# Patient Record
Sex: Female | Born: 1965 | Race: White | Hispanic: No | Marital: Married | Smoking: Never smoker
Health system: Southern US, Community
[De-identification: ages and names within clinical notes are randomized; demographics above are authoritative.]

## PROBLEM LIST (undated history)

## (undated) DIAGNOSIS — K5900 Constipation, unspecified: Secondary | ICD-10-CM

## (undated) DIAGNOSIS — F32A Depression, unspecified: Secondary | ICD-10-CM

## (undated) DIAGNOSIS — K219 Gastro-esophageal reflux disease without esophagitis: Secondary | ICD-10-CM

## (undated) DIAGNOSIS — I10 Essential (primary) hypertension: Secondary | ICD-10-CM

## (undated) DIAGNOSIS — D649 Anemia, unspecified: Secondary | ICD-10-CM

## (undated) DIAGNOSIS — J45909 Unspecified asthma, uncomplicated: Secondary | ICD-10-CM

## (undated) DIAGNOSIS — J986 Disorders of diaphragm: Secondary | ICD-10-CM

## (undated) DIAGNOSIS — T7840XA Allergy, unspecified, initial encounter: Secondary | ICD-10-CM

## (undated) DIAGNOSIS — G43909 Migraine, unspecified, not intractable, without status migrainosus: Secondary | ICD-10-CM

## (undated) DIAGNOSIS — F329 Major depressive disorder, single episode, unspecified: Secondary | ICD-10-CM

## (undated) HISTORY — DX: Depression, unspecified: F32.A

## (undated) HISTORY — DX: Anemia, unspecified: D64.9

## (undated) HISTORY — DX: Unspecified asthma, uncomplicated: J45.909

## (undated) HISTORY — PX: DILATION AND CURETTAGE OF UTERUS: SHX78

## (undated) HISTORY — PX: WISDOM TOOTH EXTRACTION: SHX21

## (undated) HISTORY — DX: Essential (primary) hypertension: I10

## (undated) HISTORY — DX: Major depressive disorder, single episode, unspecified: F32.9

## (undated) HISTORY — PX: APPENDECTOMY: SHX54

## (undated) HISTORY — DX: Migraine, unspecified, not intractable, without status migrainosus: G43.909

## (undated) HISTORY — DX: Constipation, unspecified: K59.00

## (undated) HISTORY — DX: Allergy, unspecified, initial encounter: T78.40XA

---

## 2007-04-25 HISTORY — PX: TENNIS ELBOW RELEASE/NIRSCHEL PROCEDURE: SHX6651

## 2011-04-25 HISTORY — PX: KNEE ARTHROSCOPY: SUR90

## 2013-06-23 DIAGNOSIS — Z8249 Family history of ischemic heart disease and other diseases of the circulatory system: Secondary | ICD-10-CM | POA: Insufficient documentation

## 2013-06-23 DIAGNOSIS — E785 Hyperlipidemia, unspecified: Secondary | ICD-10-CM | POA: Insufficient documentation

## 2013-06-23 DIAGNOSIS — R079 Chest pain, unspecified: Secondary | ICD-10-CM | POA: Insufficient documentation

## 2013-06-23 DIAGNOSIS — G43909 Migraine, unspecified, not intractable, without status migrainosus: Secondary | ICD-10-CM | POA: Insufficient documentation

## 2013-06-23 DIAGNOSIS — F32A Depression, unspecified: Secondary | ICD-10-CM | POA: Insufficient documentation

## 2017-11-27 ENCOUNTER — Encounter: Payer: Self-pay | Admitting: Family Medicine

## 2017-12-19 ENCOUNTER — Encounter: Payer: Self-pay | Admitting: Internal Medicine

## 2017-12-19 ENCOUNTER — Ambulatory Visit: Payer: Self-pay

## 2017-12-26 ENCOUNTER — Encounter: Payer: Self-pay | Admitting: Family Medicine

## 2018-01-07 ENCOUNTER — Encounter: Payer: Self-pay | Admitting: Allergy and Immunology

## 2018-01-07 ENCOUNTER — Ambulatory Visit (INDEPENDENT_AMBULATORY_CARE_PROVIDER_SITE_OTHER): Payer: 59 | Admitting: Allergy and Immunology

## 2018-01-07 VITALS — BP 124/86 | HR 95 | Resp 16 | Ht 65.5 in | Wt 286.4 lb

## 2018-01-07 DIAGNOSIS — J310 Chronic rhinitis: Secondary | ICD-10-CM | POA: Diagnosis not present

## 2018-01-07 DIAGNOSIS — K219 Gastro-esophageal reflux disease without esophagitis: Secondary | ICD-10-CM | POA: Diagnosis not present

## 2018-01-07 DIAGNOSIS — J4540 Moderate persistent asthma, uncomplicated: Secondary | ICD-10-CM | POA: Insufficient documentation

## 2018-01-07 MED ORDER — PANTOPRAZOLE SODIUM 40 MG PO TBEC: 40.0000 mg | DELAYED_RELEASE_TABLET | Freq: Every day | ORAL | 5 refills | Status: DC

## 2018-01-07 MED ORDER — TIOTROPIUM BROMIDE MONOHYDRATE 1.25 MCG/ACT IN AERS: 2.0000 | INHALATION_SPRAY | Freq: Every day | RESPIRATORY_TRACT | 5 refills | Status: DC

## 2018-01-07 MED ORDER — BUDESONIDE-FORMOTEROL FUMARATE 160-4.5 MCG/ACT IN AERO: 2.0000 | INHALATION_SPRAY | Freq: Two times a day (BID) | RESPIRATORY_TRACT | 5 refills | Status: DC

## 2018-01-07 MED ORDER — FLUTICASONE PROPIONATE 93 MCG/ACT NA EXHU: 2.0000 | INHALANT_SUSPENSION | Freq: Two times a day (BID) | NASAL | 3 refills | Status: DC

## 2018-01-07 MED ORDER — MONTELUKAST SODIUM 10 MG PO TABS: 10.0000 mg | ORAL_TABLET | Freq: Every day | ORAL | 4 refills | Status: DC

## 2018-01-07 MED ORDER — ALBUTEROL SULFATE 108 (90 BASE) MCG/ACT IN AEPB: 2.0000 | INHALATION_SPRAY | RESPIRATORY_TRACT | 2 refills | Status: DC | PRN

## 2018-01-07 MED ORDER — CARBINOXAMINE MALEATE 4 MG PO TABS: 1.0000 | ORAL_TABLET | Freq: Three times a day (TID) | ORAL | 4 refills | Status: DC

## 2018-01-07 MED ORDER — RANITIDINE HCL 150 MG PO TABS: 150.0000 mg | ORAL_TABLET | Freq: Two times a day (BID) | ORAL | 5 refills | Status: DC

## 2018-01-07 NOTE — Assessment & Plan Note (Addendum)
Currently with suboptimal control.  For now, continue pantoprazole (Protonix) as prescribed.  A prescription has been provided for ranitidine 150 mg twice daily.  Appropriate reflux lifestyle modifications have been discussed and provided in written form.

## 2018-01-07 NOTE — Progress Notes (Signed)
New Patient Note  RE: Alyssa Clay MRN: [id number] DOB: 26-Feb-1966 Date of Office Visit: 01/07/2018  Referring provider: No ref. provider found Primary care provider: Via, Lennette Bihari, MD  Chief Complaint: Asthma; Sinus Problem; and Allergic Rhinitis    History of present illness: Alyssa Clay is a 52 y.o. female presenting today for evaluation of rhinitis and asthma. She reports that in late December 2018 she was hospitalized for 1 week for an asthma exacerbation.  She did not require intubation.  She was started on Brio Ellipta 200-25 g, 1 inhalation daily, and montelukast 10 mg daily at bedtime.  While on this regimen she has rarely required albuterol rescue and denies nocturnal awakenings due to lower respiratory symptoms.  She has had asthma-like symptoms since childhood and asthma was formally diagnosed approximately 20 years ago.  Her asthma symptoms include cold weather, upper respiratory tract infections, pollen exposure, and exercise.  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  She experiences nasal congestion, sinus pressure between the eyes, thick postnasal drainage, and "scratchy, scratchy throat".  These symptoms occur year-round but seem to be more frequent and severe in the springtime and in the fall.  When she moved to [place] from [place] she got rid of her cat based upon results of previous skin testing.  She has tried Flonase, Astelin, Xyzal, Periactin, and Claritin without adequate symptom relief.  She does not like the taste of Astelin nasal spray.  Assessment and plan: Moderate persistent asthma Todays spirometry results, assessed while asymptomatic, suggest under-perception of bronchoconstriction.  A prescription has been provided for Symbicort (budesonide/formoterol) 160/4.5 g,  2 inhalations twice a day. To maximize pulmonary deposition, a spacer has been provided along with instructions for its proper administration with an HFA inhaler.  A prescription  has been provided for Spiriva Respimat 1.25 g, 2 inhalations daily.  For now, continue montelukast 10 mg daily at bedtime and albuterol HFA, 1 to 2 inhalations every 4-6 hours if needed.  Subjective and objective measures of pulmonary function will be followed and the treatment plan will be adjusted accordingly.  Chronic rhinitis All seasonal and perennial aeroallergen skin tests are negative despite a positive histamine control.  Intranasal steroids, intranasal antihistamines, and first generation antihistamines are effective for symptoms associated with non-allergic rhinitis, whereas second generation antihistamines such as cetirizine (Zyrtec), loratadine (Claritin) and fexofenadine (Allegra) have been found to be ineffective for this condition.  As the patient has had positive skin test in the past to cat, dog, mold, and dust mite, we will confirm today's skin test results with serum specific IgE.  Laboratory order form has been provided for serum specific IgE against zone 2 environmental panel.  A prescription has been provided for [facility], 2 actuations per nostril twice a day. Proper technique has been discussed and demonstrated.  Nasal saline lavage (NeilMed) has been recommended as needed and prior to medicated nasal sprays along with instructions for proper administration.  A prescription has been provided for carbinoxamine maleate 4mg  every 8 hours as needed.  GERD (gastroesophageal reflux disease) Currently with suboptimal control.  For now, continue pantoprazole (Protonix) as prescribed.  A prescription has been provided for ranitidine 150 mg twice daily.  Appropriate reflux lifestyle modifications have been discussed and provided in written form.   Meds ordered this encounter  Medications  . budesonide-formoterol (SYMBICORT) 160-4.5 MCG/ACT inhaler    Sig: Inhale 2 puffs into the lungs 2 (two) times daily.    Dispense:  1 Inhaler  Refill:  5  . Tiotropium Bromide  Monohydrate (SPIRIVA RESPIMAT) 1.25 MCG/ACT AERS    Sig: Inhale 2 puffs into the lungs daily.    Dispense:  4 g    Refill:  5  . DISCONTD: Fluticasone Propionate (XHANCE) 93 MCG/ACT EXHU    Sig: Place 2 sprays into the nose 2 (two) times daily.    Dispense:  16 mL    Refill:  3  . Carbinoxamine Maleate 4 MG TABS    Sig: Take 1 tablet (4 mg total) by mouth every 8 (eight) hours.    Dispense:  28 each    Refill:  4  . ranitidine (ZANTAC) 150 MG tablet    Sig: Take 1 tablet (150 mg total) by mouth 2 (two) times daily.    Dispense:  60 tablet    Refill:  5  . pantoprazole (PROTONIX) 40 MG tablet    Sig: Take 1 tablet (40 mg total) by mouth daily.    Dispense:  30 tablet    Refill:  5  . montelukast (SINGULAIR) 10 MG tablet    Sig: Take 1 tablet (10 mg total) by mouth daily.    Dispense:  30 tablet    Refill:  4  . Albuterol Sulfate 108 (90 Base) MCG/ACT AEPB    Sig: Inhale 2 puffs into the lungs as needed.    Dispense:  1 each    Refill:  2  . Fluticasone Propionate (XHANCE) 93 MCG/ACT EXHU    Sig: Place 2 sprays into the nose 2 (two) times daily.    Dispense:  16 mL    Refill:  3    [phone]    Diagnostics: Spirometry: FVC was 1.84 L and FEV1 was 1.42 L (49% predicted) with significant (790 mL, 56%) postbronchodilator improvement.  This study was performed while the patient was asymptomatic.  Please see scanned spirometry results for details. Epicutaneous testing: Negative despite a positive histamine control. Intradermal testing: Negative despite a positive histamine control.    Physical examination: Blood pressure 124/86, pulse 95, resp. rate 16, height 5' 5.5" (1.664 m), weight 286 lb 6.4 oz (129.9 kg), last menstrual period 12/17/2017, SpO2 100 %.  General: Alert, interactive, in no acute distress. HEENT: TMs pearly gray, turbinates edematous without discharge, post-pharynx moderately erythematous. Neck: Supple without lymphadenopathy. Lungs: Clear to auscultation  without wheezing, rhonchi or rales. CV: Normal S1, S2 without murmurs. Abdomen: Nondistended, nontender. Skin: Warm and dry, without lesions or rashes. Extremities:  No clubbing, cyanosis or edema. Neuro:   Grossly intact.  Review of systems:  Review of systems negative except as noted in HPI / PMHx or noted below: Review of Systems  Constitutional: Negative.   HENT: Negative.   Eyes: Negative.   Respiratory: Negative.   Cardiovascular: Negative.   Gastrointestinal: Negative.   Genitourinary: Negative.   Musculoskeletal: Negative.   Skin: Negative.   Neurological: Negative.   Endo/Heme/Allergies: Negative.   Psychiatric/Behavioral: Negative.     Past medical history:  Past Medical History:  Diagnosis Date  . Asthma   . Depression   . Hypertension   . Migraine     Past surgical history:  Reviewed.  No pertinent surgical history reported.  Family history: No significant or contributory family history has been reported.  Social history: Social History   Socioeconomic History  . Marital status: Married    Spouse name: Not on file  . Number of children: Not on file  . Years of education: Not on file  .  Highest education level: Not on file  Occupational History  . Not on file  Social Needs  . Financial resource strain: Not on file  . Food insecurity:    Worry: Not on file    Inability: Not on file  . Transportation needs:    Medical: Not on file    Non-medical: Not on file  Tobacco Use  . Smoking status: Never Smoker  . Smokeless tobacco: Never Used  Substance and Sexual Activity  . Alcohol use: Yes    Frequency: Never    Comment: occ  . Drug use: Never  . Sexual activity: Not on file  Lifestyle  . Physical activity:    Days per week: Not on file    Minutes per session: Not on file  . Stress: Not on file  Relationships  . Social connections:    Talks on phone: Not on file    Gets together: Not on file    Attends religious service: Not on file     Active member of club or organization: Not on file    Attends meetings of clubs or organizations: Not on file    Relationship status: Not on file  . Intimate partner violence:    Fear of current or ex partner: Not on file    Emotionally abused: Not on file    Physically abused: Not on file    Forced sexual activity: Not on file  Other Topics Concern  . Not on file  Social History Narrative  . Not on file   Environmental History: The patient lives in a house built in [place] with hardwood floors throughout and central air/heat.  She is a non-smoker without pets. She was exposed to second hand cigarette smoke in the home when she was growing up  There is no known mold/water damage in the home.  Allergies as of 01/07/2018      Reactions   Penicillins Other (See Comments)   Yeast infections Yeast infections      Medication List        Accurate as of 01/07/18 12:30 PM. Always use your most recent med list.          AIMOVIG 70 MG/ML Soaj Generic drug:  Erenumab-aooe Inject into the skin.   albuterol (2.5 MG/3ML) 0.083% nebulizer solution Commonly known as:  PROVENTIL Take 2.5 mg by nebulization every 6 (six) hours as needed for wheezing or shortness of breath.   Albuterol Sulfate 108 (90 Base) MCG/ACT Aepb Inhale 2 puffs into the lungs as needed.   BREO ELLIPTA 200-25 MCG/INH Aepb Generic drug:  fluticasone furoate-vilanterol Inhale 1 puff into the lungs daily.   budesonide-formoterol 160-4.5 MCG/ACT inhaler Commonly known as:  SYMBICORT Inhale 2 puffs into the lungs 2 (two) times daily.   calcium carbonate 1500 (600 Ca) MG Tabs tablet Commonly known as:  OSCAL Take 600 mg of elemental calcium by mouth 2 (two) times daily with a meal.   Carbinoxamine Maleate 4 MG Tabs Take 1 tablet (4 mg total) by mouth every 8 (eight) hours.   cyclobenzaprine 10 MG tablet Commonly known as:  FLEXERIL Take 10 mg by mouth 3 (three) times daily as needed for muscle spasms.   DULoxetine  60 MG capsule Commonly known as:  CYMBALTA Take 60 mg by mouth daily.   Fluticasone Propionate 93 MCG/ACT Exhu Place 2 sprays into the nose 2 (two) times daily.   gabapentin 300 MG capsule Commonly known as:  NEURONTIN   lisinopril 20 MG tablet  Commonly known as:  PRINIVIL,ZESTRIL Take 20 mg by mouth daily.   MAXALT 10 MG tablet Generic drug:  rizatriptan Take 10 mg by mouth as needed for migraine. May repeat in 2 hours if needed   MIGRELIEF 200-180-50 MG Tabs Generic drug:  Riboflavin-Magnesium-Feverfew Take by mouth.   montelukast 10 MG tablet Commonly known as:  SINGULAIR Take 1 tablet (10 mg total) by mouth daily.   multivitamin with minerals Tabs tablet Take 1 tablet by mouth daily.   pantoprazole 40 MG tablet Commonly known as:  PROTONIX Take 1 tablet (40 mg total) by mouth daily.   PROBIOTIC PO Take 1 capsule by mouth daily.   promethazine 25 MG tablet Commonly known as:  PHENERGAN Take 25 mg by mouth every 6 (six) hours as needed for nausea or vomiting.   ranitidine 150 MG tablet Commonly known as:  ZANTAC Take 1 tablet (150 mg total) by mouth 2 (two) times daily.   Tiotropium Bromide Monohydrate 1.25 MCG/ACT Aers Inhale 2 puffs into the lungs daily.   traZODone 100 MG tablet Commonly known as:  DESYREL Take 100 mg by mouth at bedtime.   valACYclovir 1000 MG tablet Commonly known as:  VALTREX Take 1,000 mg by mouth 2 (two) times daily.       Known medication allergies: Allergies  Allergen Reactions  . Penicillins Other (See Comments)    Yeast infections Yeast infections     I appreciate the opportunity to take part in [facility] care. Please do not hesitate to contact me with questions.  Sincerely,   R. Edgar Frisk, MD

## 2018-01-07 NOTE — Assessment & Plan Note (Signed)
Todays spirometry results, assessed while asymptomatic, suggest under-perception of bronchoconstriction.  A prescription has been provided for Symbicort (budesonide/formoterol) 160/4.5 g, 2 inhalations twice a day. To maximize pulmonary deposition, a spacer has been provided along with instructions for its proper administration with an HFA inhaler.  A prescription has been provided for Spiriva Respimat 1.25 g, 2 inhalations daily.  For now, continue montelukast 10 mg daily at bedtime and albuterol HFA, 1 to 2 inhalations every 4-6 hours if needed.  Subjective and objective measures of pulmonary function will be followed and the treatment plan will be adjusted accordingly.

## 2018-01-07 NOTE — Patient Instructions (Addendum)
Moderate persistent asthma Todays spirometry results, assessed while asymptomatic, suggest under-perception of bronchoconstriction.  A prescription has been provided for Symbicort (budesonide/formoterol) 160/4.5 g,  2 inhalations twice a day. To maximize pulmonary deposition, a spacer has been provided along with instructions for its proper administration with an HFA inhaler.  A prescription has been provided for Spiriva Respimat 1.25 g, 2 inhalations daily.  For now, continue montelukast 10 mg daily at bedtime and albuterol HFA, 1 to 2 inhalations every 4-6 hours if needed.  Subjective and objective measures of pulmonary function will be followed and the treatment plan will be adjusted accordingly.  Chronic rhinitis All seasonal and perennial aeroallergen skin tests are negative despite a positive histamine control.  Intranasal steroids, intranasal antihistamines, and first generation antihistamines are effective for symptoms associated with non-allergic rhinitis, whereas second generation antihistamines such as cetirizine (Zyrtec), loratadine (Claritin) and fexofenadine (Allegra) have been found to be ineffective for this condition.  As the patient has had positive skin test in the past to cat, dog, mold, and dust mite, we will confirm today's skin test results with serum specific IgE.  Laboratory order form has been provided for serum specific IgE against zone 2 environmental panel.  A prescription has been provided for [facility], 2 actuations per nostril twice a day. Proper technique has been discussed and demonstrated.  Nasal saline lavage (NeilMed) has been recommended as needed and prior to medicated nasal sprays along with instructions for proper administration.  A prescription has been provided for carbinoxamine maleate 4mg  every 8 hours as needed.  GERD (gastroesophageal reflux disease) Currently with suboptimal control.  For now, continue pantoprazole (Protonix) as  prescribed.  A prescription has been provided for ranitidine 150 mg twice daily.  Appropriate reflux lifestyle modifications have been discussed and provided in written form.   Return in about 3 months (around 04/08/2018), or if symptoms worsen or fail to improve.  Lifestyle Changes for Controlling GERD  When you have GERD, stomach acid feels as if it's backing up toward your mouth. Whether or not you take medication to control your GERD, your symptoms can often be improved with lifestyle changes.   Raise Your Head  Reflux is more likely to strike when you're lying down flat, because stomach fluid can  flow backward more easily. Raising the head of your bed 4-6 inches can help. To do this:  Slide blocks or books under the legs at the head of your bed. Or, place a wedge under  the mattress. Many foam stores can make a suitable wedge for you. The wedge  should run from your waist to the top of your head.  Don't just prop your head on several pillows. This increases pressure on your  stomach. It can make GERD worse.  Watch Your Eating Habits Certain foods may increase the acid in your stomach or relax the lower esophageal sphincter, making GERD more likely. It's best to avoid the following:  Coffee, tea, and carbonated drinks (with and without caffeine)  Fatty, fried, or spicy food  Mint, chocolate, onions, and tomatoes  Any other foods that seem to irritate your stomach or cause you pain  Relieve the Pressure  Eat smaller meals, even if you have to eat more often.  Don't lie down right after you eat. Wait a few hours for your stomach to empty.  Avoid tight belts and tight-fitting clothes.  Lose excess weight.  Tobacco and Alcohol  Avoid smoking tobacco and drinking alcohol. They can make GERD symptoms worse.

## 2018-01-07 NOTE — Assessment & Plan Note (Addendum)
All seasonal and perennial aeroallergen skin tests are negative despite a positive histamine control.  Intranasal steroids, intranasal antihistamines, and first generation antihistamines are effective for symptoms associated with non-allergic rhinitis, whereas second generation antihistamines such as cetirizine (Zyrtec), loratadine (Claritin) and fexofenadine (Allegra) have been found to be ineffective for this condition.  As the patient has had positive skin test in the past to cat, dog, mold, and dust mite, we will confirm today's skin test results with serum specific IgE.  Laboratory order form has been provided for serum specific IgE against zone 2 environmental panel.  A prescription has been provided for [facility], 2 actuations per nostril twice a day. Proper technique has been discussed and demonstrated.  Nasal saline lavage (NeilMed) has been recommended as needed and prior to medicated nasal sprays along with instructions for proper administration.  A prescription has been provided for carbinoxamine maleate 4mg  every 8 hours as needed.

## 2018-01-10 ENCOUNTER — Institutional Professional Consult (permissible substitution): Payer: Self-pay | Admitting: Internal Medicine

## 2018-01-10 LAB — ALLERGENS W/TOTAL IGE AREA 2

## 2018-01-23 ENCOUNTER — Encounter: Payer: Self-pay | Admitting: Registered"

## 2018-01-23 ENCOUNTER — Encounter: Payer: 59 | Attending: Family Medicine | Admitting: Registered"

## 2018-01-23 DIAGNOSIS — Z6841 Body Mass Index (BMI) 40.0 and over, adult: Secondary | ICD-10-CM | POA: Insufficient documentation

## 2018-01-23 DIAGNOSIS — E669 Obesity, unspecified: Secondary | ICD-10-CM | POA: Insufficient documentation

## 2018-01-23 DIAGNOSIS — Z713 Dietary counseling and surveillance: Secondary | ICD-10-CM | POA: Insufficient documentation

## 2018-01-23 NOTE — Progress Notes (Signed)
Medical Nutrition Therapy:  Appt start time: 1113 end time:  1220.  Assessment:  Primary concerns today: Pt referred for weight management. Pt reports that she gained 40 lb this past December due to illness. Pt reports she has tried diets in the past such as the [place] but was unable to maintain it. She reports her last lab work came back in the prediabetes range with HgbA1c of 6.4. Pt would like information on how to prevent development of type 2 diabetes. Pt reports that she is currently on an elimination diet to assess if certain foods may be contributing to migraines.  She has been avoiding nuts, chocolate, white bread/refined grains, and processed foods as part of elimination diet. Pt reports she has one more week until she starts adding foods back in to diet. Pt reports that she does not drink soft drinks. Mainly drinks decaffeinated coffee, water, sometimes beer. Usually gets between 3-8 hours of sleep per night. Reports that her breathing difficulties make getting in adequate sleep difficult sometimes.   Preferred Learning Style:   No preference indicated   Learning Readiness:   Ready  MEDICATIONS: See list. Reviewed.    DIETARY INTAKE:  Usual eating pattern includes 3 meals and 2 snacks per day. Fruit, popcorn, wheat crackers and cheese. Meals eaten at home are usually eaten at the table, sometimes breakfast may be eaten in front of TV.    Common foods include decaffeinated coffee, berries, popcorn, scramble eggs, oatmeal.  Avoided foods include avoiding nuts, chocolate, white bread/refined grains, and processed foods to temporarily to assess if they could be causing migraines; yogurt, milk, onions, most vegetables. Likes spinach, peas, green beans, cooked carrots, asparagus, broccoli, sweet potatoes.      24-hr recall:  B ( AM): 2 scrambled eggs, 1 piece of whole wheat toast, 1 cup strawberries/blueberries, 2 cups of decaffeinated coffee and creamer  Snk ( AM): None  reported.  L ( PM): hamburger without bun, lettuce, cherry tomatoes, champagne dressing, water   Snk ( PM): 6 wheat crackers and 3 pieces of cheese, water   D ( PM): tilapia, green beans, water  Snk ( PM): homemade popcorn with olive oil and sea salt  Beverages: typically has 6-8 glasses of water daily; 2 cups decaffeinated coffee with creamer  Usual physical activity: Average of walking 10 minutes about 3 times per week. Plans to start including more physical activity since getting inhaler adjusted.   Progress Towards Goal(s):  In progress.   Nutritional Diagnosis:  NB-1.1 Food and nutrition-related knowledge deficit As related to relationship between blood sugar management/insulin resistance and dietary intake.  As evidenced by pt has questions regarding how she should eat to help prevent development of diabetes.    Intervention:  Nutrition counseling provided. Dietitian provided education regarding the relationship between blood sugar management/insulin resistance and dietary intake. Provided education on balanced nutrition and benefits of physical activity on blood sugar management. Discussed carbohydrate counting as a way to ensure we are including a consistent amount of carbohydrates at meals as pt was unsure about having carbohydrates at meals. Worked with pt to set a short-term physical activity goal. Pt appeared agreeable to information/goals discussed.   Instructions/Goals: Make sure to get in three meals per day. Try to have balanced meals like the My Plate example (see handout). Include lean proteins, vegetables, fruits, and whole grains at meals.   Include a consistent amount of carbohydrates at each meal. Recommend around 2-3 carbohydrate choices per meal (30-45 g  per meal).   Continue staying well hydrated. Recommend at least 64 oz water per day.   Make physical activity a part of your week. Try to include at least 30 minutes of physical activity 5 days each week or at least 150  minutes per week. Regular physical activity promotes overall health-including helping to reduce risk for heart disease and diabetes, promoting mental health, and helping [place] sleep better.    Starting Goal: At least 15 minutes 3-4 days per week.   Teaching Method Utilized:  Visual Auditory  Handouts given during visit include:  Balanced plate and food list.   Carbohydrate Counting Card  Snack Sheet  Barriers to learning/adherence to lifestyle change: None indicated.   Demonstrated degree of understanding via:  Teach Back   Monitoring/Evaluation:  Dietary intake, exercise, and body weight in 1 month(s).

## 2018-01-23 NOTE — Patient Instructions (Signed)
Instructions/Goals: Make sure to get in three meals per day. Try to have balanced meals like the My Plate example (see handout). Include lean proteins, vegetables, fruits, and whole grains at meals.   Include a consistent amount of carbohydrates at each meal. Recommend around 2-3 carbohydrate choices per meal (30-45 g per meal).   Continue staying well hydrated. Recommend at least 64 oz water per day.   Make physical activity a part of your week. Try to include at least 30 minutes of physical activity 5 days each week or at least 150 minutes per week. Regular physical activity promotes overall health-including helping to reduce risk for heart disease and diabetes, promoting mental health, and helping [place] sleep better.    Starting Goal: At least 15 minutes 3-4 days per week.

## 2018-01-24 ENCOUNTER — Ambulatory Visit (INDEPENDENT_AMBULATORY_CARE_PROVIDER_SITE_OTHER): Payer: 59 | Admitting: Family Medicine

## 2018-01-24 ENCOUNTER — Encounter: Payer: Self-pay | Admitting: Family Medicine

## 2018-01-24 VITALS — BP 130/78 | HR 105 | Temp 98.2°F | Ht 66.0 in | Wt 285.8 lb

## 2018-01-24 DIAGNOSIS — J986 Disorders of diaphragm: Secondary | ICD-10-CM

## 2018-01-24 DIAGNOSIS — Z13220 Encounter for screening for lipoid disorders: Secondary | ICD-10-CM

## 2018-01-24 DIAGNOSIS — Z23 Encounter for immunization: Secondary | ICD-10-CM | POA: Diagnosis not present

## 2018-01-24 DIAGNOSIS — R7301 Impaired fasting glucose: Secondary | ICD-10-CM

## 2018-01-24 DIAGNOSIS — D72829 Elevated white blood cell count, unspecified: Secondary | ICD-10-CM | POA: Diagnosis not present

## 2018-01-24 DIAGNOSIS — K219 Gastro-esophageal reflux disease without esophagitis: Secondary | ICD-10-CM | POA: Diagnosis not present

## 2018-01-24 DIAGNOSIS — Z1211 Encounter for screening for malignant neoplasm of colon: Secondary | ICD-10-CM | POA: Diagnosis not present

## 2018-01-24 MED ORDER — ALBUTEROL SULFATE 108 (90 BASE) MCG/ACT IN AEPB: 2.0000 | INHALATION_SPRAY | RESPIRATORY_TRACT | 5 refills | Status: DC | PRN

## 2018-01-24 NOTE — Progress Notes (Signed)
Alyssa Clay DOB: [birth year] Encounter date: 01/24/2018  This isa 52 y.o. female who presents to establish care. Chief Complaint  Patient presents with  . New Patient (Initial Visit)    prediabetic at previous pcp, blood work?    History of present illness: A1C 6.4; TSH 1.25;   Fasting glucose 90; BUN/Crea 8/0.81 electtrolytes normal; protein, liver normal WBC 12.6  RF negative CRP negative/normal ANA negative Sed rate 21  Worried about prediabetes. Has cut out processed sugar. Wanting to lose weight. Doing more whole grains, veggies, fruits. On scale at home has lost 6-7 lbs. Just started exercising last week. Started doing ellyptical, stationary bike, treadmill.   Has asthma: When she gets sick it hits her very hard. Goes right to lungs. Parents smoked in house when she was little. She has never smoked herself.   Migraines: Started aimovig which helps; following migraine diet. Avoiding triggers.   Just saw allergist and got more inhalers/asthma treatment.   Has had high blood pressure since 20's. Does check bp at home. Usually runs around 130/78 (similar to today).   GERD: Still symptomatic even with dietary changes. Takes protonix regularly. Doing both zantac and protonix. Past couple of days has seemed to flare more. Has not seen a GI specialist.   Depression: Not best relationship with mom. Mood worse in last 5 years; worked with therapist which really helped her. Biggest help was setting limits with mom. Also happier to be in [place] area. Takes cymbalta for mood.   Would like to sleep better. Does well on the trazodone. Minimal snoring. No one has ever told her she stops breathing. When not coughing all night sleeps better.     Past Medical History:  Diagnosis Date  . Asthma   . Depression   . Hypertension   . Migraine    Past Surgical History:  Procedure Laterality Date  . CESAREAN SECTION  1994  . DILATION AND CURETTAGE OF UTERUS     multiple  due to miscarriages  . KNEE ARTHROSCOPY Left 2013  . TENNIS ELBOW RELEASE/NIRSCHEL PROCEDURE  2009   No Active Allergies Current Meds  Medication Sig  . albuterol (PROVENTIL) (2.5 MG/3ML) 0.083% nebulizer solution Take 2.5 mg by nebulization every 6 (six) hours as needed for wheezing or shortness of breath.  . budesonide-formoterol (SYMBICORT) 160-4.5 MCG/ACT inhaler Inhale 2 puffs into the lungs 2 (two) times daily.  . calcium carbonate (OSCAL) 1500 (600 Ca) MG TABS tablet Take 600 mg of elemental calcium by mouth 2 (two) times daily with a meal.  . Carbinoxamine Maleate 4 MG TABS Take 1 tablet (4 mg total) by mouth every 8 (eight) hours.  . cyclobenzaprine (FLEXERIL) 10 MG tablet Take 10 mg by mouth 3 (three) times daily as needed for muscle spasms.  . DULoxetine (CYMBALTA) 60 MG capsule Take 60 mg by mouth daily.  Eduard Roux (AIMOVIG) 70 MG/ML SOAJ Inject into the skin.  Marland Kitchen Fluticasone Propionate (XHANCE) 93 MCG/ACT EXHU Place 2 sprays into the nose 2 (two) times daily.  Marland Kitchen gabapentin (NEURONTIN) 300 MG capsule   . lisinopril (PRINIVIL,ZESTRIL) 20 MG tablet Take 20 mg by mouth daily.  . montelukast (SINGULAIR) 10 MG tablet Take 1 tablet (10 mg total) by mouth daily.  . Multiple Vitamin (MULTIVITAMIN WITH MINERALS) TABS tablet Take 1 tablet by mouth daily.  . pantoprazole (PROTONIX) 40 MG tablet Take 1 tablet (40 mg total) by mouth daily.  . Probiotic Product (PROBIOTIC PO) Take 1 capsule by mouth daily.  Marland Kitchen  ranitidine (ZANTAC) 150 MG tablet Take 1 tablet (150 mg total) by mouth 2 (two) times daily.  . Riboflavin-Magnesium-Feverfew (MIGRELIEF) 200-180-50 MG TABS Take by mouth.  . rizatriptan (MAXALT) 10 MG tablet Take 10 mg by mouth as needed for migraine. May repeat in 2 hours if needed  . Tiotropium Bromide Monohydrate (SPIRIVA RESPIMAT) 1.25 MCG/ACT AERS Inhale 2 puffs into the lungs daily.  . traZODone (DESYREL) 100 MG tablet Take 100 mg by mouth at bedtime.  . triamcinolone cream  (KENALOG) 0.1 % Apply 1 application topically daily as needed.  . valACYclovir (VALTREX) 1000 MG tablet Take 1,000 mg by mouth 2 (two) times daily.   Social History   Tobacco Use  . Smoking status: Never Smoker  . Smokeless tobacco: Never Used  Substance Use Topics  . Alcohol use: Yes    Frequency: Never    Comment: occ   Family History  Problem Relation Age of Onset  . Cancer Mother 64       lymphoma  . Depression Mother   . Hypertension Father   . Heart attack Father 4  . Hypertension Brother   . High blood pressure Brother   . High Cholesterol Brother      Review of Systems  Constitutional: Negative for chills, fatigue and fever.  HENT: Positive for congestion (difficult clearing congestion).   Respiratory: Negative for cough, chest tightness, shortness of breath and wheezing (stable right now).   Cardiovascular: Negative for chest pain, palpitations and leg swelling.  Psychiatric/Behavioral: Positive for sleep disturbance.    Objective:  BP 130/78 (BP Location: Left Arm, Patient Position: Sitting, Cuff Size: Large)   Pulse (!) 105   Temp 98.2 F (36.8 C) (Oral)   Ht 5\' 6"  (1.676 m)   Wt 285 lb 12.8 oz (129.6 kg)   SpO2 94%   BMI 46.13 kg/m   Weight: 285 lb 12.8 oz (129.6 kg)   BP Readings from Last 3 Encounters:  01/24/18 130/78  01/07/18 124/86   Wt Readings from Last 3 Encounters:  01/24/18 285 lb 12.8 oz (129.6 kg)  01/23/18 286 lb 11.2 oz (130 kg)  01/07/18 286 lb 6.4 oz (129.9 kg)    Physical Exam  Constitutional: She is oriented to person, place, and time. She appears well-developed and well-nourished. No distress.  Cardiovascular: Normal rate, regular rhythm and normal heart sounds. Exam reveals no friction rub.  No murmur heard. No lower extremity edema  Pulmonary/Chest: Effort normal and breath sounds normal. No respiratory distress. She has no wheezes. She has no rales.  Abdominal: Soft. Bowel sounds are normal. She exhibits no distension.  There is no tenderness.  Neurological: She is alert and oriented to person, place, and time.  Psychiatric: Her behavior is normal. Cognition and memory are normal.    Assessment/Plan: 1. Gastroesophageal reflux disease, esophagitis presence not specified Uncontrolled on duel therapy. Will get GI opinion. - Ambulatory referral to Gastroenterology  2. Screening for colon cancer - Ambulatory referral to Gastroenterology  3. Leukocytosis, unspecified type - CBC with Differential/Platelet; Future  4. Impaired fasting glucose Continue with exercise and healthier eating. Plan to recheck A1C in 2 months time. - Hemoglobin A1c; Future  5. Diaphragm dysfunction Chronic condition.   6. Lipid screening - Lipid panel; Future  7. Need for pneumococcal vaccination Completed today.  Return in about 2 months (around 03/26/2018).  Micheline Rough, MD

## 2018-02-06 NOTE — Progress Notes (Signed)
Alyssa Clay DOB: January 23, 1966 Encounter date: 02/07/2018  This is a 52 y.o. female who presents with Chief Complaint  Patient presents with  . Hand Pain    both hands, x3 weeks, hands and fingers, 9/10 on pain scale at some times, pt states she has had the pain on and off for a few years but it has gotten worse over the past 3 weeks    History of present illness:  In past year fingers will feel achy off and on. In last couple of weeks has had pain over back of hands that is severe 9/10. Not necessarily swelling although she does swell sometimes. Tried aleve which didn't help. Pain not worse with activity.   Pain level right now is 2/10. Coming and going. But generally present to some degree this whole time.   Knees have been achy which is new in last couple of months.   Nothing she did that was different that may have aggravated hands.   No fevers, chills, night sweats.    She does note that in past 2 years she has difficulty getting going in the morning. Overall body is somewhat achy; takes her 1-2 hours to feel like she is "fully functioning". Not limited to specific joint, just more of an overall fatigue. Attributed to weight gain.   No Known Allergies Current Meds  Medication Sig  . albuterol (PROVENTIL) (2.5 MG/3ML) 0.083% nebulizer solution Take 2.5 mg by nebulization every 6 (six) hours as needed for wheezing or shortness of breath.  . Albuterol Sulfate (PROAIR RESPICLICK) 762 (90 Base) MCG/ACT AEPB Inhale 2 puffs into the lungs every 4 (four) hours as needed.  . budesonide-formoterol (SYMBICORT) 160-4.5 MCG/ACT inhaler Inhale 2 puffs into the lungs 2 (two) times daily.  . calcium carbonate (OSCAL) 1500 (600 Ca) MG TABS tablet Take 600 mg of elemental calcium by mouth 2 (two) times daily with a meal.  . cyclobenzaprine (FLEXERIL) 10 MG tablet Take 10 mg by mouth 3 (three) times daily as needed for muscle spasms.  . DULoxetine (CYMBALTA) 60 MG capsule Take 60 mg by  mouth daily.  Eduard Roux (AIMOVIG) 70 MG/ML SOAJ Inject into the skin.  Marland Kitchen Fluticasone Propionate (XHANCE) 93 MCG/ACT EXHU Place 2 sprays into the nose 2 (two) times daily.  Marland Kitchen gabapentin (NEURONTIN) 300 MG capsule   . lisinopril (PRINIVIL,ZESTRIL) 20 MG tablet Take 20 mg by mouth daily.  . montelukast (SINGULAIR) 10 MG tablet Take 1 tablet (10 mg total) by mouth daily.  . Multiple Vitamin (MULTIVITAMIN WITH MINERALS) TABS tablet Take 1 tablet by mouth daily.  . pantoprazole (PROTONIX) 40 MG tablet Take 1 tablet (40 mg total) by mouth daily.  . Probiotic Product (PROBIOTIC PO) Take 1 capsule by mouth daily.  . ranitidine (ZANTAC) 150 MG tablet Take 1 tablet (150 mg total) by mouth 2 (two) times daily.  . Riboflavin-Magnesium-Feverfew (MIGRELIEF) 200-180-50 MG TABS Take by mouth.  . rizatriptan (MAXALT) 10 MG tablet Take 10 mg by mouth as needed for migraine. May repeat in 2 hours if needed  . Tiotropium Bromide Monohydrate (SPIRIVA RESPIMAT) 1.25 MCG/ACT AERS Inhale 2 puffs into the lungs daily.  . traZODone (DESYREL) 100 MG tablet Take 100 mg by mouth at bedtime.  . triamcinolone cream (KENALOG) 0.1 % Apply 1 application topically daily as needed.  . valACYclovir (VALTREX) 1000 MG tablet Take 1,000 mg by mouth 2 (two) times daily.  . [DISCONTINUED] Carbinoxamine Maleate 4 MG TABS Take 1 tablet (4 mg total) by mouth  every 8 (eight) hours.    Review of Systems  Constitutional: Negative for chills, fatigue and fever.  HENT: Negative for congestion.   Respiratory: Negative for cough, chest tightness, shortness of breath and wheezing.   Cardiovascular: Negative for chest pain, palpitations and leg swelling.  Musculoskeletal: Negative for arthralgias, back pain, joint swelling and myalgias.       Pain is really in bones of hands; not symmetric bones: left hand 5th metacarpal and right hand 2nd metacarpal.  Skin:       Chronic skin changes from dermatological issue; hyperpigmented  macules/scarring over forearms, hands  Hematological: Negative for adenopathy.    Objective:  BP 124/66 (BP Location: Left Arm, Patient Position: Sitting, Cuff Size: Large)   Pulse (!) 116   Temp 98.2 F (36.8 C) (Oral)   Wt 282 lb 4.8 oz (128.1 kg)   SpO2 94%   BMI 45.56 kg/m   Weight: 282 lb 4.8 oz (128.1 kg)   BP Readings from Last 3 Encounters:  02/07/18 124/66  01/24/18 130/78  01/07/18 124/86   Wt Readings from Last 3 Encounters:  02/07/18 282 lb 4.8 oz (128.1 kg)  01/24/18 285 lb 12.8 oz (129.6 kg)  01/23/18 286 lb 11.2 oz (130 kg)    Physical Exam  Constitutional: She is oriented to person, place, and time. She appears well-developed and well-nourished. No distress.  Neck: Neck supple. No thyromegaly present.  Cardiovascular: Normal rate, regular rhythm and normal heart sounds. Exam reveals no friction rub.  No murmur heard. No lower extremity edema  Pulmonary/Chest: Effort normal and breath sounds normal. No respiratory distress. She has no wheezes. She has no rales.  Musculoskeletal:  There is bony tenderness over 5th metacarpal left and 2nd right hands. There is not tenderness of joints with palpation or notable joint swelling. There is subtle edema over 5th left metacarpal when compared to right. No bruising or overlying skin change.   Lymphadenopathy:    She has no cervical adenopathy.  Neurological: She is alert and oriented to person, place, and time.  Psychiatric: Her behavior is normal. Cognition and memory are normal.    Assessment/Plan  1. Bilateral hand pain Uncertain etiology and unusual presentation. Pain is not within joints but appeared rather suddenly and with severity. She additionally has some chronic complaints that may indicate more autoimmune presence. I think worthwhile to further evaluate due to severity. She is trying to avoid anti-inflammatories due to concerns for migraine rebound headache trigger and trying to avoid steroids due to blood  sugar elevation which makes treatment more difficult. So, single depo in office today; advised to see how symptoms progress over weekend and further evaluation pending results of labs/imaging.  - CBC with Differential/Platelet; Future - Sedimentation rate; Future - C-reactive protein; Future - ANA; Future - Rheumatoid factor; Future - DG Hand Complete Left; Future - DG Hand Complete Right; Future - methylPREDNISolone acetate (DEPO-MEDROL) injection 80 mg - Rheumatoid factor - ANA - C-reactive protein - Sedimentation rate - CBC with Differential/Platelet  2. Lipid screening - waiting until December to perform due to lifestyle changes/improvement - Lipid panel  3. Impaired fasting glucose- plan to recheck in December - Hemoglobin A1c  4. Leukocytosis, unspecified type - CBC with Differential/Platelet    Return pending xray/bloodwork.     Micheline Rough, MD

## 2018-02-07 ENCOUNTER — Ambulatory Visit (INDEPENDENT_AMBULATORY_CARE_PROVIDER_SITE_OTHER): Payer: 59 | Admitting: Family Medicine

## 2018-02-07 ENCOUNTER — Encounter: Payer: Self-pay | Admitting: Family Medicine

## 2018-02-07 VITALS — BP 124/66 | HR 116 | Temp 98.2°F | Wt 282.3 lb

## 2018-02-07 DIAGNOSIS — R7301 Impaired fasting glucose: Secondary | ICD-10-CM | POA: Diagnosis not present

## 2018-02-07 DIAGNOSIS — Z13220 Encounter for screening for lipoid disorders: Secondary | ICD-10-CM | POA: Diagnosis not present

## 2018-02-07 DIAGNOSIS — M79641 Pain in right hand: Secondary | ICD-10-CM | POA: Diagnosis not present

## 2018-02-07 DIAGNOSIS — D72829 Elevated white blood cell count, unspecified: Secondary | ICD-10-CM | POA: Diagnosis not present

## 2018-02-07 DIAGNOSIS — M79642 Pain in left hand: Secondary | ICD-10-CM | POA: Diagnosis not present

## 2018-02-07 LAB — SEDIMENTATION RATE: Sed Rate: 27 mm/hr (ref 0–30)

## 2018-02-07 LAB — CBC WITH DIFFERENTIAL/PLATELET
BASOS ABS: 0.1 10*3/uL (ref 0.0–0.1)
Basophils Relative: 1.3 % (ref 0.0–3.0)
EOS ABS: 0.2 10*3/uL (ref 0.0–0.7)
Eosinophils Relative: 2.4 % (ref 0.0–5.0)
HEMATOCRIT: 43.7 % (ref 36.0–46.0)
HEMOGLOBIN: 14.3 g/dL (ref 12.0–15.0)
LYMPHS PCT: 20.4 % (ref 12.0–46.0)
Lymphs Abs: 1.7 10*3/uL (ref 0.7–4.0)
MCHC: 32.8 g/dL (ref 30.0–36.0)
MCV: 91.1 fl (ref 78.0–100.0)
Monocytes Absolute: 0.6 10*3/uL (ref 0.1–1.0)
Monocytes Relative: 7.5 % (ref 3.0–12.0)
Neutro Abs: 5.6 10*3/uL (ref 1.4–7.7)
Neutrophils Relative %: 68.4 % (ref 43.0–77.0)
PLATELETS: 354 10*3/uL (ref 150.0–400.0)
RBC: 4.79 Mil/uL (ref 3.87–5.11)
RDW: 13.8 % (ref 11.5–15.5)
WBC: 8.2 10*3/uL (ref 4.0–10.5)

## 2018-02-07 LAB — LIPID PANEL
CHOL/HDL RATIO: 5
Cholesterol: 246 mg/dL — ABNORMAL HIGH (ref 0–200)
HDL: 50.4 mg/dL (ref >39.00)
LDL CALC: 170 mg/dL — AB (ref 0–99)
NonHDL: 195.29
TRIGLYCERIDES: 126 mg/dL (ref 0.0–149.0)
VLDL: 25.2 mg/dL (ref 0.0–40.0)

## 2018-02-07 LAB — C-REACTIVE PROTEIN: CRP: 1.1 mg/dL (ref 0.5–20.0)

## 2018-02-07 LAB — HEMOGLOBIN A1C: Hgb A1c MFr Bld: 6.3 % (ref 4.6–6.5)

## 2018-02-07 MED ORDER — METHYLPREDNISOLONE ACETATE 80 MG/ML IJ SUSP
80.0000 mg | Freq: Once | INTRAMUSCULAR | Status: AC
  Administered 2018-02-07: 80 mg via INTRAMUSCULAR

## 2018-02-08 LAB — ANA: ANA: NEGATIVE

## 2018-02-08 LAB — RHEUMATOID FACTOR: Rhuematoid fact SerPl-aCnc: <14 IU/mL (ref <14)

## 2018-02-11 ENCOUNTER — Ambulatory Visit (INDEPENDENT_AMBULATORY_CARE_PROVIDER_SITE_OTHER): Payer: 59 | Admitting: Allergy

## 2018-02-11 ENCOUNTER — Encounter: Payer: Self-pay | Admitting: Family Medicine

## 2018-02-11 ENCOUNTER — Encounter: Payer: Self-pay | Admitting: Allergy

## 2018-02-11 VITALS — BP 96/62 | HR 100 | Resp 16

## 2018-02-11 DIAGNOSIS — J4540 Moderate persistent asthma, uncomplicated: Secondary | ICD-10-CM

## 2018-02-11 DIAGNOSIS — K219 Gastro-esophageal reflux disease without esophagitis: Secondary | ICD-10-CM | POA: Diagnosis not present

## 2018-02-11 DIAGNOSIS — J310 Chronic rhinitis: Secondary | ICD-10-CM | POA: Diagnosis not present

## 2018-02-11 MED ORDER — AZELASTINE HCL 0.15 % NA SOLN: 1.0000 | Freq: Two times a day (BID) | NASAL | 5 refills | Status: DC

## 2018-02-11 MED ORDER — FLUTICASONE PROPIONATE 50 MCG/ACT NA SUSP: 1.0000 | Freq: Two times a day (BID) | NASAL | 5 refills | Status: DC

## 2018-02-11 NOTE — Assessment & Plan Note (Addendum)
Interim history - coughing for the past 2-3 weeks. Albuterol sometimes helps. Concerned as last year patient was hospitalized for asthma exacerbation. No recent prednisone use for asthma.   Continue Symbicort (budesonide/formoterol) 160/4.5 g, 2 inhalations twice a day with spacer and rinse mouth afterwards.  Continue Spiriva Respimat 1.25 g, 2 inhalations daily.  For now, continue montelukast 10 mg daily at bedtime and albuterol HFA, 1 to 2 inhalations every 4-6 hours if needed.  Start prednisone taper. Let me know how you feel by the end of the week.   Discussed with patient that her coughing may be multifactorial in nature.   If coughing persistent, then consider switching lisinopril to a different medication.

## 2018-02-11 NOTE — Assessment & Plan Note (Addendum)
Currently with suboptimal control.   For now, continue pantoprazole (Protonix) as prescribed.  Continue ranitidine 150 mg twice daily.  Follow up with GI regarding possible EGD.  Continue reflux lifestyle modifications and diet.

## 2018-02-11 NOTE — Assessment & Plan Note (Addendum)
Past history - All seasonal and perennial aeroallergen skin tests are negative despite a positive histamine control.  Tried Flonase, Astelin, Xyzal, Periactin, and Claritin without adequate symptom relief Interm history - PND and coughing symptoms.  Stop Xhance as ineffective.   Start dymista 1 spray twice a day and demonstrated proper use. Insurance won't cover so sent in [place] 1 spray twice a day to use to help with PND.   Nasal saline lavage (NeilMed) has been recommended as needed and prior to medicated nasal sprays along with instructions for proper administration.  May use over the counter antihistamines such as Zyrtec (cetirizine), Claritin (loratadine), Allegra (fexofenadine), or Xyzal (levocetirizine) daily as needed.

## 2018-02-11 NOTE — Patient Instructions (Addendum)
Moderate persistent asthma Interim history - coughing for the past 2-3 weeks. Albuterol sometimes helps. Concerned as last year patient was hospitalized for asthma exacerbation. No recent prednisone use for asthma.   Continue Symbicort (budesonide/formoterol) 160/4.5 g, 2 inhalations twice a day with spacer and rinse mouth afterwards.  Continue Spiriva Respimat 1.25 g, 2 inhalations daily.  For now, continue montelukast 10 mg daily at bedtime and albuterol HFA, 1 to 2 inhalations every 4-6 hours if needed.  Start prednisone taper. Let me know how you feel by the end of the week.   Discussed with patient that her coughing may be multifactorial in nature.   If coughing persistent, then consider switching lisinopril to a different medication.  Chronic rhinitis Past history - All seasonal and perennial aeroallergen skin tests are negative despite a positive histamine control.  Tried Flonase, Astelin, Xyzal, Periactin, and Claritin without adequate symptom relief Interm history - PND and coughing symptoms.  Stop Xhance as ineffective.   Start dymista 1 spray twice a day and demonstrated proper use. Insurance won't cover so sent in [place] 1 spray twice a day to use to help with PND.   Nasal saline lavage (NeilMed) has been recommended as needed and prior to medicated nasal sprays along with instructions for proper administration.  May use over the counter antihistamines such as Zyrtec (cetirizine), Claritin (loratadine), Allegra (fexofenadine), or Xyzal (levocetirizine) daily as needed.  Gastroesophageal reflux disease Currently with suboptimal control.   For now, continue pantoprazole (Protonix) as prescribed.  Continue ranitidine 150 mg twice daily.  Follow up with GI regarding possible EGD.  Continue reflux lifestyle modifications and diet.   Return in about 2 months (around 04/13/2018).

## 2018-02-11 NOTE — Progress Notes (Signed)
Follow Up Note  RE: Alyssa Clay MRN: [id number] DOB: [birth year] Date of Office Visit: 02/11/2018  Referring provider: Caren Macadam, MD Primary care provider: Caren Macadam, MD  Chief Complaint: Cough and Nasal Congestion  History of Present Illness: I had the pleasure of seeing Alyssa Clay for a follow up visit at the [facility] on 02/11/2018. She is a 52 y.o. female, who is being followed for chronic rhinitis, asthma and GERD. Today she is here for new complaint of worsening cough. Her previous allergy office visit was on 01/07/2018 with Dr. Verlin Fester.   Moderate persistent asthma Patient has been having worsening PND and coughing. She can't sleep at night and lay down for the past 2-3 weeks. This happened last year in the fall and was hospitalized last Christmas for a week for asthma exacerbation so patient is very concerned. Sometimes cough is productive and sometimes it's not. Patient has lost 13 lbs the last month due to intentional dieting but would like to add exercise to her regimen which she can't now because the coughing issues.  Currently on Symbicort 160 2 puffs BID, Spiriva 2 puffs daily, Singulair daily with unsure if it's helping or not.  Patient has been using albuterol 4-5 times a week with some benefit at times.  No recent CXR. Last prednisone was about 1 month ago for migraines which helped the migraines and maybe even helped the coughing.  She is on lisinopril and has been on it for man years. She did not have issues with coughing during the summer.   Chronic rhinitis Currently on Xhance 2 sprays twice a day with unsure benefit. Not doing saline rinses. Patient believes she has some PND as well but not sure.   Not using carbinoxamine as it was ineffective.  GERD (gastroesophageal reflux disease) Currently on Protonix and zantac 150mg  twice a day. Patient is scheduled to see GI doctor and possible EGD.  Assessment  and Plan: Alyssa Clay is a 52 y.o. female with: Moderate persistent asthma Interim history - coughing for the past 2-3 weeks. Albuterol sometimes helps. Concerned as last year patient was hospitalized for asthma exacerbation. No recent prednisone use for asthma.   Continue Symbicort (budesonide/formoterol) 160/4.5 g, 2 inhalations twice a day with spacer and rinse mouth afterwards.  Continue Spiriva Respimat 1.25 g, 2 inhalations daily.  For now, continue montelukast 10 mg daily at bedtime and albuterol HFA, 1 to 2 inhalations every 4-6 hours if needed.  Start prednisone taper. Let me know how you feel by the end of the week.   Discussed with patient that her coughing may be multifactorial in nature.   If coughing persistent, then consider switching lisinopril to a different medication.  Chronic rhinitis Past history - All seasonal and perennial aeroallergen skin tests are negative despite a positive histamine control.  Tried Flonase, Astelin, Xyzal, Periactin, and Claritin without adequate symptom relief Interm history - PND and coughing symptoms.  Stop Xhance as ineffective.   Start dymista 1 spray twice a day and demonstrated proper use. Insurance won't cover so sent in [place] 1 spray twice a day to use to help with PND.   Nasal saline lavage (NeilMed) has been recommended as needed and prior to medicated nasal sprays along with instructions for proper administration.  May use over the counter antihistamines such as Zyrtec (cetirizine), Claritin (loratadine), Allegra (fexofenadine), or Xyzal (levocetirizine) daily as needed.  Gastroesophageal reflux disease Currently with suboptimal control.  For now, continue pantoprazole (Protonix) as prescribed.  Continue ranitidine 150 mg twice daily.  Follow up with GI regarding possible EGD.  Continue reflux lifestyle modifications and diet.   Return in about 2 months (around 04/13/2018).  Meds ordered this encounter    Medications  . Azelastine HCl 0.15 % SOLN    Sig: Place 1 spray into both nostrils 2 (two) times daily.    Dispense:  30 mL    Refill:  5  . fluticasone (FLONASE) 50 MCG/ACT nasal spray    Sig: Place 1 spray into both nostrils 2 (two) times daily.    Dispense:  16 g    Refill:  5   Diagnostics: Spirometry:  Tracings reviewed. Her effort: patient was coughing during spirometry. Interpretation: Not interpretable given effort today..  Please see scanned spirometry results for details.  Medication List:  Current Outpatient Medications  Medication Sig Dispense Refill  . albuterol (PROVENTIL) (2.5 MG/3ML) 0.083% nebulizer solution Take 2.5 mg by nebulization every 6 (six) hours as needed for wheezing or shortness of breath.    . Albuterol Sulfate (PROAIR RESPICLICK) 295 (90 Base) MCG/ACT AEPB Inhale 2 puffs into the lungs every 4 (four) hours as needed. 1 each 5  . budesonide-formoterol (SYMBICORT) 160-4.5 MCG/ACT inhaler Inhale 2 puffs into the lungs 2 (two) times daily. 1 Inhaler 5  . calcium carbonate (OSCAL) 1500 (600 Ca) MG TABS tablet Take 600 mg of elemental calcium by mouth 2 (two) times daily with a meal.    . DULoxetine (CYMBALTA) 60 MG capsule Take 60 mg by mouth daily.    Eduard Roux (AIMOVIG) 70 MG/ML SOAJ Inject into the skin.    Marland Kitchen gabapentin (NEURONTIN) 300 MG capsule     . lisinopril (PRINIVIL,ZESTRIL) 20 MG tablet Take 20 mg by mouth daily.  2  . montelukast (SINGULAIR) 10 MG tablet Take 1 tablet (10 mg total) by mouth daily. 30 tablet 4  . Multiple Vitamin (MULTIVITAMIN WITH MINERALS) TABS tablet Take 1 tablet by mouth daily.    . pantoprazole (PROTONIX) 40 MG tablet Take 1 tablet (40 mg total) by mouth daily. 30 tablet 5  . Probiotic Product (PROBIOTIC PO) Take 1 capsule by mouth daily.    . ranitidine (ZANTAC) 150 MG tablet Take 1 tablet (150 mg total) by mouth 2 (two) times daily. 60 tablet 5  . Riboflavin-Magnesium-Feverfew (MIGRELIEF) 200-180-50 MG TABS Take by  mouth.    . rizatriptan (MAXALT) 10 MG tablet Take 10 mg by mouth as needed for migraine. May repeat in 2 hours if needed    . Tiotropium Bromide Monohydrate (SPIRIVA RESPIMAT) 1.25 MCG/ACT AERS Inhale 2 puffs into the lungs daily. 4 g 5  . traZODone (DESYREL) 100 MG tablet Take 100 mg by mouth at bedtime.    . triamcinolone cream (KENALOG) 0.1 % Apply 1 application topically daily as needed.    . valACYclovir (VALTREX) 1000 MG tablet Take 1,000 mg by mouth 2 (two) times daily.    . Azelastine HCl 0.15 % SOLN Place 1 spray into both nostrils 2 (two) times daily. 30 mL 5  . fluticasone (FLONASE) 50 MCG/ACT nasal spray Place 1 spray into both nostrils 2 (two) times daily. 16 g 5  . ketoconazole (NIZORAL) 2 % cream APPLY TO AFFECTED AREA TWICE A DAY UNTIL 1 WK POST CLEAR  2  . tiZANidine (ZANAFLEX) 2 MG tablet TAKE 1-2 TABS (2-4MG ) BY MOUTH EVERY 6-8 HOURS AS NEEDED (USE UP TO 3 TIMES DAILY)  3  No current facility-administered medications for this visit.    Allergies: No Known Allergies I reviewed her past medical history, social history, family history, and environmental history and no significant changes have been reported from previous visit on 01/07/2018.  Review of Systems  Constitutional: Negative for appetite change, chills, fever and unexpected weight change.  HENT: Positive for postnasal drip. Negative for congestion and rhinorrhea.   Eyes: Negative for itching.  Respiratory: Positive for cough. Negative for chest tightness, shortness of breath and wheezing.   Gastrointestinal: Negative for abdominal pain.  Skin: Negative for rash.  Neurological: Positive for headaches.   Objective: BP 96/62 (BP Location: Left Arm, Patient Position: Sitting, Cuff Size: Normal)   Pulse 100   Resp 16   SpO2 93%  There is no height or weight on file to calculate BMI. Physical Exam  Constitutional: She is oriented to person, place, and time. She appears well-developed and well-nourished.  HENT:    Head: Normocephalic and atraumatic.  Right Ear: External ear normal.  Left Ear: External ear normal.  Nose: Nose normal.  Mouth/Throat: Oropharynx is clear and moist.  Eyes: Conjunctivae and EOM are normal.  Neck: Neck supple.  Cardiovascular: Normal rate, regular rhythm and normal heart sounds. Exam reveals no gallop and no friction rub.  No murmur heard. Pulmonary/Chest: Effort normal and breath sounds normal. She has no wheezes. She has no rales.  Lymphadenopathy:    She has no cervical adenopathy.  Neurological: She is alert and oriented to person, place, and time.  Skin: Skin is warm. No rash noted.  Psychiatric: She has a normal mood and affect. Her behavior is normal.  Nursing note and vitals reviewed.  Previous notes and tests were reviewed. The plan was reviewed with the patient/family, and all questions/concerned were addressed.  It was my pleasure to see Alyssa Clay today and participate in her care. Please feel free to contact me with any questions or concerns.  Sincerely,  Rexene Alberts, DO Allergy & Immunology  [facility] office: [phone] [place]

## 2018-02-12 ENCOUNTER — Encounter: Payer: Self-pay | Admitting: Gastroenterology

## 2018-02-13 ENCOUNTER — Ambulatory Visit: Payer: Self-pay | Admitting: *Deleted

## 2018-02-13 ENCOUNTER — Ambulatory Visit (INDEPENDENT_AMBULATORY_CARE_PROVIDER_SITE_OTHER): Payer: 59 | Admitting: Family Medicine

## 2018-02-13 ENCOUNTER — Encounter: Payer: Self-pay | Admitting: Family Medicine

## 2018-02-13 VITALS — BP 122/78 | HR 102 | Temp 98.8°F | Wt 281.0 lb

## 2018-02-13 DIAGNOSIS — J020 Streptococcal pharyngitis: Secondary | ICD-10-CM

## 2018-02-13 MED ORDER — AMOXICILLIN 500 MG PO CAPS: 500.0000 mg | ORAL_CAPSULE | Freq: Two times a day (BID) | ORAL | 0 refills | Status: AC

## 2018-02-13 MED ORDER — FLUCONAZOLE 150 MG PO TABS: ORAL_TABLET | ORAL | 0 refills | Status: DC

## 2018-02-13 NOTE — Progress Notes (Signed)
Subjective:    Patient ID: Alyssa Clay, female    DOB: [birth year], 52 y.o.   MRN: [id number]  No chief complaint on file. Patient is accompanied by her husband.  HPI Patient was seen today for acute concern.  Pt endorses extremely sore throat which started this morning.  Pt has not tried anything for her symptoms.  Pt denies headache, ear pain/pressure, facial pain, rhinorrhea, fever.  Pt endorses a chronic cough for which she sees an allergy and asthma specialist who started her on prednisone Monday.  Possible sick contacts include people at the doctor's office.  Past Medical History:  Diagnosis Date  . Asthma   . Depression   . Hypertension   . Migraine     No Known Allergies  ROS General: Denies fever, chills, night sweats, changes in weight, changes in appetite HEENT: Denies headaches, ear pain, changes in vision, rhinorrhea  +sore throat CV: Denies CP, palpitations, SOB, orthopnea Pulm: Denies SOB, wheezing  + chronic cough GI: Denies abdominal pain, nausea, vomiting, diarrhea, constipation GU: Denies dysuria, hematuria, frequency, vaginal discharge Msk: Denies muscle cramps, joint pains Neuro: Denies weakness, numbness, tingling Skin: Denies rashes, bruising Psych: Denies depression, anxiety, hallucinations     Objective:    Blood pressure 122/78, pulse (!) 102, temperature 98.8 F (37.1 C), temperature source Oral, weight 281 lb (127.5 kg), SpO2 95 %.   Gen. Pleasant, well-nourished, in no distress, normal affect   HEENT: [place]/AT, face symmetric, no scleral icterus, PERRLA, EOMI, nares patent without drainage, Mallampati 3, tonsils 2+ pharynx with erythema and mild exudate, cervical lymphadenopathy/tenderness. Lungs: no accessory muscle use, CTAB, no wheezes or rales Cardiovascular: Tachycardic, no m/r/g, no peripheral edema Neuro:  A&Ox3, CN II-XII intact, normal gait  Wt Readings from Last 3 Encounters:  02/13/18 281 lb (127.5 kg)  02/07/18 282 lb 4.8  oz (128.1 kg)  01/24/18 285 lb 12.8 oz (129.6 kg)    Lab Results  Component Value Date   WBC 8.2 02/07/2018   HGB 14.3 02/07/2018   HCT 43.7 02/07/2018   PLT 354.0 02/07/2018   CHOL 246 (H) 02/07/2018   TRIG 126.0 02/07/2018   HDL 50.40 02/07/2018   LDLCALC 170 (H) 02/07/2018   HGBA1C 6.3 02/07/2018    Assessment/Plan:  Strep pharyngitis  -Rapid strep test negative, however based on exam will preemptively treat for suspected strep pharyngitis -Pt encouraged to gargle with warm salt water or Chloraseptic spray -Pt given Diflucan for likely antibiotic induced yeast infection. -Given handout - Plan: fluconazole (DIFLUCAN) 150 MG tablet, amoxicillin (AMOXIL) 500 MG capsule  Follow-up PRN  Grier Mitts, MD

## 2018-02-13 NOTE — Telephone Encounter (Signed)
Called in c/o a very sore throat that is making it hard to swallow due to the pain.   Denies difficulty with breathing or handling secretions.   She has an 8:00 appt with Dr. Volanda Napoleon for this problem so triage cut short so she could get to appt on time once determined she was not in distress.  See triage notes.  Reason for Disposition . [1] Swallowing difficulty AND [2] cause unknown (Exception: difficulty swallowing is a chronic symptom)    C/o throat very sore and hard to swallow.   Started this morning.  Denies difficulty breathing or difficulty with secretions.  Answer Assessment - Initial Assessment Questions 1. SYMPTOM: "Are you having difficulty swallowing liquids, solids, or both?"     It hurts a lot to swallow.  2. ONSET: "When did the swallowing problems begin?"      Started this morning when woke.   Have white spots on back of throat. 3. CAUSE: "What do you think is causing the problem?"      I don't know 4. CHRONIC/RECURRENT: "Is this a new problem for you?"  If no, ask: "How long have you had this problem?" (e.g., days, weeks, months)      No 5. OTHER SYMPTOMS: "Do you have any other symptoms?" (e.g., difficulty breathing, sore throat, swollen tongue, chest pain)     Not asked because she has an 8:00 appt and she is getting ready to walk out the door to see Dr. Volanda Napoleon. 6. PREGNANCY: "Is there any chance you are pregnant?" "When was your last menstrual period?"     Not asked  Protocols used: SWALLOWING DIFFICULTY-A-AH

## 2018-02-13 NOTE — Patient Instructions (Signed)
Strep Throat Strep throat is a bacterial infection of the throat. Your health care provider may call the infection tonsillitis or pharyngitis, depending on whether there is swelling in the tonsils or at the back of the throat. Strep throat is most common during the cold months of the year in children who are 5-50 years of age, but it can happen during any season in people of any age. This infection is spread from person to person (contagious) through coughing, sneezing, or close contact. What are the causes? Strep throat is caused by the bacteria called Streptococcus pyogenes. What increases the risk? This condition is more likely to develop in:  People who spend time in crowded places where the infection can spread easily.  People who have close contact with someone who has strep throat.  What are the signs or symptoms? Symptoms of this condition include:  Fever or chills.  Redness, swelling, or pain in the tonsils or throat.  Pain or difficulty when swallowing.  White or yellow spots on the tonsils or throat.  Swollen, tender glands in the neck or under the jaw.  Red rash all over the body (rare).  How is this diagnosed? This condition is diagnosed by performing a rapid strep test or by taking a swab of your throat (throat culture test). Results from a rapid strep test are usually ready in a few minutes, but throat culture test results are available after one or two days. How is this treated? This condition is treated with antibiotic medicine. Follow these instructions at home: Medicines  Take over-the-counter and prescription medicines only as told by your health care provider.  Take your antibiotic as told by your health care provider. Do not stop taking the antibiotic even if you start to feel better.  Have family members who also have a sore throat or fever tested for strep throat. They may need antibiotics if they have the strep infection. Eating and drinking  Do not  share food, drinking cups, or personal items that could cause the infection to spread to other people.  If swallowing is difficult, try eating soft foods until your sore throat feels better.  Drink enough fluid to keep your urine clear or pale yellow. General instructions  Gargle with a salt-water mixture 3-4 times per day or as needed. To make a salt-water mixture, completely dissolve -1 tsp of salt in 1 cup of warm water.  Make sure that all household members wash their hands well.  Get plenty of rest.  Stay home from school or work until you have been taking antibiotics for 24 hours.  Keep all follow-up visits as told by your health care provider. This is important. Contact a health care provider if:  The glands in your neck continue to get bigger.  You develop a rash, cough, or earache.  You cough up a thick liquid that is green, yellow-brown, or bloody.  You have pain or discomfort that does not get better with medicine.  Your problems seem to be getting worse rather than better.  You have a fever. Get help right away if:  You have new symptoms, such as vomiting, severe headache, stiff or painful neck, chest pain, or shortness of breath.  You have severe throat pain, drooling, or changes in your voice.  You have swelling of the neck, or the skin on the neck becomes red and tender.  You have signs of dehydration, such as fatigue, dry mouth, and decreased urination.  You become increasingly sleepy, or   you cannot wake up completely.  Your joints become red or painful. This information is not intended to replace advice given to you by your health care provider. Make sure you discuss any questions you have with your health care provider. Document Released: 04/07/2000 Document Revised: 12/08/2015 Document Reviewed: 08/03/2014 Elsevier Interactive Patient Education  2018 [employer]  

## 2018-03-01 ENCOUNTER — Other Ambulatory Visit: Payer: Self-pay | Admitting: Family Medicine

## 2018-03-01 ENCOUNTER — Ambulatory Visit: Payer: Self-pay

## 2018-03-01 DIAGNOSIS — J020 Streptococcal pharyngitis: Secondary | ICD-10-CM

## 2018-03-01 MED ORDER — FLUCONAZOLE 150 MG PO TABS: ORAL_TABLET | ORAL | 0 refills | Status: DC

## 2018-03-01 NOTE — Telephone Encounter (Signed)
Please advise 

## 2018-03-01 NOTE — Telephone Encounter (Signed)
Pt. c/o 3 days of vaginal burning, itching, and discharge.  Reported she had been on Amoxicillin 10 days for strep infection, and then 2 additional days for pre-treatment for dental appt.  Took Diflucan that she was prescribed by Dr. Volanda Napoleon, with the Amoxicillin.  Stated she feels that the additional 2 days of Amoxicillin caused the yeast to flare up again. Last dose of Amoxicillin was on 02/26/18.  Reported she has tried Monistat, and it is not effective.  Is requesting another Rx for Diflucan be called in.  C/o feeling achy and warm. Reported due to being in bed all day for cold symptoms, she did not call the office earlier re: the yeast.          Reason for Disposition . [1] Symptoms of a yeast infection (i.e., itchy, white discharge, not bad smelling) AND    [2] not improved > 3 days following CARE ADVICE    Pt. Decline an appt.  Recent Amoxicillin regime x 12 days with flare up of burning, itching and vaginal discharge.  Requesting another Rx for Diflucan.  Reported Monistat is not effected.  Answer Assessment - Initial Assessment Questions 1. SYMPTOM: "What's the main symptom you're concerned about?" (e.g., pain, itching, dryness)     Burning and itching vagina 2. LOCATION: "Where is the  symptom located?" (e.g., inside/outside, left/right)     vagina 3. ONSET: "When did the  sx's start?"     3 days ago  4. PAIN: "Is there any pain?" If so, ask: "How bad is it?" (Scale: 1-10; mild, moderate, severe)    Denied pain, just uncomfortable  5. ITCHING: "Is there any itching?" If so, ask: "How bad is it?" (Scale: 1-10; mild, moderate, severe)    Severe 6. CAUSE: "What do you think is causing the discharge?" "Have you had the same problem before? What happened then?"    Recent abx.  7. OTHER SYMPTOMS: "Do you have any other symptoms?" (e.g., fever, itching, vaginal bleeding, pain with urination, injury to genital area, vaginal foreign body)      Burning, itching, discharge; has an URI; has had  some fever sx's feeling warm, achy,  with the URI  8. PREGNANCY: "Is there any chance you are pregnant?" "When was your last menstrual period?"     IUD; very irreg. Menstrual cycle  Protocols used: VAGINAL [facility]  Message from Sheran Luz sent at 03/01/2018 2:32 PM EST   Summary: yeast infection-requesting medication    Patient states that she is sure she has a yeast infection as she has had them multiple times before. Patient is experiencing itching and burning in pubic region. Patient is requesting diflucan sent to pharmacy. Advised patient that she would need appointment but patient refused. Please advise.

## 2018-03-01 NOTE — Telephone Encounter (Signed)
[place] but if sx not resolved after weekend or worsening needs to be seen.

## 2018-03-04 NOTE — Telephone Encounter (Signed)
Patient stated she picked up the Rx on Friday and is already feeling better.

## 2018-03-12 ENCOUNTER — Ambulatory Visit: Payer: 59 | Admitting: Gastroenterology

## 2018-03-18 ENCOUNTER — Encounter: Payer: Self-pay | Admitting: Family Medicine

## 2018-03-18 DIAGNOSIS — L281 Prurigo nodularis: Secondary | ICD-10-CM | POA: Insufficient documentation

## 2018-03-27 ENCOUNTER — Encounter: Payer: Self-pay | Admitting: Family Medicine

## 2018-03-27 ENCOUNTER — Ambulatory Visit (INDEPENDENT_AMBULATORY_CARE_PROVIDER_SITE_OTHER): Payer: 59 | Admitting: Family Medicine

## 2018-03-27 VITALS — BP 122/62 | HR 88 | Temp 97.9°F | Wt 278.2 lb

## 2018-03-27 DIAGNOSIS — K219 Gastro-esophageal reflux disease without esophagitis: Secondary | ICD-10-CM

## 2018-03-27 DIAGNOSIS — M25561 Pain in right knee: Secondary | ICD-10-CM

## 2018-03-27 DIAGNOSIS — R7301 Impaired fasting glucose: Secondary | ICD-10-CM

## 2018-03-27 DIAGNOSIS — Z6841 Body Mass Index (BMI) 40.0 and over, adult: Secondary | ICD-10-CM

## 2018-03-27 DIAGNOSIS — E785 Hyperlipidemia, unspecified: Secondary | ICD-10-CM | POA: Diagnosis not present

## 2018-03-27 DIAGNOSIS — J4540 Moderate persistent asthma, uncomplicated: Secondary | ICD-10-CM

## 2018-03-27 DIAGNOSIS — I10 Essential (primary) hypertension: Secondary | ICD-10-CM

## 2018-03-27 MED ORDER — LOSARTAN POTASSIUM 50 MG PO TABS: 50.0000 mg | ORAL_TABLET | Freq: Every day | ORAL | 2 refills | Status: DC

## 2018-03-27 MED ORDER — PANTOPRAZOLE SODIUM 40 MG PO TBEC: 40.0000 mg | DELAYED_RELEASE_TABLET | Freq: Two times a day (BID) | ORAL | 5 refills | Status: DC

## 2018-03-27 NOTE — Patient Instructions (Addendum)
Shoot for 1200-1500 calories daily  Can add pepcid to your heartburn control regimen. Start with dose before bedtime. Then increase to twice daily if still noting symptoms.

## 2018-03-27 NOTE — Progress Notes (Signed)
Alyssa Clay DOB: November 09, 1965 Encounter date: 03/27/2018  This is a 52 y.o. female who presents with No chief complaint on file.   History of present illness:  Broke tooth - made eating healthier harder. Has been working on trying to eat healthy.   Just got new puppy so that has been a lot of activity. Walking malls, walking around block.     Has switched grains to whole wheat. Having self eat more fruit - trying to do more variety. Chicken on salad. Walnuts on salad. Eating more eggs. Increased protein at dinner/lunch.   Did meet with nutritionist in September. Didn't think it was very helpful. She is a picky eater so just has a harder time getting variety. Uses spinach for salad; green beens, peas. Drinking mostly water during day. Coffee with minimal creamer in morning. Uses light balsamic vinegarette for salads. Portion control is hit or miss.   Joint pain in hands has completely resolved.   Has noted pain in right knee over last couple of months. Not sure if related to stepping over puppy gates and changing ways of walking in house. Has had surgery left knee; wondering if arthritis is kicking in for right leg. Also more steps up and down deck taking dog out. Does bother her sometimes with walking. Not swelling. No obvious time of injury. Not hurting right now.    No Known Allergies No outpatient medications have been marked as taking for the 03/27/18 encounter (Office Visit) with Caren Macadam, MD.    Review of Systems  Constitutional: Negative for chills, fatigue and fever.  Respiratory: Negative for cough, chest tightness, shortness of breath and wheezing.   Cardiovascular: Negative for chest pain, palpitations and leg swelling.    Objective:  BP 122/62   Pulse 88   Temp 97.9 F (36.6 C)   Wt 278 lb 3.2 oz (126.2 kg)   SpO2 95%   BMI 44.90 kg/m   Weight: 278 lb 3.2 oz (126.2 kg)   BP Readings from Last 3 Encounters:  03/27/18 122/62  02/13/18  122/78  02/11/18 96/62   Wt Readings from Last 3 Encounters:  03/27/18 278 lb 3.2 oz (126.2 kg)  02/13/18 281 lb (127.5 kg)  02/07/18 282 lb 4.8 oz (128.1 kg)    Physical Exam  Constitutional: She is oriented to person, place, and time. She appears well-developed and well-nourished. No distress.  Cardiovascular: Normal rate, regular rhythm and normal heart sounds. Exam reveals no friction rub.  No murmur heard. No lower extremity edema  Pulmonary/Chest: Effort normal and breath sounds normal. No respiratory distress. She has no wheezes. She has no rales.  Neurological: She is alert and oriented to person, place, and time.  Psychiatric: Her behavior is normal. Cognition and memory are normal.    Assessment/Plan  1. Acute pain of right knee We discussed working on strengthening/support for knee. Let me know if worsening or interested in seeking specialty eval.   2. Hyperlipidemia, unspecified hyperlipidemia type  - Lipid panel; Future  3. Class 3 severe obesity due to excess calories without serious comorbidity with body mass index (BMI) of 40.0 to 44.9 in adult [facility]) Working hard to change diet and improve regular exercise. Keep up good work. Follow up as needed for continued weight loss.  4. Elevated fasting glucose  - Hemoglobin A1c; Future  5. Gastroesophageal reflux disease, esophagitis presence not specified Discussed triggers; also discussed that GERD will improve with weight loss. Ok to add pepcid to current medication.  6. Moderate persistent asthma, unspecified whether complicated Stable. Continue with symbicort. Monitor for any worsening of symptoms.   7. Hypertension, unspecified type Stable. We are switching to losartan in case lisinopril is contributing to cough. She will monitor.      Return Labwork in April; follow up appointment pending results.     Micheline Rough, MD

## 2018-04-08 ENCOUNTER — Ambulatory Visit: Payer: 59 | Admitting: Allergy and Immunology

## 2018-04-15 ENCOUNTER — Ambulatory Visit: Payer: Self-pay

## 2018-04-15 NOTE — Telephone Encounter (Signed)
See if she is able to get in with any other provider in practice today?   If symptoms not worsening I would also be ok with her having appointment tomorrow (if sx worsen then ED). She has had some unusual neurological sx in past so I trust her to monitor and report/act on any worsening sx.

## 2018-04-15 NOTE — Telephone Encounter (Signed)
Appointment made with Dr Martinique 10:15 am 04/16/18.

## 2018-04-15 NOTE — Telephone Encounter (Signed)
Patient is calling to reports that she has had some improvement in the numbness in her hand over the last couple hours- she was just calling to let PCP know-she is still waiting to hear back.

## 2018-04-15 NOTE — Telephone Encounter (Signed)
Pt reports sudden onset numbness and tingling of left hand, "Mild" numbness around mouth. Onset 2 hours ago. Denies facial droop, states  "Smile normal." Reports numbness is "Half way up to elbow, but not as bad as my hand." Denies any dysphagia, speech clear and non-haltering during call. Pt also reports pain left pinky finger x 1-2 weeks, "80% of the time pain is there". Denies any neck pain.  States has been congested past few days. BP checked during call 123/66. Denies any visual changes, dizziness. Pt has H/O migraines; reports mild headache "From being sick."  Pt refuses disposition of UC/ED. [place] called practice and spoke to Rachael who will alert Dr. Ethlyn Gallery, advise on disposition.  Please advise: [phone]  Reason for Disposition . [1] Tingling (e.g., pins and needles) of the face, arm / hand, or leg / foot on one side of the body AND [2] present now    "With numbness"  Answer Assessment - Initial Assessment Questions 1. SYMPTOM: "What is the main symptom you are concerned about?" (e.g., weakness, numbness)    Numbness left hand and mouth 2. ONSET: "When did this start?" (minutes, hours, days; while sleeping)     2 hours ago 3. LAST NORMAL: "When was the last time you were normal (no symptoms)?"     2 hours ago 4. PATTERN "Does this come and go, or has it been constant since it started?"  "Is it present now?"     constant 5. CARDIAC SYMPTOMS: "Have you had any of the following symptoms: chest pain, difficulty breathing, palpitations?"     LGT, congestion 6. NEUROLOGIC SYMPTOMS: "Have you had any of the following symptoms: headache, dizziness, vision loss, double vision, changes in speech, unsteady on your feet?"    Headache "From being sick" 7. OTHER SYMPTOMS: "Do you have any other symptoms?"     Congestion 8. PREGNANCY: "Is there any chance you are pregnant?" "When was your last menstrual period?"     no  Protocols used: NEUROLOGIC DEFICIT-A-AH

## 2018-04-16 ENCOUNTER — Encounter: Payer: Self-pay | Admitting: Family Medicine

## 2018-04-16 ENCOUNTER — Ambulatory Visit (INDEPENDENT_AMBULATORY_CARE_PROVIDER_SITE_OTHER): Payer: 59 | Admitting: Family Medicine

## 2018-04-16 VITALS — BP 126/84 | HR 86 | Temp 98.1°F | Resp 12 | Ht 66.0 in | Wt 292.2 lb

## 2018-04-16 DIAGNOSIS — J4540 Moderate persistent asthma, uncomplicated: Secondary | ICD-10-CM

## 2018-04-16 DIAGNOSIS — J069 Acute upper respiratory infection, unspecified: Secondary | ICD-10-CM

## 2018-04-16 DIAGNOSIS — R200 Anesthesia of skin: Secondary | ICD-10-CM | POA: Diagnosis not present

## 2018-04-16 DIAGNOSIS — M79645 Pain in left finger(s): Secondary | ICD-10-CM

## 2018-04-16 LAB — POCT INFLUENZA A/B
INFLUENZA B, POC: NEGATIVE
Influenza A, POC: NEGATIVE

## 2018-04-16 MED ORDER — WRIST SPLINT/COCK-UP/LEFT L MISC: 1.0000 | Freq: Every day | 0 refills | Status: DC

## 2018-04-16 MED ORDER — BENZONATATE 100 MG PO CAPS: 200.0000 mg | ORAL_CAPSULE | Freq: Two times a day (BID) | ORAL | 0 refills | Status: AC | PRN

## 2018-04-16 MED ORDER — PREDNISONE 20 MG PO TABS: 40.0000 mg | ORAL_TABLET | Freq: Every day | ORAL | 0 refills | Status: AC

## 2018-04-16 NOTE — Progress Notes (Signed)
ACUTE VISIT   HPI:  Chief Complaint  Patient presents with  . Tingling    tingling in left hand and face on yesterday, severe pain in left pinky finger for 2 weeks  . Cough    with phlem at night  . Fever    temp 99.6 to 100.2 at night only    Ms.Ariely Ileah Falkenstein is a 52 y.o. female, who is here today with female friend with several complaints.  Finger pain: 2 weeks of severe pain, 10/10, in fifth left finger.  Pain is constant, she is not sure about alleviating or exacerbating factors. Pain has been stable since onset. Pain is not exacerbated by movement. She has not noted skin color changes, limitation of range of motion, or joint edema/erythema.  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 She has not tried OTC medications.  Cold symptoms. 3 days of fever, 99.5-100.2 F, body aches, and non-productive cough. Stated that sometimes she feels like she has some mucus in her throat but she is not able to bring it up. Fatigue. Shortness of breath and wheezing at night when she is in bed. She denies dyspnea with exertion during the day. She has history of asthma, last time she used albuterol inhaler was yesterday morning.  Negative for travel or insect bite. Her son was sick with a cold recently. She took Motrin last night.  Hand numbness: New onset of numbness of left hand yesterday between 6 and 10 AM. She has some improvement upon checking hand. She is not sure about exacerbating factors. She denies neck pain or focal weakness. Problem has improved, "90% gone."  Denies history of carpal tunnel syndrome.   Review of Systems  Constitutional: Positive for activity change, appetite change, chills, fatigue and fever.  HENT: Positive for congestion, postnasal drip and rhinorrhea. Negative for ear pain, mouth sores, sinus pressure, sore throat, trouble swallowing and voice change.   Eyes: Negative for discharge and redness.    Respiratory: Positive for cough, shortness of breath and wheezing.   Cardiovascular: Negative for chest pain and leg swelling.  Gastrointestinal: Negative for abdominal pain, diarrhea, nausea and vomiting.  Musculoskeletal: Positive for arthralgias and myalgias. Negative for gait problem and neck pain.  Skin: Negative for color change and rash.  Allergic/Immunologic: Positive for environmental allergies.  Neurological: Positive for numbness. Negative for syncope, weakness and headaches.  Hematological: Negative for adenopathy. Does not bruise/bleed easily.  Psychiatric/Behavioral: Positive for sleep disturbance. Negative for confusion. The patient is nervous/anxious.       Current Outpatient Medications on File Prior to Visit  Medication Sig Dispense Refill  . albuterol (PROVENTIL) (2.5 MG/3ML) 0.083% nebulizer solution Take 2.5 mg by nebulization every 6 (six) hours as needed for wheezing or shortness of breath.    . Albuterol Sulfate (PROAIR RESPICLICK) 258 (90 Base) MCG/ACT AEPB Inhale 2 puffs into the lungs every 4 (four) hours as needed. 1 each 5  . Azelastine HCl 0.15 % SOLN Place 1 spray into both nostrils 2 (two) times daily. 30 mL 5  . budesonide-formoterol (SYMBICORT) 160-4.5 MCG/ACT inhaler Inhale 2 puffs into the lungs 2 (two) times daily. 1 Inhaler 5  . calcium carbonate (OSCAL) 1500 (600 Ca) MG TABS tablet Take 600 mg of elemental calcium by mouth 2 (two) times daily with a meal.    . DULoxetine (CYMBALTA) 60 MG capsule Take 60 mg by mouth daily.    Eduard Roux (  AIMOVIG) 70 MG/ML SOAJ Inject into the skin.    . fluconazole (DIFLUCAN) 150 MG tablet Take 1 pill now.  Take second pill in 3 days if symptoms continue. 2 tablet 0  . fluticasone (FLONASE) 50 MCG/ACT nasal spray Place 1 spray into both nostrils 2 (two) times daily. 16 g 5  . gabapentin (NEURONTIN) 300 MG capsule     . ketoconazole (NIZORAL) 2 % cream APPLY TO AFFECTED AREA TWICE A DAY UNTIL 1 WK POST CLEAR  2  .  losartan (COZAAR) 50 MG tablet Take 1 tablet (50 mg total) by mouth daily. 30 tablet 2  . montelukast (SINGULAIR) 10 MG tablet Take 1 tablet (10 mg total) by mouth daily. 30 tablet 4  . Multiple Vitamin (MULTIVITAMIN WITH MINERALS) TABS tablet Take 1 tablet by mouth daily.    . pantoprazole (PROTONIX) 40 MG tablet Take 1 tablet (40 mg total) by mouth 2 (two) times daily before a meal. 30 tablet 5  . Probiotic Product (PROBIOTIC PO) Take 1 capsule by mouth daily.    . Riboflavin-Magnesium-Feverfew (MIGRELIEF) 200-180-50 MG TABS Take by mouth.    . rizatriptan (MAXALT) 10 MG tablet Take 10 mg by mouth as needed for migraine. May repeat in 2 hours if needed    . Radiation protection practitioner (GUARDIAN SHARPS COLLECTOR) MISC Use with injection.    . Tiotropium Bromide Monohydrate (SPIRIVA RESPIMAT) 1.25 MCG/ACT AERS Inhale 2 puffs into the lungs daily. 4 g 5  . tiZANidine (ZANAFLEX) 2 MG tablet TAKE 1-2 TABS (2-4MG ) BY MOUTH EVERY 6-8 HOURS AS NEEDED (USE UP TO 3 TIMES DAILY)  3  . traZODone (DESYREL) 100 MG tablet Take 100 mg by mouth at bedtime.    . triamcinolone cream (KENALOG) 0.1 % Apply 1 application topically daily as needed.    . valACYclovir (VALTREX) 1000 MG tablet Take 1,000 mg by mouth 2 (two) times daily.     No current facility-administered medications on file prior to visit.      Past Medical History:  Diagnosis Date  . Asthma   . Depression   . Hypertension   . Migraine    No Known Allergies  Social History   Socioeconomic History  . Marital status: Married    Spouse name: Not on file  . Number of children: Not on file  . Years of education: Not on file  . Highest education level: Not on file  Occupational History  . Not on file  Social Needs  . Financial resource strain: Not on file  . Food insecurity:    Worry: Not on file    Inability: Not on file  . Transportation needs:    Medical: Not on file    Non-medical: Not on file  Tobacco Use  . Smoking status: Never Smoker    . Smokeless tobacco: Never Used  Substance and Sexual Activity  . Alcohol use: Yes    Frequency: Never    Comment: occ  . Drug use: Never  . Sexual activity: Not on file  Lifestyle  . Physical activity:    Days per week: Not on file    Minutes per session: Not on file  . Stress: Not on file  Relationships  . Social connections:    Talks on phone: Not on file    Gets together: Not on file    Attends religious service: Not on file    Active member of club or organization: Not on file    Attends meetings of clubs or  organizations: Not on file    Relationship status: Not on file  Other Topics Concern  . Not on file  Social History Narrative  . Not on file    Vitals:   04/16/18 1010  BP: 126/84  Pulse: 86  Resp: 12  Temp: 98.1 F (36.7 C)  SpO2: 97%   Body mass index is 47.17 kg/m.   Physical Exam  Nursing note and vitals reviewed. Constitutional: She is oriented to person, place, and time. She appears well-developed. She does not appear ill. No distress.  HENT:  Head: Normocephalic and atraumatic.  Right Ear: Tympanic membrane, external ear and ear canal normal.  Left Ear: Tympanic membrane, external ear and ear canal normal.  Nose: Rhinorrhea present. Right sinus exhibits no maxillary sinus tenderness and no frontal sinus tenderness. Left sinus exhibits no maxillary sinus tenderness and no frontal sinus tenderness.  Mouth/Throat: Oropharynx is clear and moist and mucous membranes are normal.  Nasal voice. Postnasal drainage.  Eyes: Conjunctivae are normal.  Cardiovascular: Normal rate and regular rhythm.  No murmur heard. Respiratory: Effort normal and breath sounds normal. No stridor. No respiratory distress.  Prolonged expiration.  Musculoskeletal:     Left hand: She exhibits normal range of motion, no tenderness, no bony tenderness, normal capillary refill and no deformity.     Comments: Left Phalen positive and tinel negative. No significant deformities or  signs of synovitis. 5th left finger no tenderness upon palpation, IP joints normal ROM, pain is not elicited. Wrist with normal ROM.  Lymphadenopathy:       Head (right side): No submandibular adenopathy present.       Head (left side): No submandibular adenopathy present.    She has no cervical adenopathy.  Neurological: She is alert and oriented to person, place, and time. She has normal strength. No cranial nerve deficit. Gait normal.  Skin: Skin is warm. No rash noted. No cyanosis or erythema.  Psychiatric: Her mood appears anxious.  Well groomed, good eye contact.    ASSESSMENT AND PLAN:  Ms. Lamerle was seen today for tingling, cough and fever.  Diagnoses and all orders for this visit:  Moderate persistent asthma, unspecified whether complicated Auscultation today negative for rales, wheezing, or rhonchi. In the past she has been hospitalized for asthma exacerbation, so she would like to take prednisone. We discussed some side effects, recommend prednisone 40 mg daily for 3 to 5 days. Albuterol inh 2 puff every 6 hours for a week then as needed for wheezing or shortness of breath.  No changes in Singulair. Instructed about warning signs. -     predniSONE (DELTASONE) 20 MG tablet; Take 2 tablets (40 mg total) by mouth daily with breakfast for 5 days.  Pain in finger of left hand ?  Arthritis, radicular. Examination of fifth left finger negative. Recommend continue monitoring and to follow-up with PCP if persistent.  Numbness of left hand We discussed possible etiologies. Neurologic evaluation today was negative. Phalen test was positive, so I could be related with chronic carpal syndrome. Recommend wrist splint at night for a few weeks. Follow-up with PCP if problem is persistent in 4 to 6 weeks, before if problem gets worse.  -     Elastic Bandages & Supports (WRIST SPLINT/COCK-UP/LEFT L) MISC; 1 Device by Does not apply route daily.  URI, acute Most likely viral  etiology. Symptomatic treatment recommended for now. Rapid flu testing here in the office was negative. Explained that cough and congestion can last a few  days and even weeks.  -     benzonatate (TESSALON) 100 MG capsule; Take 2 capsules (200 mg total) by mouth 2 (two) times daily as needed for up to 10 days. -     POC Influenza A/B    Return in about 2 weeks (around 04/30/2018) for PCP.     Marjarie Irion G. Martinique, MD  [facility]. [place] office.

## 2018-04-16 NOTE — Patient Instructions (Addendum)
  [name] I have seen you today for an acute visit.  A few things to remember from today's visit:   Pain in finger of left hand  Numbness of left hand - Plan: Elastic Bandages & Supports (WRIST SPLINT/COCK-UP/LEFT L) MISC  URI, acute - Plan: benzonatate (TESSALON) 100 MG capsule, POC Influenza A/B  Moderate persistent asthma, unspecified whether complicated - Plan: predniSONE (DELTASONE) 20 MG tablet   If medications prescribed today, they will not be refill upon request, a follow up appointment with PCP will be necessary to discuss continuation of of treatment if appropriate.  viral infections are self-limited and we treat each symptom depending of severity.  Over the counter medications as decongestants and cold medications usually help, they need to be taken with caution if there is a history of high blood pressure or palpitations. Tylenol and/or Ibuprofen also helps with most symptoms (headache, muscle aching, fever,etc) Plenty of fluids. Honey helps with cough. Steam inhalations helps with runny nose, nasal congestion, and may prevent sinus infections. Cough and nasal congestion could last a few days and sometimes weeks. Please follow in not any better in 1-2 weeks or if symptoms get worse.  Albuterol inh 2 puff every 6 hours for a week then as needed for wheezing or shortness of breath.  Take prednisone in the morning with breakfast.  Left hand numbness could be related to carpal tunnel syndrome.  Wear the splint at night for a few weeks.  Finger pain?  Arthritis.  In general please monitor for signs of worsening symptoms and seek immediate medical attention if any concerning.    I hope you get better soon!

## 2018-04-22 ENCOUNTER — Encounter: Payer: Self-pay | Admitting: Allergy

## 2018-04-22 ENCOUNTER — Ambulatory Visit (INDEPENDENT_AMBULATORY_CARE_PROVIDER_SITE_OTHER): Payer: 59 | Admitting: Allergy

## 2018-04-22 VITALS — BP 120/78 | HR 90 | Temp 98.8°F | Resp 18

## 2018-04-22 DIAGNOSIS — K219 Gastro-esophageal reflux disease without esophagitis: Secondary | ICD-10-CM

## 2018-04-22 DIAGNOSIS — J4550 Severe persistent asthma, uncomplicated: Secondary | ICD-10-CM | POA: Diagnosis not present

## 2018-04-22 DIAGNOSIS — J310 Chronic rhinitis: Secondary | ICD-10-CM | POA: Diagnosis not present

## 2018-04-22 NOTE — Patient Instructions (Addendum)
Moderate persistent asthma  Get bloodwork in 3-4 weeks and depending on results we may start you on asthma biologic injections. . Daily controller medication(s): symbicort 160 2 puffs twice a day with spacer and rinse mouth afterwards + spiriva 2 puffs daily + singulair 10mg  daily. . Prior to physical activity: May use albuterol rescue inhaler 2 puffs 5 to 15 minutes prior to strenuous physical activities. Marland Kitchen Rescue medications: May use albuterol rescue inhaler 2 puffs or nebulizer every 4 to 6 hours as needed for shortness of breath, chest tightness, coughing, and wheezing. Monitor frequency of use.  . During upper respiratory infections: Start asmanex 200 1 puff twice a day with spacer and rinse mouth afterwards for 1 week.  . Asthma control goals:  o Full participation in all desired activities (may need albuterol before activity) o Albuterol use two times or less a week on average (not counting use with activity) o Cough interfering with sleep two times or less a month o Oral steroids no more than once a year o No hospitalizations  Chronic rhinitis  May use Flonase + Astelin 1 spray twice a day as needed.  Nasal saline spray (i.e., Simply Saline) or nasal saline lavage (i.e., NeilMed) is recommended as needed and prior to medicated nasal sprays.  Gastroesophageal reflux disease  For now, continue pantoprazole (Protonix) as prescribed.  Follow up with GI regarding possible EGD.  Continue reflux lifestyle modifications and diet.   Follow up in 3 months

## 2018-04-22 NOTE — Assessment & Plan Note (Signed)
Interim history - Had another course of prednisone during recent URI. Thinks the Spiriva is helping.  Get bloodwork (CBC diff and IgE level) in 3-4 weeks and depending on results we may start on biologics to get better control of the asthma.  Today's spirometry showed: Patient's exhalation loop was not ideal, data not interpretable . Daily controller medication(s): Symbicort 160 2 puffs twice a day with spacer and rinse mouth afterwards + Spiriva 2 puffs daily + singulair 10mg  daily. . Prior to physical activity: May use albuterol rescue inhaler 2 puffs 5 to 15 minutes prior to strenuous physical activities. Marland Kitchen Rescue medications: May use albuterol rescue inhaler 2 puffs or nebulizer every 4 to 6 hours as needed for shortness of breath, chest tightness, coughing, and wheezing. Monitor frequency of use.  . During upper respiratory infections: Start asmanex 200 1 puff twice a day with spacer and rinse mouth afterwards for 1 week. Sample given.

## 2018-04-22 NOTE — Assessment & Plan Note (Signed)
Past history - All seasonal and perennial aeroallergen skin tests are negative despite a positive histamine control.  Tried Flonase, Xhance, Astelin, Xyzal, Periactin, and Claritin without adequate symptom relief.  Interm history - symptoms controlled with Flonase.   May use Flonase + Astelin 1 spray twice a day as needed.  Nasal saline spray (i.e., Simply Saline) or nasal saline lavage (i.e., NeilMed) is recommended as needed and prior to medicated nasal sprays.

## 2018-04-22 NOTE — Progress Notes (Signed)
Follow Up Note  RE: Alyssa Clay MRN: [id number] DOB: 18-Jun-1965 Date of Office Visit: 04/22/2018  Referring provider: Caren Macadam, MD Primary care provider: Caren Macadam, MD  Chief Complaint: Cough (Worse at night); Allergic Rhinitis ; and Asthma  History of Present Illness: I had the pleasure of seeing Alyssa Clay for a follow up visit at the [facility] on 04/22/2018. She is a 52 y.o. female, who is being followed for asthma, chronic rhinitis, GERD. Today she is here for regular follow up visit. Her previous allergy office visit was on 02/11/2018 with Dr. Maudie Mercury.   Moderate persistent asthma  Patient was sick about 7-10 days ago with fevers at night and went to see her PCP. She was started on oral prednisone (last dose is today) and tessalon Perles. Currently on Symbicort 160 2 puffs BID and Spiriva 2 puffs daily, singulair daily with some benefit but noticed some flares with the weather changes. Using rescue inhaler about 3-4 times per week still.   Chronic rhinitis  Only using Flonase and Astelin as needed with some benefit. Flonase seems to work better than Astelin.   Gastroesophageal reflux disease  Currently on pantoprazole (Protonix) with good benefit.  Stopped zantac and did not notice any worsening symptoms. No GI follow up yet.  Assessment and Plan: Alyssa Clay is a 52 y.o. female with: Not well controlled severe persistent asthma Interim history - Had another course of prednisone during recent URI. Thinks the Spiriva is helping.  Get bloodwork (CBC diff and IgE level) in 3-4 weeks and depending on results we may start on biologics to get better control of the asthma.  Today's spirometry showed: Patient's exhalation loop was not ideal, data not interpretable . Daily controller medication(s): Symbicort 160 2 puffs twice a day with spacer and rinse mouth afterwards + Spiriva 2 puffs daily + singulair 10mg  daily. . Prior to  physical activity: May use albuterol rescue inhaler 2 puffs 5 to 15 minutes prior to strenuous physical activities. Marland Kitchen Rescue medications: May use albuterol rescue inhaler 2 puffs or nebulizer every 4 to 6 hours as needed for shortness of breath, chest tightness, coughing, and wheezing. Monitor frequency of use.  . During upper respiratory infections: Start asmanex 200 1 puff twice a day with spacer and rinse mouth afterwards for 1 week. Sample given.   Chronic rhinitis Past history - All seasonal and perennial aeroallergen skin tests are negative despite a positive histamine control.  Tried Flonase, Xhance, Astelin, Xyzal, Periactin, and Claritin without adequate symptom relief.  Interm history - symptoms controlled with Flonase.   May use Flonase + Astelin 1 spray twice a day as needed.  Nasal saline spray (i.e., Simply Saline) or nasal saline lavage (i.e., NeilMed) is recommended as needed and prior to medicated nasal sprays.  Gastroesophageal reflux disease Controlled with Protonix. Stopped zantac and no worsening symptoms.   Continue pantoprazole (Protonix) as prescribed.  Follow up with GI regarding possible EGD.  Continue reflux lifestyle modifications and diet.   Return in about 3 months (around 07/22/2018).  Lab Orders     IgE     CBC With Differential  Diagnostics: Spirometry:  Tracings reviewed. Her effort: It was hard to get consistent efforts and there is a question as to whether this reflects a maximal maneuver. Interpretation: Patient's exhalation loop was not ideal, data not interpretable.  Please see scanned spirometry results for details.  Medication List:  Current Outpatient Medications  Medication Sig Dispense  Refill  . albuterol (PROVENTIL) (2.5 MG/3ML) 0.083% nebulizer solution Take 2.5 mg by nebulization every 6 (six) hours as needed for wheezing or shortness of breath.    . Albuterol Sulfate (PROAIR RESPICLICK) 536 (90 Base) MCG/ACT AEPB Inhale 2 puffs into  the lungs every 4 (four) hours as needed. 1 each 5  . budesonide-formoterol (SYMBICORT) 160-4.5 MCG/ACT inhaler Inhale 2 puffs into the lungs 2 (two) times daily. 1 Inhaler 5  . calcium carbonate (OSCAL) 1500 (600 Ca) MG TABS tablet Take 600 mg of elemental calcium by mouth 2 (two) times daily with a meal.    . DULoxetine (CYMBALTA) 60 MG capsule Take 60 mg by mouth daily.    Eduard Roux (AIMOVIG) 70 MG/ML SOAJ Inject into the skin.    Marland Kitchen gabapentin (NEURONTIN) 300 MG capsule     . losartan (COZAAR) 50 MG tablet Take 1 tablet (50 mg total) by mouth daily. 30 tablet 2  . montelukast (SINGULAIR) 10 MG tablet Take 1 tablet (10 mg total) by mouth daily. 30 tablet 4  . Multiple Vitamin (MULTIVITAMIN WITH MINERALS) TABS tablet Take 1 tablet by mouth daily.    . pantoprazole (PROTONIX) 40 MG tablet Take 1 tablet (40 mg total) by mouth 2 (two) times daily before a meal. 30 tablet 5  . Probiotic Product (PROBIOTIC PO) Take 1 capsule by mouth daily.    . Riboflavin-Magnesium-Feverfew (MIGRELIEF) 200-180-50 MG TABS Take by mouth.    . Tiotropium Bromide Monohydrate (SPIRIVA RESPIMAT) 1.25 MCG/ACT AERS Inhale 2 puffs into the lungs daily. 4 g 5  . tiZANidine (ZANAFLEX) 2 MG tablet TAKE 1-2 TABS (2-4MG ) BY MOUTH EVERY 6-8 HOURS AS NEEDED (USE UP TO 3 TIMES DAILY)  3  . traZODone (DESYREL) 100 MG tablet Take 100 mg by mouth at bedtime.    . triamcinolone cream (KENALOG) 0.1 % Apply 1 application topically daily as needed.    . valACYclovir (VALTREX) 1000 MG tablet Take 1,000 mg by mouth 2 (two) times daily.    . Azelastine HCl 0.15 % SOLN Place 1 spray into both nostrils 2 (two) times daily. (Patient not taking: Reported on 04/22/2018) 30 mL 5  . benzonatate (TESSALON) 100 MG capsule Take 2 capsules (200 mg total) by mouth 2 (two) times daily as needed for up to 10 days. (Patient not taking: Reported on 04/22/2018) 40 capsule 0  . Elastic Bandages & Supports (WRIST SPLINT/COCK-UP/LEFT L) MISC 1 Device by  Does not apply route daily. 1 each 0  . fluconazole (DIFLUCAN) 150 MG tablet Take 1 pill now.  Take second pill in 3 days if symptoms continue. (Patient not taking: Reported on 04/22/2018) 2 tablet 0  . fluticasone (FLONASE) 50 MCG/ACT nasal spray Place 1 spray into both nostrils 2 (two) times daily. (Patient not taking: Reported on 04/22/2018) 16 g 5  . ketoconazole (NIZORAL) 2 % cream APPLY TO AFFECTED AREA TWICE A DAY UNTIL 1 WK POST CLEAR  2  . rizatriptan (MAXALT) 10 MG tablet Take 10 mg by mouth as needed for migraine. May repeat in 2 hours if needed    . Radiation protection practitioner (GUARDIAN SHARPS COLLECTOR) MISC Use with injection.     No current facility-administered medications for this visit.    Allergies: No Known Allergies I reviewed her past medical history, social history, family history, and environmental history and no significant changes have been reported from previous visit on 02/11/2018.  Review of Systems  Constitutional: Negative for appetite change, chills, fever and unexpected weight change.  HENT: Positive for postnasal drip. Negative for congestion and rhinorrhea.   Eyes: Negative for itching.  Respiratory: Positive for cough. Negative for chest tightness, shortness of breath and wheezing.   Gastrointestinal: Negative for abdominal pain.  Skin: Negative for rash.  Allergic/Immunologic: Negative for environmental allergies.  Neurological: Positive for headaches.   Objective: BP 120/78 (BP Location: Left Arm, Patient Position: Sitting, Cuff Size: Normal)   Pulse 90   Temp 98.8 F (37.1 C) (Oral)   Resp 18   SpO2 95%  There is no height or weight on file to calculate BMI. Physical Exam  Constitutional: She is oriented to person, place, and time. She appears well-developed and well-nourished.  HENT:  Head: Normocephalic and atraumatic.  Right Ear: External ear normal.  Left Ear: External ear normal.  Nose: Nose normal.  Mouth/Throat: Oropharynx is clear and moist.    Eyes: Conjunctivae and EOM are normal.  Neck: Neck supple.  Cardiovascular: Normal rate, regular rhythm and normal heart sounds. Exam reveals no gallop and no friction rub.  No murmur heard. Pulmonary/Chest: Effort normal and breath sounds normal. She has no wheezes. She has no rales.  Neurological: She is alert and oriented to person, place, and time.  Skin: Skin is warm. No rash noted.  Psychiatric: She has a normal mood and affect. Her behavior is normal.  Nursing note and vitals reviewed.  Previous notes and tests were reviewed. The plan was reviewed with the patient/family, and all questions/concerned were addressed.  It was my pleasure to see Murphy today and participate in her care. Please feel free to contact me with any questions or concerns.  Sincerely,  Rexene Alberts, DO Allergy & Immunology  [facility] office: [phone] [facility] office: [phone]

## 2018-04-22 NOTE — Assessment & Plan Note (Signed)
Controlled with Protonix. Stopped zantac and no worsening symptoms.   Continue pantoprazole (Protonix) as prescribed.  Follow up with GI regarding possible EGD.  Continue reflux lifestyle modifications and diet.

## 2018-04-30 ENCOUNTER — Telehealth: Payer: Self-pay | Admitting: Family Medicine

## 2018-04-30 MED ORDER — LOSARTAN POTASSIUM 50 MG PO TABS: 50.0000 mg | ORAL_TABLET | Freq: Every day | ORAL | 2 refills | Status: DC

## 2018-04-30 NOTE — Telephone Encounter (Signed)
Rx sent in. Patient is aware.  

## 2018-04-30 NOTE — Addendum Note (Signed)
Addended by: Rebecca Eaton on: 04/30/2018 10:57 AM   Modules accepted: Orders

## 2018-04-30 NOTE — Telephone Encounter (Signed)
Copied from [place] [id number] [id number]. Topic: Quick Communication - Rx Refill/Question >> Apr 30, 2018  9:20 AM Virl Axe D wrote: Medication: losartan (COZAAR) 50 MG tablet / Pt stated that she let the pharmacy know she was no longer taking lisinopril and they instead cancelled her rx for Losartan. She needs a brand new rx sent to the pharmacy in order to get it refilled. Please advise.   Has the patient contacted their pharmacy? Yes (Agent: If no, request that the patient contact the pharmacy for the refill.) (Agent: If yes, when and what did the pharmacy advise?)  Preferred Pharmacy (with phone number or street name): [facility], [place] [phone] (Phone) [phone] (Fax)   Agent: Please be advised that RX refills may take up to 3 business days. We ask that you follow-up with your pharmacy.

## 2018-05-01 ENCOUNTER — Ambulatory Visit: Payer: 59 | Admitting: Family Medicine

## 2018-05-17 ENCOUNTER — Encounter: Payer: Self-pay | Admitting: Family Medicine

## 2018-05-17 ENCOUNTER — Ambulatory Visit (INDEPENDENT_AMBULATORY_CARE_PROVIDER_SITE_OTHER): Payer: 59 | Admitting: Family Medicine

## 2018-05-17 ENCOUNTER — Ambulatory Visit (INDEPENDENT_AMBULATORY_CARE_PROVIDER_SITE_OTHER): Payer: 59

## 2018-05-17 VITALS — BP 120/62 | HR 97 | Temp 98.2°F | Wt 286.0 lb

## 2018-05-17 DIAGNOSIS — R509 Fever, unspecified: Secondary | ICD-10-CM | POA: Diagnosis not present

## 2018-05-17 DIAGNOSIS — M79645 Pain in left finger(s): Secondary | ICD-10-CM

## 2018-05-17 DIAGNOSIS — M2550 Pain in unspecified joint: Secondary | ICD-10-CM | POA: Diagnosis not present

## 2018-05-17 IMAGING — DX DG HAND COMPLETE 3+V*L*
3 series · 3 of 3 positions shown · non-contrast
Comparison: None.

CLINICAL DATA: Left fifth digit pain

EXAM:
LEFT HAND - COMPLETE 3+ VIEW

[hand pa]
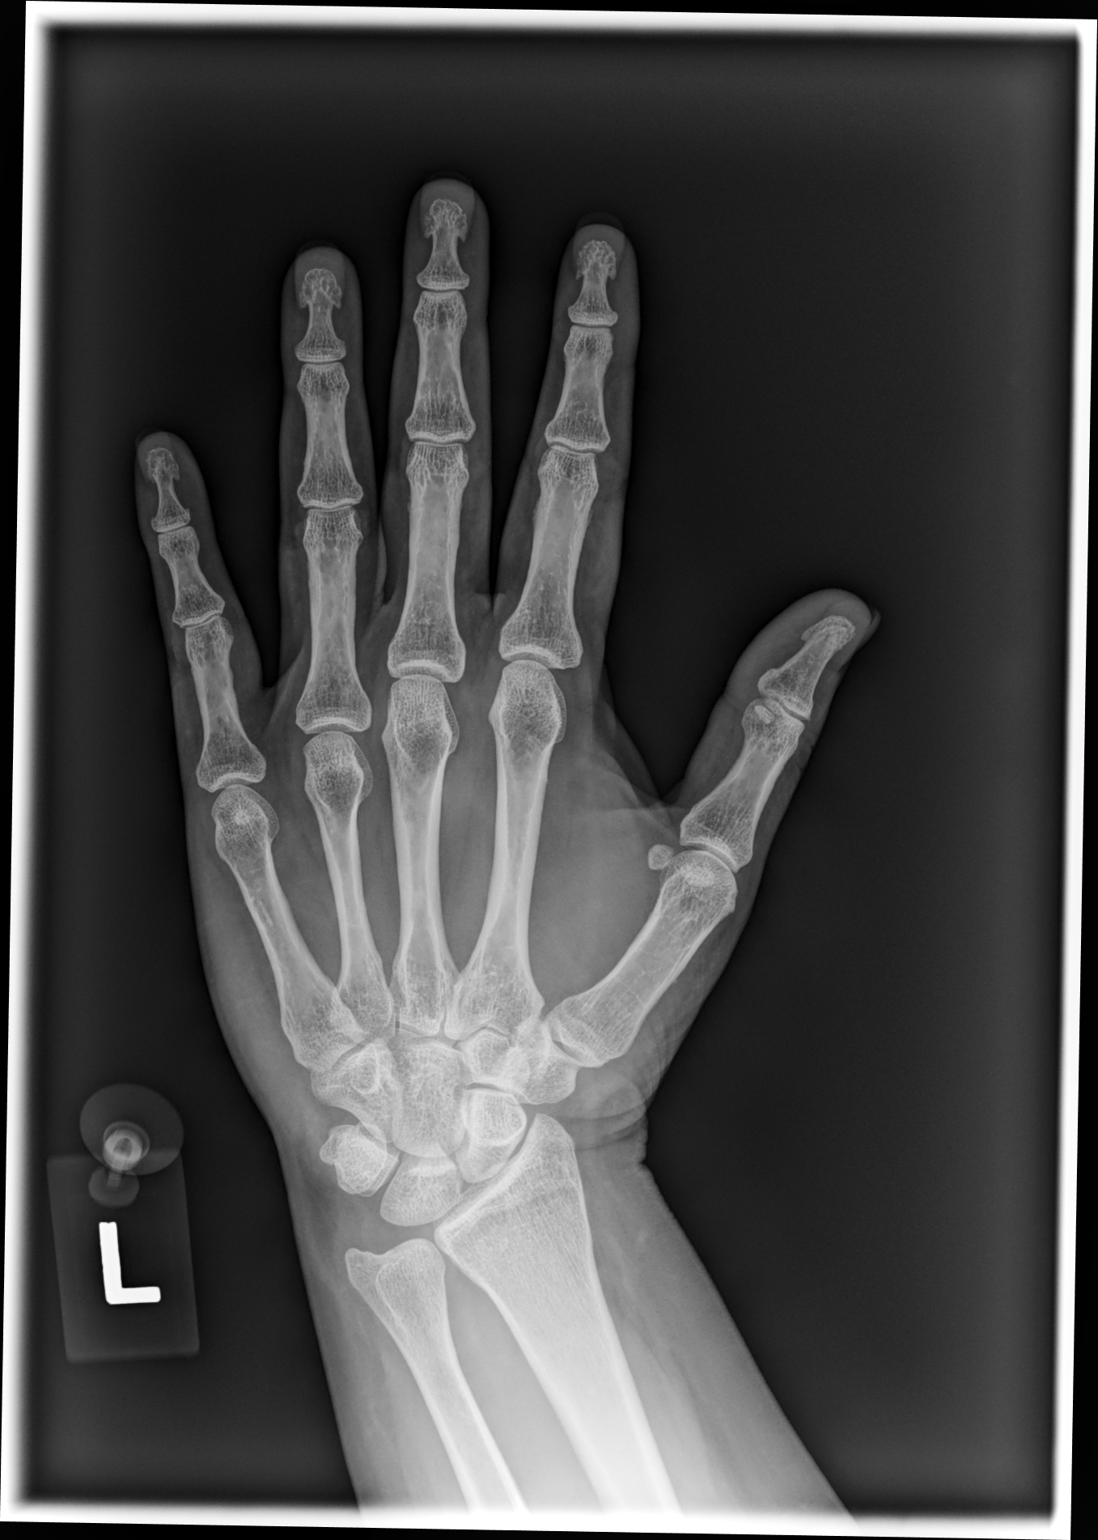

[hand mlo]
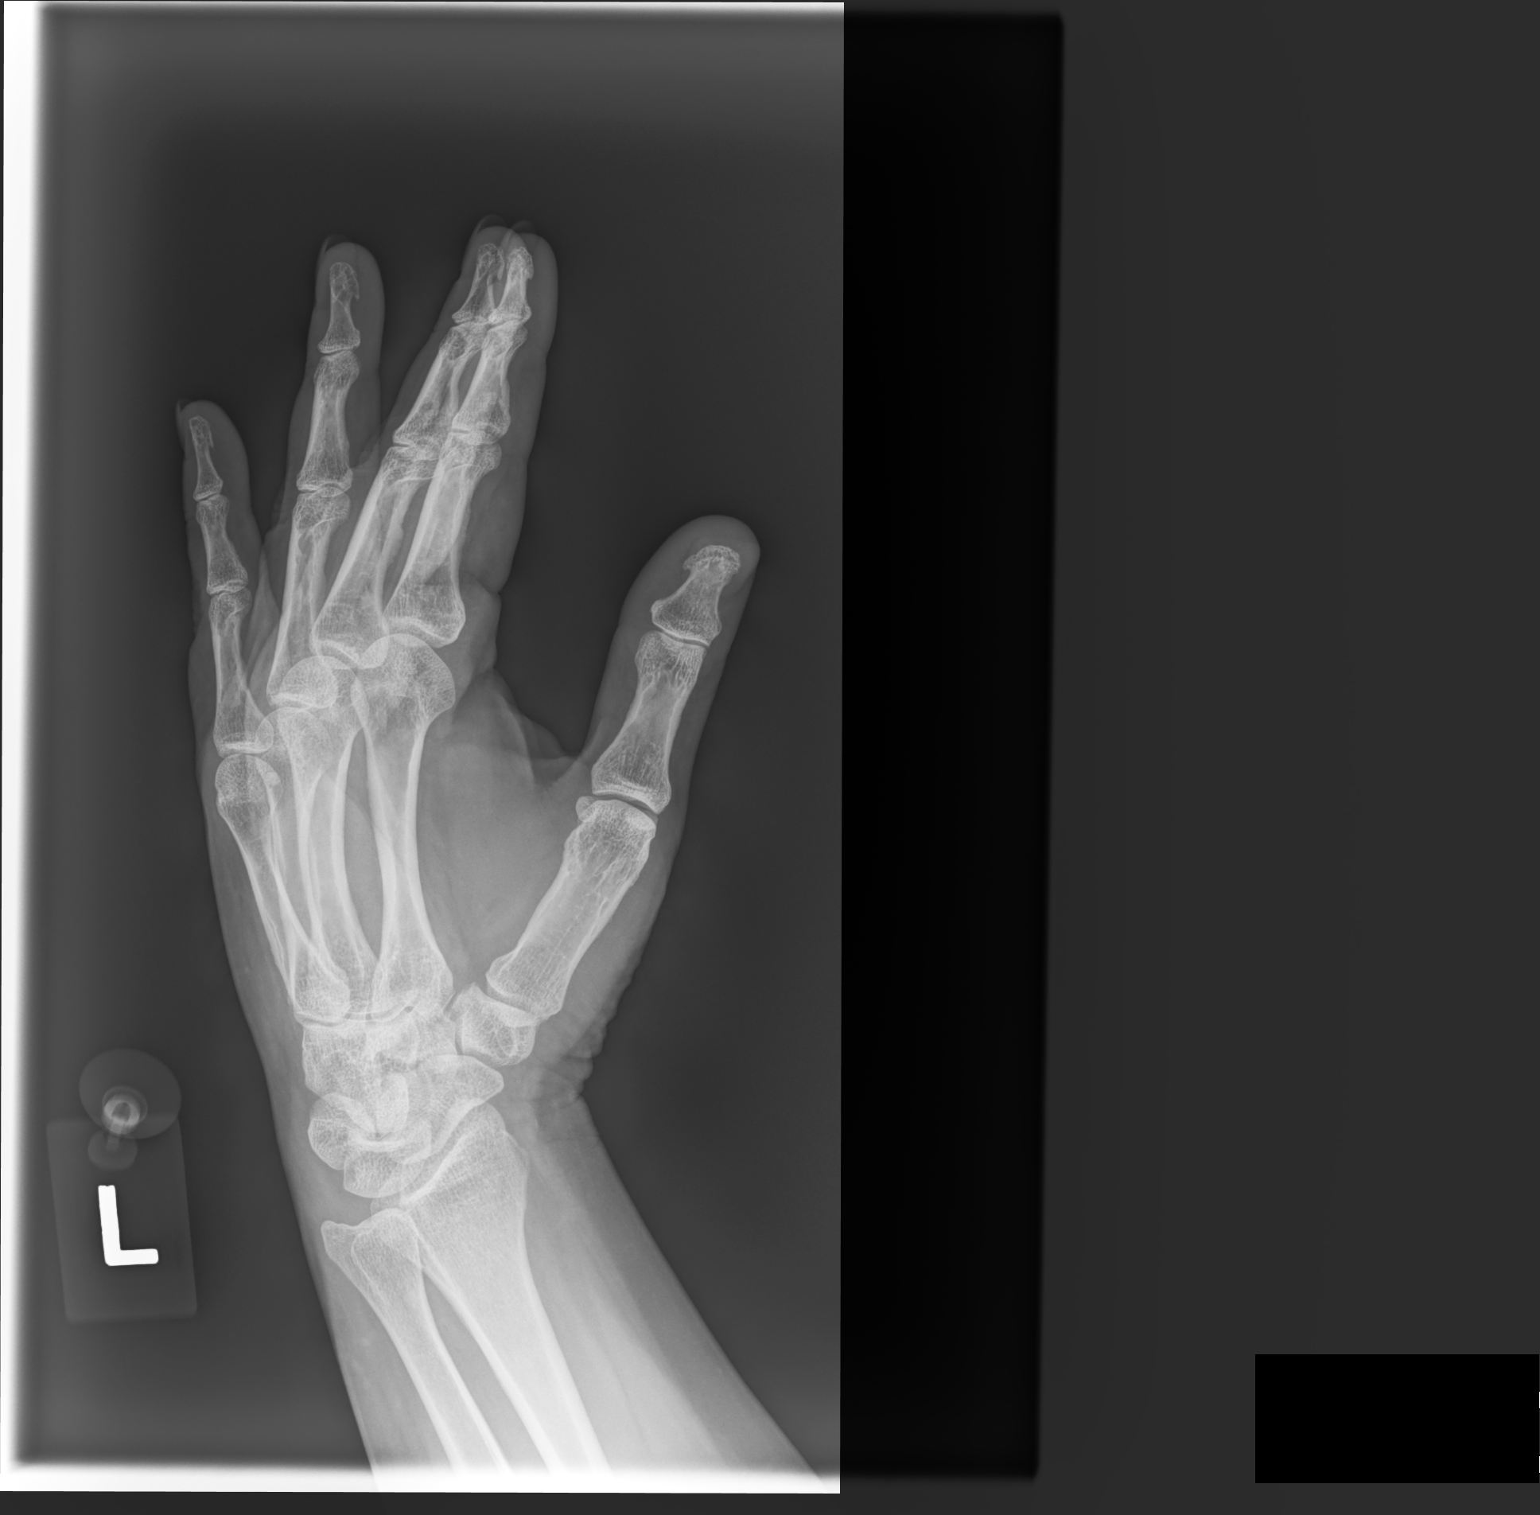

[hand lat]
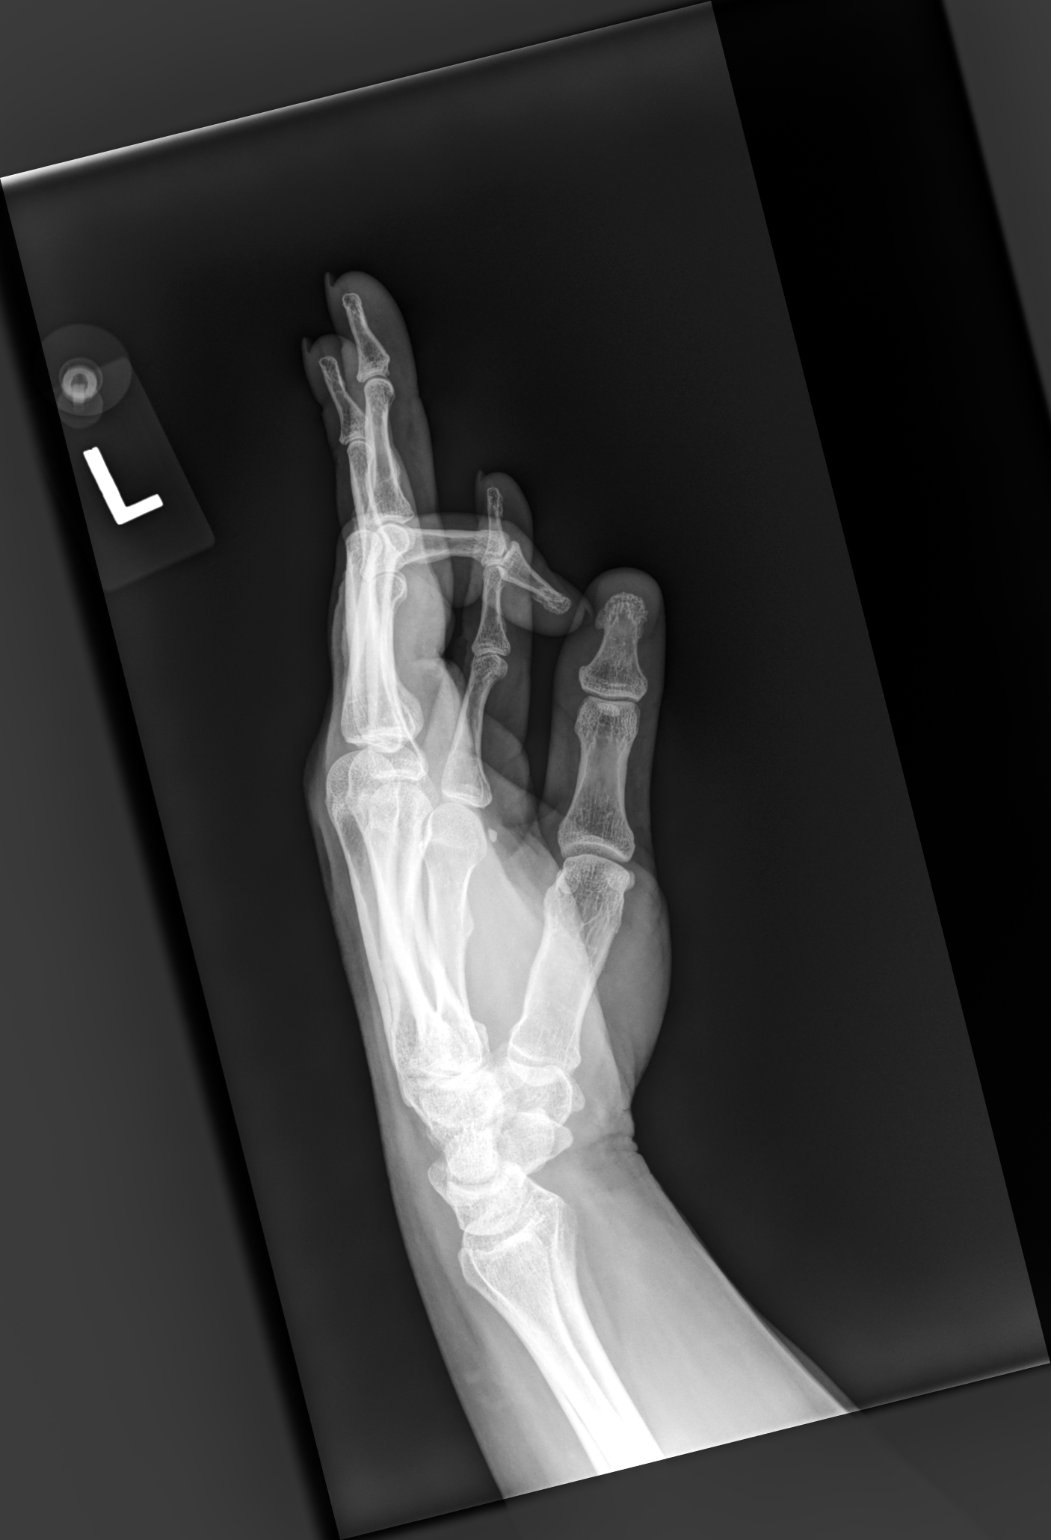

[3 of 3 positions shown; findings below may reference images not displayed]

FINDINGS: There is no evidence of fracture or dislocation. There is no
evidence of arthropathy or other focal bone abnormality. Soft
tissues are unremarkable.
IMPRESSION: No acute or significant osseous finding

## 2018-05-17 IMAGING — DX DG CHEST 2V
2 series · 2 of 2 positions shown · non-contrast
Comparison: None.

CLINICAL DATA: Fever of unknown origin.

EXAM:
CHEST - 2 VIEW

[chest pa]
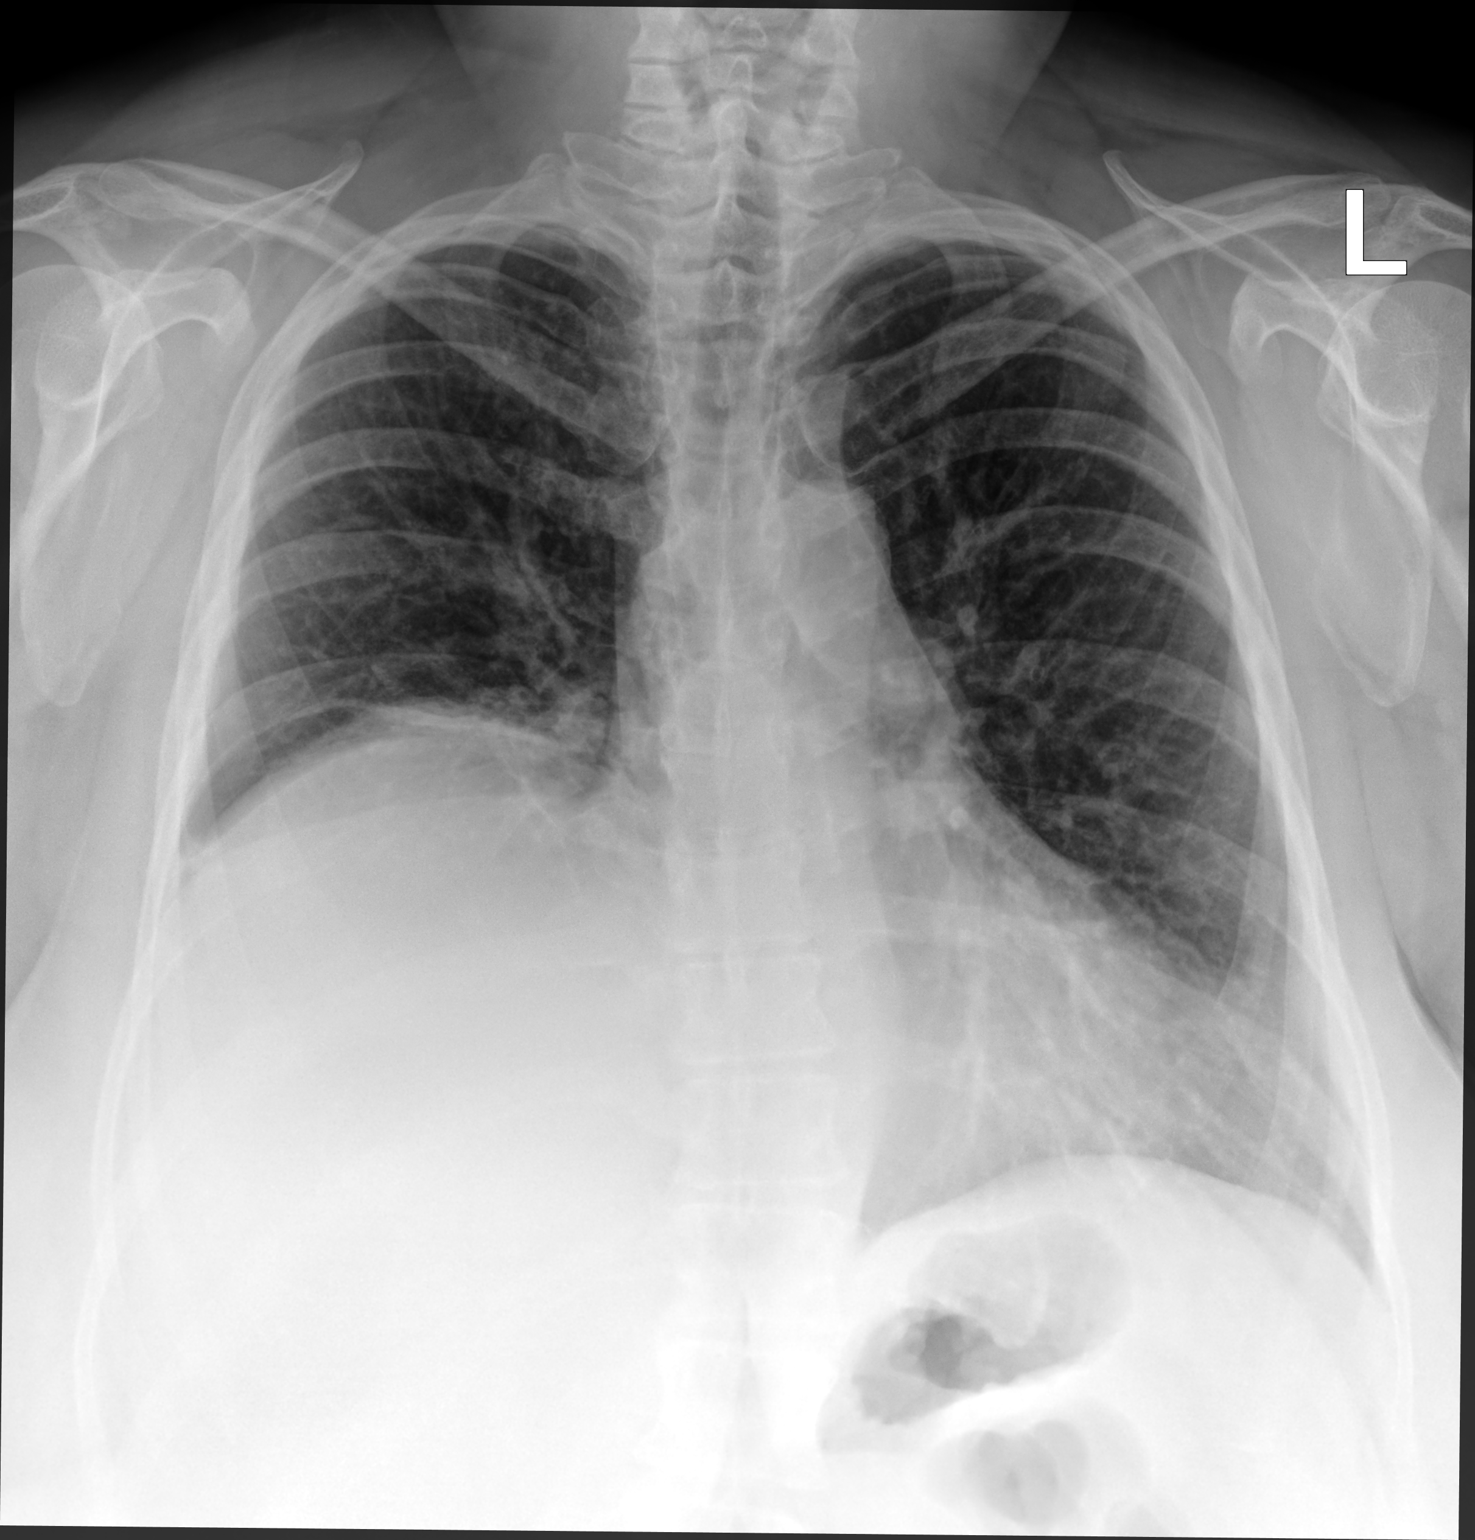

[chest lat]
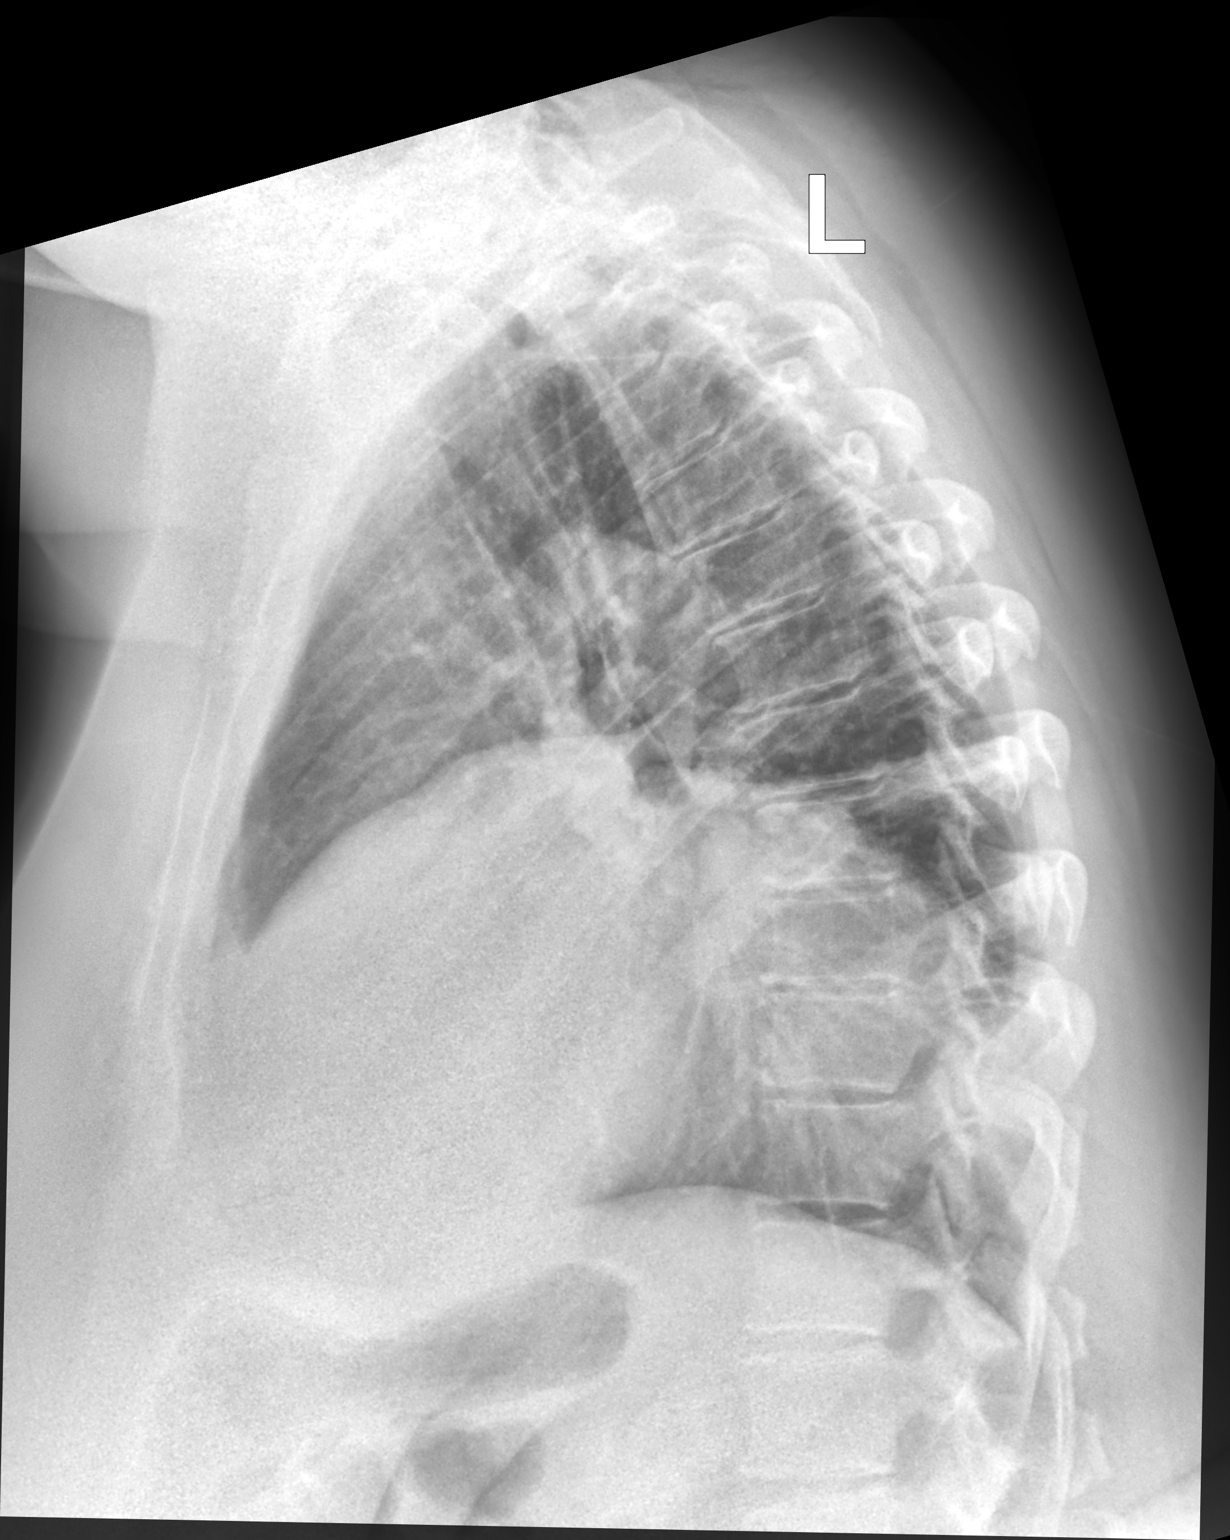

[2 of 2 positions shown; findings below may reference images not displayed]

FINDINGS: Normal sized heart. Significantly elevated right hemidiaphragm with
linear density at the right lung base. Clear left lung. Mild
thoracic spine degenerative changes.
IMPRESSION: Significantly elevated right hemidiaphragm with right basilar
atelectasis/scarring.

## 2018-05-17 MED ORDER — DULOXETINE HCL 60 MG PO CPEP: 60.0000 mg | ORAL_CAPSULE | Freq: Every day | ORAL | 1 refills | Status: DC

## 2018-05-17 MED ORDER — VALACYCLOVIR HCL 1 G PO TABS: 1000.0000 mg | ORAL_TABLET | Freq: Two times a day (BID) | ORAL | 5 refills | Status: DC

## 2018-05-17 MED ORDER — TRAZODONE HCL 100 MG PO TABS: 100.0000 mg | ORAL_TABLET | Freq: Every evening | ORAL | 1 refills | Status: DC | PRN

## 2018-05-17 NOTE — Progress Notes (Signed)
[name] DOB: [birth year] Encounter date: 05/17/2018  This is a 53 y.o. female who presents with Chief Complaint  Patient presents with  . Follow-up      History of present illness: Here for follow up for left pinky tingling, achiness. Still getting fevers in the evening, which has been going on for a couple of months. Usually between 99 and 100.9 and gets really achy. No sweats, just feels cold. Happens about 50% of the time. Using oral thermometer.   Has had some pain in hands over last year, but had been more constant in both hands back in October when she was seen here. Has had some of the fevers on and off as well. Tylenol not helping. Motrin seems to help more.   When she gets fever, aching follows. Knees achy, hands achy. Achy feeling is bad enough that she cannot do her regular activities and needs to stay in recliner. Joints not notably flaring with these spells. Thinks there is some element of muscular pain in addition to joint pain. Pinky left hand is not tingling, it is aching. She feels there was some misunderstanding of this previously (did have episode once of left arm tingling in past but not since).   No known family history of arthritis, autoimmune disease that she is aware of, but family is quiet about health issues.    Does have morning stiffness. Lasts for about 2 hours. Is significant. Has been going on for a year.   No Known Allergies Current Meds  Medication Sig  . albuterol (PROVENTIL) (2.5 MG/3ML) 0.083% nebulizer solution Take 2.5 mg by nebulization every 6 (six) hours as needed for wheezing or shortness of breath.  . Albuterol Sulfate (PROAIR RESPICLICK) 170 (90 Base) MCG/ACT AEPB Inhale 2 puffs into the lungs every 4 (four) hours as needed.  . Azelastine HCl 0.15 % SOLN Place 1 spray into both nostrils 2 (two) times daily.  . budesonide-formoterol (SYMBICORT) 160-4.5 MCG/ACT inhaler Inhale 2 puffs into the lungs 2 (two) times daily.  . calcium  carbonate (OSCAL) 1500 (600 Ca) MG TABS tablet Take 600 mg of elemental calcium by mouth 2 (two) times daily with a meal.  . DULoxetine (CYMBALTA) 60 MG capsule Take 1 capsule (60 mg total) by mouth daily.  . Elastic Bandages & Supports (WRIST SPLINT/COCK-UP/LEFT L) MISC 1 Device by Does not apply route daily.  Eduard Roux (AIMOVIG) 70 MG/ML SOAJ Inject into the skin.  . fluconazole (DIFLUCAN) 150 MG tablet Take 1 pill now.  Take second pill in 3 days if symptoms continue.  . fluticasone (FLONASE) 50 MCG/ACT nasal spray Place 1 spray into both nostrils 2 (two) times daily.  Marland Kitchen gabapentin (NEURONTIN) 300 MG capsule   . ketoconazole (NIZORAL) 2 % cream APPLY TO AFFECTED AREA TWICE A DAY UNTIL 1 WK POST CLEAR  . losartan (COZAAR) 50 MG tablet Take 1 tablet (50 mg total) by mouth daily.  . montelukast (SINGULAIR) 10 MG tablet Take 1 tablet (10 mg total) by mouth daily.  . Multiple Vitamin (MULTIVITAMIN WITH MINERALS) TABS tablet Take 1 tablet by mouth daily.  . pantoprazole (PROTONIX) 40 MG tablet Take 1 tablet (40 mg total) by mouth 2 (two) times daily before a meal.  . Probiotic Product (PROBIOTIC PO) Take 1 capsule by mouth daily.  . Riboflavin-Magnesium-Feverfew (MIGRELIEF) 200-180-50 MG TABS Take by mouth.  . rizatriptan (MAXALT) 10 MG tablet Take 10 mg by mouth as needed for migraine. May repeat in 2 hours if needed  .  Radiation protection practitioner (GUARDIAN SHARPS COLLECTOR) MISC Use with injection.  . Tiotropium Bromide Monohydrate (SPIRIVA RESPIMAT) 1.25 MCG/ACT AERS Inhale 2 puffs into the lungs daily.  Marland Kitchen tiZANidine (ZANAFLEX) 2 MG tablet TAKE 1-2 TABS (2-4MG ) BY MOUTH EVERY 6-8 HOURS AS NEEDED (USE UP TO 3 TIMES DAILY)  . traZODone (DESYREL) 100 MG tablet Take 1 tablet (100 mg total) by mouth at bedtime as needed for sleep.  Marland Kitchen triamcinolone cream (KENALOG) 0.1 % Apply 1 application topically daily as needed.  . valACYclovir (VALTREX) 1000 MG tablet Take 1 tablet (1,000 mg total) by mouth 2 (two)  times daily.  . [DISCONTINUED] DULoxetine (CYMBALTA) 60 MG capsule Take 60 mg by mouth daily.  . [DISCONTINUED] traZODone (DESYREL) 100 MG tablet Take 100 mg by mouth at bedtime.  . [DISCONTINUED] valACYclovir (VALTREX) 1000 MG tablet Take 1,000 mg by mouth 2 (two) times daily.    Review of Systems  Constitutional: Positive for chills, fatigue and fever.  Respiratory: Positive for cough (dry intermittent). Negative for chest tightness, shortness of breath and wheezing.   Cardiovascular: Negative for chest pain, palpitations and leg swelling.  Psychiatric/Behavioral: Negative for sleep disturbance.       Mood is doing great. Loves being here. Has been on the cymblata 60mg  for a couple of years.     Objective:  BP 120/62 (BP Location: Left Arm, Patient Position: Sitting, Cuff Size: Large)   Pulse 97   Temp 98.2 F (36.8 C) (Oral)   Wt 286 lb (129.7 kg)   SpO2 93%   BMI 46.16 kg/m   Weight: 286 lb (129.7 kg)   BP Readings from Last 3 Encounters:  05/17/18 120/62  04/22/18 120/78  04/16/18 126/84   Wt Readings from Last 3 Encounters:  05/17/18 286 lb (129.7 kg)  04/16/18 292 lb 4 oz (132.6 kg)  03/27/18 278 lb 3.2 oz (126.2 kg)    Physical Exam Constitutional:      General: She is not in acute distress.    Appearance: She is well-developed.  Cardiovascular:     Rate and Rhythm: Normal rate and regular rhythm.     Heart sounds: Normal heart sounds. No murmur. No friction rub.  Pulmonary:     Effort: Pulmonary effort is normal. No respiratory distress.     Breath sounds: Normal breath sounds. No wheezing or rales.  Musculoskeletal:     Right lower leg: No edema.     Left lower leg: No edema.     Comments: Subtle tenderness entire left 5th digit. No noted joint effusion/edema of hands, fingers. ROM is within normal.   Neurological:     Mental Status: She is alert and oriented to person, place, and time.  Psychiatric:        Behavior: Behavior normal.      Assessment/Plan  1. Finger pain, left Evaluate with xray; further eval pending this result. - DG Hand Complete Left; Future  2. Fever, unspecified fever cause Unexplained. Has had this for some time and had full bloodwork eval that was normal back in October including autoimmune.  - DG Chest 2 View; Future  3. Arthralgia, unspecified joint Referring to rheumatology for further evaluation/work up. Uncertain if joint pain is purely related to fever, but due to severity of achiness and morning stiffness I feel worthwhile for further eval. - Ambulatory referral to Rheumatology     Return pending xray.     Micheline Rough, MD

## 2018-05-23 ENCOUNTER — Encounter: Payer: Self-pay | Admitting: Family Medicine

## 2018-05-24 NOTE — Telephone Encounter (Signed)
Patient sent 2 sperate MyChart messages. Please see other messages for further documentation.

## 2018-06-03 ENCOUNTER — Other Ambulatory Visit: Payer: Self-pay | Admitting: *Deleted

## 2018-06-03 MED ORDER — ALBUTEROL SULFATE HFA 108 (90 BASE) MCG/ACT IN AERS: 2.0000 | INHALATION_SPRAY | Freq: Four times a day (QID) | RESPIRATORY_TRACT | 1 refills | Status: DC | PRN

## 2018-06-18 ENCOUNTER — Encounter: Payer: Self-pay | Admitting: Family Medicine

## 2018-06-21 ENCOUNTER — Encounter: Payer: Self-pay | Admitting: Family Medicine

## 2018-06-21 NOTE — Progress Notes (Signed)
Office Visit Note  Patient: Alyssa Clay             Date of Birth: 22-Jun-1965           MRN: [id number]             PCP: Caren Macadam, MD Referring: Caren Macadam, MD Visit Date: 07/04/2018 Occupation: @GUAROCC @  Subjective:  Pain in multiple joints.   History of Present Illness: Alyssa Clay is a 53 y.o. female seen in consultation per request of her PCP for evaluation of her symptoms.  According to patient her symptoms started about 2 months ago with left fifth finger pain.  She states that the time she was advised to use a carpal tunnel brace which was not very effective.  She has intermittent pain in her left finger now.  She states last week she started having pain in her right hand and swelling which she describes over the dorsum of her hand.  She states she has occasional pain in her forearms.  Has had chronic pain in her left knee joint from meniscal tear injury 10 years ago.  For which she had surgery.  None of the other joints are painful.  Activities of Daily Living:  Patient reports morning stiffness for 1-2 hours.   Patient Reports nocturnal pain.  Difficulty dressing/grooming: Denies Difficulty climbing stairs: Reports Difficulty getting out of chair: Denies Difficulty using hands for taps, buttons, cutlery, and/or writing: Denies  Review of Systems  Constitutional: Positive for fatigue. Negative for night sweats, weight gain and weight loss.  HENT: Negative for mouth sores, trouble swallowing, trouble swallowing, mouth dryness and nose dryness.   Eyes: Positive for dryness. Negative for pain, redness and visual disturbance.  Respiratory: Positive for shortness of breath. Negative for cough and difficulty breathing.        H/o asthma  Cardiovascular: Negative for chest pain, palpitations, hypertension, irregular heartbeat and swelling in legs/feet.  Gastrointestinal: Negative for blood in stool, constipation and diarrhea.  Endocrine:  Negative for increased urination.  Genitourinary: Negative for vaginal dryness.  Musculoskeletal: Positive for arthralgias, joint pain and morning stiffness. Negative for joint swelling, myalgias, muscle weakness, muscle tenderness and myalgias.  Skin: Negative for color change, rash, hair loss, skin tightness, ulcers and sensitivity to sunlight.  Allergic/Immunologic: Negative for susceptible to infections.  Neurological: Negative for dizziness, memory loss, night sweats and weakness.  Hematological: Negative for swollen glands.  Psychiatric/Behavioral: Positive for depressed mood. Negative for sleep disturbance. The patient is not nervous/anxious.     PMFS History:  Patient Active Problem List   Diagnosis Date Noted  . Not well controlled severe persistent asthma 04/22/2018  . Hypertension 03/27/2018  . 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 Moderate persistent asthma 01/07/2018  . Chronic rhinitis 01/07/2018  . Gastroesophageal reflux disease 01/07/2018    Past Medical History:  Diagnosis Date  . Asthma   . Depression   . Hypertension   . Migraine     Family History  Problem Relation Age of Onset  . Cancer Mother 44       lymphoma  . Depression Mother   . Hypertension Father   . Heart attack Father 44  . Hypertension Brother   . High blood pressure Brother   . High Cholesterol Brother   . Healthy Daughter    Past Surgical History:  Procedure Laterality Date  . CESAREAN SECTION  1994  . DILATION AND CURETTAGE OF UTERUS  multiple due to miscarriages  . KNEE ARTHROSCOPY Left 2013  . TENNIS ELBOW RELEASE/NIRSCHEL PROCEDURE  2009  . WISDOM TOOTH EXTRACTION     Social History   Social History Narrative  . Not on file   Immunization History  Administered Date(s) Administered  . Pneumococcal Conjugate-13 01/24/2018     Objective: Vital Signs: BP 127/84 (BP Location: Right Arm, Patient Position: Sitting, Cuff Size: Large)   Pulse  87   Resp 15   Ht 5' 5.5" (1.664 m)   Wt 285 lb 6.4 oz (129.5 kg)   BMI 46.77 kg/m    Physical Exam Vitals signs and nursing note reviewed.  Constitutional:      Appearance: She is well-developed.  HENT:     Head: Normocephalic and atraumatic.  Eyes:     Conjunctiva/sclera: Conjunctivae normal.  Neck:     Musculoskeletal: Normal range of motion.  Cardiovascular:     Rate and Rhythm: Normal rate and regular rhythm.     Heart sounds: Normal heart sounds.  Pulmonary:     Effort: Pulmonary effort is normal.     Breath sounds: Normal breath sounds.  Abdominal:     General: Bowel sounds are normal.     Palpations: Abdomen is soft.  Lymphadenopathy:     Cervical: No cervical adenopathy.  Skin:    General: Skin is warm and dry.     Capillary Refill: Capillary refill takes less than 2 seconds.  Neurological:     Mental Status: She is alert and oriented to person, place, and time.  Psychiatric:        Behavior: Behavior normal.      Musculoskeletal Exam: C-spine thoracic and lumbar spine good range of motion.  She has no SI joint tenderness.  Shoulder joints elbow joints with good range of motion.  She has tenderness and swelling over her right right wrist extensors.  There was no tenderness over her MCPs PIPs or DIPs.  Hip joints with good range of motion.  She has warmth on palpation of bilateral knee joints with discomfort on range of motion.  Ankle joints MTPs PIPs DIPs been good range of motion  CDAI Exam: CDAI Score: Not documented Patient Global Assessment: Not documented; Provider Global Assessment: Not documented Swollen: Not documented; Tender: Not documented Joint Exam   Not documented   There is currently no information documented on the homunculus. Go to the Rheumatology activity and complete the homunculus joint exam.  Investigation: Findings:  02/07/18: RF<14, ANA-, CRP 1.1, Sed rate 27  Component     Latest Ref Rng & Units 02/07/2018  RA Latex Turbid.      <14 IU/mL <14  Anti Nuclear Antibody(ANA)     NEGATIVE NEGATIVE  CRP     0.5 - 20.0 mg/dL 1.1  Sed Rate     0 - 30 mm/hr 27   Imaging: Xr Hand 2 View Left  Result Date: 07/04/2018 Minimal PIP and DIP narrowing was noted.  Cystic versus erosive changes were noted in the carpal bones. Impression: These findings are consistent with mild osteoarthritis and possible inflammatory arthritis.  Xr Hand 2 View Right  Result Date: 07/04/2018 Minimal PIP and DIP narrowing was noted.  Cystic versus erosive changes were noted in the carpal bones. Impression: These findings are consistent with mild osteoarthritis and possible inflammatory arthritis.  Xr Knee 3 View Left  Result Date: 07/04/2018 Moderate medial compartment narrowing and moderate patellofemoral narrowing was noted.  No chondrocalcinosis was noted. Impression: These findings  are consistent with moderate osteoarthritis and moderate chondromalacia patella.  Xr Knee 3 View Right  Result Date: 07/04/2018 Moderate medial compartment narrowing and moderate patellofemoral narrowing was noted.  No chondrocalcinosis was noted. Impression: These findings are consistent with moderate osteoarthritis and moderate chondromalacia patella.   Recent Labs: Lab Results  Component Value Date   WBC 8.7 06/21/2018   HGB 12.9 06/21/2018   PLT 354.0 02/07/2018    Speciality Comments: No specialty comments available.  Procedures:  No procedures performed Allergies: Patient has no known allergies.   Assessment / Plan:     Visit Diagnoses: Polyarthralgia - 02/07/18: RF<14, ANA-, CRP 1.1, Sed rate 27 -patient complains of pain in multiple joints.  She had right hand extensor tenosynovitis and also tenderness over right wrist joint.  She has some tenderness in her left fifth PIP joint.  I will obtain following labs to look for any underlying inflammatory autoimmune process.  Plan: COMPLETE METABOLIC PANEL WITH GFR, CK, Cyclic citrul peptide antibody,  IgG, TSH, 14-3-3 eta Protein, Angiotensin converting enzyme, Uric acid  Bilateral hand pain -x-ray of bilateral hands showed mild osteoarthritis and cystic versus erosive changes in the carpal bones.  I will schedule ultrasound of her blood.  Plan: XR Hand 2 View Left, XR Hand 2 View Right  Chronic pain of both knees -she has been experiencing pain in her bilateral knee joints.  She has warmth on palpation of bilateral knees today.  Plan: XR KNEE 3 VIEW LEFT, XR KNEE 3 VIEW RIGHT.  The x-ray shows bilateral moderate osteoarthritis and moderate chondromalacia patella.  Other fatigue - Plan: TSH  Essential hypertension-blood pressure is well controlled.  History of asthma  Diaphragm dysfunction  Chronic rhinitis  Prurigo nodularis  History of gastroesophageal reflux (GERD)  History of depression     Orders: Orders Placed This Encounter  Procedures  . XR Hand 2 View Left  . XR Hand 2 View Right  . XR KNEE 3 VIEW LEFT  . XR KNEE 3 VIEW RIGHT  . COMPLETE METABOLIC PANEL WITH GFR  . CK  . Cyclic citrul peptide antibody, IgG  . TSH  . 14-3-3 eta Protein  . Angiotensin converting enzyme  . Uric acid   No orders of the defined types were placed in this encounter.   Face-to-face time spent with patient was 45 minutes. Greater than 50% of time was spent in counseling and coordination of care.  Follow-Up Instructions: Return for Polyarthralgia.   Bo Merino, MD  Note - This record has been created using Editor, commissioning.  Chart creation errors have been sought, but may not always  have been located. Such creation errors do not reflect on  the standard of medical care.

## 2018-06-25 LAB — CBC WITH DIFFERENTIAL
Basophils Absolute: 0.1 10*3/uL (ref 0.0–0.2)
Basos: 1 %
EOS (ABSOLUTE): 0.2 10*3/uL (ref 0.0–0.4)
Eos: 2 %
HEMOGLOBIN: 12.9 g/dL (ref 11.1–15.9)
Hematocrit: 38.8 % (ref 34.0–46.6)
Immature Grans (Abs): 0 10*3/uL (ref 0.0–0.1)
Immature Granulocytes: 1 %
LYMPHS ABS: 1.3 10*3/uL (ref 0.7–3.1)
Lymphs: 15 %
MCH: 29.4 pg (ref 26.6–33.0)
MCHC: 33.2 g/dL (ref 31.5–35.7)
MCV: 88 fL (ref 79–97)
MONOCYTES: 5 %
Monocytes Absolute: 0.4 10*3/uL (ref 0.1–0.9)
Neutrophils Absolute: 6.7 10*3/uL (ref 1.4–7.0)
Neutrophils: 76 %
RBC: 4.39 x10E6/uL (ref 3.77–5.28)
RDW: 12.8 % (ref 11.7–15.4)
WBC: 8.7 10*3/uL (ref 3.4–10.8)

## 2018-06-25 LAB — IGE: IgE (Immunoglobulin E), Serum: 16 IU/mL (ref 6–495)

## 2018-06-26 ENCOUNTER — Encounter: Payer: Self-pay | Admitting: Allergy

## 2018-06-28 ENCOUNTER — Other Ambulatory Visit: Payer: Self-pay | Admitting: Allergy and Immunology

## 2018-07-04 ENCOUNTER — Ambulatory Visit (INDEPENDENT_AMBULATORY_CARE_PROVIDER_SITE_OTHER): Payer: Self-pay

## 2018-07-04 ENCOUNTER — Ambulatory Visit (INDEPENDENT_AMBULATORY_CARE_PROVIDER_SITE_OTHER): Payer: 59 | Admitting: Rheumatology

## 2018-07-04 ENCOUNTER — Encounter: Payer: Self-pay | Admitting: Rheumatology

## 2018-07-04 ENCOUNTER — Other Ambulatory Visit: Payer: Self-pay

## 2018-07-04 VITALS — BP 127/84 | HR 87 | Resp 15 | Ht 65.5 in | Wt 285.4 lb

## 2018-07-04 DIAGNOSIS — M25562 Pain in left knee: Secondary | ICD-10-CM

## 2018-07-04 DIAGNOSIS — M25561 Pain in right knee: Secondary | ICD-10-CM

## 2018-07-04 DIAGNOSIS — M2550 Pain in unspecified joint: Secondary | ICD-10-CM

## 2018-07-04 DIAGNOSIS — G8929 Other chronic pain: Secondary | ICD-10-CM

## 2018-07-04 DIAGNOSIS — R5383 Other fatigue: Secondary | ICD-10-CM | POA: Diagnosis not present

## 2018-07-04 DIAGNOSIS — I10 Essential (primary) hypertension: Secondary | ICD-10-CM

## 2018-07-04 DIAGNOSIS — M79641 Pain in right hand: Secondary | ICD-10-CM

## 2018-07-04 DIAGNOSIS — Z8709 Personal history of other diseases of the respiratory system: Secondary | ICD-10-CM

## 2018-07-04 DIAGNOSIS — J310 Chronic rhinitis: Secondary | ICD-10-CM

## 2018-07-04 DIAGNOSIS — M79642 Pain in left hand: Secondary | ICD-10-CM | POA: Diagnosis not present

## 2018-07-04 DIAGNOSIS — L281 Prurigo nodularis: Secondary | ICD-10-CM

## 2018-07-04 DIAGNOSIS — J986 Disorders of diaphragm: Secondary | ICD-10-CM

## 2018-07-04 DIAGNOSIS — Z8659 Personal history of other mental and behavioral disorders: Secondary | ICD-10-CM

## 2018-07-04 DIAGNOSIS — Z8719 Personal history of other diseases of the digestive system: Secondary | ICD-10-CM

## 2018-07-09 LAB — COMPLETE METABOLIC PANEL WITH GFR
AG Ratio: 1.6 (calc) (ref 1.0–2.5)
ALT: 14 U/L (ref 6–29)
AST: 13 U/L (ref 10–35)
Albumin: 4.1 g/dL (ref 3.6–5.1)
Alkaline phosphatase (APISO): 74 U/L (ref 37–153)
BUN: 8 mg/dL (ref 7–25)
CO2: 28 mmol/L (ref 20–32)
Calcium: 9.1 mg/dL (ref 8.6–10.4)
Chloride: 103 mmol/L (ref 98–110)
Creat: 0.69 mg/dL (ref 0.50–1.05)
GFR, Est African American: 118 mL/min/{1.73_m2} (ref >=60)
GFR, Est Non African American: 102 mL/min/{1.73_m2} (ref >=60)
GLUCOSE: 87 mg/dL (ref 65–99)
Globulin: 2.6 g/dL (calc) (ref 1.9–3.7)
Potassium: 4.3 mmol/L (ref 3.5–5.3)
Sodium: 138 mmol/L (ref 135–146)
Total Bilirubin: 0.3 mg/dL (ref 0.2–1.2)
Total Protein: 6.7 g/dL (ref 6.1–8.1)

## 2018-07-09 LAB — CYCLIC CITRUL PEPTIDE ANTIBODY, IGG

## 2018-07-09 LAB — 14-3-3 ETA PROTEIN: 14-3-3 eta Protein: <0.2 ng/mL (ref <0.2)

## 2018-07-09 LAB — ANGIOTENSIN CONVERTING ENZYME: Angiotensin-Converting Enzyme: 36 U/L (ref 9–67)

## 2018-07-09 LAB — URIC ACID: Uric Acid, Serum: 3.4 mg/dL (ref 2.5–7.0)

## 2018-07-09 LAB — CK: Total CK: 30 U/L (ref 29–143)

## 2018-07-09 LAB — TSH: TSH: 0.9 mIU/L

## 2018-07-10 ENCOUNTER — Ambulatory Visit (INDEPENDENT_AMBULATORY_CARE_PROVIDER_SITE_OTHER): Payer: 59

## 2018-07-10 ENCOUNTER — Encounter: Payer: Self-pay | Admitting: Rheumatology

## 2018-07-10 ENCOUNTER — Ambulatory Visit (INDEPENDENT_AMBULATORY_CARE_PROVIDER_SITE_OTHER): Payer: 59 | Admitting: Rheumatology

## 2018-07-10 ENCOUNTER — Other Ambulatory Visit: Payer: Self-pay

## 2018-07-10 VITALS — BP 127/77 | HR 91 | Resp 14 | Ht 66.0 in | Wt 285.0 lb

## 2018-07-10 DIAGNOSIS — M79642 Pain in left hand: Secondary | ICD-10-CM

## 2018-07-10 DIAGNOSIS — M19041 Primary osteoarthritis, right hand: Secondary | ICD-10-CM

## 2018-07-10 DIAGNOSIS — Z8719 Personal history of other diseases of the digestive system: Secondary | ICD-10-CM

## 2018-07-10 DIAGNOSIS — M65831 Other synovitis and tenosynovitis, right forearm: Secondary | ICD-10-CM

## 2018-07-10 DIAGNOSIS — I10 Essential (primary) hypertension: Secondary | ICD-10-CM

## 2018-07-10 DIAGNOSIS — M19042 Primary osteoarthritis, left hand: Secondary | ICD-10-CM

## 2018-07-10 DIAGNOSIS — M170 Bilateral primary osteoarthritis of knee: Secondary | ICD-10-CM | POA: Diagnosis not present

## 2018-07-10 DIAGNOSIS — M79641 Pain in right hand: Secondary | ICD-10-CM

## 2018-07-10 DIAGNOSIS — L281 Prurigo nodularis: Secondary | ICD-10-CM

## 2018-07-10 DIAGNOSIS — Z8709 Personal history of other diseases of the respiratory system: Secondary | ICD-10-CM

## 2018-07-10 NOTE — Progress Notes (Signed)
Office Visit Note  Patient: Alyssa Clay             Date of Birth: [birth year]           MRN: [id number]             PCP: Caren Macadam, MD Referring: Caren Macadam, MD Visit Date: 07/10/2018 Occupation: @GUAROCC @  Subjective:  Pain in both hands    History of Present Illness: Alyssa Clay is a 53 y.o. female with history of osteoarthritis.  She states the discomfort in her hands is improved.  She continues to have some discomfort in her knee joints.  She also mentioned prior to the last visit she had low-grade fevers which resolved eventually.  She denies any joint swelling.  Activities of Daily Living:  Patient reports morning stiffness for 1 hour.   Patient Denies nocturnal pain.  Difficulty dressing/grooming: Denies Difficulty climbing stairs: Denies Difficulty getting out of chair: Reports Difficulty using hands for taps, buttons, cutlery, and/or writing: Denies  Review of Systems  Constitutional: Negative for fatigue, night sweats, weight gain and weight loss.  HENT: Negative for mouth sores, trouble swallowing, trouble swallowing, mouth dryness and nose dryness.   Eyes: Negative for pain, redness, visual disturbance and dryness.  Respiratory: Negative for cough, shortness of breath and difficulty breathing.   Cardiovascular: Positive for hypertension. Negative for chest pain, palpitations, irregular heartbeat and swelling in legs/feet.  Gastrointestinal: Negative for blood in stool, constipation and diarrhea.  Endocrine: Negative for increased urination.  Genitourinary: Negative for vaginal dryness.  Musculoskeletal: Positive for morning stiffness. Negative for arthralgias, joint pain, joint swelling, myalgias, muscle weakness, muscle tenderness and myalgias.  Skin: Negative for color change, rash, hair loss, skin tightness, ulcers and sensitivity to sunlight.  Allergic/Immunologic: Negative for susceptible to infections.  Neurological:  Negative for dizziness, memory loss, night sweats and weakness.  Hematological: Negative for swollen glands.  Psychiatric/Behavioral: Negative for depressed mood and sleep disturbance. The patient is not nervous/anxious.     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/2018   Moderate persistent asthma 01/07/2018   Chronic rhinitis 01/07/2018   Gastroesophageal reflux disease 01/07/2018    Past Medical History:  Diagnosis Date   Asthma    Depression    Hypertension    Migraine     Family History  Problem Relation Age of Onset   Cancer Mother 25       lymphoma   Depression Mother    Hypertension Father    Heart attack Father 39   Hypertension Brother    High blood pressure Brother    High Cholesterol Brother    Healthy Daughter    Past Surgical History:  Procedure Laterality Date   CESAREAN SECTION  1994   DILATION AND CURETTAGE OF UTERUS     multiple due to miscarriages   KNEE ARTHROSCOPY Left 2013   TENNIS ELBOW RELEASE/NIRSCHEL PROCEDURE  2009   WISDOM TOOTH EXTRACTION     Social History   Social History Narrative   Not on file   Immunization History  Administered Date(s) Administered   Pneumococcal Conjugate-13 01/24/2018     Objective: Vital Signs: BP 127/77 (BP Location: Left Arm, Patient Position: Sitting, Cuff Size: Large)  Pulse 91    Resp 14    Ht 5\' 6"  (1.676 m)    Wt 285 lb (129.3 kg)    BMI 46.00 kg/m    Physical Exam Vitals signs and nursing note reviewed.  Constitutional:      Appearance: She is well-developed.  HENT:     Head: Normocephalic and atraumatic.  Eyes:     Conjunctiva/sclera: Conjunctivae normal.  Neck:     Musculoskeletal: Normal  range of motion.  Cardiovascular:     Rate and Rhythm: Normal rate and regular rhythm.     Heart sounds: Normal heart sounds.  Pulmonary:     Effort: Pulmonary effort is normal.     Breath sounds: Normal breath sounds.  Abdominal:     General: Bowel sounds are normal.     Palpations: Abdomen is soft.  Lymphadenopathy:     Cervical: No cervical adenopathy.  Skin:    General: Skin is warm and dry.     Capillary Refill: Capillary refill takes less than 2 seconds.  Neurological:     Mental Status: She is alert and oriented to person, place, and time.  Psychiatric:        Behavior: Behavior normal.      Musculoskeletal Exam: C-spine thoracic and lumbar spine good range of motion.  Shoulder joints elbow joints wrist joint MCPs PIPs DIPs been good range of motion with no synovitis.  The tenosynovitis she had last visit has resolved.  Hip joints knee joints ankles MTPs PIPs with good range of motion with no synovitis.  CDAI Exam: CDAI Score: Not documented Patient Global Assessment: Not documented; Provider Global Assessment: Not documented Swollen: Not documented; Tender: Not documented Joint Exam   Not documented   There is currently no information documented on the homunculus. Go to the Rheumatology activity and complete the homunculus joint exam.  Investigation: No additional findings.  Imaging: [place] Extrem Up Bilat Comp  Result Date: 07/10/2018 Ultrasound examination of bilateral hands was performed per EULAR recommendations. Using 12 MHz transducer, grayscale and power Doppler bilateral second, third, and fifth MCP joints and bilateral wrist joints both dorsal and volar aspects were evaluated to look for synovitis or tenosynovitis. The findings were there was no synovitis or tenosynovitis on ultrasound examination. Right median nerve was 0.13 cm squares which was more than upper limits of normal and left median nerve was 0.10 cm squares which was within normal limits. Impression:  Ultrasound examination did not show any synovitis or tenosynovitis.  Right median nerve was mildly enlarged.  Xr Hand 2 View Left  Result Date: 07/04/2018 Minimal PIP and DIP narrowing was noted.  Cystic versus erosive changes were noted in the carpal bones. Impression: These findings are consistent with mild osteoarthritis and possible inflammatory arthritis.  Xr Hand 2 View Right  Result Date: 07/04/2018 Minimal PIP and DIP narrowing was noted.  Cystic versus erosive changes were noted in the carpal bones. Impression: These findings are consistent with mild osteoarthritis and possible inflammatory arthritis.  Xr Knee 3 View Left  Result Date: 07/04/2018 Moderate medial compartment narrowing and moderate patellofemoral narrowing was noted.  No chondrocalcinosis was noted. Impression: These findings are consistent with moderate osteoarthritis and moderate chondromalacia patella.  Xr Knee 3 View Right  Result Date: 07/04/2018 Moderate medial compartment narrowing and moderate patellofemoral narrowing was noted.  No chondrocalcinosis was noted. Impression: These findings are consistent with moderate osteoarthritis and moderate chondromalacia patella.   Recent Labs: Lab Results  Component Value Date  WBC 8.7 06/21/2018   HGB 12.9 06/21/2018   PLT 354.0 02/07/2018   NA 138 07/04/2018   K 4.3 07/04/2018   CL 103 07/04/2018   CO2 28 07/04/2018   GLUCOSE 87 07/04/2018   BUN 8 07/04/2018   CREATININE 0.69 07/04/2018   BILITOT 0.3 07/04/2018   AST 13 07/04/2018   ALT 14 07/04/2018   PROT 6.7 07/04/2018   CALCIUM 9.1 07/04/2018   GFRAA 118 07/04/2018  July 04, 2018 CK 30, TSH normal, uric acid 3.4, ACE 36, anti-CCP negative, 14 3 3  eta negative  Speciality Comments: No specialty comments available.  Procedures:  No procedures performed Allergies: Patient has no known allergies.   Assessment / Plan:     Visit Diagnoses: Pain in both hands -patient has some discomfort in her  hands.  The tenosynovitis is resolved.  The clinical findings are consistent with osteoarthritis.  The ultrasound examination today did not show any synovitis.  She has mild right median nerve enlargement but no symptoms of carpal tunnel syndrome.  Plan: [place] Extrem Up Bilat Comp  Extensor tenosynovitis of right wrist-resolved  Primary osteoarthritis of both hands - Mild.  Joint protection muscle strengthening was discussed.  Handout on hand exercises was given.  Primary osteoarthritis of both knees - Bilateral moderate osteoarthritis and moderate chondromalacia patella.  Joint protection muscle strengthening was discussed.  A handout on knee exercises was given.  Weight loss diet and exercise was discussed.  A list of natural anti-inflammatories was given.  Essential hypertension-her blood pressure is controlled.  Prurigo nodularis  History of asthma  History of gastroesophageal reflux (GERD)   Orders: Orders Placed This Encounter  Procedures   [place] Extrem Up Bilat Comp   No orders of the defined types were placed in this encounter.   Face-to-face time spent with patient was 30 minutes. Greater than 50% of time was spent in counseling and coordination of care.  Follow-Up Instructions: Return in about 1 year (around 07/10/2019) for Osteoarthritis.   Bo Merino, MD  Note - This record has been created using Editor, commissioning.  Chart creation errors have been sought, but may not always  have been located. Such creation errors do not reflect on  the standard of medical care.

## 2018-07-10 NOTE — Progress Notes (Deleted)
Office Visit Note  Patient: Alyssa Clay             Date of Birth: August 13, 1965           MRN: [id number]             PCP: Caren Macadam, MD Referring: Caren Macadam, MD Visit Date: 07/10/2018 Occupation: @GUAROCC @  Subjective:  No chief complaint on file.   History of Present Illness: Alyssa Clay is a 53 y.o. female ***   Activities of Daily Living:  Patient reports morning stiffness for *** {minute/hour:19697}.   Patient {ACTIONS;DENIES/REPORTS:21021675::"Denies"} nocturnal pain.  Difficulty dressing/grooming: {ACTIONS;DENIES/REPORTS:21021675::"Denies"} Difficulty climbing stairs: {ACTIONS;DENIES/REPORTS:21021675::"Denies"} Difficulty getting out of chair: {ACTIONS;DENIES/REPORTS:21021675::"Denies"} Difficulty using hands for taps, buttons, cutlery, and/or writing: {ACTIONS;DENIES/REPORTS:21021675::"Denies"}  No Rheumatology ROS completed.   PMFS History:  Patient Active Problem List   Diagnosis Date Noted  . Not well controlled severe persistent asthma 04/22/2018  . Hypertension 03/27/2018  . 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 Moderate persistent asthma 01/07/2018  . Chronic rhinitis 01/07/2018  . Gastroesophageal reflux disease 01/07/2018    Past Medical History:  Diagnosis Date  . Asthma   . Depression   . Hypertension   . Migraine     Family History  Problem Relation Age of Onset  . Cancer Mother 11       lymphoma  . Depression Mother   . Hypertension Father   . Heart attack Father 37  . Hypertension Brother   . High blood pressure Brother   . High Cholesterol Brother   . Healthy Daughter    Past Surgical History:  Procedure Laterality Date  . CESAREAN SECTION  1994  . DILATION AND CURETTAGE OF UTERUS     multiple due to miscarriages  . KNEE ARTHROSCOPY Left 2013  . TENNIS ELBOW RELEASE/NIRSCHEL PROCEDURE  2009  . WISDOM TOOTH EXTRACTION     Social History   Social History  Narrative  . Not on file   Immunization History  Administered Date(s) Administered  . Pneumococcal Conjugate-13 01/24/2018     Objective: Vital Signs: There were no vitals taken for this visit.   Physical Exam   Musculoskeletal Exam: ***  CDAI Exam: CDAI Score: Not documented Patient Global Assessment: Not documented; Provider Global Assessment: Not documented Swollen: Not documented; Tender: Not documented Joint Exam   Not documented   There is currently no information documented on the homunculus. Go to the Rheumatology activity and complete the homunculus joint exam.  Investigation: No additional findings.  Imaging: Xr Hand 2 View Left  Result Date: 07/04/2018 Minimal PIP and DIP narrowing was noted.  Cystic versus erosive changes were noted in the carpal bones. Impression: These findings are consistent with mild osteoarthritis and possible inflammatory arthritis.  Xr Hand 2 View Right  Result Date: 07/04/2018 Minimal PIP and DIP narrowing was noted.  Cystic versus erosive changes were noted in the carpal bones. Impression: These findings are consistent with mild osteoarthritis and possible inflammatory arthritis.  Xr Knee 3 View Left  Result Date: 07/04/2018 Moderate medial compartment narrowing and moderate patellofemoral narrowing was noted.  No chondrocalcinosis was noted. Impression: These findings are consistent with moderate osteoarthritis and moderate chondromalacia patella.  Xr Knee 3 View Right  Result Date: 07/04/2018 Moderate medial compartment narrowing and moderate patellofemoral narrowing was noted.  No chondrocalcinosis was noted. Impression: These findings are consistent with moderate osteoarthritis and moderate chondromalacia patella.   Recent Labs: Lab Results  Component Value  Date   WBC 8.7 06/21/2018   HGB 12.9 06/21/2018   PLT 354.0 02/07/2018   NA 138 07/04/2018   K 4.3 07/04/2018   CL 103 07/04/2018   CO2 28 07/04/2018   GLUCOSE 87  07/04/2018   BUN 8 07/04/2018   CREATININE 0.69 07/04/2018   BILITOT 0.3 07/04/2018   AST 13 07/04/2018   ALT 14 07/04/2018   PROT 6.7 07/04/2018   CALCIUM 9.1 07/04/2018   GFRAA 118 07/04/2018    Speciality Comments: No specialty comments available.  Procedures:  No procedures performed Allergies: Patient has no known allergies.   Assessment / Plan:     Visit Diagnoses: Pain in both hands - Plan: [place] Extrem Up Bilat Comp   Orders: Orders Placed This Encounter  Procedures  . [place] Extrem Up Bilat Comp   No orders of the defined types were placed in this encounter.   Face-to-face time spent with patient was *** minutes. Greater than 50% of time was spent in counseling and coordination of care.  Follow-Up Instructions: No follow-ups on file.   Earnestine Mealing, CMA  Note - This record has been created using Editor, commissioning.  Chart creation errors have been sought, but may not always  have been located. Such creation errors do not reflect on  the standard of medical care.

## 2018-07-10 NOTE — Patient Instructions (Signed)
Knee Exercises Ask your health care provider which exercises are safe for you. Do exercises exactly as told by your health care provider and adjust them as directed. It is normal to feel mild stretching, pulling, tightness, or discomfort as you do these exercises, but you should stop right away if you feel sudden pain or your pain gets worse.Do not begin these exercises until told by your health care provider. STRETCHING AND RANGE OF MOTION EXERCISES These exercises warm up your muscles and joints and improve the movement and flexibility of your knee. These exercises also help to relieve pain, numbness, and tingling. Exercise A: Knee Extension, Prone 1. Lie on your abdomen on a bed. 2. Place your left / right knee just beyond the edge of the surface so your knee is not on the bed. You can put a towel under your left / right thigh just above your knee for comfort. 3. Relax your leg muscles and allow gravity to straighten your knee. You should feel a stretch behind your left / right knee. 4. Hold this position for __________ seconds. 5. Scoot up so your knee is supported between repetitions. Repeat __________ times. Complete this stretch __________ times a day. Exercise B: Knee Flexion, Active  1. Lie on your back with both knees straight. If this causes back discomfort, bend your left / right knee so your foot is flat on the floor. 2. Slowly slide your left / right heel back toward your buttocks until you feel a gentle stretch in the front of your knee or thigh. 3. Hold this position for __________ seconds. 4. Slowly slide your left / right heel back to the starting position. Repeat __________ times. Complete this exercise __________ times a day. Exercise C: Quadriceps, Prone  1. Lie on your abdomen on a firm surface, such as a bed or padded floor. 2. Bend your left / right knee and hold your ankle. If you cannot reach your ankle or pant leg, loop a belt around your foot and grab the belt  instead. 3. Gently pull your heel toward your buttocks. Your knee should not slide out to the side. You should feel a stretch in the front of your thigh and knee. 4. Hold this position for __________ seconds. Repeat __________ times. Complete this stretch __________ times a day. Exercise D: Hamstring, Supine 1. Lie on your back. 2. Loop a belt or towel over the ball of your left / right foot. The ball of your foot is on the walking surface, right under your toes. 3. Straighten your left / right knee and slowly pull on the belt to raise your leg until you feel a gentle stretch behind your knee. ? Do not let your left / right knee bend while you do this. ? Keep your other leg flat on the floor. 4. Hold this position for __________ seconds. Repeat __________ times. Complete this stretch __________ times a day. STRENGTHENING EXERCISES These exercises build strength and endurance in your knee. Endurance is the ability to use your muscles for a long time, even after they get tired. Exercise E: Quadriceps, Isometric  1. Lie on your back with your left / right leg extended and your other knee bent. Put a rolled towel or small pillow under your knee if told by your health care provider. 2. Slowly tense the muscles in the front of your left / right thigh. You should see your kneecap slide up toward your hip or see increased dimpling just above the knee. This   motion will push the back of the knee toward the floor. 3. For __________ seconds, keep the muscle as tight as you can without increasing your pain. 4. Relax the muscles slowly and completely. Repeat __________ times. Complete this exercise __________ times a day. Exercise F: Straight Leg Raises - Quadriceps 1. Lie on your back with your left / right leg extended and your other knee bent. 2. Tense the muscles in the front of your left / right thigh. You should see your kneecap slide up or see increased dimpling just above the knee. Your thigh may  even shake a bit. 3. Keep these muscles tight as you raise your leg 4-6 inches (10-15 cm) off the floor. Do not let your knee bend. 4. Hold this position for __________ seconds. 5. Keep these muscles tense as you lower your leg. 6. Relax your muscles slowly and completely after each repetition. Repeat __________ times. Complete this exercise __________ times a day. Exercise G: Hamstring, Isometric 1. Lie on your back on a firm surface. 2. Bend your left / right knee approximately __________ degrees. 3. Dig your left / right heel into the surface as if you are trying to pull it toward your buttocks. Tighten the muscles in the back of your thighs to dig as hard as you can without increasing any pain. 4. Hold this position for __________ seconds. 5. Release the tension gradually and allow your muscles to relax completely for __________ seconds after each repetition. Repeat __________ times. Complete this exercise __________ times a day. Exercise H: Hamstring Curls  If told by your health care provider, do this exercise while wearing ankle weights. Begin with __________ weights. Then increase the weight by 1 lb (0.5 kg) increments. Do not wear ankle weights that are more than __________. 1. Lie on your abdomen with your legs straight. 2. Bend your left / right knee as far as you can without feeling pain. Keep your hips flat against the floor. 3. Hold this position for __________ seconds. 4. Slowly lower your leg to the starting position.  Repeat __________ times. Complete this exercise __________ times a day. Exercise I: Squats (Quadriceps) 1. Stand in front of a table, with your feet and knees pointing straight ahead. You may rest your hands on the table for balance but not for support. 2. Slowly bend your knees and lower your hips like you are going to sit in a chair. ? Keep your weight over your heels, not over your toes. ? Keep your lower legs upright so they are parallel with the table  legs. ? Do not let your hips go lower than your knees. ? Do not bend lower than told by your health care provider. ? If your knee pain increases, do not bend as low. 3. Hold the squat position for __________ seconds. 4. Slowly push with your legs to return to standing. Do not use your hands to pull yourself to standing. Repeat __________ times. Complete this exercise __________ times a day. Exercise J: Wall Slides (Quadriceps)  1. Lean your back against a smooth wall or door while you walk your feet out 18-24 inches (46-61 cm) from it. 2. Place your feet hip-width apart. 3. Slowly slide down the wall or door until your knees bend __________ degrees. Keep your knees over your heels, not over your toes. Keep your knees in line with your hips. 4. Hold for __________ seconds. Repeat __________ times. Complete this exercise __________ times a day. Exercise K: Straight Leg Raises -   Hip Abductors 1. Lie on your side with your left / right leg in the top position. Lie so your head, shoulder, knee, and hip line up. You may bend your bottom knee to help you keep your balance. 2. Roll your hips slightly forward so your hips are stacked directly over each other and your left / right knee is facing forward. 3. Leading with your heel, lift your top leg 4-6 inches (10-15 cm). You should feel the muscles in your outer hip lifting. ? Do not let your foot drift forward. ? Do not let your knee roll toward the ceiling. 4. Hold this position for __________ seconds. 5. Slowly return your leg to the starting position. 6. Let your muscles relax completely after each repetition. Repeat __________ times. Complete this exercise __________ times a day. Exercise L: Straight Leg Raises - Hip Extensors 1. Lie on your abdomen on a firm surface. You can put a pillow under your hips if that is more comfortable. 2. Tense the muscles in your buttocks and lift your left / right leg about 4-6 inches (10-15 cm). Keep your knee  straight as you lift your leg. 3. Hold this position for __________ seconds. 4. Slowly lower your leg to the starting position. 5. Let your leg relax completely after each repetition. Repeat __________ times. Complete this exercise __________ times a day. This information is not intended to replace advice given to you by your health care provider. Make sure you discuss any questions you have with your health care provider. Document Released: 02/22/2005 Document Revised: 01/03/2016 Document Reviewed: 02/14/2015 Else[employer] Interactive Patient Education  2018 [employer] Hand Exercises Hand exercises can be helpful to almost anyone. These exercises can strengthen the hands, improve flexibility and movement, and increase blood flow to the hands. These results can make work and daily tasks easier. Hand exercises can be especially helpful for people who have joint pain from arthritis or have nerve damage from overuse (carpal tunnel syndrome). These exercises can also help people who have injured a hand. Most of these hand exercises are fairly gentle stretching routines. You can do them often throughout the day. Still, it is a good idea to ask your health care provider which exercises would be best for you. Warming your hands before exercise may help to reduce stiffness. You can do this with gentle massage or by placing your hands in warm water for 15 minutes. Also, make sure you pay attention to your level of hand pain as you begin an exercise routine. Exercises Knuckle bend Repeat this exercise 5-10 times with each hand. 1. Stand or sit with your arm, hand, and all five fingers pointed straight up. Make sure your wrist is straight. 2. Gently and slowly bend your fingers down and inward until the tips of your fingers are touching the tops of your palm. 3. Hold this position for a few seconds. 4. Extend your fingers out to their original position, all pointing straight up again. Finger fan Repeat this  exercise 5-10 times with each hand. 1. Hold your arm and hand out in front of you. Keep your wrist straight. 2. Squeeze your hand into a fist. 3. Hold this position for a few seconds. 4. Fan out, or spread apart, your hand and fingers as much as possible, stretching every joint fully. Tabletop Repeat this exercise 5-10 times with each hand. 1. Stand or sit with your arm, hand, and all five fingers pointed straight up. Make sure your wrist is straight. 2. Gently   and slowly bend your fingers at the knuckles where they meet the hand until your hand is making an upside-down L shape. Your fingers should form a tabletop. 3. Hold this position for a few seconds. 4. Extend your fingers out to their original position, all pointing straight up again. Making Os Repeat this exercise 5-10 times with each hand. 1. Stand or sit with your arm, hand, and all five fingers pointed straight up. Make sure your wrist is straight. 2. Make an O shape by touching your pointer finger to your thumb. Hold for a few seconds. Then open your hand wide. 3. Repeat this motion with each finger on your hand. Table spread Repeat this exercise 5-10 times with each hand. 1. Place your hand on a table with your palm facing down. Make sure your wrist is straight. 2. Spread your fingers out as much as possible. Hold this position for a few seconds. 3. Slide your fingers back together again. Hold for a few seconds. Ball grip Repeat this exercise 10-15 times with each hand. 1. Hold a tennis ball or another soft ball in your hand. 2. While slowly increasing pressure, squeeze the ball as hard as possible. 3. Squeeze as hard as you can for 3-5 seconds. 4. Relax and repeat.  Wrist curls Repeat this exercise 10-15 times with each hand. 1. Sit in a chair that has armrests. 2. Hold a light weight in your hand, such as a dumbbell that weighs 1-3 pounds (0.5-1.4 kg). Ask your health care provider what weight would be best for you. 3.  Rest your hand just over the end of the chair arm with your palm facing up. 4. Gently pivot your wrist up and down while holding the weight. Do not twist your wrist from side to side. Contact a health care provider if:  Your hand pain or discomfort gets much worse when you do an exercise.  Your hand pain or discomfort does not improve within 2 hours after you exercise. If you have any of these problems, stop doing these exercises right away. Do not do them again unless your health care provider says that you can. Get help right away if:  You develop sudden, severe hand pain. If this happens, stop doing these exercises right away. Do not do them again unless your health care provider says that you can. This information is not intended to replace advice given to you by your health care provider. Make sure you discuss any questions you have with your health care provider. Document Released: 03/22/2015 Document Revised: 08/14/2017 Document Reviewed: 10/19/2014 Elsevier Interactive Patient Education  2019 [employer]  

## 2018-07-11 ENCOUNTER — Other Ambulatory Visit: Payer: Self-pay | Admitting: Family Medicine

## 2018-07-11 ENCOUNTER — Other Ambulatory Visit: Payer: Self-pay | Admitting: Allergy and Immunology

## 2018-07-16 ENCOUNTER — Encounter: Payer: Self-pay | Admitting: Family Medicine

## 2018-07-16 NOTE — Telephone Encounter (Signed)
Not on current med list. Ok to refill?

## 2018-07-17 ENCOUNTER — Other Ambulatory Visit: Payer: Self-pay | Admitting: Family Medicine

## 2018-07-17 MED ORDER — CLOTRIMAZOLE-BETAMETHASONE 1-0.05 % EX CREA: 1.0000 "application " | TOPICAL_CREAM | Freq: Two times a day (BID) | CUTANEOUS | 2 refills | Status: DC

## 2018-07-24 MED ORDER — CLOTRIMAZOLE-BETAMETHASONE 1-0.05 % EX CREA: 1.0000 "application " | TOPICAL_CREAM | Freq: Two times a day (BID) | CUTANEOUS | 2 refills | Status: DC

## 2018-07-24 NOTE — Addendum Note (Signed)
Addended by: Rebecca Eaton on: 07/24/2018 09:46 AM   Modules accepted: Orders

## 2018-07-27 ENCOUNTER — Encounter: Payer: Self-pay | Admitting: Family Medicine

## 2018-07-29 ENCOUNTER — Other Ambulatory Visit: Payer: Self-pay

## 2018-07-29 ENCOUNTER — Encounter: Payer: Self-pay | Admitting: Family Medicine

## 2018-07-29 ENCOUNTER — Ambulatory Visit (INDEPENDENT_AMBULATORY_CARE_PROVIDER_SITE_OTHER): Payer: 59 | Admitting: Family Medicine

## 2018-07-29 ENCOUNTER — Other Ambulatory Visit: Payer: 59

## 2018-07-29 DIAGNOSIS — M25551 Pain in right hip: Secondary | ICD-10-CM | POA: Diagnosis not present

## 2018-07-29 MED ORDER — MELOXICAM 7.5 MG PO TABS: 7.5000 mg | ORAL_TABLET | Freq: Every day | ORAL | 0 refills | Status: DC

## 2018-07-29 MED ORDER — METHOCARBAMOL 500 MG PO TABS: 500.0000 mg | ORAL_TABLET | Freq: Two times a day (BID) | ORAL | 0 refills | Status: DC | PRN

## 2018-07-29 NOTE — Telephone Encounter (Signed)
Please advise. Dr. Koberlein is out of the office. 

## 2018-07-29 NOTE — Progress Notes (Signed)
Virtual Visit via Video Note  I connected with Alyssa Clay on 07/29/18 at 10:30 AM EDT by a video enabled telemedicine application and verified that I am speaking with the correct person using two identifiers.  Location patient: home Location provider:work or home office Persons participating in the virtual visit: patient, provider  I discussed the limitations of evaluation and management by telemedicine and the availability of in person appointments. The patient expressed understanding and agreed to proceed.   HPI: Alyssa Clay for a walk last Thursday, felt a slight twinge in her R hip.  Has become worse. Now with a severe pain, not shooting, feels like "I pulled something".  Feels "worse than when I had a c-section".  Motrin didn't help.  Flexeril and zanaflex has not helped (takes for migraines).  Pain stays in hip.  Never had any hip problems in the past.  Unsure if has any swelling.  Once standing can walk, but if sitting unable to get comfortable.  Notes difficulty wiping after using the restroom 2/2 discomfort.  Denies fever, dysuria, back pain, popping, clicking, tearing sensation.   ROS: See pertinent positives and negatives per HPI.  Past Medical History:  Diagnosis Date  . Asthma   . Depression   . Hypertension   . Migraine     Past Surgical History:  Procedure Laterality Date  . CESAREAN SECTION  1994  . DILATION AND CURETTAGE OF UTERUS     multiple due to miscarriages  . KNEE ARTHROSCOPY Left 2013  . TENNIS ELBOW RELEASE/NIRSCHEL PROCEDURE  2009  . WISDOM TOOTH EXTRACTION      Family History  Problem Relation Age of Onset  . Cancer Mother 81       lymphoma  . Depression Mother   . Hypertension Father   . Heart attack Father 73  . Hypertension Brother   . High blood pressure Brother   . High Cholesterol Brother   . Healthy Daughter       Current Outpatient Medications:  .  albuterol (PROVENTIL HFA;VENTOLIN HFA) 108 (90 Base) MCG/ACT inhaler, Inhale 2 puffs  into the lungs every 6 (six) hours as needed for wheezing or shortness of breath., Disp: 1 Inhaler, Rfl: 1 .  albuterol (PROVENTIL) (2.5 MG/3ML) 0.083% nebulizer solution, Take 2.5 mg by nebulization every 6 (six) hours as needed for wheezing or shortness of breath., Disp: , Rfl:  .  Albuterol Sulfate (PROAIR RESPICLICK) 409 (90 Base) MCG/ACT AEPB, Inhale 2 puffs into the lungs every 4 (four) hours as needed., Disp: 1 each, Rfl: 5 .  Azelastine HCl 0.15 % SOLN, Place 1 spray into both nostrils 2 (two) times daily., Disp: 30 mL, Rfl: 5 .  budesonide-formoterol (SYMBICORT) 160-4.5 MCG/ACT inhaler, Inhale 2 puffs into the lungs 2 (two) times daily., Disp: 1 Inhaler, Rfl: 5 .  calcium carbonate (OSCAL) 1500 (600 Ca) MG TABS tablet, Take 600 mg of elemental calcium by mouth 2 (two) times daily with a meal., Disp: , Rfl:  .  clotrimazole-betamethasone (LOTRISONE) cream, Apply 1 application topically 2 (two) times daily., Disp: 45 g, Rfl: 2 .  DULoxetine (CYMBALTA) 60 MG capsule, Take 1 capsule (60 mg total) by mouth daily., Disp: 90 capsule, Rfl: 1 .  Elastic Bandages & Supports (WRIST SPLINT/COCK-UP/LEFT L) MISC, 1 Device by Does not apply route daily., Disp: 1 each, Rfl: 0 .  Erenumab-aooe (AIMOVIG) 70 MG/ML SOAJ, Inject into the skin., Disp: , Rfl:  .  fluconazole (DIFLUCAN) 150 MG tablet, Take 1 pill now.  Take  second pill in 3 days if symptoms continue. (Patient not taking: Reported on 07/04/2018), Disp: 2 tablet, Rfl: 0 .  fluticasone (FLONASE) 50 MCG/ACT nasal spray, Place 1 spray into both nostrils 2 (two) times daily., Disp: 16 g, Rfl: 5 .  gabapentin (NEURONTIN) 300 MG capsule, , Disp: , Rfl:  .  ketoconazole (NIZORAL) 2 % cream, APPLY TO AFFECTED AREA TWICE A DAY UNTIL 1 WK POST CLEAR, Disp: , Rfl: 2 .  losartan (COZAAR) 50 MG tablet, TAKE 1 TABLET BY MOUTH EVERY DAY, Disp: 90 tablet, Rfl: 0 .  montelukast (SINGULAIR) 10 MG tablet, TAKE 1 TABLET BY MOUTH EVERY DAY, Disp: 30 tablet, Rfl: 0 .   Multiple Vitamin (MULTIVITAMIN WITH MINERALS) TABS tablet, Take 1 tablet by mouth daily., Disp: , Rfl:  .  pantoprazole (PROTONIX) 40 MG tablet, Take 1 tablet (40 mg total) by mouth 2 (two) times daily before a meal., Disp: 30 tablet, Rfl: 5 .  Probiotic Product (PROBIOTIC PO), Take 1 capsule by mouth daily., Disp: , Rfl:  .  Riboflavin-Magnesium-Feverfew (MIGRELIEF) 200-180-50 MG TABS, Take by mouth., Disp: , Rfl:  .  rizatriptan (MAXALT) 10 MG tablet, Take 10 mg by mouth as needed for migraine. May repeat in 2 hours if needed, Disp: , Rfl:  .  Radiation protection practitioner (GUARDIAN SHARPS COLLECTOR) MISC, Use with injection., Disp: , Rfl:  .  Tiotropium Bromide Monohydrate (SPIRIVA RESPIMAT) 1.25 MCG/ACT AERS, Inhale 2 puffs into the lungs daily., Disp: 4 g, Rfl: 5 .  tiZANidine (ZANAFLEX) 2 MG tablet, TAKE 1-2 TABS (2-4MG ) BY MOUTH EVERY 6-8 HOURS AS NEEDED (USE UP TO 3 TIMES DAILY), Disp: , Rfl: 3 .  traZODone (DESYREL) 100 MG tablet, Take 1 tablet (100 mg total) by mouth at bedtime as needed for sleep., Disp: 90 tablet, Rfl: 1 .  triamcinolone cream (KENALOG) 0.1 %, Apply 1 application topically daily as needed., Disp: , Rfl:  .  valACYclovir (VALTREX) 1000 MG tablet, Take 1 tablet (1,000 mg total) by mouth 2 (two) times daily., Disp: 18 tablet, Rfl: 5  EXAM:  VITALS per patient if applicable:  RR between 12-20 bpm  GENERAL: alert, oriented, appears well and in no acute distress  HEENT: atraumatic, conjunctiva clear, no obvious abnormalities on inspection of external nose and ears  NECK: normal movements of the head and neck  LUNGS: on inspection no signs of respiratory distress, breathing rate appears normal, no obvious gross SOB, gasping or wheezing  CV: no obvious cyanosis  MS: moves all visible extremities without noticeable abnormality.  PSYCH/NEURO: pleasant and cooperative, no obvious depression or anxiety, speech and thought processing grossly intact  ASSESSMENT AND PLAN:  Discussed  the following assessment and plan:  Acute right hip pain  -possibly 2/2 msk strain, arthritis (has h/o OA in other joints) -discussed heat/ice, stretching, ways to avoid getting stiff. - Plan: meloxicam (MOBIC) 7.5 MG tablet, methocarbamol (ROBAXIN) 500 MG tablet -f/u prn for continued symptoms    I discussed the assessment and treatment plan with the patient. The patient was provided an opportunity to ask questions and all were answered. The patient agreed with the plan and demonstrated an understanding of the instructions.   The patient was advised to call back or seek an in-person evaluation if the symptoms worsen or if the condition fails to improve as anticipated.    Billie Ruddy, MD

## 2018-07-30 ENCOUNTER — Ambulatory Visit: Payer: 59 | Admitting: Rheumatology

## 2018-07-30 NOTE — Telephone Encounter (Signed)
Left message for patient to call back. CRM created 

## 2018-07-30 NOTE — Telephone Encounter (Signed)
Patient has already been seen. 

## 2018-08-19 ENCOUNTER — Other Ambulatory Visit: Payer: Self-pay | Admitting: Allergy and Immunology

## 2018-08-20 ENCOUNTER — Other Ambulatory Visit: Payer: Self-pay | Admitting: Family Medicine

## 2018-08-20 DIAGNOSIS — M25551 Pain in right hip: Secondary | ICD-10-CM

## 2018-09-06 ENCOUNTER — Other Ambulatory Visit: Payer: Self-pay

## 2018-09-06 ENCOUNTER — Ambulatory Visit (INDEPENDENT_AMBULATORY_CARE_PROVIDER_SITE_OTHER): Payer: 59 | Admitting: Family Medicine

## 2018-09-06 ENCOUNTER — Encounter: Payer: Self-pay | Admitting: Family Medicine

## 2018-09-06 ENCOUNTER — Ambulatory Visit: Payer: Self-pay | Admitting: *Deleted

## 2018-09-06 DIAGNOSIS — H00012 Hordeolum externum right lower eyelid: Secondary | ICD-10-CM

## 2018-09-06 MED ORDER — GENTAMICIN SULFATE 0.3 % OP OINT: TOPICAL_OINTMENT | Freq: Three times a day (TID) | OPHTHALMIC | 0 refills | Status: DC

## 2018-09-06 NOTE — Telephone Encounter (Signed)
Summary: stye on right eye   Patient is calling in stating she has a swollen stye on her right eye. States it appeared 2 days ago and today it is the worst day. Patient expressed that the hot wash rag has not helped and she is seeking advice.      Attempted to call office- call dropped- call to office- information given to schedule patient- patient notified she will be contacted by office.  Reason for Disposition . [1] SEVERE eyelid swelling on one side AND [2] red and painful (or tender to touch)  Answer Assessment - Initial Assessment Questions 1. ONSET: "When did the swelling start?" (e.g., minutes, hours, days)     Appeared 2 days ago 2. LOCATION: "What part of the eyelids is swollen?"     Lower lid R eye 3. SEVERITY: "How swollen is it?"     Eye lid is swollen- not effecting sight at this time 4. ITCHING: "Is there any itching?" If so, ask: "How much?"   (Scale 1-10; mild, moderate or severe)     Yes- mild 5. PAIN: "Is the swelling painful to touch?" If so, ask: "How painful is it?"   (Scale 1-10; mild, moderate or severe)     Yes- 7-8 6. FEVER: "Do you have a fever?" If so, ask: "What is it, how was it measured, and when did it start?"      no 7. CAUSE: "What do you think is causing the swelling?"     Stye- patient has history- never this bad before 8. RECURRENT SYMPTOM: "Have you had eyelid swelling before?" If so, ask: "When was the last time?" "What happened that time?"     Yes- couple years ago- warm moist heat made it go away 9. OTHER SYMPTOMS: "Do you have any other symptoms?" (e.g., blurred vision, eye discharge, rash, runny nose)     no 10. PREGNANCY: "Is there any chance you are pregnant?" "When was your last menstrual period?"       n/a  Protocols used: EYE - [facility]

## 2018-09-06 NOTE — Telephone Encounter (Signed)
Virtual appointment has been made

## 2018-09-06 NOTE — Progress Notes (Signed)
Virtual Visit via Video Note  I connected with Alyssa Clay  on 09/06/18 at  3:30 PM EDT by a video enabled telemedicine application and verified that I am speaking with the correct person using two identifiers.  Location patient: home Location provider:work or home office Persons participating in the virtual visit: patient, provider  I discussed the limitations of evaluation and management by telemedicine and the availability of in person appointments. The patient expressed understanding and agreed to proceed.   Alyssa Clay DOB: Sep 15, 1965 Encounter date: 09/06/2018  This is a 53 y.o. female who presents with Chief Complaint  Patient presents with  . Eye Problem    Patient complains of a large stye on the right eye x3days, used warm compresses with no relief    History of present illness:  HPI  Two days ago started with itching below right eye. Then more painful yesterday. Burning, itching, painful. Watering of eye. Eye itself not red. No relief with warm compresses. Not sure why this occurred. Was afraid to put anything in eye.   Warm to touch; swollen just localized.   No pain with movement of eye itself, but feels sensitive under lid.   Has had bad migraine for 2 days. Hasn't checked temp, but not thinking fever. Has had bad month with migraines.   No Known Allergies Current Meds  Medication Sig  . albuterol (PROVENTIL HFA;VENTOLIN HFA) 108 (90 Base) MCG/ACT inhaler Inhale 2 puffs into the lungs every 6 (six) hours as needed for wheezing or shortness of breath.  Marland Kitchen albuterol (PROVENTIL) (2.5 MG/3ML) 0.083% nebulizer solution Take 2.5 mg by nebulization every 6 (six) hours as needed for wheezing or shortness of breath.  . budesonide-formoterol (SYMBICORT) 160-4.5 MCG/ACT inhaler Inhale 2 puffs into the lungs 2 (two) times daily.  . calcium carbonate (OSCAL) 1500 (600 Ca) MG TABS tablet Take 600 mg of elemental calcium by mouth 2 (two) times daily with a  meal.  . clotrimazole-betamethasone (LOTRISONE) cream Apply 1 application topically 2 (two) times daily.  . DULoxetine (CYMBALTA) 60 MG capsule Take 1 capsule (60 mg total) by mouth daily.  Eduard Roux (AIMOVIG) 70 MG/ML SOAJ Inject into the skin.  . fluconazole (DIFLUCAN) 150 MG tablet Take 1 pill now.  Take second pill in 3 days if symptoms continue.  . fluticasone (FLONASE) 50 MCG/ACT nasal spray Place 1 spray into both nostrils 2 (two) times daily.  Marland Kitchen gabapentin (NEURONTIN) 300 MG capsule   . ketoconazole (NIZORAL) 2 % cream APPLY TO AFFECTED AREA TWICE A DAY UNTIL 1 WK POST CLEAR  . losartan (COZAAR) 50 MG tablet TAKE 1 TABLET BY MOUTH EVERY DAY  . meloxicam (MOBIC) 7.5 MG tablet TAKE 1 TABLET BY MOUTH EVERY DAY (Patient taking differently: as needed. )  . methocarbamol (ROBAXIN) 500 MG tablet Take 1 tablet (500 mg total) by mouth 2 (two) times daily as needed for muscle spasms.  . montelukast (SINGULAIR) 10 MG tablet TAKE 1 TABLET BY MOUTH EVERY DAY  . Multiple Vitamin (MULTIVITAMIN WITH MINERALS) TABS tablet Take 1 tablet by mouth daily.  . pantoprazole (PROTONIX) 40 MG tablet Take 1 tablet (40 mg total) by mouth 2 (two) times daily before a meal. (Patient taking differently: Take 40 mg by mouth daily. )  . Probiotic Product (PROBIOTIC PO) Take 1 capsule by mouth daily.  . Riboflavin-Magnesium-Feverfew (MIGRELIEF) 200-180-50 MG TABS Take by mouth.  . rizatriptan (MAXALT) 10 MG tablet Take 10 mg by mouth as needed for migraine. May repeat  in 2 hours if needed  . Radiation protection practitioner (GUARDIAN SHARPS COLLECTOR) MISC Use with injection.  . Tiotropium Bromide Monohydrate (SPIRIVA RESPIMAT) 1.25 MCG/ACT AERS Inhale 2 puffs into the lungs daily.  Marland Kitchen tiZANidine (ZANAFLEX) 2 MG tablet TAKE 1-2 TABS (2-4MG ) BY MOUTH EVERY 6-8 HOURS AS NEEDED (USE UP TO 3 TIMES DAILY)  . traZODone (DESYREL) 100 MG tablet Take 1 tablet (100 mg total) by mouth at bedtime as needed for sleep.  Marland Kitchen triamcinolone cream  (KENALOG) 0.1 % Apply 1 application topically daily as needed.  . valACYclovir (VALTREX) 1000 MG tablet Take 1 tablet (1,000 mg total) by mouth 2 (two) times daily.    Review of Systems  Constitutional: Negative for chills, fatigue and fever.  Respiratory: Negative for cough, chest tightness, shortness of breath and wheezing.   Cardiovascular: Negative for chest pain, palpitations and leg swelling.    Objective:  There were no vitals taken for this visit.      BP Readings from Last 3 Encounters:  07/10/18 127/77  07/04/18 127/84  05/17/18 120/62   Wt Readings from Last 3 Encounters:  07/10/18 285 lb (129.3 kg)  07/04/18 285 lb 6.4 oz (129.5 kg)  05/17/18 286 lb (129.7 kg)    EXAM:  GENERAL: alert, oriented, appears well and in no acute distress  HEENT: atraumatic, no obvious abnormalities on inspection of external nose and ears.  EYES: there is localized erythema and slight edema mid right lower lid. Normal eye movement. Conjunctiva clear. No obvious upper lid abnormality.  NECK: normal movements of the head and neck  LUNGS: on inspection no signs of respiratory distress, breathing rate appears normal, no obvious gross SOB, gasping or wheezing  CV: no obvious cyanosis  MS: moves all visible extremities without noticeable abnormality  PSYCH/NEURO: pleasant and cooperative, no obvious depression or anxiety, speech and thought processing grossly intact   Assessment/Plan 1. Hordeolum externum of right lower eyelid Continue with warm compresses. Topical abx ointment. Expected 2 weeks to recover, but let me know if worsening in meanwhile.      Return if symptoms worsen or fail to improve.   I discussed the assessment and treatment plan with the patient. The patient was provided an opportunity to ask questions and all were answered. The patient agreed with the plan and demonstrated an understanding of the instructions.   The patient was advised to call back or seek an  in-person evaluation if the symptoms worsen or if the condition fails to improve as anticipated.  I provided 15 minutes of non-face-to-face time during this encounter.   Micheline Rough, MD

## 2018-09-06 NOTE — Telephone Encounter (Signed)
noted 

## 2018-09-09 ENCOUNTER — Encounter: Payer: Self-pay | Admitting: Family Medicine

## 2018-09-09 ENCOUNTER — Other Ambulatory Visit: Payer: Self-pay | Admitting: Family Medicine

## 2018-09-09 MED ORDER — ERYTHROMYCIN 5 MG/GM OP OINT: 1.0000 "application " | TOPICAL_OINTMENT | Freq: Three times a day (TID) | OPHTHALMIC | 0 refills | Status: AC

## 2018-09-11 ENCOUNTER — Other Ambulatory Visit: Payer: Self-pay | Admitting: Family Medicine

## 2018-09-11 MED ORDER — AMOXICILLIN-POT CLAVULANATE 875-125 MG PO TABS: 1.0000 | ORAL_TABLET | Freq: Two times a day (BID) | ORAL | 0 refills | Status: DC

## 2018-09-13 MED ORDER — DOXYCYCLINE HYCLATE 100 MG PO TABS: 100.0000 mg | ORAL_TABLET | Freq: Two times a day (BID) | ORAL | 0 refills | Status: AC

## 2018-09-19 ENCOUNTER — Other Ambulatory Visit: Payer: Self-pay | Admitting: Family Medicine

## 2018-09-19 DIAGNOSIS — M25551 Pain in right hip: Secondary | ICD-10-CM

## 2018-09-20 ENCOUNTER — Ambulatory Visit (INDEPENDENT_AMBULATORY_CARE_PROVIDER_SITE_OTHER): Payer: 59 | Admitting: *Deleted

## 2018-09-20 ENCOUNTER — Other Ambulatory Visit: Payer: Self-pay

## 2018-09-20 DIAGNOSIS — J4550 Severe persistent asthma, uncomplicated: Secondary | ICD-10-CM | POA: Diagnosis not present

## 2018-09-20 MED ORDER — EPINEPHRINE 0.3 MG/0.3ML IJ SOAJ: 0.3000 mg | Freq: Once | INTRAMUSCULAR | 2 refills | Status: AC

## 2018-09-20 MED ORDER — BENRALIZUMAB 30 MG/ML ~~LOC~~ SOSY
30.0000 mg | PREFILLED_SYRINGE | Freq: Once | SUBCUTANEOUS | Status: AC
  Administered 2018-09-20: 14:00:00 30 mg via SUBCUTANEOUS

## 2018-09-20 NOTE — Progress Notes (Signed)
Immunotherapy   Patient Details  Name: Alyssa Clay MRN: [id number] Date of Birth: 03-01-1966  09/20/2018  [name] started injections for  [facility] Frequency: Every 4 Weeks X3 doses, then Every 8 Weeks  Epi-Pen: Yes Consent signed and patient instructions given. [name] received Fasenra injection in [facility]. Patient waited 30 minutes and did not experience any issues.    [name] 09/20/2018, 2:15 PM

## 2018-09-23 ENCOUNTER — Other Ambulatory Visit: Payer: Self-pay | Admitting: Allergy

## 2018-09-23 ENCOUNTER — Ambulatory Visit: Payer: Self-pay

## 2018-10-01 ENCOUNTER — Encounter: Payer: Self-pay | Admitting: Family Medicine

## 2018-10-01 ENCOUNTER — Telehealth: Payer: Self-pay

## 2018-10-01 NOTE — Telephone Encounter (Signed)
Per Patients mychart note to PCP, she told me to copy and paste it over to Dr Maudie Mercury.   "I am wondering what your thoughts are about Covid? Am I allowed to go to restaurants if the tables are either outside or spaced apart inside? What about shopping with using precautions? 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 I am also on HBP meds. I am just wondering if I need to stay home or if I can break out [place] here anytime soon? Lol.  Thank you. Hope you doing well!"  Alyssa Clay  [phone]

## 2018-10-02 NOTE — Telephone Encounter (Signed)
Called patient reviewed as written per Dr Maudie Mercury. Patient verbalized understanding.

## 2018-10-02 NOTE — Telephone Encounter (Signed)
Please call back patient.  Here's a good website from the [facility] that she can look at: [email or web address].html  Just because [place] is starting to open up does not mean that coronavirus is not around [place]. We are actually having more cases now than before around our area. There is still no vaccine or cure for this. Patients with underlying medical conditions are at increased risk for complications.   I would recommend to stay away from eating at restaurants. Take out is okay. Regarding shopping - as long as she follows CDC recommendations the risks should be lower than eating at restaurants.

## 2018-10-18 ENCOUNTER — Ambulatory Visit: Payer: Self-pay

## 2018-10-22 ENCOUNTER — Other Ambulatory Visit: Payer: Self-pay | Admitting: Family Medicine

## 2018-10-24 ENCOUNTER — Telehealth: Payer: Self-pay | Admitting: Family Medicine

## 2018-10-24 ENCOUNTER — Ambulatory Visit (INDEPENDENT_AMBULATORY_CARE_PROVIDER_SITE_OTHER): Payer: 59 | Admitting: *Deleted

## 2018-10-24 DIAGNOSIS — J4550 Severe persistent asthma, uncomplicated: Secondary | ICD-10-CM | POA: Diagnosis not present

## 2018-10-24 MED ORDER — LOSARTAN POTASSIUM 50 MG PO TABS: 50.0000 mg | ORAL_TABLET | Freq: Every day | ORAL | 0 refills | Status: DC

## 2018-10-24 MED ORDER — BENRALIZUMAB 30 MG/ML ~~LOC~~ SOSY
30.0000 mg | PREFILLED_SYRINGE | SUBCUTANEOUS | Status: AC
  Administered 2018-10-24 – 2018-11-21 (×2): 30 mg via SUBCUTANEOUS

## 2018-10-24 NOTE — Telephone Encounter (Signed)
REFILL LOSARTAN POTASSIUM 25 MG TAB] PHARMACY [id number] - [place], [place] - [address] AT [place]

## 2018-10-24 NOTE — Telephone Encounter (Signed)
Rx refill sent for Losartan 50mg  as this was previously ordered by PCP-no record of 25mg  on the pts medication list.

## 2018-10-28 ENCOUNTER — Encounter: Payer: Self-pay | Admitting: Family Medicine

## 2018-11-11 ENCOUNTER — Other Ambulatory Visit: Payer: Self-pay | Admitting: Family Medicine

## 2018-11-14 ENCOUNTER — Encounter: Payer: Self-pay | Admitting: Family Medicine

## 2018-11-14 MED ORDER — MELOXICAM 7.5 MG PO TABS: 7.5000 mg | ORAL_TABLET | Freq: Every day | ORAL | 0 refills | Status: DC

## 2018-11-14 NOTE — Addendum Note (Signed)
Addended by: Agnes Lawrence on: 11/14/2018 09:46 AM   Modules accepted: Orders

## 2018-11-18 ENCOUNTER — Other Ambulatory Visit: Payer: Self-pay | Admitting: Allergy

## 2018-11-18 ENCOUNTER — Telehealth (INDEPENDENT_AMBULATORY_CARE_PROVIDER_SITE_OTHER): Payer: 59 | Admitting: Family Medicine

## 2018-11-18 DIAGNOSIS — F321 Major depressive disorder, single episode, moderate: Secondary | ICD-10-CM | POA: Diagnosis not present

## 2018-11-18 DIAGNOSIS — G4700 Insomnia, unspecified: Secondary | ICD-10-CM | POA: Diagnosis not present

## 2018-11-18 DIAGNOSIS — E785 Hyperlipidemia, unspecified: Secondary | ICD-10-CM | POA: Diagnosis not present

## 2018-11-18 DIAGNOSIS — R7301 Impaired fasting glucose: Secondary | ICD-10-CM

## 2018-11-18 MED ORDER — BUPROPION HCL ER (XL) 150 MG PO TB24: 150.0000 mg | ORAL_TABLET | Freq: Every day | ORAL | 1 refills | Status: DC

## 2018-11-18 NOTE — Telephone Encounter (Signed)
Virtual Visit via Telephone Note  I connected with@ on @TODAY @ at  by telephone and verified that I am speaking with the correct person using two identifiers.   I discussed the limitations, risks, security and privacy concerns of performing an evaluation and management service by telephone and the availability of in person appointments. I also discussed with the patient that there may be a patient responsible charge related to this service. The patient expressed understanding and agreed to proceed.  Location patient: home Location provider: work office Participants present for the call: patient, provider Patient did not have a visit in the prior 7 days to address this/these issue(s).   History of Present Illness: Just feeling stress a lot more; more irritable. Tearful. Crying daily through weekend.   With school coming up, just feels stress, overwhelmed. Trying to figure out school decisions. Hard to fall asleep even with trazodone. Overeating.   Wondering about weight loss surgery. Has eliptycal but it broke - this is getting fixed right now. 10 years ago was going to have lap band and ended up going on diet for it and lost 55lbs. So then decided to stick with that diet, but then they were looking at moving, daughter getting married, and then gained this all back.   Thinks she might have done wellbutrin in past; doesn't remember how she did with this. Thinks she might have done well and felt like things were really good on it. Might have been when she backed off that she started to regain weight, etc.   Knees do bother her, limit exercise.    Observations/Objective: Patient sounds cheerful and well on the phone. I do not appreciate any SOB. Speech and thought processing are grossly intact.   Assessment and Plan: 1. Hyperlipidemia, unspecified hyperlipidemia type Will plan to recheck labwork in 2-4 weeks.  2. Morbid obesity ([place]) She has struggled with weight loss. We spent most of  visit discussing different weight loss plans and pros and cons of these.  We discussed Adkins versus keto versus Mediterranean versus whole 30.  She is somewhat limited because she is a picky eater.  She previously did Atkins type diet and did very well with this.  I encouraged her to think through what foods work for her, look through these diet types, and then make a decision.  We did discuss that a diet that is Mediterranean the whole 30 might be helped her from an inflammatory standpoint and possible reduction in joint pain.  We also discussed elevated cholesterol, and how different diets will affect this.  We discussed importance of regular exercise.  She will be doing virtual schooling with her son starting the fall, so we discussed adding in exercise plan that she can participate in with her son.  She will be getting her elliptical back soon, and she looks forward to starting exercising with this since it does not hurt her knees.  3. Depression, major, single episode, moderate (HCC) Mood is not as well-controlled as she would like.  She is on the Cymbalta.  We discussed different options including increasing Cymbalta therapy, or adding medication.  She has previously been on Wellbutrin, we discussed that this is a nice adjunct to help with depression.  Additionally it was weight neutral and may help with some of impulse control with eating.  I have asked her to let me know if she has any worsening of mood or any difficulties is tolerating Wellbutrin.Discussed new medication(s) today with patient. Discussed potential side effects  and patient verbalized understanding.   4. Insomnia, unspecified type Okay to increase trazodone to 1.5 tablets nightly if needed for sleep.   Follow Up Instructions: We will recheck blood work in 2 to 4 weeks.  Follow-up visit pending these results.   I did not refer this patient for an OV in the next 24 hours for this/these issue(s).  I discussed the assessment and  treatment plan with the patient. The patient was provided an opportunity to ask questions and all were answered. The patient agreed with the plan and demonstrated an understanding of the instructions.   The patient was advised to call back or seek an in-person evaluation if the symptoms worsen or if the condition fails to improve as anticipated.  I provided 30 minutes of non-face-to-face time during this encounter.   Micheline Rough, MD

## 2018-11-19 NOTE — Telephone Encounter (Signed)
I left a detailed message at the pts cell number to schedule a lab appt as below.

## 2018-11-21 ENCOUNTER — Other Ambulatory Visit: Payer: Self-pay

## 2018-11-21 ENCOUNTER — Ambulatory Visit (INDEPENDENT_AMBULATORY_CARE_PROVIDER_SITE_OTHER): Payer: 59

## 2018-11-21 DIAGNOSIS — J4550 Severe persistent asthma, uncomplicated: Secondary | ICD-10-CM | POA: Diagnosis not present

## 2018-11-25 ENCOUNTER — Other Ambulatory Visit: Payer: Self-pay

## 2018-11-25 MED ORDER — MONTELUKAST SODIUM 10 MG PO TABS: 10.0000 mg | ORAL_TABLET | Freq: Every day | ORAL | 0 refills | Status: DC

## 2018-11-27 ENCOUNTER — Encounter: Payer: Self-pay | Admitting: Family Medicine

## 2018-11-28 ENCOUNTER — Other Ambulatory Visit: Payer: Self-pay | Admitting: Family Medicine

## 2018-11-28 MED ORDER — TRAZODONE HCL 100 MG PO TABS: 100.0000 mg | ORAL_TABLET | Freq: Every evening | ORAL | 1 refills | Status: DC | PRN

## 2018-12-02 ENCOUNTER — Encounter: Payer: Self-pay | Admitting: Family Medicine

## 2018-12-07 ENCOUNTER — Emergency Department (HOSPITAL_COMMUNITY): Payer: 59

## 2018-12-07 ENCOUNTER — Other Ambulatory Visit: Payer: Self-pay

## 2018-12-07 ENCOUNTER — Encounter (HOSPITAL_COMMUNITY): Payer: Self-pay | Admitting: Emergency Medicine

## 2018-12-07 ENCOUNTER — Inpatient Hospital Stay (HOSPITAL_COMMUNITY)
Admission: EM | Admit: 2018-12-07 | Discharge: 2018-12-14 | DRG: 342 | Disposition: A | Discharge status: Home / Self Care | Payer: 59 | Attending: Surgery | Admitting: Surgery

## 2018-12-07 DIAGNOSIS — F329 Major depressive disorder, single episode, unspecified: Secondary | ICD-10-CM | POA: Diagnosis present

## 2018-12-07 DIAGNOSIS — N179 Acute kidney failure, unspecified: Secondary | ICD-10-CM | POA: Diagnosis present

## 2018-12-07 DIAGNOSIS — T501X5A Adverse effect of loop [high-ceiling] diuretics, initial encounter: Secondary | ICD-10-CM | POA: Diagnosis present

## 2018-12-07 DIAGNOSIS — K352 Acute appendicitis with generalized peritonitis, without abscess: Principal | ICD-10-CM | POA: Diagnosis present

## 2018-12-07 DIAGNOSIS — D72829 Elevated white blood cell count, unspecified: Secondary | ICD-10-CM | POA: Diagnosis not present

## 2018-12-07 DIAGNOSIS — I10 Essential (primary) hypertension: Secondary | ICD-10-CM | POA: Diagnosis present

## 2018-12-07 DIAGNOSIS — R0902 Hypoxemia: Secondary | ICD-10-CM | POA: Diagnosis present

## 2018-12-07 DIAGNOSIS — D649 Anemia, unspecified: Secondary | ICD-10-CM | POA: Diagnosis present

## 2018-12-07 DIAGNOSIS — Z20828 Contact with and (suspected) exposure to other viral communicable diseases: Secondary | ICD-10-CM | POA: Diagnosis present

## 2018-12-07 DIAGNOSIS — K3580 Unspecified acute appendicitis: Secondary | ICD-10-CM | POA: Diagnosis not present

## 2018-12-07 DIAGNOSIS — Z6841 Body Mass Index (BMI) 40.0 and over, adult: Secondary | ICD-10-CM

## 2018-12-07 DIAGNOSIS — R140 Abdominal distension (gaseous): Secondary | ICD-10-CM | POA: Diagnosis not present

## 2018-12-07 DIAGNOSIS — E876 Hypokalemia: Secondary | ICD-10-CM | POA: Diagnosis present

## 2018-12-07 DIAGNOSIS — E662 Morbid (severe) obesity with alveolar hypoventilation: Secondary | ICD-10-CM | POA: Diagnosis present

## 2018-12-07 DIAGNOSIS — R7981 Abnormal blood-gas level: Secondary | ICD-10-CM

## 2018-12-07 DIAGNOSIS — I959 Hypotension, unspecified: Secondary | ICD-10-CM | POA: Diagnosis present

## 2018-12-07 DIAGNOSIS — J45909 Unspecified asthma, uncomplicated: Secondary | ICD-10-CM | POA: Diagnosis present

## 2018-12-07 LAB — COMPREHENSIVE METABOLIC PANEL
ALT: 17 U/L (ref 0–44)
AST: 16 U/L (ref 15–41)
Albumin: 3.8 g/dL (ref 3.5–5.0)
Alkaline Phosphatase: 61 U/L (ref 38–126)
Anion gap: 11 (ref 5–15)
BUN: 11 mg/dL (ref 6–20)
CO2: 24 mmol/L (ref 22–32)
Calcium: 9.1 mg/dL (ref 8.9–10.3)
Chloride: 103 mmol/L (ref 98–111)
Creatinine, Ser: 0.85 mg/dL (ref 0.44–1.00)
GFR calc Af Amer: >60 mL/min (ref >60)
GFR calc non Af Amer: >60 mL/min (ref >60)
Glucose, Bld: 124 mg/dL — ABNORMAL HIGH (ref 70–99)
Potassium: 3.9 mmol/L (ref 3.5–5.1)
Sodium: 138 mmol/L (ref 135–145)
Total Bilirubin: 0.3 mg/dL (ref 0.3–1.2)
Total Protein: 7 g/dL (ref 6.5–8.1)

## 2018-12-07 LAB — PREGNANCY, URINE: Preg Test, Ur: NEGATIVE

## 2018-12-07 LAB — URINALYSIS, ROUTINE W REFLEX MICROSCOPIC
Bilirubin Urine: NEGATIVE
Glucose, UA: NEGATIVE mg/dL
Hgb urine dipstick: NEGATIVE
Ketones, ur: NEGATIVE mg/dL
Leukocytes,Ua: NEGATIVE
Nitrite: NEGATIVE
Protein, ur: 30 mg/dL — AB
Specific Gravity, Urine: 1.026 (ref 1.005–1.030)
pH: 6 (ref 5.0–8.0)

## 2018-12-07 LAB — CBC
HCT: 37.7 % (ref 36.0–46.0)
Hemoglobin: 12 g/dL (ref 12.0–15.0)
MCH: 29.3 pg (ref 26.0–34.0)
MCHC: 31.8 g/dL (ref 30.0–36.0)
MCV: 92.2 fL (ref 80.0–100.0)
Platelets: 351 10*3/uL (ref 150–400)
RBC: 4.09 MIL/uL (ref 3.87–5.11)
RDW: 13.5 % (ref 11.5–15.5)
WBC: 18.1 10*3/uL — ABNORMAL HIGH (ref 4.0–10.5)
nRBC: 0 % (ref 0.0–0.2)

## 2018-12-07 LAB — I-STAT BETA HCG BLOOD, ED (MC, WL, AP ONLY): I-stat hCG, quantitative: 5.2 m[IU]/mL — ABNORMAL HIGH (ref <5)

## 2018-12-07 LAB — SARS CORONAVIRUS 2 BY RT PCR (HOSPITAL ORDER, PERFORMED IN ~~LOC~~ HOSPITAL LAB): SARS Coronavirus 2: NEGATIVE

## 2018-12-07 LAB — LIPASE, BLOOD: Lipase: 22 U/L (ref 11–51)

## 2018-12-07 IMAGING — CT CT ABDOMEN AND PELVIS WITH CONTRAST
2 of 5 series · 16 of 46 positions shown, 18 images · IV contrast (Omni 300)
Comparison: None.

CLINICAL DATA: Abdominal distension and RIGHT lower quadrant pain.

EXAM:
CT ABDOMEN AND PELVIS WITH CONTRAST
TECHNIQUE: Multidetector CT imaging of the abdomen and pelvis was performed
using the standard protocol following bolus administration of
intravenous contrast.
CONTRAST:  100mL OMNIPAQUE IOHEXOL 300 MG/ML  SOLN

[Series 3: a/p w/ 5mm · axial · 0.98mm/px · z∈[-20,+510]mm · 13 of 118 slices shown, 15 images]
[im 6/118  soft-tissue]
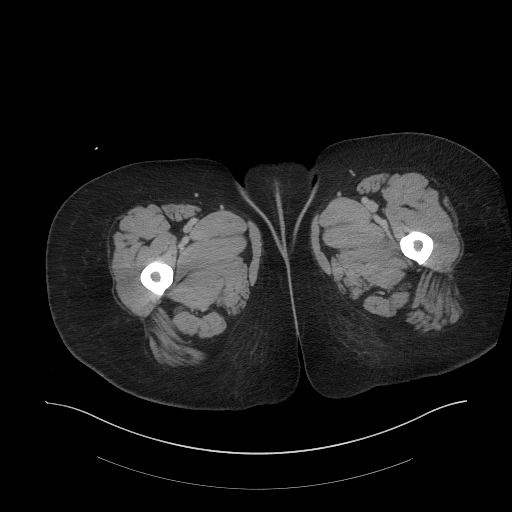
[im 6/118  bone]
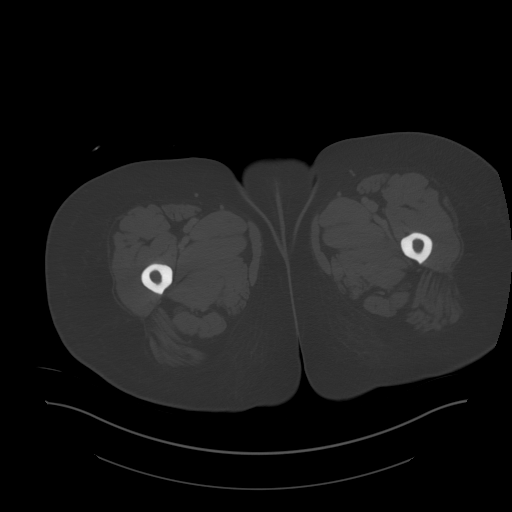
[im 18/118  soft-tissue]
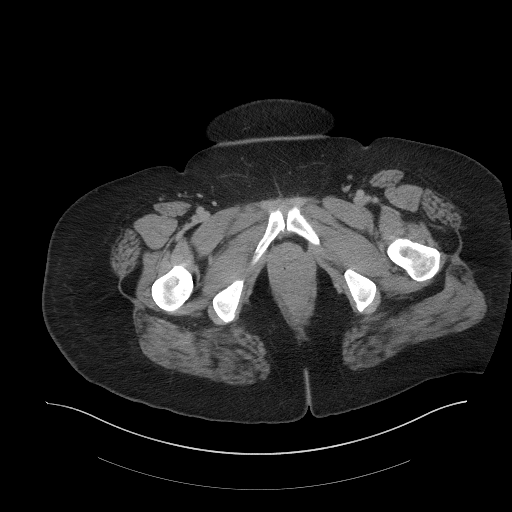
[im 24/118  soft-tissue]
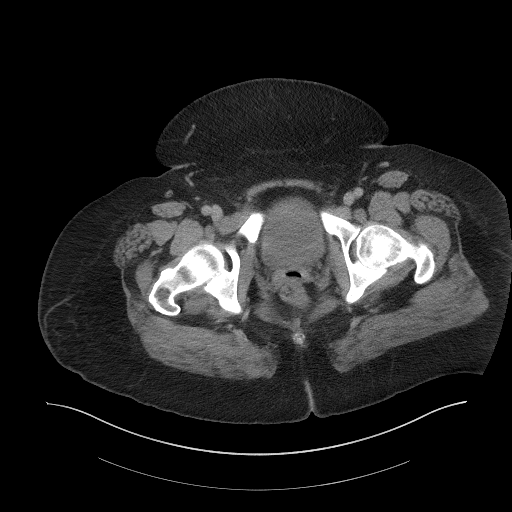
[im 36/118  soft-tissue]
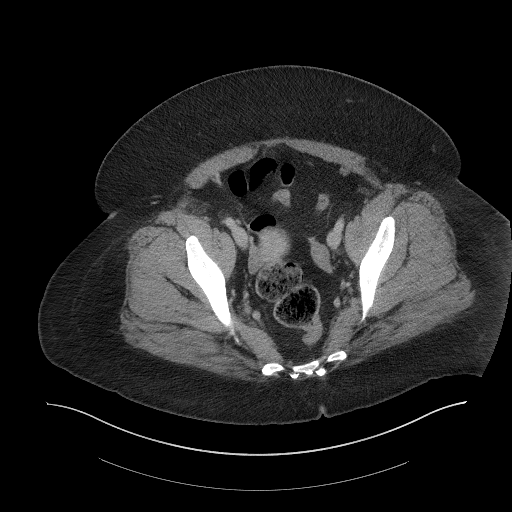
[im 41/118  soft-tissue]
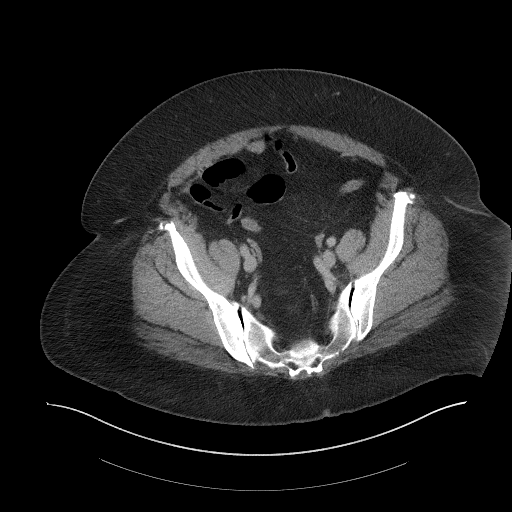
[im 53/118  soft-tissue]
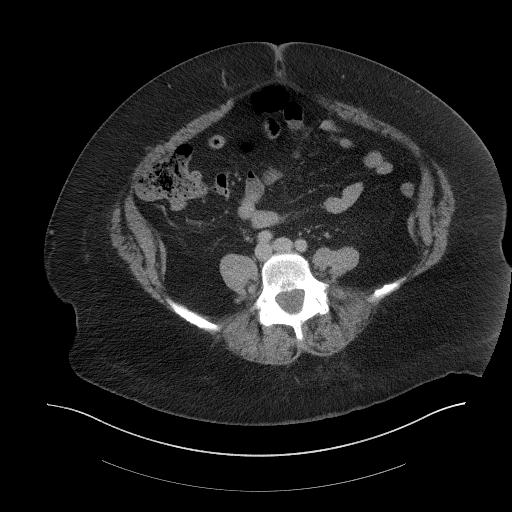
[im 59/118  soft-tissue]
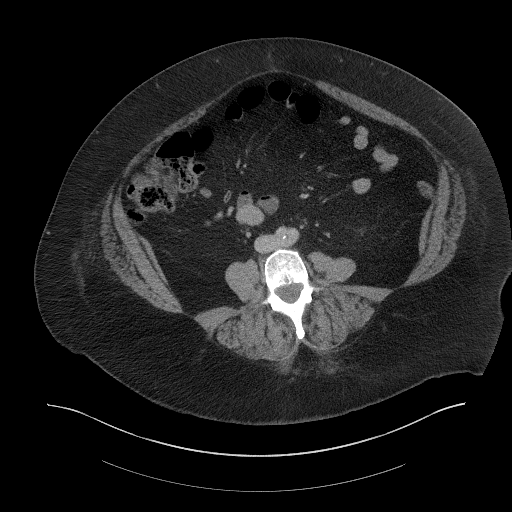
[im 65/118  soft-tissue]
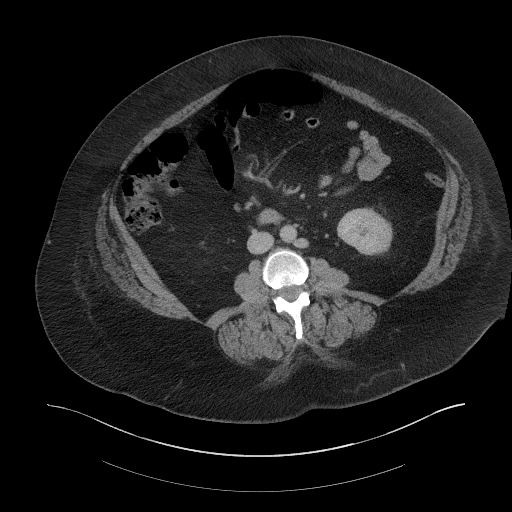
[im 77/118  soft-tissue]
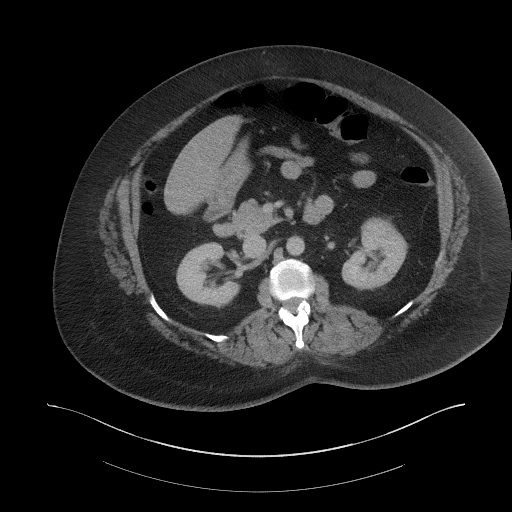
[im 77/118  bone]
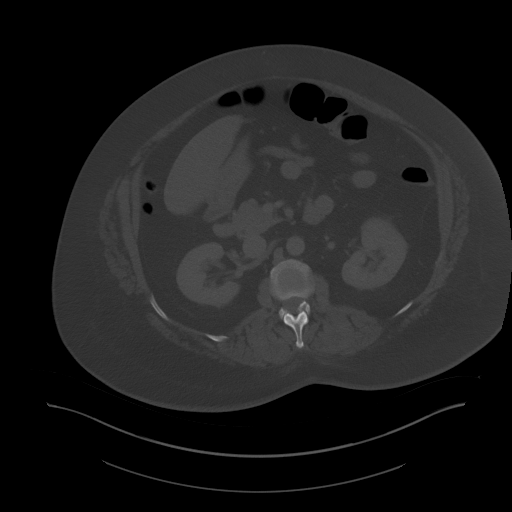
[im 82/118  soft-tissue]
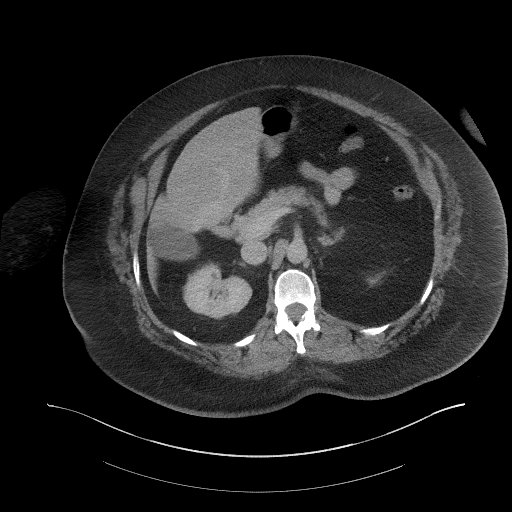
[im 94/118  soft-tissue]
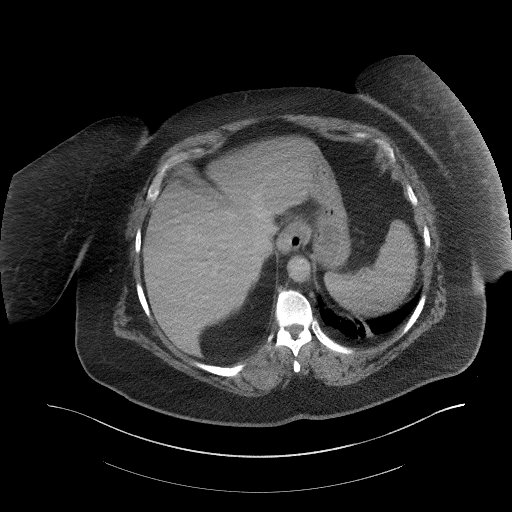
[im 100/118  soft-tissue]
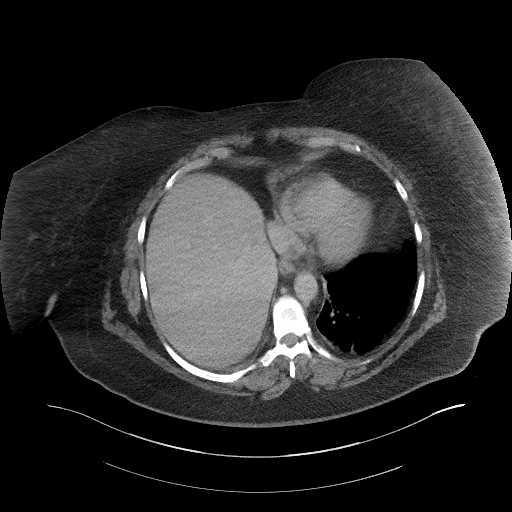
[im 112/118  soft-tissue]
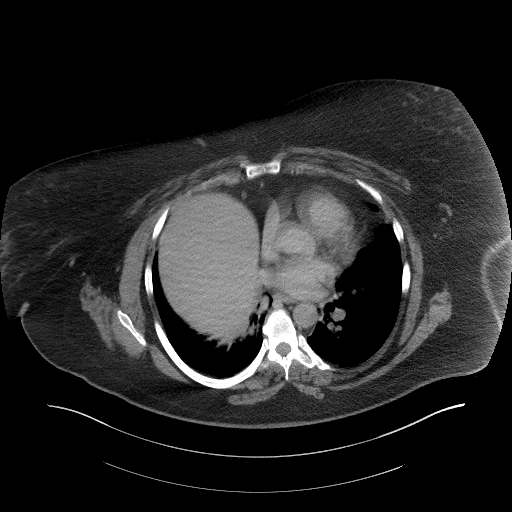

[Series 6: a/p w/ cor · coronal · 1.15mm/px · 3 of 227 slices shown]
[im 76/227  soft-tissue]
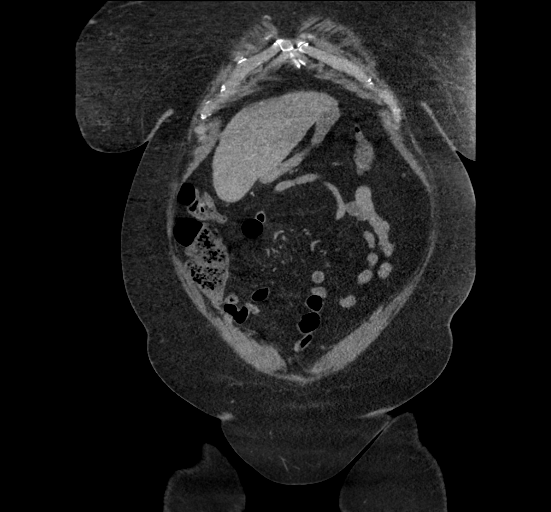
[im 101/227  soft-tissue]
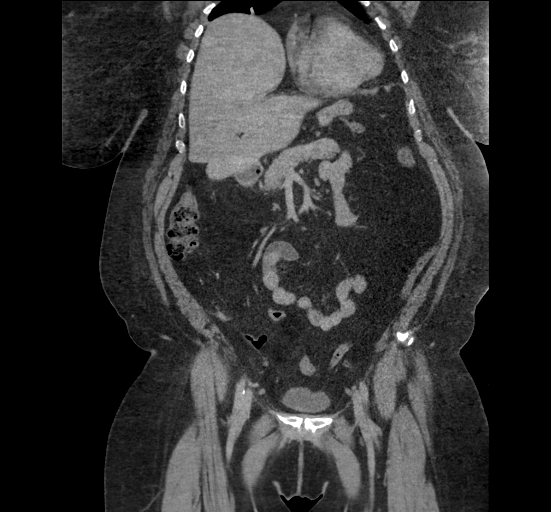
[im 126/227  soft-tissue]
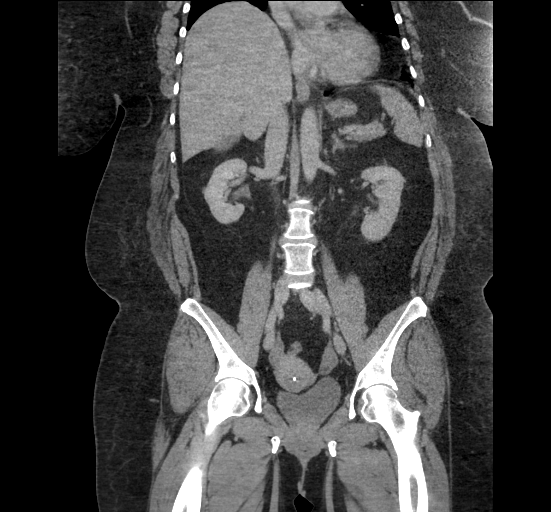

[16 of 46 positions shown; findings below may reference images not displayed]

FINDINGS: Lower chest: Mild bibasilar atelectasis. Elevated RIGHT
hemidiaphragm, of uncertain chronicity.

Hepatobiliary: No focal liver abnormality is seen. No gallstones,
gallbladder wall thickening, or biliary dilatation.

Pancreas: Unremarkable. No pancreatic ductal dilatation or
surrounding inflammatory changes.

Spleen: Normal in size without focal abnormality.

Adrenals/Urinary Tract: Adrenal glands appear normal. Kidneys are
unremarkable without suspicious mass, stone or hydronephrosis. No
ureteral or bladder calculi identified. Bladder is unremarkable,
partially decompressed.

Stomach/Bowel: No dilated large or small bowel loops. No evidence of
bowel wall inflammation.

Appendix: Location: Below the cecum in the RIGHT lower quadrant

Diameter: 16 mm

Appendicolith: 7 mm at the base of the appendix.

Mucosal hyper-enhancement: Not convincingly

Extraluminal gas: Not Seen

Periappendiceal collection: Not Seen

Vascular/Lymphatic: Aortic atherosclerosis. No enlarged lymph nodes
seen.

Reproductive: Intrauterine device appears appropriately positioned.
No adnexal mass or free fluid.

Other: No abscess collection. No free intraperitoneal air.

Musculoskeletal: No acute or suspicious osseous finding. Mild
degenerative spondylosis of the lumbar spine.
IMPRESSION: Acute appendicitis, as detailed above. Appendix is distended to 16
mm diameter and the walls of the appendix appear thickened. There is
associated periappendiceal inflammation. No associated abscess
collection or free intraperitoneal air.

Aortic Atherosclerosis ([24]-[24]).

## 2018-12-07 MED ORDER — HYDRALAZINE HCL 20 MG/ML IJ SOLN: 10.0000 mg | INTRAMUSCULAR | Status: DC | PRN

## 2018-12-07 MED ORDER — FENTANYL CITRATE (PF) 100 MCG/2ML IJ SOLN
50.0000 ug | Freq: Once | INTRAMUSCULAR | Status: AC
  Administered 2018-12-07: 50 ug via INTRAVENOUS
  Filled 2018-12-07: qty 2

## 2018-12-07 MED ORDER — ONDANSETRON 4 MG PO TBDP: 4.0000 mg | ORAL_TABLET | Freq: Four times a day (QID) | ORAL | Status: DC | PRN

## 2018-12-07 MED ORDER — DULOXETINE HCL 60 MG PO CPEP
60.0000 mg | ORAL_CAPSULE | Freq: Every day | ORAL | Status: DC
  Administered 2018-12-08 – 2018-12-14 (×7): 60 mg via ORAL
  Filled 2018-12-07 (×7): qty 1

## 2018-12-07 MED ORDER — PANTOPRAZOLE SODIUM 40 MG PO TBEC
40.0000 mg | DELAYED_RELEASE_TABLET | Freq: Every day | ORAL | Status: DC
  Administered 2018-12-08 – 2018-12-14 (×7): 40 mg via ORAL
  Filled 2018-12-07 (×7): qty 1

## 2018-12-07 MED ORDER — BUPROPION HCL ER (XL) 150 MG PO TB24: 150.0000 mg | ORAL_TABLET | Freq: Every day | ORAL | Status: DC

## 2018-12-07 MED ORDER — LOSARTAN POTASSIUM 50 MG PO TABS
50.0000 mg | ORAL_TABLET | Freq: Every day | ORAL | Status: DC
  Administered 2018-12-08 – 2018-12-09 (×2): 50 mg via ORAL
  Filled 2018-12-07 (×2): qty 1

## 2018-12-07 MED ORDER — SODIUM CHLORIDE 0.9% FLUSH
3.0000 mL | Freq: Once | INTRAVENOUS | Status: AC
  Administered 2018-12-07: 3 mL via INTRAVENOUS

## 2018-12-07 MED ORDER — SUMATRIPTAN SUCCINATE 50 MG PO TABS: 50.0000 mg | ORAL_TABLET | Freq: Once | ORAL | Status: DC

## 2018-12-07 MED ORDER — SODIUM CHLORIDE 0.9 % IV BOLUS
1000.0000 mL | Freq: Once | INTRAVENOUS | Status: AC
  Administered 2018-12-07: 1000 mL via INTRAVENOUS

## 2018-12-07 MED ORDER — IOHEXOL 300 MG/ML  SOLN
100.0000 mL | Freq: Once | INTRAMUSCULAR | Status: AC | PRN
  Administered 2018-12-07: 100 mL via INTRAVENOUS

## 2018-12-07 MED ORDER — TRAZODONE HCL 100 MG PO TABS
100.0000 mg | ORAL_TABLET | Freq: Every evening | ORAL | Status: DC | PRN
  Administered 2018-12-08 – 2018-12-11 (×4): 200 mg via ORAL
  Administered 2018-12-12: 100 mg via ORAL
  Administered 2018-12-13: 200 mg via ORAL
  Filled 2018-12-07 (×2): qty 2
  Filled 2018-12-07: qty 1
  Filled 2018-12-07: qty 2
  Filled 2018-12-07: qty 1
  Filled 2018-12-07 (×2): qty 2

## 2018-12-07 MED ORDER — HYDROMORPHONE HCL 1 MG/ML IJ SOLN
1.0000 mg | INTRAMUSCULAR | Status: DC | PRN
  Administered 2018-12-07 – 2018-12-08 (×6): 1 mg via INTRAVENOUS
  Filled 2018-12-07 (×5): qty 1

## 2018-12-07 MED ORDER — ENOXAPARIN SODIUM 40 MG/0.4ML ~~LOC~~ SOLN
40.0000 mg | SUBCUTANEOUS | Status: DC
  Administered 2018-12-09: 40 mg via SUBCUTANEOUS
  Filled 2018-12-07: qty 0.4

## 2018-12-07 MED ORDER — SUMATRIPTAN SUCCINATE 50 MG PO TABS
50.0000 mg | ORAL_TABLET | ORAL | Status: DC | PRN
  Filled 2018-12-07: qty 1

## 2018-12-07 MED ORDER — PIPERACILLIN-TAZOBACTAM 3.375 G IVPB
3.3750 g | Freq: Three times a day (TID) | INTRAVENOUS | Status: DC
  Administered 2018-12-08: 3.375 g via INTRAVENOUS
  Filled 2018-12-07: qty 50

## 2018-12-07 MED ORDER — HYDROMORPHONE HCL 1 MG/ML IJ SOLN: 1.0000 mg | Freq: Once | INTRAMUSCULAR | Status: DC

## 2018-12-07 MED ORDER — PIPERACILLIN-TAZOBACTAM 3.375 G IVPB 30 MIN
3.3750 g | Freq: Once | INTRAVENOUS | Status: AC
  Administered 2018-12-07: 3.375 g via INTRAVENOUS
  Filled 2018-12-07: qty 50

## 2018-12-07 MED ORDER — HYDROMORPHONE HCL 1 MG/ML IJ SOLN
1.0000 mg | INTRAMUSCULAR | Status: DC | PRN
  Administered 2018-12-07: 1 mg via INTRAVENOUS
  Filled 2018-12-07: qty 1

## 2018-12-07 MED ORDER — MORPHINE SULFATE (PF) 4 MG/ML IV SOLN
4.0000 mg | Freq: Once | INTRAVENOUS | Status: AC
  Administered 2018-12-07: 4 mg via INTRAVENOUS
  Filled 2018-12-07: qty 1

## 2018-12-07 MED ORDER — MOMETASONE FURO-FORMOTEROL FUM 200-5 MCG/ACT IN AERO
2.0000 | INHALATION_SPRAY | Freq: Two times a day (BID) | RESPIRATORY_TRACT | Status: DC
  Filled 2018-12-07: qty 8.8

## 2018-12-07 MED ORDER — HYDROMORPHONE HCL 1 MG/ML IJ SOLN
1.0000 mg | Freq: Once | INTRAMUSCULAR | Status: AC
  Administered 2018-12-07: 1 mg via INTRAVENOUS
  Filled 2018-12-07: qty 1

## 2018-12-07 MED ORDER — ONDANSETRON HCL 4 MG/2ML IJ SOLN
4.0000 mg | Freq: Once | INTRAMUSCULAR | Status: AC
  Administered 2018-12-07: 4 mg via INTRAVENOUS
  Filled 2018-12-07: qty 2

## 2018-12-07 MED ORDER — ONDANSETRON HCL 4 MG/2ML IJ SOLN: 4.0000 mg | Freq: Four times a day (QID) | INTRAMUSCULAR | Status: DC | PRN

## 2018-12-07 MED ORDER — LORAZEPAM 2 MG/ML IJ SOLN
1.0000 mg | INTRAMUSCULAR | Status: DC | PRN
  Administered 2018-12-08: 1 mg via INTRAVENOUS
  Filled 2018-12-07: qty 1

## 2018-12-07 NOTE — ED Triage Notes (Signed)
Pt. Stated, I started nausea/vomiting last night then the pain started around 300am

## 2018-12-07 NOTE — ED Provider Notes (Signed)
[facility] EMERGENCY DEPARTMENT Provider Note   CSN: [id number] Arrival date & time: 12/07/18  1300     History   Chief Complaint Chief Complaint  Patient presents with  . Abdominal Pain  . Emesis  . Nausea    HPI Alyssa Clay is a 53 y.o. female history of hypertension, depression, migraines here presenting with right lower quadrant and right flank pain.  She states that the pain acutely happened around 3 AM.  Associated with several episodes of vomiting.  States that the pain is sharp radiates to the right flank area.  Denies any blood in his urine.  Denies any previous abdominal surgeries.  States that this is worse than her delivery for her child.      The history is provided by the patient.    Past Medical History:  Diagnosis Date  . Asthma   . Depression   . Hypertension   . Migraine     Patient Active Problem List   Diagnosis Date Noted  . Primary osteoarthritis of both hands 07/10/2018  . Primary osteoarthritis of both knees 07/10/2018  . Not well controlled severe persistent asthma 04/22/2018  . Hypertension 03/27/2018  . 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 Moderate persistent asthma 01/07/2018  . Chronic rhinitis 01/07/2018  . Gastroesophageal reflux disease 01/07/2018    Past Surgical History:  Procedure Laterality Date  . CESAREAN SECTION  1994  . DILATION AND CURETTAGE OF UTERUS     multiple due to miscarriages  . KNEE ARTHROSCOPY Left 2013  . TENNIS ELBOW RELEASE/NIRSCHEL PROCEDURE  2009  . WISDOM TOOTH EXTRACTION       OB History   No obstetric history on file.      Home Medications    Prior to Admission medications   Medication Sig Start Date End Date Taking? Authorizing Provider  albuterol (PROVENTIL HFA;VENTOLIN HFA) 108 (90 Base) MCG/ACT inhaler Inhale 2 puffs into the lungs every 6 (six) hours as needed for wheezing or shortness of breath. 06/03/18   Garnet Sierras, DO   albuterol (PROVENTIL) (2.5 MG/3ML) 0.083% nebulizer solution Take 2.5 mg by nebulization every 6 (six) hours as needed for wheezing or shortness of breath.    [provider]  budesonide-formoterol (SYMBICORT) 160-4.5 MCG/ACT inhaler Inhale 2 puffs into the lungs 2 (two) times daily. 01/07/18   Bobbitt, Sedalia Muta, MD  buPROPion (WELLBUTRIN XL) 150 MG 24 hr tablet Take 1 tablet (150 mg total) by mouth daily. 11/18/18   Caren Macadam, MD  calcium carbonate (OSCAL) 1500 (600 Ca) MG TABS tablet Take 600 mg of elemental calcium by mouth 2 (two) times daily with a meal.    [provider]  clotrimazole-betamethasone (LOTRISONE) cream Apply 1 application topically 2 (two) times daily. 07/24/18   Koberlein, Steele Berg, MD  DULoxetine (CYMBALTA) 60 MG capsule TAKE 1 CAPSULE BY MOUTH EVERY DAY 11/15/18   Koberlein, Steele Berg, MD  Erenumab-aooe (AIMOVIG) 70 MG/ML SOAJ Inject into the skin.    [provider]  FASENRA 30 MG/ML SOSY SECOND SHIP: INJECT ONE SYRINGE UNDER THE SKIN AT WEEK 4 AND 8, THEN EVERY 8 WEEKS THEREAFTER. 09/24/18   Garnet Sierras, DO  FASENRA 30 MG/ML SOSY FIRST SHIP: INJECT ONE SYRINGE UNDER THE SKIN AT WEEK 0, 4, AND 8, THEN EVERY 8 WEEKS THEREAFTER. 09/24/18   Garnet Sierras, DO  fluconazole (DIFLUCAN) 150 MG tablet Take 1 pill now.  Take second pill in 3  days if symptoms continue. 03/01/18   Koberlein, Steele Berg, MD  fluticasone (FLONASE) 50 MCG/ACT nasal spray Place 1 spray into both nostrils 2 (two) times daily. 02/11/18   Garnet Sierras, DO  gabapentin (NEURONTIN) 300 MG capsule  01/03/18   [provider]  ketoconazole (NIZORAL) 2 % cream APPLY TO AFFECTED AREA TWICE A DAY UNTIL 1 WK POST CLEAR 01/08/18   [provider]  losartan (COZAAR) 50 MG tablet Take 1 tablet (50 mg total) by mouth daily. 10/24/18   Caren Macadam, MD  meloxicam (MOBIC) 7.5 MG tablet Take 1 tablet (7.5 mg total) by mouth daily. 11/14/18   Caren Macadam, MD  methocarbamol  (ROBAXIN) 500 MG tablet Take 1 tablet (500 mg total) by mouth 2 (two) times daily as needed for muscle spasms. 07/29/18   Billie Ruddy, MD  montelukast (SINGULAIR) 10 MG tablet Take 1 tablet (10 mg total) by mouth daily. 11/25/18   Garnet Sierras, DO  Multiple Vitamin (MULTIVITAMIN WITH MINERALS) TABS tablet Take 1 tablet by mouth daily.    [provider]  pantoprazole (PROTONIX) 40 MG tablet Take 1 tablet (40 mg total) by mouth 2 (two) times daily before a meal. Patient taking differently: Take 40 mg by mouth daily.  03/27/18   Caren Macadam, MD  Probiotic Product (PROBIOTIC PO) Take 1 capsule by mouth daily.    [provider]  Riboflavin-Magnesium-Feverfew (MIGRELIEF) 200-180-50 MG TABS Take by mouth.    [provider]  rizatriptan (MAXALT) 10 MG tablet Take 10 mg by mouth as needed for migraine. May repeat in 2 hours if needed    [provider]  Sharps Container (GUARDIAN SHARPS COLLECTOR) MISC Use with injection. 03/15/18   [provider]  Tiotropium Bromide Monohydrate (SPIRIVA RESPIMAT) 1.25 MCG/ACT AERS Inhale 2 puffs into the lungs daily. 01/07/18   Bobbitt, Sedalia Muta, MD  tiZANidine (ZANAFLEX) 2 MG tablet TAKE 1-2 TABS (2-4MG ) BY MOUTH EVERY 6-8 HOURS AS NEEDED (USE UP TO 3 TIMES DAILY) 02/06/18   [provider]  traZODone (DESYREL) 100 MG tablet Take 1-2 tablets (100-200 mg total) by mouth at bedtime as needed for sleep. 11/28/18   Caren Macadam, MD  triamcinolone cream (KENALOG) 0.1 % Apply 1 application topically daily as needed.    [provider]  valACYclovir (VALTREX) 1000 MG tablet Take 1 tablet (1,000 mg total) by mouth 2 (two) times daily. 05/17/18   Caren Macadam, MD    Family History Family History  Problem Relation Age of Onset  . Cancer Mother 47       lymphoma  . Depression Mother   . Hypertension Father   . Heart attack Father 68  . Hypertension Brother   . High blood pressure Brother    . High Cholesterol Brother   . Healthy Daughter     Social History Social History   Tobacco Use  . Smoking status: Never Smoker  . Smokeless tobacco: Never Used  Substance Use Topics  . Alcohol use: Yes    Frequency: Never    Comment: occ  . Drug use: Never     Allergies   Patient has no known allergies.   Review of Systems Review of Systems  Gastrointestinal: Positive for abdominal pain and vomiting.  All other systems reviewed and are negative.    Physical Exam Updated Vital Signs BP (!) 151/67 (BP Location: Left Wrist)   Pulse 81   Temp 98.7 F (37.1 C) (  Oral)   Resp (!) 22   SpO2 96%   Physical Exam Vitals signs and nursing note reviewed.  Constitutional:      Comments: Uncomfortable   HENT:     Head: Normocephalic.  Eyes:     Extraocular Movements: Extraocular movements intact.  Cardiovascular:     Rate and Rhythm: Normal rate and regular rhythm.     Heart sounds: Normal heart sounds.  Pulmonary:     Breath sounds: Normal breath sounds.  Abdominal:     General: Abdomen is flat. Bowel sounds are normal.     Comments: + RLQ tenderness. Mild R mid tenderness but no RUQ tenderness, mild rebound. Mild R CVAT   Skin:    General: Skin is warm.     Capillary Refill: Capillary refill takes less than 2 seconds.  Neurological:     General: No focal deficit present.     Mental Status: She is alert and oriented to person, place, and time.  Psychiatric:        Mood and Affect: Mood normal.        Behavior: Behavior normal.      ED Treatments / Results  Labs (all labs ordered are listed, but only abnormal results are displayed) Labs Reviewed  CBC - Abnormal; Notable for the following components:      Result Value   WBC 18.1 (*)    All other components within normal limits  I-STAT BETA HCG BLOOD, ED ([facility], [facility], [facility] ONLY) - Abnormal; Notable for the following components:   I-stat hCG, quantitative 5.2 (*)    All other components within normal limits   LIPASE, BLOOD  COMPREHENSIVE METABOLIC PANEL  URINALYSIS, ROUTINE W REFLEX MICROSCOPIC    EKG None  Radiology No results found.  Procedures Procedures (including critical care time)  Angiocath insertion Performed by: Wandra Arthurs  Consent: Verbal consent obtained. Risks and benefits: risks, benefits and alternatives were discussed Time out: Immediately prior to procedure a "time out" was called to verify the correct patient, procedure, equipment, support staff and site/side marked as required.  Preparation: Patient was prepped and draped in the usual sterile fashion.  Vein Location: R antecube  Ultrasound Guided  Gauge: 20 long   Normal blood return and flush without difficulty Patient tolerance: Patient tolerated the procedure well with no immediate complications.     Medications Ordered in ED Medications  sodium chloride flush (NS) 0.9 % injection 3 mL (3 mLs Intravenous Given 12/07/18 1711)  sodium chloride 0.9 % bolus 1,000 mL (1,000 mLs Intravenous New Bag/Given 12/07/18 1714)  ondansetron (ZOFRAN) injection 4 mg (4 mg Intravenous Given 12/07/18 1707)  morphine 4 MG/ML injection 4 mg (4 mg Intravenous Given 12/07/18 1707)     Initial Impression / Assessment and Plan / ED Course  I have reviewed the triage vital signs and the nursing notes.  Pertinent labs & imaging results that were available during my care of the patient were reviewed by me and considered in my medical decision making (see chart for details).       Madisen Shabnam Ladd is a 53 y.o. female here with RLQ pain. Likely renal colic vs appendicitis vs pyelo. Will get labs, UA, CT ab/pel.  8 pm WBC 18. CT showed acute appendicitis. Dr. Brantley Stage to admit. COVID pending.   Final Clinical Impressions(s) / ED Diagnoses   Final diagnoses:  None    ED Discharge Orders    None       Drenda Freeze,  MD 12/07/18 2034

## 2018-12-07 NOTE — H&P (Signed)
Alyssa Clay is an 53 y.o. female.   Chief Complaint: Abdominal pain HPI: Patient presents emergency room with a history of right lower quadrant abdominal pain.  She said pain on and off she says of the last 2 weeks in her lower abdomen.  Over the last 24 hours she has had more severe right lower quadrant abdominal pain and had nausea and vomiting last night.  CT scan shows acute appendicitis without perforation.  She still complains of abdominal pain.  Location right lower quadrant without radiation.  Made worse with movement.  Past Medical History:  Diagnosis Date  . Asthma   . Depression   . Hypertension   . Migraine     Past Surgical History:  Procedure Laterality Date  . CESAREAN SECTION  1994  . DILATION AND CURETTAGE OF UTERUS     multiple due to miscarriages  . KNEE ARTHROSCOPY Left 2013  . TENNIS ELBOW RELEASE/NIRSCHEL PROCEDURE  2009  . WISDOM TOOTH EXTRACTION      Family History  Problem Relation Age of Onset  . Cancer Mother 36       lymphoma  . Depression Mother   . Hypertension Father   . Heart attack Father 65  . Hypertension Brother   . High blood pressure Brother   . High Cholesterol Brother   . Healthy Daughter    Social History:  reports that she has never smoked. She has never used smokeless tobacco. She reports current alcohol use. She reports that she does not use drugs.  Allergies: No Known Allergies  (Not in a hospital admission)   Results for orders placed or performed during the hospital encounter of 12/07/18 (from the past 48 hour(s))  Lipase, blood     Status: None   Collection Time: 12/07/18  5:01 PM  Result Value Ref Range   Lipase 22 11 - 51 U/L    Comment: Performed at [facility], [place] [address], [place], [place] [address]  Comprehensive metabolic panel     Status: Abnormal   Collection Time: 12/07/18  5:01 PM  Result Value Ref Range   Sodium 138 135 - 145 mmol/L   Potassium 3.9 3.5 - 5.1 mmol/L   Chloride 103 98 -  111 mmol/L   CO2 24 22 - 32 mmol/L   Glucose, Bld 124 (H) 70 - 99 mg/dL   BUN 11 6 - 20 mg/dL   Creatinine, Ser 0.85 0.44 - 1.00 mg/dL   Calcium 9.1 8.9 - 10.3 mg/dL   Total Protein 7.0 6.5 - 8.1 g/dL   Albumin 3.8 3.5 - 5.0 g/dL   AST 16 15 - 41 U/L   ALT 17 0 - 44 U/L   Alkaline Phosphatase 61 38 - 126 U/L   Total Bilirubin 0.3 0.3 - 1.2 mg/dL   GFR calc non Af Amer >60 >60 mL/min   GFR calc Af Amer >60 >60 mL/min   Anion gap 11 5 - 15    Comment: Performed at [facility], [place] [address] [place], [place] [address]  CBC     Status: Abnormal   Collection Time: 12/07/18  5:01 PM  Result Value Ref Range   WBC 18.1 (H) 4.0 - 10.5 K/uL   RBC 4.09 3.87 - 5.11 MIL/uL   Hemoglobin 12.0 12.0 - 15.0 g/dL   HCT 37.7 36.0 - 46.0 %   MCV 92.2 80.0 - 100.0 fL   MCH 29.3 26.0 - 34.0 pg   MCHC 31.8  30.0 - 36.0 g/dL   RDW 13.5 11.5 - 15.5 %   Platelets 351 150 - 400 K/uL   nRBC 0.0 0.0 - 0.2 %    Comment: Performed at [facility], [place] [address], [place], [place] [address]  I-Stat beta hCG blood, ED     Status: Abnormal   Collection Time: 12/07/18  5:05 PM  Result Value Ref Range   I-stat hCG, quantitative 5.2 (H) <5 mIU/mL   Comment 3            Comment:   GEST. AGE      CONC.  (mIU/mL)   <=1 WEEK        5 - 50     2 WEEKS       50 - 500     3 WEEKS       100 - 10,000     4 WEEKS     1,000 - 30,000        FEMALE AND NON-PREGNANT FEMALE:     LESS THAN 5 mIU/mL   Pregnancy, urine     Status: None   Collection Time: 12/07/18  5:37 PM  Result Value Ref Range   Preg Test, Ur NEGATIVE NEGATIVE    Comment:        THE SENSITIVITY OF THIS METHODOLOGY IS >20 mIU/mL. Performed at [facility], [place] [address], [place], [place] [address]   Urinalysis, Routine w reflex microscopic     Status: Abnormal   Collection Time: 12/07/18  6:27 PM  Result Value Ref Range   Color, Urine YELLOW YELLOW   APPearance HAZY (A) CLEAR   Specific Gravity, Urine 1.026 1.005 - 1.030   pH  6.0 5.0 - 8.0   Glucose, UA NEGATIVE NEGATIVE mg/dL   Hgb urine dipstick NEGATIVE NEGATIVE   Bilirubin Urine NEGATIVE NEGATIVE   Ketones, ur NEGATIVE NEGATIVE mg/dL   Protein, ur 30 (A) NEGATIVE mg/dL   Nitrite NEGATIVE NEGATIVE   Leukocytes,Ua NEGATIVE NEGATIVE   RBC / HPF 0-5 0 - 5 RBC/hpf   WBC, UA 0-5 0 - 5 WBC/hpf   Bacteria, UA RARE (A) NONE SEEN   Squamous Epithelial / LPF 0-5 0 - 5   Mucus PRESENT     Comment: Performed at [facility], [place] [address] [place], [place] [address]  SARS Coronavirus 2 [facility] order, Performed in [facility] hospital lab) Nasopharyngeal Nasopharyngeal Swab     Status: None   Collection Time: 12/07/18  8:56 PM   Specimen: Nasopharyngeal Swab  Result Value Ref Range   SARS Coronavirus 2 NEGATIVE NEGATIVE    Comment: (NOTE) If result is NEGATIVE SARS-CoV-2 target nucleic acids are NOT DETECTED. The SARS-CoV-2 RNA is generally detectable in upper and lower  respiratory specimens during the acute phase of infection. The lowest  concentration of SARS-CoV-2 viral copies this assay can detect is 250  copies / mL. A negative result does not preclude SARS-CoV-2 infection  and should not be used as the sole basis for treatment or other  patient management decisions.  A negative result may occur with  improper specimen collection / handling, submission of specimen other  than nasopharyngeal swab, presence of viral mutation(s) within the  areas targeted by this assay, and inadequate number of viral copies  (<250 copies / mL). A negative result must be combined with clinical  observations, patient history, and epidemiological information. If result is POSITIVE SARS-CoV-2 target nucleic acids are DETECTED. The SARS-CoV-2  RNA is generally detectable in upper and lower  respiratory specimens dur ing the acute phase of infection.  Positive  results are indicative of active infection with SARS-CoV-2.  Clinical  correlation with patient history and  other diagnostic information is  necessary to determine patient infection status.  Positive results do  not rule out bacterial infection or co-infection with other viruses. If result is PRESUMPTIVE POSTIVE SARS-CoV-2 nucleic acids MAY BE PRESENT.   A presumptive positive result was obtained on the submitted specimen  and confirmed on repeat testing.  While 2019 novel coronavirus  (SARS-CoV-2) nucleic acids may be present in the submitted sample  additional confirmatory testing may be necessary for epidemiological  and / or clinical management purposes  to differentiate between  SARS-CoV-2 and other Sarbecovirus currently known to infect humans.  If clinically indicated additional testing with an alternate test  methodology [phone]) is advised. The SARS-CoV-2 RNA is generally  detectable in upper and lower respiratory sp ecimens during the acute  phase of infection. The expected result is Negative. Fact Sheet for Patients:  StrictlyIdeas.no Fact Sheet for Healthcare Providers: BankingDealers.co.za This test is not yet approved or cleared by the [place] FDA and has been authorized for detection and/or diagnosis of SARS-CoV-2 by FDA under an Emergency Use Authorization (EUA).  This EUA will remain in effect (meaning this test can be used) for the duration of the COVID-19 declaration under Section 564(b)(1) of the Act, 21 U.S.C. section 360bbb-3(b)(1), unless the authorization is terminated or revoked sooner. Performed at [facility], [place] [address] [place], [place] [address]    Ct Abdomen Pelvis W Contrast  Result Date: 12/07/2018 CLINICAL DATA:  Abdominal distension and RIGHT lower quadrant pain. EXAM: CT ABDOMEN AND PELVIS WITH CONTRAST TECHNIQUE: Multidetector CT imaging of the abdomen and pelvis was performed using the standard protocol following bolus administration of intravenous contrast. CONTRAST:  112mL OMNIPAQUE  IOHEXOL 300 MG/ML  SOLN COMPARISON:  None. FINDINGS: Lower chest: Mild bibasilar atelectasis. Elevated RIGHT hemidiaphragm, of uncertain chronicity. Hepatobiliary: No focal liver abnormality is seen. No gallstones, gallbladder wall thickening, or biliary dilatation. Pancreas: Unremarkable. No pancreatic ductal dilatation or surrounding inflammatory changes. Spleen: Normal in size without focal abnormality. Adrenals/Urinary Tract: Adrenal glands appear normal. Kidneys are unremarkable without suspicious mass, stone or hydronephrosis. No ureteral or bladder calculi identified. Bladder is unremarkable, partially decompressed. Stomach/Bowel: No dilated large or small bowel loops. No evidence of bowel wall inflammation. Appendix: Location: Below the cecum in the RIGHT lower quadrant Diameter: 16 mm Appendicolith: 7 mm at the base of the appendix. Mucosal hyper-enhancement: Not convincingly Extraluminal gas: Not Seen Periappendiceal collection: Not Seen Vascular/Lymphatic: Aortic atherosclerosis. No enlarged lymph nodes seen. Reproductive: Intrauterine device appears appropriately positioned. No adnexal mass or free fluid. Other: No abscess collection. No free intraperitoneal air. Musculoskeletal: No acute or suspicious osseous finding. Mild degenerative spondylosis of the lumbar spine. IMPRESSION: Acute appendicitis, as detailed above. Appendix is distended to 16 mm diameter and the walls of the appendix appear thickened. There is associated periappendiceal inflammation. No associated abscess collection or free intraperitoneal air. Aortic Atherosclerosis (ICD10-I70.0). Electronically Signed   By: Franki Cabot M.D.   On: 12/07/2018 19:36    Review of Systems  Gastrointestinal: Positive for abdominal pain, nausea and vomiting.  All other systems reviewed and are negative.   Blood pressure 138/71, pulse 87, temperature 98.7 F (37.1 C), temperature source Oral, resp. rate (!) 22, SpO2 92 %. Physical Exam   Constitutional: She is  oriented to person, place, and time. She appears well-developed and well-nourished.  HENT:  Head: Normocephalic and atraumatic.  Eyes: Pupils are equal, round, and reactive to light. EOM are normal.  Neck: Normal range of motion. Neck supple.  Cardiovascular: Normal rate and regular rhythm.  Respiratory: Effort normal and breath sounds normal.  GI: There is abdominal tenderness in the right lower quadrant. There is guarding and tenderness at McBurney's point. There is no rigidity and no rebound.  Musculoskeletal: Normal range of motion.  Neurological: She is alert and oriented to person, place, and time.  Skin: Skin is warm and dry.  Psychiatric: She has a normal mood and affect. Her behavior is normal. Judgment and thought content normal.     Assessment/Plan Acute appendicitis/uncomplicated-admit for IV antibiotics and laparoscopic appendectomy in the a.m.  There are multiple emergencies at this point time and we will go ahead and schedule her for a.m.  Start Zosyn 3.375 g IV q. 6 and allow clear liquids until midnight.  Discussed laparoscopic appendectomy with her as well as need for open surgery depending on intraoperative findings.  Dr. Georgette Dover to see in a.m.  Turner Daniels, MD 12/07/2018, 10:46 PM

## 2018-12-08 ENCOUNTER — Inpatient Hospital Stay (HOSPITAL_COMMUNITY): Payer: 59 | Admitting: Anesthesiology

## 2018-12-08 ENCOUNTER — Encounter (HOSPITAL_COMMUNITY): Admission: EM | Disposition: A | Discharge status: Home / Self Care | Payer: Self-pay | Source: Home / Self Care

## 2018-12-08 ENCOUNTER — Encounter (HOSPITAL_COMMUNITY): Payer: Self-pay | Admitting: *Deleted

## 2018-12-08 HISTORY — PX: LAPAROSCOPIC APPENDECTOMY: SHX408

## 2018-12-08 LAB — SURGICAL PCR SCREEN
MRSA, PCR: NEGATIVE
Staphylococcus aureus: NEGATIVE

## 2018-12-08 LAB — HIV ANTIBODY (ROUTINE TESTING W REFLEX): HIV Screen 4th Generation wRfx: NONREACTIVE

## 2018-12-08 SURGERY — APPENDECTOMY, LAPAROSCOPIC
Anesthesia: General

## 2018-12-08 MED ORDER — 0.9 % SODIUM CHLORIDE (POUR BTL) OPTIME
TOPICAL | Status: DC | PRN
  Administered 2018-12-08: 1000 mL

## 2018-12-08 MED ORDER — OXYCODONE HCL 5 MG PO TABS
5.0000 mg | ORAL_TABLET | ORAL | Status: DC | PRN
  Administered 2018-12-08 – 2018-12-09 (×4): 10 mg via ORAL
  Administered 2018-12-09 – 2018-12-10 (×2): 5 mg via ORAL
  Administered 2018-12-10 – 2018-12-13 (×9): 10 mg via ORAL
  Administered 2018-12-13 (×2): 5 mg via ORAL
  Administered 2018-12-14 (×3): 10 mg via ORAL
  Filled 2018-12-08 (×3): qty 2
  Filled 2018-12-08: qty 1
  Filled 2018-12-08 (×12): qty 2
  Filled 2018-12-08: qty 1
  Filled 2018-12-08 (×3): qty 2
  Filled 2018-12-08: qty 1

## 2018-12-08 MED ORDER — PHENYLEPHRINE 40 MCG/ML (10ML) SYRINGE FOR IV PUSH (FOR BLOOD PRESSURE SUPPORT)
PREFILLED_SYRINGE | INTRAVENOUS | Status: AC
  Filled 2018-12-08: qty 10

## 2018-12-08 MED ORDER — PROPOFOL 10 MG/ML IV BOLUS
INTRAVENOUS | Status: AC
  Filled 2018-12-08: qty 20

## 2018-12-08 MED ORDER — FENTANYL CITRATE (PF) 250 MCG/5ML IJ SOLN
INTRAMUSCULAR | Status: AC
  Filled 2018-12-08: qty 5

## 2018-12-08 MED ORDER — DIPHENHYDRAMINE HCL 50 MG/ML IJ SOLN: 25.0000 mg | Freq: Four times a day (QID) | INTRAMUSCULAR | Status: DC | PRN

## 2018-12-08 MED ORDER — ACETAMINOPHEN 325 MG PO TABS
650.0000 mg | ORAL_TABLET | Freq: Four times a day (QID) | ORAL | Status: DC | PRN
  Administered 2018-12-09 – 2018-12-10 (×3): 650 mg via ORAL
  Filled 2018-12-08 (×3): qty 2

## 2018-12-08 MED ORDER — MEPERIDINE HCL 25 MG/ML IJ SOLN: 6.2500 mg | INTRAMUSCULAR | Status: DC | PRN

## 2018-12-08 MED ORDER — MORPHINE SULFATE (PF) 2 MG/ML IV SOLN
2.0000 mg | INTRAVENOUS | Status: DC | PRN
  Administered 2018-12-08: 4 mg via INTRAVENOUS
  Administered 2018-12-10 (×2): 2 mg via INTRAVENOUS
  Filled 2018-12-08 (×3): qty 1
  Filled 2018-12-08: qty 2

## 2018-12-08 MED ORDER — LACTATED RINGERS IV SOLN
INTRAVENOUS | Status: DC | PRN
  Administered 2018-12-08 (×2): via INTRAVENOUS

## 2018-12-08 MED ORDER — LOSARTAN POTASSIUM 50 MG PO TABS: 50.0000 mg | ORAL_TABLET | Freq: Every day | ORAL | Status: DC

## 2018-12-08 MED ORDER — DOCUSATE SODIUM 100 MG PO CAPS
100.0000 mg | ORAL_CAPSULE | Freq: Two times a day (BID) | ORAL | Status: DC
  Administered 2018-12-08 – 2018-12-14 (×13): 100 mg via ORAL
  Filled 2018-12-08 (×13): qty 1

## 2018-12-08 MED ORDER — DEXAMETHASONE SODIUM PHOSPHATE 4 MG/ML IJ SOLN
INTRAMUSCULAR | Status: DC | PRN
  Administered 2018-12-08: 6 mg via INTRAVENOUS

## 2018-12-08 MED ORDER — HYDROMORPHONE HCL 1 MG/ML IJ SOLN: 0.2500 mg | INTRAMUSCULAR | Status: DC | PRN

## 2018-12-08 MED ORDER — LIDOCAINE HCL (CARDIAC) PF 100 MG/5ML IV SOSY
PREFILLED_SYRINGE | INTRAVENOUS | Status: DC | PRN
  Administered 2018-12-08: 100 mg via INTRAVENOUS

## 2018-12-08 MED ORDER — DIPHENHYDRAMINE HCL 25 MG PO CAPS: 25.0000 mg | ORAL_CAPSULE | Freq: Four times a day (QID) | ORAL | Status: DC | PRN

## 2018-12-08 MED ORDER — MIDAZOLAM HCL 2 MG/2ML IJ SOLN
INTRAMUSCULAR | Status: AC
  Filled 2018-12-08: qty 2

## 2018-12-08 MED ORDER — POTASSIUM CHLORIDE IN NACL 20-0.9 MEQ/L-% IV SOLN
INTRAVENOUS | Status: DC
  Administered 2018-12-08 – 2018-12-09 (×2): via INTRAVENOUS
  Filled 2018-12-08 (×2): qty 1000

## 2018-12-08 MED ORDER — MONTELUKAST SODIUM 10 MG PO TABS
10.0000 mg | ORAL_TABLET | Freq: Every day | ORAL | Status: DC
  Administered 2018-12-08 – 2018-12-13 (×6): 10 mg via ORAL
  Filled 2018-12-08 (×7): qty 1

## 2018-12-08 MED ORDER — BUPIVACAINE-EPINEPHRINE (PF) 0.25% -1:200000 IJ SOLN
INTRAMUSCULAR | Status: AC
  Filled 2018-12-08: qty 30

## 2018-12-08 MED ORDER — GABAPENTIN 300 MG PO CAPS
300.0000 mg | ORAL_CAPSULE | Freq: Every morning | ORAL | Status: DC
  Administered 2018-12-09 – 2018-12-14 (×6): 300 mg via ORAL
  Filled 2018-12-08 (×6): qty 1

## 2018-12-08 MED ORDER — SODIUM CHLORIDE 0.9 % IR SOLN
Status: DC | PRN
  Administered 2018-12-08: 1000 mL

## 2018-12-08 MED ORDER — PHENYLEPHRINE 40 MCG/ML (10ML) SYRINGE FOR IV PUSH (FOR BLOOD PRESSURE SUPPORT)
PREFILLED_SYRINGE | INTRAVENOUS | Status: DC | PRN
  Administered 2018-12-08 (×6): 80 ug via INTRAVENOUS
  Administered 2018-12-08: 40 ug via INTRAVENOUS

## 2018-12-08 MED ORDER — ROCURONIUM BROMIDE 100 MG/10ML IV SOLN
INTRAVENOUS | Status: DC | PRN
  Administered 2018-12-08: 10 mg via INTRAVENOUS
  Administered 2018-12-08: 50 mg via INTRAVENOUS

## 2018-12-08 MED ORDER — PROPOFOL 10 MG/ML IV BOLUS
INTRAVENOUS | Status: DC | PRN
  Administered 2018-12-08: 150 mg via INTRAVENOUS

## 2018-12-08 MED ORDER — ALBUTEROL SULFATE HFA 108 (90 BASE) MCG/ACT IN AERS
INHALATION_SPRAY | RESPIRATORY_TRACT | Status: AC
  Filled 2018-12-08: qty 6.7

## 2018-12-08 MED ORDER — DULOXETINE HCL 60 MG PO CPEP: 60.0000 mg | ORAL_CAPSULE | Freq: Every day | ORAL | Status: DC

## 2018-12-08 MED ORDER — BUPIVACAINE-EPINEPHRINE 0.25% -1:200000 IJ SOLN
INTRAMUSCULAR | Status: DC | PRN
  Administered 2018-12-08: 12 mL

## 2018-12-08 MED ORDER — HYDROMORPHONE HCL 1 MG/ML IJ SOLN
INTRAMUSCULAR | Status: AC
  Filled 2018-12-08: qty 1

## 2018-12-08 MED ORDER — FENTANYL CITRATE (PF) 100 MCG/2ML IJ SOLN
INTRAMUSCULAR | Status: DC | PRN
  Administered 2018-12-08 (×3): 50 ug via INTRAVENOUS

## 2018-12-08 MED ORDER — ONDANSETRON HCL 4 MG/2ML IJ SOLN: 4.0000 mg | Freq: Once | INTRAMUSCULAR | Status: DC | PRN

## 2018-12-08 MED ORDER — PIPERACILLIN-TAZOBACTAM 3.375 G IVPB
3.3750 g | Freq: Three times a day (TID) | INTRAVENOUS | Status: DC
  Administered 2018-12-08 – 2018-12-13 (×15): 3.375 g via INTRAVENOUS
  Filled 2018-12-08 (×15): qty 50

## 2018-12-08 MED ORDER — SUGAMMADEX SODIUM 200 MG/2ML IV SOLN
INTRAVENOUS | Status: DC | PRN
  Administered 2018-12-08: 100 mg via INTRAVENOUS
  Administered 2018-12-08: 60 mg via INTRAVENOUS
  Administered 2018-12-08: 200 mg via INTRAVENOUS
  Administered 2018-12-08: 140 mg via INTRAVENOUS

## 2018-12-08 MED ORDER — GABAPENTIN 300 MG PO CAPS
900.0000 mg | ORAL_CAPSULE | Freq: Every day | ORAL | Status: DC
  Administered 2018-12-08 – 2018-12-13 (×6): 900 mg via ORAL
  Filled 2018-12-08 (×6): qty 3

## 2018-12-08 MED ORDER — ACETAMINOPHEN 650 MG RE SUPP: 650.0000 mg | Freq: Four times a day (QID) | RECTAL | Status: DC | PRN

## 2018-12-08 MED ORDER — SUCCINYLCHOLINE CHLORIDE 20 MG/ML IJ SOLN
INTRAMUSCULAR | Status: DC | PRN
  Administered 2018-12-08: 200 mg via INTRAVENOUS

## 2018-12-08 MED ORDER — ONDANSETRON HCL 4 MG/2ML IJ SOLN
INTRAMUSCULAR | Status: DC | PRN
  Administered 2018-12-08: 4 mg via INTRAVENOUS

## 2018-12-08 MED ORDER — MIDAZOLAM HCL 5 MG/5ML IJ SOLN
INTRAMUSCULAR | Status: DC | PRN
  Administered 2018-12-08: 2 mg via INTRAVENOUS

## 2018-12-08 SURGICAL SUPPLY — 35 items
BENZOIN TINCTURE PRP APPL 2/3 (GAUZE/BANDAGES/DRESSINGS) ×2 IMPLANT
CANISTER SUCT 3000ML PPV (MISCELLANEOUS) ×2 IMPLANT
CHLORAPREP W/TINT 26 (MISCELLANEOUS) ×2 IMPLANT
COVER SURGICAL LIGHT HANDLE (MISCELLANEOUS) ×2 IMPLANT
CUTTER FLEX LINEAR 45M (STAPLE) ×2 IMPLANT
DRSG TEGADERM 2-3/8X2-3/4 SM (GAUZE/BANDAGES/DRESSINGS) ×6 IMPLANT
DRSG TEGADERM 4X4.75 (GAUZE/BANDAGES/DRESSINGS) ×2 IMPLANT
ELECT REM PT RETURN 9FT ADLT (ELECTROSURGICAL) ×2
ELECTRODE REM PT RTRN 9FT ADLT (ELECTROSURGICAL) ×1 IMPLANT
GAUZE SPONGE 2X2 8PLY STRL LF (GAUZE/BANDAGES/DRESSINGS) ×1 IMPLANT
GLOVE BIO SURGEON STRL SZ7 (GLOVE) ×2 IMPLANT
GLOVE BIOGEL PI IND STRL 7.5 (GLOVE) ×1 IMPLANT
GLOVE BIOGEL PI INDICATOR 7.5 (GLOVE) ×1
GOWN STRL REUS W/ TWL LRG LVL3 (GOWN DISPOSABLE) ×3 IMPLANT
GOWN STRL REUS W/TWL LRG LVL3 (GOWN DISPOSABLE) ×3
KIT BASIN OR (CUSTOM PROCEDURE TRAY) ×2 IMPLANT
KIT TURNOVER KIT B (KITS) ×2 IMPLANT
NS IRRIG 1000ML POUR BTL (IV SOLUTION) ×2 IMPLANT
PAD ARMBOARD 7.5X6 YLW CONV (MISCELLANEOUS) ×4 IMPLANT
POUCH SPECIMEN RETRIEVAL 10MM (ENDOMECHANICALS) ×2 IMPLANT
RELOAD STAPLE TA45 3.5 REG BLU (ENDOMECHANICALS) ×4 IMPLANT
SET IRRIG TUBING LAPAROSCOPIC (IRRIGATION / IRRIGATOR) ×2 IMPLANT
SET TUBE SMOKE EVAC HIGH FLOW (TUBING) ×2 IMPLANT
SHEARS HARMONIC ACE PLUS 36CM (ENDOMECHANICALS) ×2 IMPLANT
SLEEVE ENDOPATH XCEL 5M (ENDOMECHANICALS) ×2 IMPLANT
SPECIMEN JAR SMALL (MISCELLANEOUS) ×2 IMPLANT
SPONGE GAUZE 2X2 STER 10/PKG (GAUZE/BANDAGES/DRESSINGS) ×1
STRIP CLOSURE SKIN 1/2X4 (GAUZE/BANDAGES/DRESSINGS) ×2 IMPLANT
SUT MNCRL AB 4-0 PS2 18 (SUTURE) ×2 IMPLANT
TOWEL GREEN STERILE (TOWEL DISPOSABLE) ×2 IMPLANT
TOWEL GREEN STERILE FF (TOWEL DISPOSABLE) ×2 IMPLANT
TRAY LAPAROSCOPIC MC (CUSTOM PROCEDURE TRAY) ×2 IMPLANT
TROCAR XCEL BLUNT TIP 100MML (ENDOMECHANICALS) ×2 IMPLANT
TROCAR XCEL NON-BLD 5MMX100MML (ENDOMECHANICALS) ×2 IMPLANT
WATER STERILE IRR 1000ML POUR (IV SOLUTION) ×2 IMPLANT

## 2018-12-08 NOTE — Anesthesia Postprocedure Evaluation (Signed)
Anesthesia Post Note  Patient: Alyssa Clay  Procedure(s) Performed: APPENDECTOMY LAPAROSCOPIC (N/A )     Patient location during evaluation: PACU Anesthesia Type: General Level of consciousness: awake and alert Pain management: pain level controlled Vital Signs Assessment: post-procedure vital signs reviewed and stable Respiratory status: spontaneous breathing, nonlabored ventilation, respiratory function stable and patient connected to nasal cannula oxygen Cardiovascular status: blood pressure returned to baseline and stable Postop Assessment: no apparent nausea or vomiting Anesthetic complications: no    Last Vitals:  Vitals:   12/08/18 1053 12/08/18 1100  BP: 121/64 112/64  Pulse: (!) 116 (!) 110  Resp: 20   Temp: 36.9 C   SpO2: 93% 95%    Last Pain:  Vitals:   12/08/18 1216  TempSrc:   PainSc: 6                  Cyruss Arata DAVID

## 2018-12-08 NOTE — Plan of Care (Signed)
  Problem: Education: Goal: Knowledge of General Education information will improve Description Including pain rating scale, medication(s)/side effects and non-pharmacologic comfort measures Outcome: Progressing   

## 2018-12-08 NOTE — Progress Notes (Signed)
Tsuei MD aware of umbilicus dressing bleeding, MD verbal to reinforce dressing with guaze and tape. Floor RN is aware.

## 2018-12-08 NOTE — Anesthesia Procedure Notes (Signed)
Procedure Name: Intubation Date/Time: 12/08/2018 8:01 AM Performed by: Sammie Bench, CRNA Pre-anesthesia Checklist: Patient identified, Emergency Drugs available, Patient being monitored and Suction available Patient Re-evaluated:Patient Re-evaluated prior to induction Oxygen Delivery Method: Circle system utilized Preoxygenation: Pre-oxygenation with 100% oxygen Induction Type: Rapid sequence, IV induction and Cricoid Pressure applied Ventilation: Mask ventilation without difficulty Laryngoscope Size: Glidescope and 3 Grade View: Grade II Tube size: 7.0 mm Number of attempts: 1 Placement Confirmation: ETT inserted through vocal cords under direct vision and positive ETCO2 Secured at: 22 cm Tube secured with: Tape Dental Injury: Teeth and Oropharynx as per pre-operative assessment

## 2018-12-08 NOTE — Progress Notes (Signed)
Pt back from PACU to 6N27 s/p Lap Appe.  Lower abd dressing saturated with blood but has not come through the clear dressing over it, PACU nurse Mickel Baas will let Dt. Tsuei know about it.  Pt on O2/4L N/C.

## 2018-12-08 NOTE — Transfer of Care (Signed)
Immediate Anesthesia Transfer of Care Note  Patient: Alyssa Clay  Procedure(s) Performed: APPENDECTOMY LAPAROSCOPIC (N/A )  Patient Location: PACU  Anesthesia Type:General  Level of Consciousness: awake, alert , oriented and patient cooperative  Airway & Oxygen Therapy: Patient Spontanous Breathing and Patient connected to face mask oxygen  Post-op Assessment: Report given to RN and Post -op Vital signs reviewed and stable  Post vital signs: Reviewed and stable  Last Vitals:  Vitals Value Taken Time  BP 177/90 12/08/18 0953  Temp    Pulse 125 12/08/18 0954  Resp 23 12/08/18 0954  SpO2 96 % 12/08/18 0954  Vitals shown include unvalidated device data.  Last Pain:  Vitals:   12/08/18 0626  TempSrc:   PainSc: 4          Complications: No apparent anesthesia complications

## 2018-12-08 NOTE — Interval H&P Note (Signed)
History and Physical Interval Note:  12/08/2018 7:33 AM  Alyssa Clay  has presented today for surgery, with the diagnosis of APPENDICITIS.  The various methods of treatment have been discussed with the patient and family. After consideration of risks, benefits and other options for treatment, the patient has consented to  Procedure(s): APPENDECTOMY LAPAROSCOPIC (N/A) as a surgical intervention.  The patient's history has been reviewed, patient examined, no change in status, stable for surgery.  I have reviewed the patient's chart and labs.  Questions were answered to the patient's satisfaction.     Maia Petties

## 2018-12-08 NOTE — Progress Notes (Signed)
Patient transported to OR via bed. Report given to anesthesiologist. VS stable.

## 2018-12-08 NOTE — Progress Notes (Signed)
Patient admitted to 6N27 from  ED. Alert and oriented x4.VS stable,skin WNL. Pain 10/10, IV Dilaudid 1 mg given with relief. Oriented to room and call bell.Informed her of her of her scheduled surgery in am.

## 2018-12-08 NOTE — Anesthesia Preprocedure Evaluation (Signed)
Anesthesia Evaluation  Patient identified by MRN, date of birth, ID band Patient awake    Reviewed: Allergy & Precautions, NPO status , Patient's Chart, lab work & pertinent test results  Airway Mallampati: I  TM Distance: >3 FB Neck ROM: Full    Dental   Pulmonary asthma ,    Pulmonary exam normal        Cardiovascular hypertension, Pt. on medications Normal cardiovascular exam     Neuro/Psych Depression    GI/Hepatic GERD  Medicated and Controlled,  Endo/Other    Renal/GU      Musculoskeletal   Abdominal   Peds  Hematology   Anesthesia Other Findings   Reproductive/Obstetrics                             Anesthesia Physical Anesthesia Plan  ASA: III  Anesthesia Plan: General   Post-op Pain Management:    Induction: Rapid sequence, Cricoid pressure planned and Intravenous  PONV Risk Score and Plan: 3 and Ondansetron, Dexamethasone and Midazolam  Airway Management Planned: Oral ETT  Additional Equipment:   Intra-op Plan:   Post-operative Plan: Extubation in OR  Informed Consent: I have reviewed the patients History and Physical, chart, labs and discussed the procedure including the risks, benefits and alternatives for the proposed anesthesia with the patient or authorized representative who has indicated his/her understanding and acceptance.       Plan Discussed with: CRNA and Surgeon  Anesthesia Plan Comments:         Anesthesia Quick Evaluation

## 2018-12-08 NOTE — Op Note (Signed)
Appendectomy, Lap, Procedure Note  Indications: The patient presented with a history of right-sided abdominal pain. A CT scan revealed findings consistent with acute appendicitis without perforation.  Pre-operative Diagnosis: Acute appendicitis with generalized peritonitis  Post-operative Diagnosis: Perforated appendicitis with generalized peritonitis  Surgeon: Maia Petties   Assistants: Pryor Curia , RNFA  Anesthesia: General endotracheal anesthesia  ASA Class: 2E  Procedure Details  The patient was seen again in the Holding Room. The risks, benefits, complications, treatment options, and expected outcomes were discussed with the patient and/or family. The possibilities of reaction to medication, perforation of viscus, bleeding, recurrent infection, finding a normal appendix, the need for additional procedures, failure to diagnose a condition, and creating a complication requiring transfusion or operation were discussed. There was concurrence with the proposed plan and informed consent was obtained. The site of surgery was properly noted. The patient was taken to Operating Room, identified as Alyssa Clay and the procedure verified as Appendectomy. A Time Out was held and the above information confirmed.  The patient was placed in the supine position and general anesthesia was induced.  The abdomen was prepped and draped in a sterile fashion. A two-centimeter infraumbilical incision was made.  Dissection was carried down to the fascia bluntly.  The fascia was incised vertically.  We entered the peritoneal cavity bluntly.  A pursestring suture was passed around the incision with a 0 Vicryl.  The Hasson cannula was introduced into the abdomen and the tails of the suture were used to hold the Hasson in place.   The pneumoperitoneum was then established maintaining a maximum pressure of 15 mmHg.  Additional 5 mm cannulas then placed in the left lower quadrant of the abdomen and  the right upper quadrant under direct visualization. A careful evaluation of the entire abdomen was carried out. The patient was placed in Trendelenburg and left lateral decubitus position.  The scope was moved to the right upper quadrant port site. There was some visible fibrinous exudate in the right lower quadrant with the cecum adherent to the abdominal wall.  We bluntly dissected the cecum away.  The cecum was mobilized medially.  The appendix was identified and grasped with a clamp.  There is a small pinhole in the mid-portion of the appendix that is leaking some feculent material.   The appendix was carefully dissected. The appendix was skeletonized with the harmonic scalpel.   The appendix was divided at its base using an endo-GIA stapler. No apparent appendiceal stump was left in place. There was no evidence of bleeding, leakage, or complication after division of the appendix. Irrigation was also performed and irrigate suctioned from the abdomen as well.  We evacuated any visible spilled feculent material with the irrigation.  The appendix was removed with an Endocatch sac through the umbilical port site.  The umbilical port site was closed with the purse string suture. There was no residual palpable fascial defect.  The trocar site skin wounds were closed with 4-0 Monocryl.  Instrument, sponge, and needle counts were correct at the conclusion of the case.   Findings: The appendix was found to be inflamed. There were signs of necrosis.  There was perforation. There was not abscess formation.  Estimated Blood Loss:  Minimal         Drains: none         Specimens: appendix         Complications:  None; patient tolerated the procedure well.  Disposition: PACU - hemodynamically stable.         Condition: stable  Imogene Burn. Georgette Dover, MD, [facility] Beeper [phone]  12/08/2018 9:04 AM

## 2018-12-09 ENCOUNTER — Encounter: Payer: Self-pay | Admitting: Family Medicine

## 2018-12-09 ENCOUNTER — Encounter (HOSPITAL_COMMUNITY): Payer: Self-pay | Admitting: Surgery

## 2018-12-09 LAB — CBC
HCT: 31.6 % — ABNORMAL LOW (ref 36.0–46.0)
HCT: 32.6 % — ABNORMAL LOW (ref 36.0–46.0)
Hemoglobin: 9.8 g/dL — ABNORMAL LOW (ref 12.0–15.0)
Hemoglobin: 10.1 g/dL — ABNORMAL LOW (ref 12.0–15.0)
MCH: 29.2 pg (ref 26.0–34.0)
MCH: 28.9 pg (ref 26.0–34.0)
MCHC: 31 g/dL (ref 30.0–36.0)
MCHC: 31 g/dL (ref 30.0–36.0)
MCV: 94 fL (ref 80.0–100.0)
MCV: 93.4 fL (ref 80.0–100.0)
Platelets: 273 10*3/uL (ref 150–400)
Platelets: 295 10*3/uL (ref 150–400)
RBC: 3.36 MIL/uL — ABNORMAL LOW (ref 3.87–5.11)
RBC: 3.49 MIL/uL — ABNORMAL LOW (ref 3.87–5.11)
RDW: 13.7 % (ref 11.5–15.5)
RDW: 13.7 % (ref 11.5–15.5)
WBC: 16.6 10*3/uL — ABNORMAL HIGH (ref 4.0–10.5)
WBC: 15.7 10*3/uL — ABNORMAL HIGH (ref 4.0–10.5)
nRBC: 0 % (ref 0.0–0.2)
nRBC: 0 % (ref 0.0–0.2)

## 2018-12-09 LAB — BASIC METABOLIC PANEL
Anion gap: 8 (ref 5–15)
BUN: 18 mg/dL (ref 6–20)
CO2: 27 mmol/L (ref 22–32)
Calcium: 8.3 mg/dL — ABNORMAL LOW (ref 8.9–10.3)
Chloride: 102 mmol/L (ref 98–111)
Creatinine, Ser: 1.68 mg/dL — ABNORMAL HIGH (ref 0.44–1.00)
GFR calc Af Amer: 41 mL/min — ABNORMAL LOW (ref >60)
GFR calc non Af Amer: 35 mL/min — ABNORMAL LOW (ref >60)
Glucose, Bld: 121 mg/dL — ABNORMAL HIGH (ref 70–99)
Potassium: 4.5 mmol/L (ref 3.5–5.1)
Sodium: 137 mmol/L (ref 135–145)

## 2018-12-09 MED ORDER — POLYETHYLENE GLYCOL 3350 17 G PO PACK
17.0000 g | PACK | Freq: Every day | ORAL | Status: DC
  Administered 2018-12-09 – 2018-12-14 (×6): 17 g via ORAL
  Filled 2018-12-09 (×6): qty 1

## 2018-12-09 MED ORDER — ENOXAPARIN SODIUM 80 MG/0.8ML ~~LOC~~ SOLN
65.0000 mg | SUBCUTANEOUS | Status: DC
  Administered 2018-12-10 – 2018-12-14 (×5): 65 mg via SUBCUTANEOUS
  Filled 2018-12-09 (×5): qty 0.8

## 2018-12-09 MED ORDER — FLUCONAZOLE 50 MG PO TABS
50.0000 mg | ORAL_TABLET | Freq: Every day | ORAL | Status: DC
  Administered 2018-12-09 – 2018-12-11 (×3): 50 mg via ORAL
  Filled 2018-12-09 (×3): qty 1

## 2018-12-09 MED ORDER — SODIUM CHLORIDE 0.9 % IV BOLUS
500.0000 mL | Freq: Once | INTRAVENOUS | Status: AC
  Administered 2018-12-09: 500 mL via INTRAVENOUS

## 2018-12-09 MED ORDER — SODIUM CHLORIDE 0.9 % IV BOLUS
1000.0000 mL | Freq: Once | INTRAVENOUS | Status: AC
  Administered 2018-12-09: 1000 mL via INTRAVENOUS

## 2018-12-09 MED ORDER — SODIUM CHLORIDE 0.9 % IV SOLN
INTRAVENOUS | Status: DC
  Administered 2018-12-09 – 2018-12-11 (×6): via INTRAVENOUS

## 2018-12-09 NOTE — Plan of Care (Signed)
  Problem: Clinical Measurements: Goal: Postoperative complications will be avoided or minimized Outcome: Progressing   Problem: Skin Integrity: Goal: Demonstration of wound healing without infection will improve Outcome: Progressing   

## 2018-12-09 NOTE — Plan of Care (Signed)
  Problem: Clinical Measurements: Goal: Postoperative complications will be avoided or minimized Outcome: Progressing   

## 2018-12-09 NOTE — Progress Notes (Signed)
Pharmacy - Lovenox  For DVT prophylaxis Weight = 130 kg BMI= 47  Plan: Increase Lovenox to 0.5 mg / kg sq Q 24 starting 8/18  Thank you Anette Guarneri, PharmD

## 2018-12-09 NOTE — Progress Notes (Signed)
[facility] Progress Note  1 Day Post-Op  Subjective: CC-  Sore but overall doing ok. She complains of RLQ pain and bloating. Denies n/v. She is burping. No flatus or BM. Tolerating diet. Ambulated to restroom without issues.  Objective: Vital signs in last 24 hours: Temp:  [98.4 F (36.9 C)-98.6 F (37 C)] 98.5 F (36.9 C) (08/17 0422) Pulse Rate:  [104-126] 104 (08/17 0422) Resp:  [15-20] 19 (08/17 0422) BP: (112-177)/(63-90) 124/76 (08/17 0422) SpO2:  [93 %-99 %] 99 % (08/17 0422) Last BM Date: 12/07/18  Intake/Output from previous day: 08/16 0701 - 08/17 0700 In: 2412.3 [P.O.:400; I.V.:1891.6; IV Piggyback:120.7] Out: 560 [Urine:550; Blood:10] Intake/Output this shift: No intake/output data recorded.  PE: Gen:  Alert, NAD, pleasant HEENT: EOM's intact, pupils equal and round Pulm:  Rate and effort normal Abd: Soft, mild distension, +BS, lap incisions with cdi/ blood noted on periumbilical incision dressing, RLQ TTP, no peritonitis Skin: no rashes noted, warm and dry  Lab Results:  Recent Labs    12/07/18 1701 12/09/18 0723  WBC 18.1* 16.6*  HGB 12.0 9.8*  HCT 37.7 31.6*  PLT 351 273   BMET Recent Labs    12/07/18 1701 12/09/18 0723  NA 138 137  K 3.9 4.5  CL 103 102  CO2 24 27  GLUCOSE 124* 121*  BUN 11 18  CREATININE 0.85 1.68*  CALCIUM 9.1 8.3*   PT/INR No results for input(s): LABPROT, INR in the last 72 hours. CMP     Component Value Date/Time   NA 137 12/09/2018 0723   K 4.5 12/09/2018 0723   CL 102 12/09/2018 0723   CO2 27 12/09/2018 0723   GLUCOSE 121 (H) 12/09/2018 0723   BUN 18 12/09/2018 0723   CREATININE 1.68 (H) 12/09/2018 0723   CREATININE 0.69 07/04/2018 1009   CALCIUM 8.3 (L) 12/09/2018 0723   PROT 7.0 12/07/2018 1701   ALBUMIN 3.8 12/07/2018 1701   AST 16 12/07/2018 1701   ALT 17 12/07/2018 1701   ALKPHOS 61 12/07/2018 1701   BILITOT 0.3 12/07/2018 1701   GFRNONAA 35 (L) 12/09/2018 0723   GFRNONAA 102  07/04/2018 1009   GFRAA 41 (L) 12/09/2018 0723   GFRAA 118 07/04/2018 1009   Lipase     Component Value Date/Time   LIPASE 22 12/07/2018 1701       Studies/Results: Ct Abdomen Pelvis W Contrast  Result Date: 12/07/2018 CLINICAL DATA:  Abdominal distension and RIGHT lower quadrant pain. EXAM: CT ABDOMEN AND PELVIS WITH CONTRAST TECHNIQUE: Multidetector CT imaging of the abdomen and pelvis was performed using the standard protocol following bolus administration of intravenous contrast. CONTRAST:  162mL OMNIPAQUE IOHEXOL 300 MG/ML  SOLN COMPARISON:  None. FINDINGS: Lower chest: Mild bibasilar atelectasis. Elevated RIGHT hemidiaphragm, of uncertain chronicity. Hepatobiliary: No focal liver abnormality is seen. No gallstones, gallbladder wall thickening, or biliary dilatation. Pancreas: Unremarkable. No pancreatic ductal dilatation or surrounding inflammatory changes. Spleen: Normal in size without focal abnormality. Adrenals/Urinary Tract: Adrenal glands appear normal. Kidneys are unremarkable without suspicious mass, stone or hydronephrosis. No ureteral or bladder calculi identified. Bladder is unremarkable, partially decompressed. Stomach/Bowel: No dilated large or small bowel loops. No evidence of bowel wall inflammation. Appendix: Location: Below the cecum in the RIGHT lower quadrant Diameter: 16 mm Appendicolith: 7 mm at the base of the appendix. Mucosal hyper-enhancement: Not convincingly Extraluminal gas: Not Seen Periappendiceal collection: Not Seen Vascular/Lymphatic: Aortic atherosclerosis. No enlarged lymph nodes seen. Reproductive: Intrauterine device appears appropriately positioned. No adnexal mass  or free fluid. Other: No abscess collection. No free intraperitoneal air. Musculoskeletal: No acute or suspicious osseous finding. Mild degenerative spondylosis of the lumbar spine. IMPRESSION: Acute appendicitis, as detailed above. Appendix is distended to 16 mm diameter and the walls of the  appendix appear thickened. There is associated periappendiceal inflammation. No associated abscess collection or free intraperitoneal air. Aortic Atherosclerosis (ICD10-I70.0). Electronically Signed   By: Franki Cabot M.D.   On: 12/07/2018 19:36    Anti-infectives: Anti-infectives (From admission, onward)   Start     Dose/Rate Route Frequency Ordered Stop   12/08/18 1330  piperacillin-tazobactam (ZOSYN) IVPB 3.375 g     3.375 g 12.5 mL/hr over 240 Minutes Intravenous Every 8 hours 12/08/18 1116     12/08/18 0400  piperacillin-tazobactam (ZOSYN) IVPB 3.375 g  Status:  Discontinued     3.375 g 12.5 mL/hr over 240 Minutes Intravenous Every 8 hours 12/07/18 2345 12/08/18 1116   12/07/18 2215  piperacillin-tazobactam (ZOSYN) IVPB 3.375 g     3.375 g 100 mL/hr over 30 Minutes Intravenous  Once 12/07/18 2211 12/07/18 2253       Assessment/Plan HTN - home meds Depression - home meds Migraine  Perforated appendicitis with generalized peritonitis S/p laparoscopic appendectomy 8/16 Dr. Georgette Dover - POD#1 - mobilize - Continue diet as tolerated and await return in bowel function. Add colace/miralax. Repeat labs in AM.  AKI - Creatinine up to 1.68. monitor UOP. Change IVF to 0.9% NaCl and increase to 100cc/hr. Repeat BMP in AM  ID - zosyn 8/15>>. will need 10 days of antibiotics  FEN - IVF @100cc /hr, HH diet VTE - SCDs, lovenox Foley - none Follow up - [facility]   LOS: 2 days    Wellington Hampshire , [facility] 12/09/2018, 8:53 AM Pager: [phone] Mon-Thurs 7:00 am-4:30 pm Fri 7:00 am -11:30 AM Sat-Sun 7:00 am-11:30 am

## 2018-12-10 ENCOUNTER — Inpatient Hospital Stay (HOSPITAL_COMMUNITY): Payer: 59

## 2018-12-10 ENCOUNTER — Telehealth: Payer: Self-pay

## 2018-12-10 DIAGNOSIS — D72829 Elevated white blood cell count, unspecified: Secondary | ICD-10-CM

## 2018-12-10 DIAGNOSIS — I959 Hypotension, unspecified: Secondary | ICD-10-CM

## 2018-12-10 DIAGNOSIS — N179 Acute kidney failure, unspecified: Secondary | ICD-10-CM

## 2018-12-10 LAB — BASIC METABOLIC PANEL
Anion gap: 10 (ref 5–15)
BUN: 27 mg/dL — ABNORMAL HIGH (ref 6–20)
CO2: 22 mmol/L (ref 22–32)
Calcium: 8 mg/dL — ABNORMAL LOW (ref 8.9–10.3)
Chloride: 101 mmol/L (ref 98–111)
Creatinine, Ser: 2.2 mg/dL — ABNORMAL HIGH (ref 0.44–1.00)
GFR calc Af Amer: 29 mL/min — ABNORMAL LOW (ref >60)
GFR calc non Af Amer: 25 mL/min — ABNORMAL LOW (ref >60)
Glucose, Bld: 122 mg/dL — ABNORMAL HIGH (ref 70–99)
Potassium: 3.7 mmol/L (ref 3.5–5.1)
Sodium: 133 mmol/L — ABNORMAL LOW (ref 135–145)

## 2018-12-10 LAB — CBC
HCT: 32 % — ABNORMAL LOW (ref 36.0–46.0)
Hemoglobin: 9.8 g/dL — ABNORMAL LOW (ref 12.0–15.0)
MCH: 29 pg (ref 26.0–34.0)
MCHC: 30.6 g/dL (ref 30.0–36.0)
MCV: 94.7 fL (ref 80.0–100.0)
Platelets: 306 10*3/uL (ref 150–400)
RBC: 3.38 MIL/uL — ABNORMAL LOW (ref 3.87–5.11)
RDW: 13.9 % (ref 11.5–15.5)
WBC: 15 10*3/uL — ABNORMAL HIGH (ref 4.0–10.5)
nRBC: 0 % (ref 0.0–0.2)

## 2018-12-10 IMAGING — US US RENAL
1 series · 14 of 25 positions shown · non-contrast
Comparison: None.

CLINICAL DATA: Acute kidney injury

EXAM:
RENAL / URINARY TRACT ULTRASOUND COMPLETE

[Series 1: us renal · 14 of 32 slices shown]
[im 1/32]
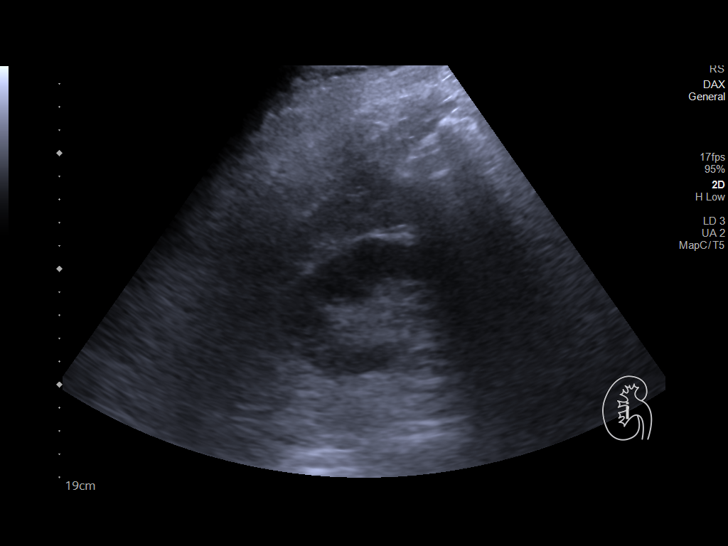
[im 3/32]
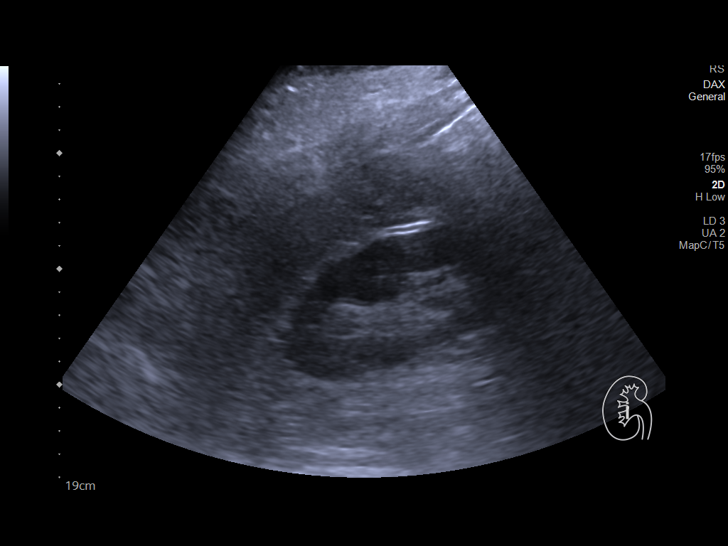
[im 6/32]
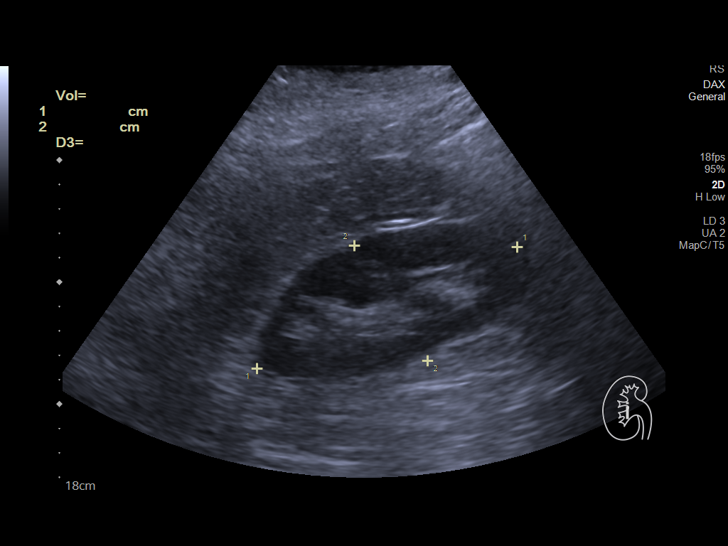
[im 8/32]
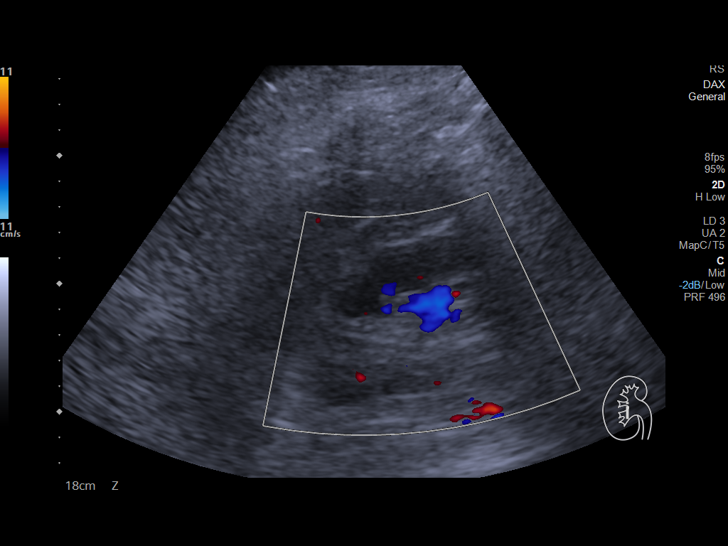
[im 11/32]
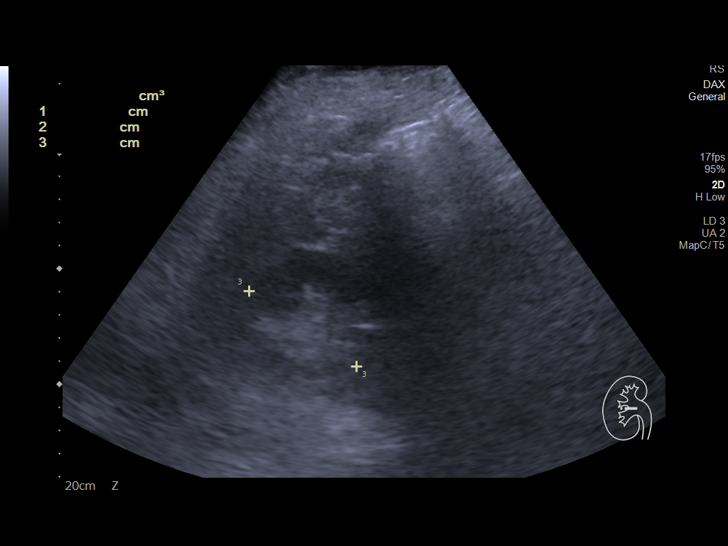
[im 12/32]
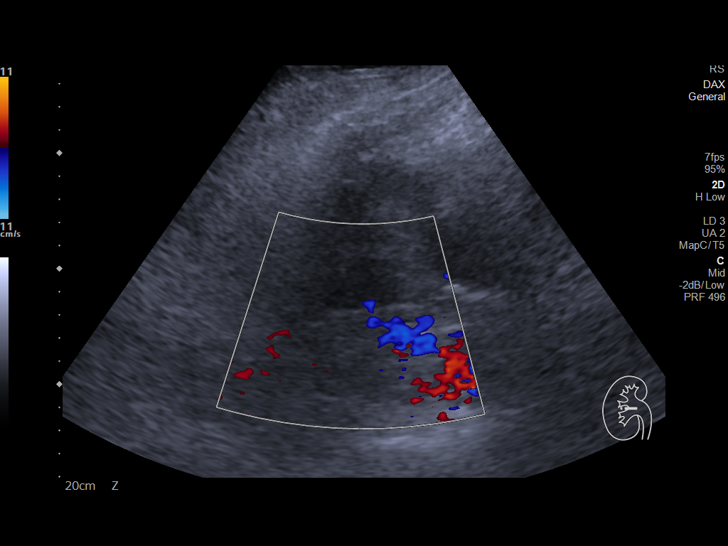
[im 15/32]
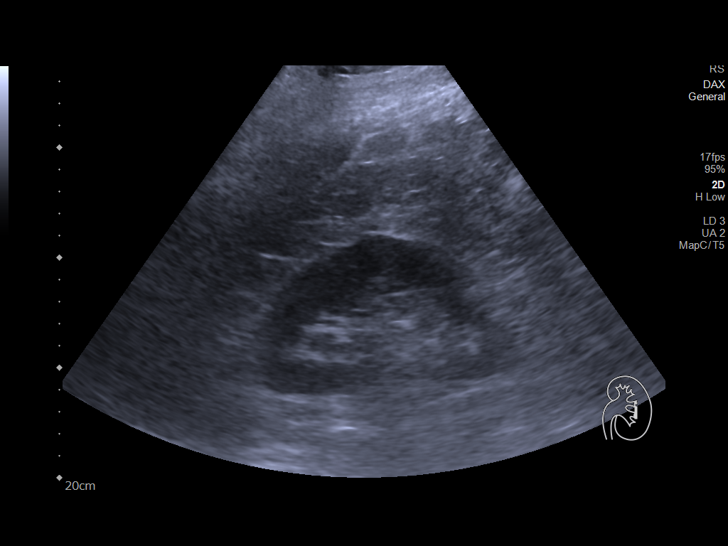
[im 17/32]
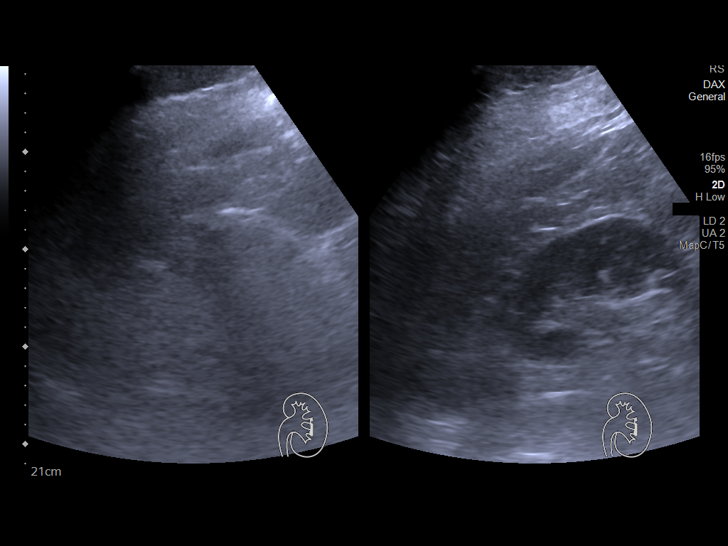
[im 20/32]
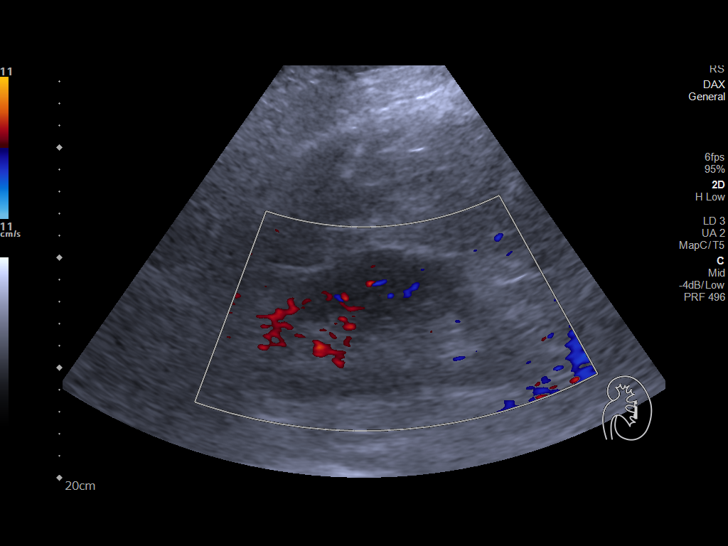
[im 21/32]
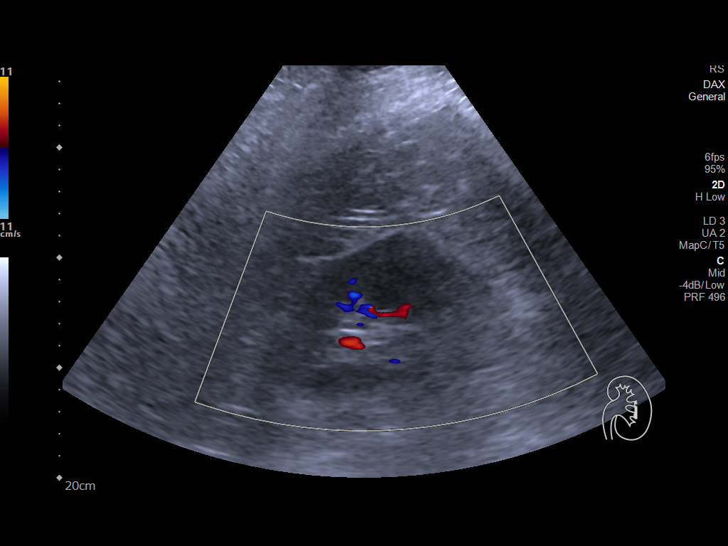
[im 24/32]
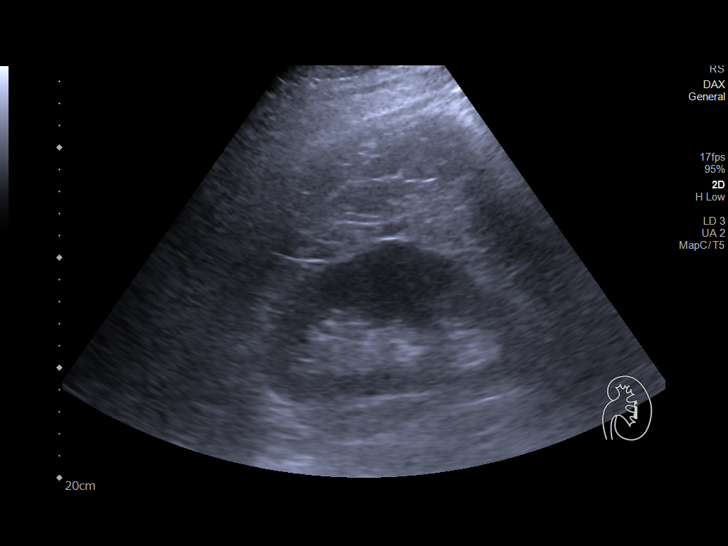
[im 26/32]
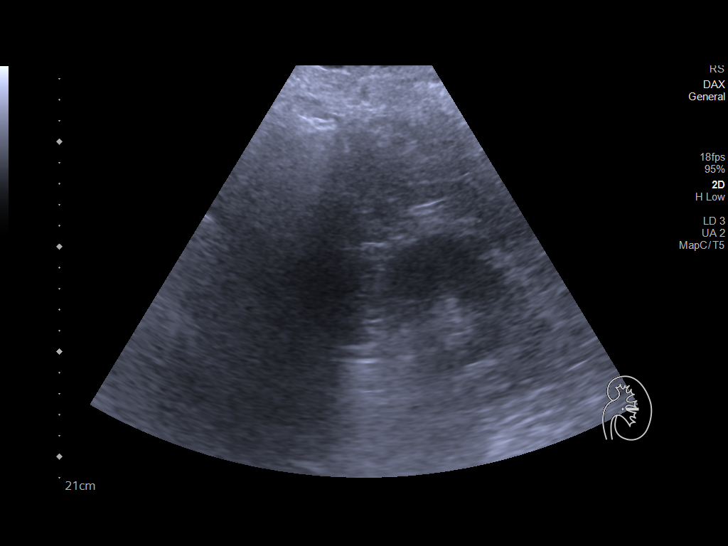
[im 29/32]
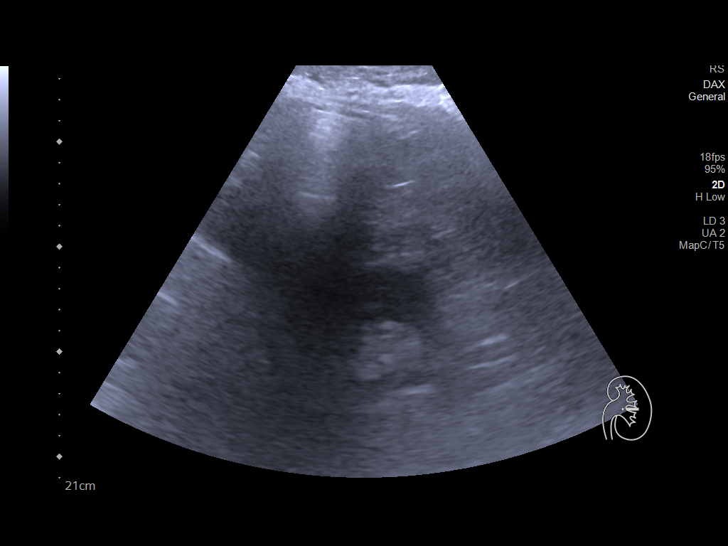
[im 32/32]
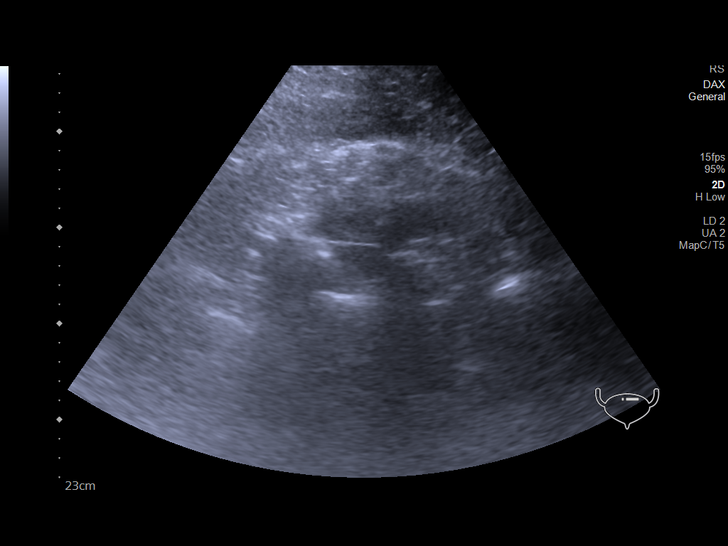

[14 of 25 positions shown; findings below may reference images not displayed]

FINDINGS: Right Kidney:

Renal measurements: 11.8 x 5.6 x 5.7 cm = volume: 195 mL .
Echogenicity within normal limits. No mass or hydronephrosis
visualized.

Left Kidney:

Renal measurements: 12.3 x 6.8 x 6.5 cm = volume: 284 mL.
Echogenicity within normal limits. No mass or hydronephrosis
visualized.

Bladder:

Appears normal for degree of bladder distention.
IMPRESSION: Normal study.

## 2018-12-10 MED ORDER — SODIUM CHLORIDE 0.9 % IV BOLUS
1000.0000 mL | Freq: Once | INTRAVENOUS | Status: AC
  Administered 2018-12-10: 1000 mL via INTRAVENOUS

## 2018-12-10 MED ORDER — ALUM & MAG HYDROXIDE-SIMETH 200-200-20 MG/5ML PO SUSP
30.0000 mL | ORAL | Status: DC | PRN
  Administered 2018-12-10 – 2018-12-12 (×2): 30 mL via ORAL
  Filled 2018-12-10 (×2): qty 30

## 2018-12-10 NOTE — Consult Note (Addendum)
Medical Consultation   Alyssa Clay  DTO:[id number]  DOB: [birth year]  DOA: 12/07/2018  PCP: Caren Macadam, MD  Requesting physician: Modena Jansky, PA-C for surgery   Reason for consultation: Acute kidney injury  History of Present Illness: Alyssa Clay is an 53 y.o. female with past medical history significant for retention, asthma, and depression; who presented with complaints of right lower quadrant abdominal pain over the last 2 weeks.  She had initially attributed pain symptoms to new medication of Wellbutrin which she self stopped.  However, pain became more severe and sharp.  Prior to coming into the hospital she had also developed persistent nausea and vomiting.  CT scan showed acute appendicitis without perforation.  Patient underwent laparoscopic appendectomy on 8/16 with Dr. Georgette Dover.  Following the procedure she reports still having some mild 2/10 right-sided back pain.  Denies any difficulty urinating, but has not had a bowel movement yet.  She is passing flatus.  Labs revealed rising creatinine from 0.85-> 1.68-> 2.2.  Patient has been tolerating her diet and states that she is urinating without any problem.  Overnight patient noted to have low blood pressures in document as low as 78/42 despite being on fluids.  She denies having any nausea or vomiting since the operation    Review of Systems:  Review of Systems  Gastrointestinal: Negative for abdominal pain, diarrhea, nausea and vomiting.  Genitourinary: Negative for dysuria and frequency.  Musculoskeletal: Positive for back pain.   As per HPI otherwise 10 point review of systems negative.     Past Medical History: Past Medical History:  Diagnosis Date  . Asthma   . Depression   . Hypertension   . Migraine     Past Surgical History: Past Surgical History:  Procedure Laterality Date  . CESAREAN SECTION  1994  . DILATION AND CURETTAGE OF UTERUS     multiple due to  miscarriages  . KNEE ARTHROSCOPY Left 2013  . LAPAROSCOPIC APPENDECTOMY N/A 12/08/2018   Procedure: APPENDECTOMY LAPAROSCOPIC;  Surgeon: Donnie Mesa, MD;  Location: [facility];  Service: General;  Laterality: N/A;  . TENNIS ELBOW RELEASE/NIRSCHEL PROCEDURE  2009  . WISDOM TOOTH EXTRACTION       Allergies:  No Known Allergies   Social History:  reports that she has never smoked. She has never used smokeless tobacco. She reports current alcohol use. She reports that she does not use drugs.   Family History: Family History  Problem Relation Age of Onset  . Cancer Mother 34       lymphoma  . Depression Mother   . Hypertension Father   . Heart attack Father 66  . Hypertension Brother   . High blood pressure Brother   . High Cholesterol Brother   . Healthy Daughter     Physical Exam: Vitals:   12/09/18 2043 12/09/18 2329 12/09/18 2332 12/10/18 0437  BP: (!) 88/49 (!) 90/54 (!) 94/49 (!) 98/58  Pulse: 95 98 98 98  Resp: 16   16  Temp: 98 F (36.7 C)   98.5 F (36.9 C)  TempSrc: Oral   Oral  SpO2: 94%   93%  Weight:      Height:        Constitutional:  Alert and awake, oriented x3, not in any acute distress. Eyes: PERLA, EOMI, irises appear normal, anicteric sclera,  ENMT: external ears and nose appear normal,  Lips appears normal, oropharynx mucosa, tongue, posterior pharynx appear normal  Neck: neck appears normal, no masses, normal ROM, no thyromegaly, no JVD  CVS: S1-S2 clear, no murmur rubs or gallops, no LE edema, normal pedal pulses  Respiratory:  clear to auscultation bilaterally, no wheezing, rales or rhonchi. Respiratory effort normal. No accessory muscle use.  Abdomen: Protuberant, nondistended, bowel sounds slightly decreased, no hepatosplenomegaly, no hernias.  Laparoscopic wounds healing without signs of erythema. Musculoskeletal:  no cyanosis, clubbing or edema noted bilaterally Neuro: Cranial nerves II-XII intact, strength, sensation, reflexes  Psych: judgement and insight appear normal, stable mood and affect, mental status Skin: no rashes or nodules appreciated   Data reviewed:  I have personally reviewed following labs and imaging studies Labs:  CBC: Recent Labs  Lab 12/07/18 1701 12/09/18 0723 12/09/18 1652 12/10/18 0108  WBC 18.1* 16.6* 15.7* 15.0*  HGB 12.0 9.8* 10.1* 9.8*  HCT 37.7 31.6* 32.6* 32.0*  MCV 92.2 94.0 93.4 94.7  PLT 351 273 295 993    Basic Metabolic Panel: Recent Labs  Lab 12/07/18 1701 12/09/18 0723 12/10/18 0108  NA 138 137 133*  K 3.9 4.5 3.7  CL 103 102 101  CO2 24 27 22   GLUCOSE 124* 121* 122*  BUN 11 18 27*  CREATININE 0.85 1.68* 2.20*  CALCIUM 9.1 8.3* 8.0*   GFR Estimated Creatinine Clearance: 41.6 mL/min (A) (by C-G formula based on SCr of 2.2 mg/dL (H)). Liver Function Tests: Recent Labs  Lab 12/07/18 1701  AST 16  ALT 17  ALKPHOS 61  BILITOT 0.3  PROT 7.0  ALBUMIN 3.8   Recent Labs  Lab 12/07/18 1701  LIPASE 22   No results for input(s): AMMONIA in the last 168 hours. Coagulation profile No results for input(s): INR, PROTIME in the last 168 hours.  Cardiac Enzymes: No results for input(s): CKTOTAL, CKMB, CKMBINDEX, TROPONINI in the last 168 hours. BNP: Invalid input(s): POCBNP CBG: No results for input(s): GLUCAP in the last 168 hours. D-Dimer No results for input(s): DDIMER in the last 72 hours. Hgb A1c No results for input(s): HGBA1C in the last 72 hours. Lipid Profile No results for input(s): CHOL, HDL, LDLCALC, TRIG, CHOLHDL, LDLDIRECT in the last 72 hours. Thyroid function studies No results for input(s): TSH, T4TOTAL, T3FREE, THYROIDAB in the last 72 hours.  Invalid input(s): FREET3 Anemia work up No results for input(s): VITAMINB12, FOLATE, FERRITIN, TIBC, IRON, RETICCTPCT in the last 72 hours. Urinalysis    Component Value Date/Time   COLORURINE YELLOW 12/07/2018 1827   APPEARANCEUR HAZY (A) 12/07/2018 1827   LABSPEC 1.026 12/07/2018  1827   PHURINE 6.0 12/07/2018 1827   GLUCOSEU NEGATIVE 12/07/2018 1827   HGBUR NEGATIVE 12/07/2018 1827   BILIRUBINUR NEGATIVE 12/07/2018 1827   KETONESUR NEGATIVE 12/07/2018 1827   PROTEINUR 30 (A) 12/07/2018 1827   NITRITE NEGATIVE 12/07/2018 1827   LEUKOCYTESUR NEGATIVE 12/07/2018 1827     Microbiology Recent Results (from the past 240 hour(s))  SARS Coronavirus 2 [facility] order, Performed in [facility] hospital lab) Nasopharyngeal Nasopharyngeal Swab     Status: None   Collection Time: 12/07/18  8:56 PM   Specimen: Nasopharyngeal Swab  Result Value Ref Range Status   SARS Coronavirus 2 NEGATIVE NEGATIVE Final    Comment: (NOTE) If result is NEGATIVE SARS-CoV-2 target nucleic acids are NOT DETECTED. The SARS-CoV-2 RNA is generally detectable in upper and lower  respiratory specimens during the acute phase of infection. The lowest  concentration of SARS-CoV-2 viral copies this assay  can detect is 250  copies / mL. A negative result does not preclude SARS-CoV-2 infection  and should not be used as the sole basis for treatment or other  patient management decisions.  A negative result may occur with  improper specimen collection / handling, submission of specimen other  than nasopharyngeal swab, presence of viral mutation(s) within the  areas targeted by this assay, and inadequate number of viral copies  (<250 copies / mL). A negative result must be combined with clinical  observations, patient history, and epidemiological information. If result is POSITIVE SARS-CoV-2 target nucleic acids are DETECTED. The SARS-CoV-2 RNA is generally detectable in upper and lower  respiratory specimens dur ing the acute phase of infection.  Positive  results are indicative of active infection with SARS-CoV-2.  Clinical  correlation with patient history and other diagnostic information is  necessary to determine patient infection status.  Positive results do  not rule out bacterial infection  or co-infection with other viruses. If result is PRESUMPTIVE POSTIVE SARS-CoV-2 nucleic acids MAY BE PRESENT.   A presumptive positive result was obtained on the submitted specimen  and confirmed on repeat testing.  While 2019 novel coronavirus  (SARS-CoV-2) nucleic acids may be present in the submitted sample  additional confirmatory testing may be necessary for epidemiological  and / or clinical management purposes  to differentiate between  SARS-CoV-2 and other Sarbecovirus currently known to infect humans.  If clinically indicated additional testing with an alternate test  methodology [phone]) is advised. The SARS-CoV-2 RNA is generally  detectable in upper and lower respiratory sp ecimens during the acute  phase of infection. The expected result is Negative. Fact Sheet for Patients:  StrictlyIdeas.no Fact Sheet for Healthcare Providers: BankingDealers.co.za This test is not yet approved or cleared by the [place] FDA and has been authorized for detection and/or diagnosis of SARS-CoV-2 by FDA under an Emergency Use Authorization (EUA).  This EUA will remain in effect (meaning this test can be used) for the duration of the COVID-19 declaration under Section 564(b)(1) of the Act, 21 U.S.C. section 360bbb-3(b)(1), unless the authorization is terminated or revoked sooner. Performed at [facility], [place] [address] [place], [place] [address]   Surgical pcr screen     Status: None   Collection Time: 12/08/18 12:25 AM   Specimen: Nasal Mucosa; Nasal Swab  Result Value Ref Range Status   MRSA, PCR NEGATIVE NEGATIVE Final   Staphylococcus aureus NEGATIVE NEGATIVE Final    Comment: (NOTE) The Xpert SA Assay (FDA approved for NASAL specimens in patients 43 years of age and older), is one component of a comprehensive surveillance program. It is not intended to diagnose infection nor to guide or monitor treatment. Performed at  [facility], [place] [address] [place], [place] [address]        Inpatient Medications:   Scheduled Meds: . docusate sodium  100 mg Oral BID  . DULoxetine  60 mg Oral Daily  . enoxaparin (LOVENOX) injection  65 mg Subcutaneous Q24H  . fluconazole  50 mg Oral Daily  . gabapentin  300 mg Oral q morning - 10a  . gabapentin  900 mg Oral QHS  . montelukast  10 mg Oral QHS  . pantoprazole  40 mg Oral Daily  . polyethylene glycol  17 g Oral Daily   Continuous Infusions: . sodium chloride 150 mL/hr at 12/10/18 0834  . piperacillin-tazobactam (ZOSYN)  IV 3.375 g (12/10/18 0436)     Radiological Exams  on Admission: No results found.  Impression/Recommendations Perforated appendicitis: Postop day 2 from a laparoscopic appendectomy with Dr. Gershon Crane on 8/16 for perforated appendicitis with generalized peritonitis.  Antibiotics of Zosyn are continued. -Per surgery  Leukocytosis: WBC slowly trending down from 18.1 down to 15 today. -Per primary  Acute kidney injury: Patient baseline creatinine previously noted to be around 0.85 on admission, but trended up from 1.68->2.2 with BUN 27.  Urinalysis on admission was negative for any signs of infection, but did show a specific gravity at the upper limit of normal.  Suspect prerenal in nature given low blood pressures, but question possibility obstruction.  -Strict intake and output -Check renal ultrasound  -Check urine sodium and urine creatinine to calculate FeNa -Bolus 1 L normal saline IV fluids, then continue at 150 mL/h -Daily monitoring of creatinine  Hypotension: Blood pressures noted to be as low as 78/42. -Agree with continue holding of losartan  Normocytic anemia: Acute.  Hemoglobin postop appears relatively stable at 9.8. -Continue to monitor  Depression  -Continue Cymbalta   Thank you for this consultation.  Our [facility] hospitalist team will follow the patient with you.   Time Spent: 20 minutes  Norval Morton M.D.  [facility] 12/10/2018, 8:48 AM

## 2018-12-10 NOTE — Telephone Encounter (Signed)
Patient called to inform Dr. Maudie Mercury that she is currently in the hospital because her Appendix bursted and she has developed an infection.   She states she did have surgery a couple of days ago to have her appendix removed but was having a hard time breathing and keeping her BP up. She was also calling to find out the names her medication so she could inform the hospital staff. Informed patient of the names of her medications.

## 2018-12-10 NOTE — Progress Notes (Signed)
2 Days Post-Op    CC: Abdominal pain  Subjective: Patient seen in bed.  Her blood pressures been down throughout the evening.  Her creatinine function has also declined.  She is tolerating a soft diet.  Her port sites look fine.  Dressing was removed on 2 of them sites were cleaned and redressed.  Objective: Vital signs in last 24 hours: Temp:  [98 F (36.7 C)-98.5 F (36.9 C)] 98.5 F (36.9 C) (08/18 0437) Pulse Rate:  [95-105] 98 (08/18 0437) Resp:  [16-18] 16 (08/18 0437) BP: (78-98)/(42-66) 98/58 (08/18 0437) SpO2:  [93 %-96 %] 93 % (08/18 0437) Last BM Date: 12/07/18 960 p.o. 2300 IV Afebrile blood pressures down some last p.m. O2 sats are 93% on 1 L nasal cannula NA is 133 creatinine is up to 2.20. WBC remains elevated at 15.0  Intake/Output from previous day: 08/17 0701 - 08/18 0700 In: 3192.8 [P.O.:960; I.V.:1582.8; IV Piggyback:650] Out: -  Intake/Output this shift: No intake/output data recorded.  General appearance: alert, cooperative and no distress Resp: clear to auscultation bilaterally and She is only moving about 700 on the incentive spirometry. GI: Soft, sore, port sites are okay.  Dressings were removed onto them, they were cleaned and replaced.  Third site is dry and original dressing intact.  Lab Results:  Recent Labs    12/09/18 1652 12/10/18 0108  WBC 15.7* 15.0*  HGB 10.1* 9.8*  HCT 32.6* 32.0*  PLT 295 306    BMET Recent Labs    12/09/18 0723 12/10/18 0108  NA 137 133*  K 4.5 3.7  CL 102 101  CO2 27 22  GLUCOSE 121* 122*  BUN 18 27*  CREATININE 1.68* 2.20*  CALCIUM 8.3* 8.0*   PT/INR No results for input(s): LABPROT, INR in the last 72 hours.  Recent Labs  Lab 12/07/18 1701  AST 16  ALT 17  ALKPHOS 61  BILITOT 0.3  PROT 7.0  ALBUMIN 3.8     Lipase     Component Value Date/Time   LIPASE 22 12/07/2018 1701     Medications: . docusate sodium  100 mg Oral BID  . DULoxetine  60 mg Oral Daily  . enoxaparin  (LOVENOX) injection  65 mg Subcutaneous Q24H  . fluconazole  50 mg Oral Daily  . gabapentin  300 mg Oral q morning - 10a  . gabapentin  900 mg Oral QHS  . losartan  50 mg Oral Daily  . montelukast  10 mg Oral QHS  . pantoprazole  40 mg Oral Daily  . polyethylene glycol  17 g Oral Daily   . sodium chloride 100 mL/hr at 12/10/18 0427  . piperacillin-tazobactam (ZOSYN)  IV 3.375 g (12/10/18 0436)    Assessment/Plan HTN - home meds - stopped Losartan this AM Depression - home meds Migraine Acute kidney injury - increasing fluid rate and stopping Losartan; Medicine consult  - creatinine 0.85(8/15) >> 1.68(8/17) >> 2.20(8/18) BMI 41.6  Perforated appendicitis with generalized peritonitis S/p laparoscopic appendectomy 8/16 Dr. Georgette Dover - POD#2 - mobilize - Continue diet as tolerated and await return in bowel function. Add colace/miralax. Repeat labs in AM.  AKI - Creatinine up to 1.68. monitor UOP. Change IVF to 0.9% NaCl and increase to 100cc/hr. Repeat BMP in AM  ID - Diflucan 8/17 >> day 2; Zosyn 8/15>> day 4, will need 10 days of antibiotics  FEN - IVF @100cc /hr, HH diet VTE - SCDs, lovenox Foley - none Follow up - [facility]  Plan: Increase IV  fluids, hold blood pressure medicines, Medicine consult for acute kidney injury.  Continue antibiotics.  Mobilize, work on Science writer.  Recheck labs in a.m. we will mobilize later when her blood pressure is better.     LOS: 3 days    Skyler Dusing 12/10/2018 [phone]

## 2018-12-11 DIAGNOSIS — K3580 Unspecified acute appendicitis: Secondary | ICD-10-CM

## 2018-12-11 DIAGNOSIS — I10 Essential (primary) hypertension: Secondary | ICD-10-CM

## 2018-12-11 LAB — COMPREHENSIVE METABOLIC PANEL
ALT: 39 U/L (ref 0–44)
AST: 34 U/L (ref 15–41)
Albumin: 2.8 g/dL — ABNORMAL LOW (ref 3.5–5.0)
Alkaline Phosphatase: 68 U/L (ref 38–126)
Anion gap: 7 (ref 5–15)
BUN: 17 mg/dL (ref 6–20)
CO2: 24 mmol/L (ref 22–32)
Calcium: 8.1 mg/dL — ABNORMAL LOW (ref 8.9–10.3)
Chloride: 105 mmol/L (ref 98–111)
Creatinine, Ser: 1.21 mg/dL — ABNORMAL HIGH (ref 0.44–1.00)
GFR calc Af Amer: >60 mL/min (ref >60)
GFR calc non Af Amer: 52 mL/min — ABNORMAL LOW (ref >60)
Glucose, Bld: 118 mg/dL — ABNORMAL HIGH (ref 70–99)
Potassium: 3.9 mmol/L (ref 3.5–5.1)
Sodium: 136 mmol/L (ref 135–145)
Total Bilirubin: 0.5 mg/dL (ref 0.3–1.2)
Total Protein: 6.2 g/dL — ABNORMAL LOW (ref 6.5–8.1)

## 2018-12-11 LAB — CBC
HCT: 32.6 % — ABNORMAL LOW (ref 36.0–46.0)
Hemoglobin: 9.9 g/dL — ABNORMAL LOW (ref 12.0–15.0)
MCH: 29 pg (ref 26.0–34.0)
MCHC: 30.4 g/dL (ref 30.0–36.0)
MCV: 95.6 fL (ref 80.0–100.0)
Platelets: 299 10*3/uL (ref 150–400)
RBC: 3.41 MIL/uL — ABNORMAL LOW (ref 3.87–5.11)
RDW: 13.8 % (ref 11.5–15.5)
WBC: 11.3 10*3/uL — ABNORMAL HIGH (ref 4.0–10.5)
nRBC: 0 % (ref 0.0–0.2)

## 2018-12-11 MED ORDER — HYDRALAZINE HCL 25 MG PO TABS: 25.0000 mg | ORAL_TABLET | Freq: Four times a day (QID) | ORAL | Status: DC | PRN

## 2018-12-11 MED ORDER — ALBUTEROL SULFATE HFA 108 (90 BASE) MCG/ACT IN AERS: 2.0000 | INHALATION_SPRAY | Freq: Four times a day (QID) | RESPIRATORY_TRACT | Status: DC | PRN

## 2018-12-11 MED ORDER — TIOTROPIUM BROMIDE MONOHYDRATE 1.25 MCG/ACT IN AERS: 2.0000 | INHALATION_SPRAY | Freq: Every day | RESPIRATORY_TRACT | Status: DC

## 2018-12-11 MED ORDER — FLUCONAZOLE 100 MG PO TABS
200.0000 mg | ORAL_TABLET | Freq: Every day | ORAL | Status: AC
  Administered 2018-12-12 – 2018-12-14 (×3): 200 mg via ORAL
  Filled 2018-12-11 (×3): qty 2

## 2018-12-11 MED ORDER — MORPHINE SULFATE (PF) 2 MG/ML IV SOLN
2.0000 mg | INTRAVENOUS | Status: DC | PRN
  Administered 2018-12-11: 2 mg via INTRAVENOUS
  Filled 2018-12-11: qty 1

## 2018-12-11 MED ORDER — ALBUTEROL SULFATE (2.5 MG/3ML) 0.083% IN NEBU: 2.5000 mg | INHALATION_SOLUTION | Freq: Four times a day (QID) | RESPIRATORY_TRACT | Status: DC | PRN

## 2018-12-11 MED ORDER — UMECLIDINIUM BROMIDE 62.5 MCG/INH IN AEPB
1.0000 | INHALATION_SPRAY | Freq: Every day | RESPIRATORY_TRACT | Status: DC
  Administered 2018-12-11 – 2018-12-14 (×4): 1 via RESPIRATORY_TRACT
  Filled 2018-12-11: qty 7

## 2018-12-11 MED ORDER — FLUTICASONE PROPIONATE 50 MCG/ACT NA SUSP
1.0000 | Freq: Two times a day (BID) | NASAL | Status: DC
  Administered 2018-12-11 – 2018-12-13 (×4): 1 via NASAL
  Filled 2018-12-11: qty 16

## 2018-12-11 MED ORDER — LOSARTAN POTASSIUM 25 MG PO TABS
25.0000 mg | ORAL_TABLET | Freq: Every day | ORAL | Status: DC
  Administered 2018-12-11 – 2018-12-14 (×4): 25 mg via ORAL
  Filled 2018-12-11 (×4): qty 1

## 2018-12-11 NOTE — Plan of Care (Signed)
  Problem: Clinical Measurements: Goal: Respiratory complications will improve Outcome: Progressing   Problem: Clinical Measurements: Goal: Cardiovascular complication will be avoided Outcome: Progressing   Problem: Activity: Goal: Risk for activity intolerance will decrease Outcome: Progressing   Problem: Pain Managment: Goal: General experience of comfort will improve Outcome: Progressing   Problem: Safety: Goal: Ability to remain free from injury will improve Outcome: Progressing   Problem: Skin Integrity: Goal: Risk for impaired skin integrity will decrease Outcome: Progressing   Problem: Skin Integrity: Goal: Demonstration of wound healing without infection will improve Outcome: Progressing

## 2018-12-11 NOTE — Plan of Care (Signed)
  Problem: Pain Managment: Goal: General experience of comfort will improve Outcome: Progressing   Problem: Safety: Goal: Ability to remain free from injury will improve Outcome: Progressing   Problem: Skin Integrity: Goal: Demonstration of wound healing without infection will improve Outcome: Progressing   

## 2018-12-11 NOTE — Progress Notes (Signed)
Pt noticeably unsteady while ambulating in the hallway with spouse. Seen drifting backwards and side-stepping but denied dizziness or lightheadedness. Did escort pt back to room while shadowing from behind. Noticed pt lips blue-tinged and face pale. Pt still denies any symptoms. Assisted to sit in recliner and pulse ox noted as 73% on room air. Pt lungs remain clear and generally diminished at bases. Pt once again denies respiratory symptoms. Placed on oxygen at 2L and coached pt through deep breathing. Sats increased to 93%. Respirations were labored at 22-25 per min. HR ranged from 108-121 during this time and pt quickly recovered and states she felt much better while resting on oxygen. Pt reports having previous episodes of SOB and requiring oxygen therapy during hospital admissions. Pt denies wearing oxygen at home. Discouraged independent ambulation without nursing staff. Encouraged to call if any changes in condition.  MD made aware of low pulse ox during ambulation. Will continue oxygen therapy at this time.

## 2018-12-11 NOTE — Progress Notes (Signed)
PROGRESS NOTE    Alyssa Clay  QMV:[id number] DOB: 1966/02/18 DOA: 12/07/2018 PCP: Caren Macadam, MD   Brief Narrative: 53 year old female with history of asthma, depression having 2 weeks of right lower quadrant abdominal pain, found to have acute appendicitis, status post surgery.  Noted to have AKI and medical team was consulted.  Subjective: Seen this morning resting well at the bedside chair, did walk little bit.  No bowel movement.  Voiding well.  Assessment & Plan:  Perforated appendicitis with generalized peritonitis: Status post laparoscopic appendicectomy on 8/16.  Currently on Zosyn, continue plan of care as per surgery.  She seems to be clinically improving.  Remains afebrile.  WBC downtrending. Recent Labs  Lab 12/07/18 1701 12/09/18 0723 12/09/18 1652 12/10/18 0108 12/11/18 0145  WBC 18.1* 16.6* 15.7* 15.0* 11.3*   Acute kidney injury Baseline creatinine 0.8, suspect in the setting of #1/volume depletion/hypotension with blood pressure as low as 78/42/? losartan use.  Creatinine improving nicely with IV fluid hydration, keep IV fluids  going for now.  Renal ultrasound unremarkable.  Follow-up BMP in the morning.  Hypotension has resolved.  Blood pressure is now running on higher side continue PRN hydralazine.  If persistently high could consider resuming losartan at lower dose.  Depression on Cymbalta.  Normocytic anemia: hemoglobin somewhat down trended monitor closely. Recent Labs  Lab 12/07/18 1701 12/09/18 0723 12/09/18 1652 12/10/18 0108 12/11/18 0145  HGB 12.0 9.8* 10.1* 9.8* 9.9*  HCT 37.7 31.6* 32.6* 32.0* 32.6*    Body mass index is 47.69 kg/m.    DVT prophylaxis: lovenox Code Status: FULL Family Communication: plan of care discussed with patient in detail Disposition Plan: Remains inpatient pending clinical improvement.  Disposition as per surgery medical team will continue to follow.  Consultants:  TRH  Procedures:  Appendicectomy Microbiology: NONE Antimicrobials: Anti-infectives (From admission, onward)   Start     Dose/Rate Route Frequency Ordered Stop   12/12/18 1000  fluconazole (DIFLUCAN) tablet 200 mg     200 mg Oral Daily 12/11/18 1431     12/09/18 1330  fluconazole (DIFLUCAN) tablet 50 mg  Status:  Discontinued     50 mg Oral Daily 12/09/18 1328 12/11/18 1431   12/08/18 1330  piperacillin-tazobactam (ZOSYN) IVPB 3.375 g     3.375 g 12.5 mL/hr over 240 Minutes Intravenous Every 8 hours 12/08/18 1116     12/08/18 0400  piperacillin-tazobactam (ZOSYN) IVPB 3.375 g  Status:  Discontinued     3.375 g 12.5 mL/hr over 240 Minutes Intravenous Every 8 hours 12/07/18 2345 12/08/18 1116   12/07/18 2215  piperacillin-tazobactam (ZOSYN) IVPB 3.375 g     3.375 g 100 mL/hr over 30 Minutes Intravenous  Once 12/07/18 2211 12/07/18 2253       Objective: Vitals:   12/11/18 0850 12/11/18 1000 12/11/18 1045 12/11/18 1421  BP: (!) 167/93 (!) 169/89  (!) 167/97  Pulse: (!) 111 (!) 115  (!) 113  Resp: 18 20  16   Temp: 98.7 F (37.1 C) 98.9 F (37.2 C)  98.8 F (37.1 C)  TempSrc: Oral Oral  Oral  SpO2:  (!) 82% 93% 94%  Weight:      Height:        Intake/Output Summary (Last 24 hours) at 12/11/2018 1443 Last data filed at 12/10/2018 1527 Gross per 24 hour  Intake 1496.51 ml  Output -  Net 1496.51 ml   Filed Weights   12/07/18 2352  Weight: 130 kg   Weight change:  Body mass index is 47.69 kg/m.  Intake/Output from previous day: 08/18 0701 - 08/19 0700 In: 1496.5 [I.V.:1428.2; IV Piggyback:68.4] Out: -  Intake/Output this shift: No intake/output data recorded.  Examination:  General exam: Appears calm and comfortable,Not in distress, older fore the age HEENT:PERRL,Oral mucosa moist, Ear/Nose normal on gross exam Respiratory system: Bilateral equal air entry, normal vesicular breath sounds, no wheezes or crackles  Cardiovascular system: S1 & S2 heard,No JVD, murmurs.  Gastrointestinal system: Abdomen is  soft, non tender, non distended, BS +  Nervous System:Alert and oriented. No focal neurological deficits/moving extremities, sensation intact. Extremities: No edema, no clubbing, distal peripheral pulses palpable. Skin: No rashes, lesions, no icterus MSK: Normal muscle bulk,tone ,power  Medications:  Scheduled Meds: . docusate sodium  100 mg Oral BID  . DULoxetine  60 mg Oral Daily  . enoxaparin (LOVENOX) injection  65 mg Subcutaneous Q24H  . [START ON 12/12/2018] fluconazole  200 mg Oral Daily  . fluticasone  1 spray Each Nare BID  . gabapentin  300 mg Oral q morning - 10a  . gabapentin  900 mg Oral QHS  . losartan  25 mg Oral Daily  . montelukast  10 mg Oral QHS  . pantoprazole  40 mg Oral Daily  . polyethylene glycol  17 g Oral Daily  . umeclidinium bromide  1 puff Inhalation Daily   Continuous Infusions: . sodium chloride 50 mL/hr at 12/11/18 0851  . piperacillin-tazobactam (ZOSYN)  IV 3.375 g (12/11/18 1417)    Data Reviewed: I have personally reviewed following labs and imaging studies  CBC: Recent Labs  Lab 12/07/18 1701 12/09/18 0723 12/09/18 1652 12/10/18 0108 12/11/18 0145  WBC 18.1* 16.6* 15.7* 15.0* 11.3*  HGB 12.0 9.8* 10.1* 9.8* 9.9*  HCT 37.7 31.6* 32.6* 32.0* 32.6*  MCV 92.2 94.0 93.4 94.7 95.6  PLT 351 273 295 306 902   Basic Metabolic Panel: Recent Labs  Lab 12/07/18 1701 12/09/18 0723 12/10/18 0108 12/11/18 0145  NA 138 137 133* 136  K 3.9 4.5 3.7 3.9  CL 103 102 101 105  CO2 24 27 22 24   GLUCOSE 124* 121* 122* 118*  BUN 11 18 27* 17  CREATININE 0.85 1.68* 2.20* 1.21*  CALCIUM 9.1 8.3* 8.0* 8.1*   GFR: Estimated Creatinine Clearance: 75.7 mL/min (A) (by C-G formula based on SCr of 1.21 mg/dL (H)). Liver Function Tests: Recent Labs  Lab 12/07/18 1701 12/11/18 0145  AST 16 34  ALT 17 39  ALKPHOS 61 68  BILITOT 0.3 0.5  PROT 7.0 6.2*  ALBUMIN 3.8 2.8*   Recent Labs  Lab 12/07/18 1701   LIPASE 22   No results for input(s): AMMONIA in the last 168 hours. Coagulation Profile: No results for input(s): INR, PROTIME in the last 168 hours. Cardiac Enzymes: No results for input(s): CKTOTAL, CKMB, CKMBINDEX, TROPONINI in the last 168 hours. BNP (last 3 results) No results for input(s): PROBNP in the last 8760 hours. HbA1C: No results for input(s): HGBA1C in the last 72 hours. CBG: No results for input(s): GLUCAP in the last 168 hours. Lipid Profile: No results for input(s): CHOL, HDL, LDLCALC, TRIG, CHOLHDL, LDLDIRECT in the last 72 hours. Thyroid Function Tests: No results for input(s): TSH, T4TOTAL, FREET4, T3FREE, THYROIDAB in the last 72 hours. Anemia Panel: No results for input(s): VITAMINB12, FOLATE, FERRITIN, TIBC, IRON, RETICCTPCT in the last 72 hours. Sepsis Labs: No results for input(s): PROCALCITON, LATICACIDVEN in the last 168 hours.  Recent Results (from the past 240 hour(s))  SARS Coronavirus 2 [facility] order, Performed in [facility] hospital lab) Nasopharyngeal Nasopharyngeal Swab     Status: None   Collection Time: 12/07/18  8:56 PM   Specimen: Nasopharyngeal Swab  Result Value Ref Range Status   SARS Coronavirus 2 NEGATIVE NEGATIVE Final    Comment: (NOTE) If result is NEGATIVE SARS-CoV-2 target nucleic acids are NOT DETECTED. The SARS-CoV-2 RNA is generally detectable in upper and lower  respiratory specimens during the acute phase of infection. The lowest  concentration of SARS-CoV-2 viral copies this assay can detect is 250  copies / mL. A negative result does not preclude SARS-CoV-2 infection  and should not be used as the sole basis for treatment or other  patient management decisions.  A negative result may occur with  improper specimen collection / handling, submission of specimen other  than nasopharyngeal swab, presence of viral mutation(s) within the  areas targeted by this assay, and inadequate number of viral copies  (<250 copies /  mL). A negative result must be combined with clinical  observations, patient history, and epidemiological information. If result is POSITIVE SARS-CoV-2 target nucleic acids are DETECTED. The SARS-CoV-2 RNA is generally detectable in upper and lower  respiratory specimens dur ing the acute phase of infection.  Positive  results are indicative of active infection with SARS-CoV-2.  Clinical  correlation with patient history and other diagnostic information is  necessary to determine patient infection status.  Positive results do  not rule out bacterial infection or co-infection with other viruses. If result is PRESUMPTIVE POSTIVE SARS-CoV-2 nucleic acids MAY BE PRESENT.   A presumptive positive result was obtained on the submitted specimen  and confirmed on repeat testing.  While 2019 novel coronavirus  (SARS-CoV-2) nucleic acids may be present in the submitted sample  additional confirmatory testing may be necessary for epidemiological  and / or clinical management purposes  to differentiate between  SARS-CoV-2 and other Sarbecovirus currently known to infect humans.  If clinically indicated additional testing with an alternate test  methodology [phone]) is advised. The SARS-CoV-2 RNA is generally  detectable in upper and lower respiratory sp ecimens during the acute  phase of infection. The expected result is Negative. Fact Sheet for Patients:  StrictlyIdeas.no Fact Sheet for Healthcare Providers: BankingDealers.co.za This test is not yet approved or cleared by the [place] FDA and has been authorized for detection and/or diagnosis of SARS-CoV-2 by FDA under an Emergency Use Authorization (EUA).  This EUA will remain in effect (meaning this test can be used) for the duration of the COVID-19 declaration under Section 564(b)(1) of the Act, 21 U.S.C. section 360bbb-3(b)(1), unless the authorization is terminated or revoked sooner.  Performed at [facility], [place] [address], [place], [place] [address]   Surgical pcr screen     Status: None   Collection Time: 12/08/18 12:25 AM   Specimen: Nasal Mucosa; Nasal Swab  Result Value Ref Range Status   MRSA, PCR NEGATIVE NEGATIVE Final   Staphylococcus aureus NEGATIVE NEGATIVE Final    Comment: (NOTE) The Xpert SA Assay (FDA approved for NASAL specimens in patients 62 years of age and older), is one component of a comprehensive surveillance program. It is not intended to diagnose infection nor to guide or monitor treatment. Performed at [facility], [place] [address], [place], [place] [address]       Radiology Studies: US Renal  Result Date: 12/10/2018 CLINICAL DATA:  Acute kidney injury EXAM: RENAL / URINARY TRACT ULTRASOUND COMPLETE  COMPARISON:  None. FINDINGS: Right Kidney: Renal measurements: 11.8 x 5.6 x 5.7 cm = volume: 195 mL . Echogenicity within normal limits. No mass or hydronephrosis visualized. Left Kidney: Renal measurements: 12.3 x 6.8 x 6.5 cm = volume: 284 mL. Echogenicity within normal limits. No mass or hydronephrosis visualized. Bladder: Appears normal for degree of bladder distention. IMPRESSION: Normal study. Electronically Signed   By: Rolm Baptise M.D.   On: 12/10/2018 10:42      LOS: 4 days   Time spent: More than 50% of that time was spent in counseling and/or coordination of care.  Antonieta Pert, MD [facility]  12/11/2018, 2:43 PM

## 2018-12-11 NOTE — Progress Notes (Signed)
Pt BP elevated but remains asymptomatic. P.A notified during rounds and given new orders to reduce IVF. Did so per orders and will f/u BP.

## 2018-12-11 NOTE — Plan of Care (Signed)
  Problem: Education: Goal: Knowledge of General Education information will improve Description: Including pain rating scale, medication(s)/side effects and non-pharmacologic comfort measures Outcome: Progressing   Problem: Clinical Measurements: Goal: Ability to maintain clinical measurements within normal limits will improve Outcome: Progressing   

## 2018-12-11 NOTE — Progress Notes (Signed)
Pt maintaing SBP in the 160s. Denies any symptoms. Reported to covering provider for CCS. New orders given. Pt and spouse aware.

## 2018-12-11 NOTE — Progress Notes (Addendum)
3 Days Post-Op    CC: Abdominal pain  Subjective: She looks fine this morning.  Still on oxygen, I restarted her inhalers, she also has a nebulizer which she says she uses in the winter as needed.  Her port sites all look fine.  She has not had a bowel movement yet.  She has not been up and walking  Objective: Vital signs in last 24 hours: Temp:  [98.3 F (36.8 C)-98.8 F (37.1 C)] 98.4 F (36.9 C) (08/19 0552) Pulse Rate:  [106-111] 109 (08/19 0552) Resp:  [16-20] 18 (08/19 0552) BP: (127-155)/(64-83) 143/80 (08/19 0552) SpO2:  [93 %-95 %] 93 % (08/19 0552) Last BM Date: 12/07/18 1496 IV recorded Nothing PO recorded NO urine or BM recorded Afebrile, VSS BP up today and still on 1-2 liter/[place] with saturations of 93-95%    Intake/Output from previous day: 08/18 0701 - 08/19 0700 In: 1496.5 [I.V.:1428.2; IV Piggyback:68.4] Out: -  Intake/Output this shift: No intake/output data recorded.  General appearance: alert, cooperative and no distress Resp: clear to auscultation bilaterally GI: Soft, normal postop soreness.  Port sites all look fine.  Lab Results:  Recent Labs    12/10/18 0108 12/11/18 0145  WBC 15.0* 11.3*  HGB 9.8* 9.9*  HCT 32.0* 32.6*  PLT 306 299    BMET Recent Labs    12/10/18 0108 12/11/18 0145  NA 133* 136  K 3.7 3.9  CL 101 105  CO2 22 24  GLUCOSE 122* 118*  BUN 27* 17  CREATININE 2.20* 1.21*  CALCIUM 8.0* 8.1*   PT/INR No results for input(s): LABPROT, INR in the last 72 hours.  Recent Labs  Lab 12/07/18 1701 12/11/18 0145  AST 16 34  ALT 17 39  ALKPHOS 61 68  BILITOT 0.3 0.5  PROT 7.0 6.2*  ALBUMIN 3.8 2.8*     Lipase     Component Value Date/Time   LIPASE 22 12/07/2018 1701     Medications: . docusate sodium  100 mg Oral BID  . DULoxetine  60 mg Oral Daily  . enoxaparin (LOVENOX) injection  65 mg Subcutaneous Q24H  . fluconazole  50 mg Oral Daily  . gabapentin  300 mg Oral q morning - 10a  . gabapentin  900 mg  Oral QHS  . montelukast  10 mg Oral QHS  . pantoprazole  40 mg Oral Daily  . polyethylene glycol  17 g Oral Daily    Assessment/Plan  HTN - home meds - stopped Losartan this AM (BP is up again) Depression - home meds Migraine Acute kidney injury - increasing fluid rate and stopping Losartan; Medicine consult  - creatinine 0.85(8/15) >> 1.68(8/17) >> 2.20(8/18) >>1.21(8/19) BMI 41.6 Hx of asthma - very concerned about restarting these medicines  Perforated appendicitis with generalized peritonitis S/p laparoscopic appendectomy 8/16 Dr. Georgette Dover - POD#3 - mobilize - Continue diet as tolerated and await return in bowel function. Add colace/miralax. Repeat labs in AM.    ID -Diflucan 8/17 >> day 3; Zosyn 8/15>> day 5, will need 10 days of antibiotics FEN -IVF @125cc /hr, HH diet VTE -SCDs, lovenox Foley -none Follow up -[facility]  Plan: We will mobilize her, continue antibiotics/Diflucan.  I have asked the nurses to wean her off her oxygen after her nebulizers have restarted.  She is on MiraLAX and has had 2 doses. I have decreased the IV fluids to 50 ml/hr. Watch BP and defer to Medicine for restarting BP medicines from home.  Will also work to get  her off the IV morphine. Hopefully home next 24 to 48 hours.  Medicine has told nursing staff we can decide on BP meds so I will restart Cozaar and 1/2 dose.(25 mg QD)   LOS: 4 days    Andrea Colglazier 12/11/2018 [phone]

## 2018-12-11 NOTE — TOC Initial Note (Signed)
Transition of Care [facility]) - Initial/Assessment Note    Patient Details  Name: Alyssa Clay MRN: [id number] Date of Birth: 17-Apr-1966  [facility]) CM/SW Contact:    Marilu Favre, RN Phone Number: 12/11/2018, 2:00 PM  Clinical Narrative:                 From home with husband , will continue to follow.  Expected Discharge Plan: Home/Self Care     Patient Goals and CMS Choice Patient states their goals for this hospitalization and ongoing recovery are:: to go home CMS Medicare.gov Compare Post Acute Care list provided to:: Patient Choice offered to / list presented to : NA  Expected Discharge Plan and Services Expected Discharge Plan: Home/Self Care       Living arrangements for the past 2 months: Single Family Home                 DME Arranged: N/A         HH Arranged: NA          Prior Living Arrangements/Services Living arrangements for the past 2 months: Single Family Home Lives with:: Spouse Patient language and need for interpreter reviewed:: Yes        Need for Family Participation in Patient Care: Yes (Comment) Care giver support system in place?: Yes (comment)   Criminal Activity/Legal Involvement Pertinent to Current Situation/Hospitalization: No - Comment as needed  Activities of Daily Living Home Assistive Devices/Equipment: None ADL Screening (condition at time of admission) Patient's cognitive ability adequate to safely complete daily activities?: Yes Is the patient deaf or have difficulty hearing?: No Does the patient have difficulty seeing, even when wearing glasses/contacts?: No Does the patient have difficulty concentrating, remembering, or making decisions?: No Patient able to express need for assistance with ADLs?: Yes Does the patient have difficulty dressing or bathing?: No Independently performs ADLs?: Yes (appropriate for developmental age) Does the patient have difficulty walking or climbing stairs?:  No Weakness of Legs: None Weakness of Arms/Hands: None  Permission Sought/Granted   Permission granted to share information with : No              Emotional Assessment              Admission diagnosis:  abd pain Patient Active Problem List   Diagnosis Date Noted  . Acute appendicitis 12/07/2018  . Primary osteoarthritis of both hands 07/10/2018  . Primary osteoarthritis of both knees 07/10/2018  . Not well controlled severe persistent asthma 04/22/2018  . Hypertension 03/27/2018  . 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 Moderate persistent asthma 01/07/2018  . Chronic rhinitis 01/07/2018  . Gastroesophageal reflux disease 01/07/2018   PCP:  Caren Macadam, MD Pharmacy:   [id number] - [place], [place] [address] [place] [address] Phone: [phone] Fax: [phone]  [place], [place] [address] [place] [address] Phone: [phone] Fax: [phone]     Social Determinants of Health (SDOH) Interventions    Readmission Risk Interventions No flowsheet data found.

## 2018-12-12 ENCOUNTER — Inpatient Hospital Stay (HOSPITAL_COMMUNITY): Payer: 59

## 2018-12-12 DIAGNOSIS — R0902 Hypoxemia: Secondary | ICD-10-CM

## 2018-12-12 LAB — CBC
HCT: 32 % — ABNORMAL LOW (ref 36.0–46.0)
Hemoglobin: 9.7 g/dL — ABNORMAL LOW (ref 12.0–15.0)
MCH: 28.6 pg (ref 26.0–34.0)
MCHC: 30.3 g/dL (ref 30.0–36.0)
MCV: 94.4 fL (ref 80.0–100.0)
Platelets: 330 10*3/uL (ref 150–400)
RBC: 3.39 MIL/uL — ABNORMAL LOW (ref 3.87–5.11)
RDW: 13.5 % (ref 11.5–15.5)
WBC: 11.5 10*3/uL — ABNORMAL HIGH (ref 4.0–10.5)
nRBC: 0 % (ref 0.0–0.2)

## 2018-12-12 LAB — BASIC METABOLIC PANEL
Anion gap: 9 (ref 5–15)
BUN: 7 mg/dL (ref 6–20)
CO2: 23 mmol/L (ref 22–32)
Calcium: 8.3 mg/dL — ABNORMAL LOW (ref 8.9–10.3)
Chloride: 104 mmol/L (ref 98–111)
Creatinine, Ser: 0.88 mg/dL (ref 0.44–1.00)
GFR calc Af Amer: >60 mL/min (ref >60)
GFR calc non Af Amer: >60 mL/min (ref >60)
Glucose, Bld: 109 mg/dL — ABNORMAL HIGH (ref 70–99)
Potassium: 3.7 mmol/L (ref 3.5–5.1)
Sodium: 136 mmol/L (ref 135–145)

## 2018-12-12 IMAGING — DX CHEST - 2 VIEW
3 series · 3 of 3 positions shown · non-contrast
Comparison: [DATE]

CLINICAL DATA: Shortness of breath

EXAM:
CHEST - 2 VIEW

[x chest ap]
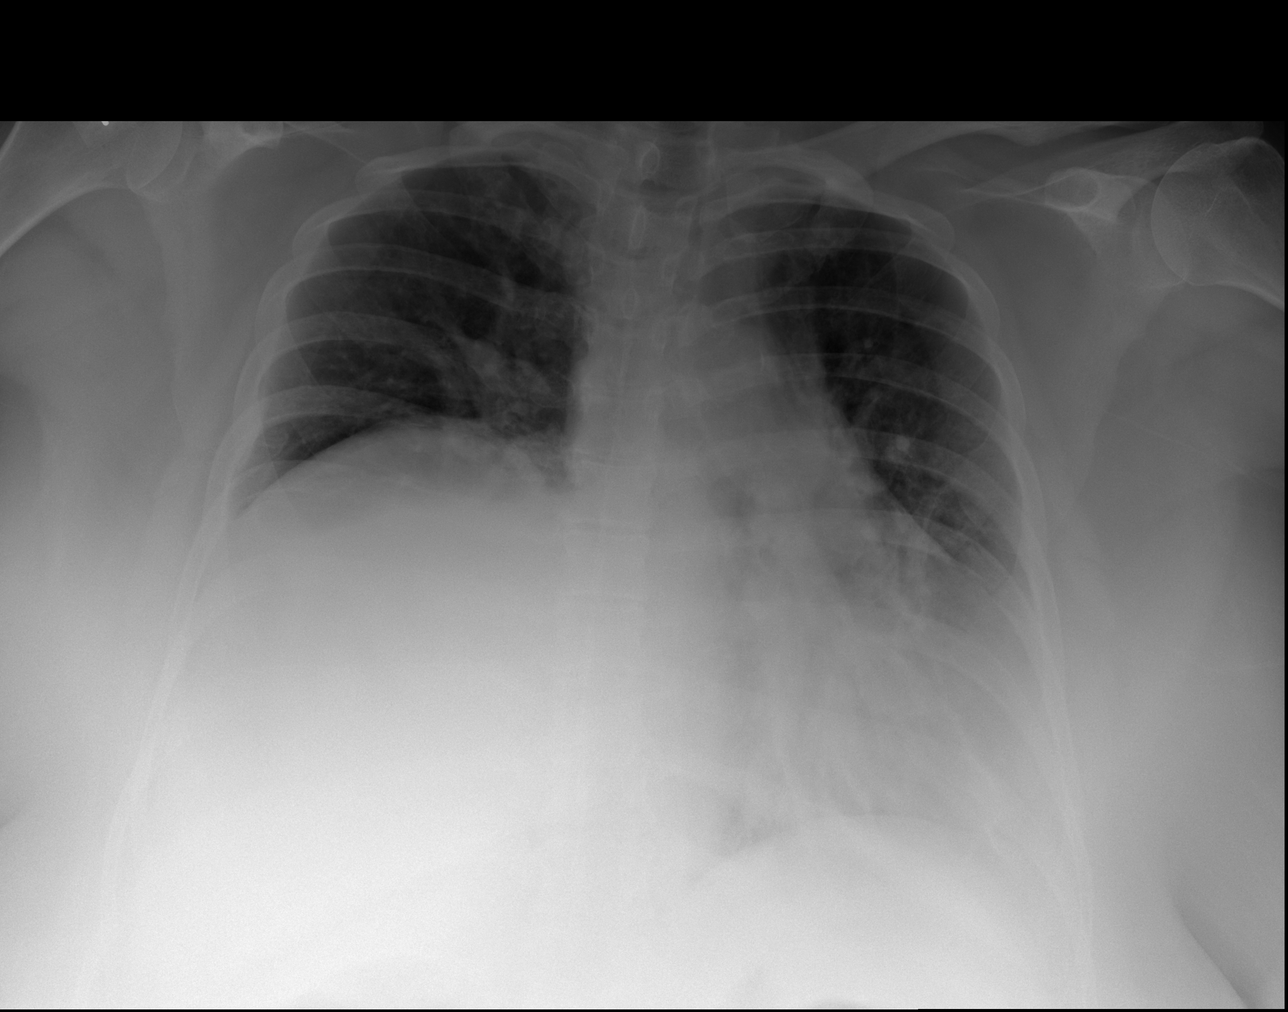

[w chest lat (1 of 2)]
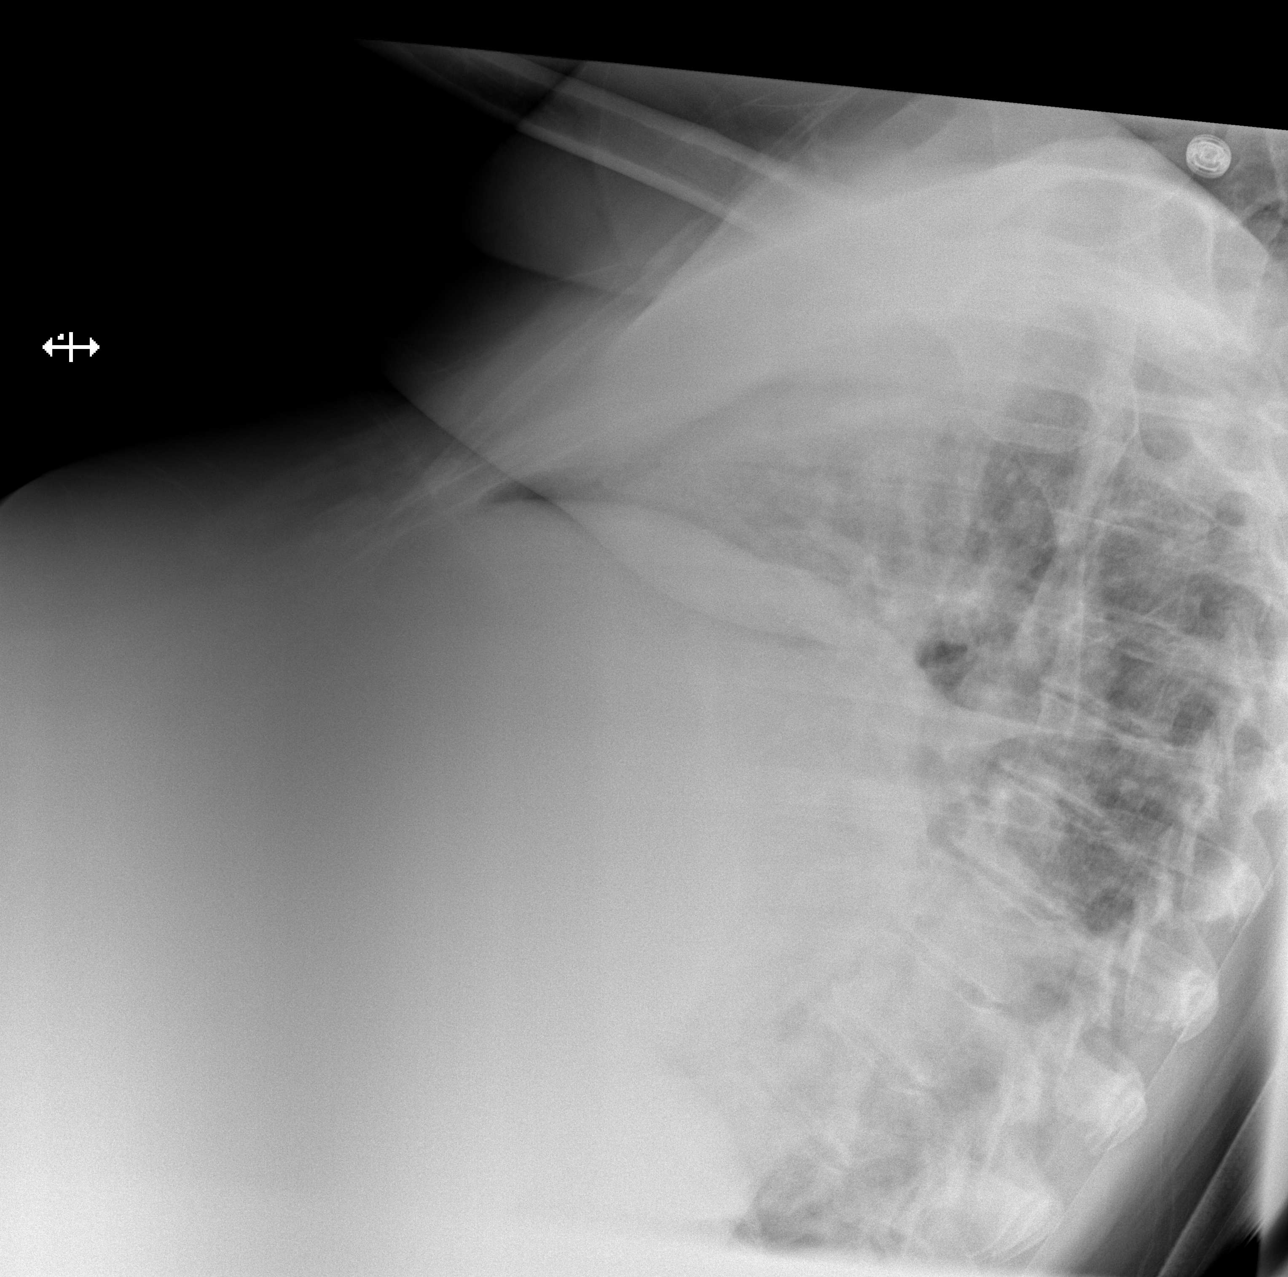

[w chest lat (2 of 2)]
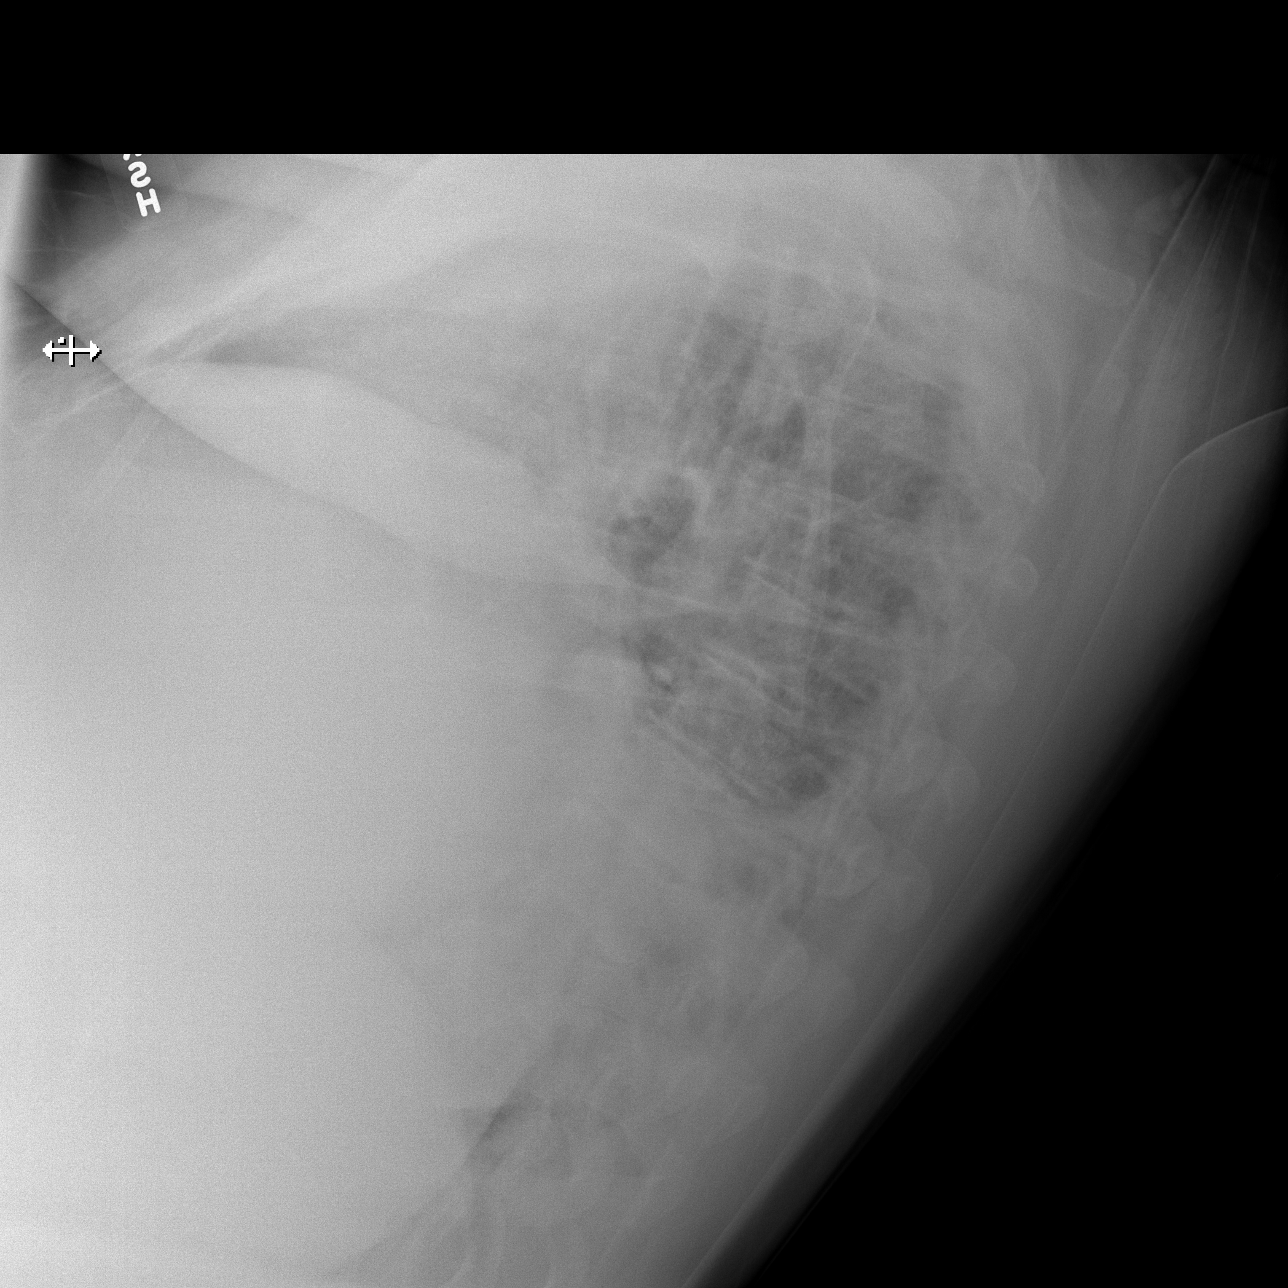

[3 of 3 positions shown; findings below may reference images not displayed]

FINDINGS: There is stable elevation of the right hemidiaphragm with right base
atelectasis. There is no consolidation or appreciable edema. Heart
is mildly enlarged with pulmonary venous hypertension. No
adenopathy. No bone lesions.
IMPRESSION: There is a degree of pulmonary vascular congestion without edema or
consolidation. Stable elevation of the right hemidiaphragm with
right base atelectasis.

## 2018-12-12 MED ORDER — FLEET ENEMA 7-19 GM/118ML RE ENEM: 1.0000 | ENEMA | Freq: Every day | RECTAL | Status: DC | PRN

## 2018-12-12 MED ORDER — FUROSEMIDE 10 MG/ML IJ SOLN
40.0000 mg | Freq: Once | INTRAMUSCULAR | Status: AC
  Administered 2018-12-12: 40 mg via INTRAVENOUS
  Filled 2018-12-12: qty 4

## 2018-12-12 MED ORDER — BISACODYL 10 MG RE SUPP
10.0000 mg | Freq: Once | RECTAL | Status: AC
  Administered 2018-12-12: 10 mg via RECTAL
  Filled 2018-12-12: qty 1

## 2018-12-12 NOTE — Progress Notes (Addendum)
PROGRESS NOTE    Alyssa Clay  JOA:[id number] DOB: 05-28-1965 DOA: 12/07/2018 PCP: Caren Macadam, MD   Brief Narrative: 53 year old female with history of asthma, depression having 2 weeks of right lower quadrant abdominal pain, found to have acute appendicitis, status post surgery.  Noted to have AKI and medical team was consulted.  Subjective: Patient states that she had a large bowel movement this morning and feels more comfortable.  Denies any shortness of breath or chest pain per se.  But her oxygen levels have been running low.  She has been requiring 2 L of oxygen by nasal cannula.  Does not use oxygen at home.    Assessment & Plan:  Hypoxia Patient does not have increased respiratory effort.  She has chronic right hemidiaphragm paralysis.  She is morbidly obese.  She has also received a lot of fluids during this hospitalization.  Reason for her hypoxia is most likely poor effort from abdominal surgery.  X-ray done this morning does not show any consolidation or pulmonary edema.  We will check her weight and if she has indeed gained a lot of weight we will give her a dose of furosemide.  Incentive spirometry was emphasized.  Since she otherwise is asymptomatic there is very low suspicion for venous thromboembolism.  She is on DVT prophylaxis.  Acute kidney injury  Baseline creatinine 0.8.  Patient is creatinine peaked at 2.2 on 8/18.  Patient was hypotensive on 8/17.  She received contrast for CT scan on 8/15. ATN is a distinct possibility. Acute renal failure likely hemodynamically mediated.  She was also on losartan.    Patient was given IV fluids.  Renal function is now back to normal.  She was started back on her losartan at a lower dose yesterday.  Monitor urine output.  Renal ultrasound was unremarkable.  Perforated appendicitis with generalized peritonitis:  Status post laparoscopic appendicectomy on 8/16.  Management per general surgery.    Hypotension Blood  pressures now in the hypertensive range.  Started back on her losartan.  Hydralazine as needed.    Depression Cymbalta.  Normocytic anemia:  Hemoglobin is low but stable.  No evidence of overt bleeding.  Continue to monitor.  Morbid obesity Body mass index is 47.69 kg/m.    DVT prophylaxis: lovenox   Procedures: Appendicectomy  Microbiology: NONE  Antimicrobials: Anti-infectives (From admission, onward)   Start     Dose/Rate Route Frequency Ordered Stop   12/12/18 1000  fluconazole (DIFLUCAN) tablet 200 mg     200 mg Oral Daily 12/11/18 1431     12/09/18 1330  fluconazole (DIFLUCAN) tablet 50 mg  Status:  Discontinued     50 mg Oral Daily 12/09/18 1328 12/11/18 1431   12/08/18 1330  piperacillin-tazobactam (ZOSYN) IVPB 3.375 g     3.375 g 12.5 mL/hr over 240 Minutes Intravenous Every 8 hours 12/08/18 1116     12/08/18 0400  piperacillin-tazobactam (ZOSYN) IVPB 3.375 g  Status:  Discontinued     3.375 g 12.5 mL/hr over 240 Minutes Intravenous Every 8 hours 12/07/18 2345 12/08/18 1116   12/07/18 2215  piperacillin-tazobactam (ZOSYN) IVPB 3.375 g     3.375 g 100 mL/hr over 30 Minutes Intravenous  Once 12/07/18 2211 12/07/18 2253       Objective: Vitals:   12/12/18 0114 12/12/18 0524 12/12/18 0901 12/12/18 1107  BP: 122/61 136/69  137/69  Pulse: (!) 103 (!) 101  90  Resp: 18 18  18   Temp: 99.2 F (37.3 C)  98.8 F (37.1 C)  97.8 F (36.6 C)  TempSrc: Oral Oral  Oral  SpO2: 95% 94% 93% 98%  Weight:      Height:        Intake/Output Summary (Last 24 hours) at 12/12/2018 1130 Last data filed at 12/12/2018 0900 Gross per 24 hour  Intake 220 ml  Output -  Net 220 ml   Filed Weights   12/07/18 2352  Weight: 130 kg   Weight change:   Body mass index is 47.69 kg/m.  Intake/Output from previous day: 08/19 0701 - 08/20 0700 In: 100 [IV Piggyback:100] Out: -  Intake/Output this shift: Total I/O In: 120 [P.O.:120] Out: -   Examination:  General  appearance: Awake alert.  In no distress.  Morbidly obese Resp: Normal effort.  Diminished air entry at the bases but no crackles or wheezing heard.  No rhonchi. Cardio: S1-S2 is normal regular.  No S3-S4.  No rubs murmurs or bruit GI: Abdomen is soft.  Nontender nondistended.  Bowel sounds are present normal.  No masses organomegaly Extremities: No edema.  Full range of motion of lower extremities. Neurologic: Alert and oriented x3.  No focal neurological deficits.    Medications:  Scheduled Meds: . docusate sodium  100 mg Oral BID  . DULoxetine  60 mg Oral Daily  . enoxaparin (LOVENOX) injection  65 mg Subcutaneous Q24H  . fluconazole  200 mg Oral Daily  . fluticasone  1 spray Each Nare BID  . gabapentin  300 mg Oral q morning - 10a  . gabapentin  900 mg Oral QHS  . losartan  25 mg Oral Daily  . montelukast  10 mg Oral QHS  . pantoprazole  40 mg Oral Daily  . polyethylene glycol  17 g Oral Daily  . umeclidinium bromide  1 puff Inhalation Daily   Continuous Infusions: . piperacillin-tazobactam (ZOSYN)  IV 3.375 g (12/12/18 0520)    Data Reviewed: I have personally reviewed following labs and imaging studies  CBC: Recent Labs  Lab 12/09/18 0723 12/09/18 1652 12/10/18 0108 12/11/18 0145 12/12/18 0312  WBC 16.6* 15.7* 15.0* 11.3* 11.5*  HGB 9.8* 10.1* 9.8* 9.9* 9.7*  HCT 31.6* 32.6* 32.0* 32.6* 32.0*  MCV 94.0 93.4 94.7 95.6 94.4  PLT 273 295 306 299 659   Basic Metabolic Panel: Recent Labs  Lab 12/07/18 1701 12/09/18 0723 12/10/18 0108 12/11/18 0145 12/12/18 0312  NA 138 137 133* 136 136  K 3.9 4.5 3.7 3.9 3.7  CL 103 102 101 105 104  CO2 24 27 22 24 23   GLUCOSE 124* 121* 122* 118* 109*  BUN 11 18 27* 17 7  CREATININE 0.85 1.68* 2.20* 1.21* 0.88  CALCIUM 9.1 8.3* 8.0* 8.1* 8.3*   GFR: Estimated Creatinine Clearance: 104.1 mL/min (by C-G formula based on SCr of 0.88 mg/dL). Liver Function Tests: Recent Labs  Lab 12/07/18 1701 12/11/18 0145  AST 16 34   ALT 17 39  ALKPHOS 61 68  BILITOT 0.3 0.5  PROT 7.0 6.2*  ALBUMIN 3.8 2.8*   Recent Labs  Lab 12/07/18 1701  LIPASE 22     Recent Results (from the past 240 hour(s))  SARS Coronavirus 2 [facility] order, Performed in [facility] hospital lab) Nasopharyngeal Nasopharyngeal Swab     Status: None   Collection Time: 12/07/18  8:56 PM   Specimen: Nasopharyngeal Swab  Result Value Ref Range Status   SARS Coronavirus 2 NEGATIVE NEGATIVE Final    Comment: (NOTE) If result is NEGATIVE SARS-CoV-2  target nucleic acids are NOT DETECTED. The SARS-CoV-2 RNA is generally detectable in upper and lower  respiratory specimens during the acute phase of infection. The lowest  concentration of SARS-CoV-2 viral copies this assay can detect is 250  copies / mL. A negative result does not preclude SARS-CoV-2 infection  and should not be used as the sole basis for treatment or other  patient management decisions.  A negative result may occur with  improper specimen collection / handling, submission of specimen other  than nasopharyngeal swab, presence of viral mutation(s) within the  areas targeted by this assay, and inadequate number of viral copies  (<250 copies / mL). A negative result must be combined with clinical  observations, patient history, and epidemiological information. If result is POSITIVE SARS-CoV-2 target nucleic acids are DETECTED. The SARS-CoV-2 RNA is generally detectable in upper and lower  respiratory specimens dur ing the acute phase of infection.  Positive  results are indicative of active infection with SARS-CoV-2.  Clinical  correlation with patient history and other diagnostic information is  necessary to determine patient infection status.  Positive results do  not rule out bacterial infection or co-infection with other viruses. If result is PRESUMPTIVE POSTIVE SARS-CoV-2 nucleic acids MAY BE PRESENT.   A presumptive positive result was obtained on the submitted  specimen  and confirmed on repeat testing.  While 2019 novel coronavirus  (SARS-CoV-2) nucleic acids may be present in the submitted sample  additional confirmatory testing may be necessary for epidemiological  and / or clinical management purposes  to differentiate between  SARS-CoV-2 and other Sarbecovirus currently known to infect humans.  If clinically indicated additional testing with an alternate test  methodology [phone]) is advised. The SARS-CoV-2 RNA is generally  detectable in upper and lower respiratory sp ecimens during the acute  phase of infection. The expected result is Negative. Fact Sheet for Patients:  StrictlyIdeas.no Fact Sheet for Healthcare Providers: BankingDealers.co.za This test is not yet approved or cleared by the [place] FDA and has been authorized for detection and/or diagnosis of SARS-CoV-2 by FDA under an Emergency Use Authorization (EUA).  This EUA will remain in effect (meaning this test can be used) for the duration of the COVID-19 declaration under Section 564(b)(1) of the Act, 21 U.S.C. section 360bbb-3(b)(1), unless the authorization is terminated or revoked sooner. Performed at [facility], [place] [address], [place], [place] [address]   Surgical pcr screen     Status: None   Collection Time: 12/08/18 12:25 AM   Specimen: Nasal Mucosa; Nasal Swab  Result Value Ref Range Status   MRSA, PCR NEGATIVE NEGATIVE Final   Staphylococcus aureus NEGATIVE NEGATIVE Final    Comment: (NOTE) The Xpert SA Assay (FDA approved for NASAL specimens in patients 105 years of age and older), is one component of a comprehensive surveillance program. It is not intended to diagnose infection nor to guide or monitor treatment. Performed at [facility], [place] [address] [place], [place] [address]       Radiology Studies: Dg Chest 2 View  Result Date: 12/12/2018 CLINICAL DATA:  Shortness of breath  EXAM: CHEST - 2 VIEW COMPARISON:  May 17, 2018 FINDINGS: There is stable elevation of the right hemidiaphragm with right base atelectasis. There is no consolidation or appreciable edema. Heart is mildly enlarged with pulmonary venous hypertension. No adenopathy. No bone lesions. IMPRESSION: There is a degree of pulmonary vascular congestion without edema or consolidation. Stable elevation of the right hemidiaphragm  with right base atelectasis. Electronically Signed   By: Lowella Grip III M.D.   On: 12/12/2018 09:31      LOS: 5 days    Bonnielee Haff, MD [facility]  12/12/2018, 11:30 AM

## 2018-12-12 NOTE — Plan of Care (Signed)
  Problem: Clinical Measurements: Goal: Postoperative complications will be avoided or minimized Outcome: Progressing   Problem: Skin Integrity: Goal: Demonstration of wound healing without infection will improve Outcome: Progressing   

## 2018-12-12 NOTE — Progress Notes (Signed)
4 Days Post-Op    CC: Abdominal pain  Subjective: Patient complains of abdominal pain which is primarily on the right side today.  She has not had a bowel movement despite being on MiraLAX.  She remains on oxygen.  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  She was tested for sleep apnea about 10 years ago, this was negative but she is put on at least 25 pounds since that time.  Objective: Vital signs in last 24 hours: Temp:  [98.1 F (36.7 C)-99.2 F (37.3 C)] 98.8 F (37.1 C) (08/20 0524) Pulse Rate:  [101-115] 101 (08/20 0524) Resp:  [15-20] 18 (08/20 0524) BP: (122-169)/(61-97) 136/69 (08/20 0524) SpO2:  [82 %-97 %] 94 % (08/20 0524) Last BM Date: 12/07/18 PO not recorded Urine x 2 recorded Afebrile, ongoing mild tachycardia, BP better Still requiring O2, sats dropped to 79% walking without O2 yesterday Creatinine stable and down to 0.88.   Intake/Output from previous day: 08/19 0701 - 08/20 0700 In: 100 [IV Piggyback:100] Out: -  Intake/Output this shift: No intake/output data recorded.  General appearance: alert, cooperative and no distress Resp: clear to auscultation bilaterally and No wheezing; she is only moving about 700, no more than 750 on her incentive spirometer. GI: Soft, sore, port sites all look fine.  No BM and some abdominal pain primarily on the right this a.m.  Lab Results:  Recent Labs    12/11/18 0145 12/12/18 0312  WBC 11.3* 11.5*  HGB 9.9* 9.7*  HCT 32.6* 32.0*  PLT 299 330    BMET Recent Labs    12/11/18 0145 12/12/18 0312  NA 136 136  K 3.9 3.7  CL 105 104  CO2 24 23  GLUCOSE 118* 109*  BUN 17 7  CREATININE 1.21* 0.88  CALCIUM 8.1* 8.3*   PT/INR No results for input(s): LABPROT, INR in the last 72 hours.  Recent Labs  Lab 12/07/18 1701 12/11/18 0145  AST 16 34  ALT 17 39  ALKPHOS 61 68  BILITOT 0.3 0.5  PROT 7.0 6.2*  ALBUMIN 3.8 2.8*      Lipase     Component Value Date/Time   LIPASE 22 12/07/2018 1701     Medications: . docusate sodium  100 mg Oral BID  . DULoxetine  60 mg Oral Daily  . enoxaparin (LOVENOX) injection  65 mg Subcutaneous Q24H  . fluconazole  200 mg Oral Daily  . fluticasone  1 spray Each Nare BID  . gabapentin  300 mg Oral q morning - 10a  . gabapentin  900 mg Oral QHS  . losartan  25 mg Oral Daily  . montelukast  10 mg Oral QHS  . pantoprazole  40 mg Oral Daily  . polyethylene glycol  17 g Oral Daily  . umeclidinium bromide  1 puff Inhalation Daily    Assessment/Plan HTN - home meds- stopped Losartan this AM (BP is up again) Depression - home meds Migraine Acute kidney injury - increasing fluid rate and stopping Losartan; Medicine consult - creatinine 0.85(8/15) >>1.68(8/17) >> 2.20(8/18) >>1.21(8/19)>>0.88(8/20) BMI 41.6 Hx of asthma/?  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  Continue to wean O2 based on                 saturation levels.  Perforated appendicitis with generalized peritonitis S/p laparoscopic appendectomy 8/16 Dr. Georgette Dover - POD#4 -  mobilize - Continue diet as tolerated and await return in bowel function. Add colace/miralax. Repeat labs in AM.    ID -Diflucan 8/17 >> day 4; Zosyn 8/15>>day 6,will need 10 days of antibiotics FEN -IVF @125cc /hr, HH diet VTE -SCDs, lovenox Foley -none Follow up -[facility]  Plan: We will give her Dulcolax suppository this morning and an enema as needed if she has no results with a suppository.  We will continue to mobilize her, attempt to wean off her O2.  I will get a chest x-ray and going to ask medicine to help [place] with her respiratory issues also.       LOS: 5 days    Alyssa Clay 12/12/2018 [phone]

## 2018-12-12 NOTE — Progress Notes (Signed)
Pt ambulated on the hallway for approximately 250 ft. Twice this afternoon.  Pt O2 saturation resting 90% on room air.  O2 saturation while ambulating was 93 on 2L.

## 2018-12-13 DIAGNOSIS — E876 Hypokalemia: Secondary | ICD-10-CM

## 2018-12-13 LAB — BASIC METABOLIC PANEL
Anion gap: 9 (ref 5–15)
BUN: <5 mg/dL — ABNORMAL LOW (ref 6–20)
CO2: 32 mmol/L (ref 22–32)
Calcium: 8.3 mg/dL — ABNORMAL LOW (ref 8.9–10.3)
Chloride: 98 mmol/L (ref 98–111)
Creatinine, Ser: 0.82 mg/dL (ref 0.44–1.00)
GFR calc Af Amer: >60 mL/min (ref >60)
GFR calc non Af Amer: >60 mL/min (ref >60)
Glucose, Bld: 105 mg/dL — ABNORMAL HIGH (ref 70–99)
Potassium: 2.9 mmol/L — ABNORMAL LOW (ref 3.5–5.1)
Sodium: 139 mmol/L (ref 135–145)

## 2018-12-13 LAB — CBC
HCT: 30.1 % — ABNORMAL LOW (ref 36.0–46.0)
Hemoglobin: 9.5 g/dL — ABNORMAL LOW (ref 12.0–15.0)
MCH: 28.6 pg (ref 26.0–34.0)
MCHC: 31.6 g/dL (ref 30.0–36.0)
MCV: 90.7 fL (ref 80.0–100.0)
Platelets: 307 10*3/uL (ref 150–400)
RBC: 3.32 MIL/uL — ABNORMAL LOW (ref 3.87–5.11)
RDW: 13.2 % (ref 11.5–15.5)
WBC: 10.4 10*3/uL (ref 4.0–10.5)
nRBC: 0 % (ref 0.0–0.2)

## 2018-12-13 LAB — MAGNESIUM: Magnesium: 2 mg/dL (ref 1.7–2.4)

## 2018-12-13 MED ORDER — FUROSEMIDE 10 MG/ML IJ SOLN
40.0000 mg | Freq: Once | INTRAMUSCULAR | Status: AC
  Administered 2018-12-13: 40 mg via INTRAVENOUS
  Filled 2018-12-13: qty 4

## 2018-12-13 MED ORDER — POTASSIUM CHLORIDE CRYS ER 20 MEQ PO TBCR
40.0000 meq | EXTENDED_RELEASE_TABLET | ORAL | Status: AC
  Administered 2018-12-13 (×2): 40 meq via ORAL
  Filled 2018-12-13 (×2): qty 2

## 2018-12-13 MED ORDER — SACCHAROMYCES BOULARDII 250 MG PO CAPS
250.0000 mg | ORAL_CAPSULE | Freq: Two times a day (BID) | ORAL | Status: DC
  Administered 2018-12-13 – 2018-12-14 (×3): 250 mg via ORAL
  Filled 2018-12-13 (×3): qty 1

## 2018-12-13 MED ORDER — POTASSIUM CHLORIDE CRYS ER 20 MEQ PO TBCR
40.0000 meq | EXTENDED_RELEASE_TABLET | Freq: Once | ORAL | Status: AC
  Administered 2018-12-13: 20:00:00 40 meq via ORAL
  Filled 2018-12-13: qty 2

## 2018-12-13 MED ORDER — AMOXICILLIN-POT CLAVULANATE 875-125 MG PO TABS
1.0000 | ORAL_TABLET | Freq: Two times a day (BID) | ORAL | Status: DC
  Administered 2018-12-13 – 2018-12-14 (×3): 1 via ORAL
  Filled 2018-12-13 (×3): qty 1

## 2018-12-13 NOTE — Progress Notes (Signed)
PROGRESS NOTE    Alyssa Clay  [id number] DOB: 08-Dec-1965 DOA: 12/07/2018 PCP: Caren Macadam, MD   Brief Narrative: 53 year old female with history of asthma, depression having 2 weeks of right lower quadrant abdominal pain, found to have acute appendicitis, status post surgery.  Noted to have AKI and medical team was consulted.  Subjective: Patient states that she is feeling better this morning compared to yesterday.  Able to breathe better.  Denies any cough.  No chest pain.    Assessment & Plan:  Hypoxia Patient was given Lasix yesterday due to significant weight gain in the hospital.  She is states that she passed a lot of urine yesterday.  Not charted.  Weight has decreased this morning.  Oxygenation appears to have improved.  Wean down oxygen as tolerated.  We will cut it down to 1 L this morning.  Continue incentive spirometry.  Replace potassium.  Give additional dose of Lasix today.  Etiology of hypoxia is multifactorial including fluid overload, chronic right hemidiaphragm paralysis, morbid obesity with likely obesity hypoventilation.  Since she otherwise is asymptomatic there is very low suspicion for venous thromboembolism.  She has been on DVT prophylaxis.  Hopefully she will not require home oxygen.  Acute kidney injury  Baseline creatinine 0.8.  Patient is creatinine peaked at 2.2 on 8/18.  Patient was hypotensive on 8/17.  She received contrast for CT scan on 8/15. ATN is a distinct possibility. She was also on losartan.    Patient was given IV fluids.  Renal function back to baseline.  Continue to monitor urine output. Renal ultrasound was unremarkable.  Perforated appendicitis with generalized peritonitis:  Status post laparoscopic appendicectomy on 8/16.  Management per general surgery.    Essential hypertension She was hypotensive on 8/17.  Blood pressures now in the hypertensive range.  Started back on her losartan.  Hydralazine as needed.     Depression Cymbalta.  Normocytic anemia:  Hemoglobin is low but stable.  No evidence of overt bleeding.  Continue to monitor.  Morbid obesity Body mass index is 51.37 kg/m.    DVT prophylaxis: lovenox   Procedures: Appendicectomy  Microbiology: NONE  Antimicrobials: Anti-infectives (From admission, onward)   Start     Dose/Rate Route Frequency Ordered Stop   12/13/18 0845  amoxicillin-clavulanate (AUGMENTIN) 875-125 MG per tablet 1 tablet     1 tablet Oral Every 12 hours 12/13/18 0835 12/19/18 0959   12/12/18 1000  fluconazole (DIFLUCAN) tablet 200 mg     200 mg Oral Daily 12/11/18 1431 12/14/18 2359   12/09/18 1330  fluconazole (DIFLUCAN) tablet 50 mg  Status:  Discontinued     50 mg Oral Daily 12/09/18 1328 12/11/18 1431   12/08/18 1330  piperacillin-tazobactam (ZOSYN) IVPB 3.375 g  Status:  Discontinued     3.375 g 12.5 mL/hr over 240 Minutes Intravenous Every 8 hours 12/08/18 1116 12/13/18 0836   12/08/18 0400  piperacillin-tazobactam (ZOSYN) IVPB 3.375 g  Status:  Discontinued     3.375 g 12.5 mL/hr over 240 Minutes Intravenous Every 8 hours 12/07/18 2345 12/08/18 1116   12/07/18 2215  piperacillin-tazobactam (ZOSYN) IVPB 3.375 g     3.375 g 100 mL/hr over 30 Minutes Intravenous  Once 12/07/18 2211 12/07/18 2253       Objective: Vitals:   12/13/18 0452 12/13/18 0500 12/13/18 0904 12/13/18 1012  BP: (!) 118/57   (!) 130/52  Pulse: 93   90  Resp: 16   16  Temp: 98.4 F (36.9  C)   98.7 F (37.1 C)  TempSrc: Oral   Oral  SpO2: 95%  96% 94%  Weight:  (!) 142.3 kg  (!) 142.2 kg  Height:        Intake/Output Summary (Last 24 hours) at 12/13/2018 1144 Last data filed at 12/13/2018 1117 Gross per 24 hour  Intake 965.83 ml  Output 651 ml  Net 314.83 ml   Filed Weights   12/12/18 1148 12/13/18 0500 12/13/18 1012  Weight: (!) 144.2 kg (!) 142.3 kg (!) 142.2 kg   Weight change:   Body mass index is 51.37 kg/m.  Intake/Output from previous day: 08/20  0701 - 08/21 0700 In: 785.8 [P.O.:540; IV Piggyback:245.8] Out: -  Intake/Output this shift: Total I/O In: 300 [P.O.:300] Out: 651 [Urine:651]  Examination:  General appearance: Awake alert.  In no distress.  Morbidly obese Resp: Normal effort at rest.  Clear to auscultation bilaterally.  No wheezing rales or rhonchi.   Cardio: S1-S2 is normal regular.  No S3-S4.  No rubs murmurs or bruit GI: Abdomen is soft.  Nontender nondistended.  Bowel sounds are present normal.  No masses organomegaly Extremities: No edema.   Neurologic: Alert and oriented x3.  No focal neurological deficits.    Medications:  Scheduled Meds: . amoxicillin-clavulanate  1 tablet Oral Q12H  . docusate sodium  100 mg Oral BID  . DULoxetine  60 mg Oral Daily  . enoxaparin (LOVENOX) injection  65 mg Subcutaneous Q24H  . fluconazole  200 mg Oral Daily  . fluticasone  1 spray Each Nare BID  . gabapentin  300 mg Oral q morning - 10a  . gabapentin  900 mg Oral QHS  . losartan  25 mg Oral Daily  . montelukast  10 mg Oral QHS  . pantoprazole  40 mg Oral Daily  . polyethylene glycol  17 g Oral Daily  . saccharomyces boulardii  250 mg Oral BID  . umeclidinium bromide  1 puff Inhalation Daily   Continuous Infusions:   Data Reviewed: I have personally reviewed following labs and imaging studies  CBC: Recent Labs  Lab 12/09/18 1652 12/10/18 0108 12/11/18 0145 12/12/18 0312 12/13/18 0121  WBC 15.7* 15.0* 11.3* 11.5* 10.4  HGB 10.1* 9.8* 9.9* 9.7* 9.5*  HCT 32.6* 32.0* 32.6* 32.0* 30.1*  MCV 93.4 94.7 95.6 94.4 90.7  PLT 295 306 299 330 [id number]   Basic Metabolic Panel: Recent Labs  Lab 12/09/18 0723 12/10/18 0108 12/11/18 0145 12/12/18 0312 12/13/18 0121  NA 137 133* 136 136 139  K 4.5 3.7 3.9 3.7 2.9*  CL 102 101 105 104 98  CO2 27 22 24 23  32  GLUCOSE 121* 122* 118* 109* 105*  BUN 18 27* 17 7 <5*  CREATININE 1.68* 2.20* 1.21* 0.88 0.82  CALCIUM 8.3* 8.0* 8.1* 8.3* 8.3*  MG  --   --   --   --   2.0   GFR: Estimated Creatinine Clearance: 118.9 mL/min (by C-G formula based on SCr of 0.82 mg/dL). Liver Function Tests: Recent Labs  Lab 12/07/18 1701 12/11/18 0145  AST 16 34  ALT 17 39  ALKPHOS 61 68  BILITOT 0.3 0.5  PROT 7.0 6.2*  ALBUMIN 3.8 2.8*   Recent Labs  Lab 12/07/18 1701  LIPASE 22     Recent Results (from the past 240 hour(s))  SARS Coronavirus 2 [facility] order, Performed in [facility] hospital lab) Nasopharyngeal Nasopharyngeal Swab     Status: None   Collection Time: 12/07/18  8:56  PM   Specimen: Nasopharyngeal Swab  Result Value Ref Range Status   SARS Coronavirus 2 NEGATIVE NEGATIVE Final    Comment: (NOTE) If result is NEGATIVE SARS-CoV-2 target nucleic acids are NOT DETECTED. The SARS-CoV-2 RNA is generally detectable in upper and lower  respiratory specimens during the acute phase of infection. The lowest  concentration of SARS-CoV-2 viral copies this assay can detect is 250  copies / mL. A negative result does not preclude SARS-CoV-2 infection  and should not be used as the sole basis for treatment or other  patient management decisions.  A negative result may occur with  improper specimen collection / handling, submission of specimen other  than nasopharyngeal swab, presence of viral mutation(s) within the  areas targeted by this assay, and inadequate number of viral copies  (<250 copies / mL). A negative result must be combined with clinical  observations, patient history, and epidemiological information. If result is POSITIVE SARS-CoV-2 target nucleic acids are DETECTED. The SARS-CoV-2 RNA is generally detectable in upper and lower  respiratory specimens dur ing the acute phase of infection.  Positive  results are indicative of active infection with SARS-CoV-2.  Clinical  correlation with patient history and other diagnostic information is  necessary to determine patient infection status.  Positive results do  not rule out bacterial  infection or co-infection with other viruses. If result is PRESUMPTIVE POSTIVE SARS-CoV-2 nucleic acids MAY BE PRESENT.   A presumptive positive result was obtained on the submitted specimen  and confirmed on repeat testing.  While 2019 novel coronavirus  (SARS-CoV-2) nucleic acids may be present in the submitted sample  additional confirmatory testing may be necessary for epidemiological  and / or clinical management purposes  to differentiate between  SARS-CoV-2 and other Sarbecovirus currently known to infect humans.  If clinically indicated additional testing with an alternate test  methodology [phone]) is advised. The SARS-CoV-2 RNA is generally  detectable in upper and lower respiratory sp ecimens during the acute  phase of infection. The expected result is Negative. Fact Sheet for Patients:  StrictlyIdeas.no Fact Sheet for Healthcare Providers: BankingDealers.co.za This test is not yet approved or cleared by the [place] FDA and has been authorized for detection and/or diagnosis of SARS-CoV-2 by FDA under an Emergency Use Authorization (EUA).  This EUA will remain in effect (meaning this test can be used) for the duration of the COVID-19 declaration under Section 564(b)(1) of the Act, 21 U.S.C. section 360bbb-3(b)(1), unless the authorization is terminated or revoked sooner. Performed at [facility], [place] [address], [place], [place] [address]   Surgical pcr screen     Status: None   Collection Time: 12/08/18 12:25 AM   Specimen: Nasal Mucosa; Nasal Swab  Result Value Ref Range Status   MRSA, PCR NEGATIVE NEGATIVE Final   Staphylococcus aureus NEGATIVE NEGATIVE Final    Comment: (NOTE) The Xpert SA Assay (FDA approved for NASAL specimens in patients 85 years of age and older), is one component of a comprehensive surveillance program. It is not intended to diagnose infection nor to guide or monitor treatment.  Performed at [facility], [place] [address], [place], [place] [address]       Radiology Studies: Dg Chest 2 View  Result Date: 12/12/2018 CLINICAL DATA:  Shortness of breath EXAM: CHEST - 2 VIEW COMPARISON:  May 17, 2018 FINDINGS: There is stable elevation of the right hemidiaphragm with right base atelectasis. There is no consolidation or appreciable edema. Heart is  mildly enlarged with pulmonary venous hypertension. No adenopathy. No bone lesions. IMPRESSION: There is a degree of pulmonary vascular congestion without edema or consolidation. Stable elevation of the right hemidiaphragm with right base atelectasis. Electronically Signed   By: Lowella Grip III M.D.   On: 12/12/2018 09:31      LOS: 6 days    Bonnielee Haff, MD [facility]  12/13/2018, 11:44 AM

## 2018-12-13 NOTE — Discharge Instructions (Signed)
LAPAROSCOPIC SURGERY: POST OP INSTRUCTIONS  ######################################################################  EAT Gradually transition to a high fiber diet with a fiber supplement over the next few weeks after discharge.  Start with a pureed / full liquid diet (see below)  WALK Walk an hour a day.  Control your pain to do that.    CONTROL PAIN Control pain so that you can walk, sleep, tolerate sneezing/coughing, go up/down stairs.  HAVE A BOWEL MOVEMENT DAILY Keep your bowels regular to avoid problems.  OK to try a laxative to override constipation.  OK to use an antidairrheal to slow down diarrhea.  Call if not better after 2 tries  CALL IF YOU HAVE PROBLEMS/CONCERNS Call if you are still struggling despite following these instructions. Call if you have concerns not answered by these instructions  ######################################################################    1. DIET: Follow a light bland diet & liquids the first 24 hours after arrival home, such as soup, liquids, starches, etc.  Be sure to drink plenty of fluids.  Quickly advance to a usual solid diet within a few days.  Avoid fast food or heavy meals as your are more likely to get nauseated or have irregular bowels.  A low-fat, high-fiber diet for the rest of your life is ideal.  2. Take your usually prescribed home medications unless otherwise directed.  3. PAIN CONTROL: a. Pain is best controlled by a usual combination of three different methods TOGETHER: i. Ice/Heat ii. Over the counter pain medication iii. Prescription pain medication b. Most patients will experience some swelling and bruising around the incisions.  Ice packs or heating pads (30-60 minutes up to 6 times a day) will help. Use ice for the first few days to help decrease swelling and bruising, then switch to heat to help relax tight/sore spots and speed recovery.  Some people prefer to use ice alone, heat alone, alternating between ice & heat.   Experiment to what works for you.  Swelling and bruising can take several weeks to resolve.   c. It is helpful to take an over-the-counter pain medication regularly for the first few weeks.  Choose one of the following that works best for you: i. Naproxen (Aleve, etc)  Two 237m tabs twice a day ii. Ibuprofen (Advil, etc) Three 2013mtabs four times a day (every meal & bedtime) iii. Acetaminophen (Tylenol, etc) 500-6508mour times a day (every meal & bedtime) d. A  prescription for pain medication (such as oxycodone, hydrocodone, tramadol, gabapentin, methocarbamol, etc) should be given to you upon discharge.  Take your pain medication as prescribed.  i. If you are having problems/concerns with the prescription medicine (does not control pain, nausea, vomiting, rash, itching, etc), please call us [phone] see if we need to switch you to a different pain medicine that will work better for you and/or control your side effect better. ii. If you need a refill on your pain medication, please give us [place] hour notice.  contact your pharmacy.  They will contact our office to request authorization. Prescriptions will not be filled after 5 pm or on week-ends  4. Avoid getting constipated.   a. Between the surgery and the pain medications, it is common to experience some constipation.   b. Increasing fluid intake and taking a fiber supplement (such as Metamucil, Citrucel, FiberCon, MiraLax, etc) 1-2 times a day regularly will usually help prevent this problem from occurring.   c. A mild laxative (prune juice, Milk of Magnesia, MiraLax, etc) should be taken according to  package directions if there are no bowel movements after 48 hours.   5. Watch out for diarrhea.   a. If you have many loose bowel movements, simplify your diet to bland foods & liquids for a few days.   b. Stop any stool softeners and decrease your fiber supplement.   c. Switching to mild anti-diarrheal medications (Kayopectate, Pepto  Bismol) can help.   d. If this worsens or does not improve, please call us.  6. Wash / shower every day.  You may shower over the dressings as they are waterproof.  Continue to shower over incision(s) after the dressing is off.  7. Remove your waterproof bandages 5 days after surgery.  You may leave the incision open to air.  You may replace a dressing/Band-Aid to cover the incision for comfort if you wish.   8. ACTIVITIES as tolerated:   a. You may resume regular (light) daily activities beginning the next day--such as daily self-care, walking, climbing stairs--gradually increasing activities as tolerated.  If you can walk 30 minutes without difficulty, it is safe to try more intense activity such as jogging, treadmill, bicycling, low-impact aerobics, swimming, etc. b. Save the most intensive and strenuous activity for last such as sit-ups, heavy lifting, contact sports, etc  Refrain from any heavy lifting or straining until you are off narcotics for pain control.   c. DO NOT PUSH THROUGH PAIN.  Let pain be your guide: If it hurts to do something, don't do it.  Pain is your body warning you to avoid that activity for another week until the pain goes down. d. You may drive when you are no longer taking prescription pain medication, you can comfortably wear a seatbelt, and you can safely maneuver your car and apply brakes. e. Dennis Bast may have sexual intercourse when it is comfortable.  9. FOLLOW UP in our office a. Please call [facility] at [phone] to set up an appointment to see your surgeon in the office for a follow-up appointment approximately 2-3 weeks after your surgery. b. Make sure that you call for this appointment the day you arrive home to insure a convenient appointment time.  10. IF YOU HAVE DISABILITY OR FAMILY LEAVE FORMS, BRING THEM TO THE OFFICE FOR PROCESSING.  DO NOT GIVE THEM TO YOUR DOCTOR.   WHEN TO CALL us [phone]: 1. Poor pain control 2. Reactions / problems with new  medications (rash/itching, nausea, etc)  3. Fever over 101.5 F (38.5 C) 4. Inability to urinate 5. Nausea and/or vomiting 6. Worsening swelling or bruising 7. Continued bleeding from incision. 8. Increased pain, redness, or drainage from the incision   The clinic staff is available to answer your questions during regular business hours (8:30am-5pm).  Please dont hesitate to call and ask to speak to one of our nurses for clinical concerns.   If you have a medical emergency, go to the nearest emergency room or call 911.  A surgeon from [facility] is always on call at the [facility], [place], [place], [place], [place]  [address] ? MAIN: [phone] ? TOLL FREE: [phone] ?  FAX [phone] [email or web address]  GETTING TO GOOD BOWEL HEALTH. Irregular bowel habits such as constipation and diarrhea can lead to many problems over time.  Having one soft bowel movement a day is the most important way to prevent further problems.  The anorectal canal is designed to handle stretching and feces to safely manage our  ability to get rid of solid waste (feces, poop, stool) out of our body.  BUT, hard constipated stools can act like ripping concrete bricks and diarrhea can be a burning fire to this very sensitive area of our body, causing inflamed hemorrhoids, anal fissures, increasing risk is perirectal abscesses, abdominal pain/bloating, an making irritable bowel worse.     The goal: ONE SOFT BOWEL MOVEMENT A DAY!  To have soft, regular bowel movements:   Drink at least 8 tall glasses of water a day.    Take plenty of fiber.  Fiber is the undigested part of plant food that passes into the colon, acting s natures broom to encourage bowel motility and movement.  Fiber can absorb and hold large amounts of water. This results in a larger, bulkier stool, which is soft and easier to pass. Work gradually over several weeks up to 6  servings a day of fiber (25g a day even more if needed) in the form of: o Vegetables -- Root (potatoes, carrots, turnips), leafy green (lettuce, salad greens, celery, spinach), or cooked high residue (cabbage, broccoli, etc) o Fruit -- Fresh (unpeeled skin & pulp), Dried (prunes, apricots, cherries, etc ),  or stewed ( applesauce)  o Whole grain breads, pasta, etc (whole wheat)  o Bran cereals   Bulking Agents -- This type of water-retaining fiber generally is easily obtained each day by one of the following:  o Psyllium bran -- The psyllium plant is remarkable because its ground seeds can retain so much water. This product is available as Metamucil, Konsyl, Effersyllium, Per Diem Fiber, or the less expensive generic preparation in drug and health food stores. Although labeled a laxative, it really is not a laxative.  o Methylcellulose -- This is another fiber derived from wood which also retains water. It is available as Citrucel. o Polyethylene Glycol - and artificial fiber commonly called Miralax or Glycolax.  It is helpful for people with gassy or bloated feelings with regular fiber o Flax Seed - a less gassy fiber than psyllium  No reading or other relaxing activity while on the toilet. If bowel movements take longer than 5 minutes, you are too constipated  AVOID CONSTIPATION.  High fiber and water intake usually takes care of this.  Sometimes a laxative is needed to stimulate more frequent bowel movements, but   Laxatives are not a good long-term solution as it can wear the colon out. o Osmotics (Milk of Magnesia, Fleets phosphosoda, Magnesium citrate, MiraLax, GoLytely) are safer than  o Stimulants (Senokot, Castor Oil, Dulcolax, Ex Lax)    o Do not take laxatives for more than 7days in a row.   IF SEVERELY CONSTIPATED, try a Bowel Retraining Program: o Do not use laxatives.  o Eat a diet high in roughage, such as bran cereals and leafy vegetables.  o Drink six (6) ounces of prune or  apricot juice each morning.  o Eat two (2) large servings of stewed fruit each day.  o Take one (1) heaping tablespoon of a psyllium-based bulking agent twice a day. Use sugar-free sweetener when possible to avoid excessive calories.  o Eat a normal breakfast.  o Set aside 15 minutes after breakfast to sit on the toilet, but do not strain to have a bowel movement.  o If you do not have a bowel movement by the third day, use an enema and repeat the above steps.   Controlling diarrhea o Switch to liquids and simpler foods for a few days to  avoid stressing your intestines further. o Avoid dairy products (especially milk & ice cream) for a short time.  The intestines often can lose the ability to digest lactose when stressed. o Avoid foods that cause gassiness or bloating.  Typical foods include beans and other legumes, cabbage, broccoli, and dairy foods.  Every person has some sensitivity to other foods, so listen to our body and avoid those foods that trigger problems for you. o Adding fiber (Citrucel, Metamucil, psyllium, Miralax) gradually can help thicken stools by absorbing excess fluid and retrain the intestines to act more normally.  Slowly increase the dose over a few weeks.  Too much fiber too soon can backfire and cause cramping & bloating. o Probiotics (such as active yogurt, Align, etc) may help repopulate the intestines and colon with normal bacteria and calm down a sensitive digestive tract.  Most studies show it to be of mild help, though, and such products can be costly. o Medicines: - Bismuth subsalicylate (ex. Kayopectate, Pepto Bismol) every 30 minutes for up to 6 doses can help control diarrhea.  Avoid if pregnant. - Loperamide (Immodium) can slow down diarrhea.  Start with two tablets (4mg  total) first and then try one tablet every 6 hours.  Avoid if you are having fevers or severe pain.  If you are not better or start feeling worse, stop all medicines and call your doctor for  advice o Call your doctor if you are getting worse or not better.  Sometimes further testing (cultures, endoscopy, X-ray studies, bloodwork, etc) may be needed to help diagnose and treat the cause of the diarrhea.

## 2018-12-13 NOTE — Progress Notes (Signed)
5 Days Post-Op    CC: Abdominal pain  Subjective: She is fine from a surgical standpoint.  She is still on oxygen 2 L.  She can get her incentive spirometry up to almost 1000 now.  Her port sites look fine.  Objective: Vital signs in last 24 hours: Temp:  [97.6 F (36.4 C)-98.4 F (36.9 C)] 98.4 F (36.9 C) (08/21 0452) Pulse Rate:  [86-97] 93 (08/21 0452) Resp:  [16-18] 16 (08/21 0452) BP: (118-140)/(57-72) 118/57 (08/21 0452) SpO2:  [93 %-98 %] 95 % (08/21 0452) Weight:  [142.3 kg-144.2 kg] 142.3 kg (08/21 0500) Last BM Date: 12/12/18 540 PO 245 IV Urine x 3 Stool x 1 Afebrile, O2 95% on  2 liters  BP well controlled with Losartan 25 mg QD K+ 2.9 - checking mag WBC 10.4 Creatinine is 0.82       Intake/Output from previous day: 08/20 0701 - 08/21 0700 In: 785.8 [P.O.:540; IV Piggyback:245.8] Out: -  Intake/Output this shift: No intake/output data recorded.  General appearance: alert, cooperative and no distress Resp: clear to auscultation bilaterally and No wheezing, getting up to almost 1000 on her incentive spirometry GI: Normal postop soreness.  Port sites look fine.  I told her to keep something dry over the lower left port site which is really under her pannus.  Tolerating soft diet, positive BM yesterday.  Lab Results:  Recent Labs    12/12/18 0312 12/13/18 0121  WBC 11.5* 10.4  HGB 9.7* 9.5*  HCT 32.0* 30.1*  PLT 330 307    BMET Recent Labs    12/12/18 0312 12/13/18 0121  NA 136 139  K 3.7 2.9*  CL 104 98  CO2 23 32  GLUCOSE 109* 105*  BUN 7 <5*  CREATININE 0.88 0.82  CALCIUM 8.3* 8.3*   PT/INR No results for input(s): LABPROT, INR in the last 72 hours.  Recent Labs  Lab 12/07/18 1701 12/11/18 0145  AST 16 34  ALT 17 39  ALKPHOS 61 68  BILITOT 0.3 0.5  PROT 7.0 6.2*  ALBUMIN 3.8 2.8*     Lipase     Component Value Date/Time   LIPASE 22 12/07/2018 1701     Medications: . docusate sodium  100 mg Oral BID  .  DULoxetine  60 mg Oral Daily  . enoxaparin (LOVENOX) injection  65 mg Subcutaneous Q24H  . fluconazole  200 mg Oral Daily  . fluticasone  1 spray Each Nare BID  . gabapentin  300 mg Oral q morning - 10a  . gabapentin  900 mg Oral QHS  . losartan  25 mg Oral Daily  . montelukast  10 mg Oral QHS  . pantoprazole  40 mg Oral Daily  . polyethylene glycol  17 g Oral Daily  . potassium chloride  40 mEq Oral Q4H  . umeclidinium bromide  1 puff Inhalation Daily    Assessment/Plan HTN - home meds- stopped Losartan this AM(BP is up again) Depression - home meds Migraine Acute kidney injury - increasing fluid rate and stopping Losartan; Medicine consult - creatinine 0.85(8/15) >>1.68(8/17) >> 2.20(8/18)>>1.21(8/19)>>0.88(8/20) BMI 51.6 Hypoxia - Hx of asthma, Paralyzed diaphragm -no history of OSA  -Asked medicine to assist/CXR this AM.  Continue to wean O2 based on                 saturation levels. Hypokakemia - being replaced, Mg pending  Perforated appendicitis with generalized peritonitis S/p laparoscopic appendectomy 8/16 Dr. Georgette Dover  POD#5 - mobilize - Continue diet  as tolerated and await return in bowel function. Add colace/miralax. Repeat labs in AM.    ID -Diflucan 8/17 >> day4; Zosyn 8/15-8/21>> Convert to Augmentin,will need 10 days of antibiotics FEN -IVF, HH diet VTE -SCDs, lovenox Foley -none Follow up -[facility] clinic and PCP  Plan: I give her 1 more day of Diflucan that her give her total of 5 days of Diflucan.  Convert her over to Augmentin through 12/18/2018, that we will give her 10 days of postop antibiotics.  From our standpoint she is ready to go.  Will plan discharge when she is off oxygen.  Potassium is already being replaced.  Appreciate Dr. Lyman Speller assistance.       LOS: 6 days    Alyssa Clay 12/13/2018 [phone]

## 2018-12-13 NOTE — Progress Notes (Signed)
Pt ambulated on the hallway for about 250 ft , O2 sat on room air is 88%-89% , pt verbalized that she feels fine, no SOB, pt is calm, Was put on 2lpm and O2 sat went up to 92%.

## 2018-12-13 NOTE — Progress Notes (Signed)
SATURATION QUALIFICATIONS: (This note is used to comply with regulatory documentation for home oxygen)  Patient Saturations on Room Air at Rest =90%  Patient Saturations on Room Air while Ambulating =88-89%  Patient Saturations on 2 Liters of oxygen while Ambulating = 92%  Please briefly explain why patient needs home oxygen: pt oxygen saturation goes down on activity

## 2018-12-14 ENCOUNTER — Other Ambulatory Visit: Payer: Self-pay | Admitting: Allergy and Immunology

## 2018-12-14 LAB — BASIC METABOLIC PANEL
Anion gap: 9 (ref 5–15)
BUN: <5 mg/dL — ABNORMAL LOW (ref 6–20)
CO2: 35 mmol/L — ABNORMAL HIGH (ref 22–32)
Calcium: 8.4 mg/dL — ABNORMAL LOW (ref 8.9–10.3)
Chloride: 95 mmol/L — ABNORMAL LOW (ref 98–111)
Creatinine, Ser: 0.86 mg/dL (ref 0.44–1.00)
GFR calc Af Amer: >60 mL/min (ref >60)
GFR calc non Af Amer: >60 mL/min (ref >60)
Glucose, Bld: 104 mg/dL — ABNORMAL HIGH (ref 70–99)
Potassium: 3.2 mmol/L — ABNORMAL LOW (ref 3.5–5.1)
Sodium: 139 mmol/L (ref 135–145)

## 2018-12-14 MED ORDER — POLYETHYLENE GLYCOL 3350 17 G PO PACK: 17.0000 g | PACK | Freq: Every day | ORAL | 0 refills | Status: DC

## 2018-12-14 MED ORDER — POTASSIUM CHLORIDE CRYS ER 20 MEQ PO TBCR
40.0000 meq | EXTENDED_RELEASE_TABLET | ORAL | Status: AC
  Administered 2018-12-14 (×2): 40 meq via ORAL
  Filled 2018-12-14 (×2): qty 2

## 2018-12-14 MED ORDER — OXYCODONE HCL 5 MG PO TABS: 5.0000 mg | ORAL_TABLET | ORAL | 0 refills | Status: DC | PRN

## 2018-12-14 MED ORDER — POLYETHYLENE GLYCOL 3350 17 G PO PACK: 17.0000 g | PACK | Freq: Every day | ORAL | Status: DC

## 2018-12-14 MED ORDER — AMOXICILLIN-POT CLAVULANATE 875-125 MG PO TABS: 1.0000 | ORAL_TABLET | Freq: Two times a day (BID) | ORAL | 0 refills | Status: DC

## 2018-12-14 NOTE — Progress Notes (Signed)
6 Days Post-Op   Subjective/Chief Complaint: Ambulating, on oxygen sitting down right now, tol diet, has had bm, is unaware when she passes flatus   Objective: Vital signs in last 24 hours: Temp:  [98.2 F (36.8 C)-99.3 F (37.4 C)] 99.2 F (37.3 C) (08/22 0441) Pulse Rate:  [88-97] 88 (08/22 0441) Resp:  [16-18] 18 (08/22 0441) BP: (124-140)/(52-69) 140/64 (08/22 0441) SpO2:  [94 %-98 %] 97 % (08/22 0821) Weight:  [142.2 kg] 142.2 kg (08/21 1012) Last BM Date: 12/12/18  Intake/Output from previous day: 08/21 0701 - 08/22 0700 In: 850 [P.O.:850] Out: 1951 [Urine:1951] Intake/Output this shift: No intake/output data recorded.  General appearance nad sitting in chair Resp: clear to auscultation bilaterally cv rrr GI: approp tender soft nondistended   Lab Results:  Recent Labs    12/12/18 0312 12/13/18 0121  WBC 11.5* 10.4  HGB 9.7* 9.5*  HCT 32.0* 30.1*  PLT 330 307   BMET Recent Labs    12/13/18 0121 12/14/18 0410  NA 139 139  K 2.9* 3.2*  CL 98 95*  CO2 32 35*  GLUCOSE 105* 104*  BUN <5* <5*  CREATININE 0.82 0.86  CALCIUM 8.3* 8.4*   PT/INR No results for input(s): LABPROT, INR in the last 72 hours. ABG No results for input(s): PHART, HCO3 in the last 72 hours.  Invalid input(s): PCO2, PO2  Studies/Results: No results found.  Anti-infectives: Anti-infectives (From admission, onward)   Start     Dose/Rate Route Frequency Ordered Stop   12/13/18 0845  amoxicillin-clavulanate (AUGMENTIN) 875-125 MG per tablet 1 tablet     1 tablet Oral Every 12 hours 12/13/18 0835 12/19/18 0959   12/12/18 1000  fluconazole (DIFLUCAN) tablet 200 mg     200 mg Oral Daily 12/11/18 1431 12/14/18 0842   12/09/18 1330  fluconazole (DIFLUCAN) tablet 50 mg  Status:  Discontinued     50 mg Oral Daily 12/09/18 1328 12/11/18 1431   12/08/18 1330  piperacillin-tazobactam (ZOSYN) IVPB 3.375 g  Status:  Discontinued     3.375 g 12.5 mL/hr over 240 Minutes Intravenous Every  8 hours 12/08/18 1116 12/13/18 0836   12/08/18 0400  piperacillin-tazobactam (ZOSYN) IVPB 3.375 g  Status:  Discontinued     3.375 g 12.5 mL/hr over 240 Minutes Intravenous Every 8 hours 12/07/18 2345 12/08/18 1116   12/07/18 2215  piperacillin-tazobactam (ZOSYN) IVPB 3.375 g     3.375 g 100 mL/hr over 30 Minutes Intravenous  Once 12/07/18 2211 12/07/18 2253      Assessment/Plan: POD 6 lap appy for perforated appy-Tsuei - mobilize - Continue diet as tolerated, continue miralax ID -Diflucan 8/17 >> day4 stop today; Zosyn 8/15-8/21>> Convert to Augmentin,will need 10 days of antibiotics FEN -IVF, HH diet VTE -SCDs, lovenox Foley -none Follow up -[facility] clinic and PCP dispo- discussed with Dr [name] Pink, appreciate help. If ambulatory sat >89 today can go home without o2  Rolm Bookbinder 12/14/2018

## 2018-12-14 NOTE — Progress Notes (Addendum)
PROGRESS NOTE    Alyssa Clay  [id number] DOB: Jul 21, 1965 DOA: 12/07/2018 PCP: Caren Macadam, MD   Brief Narrative: 53 year old female with history of asthma, depression having 2 weeks of right lower quadrant abdominal pain, found to have acute appendicitis, status post surgery.  Noted to have AKI and medical team was consulted.  Subjective: Patient states that she is feeling better this morning compared to yesterday.  Able to breathe better.  Denies any cough.  No chest pain.    Assessment & Plan:  Hypoxia Reason for hypoxia thought to be multifactorial including fluid overload, chronic right hemidiaphragm paralysis, morbid obesity with likely obesity hypoventilation, recent abdominal surgery, narcotics.  Patient was noted to have gained weight during this hospitalization.  Patient was given Lasix on 8/20 and 8/21.  She appears to have diuresed well.  Her oxygenation has improved.  She was ambulated yesterday with saturations dropping at the lowest to 88-89.  As long as she is saturating 89% and above she will not need home oxygen.  Will not give any further doses of Lasix as her bicarbonate seems to be trending upwards.  Hopefully she will continue to auto diurese.  She otherwise is asymptomatic without any chest pain or shortness of breath.  Very low suspicion for other etiologies such as venous thromboembolism.  She has been on DVT prophylaxis during this hospitalization.  Check ambulatory sats again today.  Acute kidney injury/Hypokalemia Baseline creatinine 0.8.  Patient is creatinine peaked at 2.2 on 8/18.  Patient was hypotensive on 8/17.  She received contrast for CT scan on 8/15. ATN is a distinct possibility. She was also on losartan.    Patient was given IV fluids.  Renal function back to baseline.  Continue to monitor urine output. Renal ultrasound was unremarkable.  Potassium level dropped due to furosemide.  Improved this morning.  Additional doses to be given  today.  Perforated appendicitis with generalized peritonitis:  Status post laparoscopic appendicectomy on 8/16.  Management per general surgery.    Essential hypertension She was hypotensive on 8/17.  Blood pressures now in the hypertensive range.  Started back on her losartan.  Hydralazine as needed.    Depression Cymbalta.  Normocytic anemia:  Hemoglobin is low but stable.  No evidence of overt bleeding.  Continue to monitor.  Morbid obesity Body mass index is 51.37 kg/m.    No further work-up from a medical standpoint.  Okay for discharge today after her saturations are checked this morning.  Hopefully she will not need home oxygen.  Discussed with Dr. Donne Hazel with general surgery.  DVT prophylaxis: lovenox   Procedures: Appendicectomy  Microbiology: NONE  Antimicrobials: Anti-infectives (From admission, onward)   Start     Dose/Rate Route Frequency Ordered Stop   12/14/18 0000  amoxicillin-clavulanate (AUGMENTIN) 875-125 MG tablet     1 tablet Oral 2 times daily 12/14/18 1017 12/18/18 2359   12/13/18 0845  amoxicillin-clavulanate (AUGMENTIN) 875-125 MG per tablet 1 tablet     1 tablet Oral Every 12 hours 12/13/18 0835 12/19/18 0959   12/12/18 1000  fluconazole (DIFLUCAN) tablet 200 mg     200 mg Oral Daily 12/11/18 1431 12/14/18 0842   12/09/18 1330  fluconazole (DIFLUCAN) tablet 50 mg  Status:  Discontinued     50 mg Oral Daily 12/09/18 1328 12/11/18 1431   12/08/18 1330  piperacillin-tazobactam (ZOSYN) IVPB 3.375 g  Status:  Discontinued     3.375 g 12.5 mL/hr over 240 Minutes Intravenous Every 8 hours  12/08/18 1116 12/13/18 0836   12/08/18 0400  piperacillin-tazobactam (ZOSYN) IVPB 3.375 g  Status:  Discontinued     3.375 g 12.5 mL/hr over 240 Minutes Intravenous Every 8 hours 12/07/18 2345 12/08/18 1116   12/07/18 2215  piperacillin-tazobactam (ZOSYN) IVPB 3.375 g     3.375 g 100 mL/hr over 30 Minutes Intravenous  Once 12/07/18 2211 12/07/18 2253        Objective: Vitals:   12/13/18 2018 12/14/18 0112 12/14/18 0441 12/14/18 0821  BP: (!) 129/57 135/69 140/64   Pulse: 95 92 88   Resp: 18 18 18    Temp: 99.3 F (37.4 C) 98.9 F (37.2 C) 99.2 F (37.3 C)   TempSrc: Oral Oral Oral   SpO2: 98% 95% 96% 97%  Weight:      Height:        Intake/Output Summary (Last 24 hours) at 12/14/2018 1059 Last data filed at 12/13/2018 2016 Gross per 24 hour  Intake 550 ml  Output 1951 ml  Net -1401 ml   Filed Weights   12/12/18 1148 12/13/18 0500 12/13/18 1012  Weight: (!) 144.2 kg (!) 142.3 kg (!) 142.2 kg   Weight change: -2 kg  Body mass index is 51.37 kg/m.  Intake/Output from previous day: 08/21 0701 - 08/22 0700 In: 850 [P.O.:850] Out: 1951 [id number] Intake/Output this shift: No intake/output data recorded.  Examination:  General appearance: Awake alert.  In no distress.  Morbidly obese Resp: Clear to auscultation bilaterally.  Normal effort Cardio: S1-S2 is normal regular.  No S3-S4.  No rubs murmurs or bruit GI: Abdomen is soft.  Nontender nondistended.  Bowel sounds are present normal.  No masses organomegaly Extremities: Improved edema lower extremities Neurologic: Alert and oriented x3.  No focal neurological deficits.     Medications:  Scheduled Meds: . amoxicillin-clavulanate  1 tablet Oral Q12H  . docusate sodium  100 mg Oral BID  . DULoxetine  60 mg Oral Daily  . enoxaparin (LOVENOX) injection  65 mg Subcutaneous Q24H  . fluticasone  1 spray Each Nare BID  . gabapentin  300 mg Oral q morning - 10a  . gabapentin  900 mg Oral QHS  . losartan  25 mg Oral Daily  . montelukast  10 mg Oral QHS  . pantoprazole  40 mg Oral Daily  . polyethylene glycol  17 g Oral Daily  . potassium chloride  40 mEq Oral Q4H  . saccharomyces boulardii  250 mg Oral BID  . umeclidinium bromide  1 puff Inhalation Daily   Continuous Infusions:   Data Reviewed: I have personally reviewed following labs and imaging studies  CBC:  Recent Labs  Lab 12/09/18 1652 12/10/18 0108 12/11/18 0145 12/12/18 0312 12/13/18 0121  WBC 15.7* 15.0* 11.3* 11.5* 10.4  HGB 10.1* 9.8* 9.9* 9.7* 9.5*  HCT 32.6* 32.0* 32.6* 32.0* 30.1*  MCV 93.4 94.7 95.6 94.4 90.7  PLT 295 306 299 330 [id number]   Basic Metabolic Panel: Recent Labs  Lab 12/10/18 0108 12/11/18 0145 12/12/18 0312 12/13/18 0121 12/14/18 0410  NA 133* 136 136 139 139  K 3.7 3.9 3.7 2.9* 3.2*  CL 101 105 104 98 95*  CO2 22 24 23  32 35*  GLUCOSE 122* 118* 109* 105* 104*  BUN 27* 17 7 <5* <5*  CREATININE 2.20* 1.21* 0.88 0.82 0.86  CALCIUM 8.0* 8.1* 8.3* 8.3* 8.4*  MG  --   --   --  2.0  --    GFR: Estimated Creatinine Clearance: 113.4 mL/min (by  C-G formula based on SCr of 0.86 mg/dL). Liver Function Tests: Recent Labs  Lab 12/07/18 1701 12/11/18 0145  AST 16 34  ALT 17 39  ALKPHOS 61 68  BILITOT 0.3 0.5  PROT 7.0 6.2*  ALBUMIN 3.8 2.8*   Recent Labs  Lab 12/07/18 1701  LIPASE 22     Recent Results (from the past 240 hour(s))  SARS Coronavirus 2 [facility] order, Performed in [facility] hospital lab) Nasopharyngeal Nasopharyngeal Swab     Status: None   Collection Time: 12/07/18  8:56 PM   Specimen: Nasopharyngeal Swab  Result Value Ref Range Status   SARS Coronavirus 2 NEGATIVE NEGATIVE Final    Comment: (NOTE) If result is NEGATIVE SARS-CoV-2 target nucleic acids are NOT DETECTED. The SARS-CoV-2 RNA is generally detectable in upper and lower  respiratory specimens during the acute phase of infection. The lowest  concentration of SARS-CoV-2 viral copies this assay can detect is 250  copies / mL. A negative result does not preclude SARS-CoV-2 infection  and should not be used as the sole basis for treatment or other  patient management decisions.  A negative result may occur with  improper specimen collection / handling, submission of specimen other  than nasopharyngeal swab, presence of viral mutation(s) within the  areas targeted by this  assay, and inadequate number of viral copies  (<250 copies / mL). A negative result must be combined with clinical  observations, patient history, and epidemiological information. If result is POSITIVE SARS-CoV-2 target nucleic acids are DETECTED. The SARS-CoV-2 RNA is generally detectable in upper and lower  respiratory specimens dur ing the acute phase of infection.  Positive  results are indicative of active infection with SARS-CoV-2.  Clinical  correlation with patient history and other diagnostic information is  necessary to determine patient infection status.  Positive results do  not rule out bacterial infection or co-infection with other viruses. If result is PRESUMPTIVE POSTIVE SARS-CoV-2 nucleic acids MAY BE PRESENT.   A presumptive positive result was obtained on the submitted specimen  and confirmed on repeat testing.  While 2019 novel coronavirus  (SARS-CoV-2) nucleic acids may be present in the submitted sample  additional confirmatory testing may be necessary for epidemiological  and / or clinical management purposes  to differentiate between  SARS-CoV-2 and other Sarbecovirus currently known to infect humans.  If clinically indicated additional testing with an alternate test  methodology [phone]) is advised. The SARS-CoV-2 RNA is generally  detectable in upper and lower respiratory sp ecimens during the acute  phase of infection. The expected result is Negative. Fact Sheet for Patients:  StrictlyIdeas.no Fact Sheet for Healthcare Providers: BankingDealers.co.za This test is not yet approved or cleared by the [place] FDA and has been authorized for detection and/or diagnosis of SARS-CoV-2 by FDA under an Emergency Use Authorization (EUA).  This EUA will remain in effect (meaning this test can be used) for the duration of the COVID-19 declaration under Section 564(b)(1) of the Act, 21 U.S.C. section 360bbb-3(b)(1),  unless the authorization is terminated or revoked sooner. Performed at [facility], [place] [address], [place], [place] [address]   Surgical pcr screen     Status: None   Collection Time: 12/08/18 12:25 AM   Specimen: Nasal Mucosa; Nasal Swab  Result Value Ref Range Status   MRSA, PCR NEGATIVE NEGATIVE Final   Staphylococcus aureus NEGATIVE NEGATIVE Final    Comment: (NOTE) The Xpert SA Assay (FDA approved for NASAL specimens in patients 22  years of age and older), is one component of a comprehensive surveillance program. It is not intended to diagnose infection nor to guide or monitor treatment. Performed at [facility], [place] [address] [place], [place] [address]       Radiology Studies: No results found.    LOS: 7 days    Bonnielee Haff, MD [facility]  12/14/2018, 10:59 AM

## 2018-12-14 NOTE — TOC Transition Note (Signed)
Transition of Care [facility]) - CM/SW Discharge Note   Patient Details  Name: Alyssa Clay MRN: [id number] Date of Birth: [birth year]  [facility]) CM/SW Contact:  Claudie Leach, RN Phone Number: 12/14/2018, 2:45 PM   Clinical Narrative:    Patient to go home with home O2.  O2 ordered from [place].  Portable concentrator will be delivered to patient's room prior to d/c.    Final next level of care: Home/Self Care Barriers to Discharge: No Barriers Identified   Patient Goals and CMS Choice Patient states their goals for this hospitalization and ongoing recovery are:: to go home CMS Medicare.gov Compare Post Acute Care list provided to:: Patient Choice offered to / list presented to : NA   Discharge Plan and Services                DME Arranged: Oxygen DME Agency: [employer] Date DME Agency Contacted: 12/14/18 Time DME Agency Contacted: [id number] Representative spoke with at DME Agency: Gerald: NA

## 2018-12-14 NOTE — Progress Notes (Signed)
SATURATION QUALIFICATIONS: (This note is used to comply with regulatory documentation for home oxygen)  Patient Saturations on Room Air at Rest = 90%  Patient Saturations on Room Air while Ambulating = 84%  Patient Saturations on 2 Liters of oxygen while Ambulating = 94%  Please briefly explain why patient needs home oxygen:  O2 sats drop while ambulating

## 2018-12-16 NOTE — Discharge Summary (Signed)
Physician Discharge Summary  Patient ID: Alyssa Clay MRN: [id number] DOB/AGE: August 08, 1965 53 y.o.  Admit date: 12/07/2018 Discharge date: 12/14/2018  Admission Diagnoses:  Acute appendicitis Hx of hypertension Depression  Migraine BMI 51.6   Discharge Diagnoses:  Acute appendicitis with generalized peritonitis Hx of hypertension Depression  Migraine Acute kidney injury BMI 51.6 Hypoxia - Hx of asthma,Paralyzed diaphragm -no history of OSA Hypokalemia  Active Problems:   Acute appendicitis   PROCEDURES: laparoscopic appendectomy 8/16 Dr. Alonna Buckler Course:  Patient presents emergency room with a history of right lower quadrant abdominal pain.  She said pain on and off she says of the last 2 weeks in her lower abdomen.  Over the last 24 hours she has had more severe right lower quadrant abdominal pain and had nausea and vomiting last night.  CT scan shows acute appendicitis without perforation.  She still complains of abdominal pain.  Location right lower quadrant without radiation.  Made worse with movement    Pt was seen and admitted by Dr. Brantley Stage.  Pt was placed on abx, IV fluids and taken to the OR the following AM by Dr. Georgette Dover.  Pt did well from the surgery.  Pt was found to have a perforation with feculent material leaking from the appendix.  Her diet was advanced, and she was very difficult to mobilize.  GHre creatinine went from 0.85 on admit to,  1.68 the 2.20 over the first 48 hours post op.  Her fluids were increased and Medicine was consulted.  She had been restarted on her PO antihypertensive medicine, her BP was down and the Losartan was held. Her renal function returned to normal over the next couple days, but she was using O2 more consistently.    We restarted her asthma medicines, even though she was not wheezing.  We also ask Medicine to help with her hypoxia and renal failure.  She was most likely developed fluid overload from her renal injury  treatment.  She was treated with fursomide, K+ for her hypokalemia and slowly improved.  She was maintaining saturation rates of 90% on room air, but dropped to 84% on room air while walking. She was seen on 12/14/18 by Dr. Donne Hazel and was ready for discharge.  She did not meet levels for discharge without oxygen.    She is to follow up with her PCP.  The last time I saw her incision on 12/13/18 they were all healing well Follow up is listed below.   We sent her home on the medicines listed below.  CBC Latest Ref Rng & Units 12/13/2018 12/12/2018 12/11/2018  WBC 4.0 - 10.5 K/uL 10.4 11.5(H) 11.3(H)  Hemoglobin 12.0 - 15.0 g/dL 9.5(L) 9.7(L) 9.9(L)  Hematocrit 36.0 - 46.0 % 30.1(L) 32.0(L) 32.6(L)  Platelets 150 - 400 K/uL 307 330 299   CMP Latest Ref Rng & Units 12/14/2018 12/13/2018 12/12/2018  Glucose 70 - 99 mg/dL 104(H) 105(H) 109(H)  BUN 6 - 20 mg/dL <5(L) <5(L) 7  Creatinine 0.44 - 1.00 mg/dL 0.86 0.82 0.88  Sodium 135 - 145 mmol/L 139 139 136  Potassium 3.5 - 5.1 mmol/L 3.2(L) 2.9(L) 3.7  Chloride 98 - 111 mmol/L 95(L) 98 104  CO2 22 - 32 mmol/L 35(H) 32 23  Calcium 8.9 - 10.3 mg/dL 8.4(L) 8.3(L) 8.3(L)  Total Protein 6.5 - 8.1 g/dL - - -  Total Bilirubin 0.3 - 1.2 mg/dL - - -  Alkaline Phos 38 - 126 U/L - - -  AST 15 -  41 U/L - - -  ALT 0 - 44 U/L - - -        Disposition:    Allergies as of 12/14/2018   No Known Allergies     Medication List    TAKE these medications   Aimovig 140 MG/ML Soaj Generic drug: Erenumab-aooe Inject 140 mg into the skin every 30 (thirty) days. Notes to patient: Per home use   albuterol (2.5 MG/3ML) 0.083% nebulizer solution Commonly known as: PROVENTIL Take 2.5 mg by nebulization every 6 (six) hours as needed for wheezing or shortness of breath. Notes to patient: Per home use   albuterol 108 (90 Base) MCG/ACT inhaler Commonly known as: VENTOLIN HFA Inhale 2 puffs into the lungs every 6 (six) hours as needed for wheezing or shortness of  breath. Notes to patient: Per home use   amoxicillin-clavulanate 875-125 MG tablet Commonly known as: Augmentin Take 1 tablet by mouth 2 (two) times daily for 4 days. Notes to patient: 12/14/2018-evening dose   budesonide-formoterol 160-4.5 MCG/ACT inhaler Commonly known as: Symbicort Inhale 2 puffs into the lungs 2 (two) times daily. Notes to patient: Per home use   buPROPion 150 MG 24 hr tablet Commonly known as: Wellbutrin XL Take 1 tablet (150 mg total) by mouth daily. Notes to patient: Per home use   calcium carbonate 1500 (600 Ca) MG Tabs tablet Commonly known as: OSCAL Take 600 mg of elemental calcium by mouth 2 (two) times daily with a meal. Notes to patient: Per home use   cyclobenzaprine 10 MG tablet Commonly known as: FLEXERIL Take 10 mg by mouth 3 (three) times daily as needed (migraines). Notes to patient: Per home use   DULoxetine 60 MG capsule Commonly known as: CYMBALTA TAKE 1 CAPSULE BY MOUTH EVERY DAY What changed: how much to take Notes to patient: 12/15/2018   Fasenra 30 MG/ML Sosy Generic drug: Benralizumab SECOND SHIP: INJECT ONE SYRINGE UNDER THE SKIN AT WEEK 4 AND 8, THEN EVERY 8 WEEKS THEREAFTER. What changed: See the new instructions. Notes to patient: Per home use   fluticasone 50 MCG/ACT nasal spray Commonly known as: FLONASE Place 1 spray into both nostrils 2 (two) times daily. Notes to patient: 12/14/2018-evening dose   gabapentin 300 MG capsule Commonly known as: NEURONTIN Take 300-900 mg by mouth See admin instructions. 300mg  in am and 900mg  in pm Notes to patient: 12/14/2018-evening dose   losartan 50 MG tablet Commonly known as: COZAAR Take 1 tablet (50 mg total) by mouth daily. Notes to patient: 12/15/2018   Maxalt 10 MG tablet Generic drug: rizatriptan Take 10 mg by mouth as needed for migraine. May repeat in 2 hours if needed Notes to patient: Per home use   meloxicam 7.5 MG tablet Commonly known as: MOBIC Take 1 tablet  (7.5 mg total) by mouth daily. What changed:   when to take this  reasons to take this Notes to patient: Per home use   methocarbamol 500 MG tablet Commonly known as: ROBAXIN Take 1 tablet (500 mg total) by mouth 2 (two) times daily as needed for muscle spasms. Notes to patient: Per home use   montelukast 10 MG tablet Commonly known as: SINGULAIR Take 1 tablet (10 mg total) by mouth daily. Notes to patient: 12/14/2018   multivitamin with minerals Tabs tablet Take 1 tablet by mouth daily. Notes to patient: Per home use   oxyCODONE 5 MG immediate release tablet Commonly known as: Oxy IR/ROXICODONE Take 1 tablet (5 mg total) by mouth every  4 (four) hours as needed for moderate pain. Notes to patient: 12/14/2018-after 5 pm   pantoprazole 40 MG tablet Commonly known as: PROTONIX Take 1 tablet (40 mg total) by mouth 2 (two) times daily before a meal. What changed: when to take this Notes to patient: 12/14/2018-evening dose   polyethylene glycol 17 g packet Commonly known as: MIRALAX / GLYCOLAX Take 17 g by mouth daily. Notes to patient: 12/15/2018   Tiotropium Bromide Monohydrate 1.25 MCG/ACT Aers Commonly known as: Spiriva Respimat Inhale 2 puffs into the lungs daily. Notes to patient: Per home use   traZODone 100 MG tablet Commonly known as: DESYREL Take 1-2 tablets (100-200 mg total) by mouth at bedtime as needed for sleep. Notes to patient: 12/14/2018   valACYclovir 1000 MG tablet Commonly known as: VALTREX Take 1 tablet (1,000 mg total) by mouth 2 (two) times daily. What changed:   when to take this  reasons to take this Notes to patient: Per home use      Follow-up Information    [facility] [place] Follow up on 12/26/2018.   Specialty: General Surgery Why: your appointment is at 2:45 PM.  Be at the office 30 minutes early for check in.  Bring photo ID and insurance information.   Contact information: [address] [place]  [id number] [id number]        Caren Macadam, MD Follow up.   Specialty: Family Medicine Why: call and make an appointment to review medicines and medical issues in next 1-2 weeks Contact information: [place] [address] [phone]           Signed: Earnstine Regal 12/16/2018, 10:23 AM

## 2018-12-18 ENCOUNTER — Encounter: Payer: Self-pay | Admitting: Family Medicine

## 2018-12-18 ENCOUNTER — Other Ambulatory Visit: Payer: Self-pay

## 2018-12-18 ENCOUNTER — Telehealth: Payer: Self-pay | Admitting: *Deleted

## 2018-12-18 ENCOUNTER — Ambulatory Visit (INDEPENDENT_AMBULATORY_CARE_PROVIDER_SITE_OTHER): Payer: 59 | Admitting: Family Medicine

## 2018-12-18 VITALS — BP 151/78 | Temp 97.8°F | Wt 294.0 lb

## 2018-12-18 DIAGNOSIS — D649 Anemia, unspecified: Secondary | ICD-10-CM | POA: Diagnosis not present

## 2018-12-18 DIAGNOSIS — R109 Unspecified abdominal pain: Secondary | ICD-10-CM

## 2018-12-18 DIAGNOSIS — R7989 Other specified abnormal findings of blood chemistry: Secondary | ICD-10-CM | POA: Diagnosis not present

## 2018-12-18 DIAGNOSIS — E876 Hypokalemia: Secondary | ICD-10-CM

## 2018-12-18 DIAGNOSIS — R197 Diarrhea, unspecified: Secondary | ICD-10-CM

## 2018-12-18 DIAGNOSIS — R0902 Hypoxemia: Secondary | ICD-10-CM | POA: Diagnosis not present

## 2018-12-18 NOTE — Telephone Encounter (Signed)
Lab appt scheduled for 8/27 at 7:20am.  No openings were available for next week.

## 2018-12-18 NOTE — Progress Notes (Signed)
Virtual Visit via Video Note  I connected with Alyssa Clay  on 12/18/18 at  2:00 PM EDT by a video enabled telemedicine application and verified that I am speaking with the correct person using two identifiers.  Location patient: home Location provider:work or home office Persons participating in the virtual visit: patient, provider  I discussed the limitations of evaluation and management by telemedicine and the availability of in person appointments. The patient expressed understanding and agreed to proceed.   Alyssa Clay DOB: June 13, 1965 Encounter date: 12/18/2018  This is a 53 y.o. female who presents with Chief Complaint  Patient presents with  . Hospitalization Follow-up    History of present illness: Admitted 8/15 and discharged 8/22 with dx of acute appendicitis. Had laproscopic appendectomy 12/08/18. Prolonged course complicated with difficulty with hypotension, pain control. Sent home on O2 due to hypoxia with activity (84 % with walking). Did not have good experience in hospital. Felt that wait in ER was excessive, difficult to get help on the floor, difficulty with getting pain managed.  Electrolytes still abnormal with bloodwork on 8/22. Anemia (hb 9.5) on cbc with normal wbc on 8/21.   When she came back from hospital was in so much pain, stomach was very bloated. Took miralax, colace and tried to follow suggested diet. Today and yesterday having diarrhea now. Stopped miralax and colace to try and recover. Still with bloating.   Had issues with hypotension and then had to pump IVF so she gained a lot of weight. Just feels bloated, swollen. Feet, legs, swollen but slightly better than in hospital. Drinking a lot of fluids, and was urinating a lot but slightly less today.   Has had a couple of 99.5 temps, but nothing higher.   Blood pressure has been running higher.   Thinks she pulled muscle right up under right breast/abd so that is also tender. This  is getting better.   They have some questions:  *what to do with oxygen - she is on 2L continuous right now.   *when to see asthma/allergy again: discussed calling for follow up.   Breathing overall feels just ok. Not as good as baseline. Using spirometer and using her regular inhalers.   No Known Allergies Current Meds  Medication Sig  . AIMOVIG 140 MG/ML SOAJ Inject 140 mg into the skin every 30 (thirty) days.  Marland Kitchen albuterol (PROVENTIL HFA;VENTOLIN HFA) 108 (90 Base) MCG/ACT inhaler Inhale 2 puffs into the lungs every 6 (six) hours as needed for wheezing or shortness of breath.  Marland Kitchen albuterol (PROVENTIL) (2.5 MG/3ML) 0.083% nebulizer solution Take 2.5 mg by nebulization every 6 (six) hours as needed for wheezing or shortness of breath.  . budesonide-formoterol (SYMBICORT) 160-4.5 MCG/ACT inhaler Inhale 2 puffs into the lungs 2 (two) times daily.  . calcium carbonate (OSCAL) 1500 (600 Ca) MG TABS tablet Take 600 mg of elemental calcium by mouth 2 (two) times daily with a meal.  . cyclobenzaprine (FLEXERIL) 10 MG tablet Take 10 mg by mouth 3 (three) times daily as needed (migraines).  . DULoxetine (CYMBALTA) 60 MG capsule TAKE 1 CAPSULE BY MOUTH EVERY DAY (Patient taking differently: Take 60 mg by mouth daily. )  . FASENRA 30 MG/ML SOSY SECOND SHIP: INJECT ONE SYRINGE UNDER THE SKIN AT WEEK 4 AND 8, THEN EVERY 8 WEEKS THEREAFTER. (Patient taking differently: Inject 30 mg into the skin See admin instructions. Every 8 weeks)  . fluticasone (FLONASE) 50 MCG/ACT nasal spray Place 1 spray into both nostrils  2 (two) times daily.  Marland Kitchen gabapentin (NEURONTIN) 300 MG capsule Take 300-900 mg by mouth See admin instructions. 300mg  in am and 900mg  in pm  . losartan (COZAAR) 50 MG tablet Take 1 tablet (50 mg total) by mouth daily.  . meloxicam (MOBIC) 7.5 MG tablet Take 1 tablet (7.5 mg total) by mouth daily. (Patient taking differently: Take 7.5 mg by mouth daily as needed for pain. )  . methocarbamol (ROBAXIN)  500 MG tablet Take 1 tablet (500 mg total) by mouth 2 (two) times daily as needed for muscle spasms.  . montelukast (SINGULAIR) 10 MG tablet Take 1 tablet (10 mg total) by mouth daily.  . Multiple Vitamin (MULTIVITAMIN WITH MINERALS) TABS tablet Take 1 tablet by mouth daily.  Marland Kitchen oxyCODONE (OXY IR/ROXICODONE) 5 MG immediate release tablet Take 1 tablet (5 mg total) by mouth every 4 (four) hours as needed for moderate pain.  . pantoprazole (PROTONIX) 40 MG tablet Take 1 tablet (40 mg total) by mouth 2 (two) times daily before a meal. (Patient taking differently: Take 40 mg by mouth daily. )  . polyethylene glycol (MIRALAX / GLYCOLAX) 17 g packet Take 17 g by mouth daily.  . rizatriptan (MAXALT) 10 MG tablet Take 10 mg by mouth as needed for migraine. May repeat in 2 hours if needed  . Tiotropium Bromide Monohydrate (SPIRIVA RESPIMAT) 1.25 MCG/ACT AERS Inhale 2 puffs into the lungs daily.  . traZODone (DESYREL) 100 MG tablet Take 1-2 tablets (100-200 mg total) by mouth at bedtime as needed for sleep.  . valACYclovir (VALTREX) 1000 MG tablet Take 1 tablet (1,000 mg total) by mouth 2 (two) times daily. (Patient taking differently: Take 1,000 mg by mouth 2 (two) times daily as needed (outbreak). )  . [DISCONTINUED] amoxicillin-clavulanate (AUGMENTIN) 875-125 MG tablet Take 1 tablet by mouth 2 (two) times daily for 4 days.  . [DISCONTINUED] buPROPion (WELLBUTRIN XL) 150 MG 24 hr tablet Take 1 tablet (150 mg total) by mouth daily.    Review of Systems  Constitutional: Positive for activity change (not moving around as much), appetite change and fatigue. Negative for chills and fever.  HENT: Negative for congestion and trouble swallowing.   Respiratory: Positive for shortness of breath (working on breathing; slightly worse than baseline). Negative for cough, chest tightness and wheezing.   Cardiovascular: Negative for chest pain, palpitations and leg swelling.  Gastrointestinal: Positive for abdominal  distention (just feels bloated, but it is improving), abdominal pain (rates as 4/10) and diarrhea. Negative for blood in stool, constipation, nausea and vomiting.  Genitourinary: Positive for frequency (some frequency of urination but feels body is trying to get rid of IVF from hospital stay; no discomfort). Negative for difficulty urinating.    Objective:  There were no vitals taken for this visit.      BP Readings from Last 3 Encounters:  12/14/18 132/79  07/10/18 127/77  07/04/18 127/84   Wt Readings from Last 3 Encounters:  12/13/18 (!) 313 lb 7.9 oz (142.2 kg)  07/10/18 285 lb (129.3 kg)  07/04/18 285 lb 6.4 oz (129.5 kg)    EXAM:  GENERAL: alert, oriented, appears well and in no acute distress  HEENT: atraumatic, conjunctiva clear, no obvious abnormalities on inspection of external nose and ears  NECK: normal movements of the head and neck  LUNGS: on inspection no signs of respiratory distress, breathing rate appears normal, no obvious gross SOB, gasping or wheezing. She is on nasal canula oxygen.  CV: no obvious cyanosis  MS:  moves all visible extremities without noticeable abnormality  PSYCH/NEURO: pleasant and cooperative, no obvious depression or anxiety, speech and thought processing grossly intact   Assessment/Plan  1. Hypokalemia Recheck bloodwork. Suspect this may be worse due to diarrhea. Discussed working on higher potassium foods (potato, banana)  2. Elevated serum creatinine Will recheck. Discussed importance of adequate water intake and staying well-hydrated. - Comprehensive metabolic panel; Future  3. Anemia, unspecified type We will recheck for stability. - CBC with Differential/Platelet; Future  4. Hypoxia Currently on 2 L nasal cannula.  Oxygen saturations dropped in the hospital with walking to 84%.  We discussed trying to wean off oxygen at home and doing testing.  As tolerated at rest.  Discussed that we would like to see at least 88%  saturation with walking in order to do without oxygen.  I encouraged her to schedule follow-up with asthma/allergy specialist in case needed or having difficulty with weaning.  We discussed importance of incentive spirometry.  We discussed that if she becomes more active and pain is better controlled we should see a decrease in need for oxygen.  5. Diarrhea, unspecified type Improving.  We discussed avoiding dairy.  We discussed staying well-hydrated.  Blander and easier foods to digest.  6. Abdominal pain, unspecified abdominal location Abdominal pain is improving status post appendectomy.  We discussed that pain should continue to improve greatly through the end of this week.  Certainly be in touch with any concerns or worsening of any of symptoms.  We spent most of visit discussing expectations for recovering from surgery and gaining strength back from hospital stay.  Return in about 1 week (around 12/25/2018).    I discussed the assessment and treatment plan with the patient. The patient was provided an opportunity to ask questions and all were answered. The patient agreed with the plan and demonstrated an understanding of the instructions.   The patient was advised to call back or seek an in-person evaluation if the symptoms worsen or if the condition fails to improve as anticipated.  I provided 36 minutes of non-face-to-face time during this encounter.   Micheline Rough, MD

## 2018-12-18 NOTE — Telephone Encounter (Signed)
-----   Message from Caren Macadam, MD sent at 12/18/2018  2:41 PM EDT ----- Set up bloodwork for tomorrow please! Follow up pending these results. If there is opening next week put her in there.

## 2018-12-19 ENCOUNTER — Encounter: Payer: Self-pay | Admitting: Family Medicine

## 2018-12-19 ENCOUNTER — Other Ambulatory Visit (INDEPENDENT_AMBULATORY_CARE_PROVIDER_SITE_OTHER): Payer: 59

## 2018-12-19 DIAGNOSIS — R7989 Other specified abnormal findings of blood chemistry: Secondary | ICD-10-CM | POA: Diagnosis not present

## 2018-12-19 DIAGNOSIS — D649 Anemia, unspecified: Secondary | ICD-10-CM

## 2018-12-19 LAB — COMPREHENSIVE METABOLIC PANEL
ALT: 11 U/L (ref 0–35)
AST: 11 U/L (ref 0–37)
Albumin: 3.4 g/dL — ABNORMAL LOW (ref 3.5–5.2)
Alkaline Phosphatase: 58 U/L (ref 39–117)
BUN: 6 mg/dL (ref 6–23)
CO2: 32 mEq/L (ref 19–32)
Calcium: 8.7 mg/dL (ref 8.4–10.5)
Chloride: 104 mEq/L (ref 96–112)
Creatinine, Ser: 0.87 mg/dL (ref 0.40–1.20)
GFR: 68.67 mL/min (ref >60.00)
Glucose, Bld: 94 mg/dL (ref 70–99)
Potassium: 4.5 mEq/L (ref 3.5–5.1)
Sodium: 145 mEq/L (ref 135–145)
Total Bilirubin: 0.3 mg/dL (ref 0.2–1.2)
Total Protein: 6.3 g/dL (ref 6.0–8.3)

## 2018-12-19 LAB — CBC WITH DIFFERENTIAL/PLATELET
Basophils Absolute: 0 10*3/uL (ref 0.0–0.1)
Basophils Relative: 0.2 % (ref 0.0–3.0)
Eosinophils Absolute: 0 10*3/uL (ref 0.0–0.7)
Eosinophils Relative: 0 % (ref 0.0–5.0)
HCT: 31.1 % — ABNORMAL LOW (ref 36.0–46.0)
Hemoglobin: 10.2 g/dL — ABNORMAL LOW (ref 12.0–15.0)
Lymphocytes Relative: 13.8 % (ref 12.0–46.0)
Lymphs Abs: 1.4 10*3/uL (ref 0.7–4.0)
MCHC: 32.8 g/dL (ref 30.0–36.0)
MCV: 89.2 fl (ref 78.0–100.0)
Monocytes Absolute: 0.7 10*3/uL (ref 0.1–1.0)
Monocytes Relative: 7.1 % (ref 3.0–12.0)
Neutro Abs: 8.3 10*3/uL — ABNORMAL HIGH (ref 1.4–7.7)
Neutrophils Relative %: 78.9 % — ABNORMAL HIGH (ref 43.0–77.0)
Platelets: 416 10*3/uL — ABNORMAL HIGH (ref 150.0–400.0)
RBC: 3.49 Mil/uL — ABNORMAL LOW (ref 3.87–5.11)
RDW: 15 % (ref 11.5–15.5)
WBC: 10.5 10*3/uL (ref 4.0–10.5)

## 2018-12-21 ENCOUNTER — Encounter: Payer: Self-pay | Admitting: Family Medicine

## 2018-12-23 ENCOUNTER — Encounter: Payer: Self-pay | Admitting: Family Medicine

## 2018-12-24 ENCOUNTER — Encounter: Payer: Self-pay | Admitting: Family Medicine

## 2018-12-24 ENCOUNTER — Telehealth: Payer: Self-pay | Admitting: *Deleted

## 2018-12-24 ENCOUNTER — Other Ambulatory Visit: Payer: Self-pay | Admitting: Family Medicine

## 2018-12-24 DIAGNOSIS — R739 Hyperglycemia, unspecified: Secondary | ICD-10-CM

## 2018-12-24 DIAGNOSIS — R5383 Other fatigue: Secondary | ICD-10-CM

## 2018-12-24 DIAGNOSIS — E785 Hyperlipidemia, unspecified: Secondary | ICD-10-CM

## 2018-12-24 DIAGNOSIS — D649 Anemia, unspecified: Secondary | ICD-10-CM

## 2018-12-24 NOTE — Telephone Encounter (Signed)
Happy to work with her to make things convenient. I would actually prefer labs prior to appointment so we can review numbers together.   She doesn't need to fast for long period, but if she can go like 3 hours without eating (drinking water is fine) that will help with looking at sugar.   I am ok with 9/28 appt if she is feeling ok.

## 2018-12-24 NOTE — Telephone Encounter (Signed)
Copied from [place] [id number]. Topic: General - Inquiry >> Dec 24, 2018  2:18 PM Mathis Bud wrote: Reason for CRM: Patient states she needs to make a 3 week FU appt. PCP does not have anything till 9/28. Patient also has future lab orders, we made appt for 9/14 and patient would like to make her 3 week FU on same day if possible. Patient also wants to verify if she needs to fast for her labs  Patient call back [phone]

## 2018-12-25 ENCOUNTER — Other Ambulatory Visit: Payer: Self-pay | Admitting: Allergy

## 2018-12-25 ENCOUNTER — Encounter: Payer: Self-pay | Admitting: Family Medicine

## 2018-12-25 NOTE — Telephone Encounter (Signed)
Yes; ok to resume vitamins

## 2018-12-25 NOTE — Telephone Encounter (Signed)
Patient informed of the message below and appt was scheduled.  Patient wanted to know if she can begin taking her vitamins again?  Message sent to Dr Ethlyn Gallery.

## 2018-12-25 NOTE — Telephone Encounter (Signed)
Patient informed of the message below.

## 2018-12-26 ENCOUNTER — Other Ambulatory Visit: Payer: Self-pay | Admitting: Family Medicine

## 2018-12-31 ENCOUNTER — Other Ambulatory Visit: Payer: 59

## 2019-01-01 ENCOUNTER — Other Ambulatory Visit: Payer: Self-pay | Admitting: Student

## 2019-01-01 ENCOUNTER — Other Ambulatory Visit: Payer: Self-pay

## 2019-01-01 ENCOUNTER — Other Ambulatory Visit: Payer: Self-pay | Admitting: Allergy

## 2019-01-01 ENCOUNTER — Ambulatory Visit (INDEPENDENT_AMBULATORY_CARE_PROVIDER_SITE_OTHER): Payer: 59 | Admitting: Allergy

## 2019-01-01 ENCOUNTER — Encounter: Payer: Self-pay | Admitting: Allergy

## 2019-01-01 ENCOUNTER — Telehealth: Payer: Self-pay

## 2019-01-01 ENCOUNTER — Ambulatory Visit (HOSPITAL_COMMUNITY)
Admission: RE | Admit: 2019-01-01 | Discharge: 2019-01-01 | Disposition: A | Discharge status: Home / Self Care | Payer: 59 | Source: Ambulatory Visit | Attending: Allergy | Admitting: Allergy

## 2019-01-01 ENCOUNTER — Other Ambulatory Visit (HOSPITAL_COMMUNITY): Payer: Self-pay | Admitting: Allergy

## 2019-01-01 ENCOUNTER — Other Ambulatory Visit (HOSPITAL_COMMUNITY): Payer: Self-pay | Admitting: Student

## 2019-01-01 VITALS — BP 130/84 | HR 100 | Temp 98.6°F | Resp 18

## 2019-01-01 DIAGNOSIS — R0602 Shortness of breath: Secondary | ICD-10-CM | POA: Insufficient documentation

## 2019-01-01 DIAGNOSIS — Z9889 Other specified postprocedural states: Secondary | ICD-10-CM | POA: Insufficient documentation

## 2019-01-01 DIAGNOSIS — J310 Chronic rhinitis: Secondary | ICD-10-CM | POA: Diagnosis not present

## 2019-01-01 DIAGNOSIS — J4550 Severe persistent asthma, uncomplicated: Secondary | ICD-10-CM

## 2019-01-01 DIAGNOSIS — K219 Gastro-esophageal reflux disease without esophagitis: Secondary | ICD-10-CM

## 2019-01-01 DIAGNOSIS — Z9049 Acquired absence of other specified parts of digestive tract: Secondary | ICD-10-CM

## 2019-01-01 IMAGING — CT CT CHEST HIGH RESOLUTION W/O CM
2 of 5 series · 15 of 36 positions shown, 18 images · non-contrast
Comparison: [DATE] chest radiograph.

CLINICAL DATA: Shortness of breath.  Status post surgery.

EXAM:
CT CHEST WITHOUT CONTRAST
TECHNIQUE: Multidetector CT imaging of the chest was performed following the
standard protocol without intravenous contrast. High resolution
imaging of the lungs, as well as inspiratory and expiratory imaging,
was performed.

[Series 5: high resolution 2.0 b30f · axial · 0.88mm/px · z∈[+917,+1241]mm · 12 of 180 slices shown, 15 images]
[im 9/180  mediastinal]
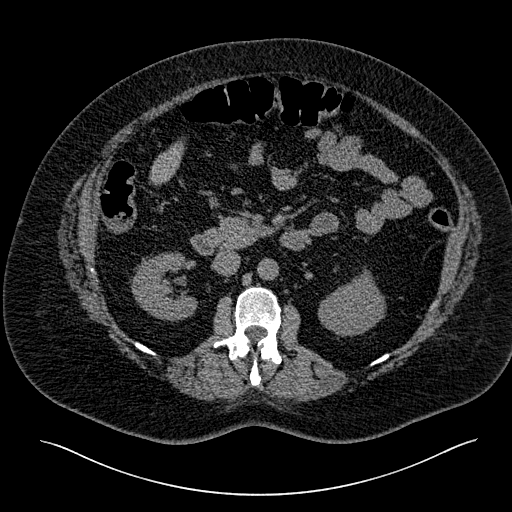
[im 9/180  lung]
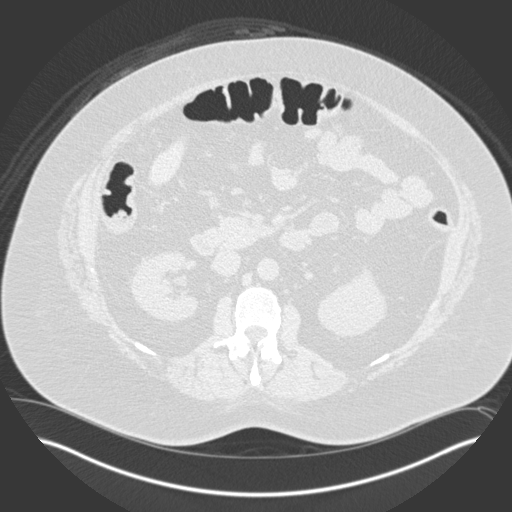
[im 25/180  lung]
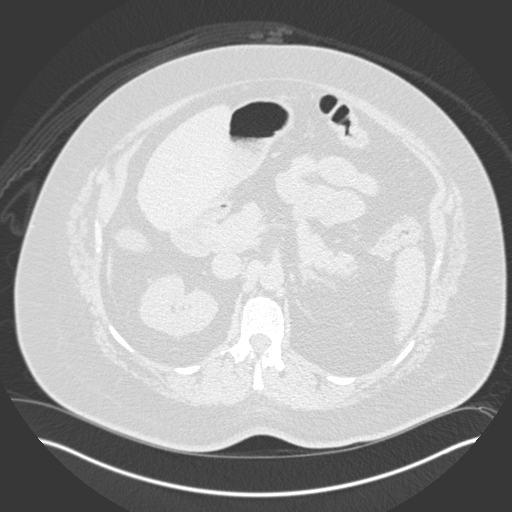
[im 41/180  lung]
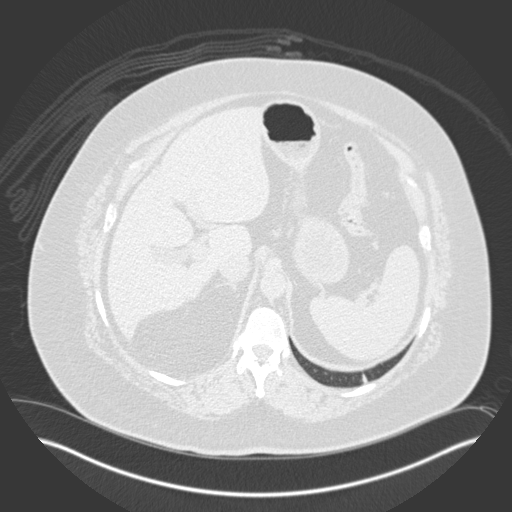
[im 57/180  lung]
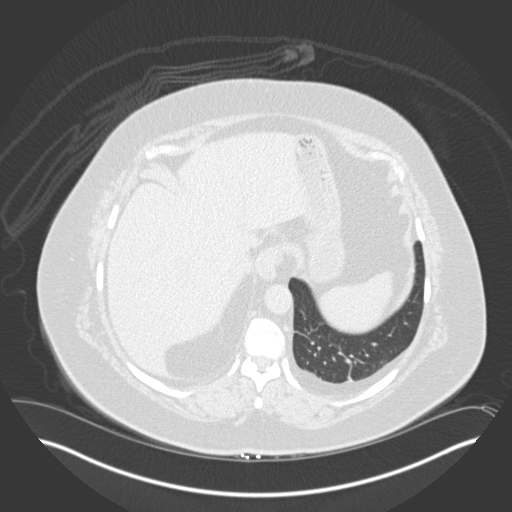
[im 66/180  mediastinal]
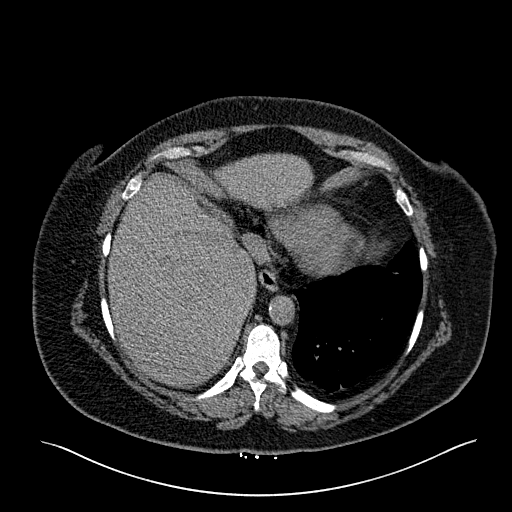
[im 66/180  lung]
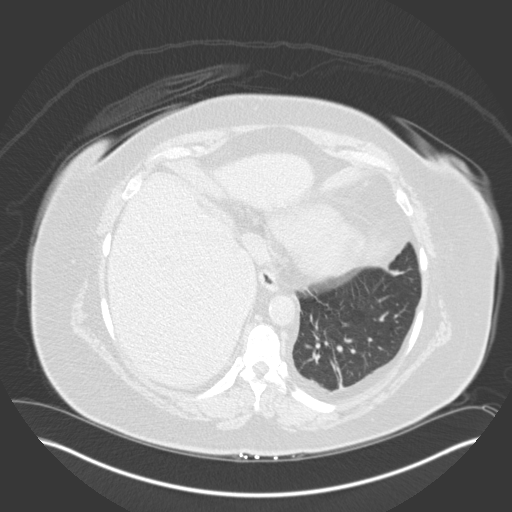
[im 82/180  lung]
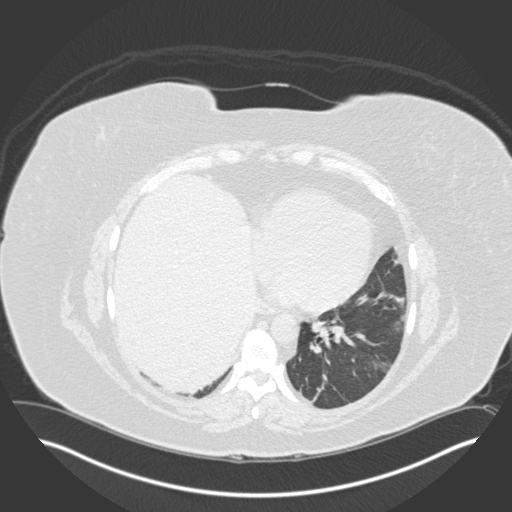
[im 98/180  lung]
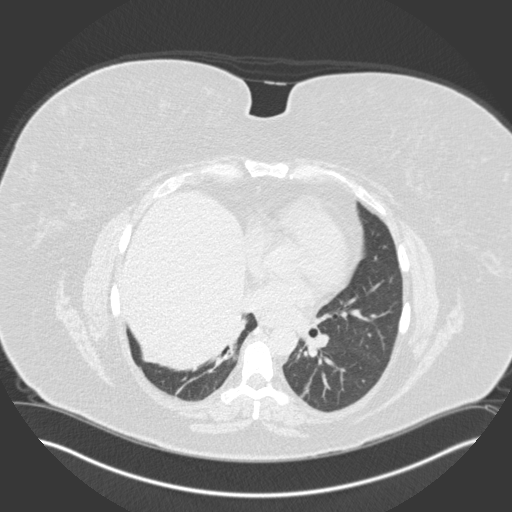
[im 114/180  lung]
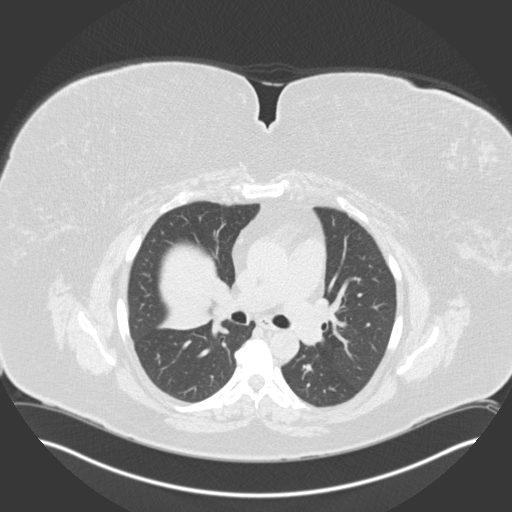
[im 123/180  mediastinal]
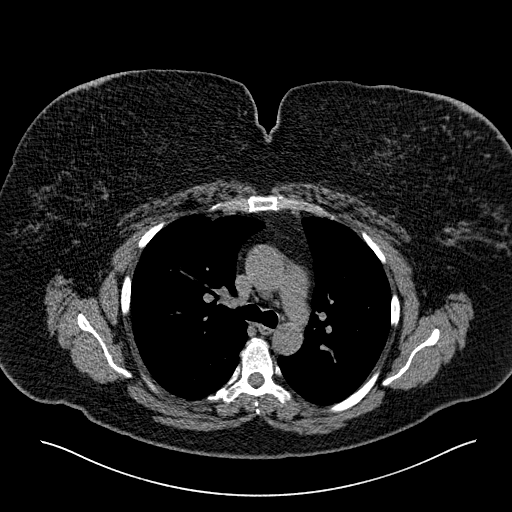
[im 123/180  lung]
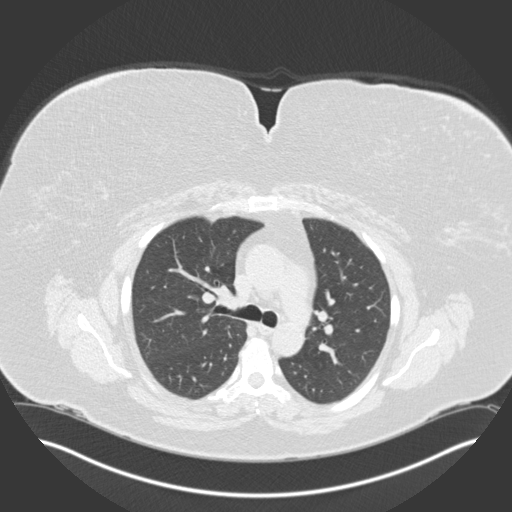
[im 139/180  lung]
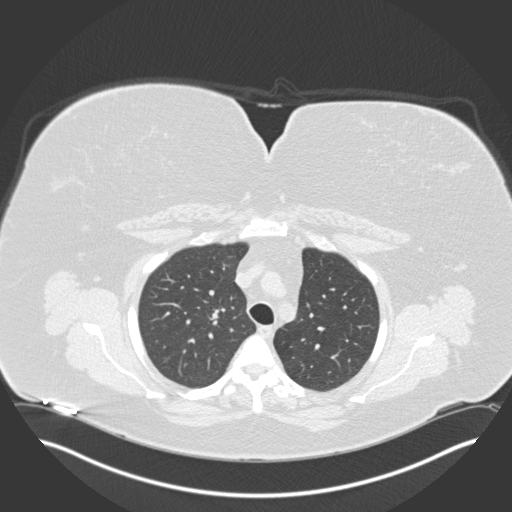
[im 155/180  lung]
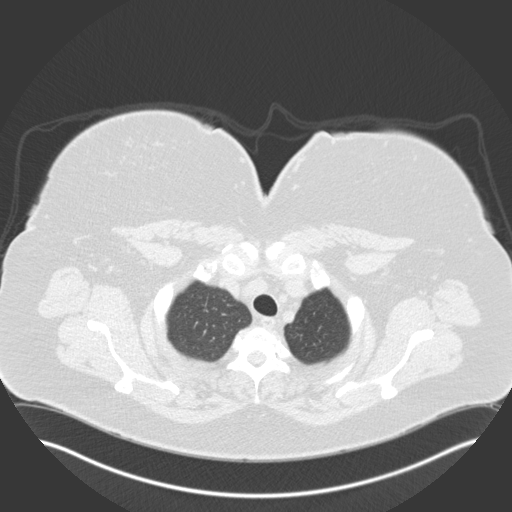
[im 171/180  lung]
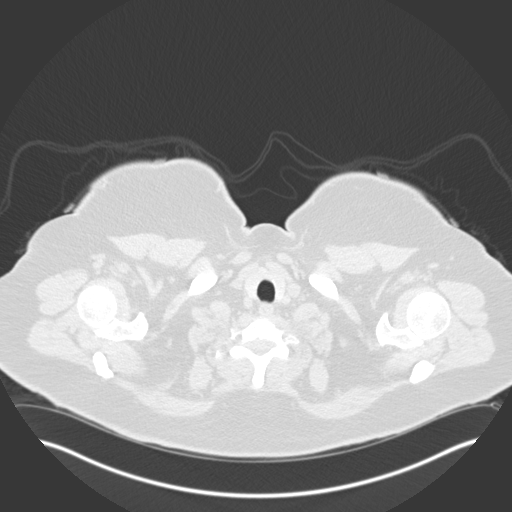

[Series 8: coronal · coronal · 0.70mm/px · 3 of 174 slices shown]
[im 35/174  lung]
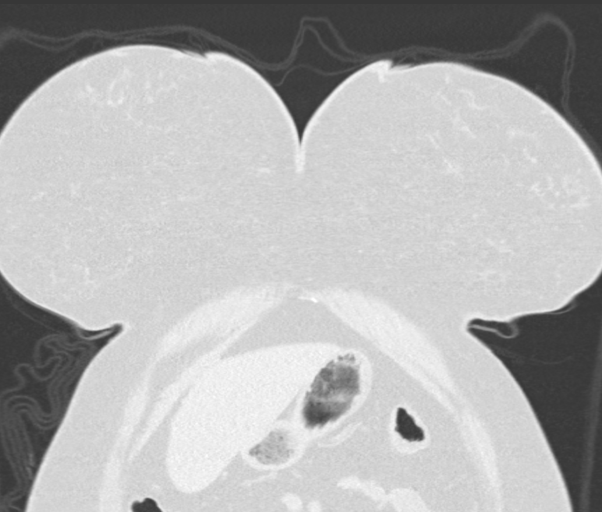
[im 70/174  lung]
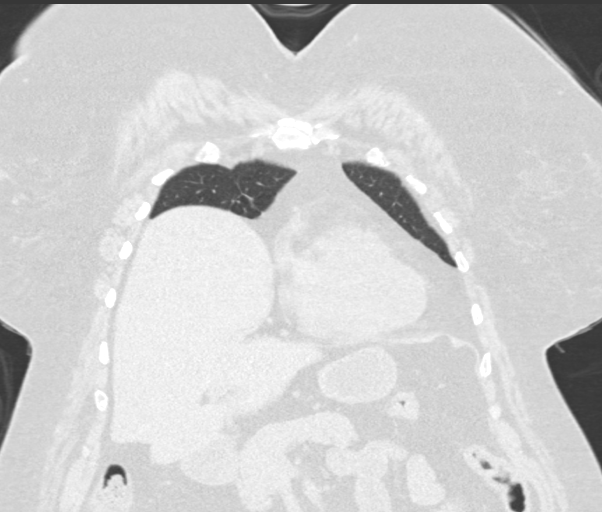
[im 104/174  lung]
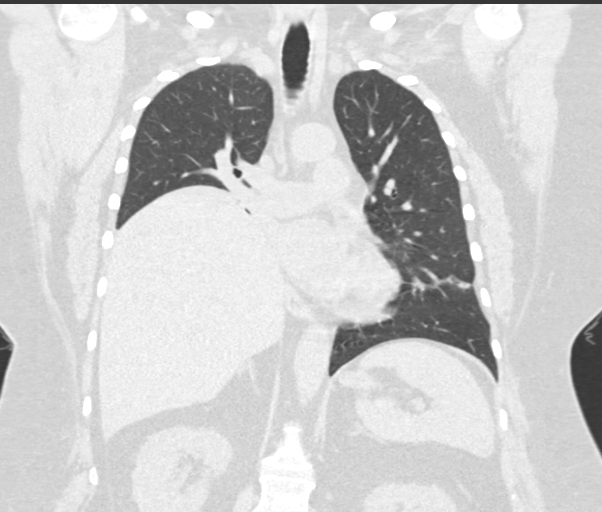

[15 of 36 positions shown; findings below may reference images not displayed]

FINDINGS: Cardiovascular: Normal heart size. No significant pericardial
effusion/thickening. Mildly atherosclerotic nonaneurysmal thoracic
aorta. Top-normal caliber main pulmonary artery (3.1 cm diameter).

Mediastinum/Nodes: No discrete thyroid nodules. Unremarkable
esophagus. No pathologically enlarged axillary, mediastinal or hilar
lymph nodes, noting limited sensitivity for the detection of hilar
adenopathy on this noncontrast study.

Lungs/Pleura: No pneumothorax. No pleural effusion. No acute
consolidative airspace disease, lung masses or significant pulmonary
nodules. There is prominent elevation of the right hemidiaphragm
with associated passive atelectasis at the right lung base including
complete right middle lobe atelectasis. A few thin scattered
parenchymal bands are present at both lung bases. No significant
regions of subpleural reticulation, ground-glass attenuation,
traction bronchiectasis, architectural distortion or frank
honeycombing. No signal air trapping on the expiration sequence.
There is evidence of tracheomalacia on the expiration sequence.

Upper abdomen: Small hiatal hernia.

Musculoskeletal: No aggressive appearing focal osseous lesions.
Moderate thoracic spondylosis.
IMPRESSION: 1. Prominent elevation of the right hemidiaphragm with associated
right lung base atelectasis including complete right middle lobe
atelectasis. Right hemidiaphragm paralysis not excluded.
2. No evidence of interstitial lung disease. Few scattered thin
parenchymal bands at the lung bases compatible with nonspecific mild
postinfectious/postinflammatory scarring.
3. Small hiatal hernia.

Aortic Atherosclerosis ([CW]-[CW]).

## 2019-01-01 NOTE — Assessment & Plan Note (Signed)
Past history - All seasonal and perennial aeroallergen skin tests are negative despite a positive histamine control.  Tried Flonase, Xhance, Astelin, Xyzal, Periactin, and Claritin without adequate symptom relief.  Interm history - asymptomatic without medications. Usually flares in the fall.   May use Flonase + Astelin 1 spray twice a day as needed.  Nasal saline spray (i.e., Simply Saline) or nasal saline lavage (i.e., NeilMed) is recommended as needed and prior to medicated nasal sprays.

## 2019-01-01 NOTE — Assessment & Plan Note (Signed)
Controlled with Protonix.  °· Continue pantoprazole (Protonix) as prescribed. °· Continue reflux lifestyle modifications and diet.  °

## 2019-01-01 NOTE — Assessment & Plan Note (Addendum)
Patient has been having worsening shortness of breath with drop in oxygenation with ambulation since appendectomy last month. Currently on oxygen. She also had some fevers with tmax of 100.5 at home. COVID testing was negative while in the hospital. No CT chest to rule out PE done yet. She has h/o diaphragm issues in the past.   Given clinical history, concerned for PE s/p surgery.   Ordered CT chest and surgeon apparently ordered CT abdomen for her right sided abdominal pain.   Patient was sent directly to the radiology department from our office.   Follow up with surgeon and PCP regarding the pain and oxygenation.  They do have pulse ox at home.

## 2019-01-01 NOTE — Telephone Encounter (Signed)
Insurance certification not required for CT of chest. Patient is okay to continue will CT of chest that was ordered by Dr. Maudie Mercury.

## 2019-01-01 NOTE — Assessment & Plan Note (Signed)
Interim history - start on [place] in m=May 2020 with no issues. She stopped BID dose of Symbicort and stopped Spiriva completely in the summer. Tried to restart Spiriva but having coughing episodes afterwards.   I did not order spirometry today due to acute pain and recent surgery.  . Daily controller medication(s): increase Symbicort 160 2 puffs twice a day with spacer and rinse mouth afterwards. o Continue fasenra injections. o Stop Spiriva for now.  . Prior to physical activity: May use albuterol rescue inhaler 2 puffs 5 to 15 minutes prior to strenuous physical activities. Marland Kitchen Rescue medications: May use albuterol rescue inhaler 2 puffs or nebulizer every 4 to 6 hours as needed for shortness of breath, chest tightness, coughing, and wheezing. Monitor frequency of use.  . During upper respiratory infections/asthma flares: Start asmanex 200 1 puff twice a day with spacer and rinse mouth afterwards for 1 week.

## 2019-01-01 NOTE — Progress Notes (Signed)
Follow Up Note  RE: Alyssa Clay MRN: [id number] DOB: 18-Apr-1966 Date of Office Visit: 01/01/2019  Referring provider: Caren Macadam, MD Primary care provider: Caren Macadam, MD  Chief Complaint: Asthma  History of Present Illness: I had the pleasure of seeing Alyssa Clay for a follow up visit at the [facility] on 01/01/2019. She is a 53 y.o. female, who is being followed for asthma, chronic rhinitis and GERD. Today she is here for new complaint of trouble breathing. Her previous allergy office visit was on 04/22/2018 with Dr. Maudie Mercury. Patient is here today with her husband.  Patient had appendectomy 3 weeks ago but she is still having pain on the right upper quadrant now.  She is having some low grade fevers and surgeon ordered a CT abdomen.   While hospitalized, patient had issues with pain and the pain medications caused low oxygenation and was discharged with oxygen. Her oxygen level at rest is at 94-95% but with ambulation it dips down to 88%.   She has history of partially paralyzed diaphragm. This is the first time she ever needed oxygen supplementation as outpatient.    Not well controlled severe persistent asthma Some wheezing when in the shower and with ambulation.   Currently on Symbicort 160 2 puffs QD, Singulair daily.  Stopped Spiriva in the summer but now trying to restart but always ends up coughing after each use.   Breathing was well controlled before the hospitalization and she decreased her Symbicort to once a day in the summer.   Started on [place] and received 2 injections to date. She thinks it's helping her.   Chronic rhinitis Doing well with no nasal sprays.   Gastroesophageal reflux disease Controlled with Protonix   Assessment and Plan: [name] is a 53 y.o. female with: Shortness of breath Patient has been having worsening shortness of breath with drop in oxygenation with ambulation since appendectomy last  month. Currently on oxygen. She also had some fevers with tmax of 100.5 at home. COVID testing was negative while in the hospital. No CT chest to rule out PE done yet. She has h/o diaphragm issues in the past.   Given clinical history, concerned for PE s/p surgery.   Ordered CT chest and surgeon apparently ordered CT abdomen for her right sided abdominal pain.   Patient was sent directly to the radiology department from our office.   Follow up with surgeon and PCP regarding the pain and oxygenation.  They do have pulse ox at home.  Not well controlled severe persistent asthma Interim history - start on Fasenra in m=May 2020 with no issues. She stopped BID dose of Symbicort and stopped Spiriva completely in the summer. Tried to restart Spiriva but having coughing episodes afterwards.   I did not order spirometry today due to acute pain and recent surgery.   Daily controller medication(s): increase Symbicort 160 2 puffs twice a day with spacer and rinse mouth afterwards. o Continue fasenra injections. o Stop Spiriva for now.   Prior to physical activity: May use albuterol rescue inhaler 2 puffs 5 to 15 minutes prior to strenuous physical activities.  Rescue medications: May use albuterol rescue inhaler 2 puffs or nebulizer every 4 to 6 hours as needed for shortness of breath, chest tightness, coughing, and wheezing. Monitor frequency of use.   During upper respiratory infections/asthma flares: Start asmanex 200 1 puff twice a day with spacer and rinse mouth afterwards for 1 week.  Chronic  rhinitis Past history - All seasonal and perennial aeroallergen skin tests are negative despite a positive histamine control.  Tried Flonase, Xhance, Astelin, Xyzal, Periactin, and Claritin without adequate symptom relief.  Interm history - asymptomatic without medications. Usually flares in the fall.   May use Flonase + Astelin 1 spray twice a day as needed.  Nasal saline spray (i.e., Simply Saline)  or nasal saline lavage (i.e., NeilMed) is recommended as needed and prior to medicated nasal sprays.  Gastroesophageal reflux disease Controlled with Protonix.   Continue pantoprazole (Protonix) as prescribed.  Continue reflux lifestyle modifications and diet.   Return in about 4 weeks (around 01/29/2019).  Diagnostics: None.  Medication List:  Current Outpatient Medications  Medication Sig Dispense Refill   AIMOVIG 140 MG/ML SOAJ Inject 140 mg into the skin every 30 (thirty) days.     albuterol (PROVENTIL HFA;VENTOLIN HFA) 108 (90 Base) MCG/ACT inhaler Inhale 2 puffs into the lungs every 6 (six) hours as needed for wheezing or shortness of breath. 1 Inhaler 1   budesonide-formoterol (SYMBICORT) 160-4.5 MCG/ACT inhaler Inhale 2 puffs into the lungs 2 (two) times daily. 1 Inhaler 5   calcium carbonate (OSCAL) 1500 (600 Ca) MG TABS tablet Take 600 mg of elemental calcium by mouth 2 (two) times daily with a meal.     cyclobenzaprine (FLEXERIL) 10 MG tablet Take 10 mg by mouth 3 (three) times daily as needed (migraines).     DULoxetine (CYMBALTA) 60 MG capsule TAKE 1 CAPSULE BY MOUTH EVERY DAY (Patient taking differently: Take 60 mg by mouth daily. ) 30 capsule 5   Erenumab-aooe (AIMOVIG) 140 MG/ML SOAJ Inject into the skin.     fluticasone (FLONASE) 50 MCG/ACT nasal spray Place 1 spray into both nostrils 2 (two) times daily. 16 g 5   gabapentin (NEURONTIN) 300 MG capsule Take 300-900 mg by mouth See admin instructions. Take 1 tablet every AM, three tablets at bedtime     losartan (COZAAR) 50 MG tablet Take 1 tablet (50 mg total) by mouth daily. 90 tablet 0   montelukast (SINGULAIR) 10 MG tablet TAKE 1 TABLET BY MOUTH EVERY DAY 30 tablet 0   Multiple Vitamin (MULTIVITAMIN WITH MINERALS) TABS tablet Take 1 tablet by mouth daily.     pantoprazole (PROTONIX) 40 MG tablet Take 1 tablet (40 mg total) by mouth 2 (two) times daily before a meal. (Patient taking differently: Take 40 mg  by mouth daily. ) 30 tablet 5   polyethylene glycol (MIRALAX / GLYCOLAX) 17 g packet Take 17 g by mouth daily. 14 each 0   rizatriptan (MAXALT) 10 MG tablet Take 10 mg by mouth as needed for migraine. May repeat in 2 hours if needed     traMADol (ULTRAM) 50 MG tablet Take 50 mg by mouth every 6 (six) hours as needed.     traZODone (DESYREL) 100 MG tablet Take 1-2 tablets (100-200 mg total) by mouth at bedtime as needed for sleep. 180 tablet 1   albuterol (PROVENTIL) (2.5 MG/3ML) 0.083% nebulizer solution Take 2.5 mg by nebulization every 6 (six) hours as needed for wheezing or shortness of breath.     FASENRA 30 MG/ML SOSY SECOND SHIP: INJECT ONE SYRINGE UNDER THE SKIN AT WEEK 4 AND 8, THEN EVERY 8 WEEKS THEREAFTER. (Patient taking differently: Inject 30 mg into the skin See admin instructions. Every 8 weeks) 1 Syringe 8   meloxicam (MOBIC) 7.5 MG tablet Take 1 tablet (7.5 mg total) by mouth daily. (Patient not taking: Reported on  01/01/2019) 30 tablet 0   methocarbamol (ROBAXIN) 500 MG tablet Take 1 tablet (500 mg total) by mouth 2 (two) times daily as needed for muscle spasms. (Patient not taking: Reported on 01/01/2019) 30 tablet 0   valACYclovir (VALTREX) 1000 MG tablet Take 1 tablet (1,000 mg total) by mouth 2 (two) times daily. (Patient taking differently: Take 1,000 mg by mouth 2 (two) times daily as needed (outbreak). ) 18 tablet 5   No current facility-administered medications for this visit.    Allergies: No Known Allergies I reviewed her past medical history, social history, family history, and environmental history and no significant changes have been reported from previous visit on 04/22/2018.  Review of Systems  Constitutional: Positive for fever. Negative for appetite change, chills and unexpected weight change.  HENT: Negative for congestion, postnasal drip and rhinorrhea.   Eyes: Negative for itching.  Respiratory: Positive for cough and shortness of breath. Negative for  chest tightness and wheezing.   Gastrointestinal: Positive for abdominal pain.  Skin: Negative for rash.  Allergic/Immunologic: Negative for environmental allergies.  Neurological: Positive for headaches.   Objective: BP 130/84 (BP Location: Left Arm, Patient Position: Sitting, Cuff Size: Normal)    Pulse 100    Temp 98.6 F (37 C) (Temporal)    Resp 18    SpO2 97% Comment: On 1 liter of Oxygen There is no height or weight on file to calculate BMI. Physical Exam  Constitutional: She is oriented to person, place, and time. She appears well-developed and well-nourished.  HENT:  Head: Normocephalic and atraumatic.  Right Ear: External ear normal.  Left Ear: External ear normal.  Nose: Nose normal.  Mouth/Throat: Oropharynx is clear and moist.  Eyes: Conjunctivae and EOM are normal.  Neck: Neck supple.  Cardiovascular: Normal rate, regular rhythm and normal heart sounds. Exam reveals no gallop and no friction rub.  No murmur heard. Pulmonary/Chest: Effort normal and breath sounds normal. She has no wheezes. She has no rales.  Neurological: She is alert and oriented to person, place, and time.  Skin: Skin is warm. No rash noted.  Psychiatric: She has a normal mood and affect. Her behavior is normal.  Nursing note and vitals reviewed.  Previous notes and tests were reviewed. The plan was reviewed with the patient/family, and all questions/concerned were addressed.  It was my pleasure to see Chloris today and participate in her care. Please feel free to contact me with any questions or concerns.  Sincerely,  Rexene Alberts, DO Allergy & Immunology  [facility] office: [phone] [facility] office: [place] office: [phone]

## 2019-01-01 NOTE — Patient Instructions (Addendum)
Follow up with your surgeon regarding the pain and oxygen.  Check your oxygen levels at home.   . Daily controller medication(s): increase Symbicort 160 2 puffs twice a day with spacer and rinse mouth afterwards. . Continue fasenra injections. . Prior to physical activity: May use albuterol rescue inhaler 2 puffs 5 to 15 minutes prior to strenuous physical activities. Marland Kitchen Rescue medications: May use albuterol rescue inhaler 2 puffs or nebulizer every 4 to 6 hours as needed for shortness of breath, chest tightness, coughing, and wheezing. Monitor frequency of use.  . During upper respiratory infections/asthma flares: Start asmanex 200 1 puff twice a day with spacer and rinse mouth afterwards for 1 week. . Asthma control goals:  o Full participation in all desired activities (may need albuterol before activity) o Albuterol use two times or less a week on average (not counting use with activity) o Cough interfering with sleep two times or less a month o Oral steroids no more than once a year o No hospitalizations  Follow up in 1 month

## 2019-01-02 ENCOUNTER — Ambulatory Visit (HOSPITAL_COMMUNITY): Payer: 59

## 2019-01-02 ENCOUNTER — Encounter (HOSPITAL_COMMUNITY): Payer: Self-pay

## 2019-01-02 ENCOUNTER — Telehealth: Payer: Self-pay

## 2019-01-02 ENCOUNTER — Ambulatory Visit (HOSPITAL_COMMUNITY)
Admission: RE | Admit: 2019-01-02 | Discharge: 2019-01-02 | Disposition: A | Discharge status: Home / Self Care | Payer: 59 | Source: Ambulatory Visit | Attending: Allergy | Admitting: Allergy

## 2019-01-02 DIAGNOSIS — Z9889 Other specified postprocedural states: Secondary | ICD-10-CM | POA: Insufficient documentation

## 2019-01-02 DIAGNOSIS — Z9049 Acquired absence of other specified parts of digestive tract: Secondary | ICD-10-CM | POA: Diagnosis present

## 2019-01-02 DIAGNOSIS — R0602 Shortness of breath: Secondary | ICD-10-CM | POA: Insufficient documentation

## 2019-01-02 IMAGING — CT CT ABD-PELV W/ CM
2 of 5 series · 17 of 46 positions shown, 19 images · IV contrast (APPLIED)
Comparison: CT abdomen pelvis, [DATE]

CLINICAL DATA: Status post appendectomy, pain in the right upper
quadrant

EXAM:
CT ABDOMEN AND PELVIS WITH CONTRAST
TECHNIQUE: Multidetector CT imaging of the abdomen and pelvis was performed
using the standard protocol following bolus administration of
intravenous contrast.
CONTRAST:  100mL OMNIPAQUE IOHEXOL 350 MG/ML SOLN, additional oral
enteric contrast

[Series 3: abd/ pelvis 5.0 i30f 2 · axial · 0.98mm/px · z∈[+667,+1192]mm · 14 of 120 slices shown, 16 images]
[im 8/120  soft-tissue]
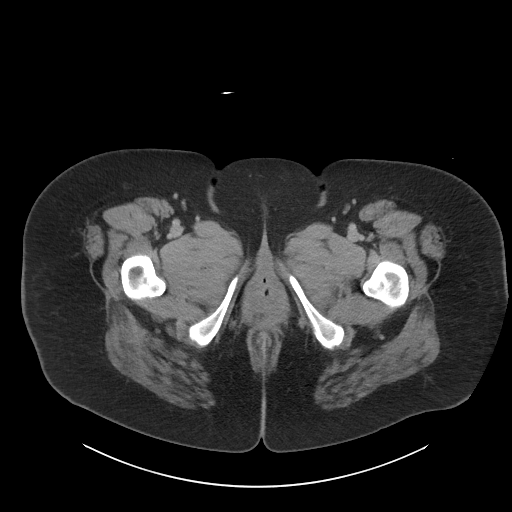
[im 8/120  bone]
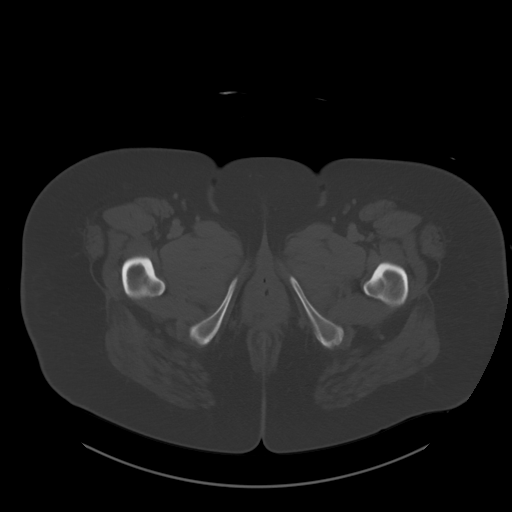
[im 15/120  soft-tissue]
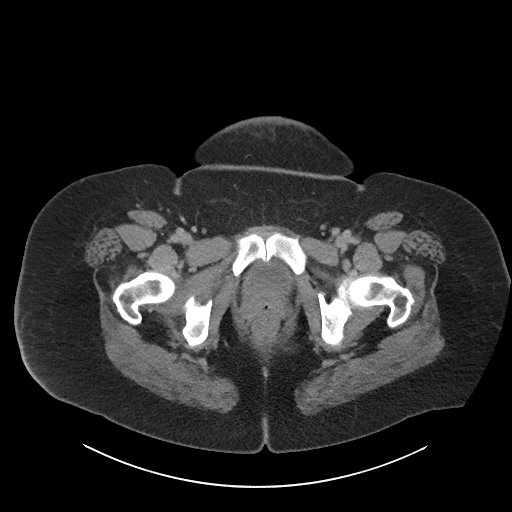
[im 22/120  soft-tissue]
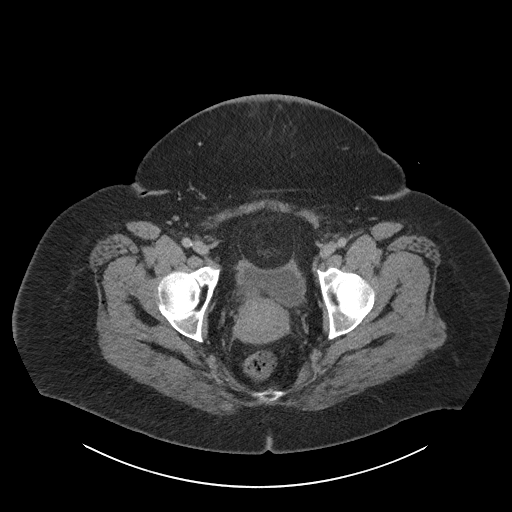
[im 36/120  soft-tissue]
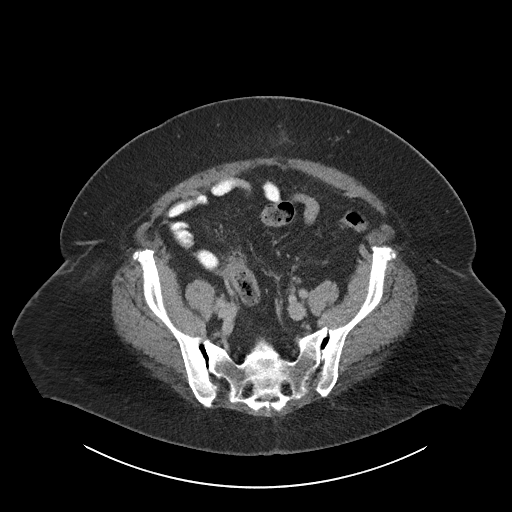
[im 43/120  soft-tissue]
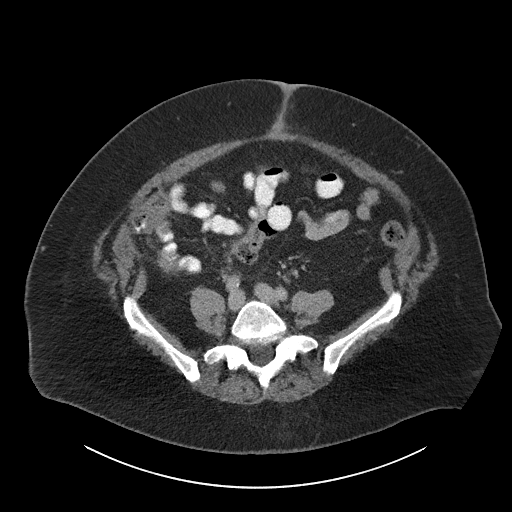
[im 50/120  soft-tissue]
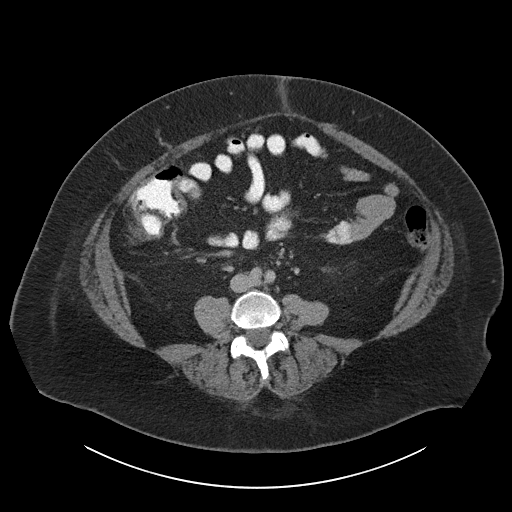
[im 57/120  soft-tissue]
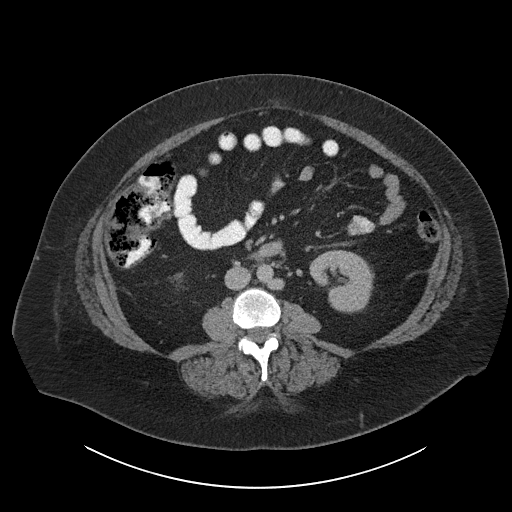
[im 64/120  soft-tissue]
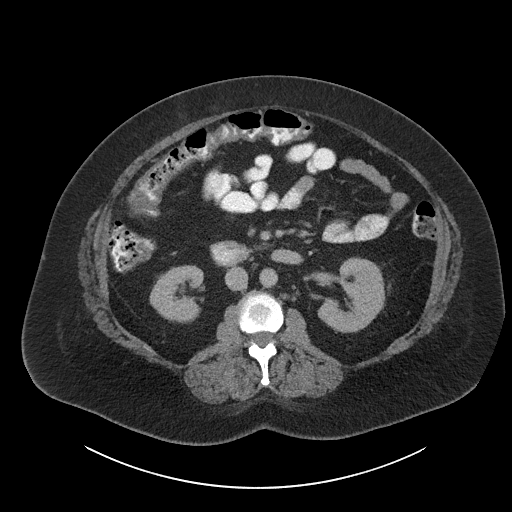
[im 71/120  soft-tissue]
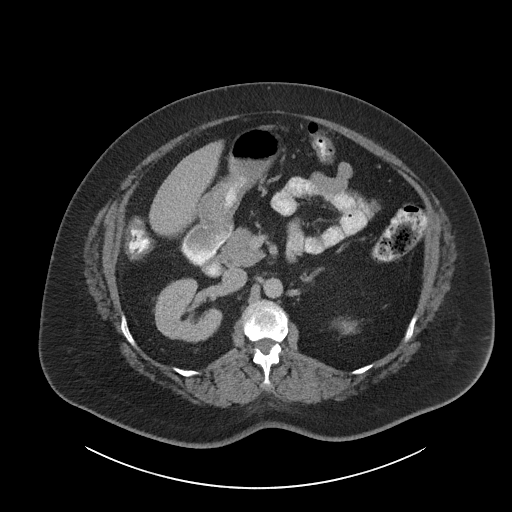
[im 71/120  bone]
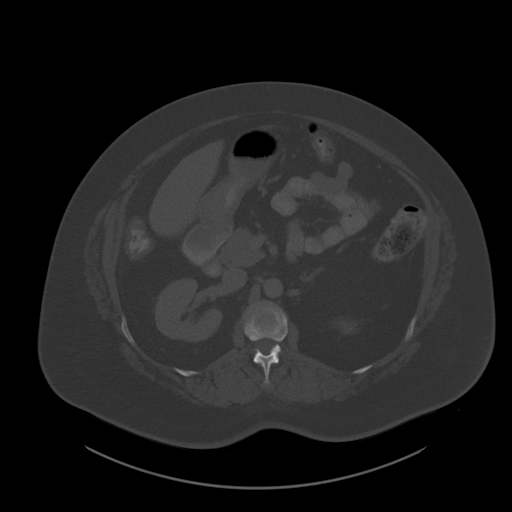
[im 78/120  soft-tissue]
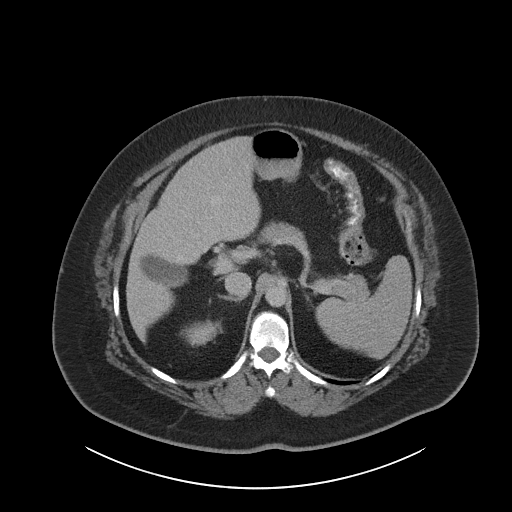
[im 92/120  soft-tissue]
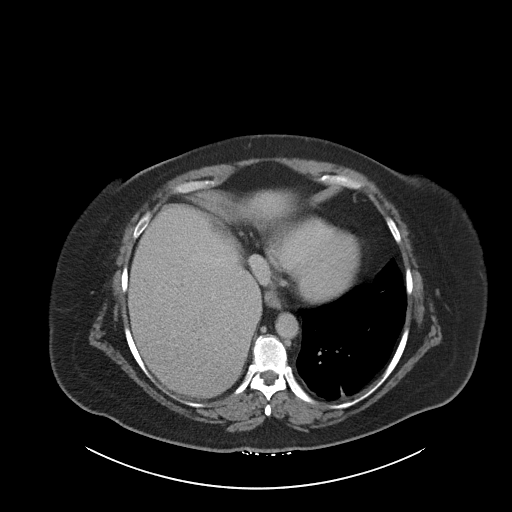
[im 99/120  soft-tissue]
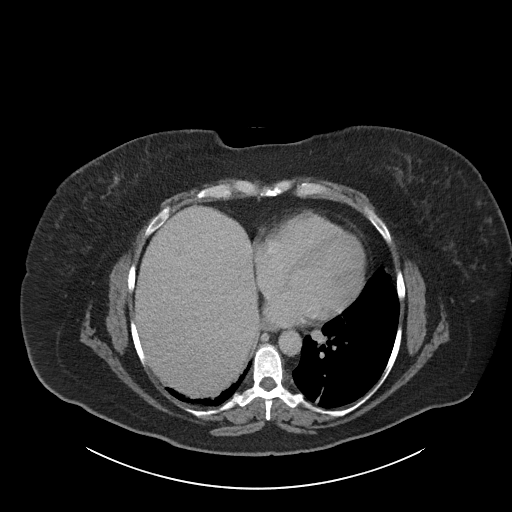
[im 106/120  soft-tissue]
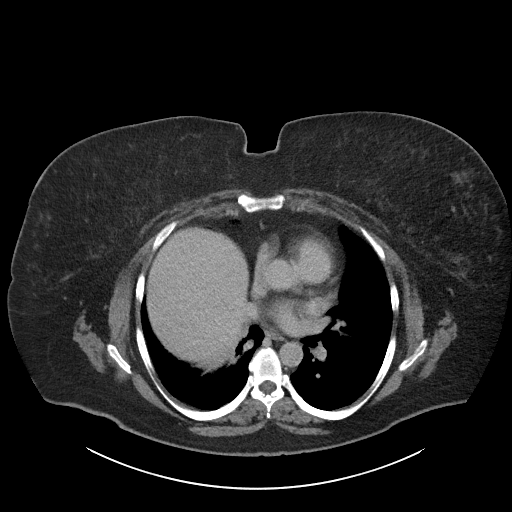
[im 113/120  soft-tissue]
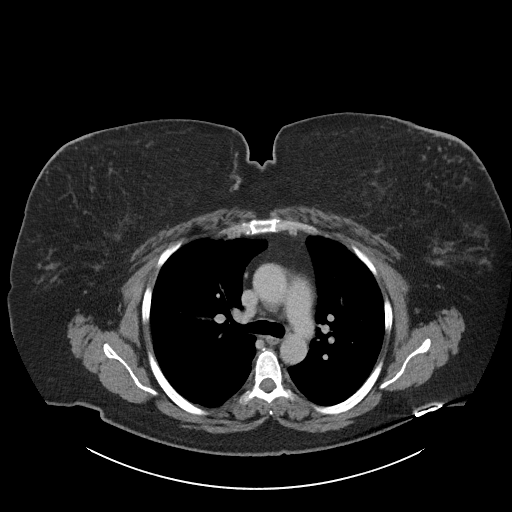

[Series 6: coronal soft tissue · coronal · 1.12mm/px · 3 of 135 slices shown]
[im 45/135  soft-tissue]
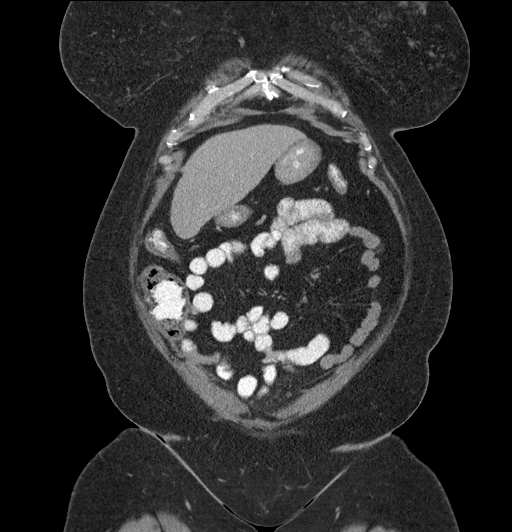
[im 60/135  soft-tissue]
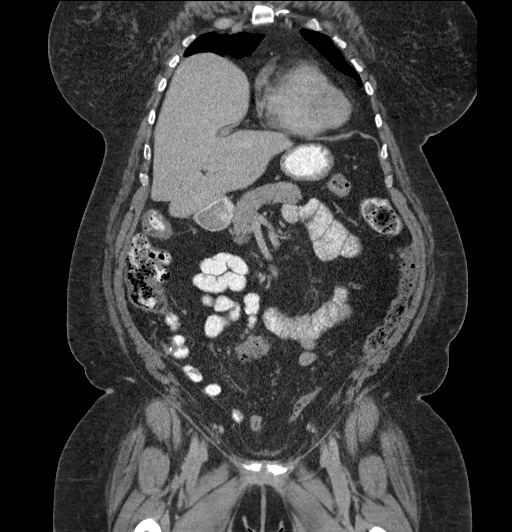
[im 75/135  soft-tissue]
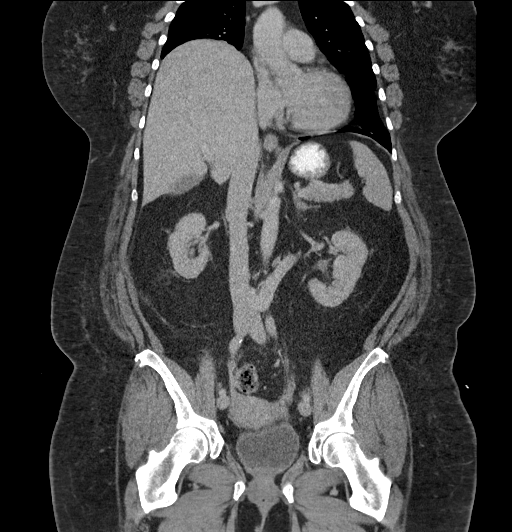

[17 of 46 positions shown; findings below may reference images not displayed]

FINDINGS: Lower chest: Please see separately dictated examination of the
chest.

Hepatobiliary: No solid liver abnormality is seen. No gallstones,
gallbladder wall thickening, or biliary dilatation.

Pancreas: Unremarkable. No pancreatic ductal dilatation or
surrounding inflammatory changes.

Spleen: Normal in size without significant abnormality.

Adrenals/Urinary Tract: Adrenal glands are unremarkable. Kidneys are
normal, without renal calculi, solid lesion, or hydronephrosis.
Bladder is unremarkable.

Stomach/Bowel: Stomach is within normal limits. Status post
appendectomy. No evidence of bowel wall thickening, distention, or
inflammatory changes.

Vascular/Lymphatic: Aortic atherosclerosis. No enlarged abdominal or
pelvic lymph nodes.

Reproductive: No mass or other significant abnormality. IUD is
present in the endometrial cavity.

Other: No abdominal wall hernia or abnormality. No abdominopelvic
ascites.

Musculoskeletal: No acute or significant osseous findings.
IMPRESSION: Status post appendectomy. No evidence of postoperative complication
or other findings of the abdomen or pelvis to explain right-sided
abdominal pain.

## 2019-01-02 IMAGING — CT CT ANGIO CHEST
2 of 6 series · 18 of 46 positions shown · IV contrast (omnipaque)
Comparison: CT chest, [DATE]

CLINICAL DATA: PE suspected, recent appendectomy

EXAM:
CT ANGIOGRAPHY CHEST WITH CONTRAST
TECHNIQUE: Multidetector CT imaging of the chest was performed using the
standard protocol during bolus administration of intravenous
contrast. Multiplanar CT image reconstructions and MIPs were
obtained to evaluate the vascular anatomy.
CONTRAST:  100mL OMNIPAQUE IOHEXOL 350 MG/ML SOLN

[Series 6: thins · axial · 0.88mm/px · z∈[+1050,+1293]mm · 15 of 267 slices shown]
[im 12/267  lung]
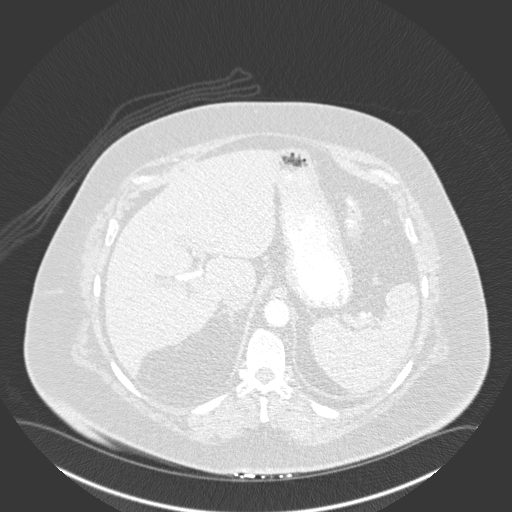
[im 35/267  soft-tissue]
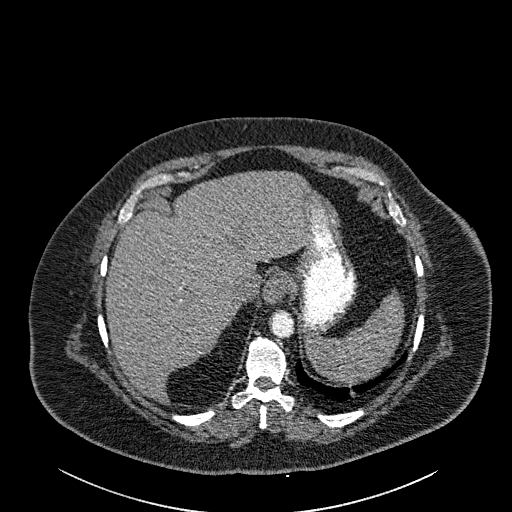
[im 47/267  lung]
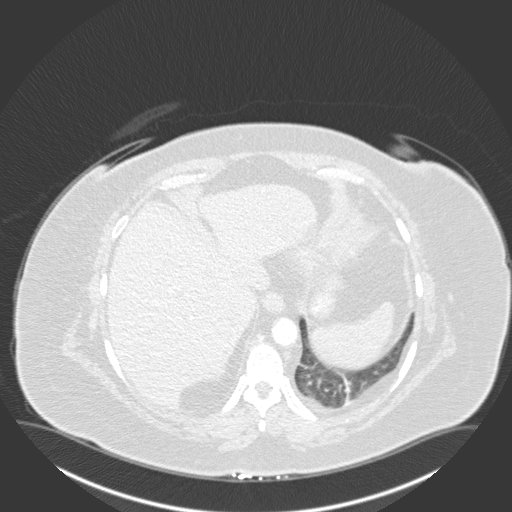
[im 70/267  soft-tissue]
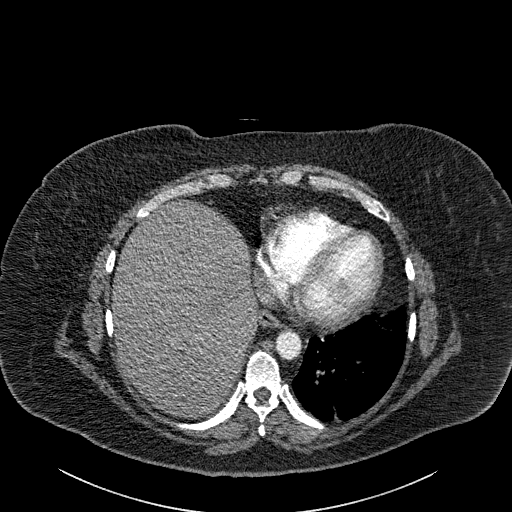
[im 81/267  lung]
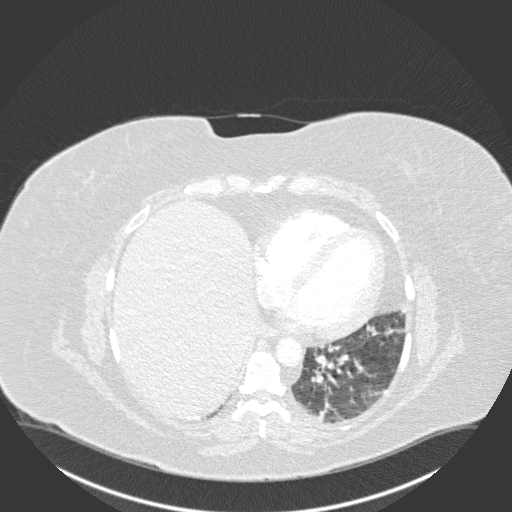
[im 105/267  soft-tissue]
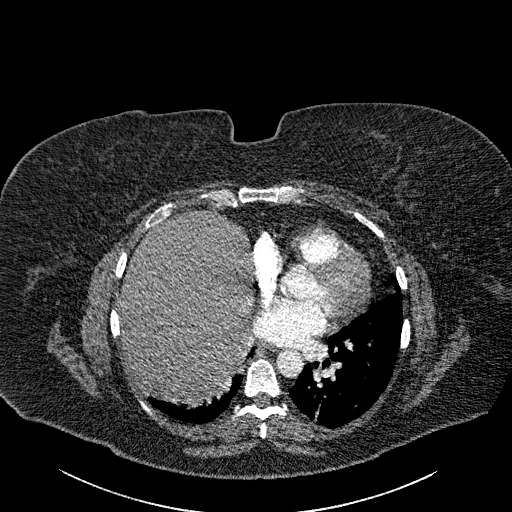
[im 116/267  lung]
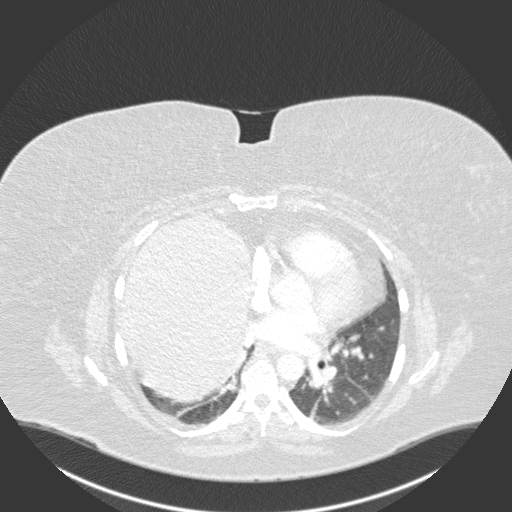
[im 139/267  soft-tissue]
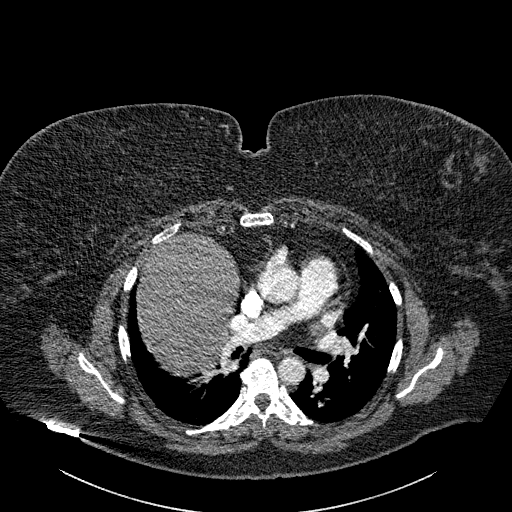
[im 151/267  lung]
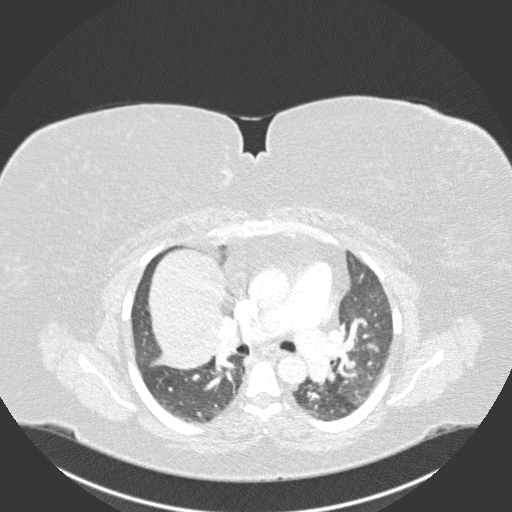
[im 162/267  soft-tissue]
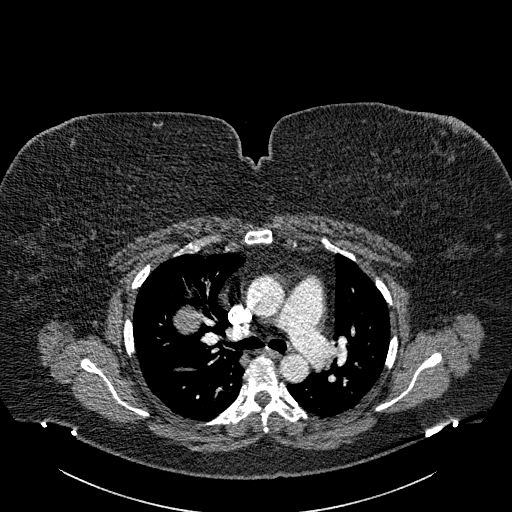
[im 186/267  lung]
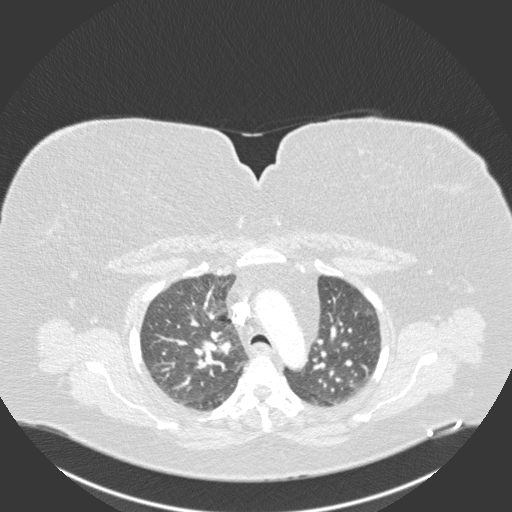
[im 197/267  soft-tissue]
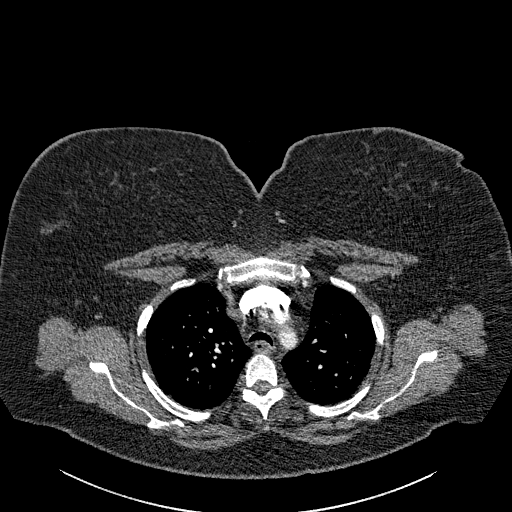
[im 220/267  lung]
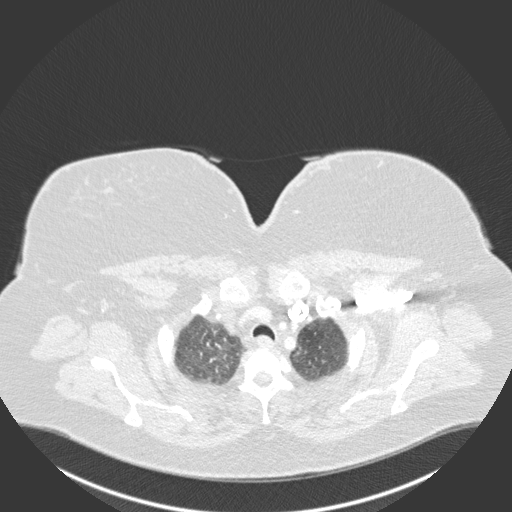
[im 232/267  soft-tissue]
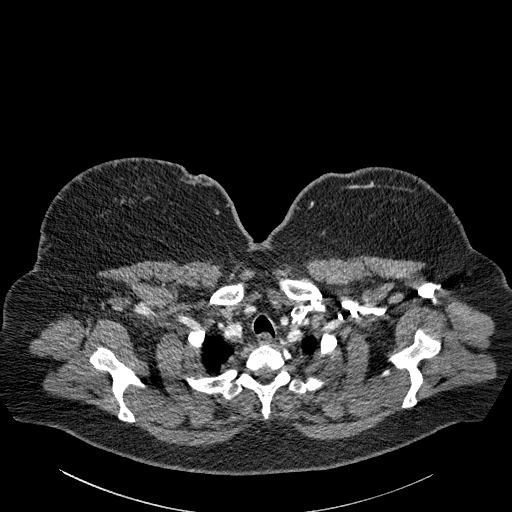
[im 255/267  lung]
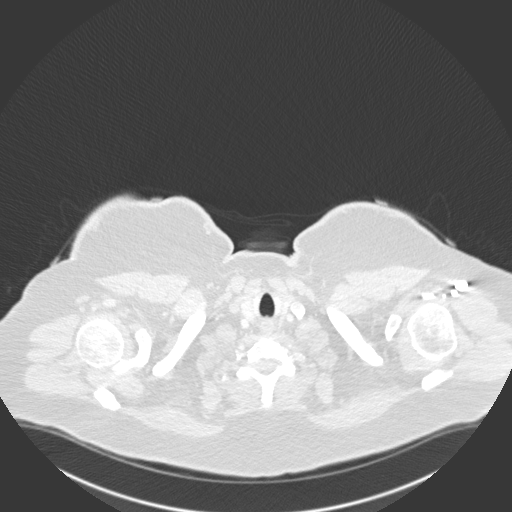

[Series 8: coronal mpr · coronal · 0.55mm/px · 3 of 163 slices shown]
[im 41/163  soft-tissue]
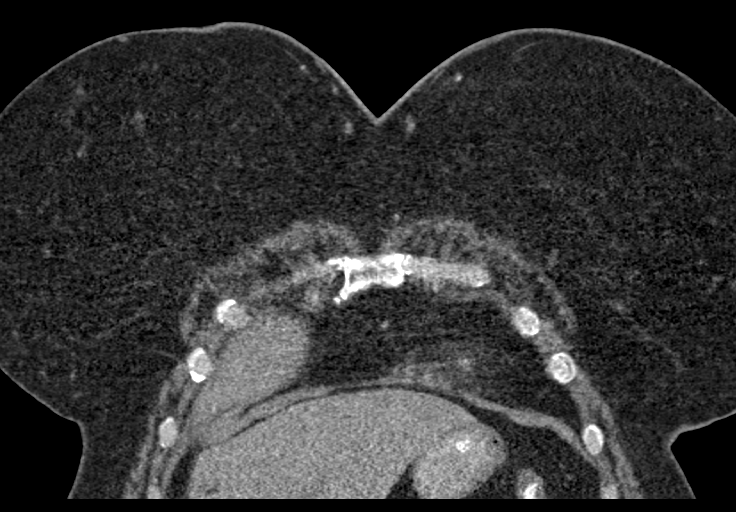
[im 82/163  soft-tissue]
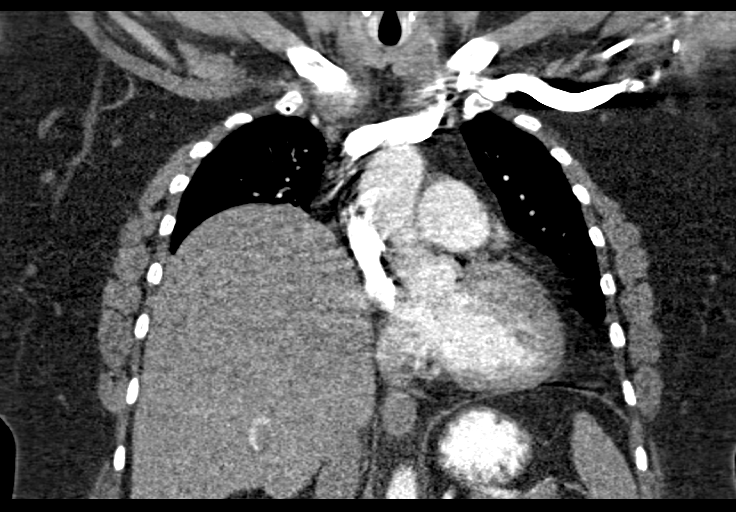
[im 122/163  soft-tissue]
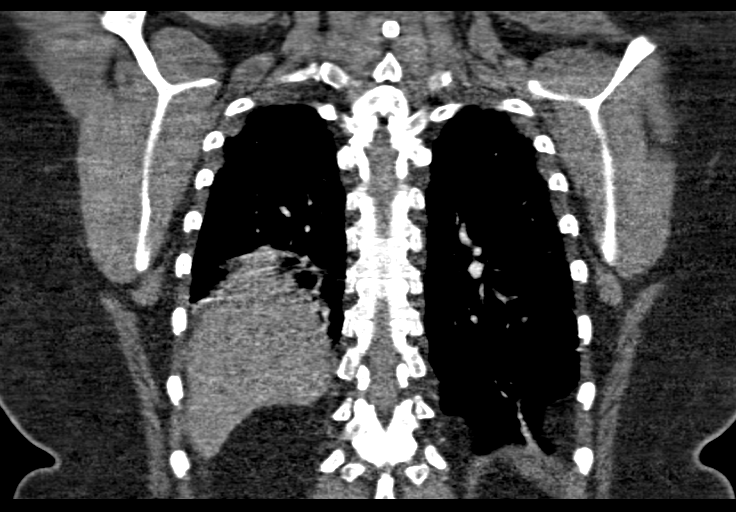

[18 of 46 positions shown; findings below may reference images not displayed]

FINDINGS: Cardiovascular: Evaluation for pulmonary embolism is limited by body
habitus and substantial atelectasis of the inferior right lung.
Within this limitation, no evidence of pulmonary embolism. Normal
heart size. No pericardial effusion.

Mediastinum/Nodes: No enlarged mediastinal, hilar, or axillary lymph
nodes. Thyroid gland, trachea, and esophagus demonstrate no
significant findings.

Lungs/Pleura: Marked elevation of the right hemidiaphragm with
atelectasis of the right lower and right middle lobes. Bandlike
scarring or atelectasis of the dependent left lower lobe. No pleural
effusion or pneumothorax.

Upper Abdomen: Please see separately dictated CT examination of the
abdomen and pelvis.

Musculoskeletal: No chest wall abnormality. No acute or significant
osseous findings.

Review of the MIP images confirms the above findings.
IMPRESSION: 1. Evaluation for pulmonary embolism is limited by body habitus and
substantial atelectasis of the inferior right lung. Within this
limitation, no evidence of pulmonary embolism.

2. Marked elevation of the right hemidiaphragm with atelectasis of
the right lower and right middle lobes. Bandlike scarring or
atelectasis of the dependent left lower lobe.

## 2019-01-02 MED ORDER — IOHEXOL 350 MG/ML SOLN
100.0000 mL | Freq: Once | INTRAVENOUS | Status: AC | PRN
  Administered 2019-01-02: 100 mL via INTRAVENOUS

## 2019-01-02 NOTE — Telephone Encounter (Signed)
Radiology department called to request change of CT chest w contrast to rule out a PE to a straight PE study. I spoke with Dr. Maudie Mercury and she gave verbal authorization to do this. I let radiology know to go ahead and change per Dr. Julianne Rice instructions. Dr. Maudie Mercury also told me that the CT Chest High Resolution w/o contrast that was performed was incorrect. I made sure that the radiology staff member knew this. I also let her know that none of our clinical staff members placed the order. She did let me know that she would contact the finance dept in radiology so that the patient would not receive any charges. I stressed the importance of the need for the study to rule out the PE as well as the the ct scan that was ordered by the patient's surgeon.

## 2019-01-03 ENCOUNTER — Encounter: Payer: Self-pay | Admitting: Family Medicine

## 2019-01-03 ENCOUNTER — Other Ambulatory Visit: Payer: Self-pay

## 2019-01-03 ENCOUNTER — Encounter: Payer: Self-pay | Admitting: Allergy

## 2019-01-03 ENCOUNTER — Other Ambulatory Visit: Payer: Self-pay | Admitting: Family Medicine

## 2019-01-03 DIAGNOSIS — R0902 Hypoxemia: Secondary | ICD-10-CM

## 2019-01-03 DIAGNOSIS — R0602 Shortness of breath: Secondary | ICD-10-CM

## 2019-01-03 DIAGNOSIS — J9811 Atelectasis: Secondary | ICD-10-CM

## 2019-01-03 NOTE — Telephone Encounter (Signed)
Please place referral for pulmonology for atelectasis.

## 2019-01-04 ENCOUNTER — Encounter: Payer: Self-pay | Admitting: Family Medicine

## 2019-01-04 NOTE — Telephone Encounter (Signed)
Please call and see how she is feeling. If same amount of abdominal pain, I would like to have her seen in the office.

## 2019-01-06 ENCOUNTER — Other Ambulatory Visit (INDEPENDENT_AMBULATORY_CARE_PROVIDER_SITE_OTHER): Payer: 59

## 2019-01-06 ENCOUNTER — Other Ambulatory Visit: Payer: Self-pay

## 2019-01-06 DIAGNOSIS — E785 Hyperlipidemia, unspecified: Secondary | ICD-10-CM | POA: Diagnosis not present

## 2019-01-06 DIAGNOSIS — R7301 Impaired fasting glucose: Secondary | ICD-10-CM | POA: Diagnosis not present

## 2019-01-06 DIAGNOSIS — R739 Hyperglycemia, unspecified: Secondary | ICD-10-CM

## 2019-01-06 DIAGNOSIS — D649 Anemia, unspecified: Secondary | ICD-10-CM

## 2019-01-06 DIAGNOSIS — R5383 Other fatigue: Secondary | ICD-10-CM | POA: Diagnosis not present

## 2019-01-06 LAB — CBC WITH DIFFERENTIAL/PLATELET
Basophils Absolute: 0 K/uL (ref 0.0–0.1)
Basophils Relative: 0.2 % (ref 0.0–3.0)
Eosinophils Absolute: 0 K/uL (ref 0.0–0.7)
Eosinophils Relative: 0 % (ref 0.0–5.0)
HCT: 36.3 % (ref 36.0–46.0)
Hemoglobin: 11.9 g/dL — ABNORMAL LOW (ref 12.0–15.0)
Lymphocytes Relative: 21.6 % (ref 12.0–46.0)
Lymphs Abs: 1.1 K/uL (ref 0.7–4.0)
MCHC: 32.8 g/dL (ref 30.0–36.0)
MCV: 87.6 fl (ref 78.0–100.0)
Monocytes Absolute: 0.4 K/uL (ref 0.1–1.0)
Monocytes Relative: 8.1 % (ref 3.0–12.0)
Neutro Abs: 3.6 K/uL (ref 1.4–7.7)
Neutrophils Relative %: 70.1 % (ref 43.0–77.0)
Platelets: 305 K/uL (ref 150.0–400.0)
RBC: 4.15 Mil/uL (ref 3.87–5.11)
RDW: 15.1 % (ref 11.5–15.5)
WBC: 5.1 K/uL (ref 4.0–10.5)

## 2019-01-06 LAB — COMPREHENSIVE METABOLIC PANEL
ALT: 16 U/L (ref 0–35)
AST: 14 U/L (ref 0–37)
Albumin: 4.1 g/dL (ref 3.5–5.2)
Alkaline Phosphatase: 60 U/L (ref 39–117)
BUN: 11 mg/dL (ref 6–23)
CO2: 26 mEq/L (ref 19–32)
Calcium: 9.4 mg/dL (ref 8.4–10.5)
Chloride: 103 mEq/L (ref 96–112)
Creatinine, Ser: 0.87 mg/dL (ref 0.40–1.20)
GFR: 68.66 mL/min (ref >60.00)
Glucose, Bld: 113 mg/dL — ABNORMAL HIGH (ref 70–99)
Potassium: 4 mEq/L (ref 3.5–5.1)
Sodium: 139 mEq/L (ref 135–145)
Total Bilirubin: 0.5 mg/dL (ref 0.2–1.2)
Total Protein: 6.9 g/dL (ref 6.0–8.3)

## 2019-01-06 LAB — LIPID PANEL
Cholesterol: 241 mg/dL — ABNORMAL HIGH (ref 0–200)
HDL: 45.6 mg/dL (ref >39.00)
LDL Cholesterol: 166 mg/dL — ABNORMAL HIGH (ref 0–99)
NonHDL: 195.32
Total CHOL/HDL Ratio: 5
Triglycerides: 147 mg/dL (ref 0.0–149.0)
VLDL: 29.4 mg/dL (ref 0.0–40.0)

## 2019-01-06 LAB — IBC + FERRITIN
Ferritin: 28.7 ng/mL (ref 10.0–291.0)
Iron: 55 ug/dL (ref 42–145)
Saturation Ratios: 13.4 % — ABNORMAL LOW (ref 20.0–50.0)
Transferrin: 294 mg/dL (ref 212.0–360.0)

## 2019-01-06 LAB — VITAMIN D 25 HYDROXY (VIT D DEFICIENCY, FRACTURES): VITD: 23.36 ng/mL — ABNORMAL LOW (ref 30.00–100.00)

## 2019-01-06 LAB — HEMOGLOBIN A1C: Hgb A1c MFr Bld: 6.1 % (ref 4.6–6.5)

## 2019-01-06 LAB — VITAMIN B12: Vitamin B-12: 227 pg/mL (ref 211–911)

## 2019-01-07 ENCOUNTER — Encounter: Payer: Self-pay | Admitting: Family Medicine

## 2019-01-07 MED ORDER — PANTOPRAZOLE SODIUM 40 MG PO TBEC: 40.0000 mg | DELAYED_RELEASE_TABLET | Freq: Two times a day (BID) | ORAL | 5 refills | Status: DC

## 2019-01-08 ENCOUNTER — Encounter: Payer: Self-pay | Admitting: Family Medicine

## 2019-01-08 ENCOUNTER — Telehealth: Payer: Self-pay | Admitting: Allergy

## 2019-01-08 ENCOUNTER — Other Ambulatory Visit: Payer: Self-pay | Admitting: Family Medicine

## 2019-01-08 DIAGNOSIS — R1011 Right upper quadrant pain: Secondary | ICD-10-CM

## 2019-01-08 NOTE — Telephone Encounter (Signed)
Xray results ordered from Dr. Maudie Mercury last week have been faxed to the requested physician's office.

## 2019-01-08 NOTE — Telephone Encounter (Signed)
I called [name] and then had doxy encounter with her.   Alyssa Clay states that her appendix area is okay, but that the right upper quadrant pain is really getting to her.  Initially she rates it as a constant 3, but it is stopping her activity and so she upscale still 5 out of 10.  This pain is consistent in the right upper quadrant just below her rib cage.  It is about fist size.  It is worse with standing, but does not go away with sitting.  Most comfortable position for her is sitting in recliner.  She feels that there is may be some puffiness in this area, but no skin changes.  I cannot appreciate any swelling on visual exam.  She has some days where her pain is better but other days where she cannot stand any light touch over this area and pain really gets to her.  She has no nausea, no vomiting, no diarrhea.  Stools are daily and easy to pass.  Pain does not change with eating.  She is eating healthy overall with typically roasted chicken green salads and fruits.  Current weight is 276.  She still running a low-grade fever 99.5-100.4.  She sometimes takes Motrin for this which helps with the fever, but helps only minimally with the abdominal pain.  She feels sweaty and chilled with these temperatures.  Her breathing has been doing okay.  She is off the oxygen at home now.  With walking yesterday her O2 sat was 89%, but this was the lowest she got.  She did not tolerate walking very long due to the right upper quadrant abdominal pain that seemed worse with activity.  She has not yet been called by pulmonology, but I see that referral is in process still.  I reviewed again her lab work with her and recent CT abdomen pelvis results.  Nothing was commented on that I feel because her current discomfort on this scan.  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  I feel at this point the best next test  is to have her see GI specialist.  Although gallbladder looks normal on CT abdomen pelvis, I wonder if does have pain from this.  She does have a visit with me next week so we can do an in person exam at that time.  I do feel it is reassuring that she has not had significant "true fevers over 100.4" I also feel that lab work is reassuring.  I have instructed her to let us know if any worsening of discomfort.  Appreciate GI and pulmonology input.

## 2019-01-08 NOTE — Telephone Encounter (Signed)
Pt called and needs to have her labs and xray sent to [place] fax number is [phone]. She said that she is in a lot of pain and was crying. She thought they had them but they dont. [phone].

## 2019-01-09 ENCOUNTER — Encounter: Payer: Self-pay | Admitting: Gastroenterology

## 2019-01-09 ENCOUNTER — Encounter: Payer: Self-pay | Admitting: Family Medicine

## 2019-01-15 ENCOUNTER — Other Ambulatory Visit: Payer: Self-pay

## 2019-01-15 ENCOUNTER — Encounter: Payer: Self-pay | Admitting: Allergy

## 2019-01-15 ENCOUNTER — Encounter: Payer: Self-pay | Admitting: Family Medicine

## 2019-01-15 ENCOUNTER — Ambulatory Visit (INDEPENDENT_AMBULATORY_CARE_PROVIDER_SITE_OTHER): Payer: 59 | Admitting: *Deleted

## 2019-01-15 ENCOUNTER — Ambulatory Visit (INDEPENDENT_AMBULATORY_CARE_PROVIDER_SITE_OTHER): Payer: 59 | Admitting: Allergy

## 2019-01-15 VITALS — BP 144/80 | HR 94 | Temp 97.6°F | Resp 20 | Ht 65.35 in | Wt 273.8 lb

## 2019-01-15 DIAGNOSIS — J4550 Severe persistent asthma, uncomplicated: Secondary | ICD-10-CM

## 2019-01-15 DIAGNOSIS — Z8709 Personal history of other diseases of the respiratory system: Secondary | ICD-10-CM | POA: Diagnosis not present

## 2019-01-15 DIAGNOSIS — Z1159 Encounter for screening for other viral diseases: Secondary | ICD-10-CM

## 2019-01-15 DIAGNOSIS — R0602 Shortness of breath: Secondary | ICD-10-CM

## 2019-01-15 DIAGNOSIS — J9811 Atelectasis: Secondary | ICD-10-CM

## 2019-01-15 MED ORDER — BENRALIZUMAB 30 MG/ML ~~LOC~~ SOSY
30.0000 mg | PREFILLED_SYRINGE | Freq: Once | SUBCUTANEOUS | Status: AC
  Administered 2019-01-15: 30 mg via SUBCUTANEOUS

## 2019-01-15 NOTE — Progress Notes (Signed)
Follow Up Note  RE: Alyssa Clay MRN: [id number] DOB: Jul 07, 1965 Date of Office Visit: 01/15/2019  Referring provider: Caren Macadam, MD Primary care provider: Caren Macadam, MD  Chief Complaint: Shortness of Breath and Asthma  History of Present Illness: I had the pleasure of seeing Alyssa Clay for a follow up visit at the [facility] on 01/16/2019. She is a 53 y.o. female, who is being followed for SOB, asthma, rhinitis, GERD. Today she is here for regular follow up visit. Her previous allergy office visit was on 01/01/2019 with Dr. Maudie Mercury.   Shortness of breath Has issues with DOE and oxygenation goes down to 80% after ambulation sometimes when she is not wearing her oxygen.  Has oxygen at home and uses 2L overnight. Tried to wean down to 1L but didn't work out.   No pulmonology appointment was scheduled yet. Referral has been placed last month.  Asthma: Currently on Fasenra injections every 8 weeks which she thinks is helping her.  Currently using Symbicort 160 2 puffs twice a day.  Only had to use albuterol twice.  Patient had frequent bronchitis since a child every year.  No prior immunodeficiency work up.   Chronic rhinitis Only using nasal sprays as needed.   Gastroesophageal reflux disease Controlled with Protonix.   Assessment and Plan: Alyssa Clay is a 53 y.o. female with: Severe persistent asthma without complication Past history - Spiriva caused coughing.  Interim history - Asthma is doing better.   Today's spirometry showed severe restriction.   Daily controller medication(s):continue Symbicort 160 2 puffs twice a day with spacer and rinse mouth afterwards. ? Continue fasenra injections. ? Start asmanex 200 1 puff twice a day with spacer and rinse mouth afterwards for 1 month and see if it helps with your breathing.   Prior to physical activity:May use albuterol rescue inhaler 2 puffs 5 to 15 minutes prior to strenuous  physical activities.  Rescue medications:May use albuterol rescue inhaler 2 puffs or nebulizer every 4 to 6 hours as needed for shortness of breath, chest tightness, coughing, and wheezing. Monitor frequency of use.   During upper respiratory infections/asthma flares: Start asmanex 200 1 puff twice a day with spacer and rinse mouth afterwards for 1 week.  Follow up with pulmonology when you get appointment.  Check for alpha-1 antitrypsin deficiency.  Atelectasis 9/9/202 CT chest showed: 1. Prominent elevation of the right hemidiaphragm with associated right lung base atelectasis including complete right middle lobe atelectasis. Right hemidiaphragm paralysis not excluded.  The atelectasis is most likely contributing to her hypoxemia and shortness of breath.  Monitor symptoms and pulse ox.  Take oxygen with you especially when going out of the home.   Start doing flutter valve 4 times a day. Instructions given on how to perform.  Referral to pulmonology was placed at last OV and by PCP.   History of frequent upper respiratory infection History of frequent bronchitis in her lifetime.  Will obtain basic immune evaluation bloodwork.  Keep track of infections.   Shortness of breath CT chest did not show PE.   See plan as above for asthma and atelectasis.   Return in about 4 weeks (around 02/12/2019).   Lab Orders     IgG, IgA, IgM     IgG 1, 2, 3, and 4     Strep pneumoniae 23 Serotypes IgG     Diphtheria / Tetanus Antibody Panel     VITAMIN D 25 Hydroxy (Vit-D  Deficiency, Fractures)     Alpha-1-antitrypsin     SARS-CoV-2 Antibodies  Diagnostics: Spirometry:  Tracings reviewed. Her effort: Good reproducible efforts. FVC: 1.83L FEV1: 1.42L, 43% predicted FEV1/FVC ratio: 78% Interpretation: severe restrictions.  Please see scanned spirometry results for details.  Medication List:  Current Outpatient Medications  Medication Sig Dispense Refill  . albuterol  (PROVENTIL HFA;VENTOLIN HFA) 108 (90 Base) MCG/ACT inhaler Inhale 2 puffs into the lungs every 6 (six) hours as needed for wheezing or shortness of breath. 1 Inhaler 1  . albuterol (PROVENTIL) (2.5 MG/3ML) 0.083% nebulizer solution Take 2.5 mg by nebulization every 6 (six) hours as needed for wheezing or shortness of breath.    . budesonide-formoterol (SYMBICORT) 160-4.5 MCG/ACT inhaler Inhale 2 puffs into the lungs 2 (two) times daily. 1 Inhaler 5  . calcium carbonate (OSCAL) 1500 (600 Ca) MG TABS tablet Take 600 mg of elemental calcium by mouth 2 (two) times daily with a meal.    . cyclobenzaprine (FLEXERIL) 10 MG tablet Take 10 mg by mouth 3 (three) times daily as needed (migraines).    . DULoxetine (CYMBALTA) 60 MG capsule TAKE 1 CAPSULE BY MOUTH EVERY DAY (Patient taking differently: Take 60 mg by mouth daily. ) 30 capsule 5  . Erenumab-aooe (AIMOVIG) 140 MG/ML SOAJ Inject into the skin.    Marland Kitchen FASENRA 30 MG/ML SOSY SECOND SHIP: INJECT ONE SYRINGE UNDER THE SKIN AT WEEK 4 AND 8, THEN EVERY 8 WEEKS THEREAFTER. (Patient taking differently: Inject 30 mg into the skin See admin instructions. Every 8 weeks) 1 Syringe 8  . fluticasone (FLONASE) 50 MCG/ACT nasal spray Place 1 spray into both nostrils 2 (two) times daily. 16 g 5  . gabapentin (NEURONTIN) 300 MG capsule Take 300-900 mg by mouth See admin instructions. Take 1 tablet every AM, three tablets at bedtime    . meloxicam (MOBIC) 7.5 MG tablet Take 1 tablet (7.5 mg total) by mouth daily. 30 tablet 0  . methocarbamol (ROBAXIN) 500 MG tablet Take 1 tablet (500 mg total) by mouth 2 (two) times daily as needed for muscle spasms. 30 tablet 0  . Multiple Vitamin (MULTIVITAMIN WITH MINERALS) TABS tablet Take 1 tablet by mouth daily.    . pantoprazole (PROTONIX) 40 MG tablet Take 1 tablet (40 mg total) by mouth 2 (two) times daily before a meal. 60 tablet 5  . rizatriptan (MAXALT) 10 MG tablet Take 10 mg by mouth as needed for migraine. May repeat in 2  hours if needed    . traMADol (ULTRAM) 50 MG tablet Take 50 mg by mouth every 6 (six) hours as needed.    . traZODone (DESYREL) 100 MG tablet Take 1-2 tablets (100-200 mg total) by mouth at bedtime as needed for sleep. 180 tablet 1  . valACYclovir (VALTREX) 1000 MG tablet Take 1 tablet (1,000 mg total) by mouth 2 (two) times daily. (Patient taking differently: Take 1,000 mg by mouth 2 (two) times daily as needed (outbreak). ) 18 tablet 5  . losartan (COZAAR) 50 MG tablet TAKE 1 TABLET BY MOUTH EVERY DAY 30 tablet 2  . Mometasone Furoate (ASMANEX HFA) 200 MCG/ACT AERO Inhale 1 puff into the lungs 2 (two) times daily. Use with a spacer and rinse mouth out afterwards, use for 1 mouth to see how your breathing is. 13 g 1  . montelukast (SINGULAIR) 10 MG tablet Take 1 tablet (10 mg total) by mouth daily. 30 tablet 5  . polyethylene glycol (MIRALAX / GLYCOLAX) 17 g packet Take 17  g by mouth daily. (Patient not taking: Reported on 01/15/2019) 14 each 0   No current facility-administered medications for this visit.    Allergies: No Known Allergies I reviewed her past medical history, social history, family history, and environmental history and no significant changes have been reported from previous visit on 01/01/2019.  Review of Systems  Constitutional: Positive for fever. Negative for appetite change, chills and unexpected weight change.  HENT: Negative for congestion, postnasal drip and rhinorrhea.   Eyes: Negative for itching.  Respiratory: Positive for cough and shortness of breath. Negative for chest tightness and wheezing.   Gastrointestinal: Positive for abdominal pain.  Skin: Negative for rash.  Allergic/Immunologic: Negative for environmental allergies.  Neurological: Positive for headaches.   Objective: BP (!) 144/80 (BP Location: Left Arm, Patient Position: Sitting, Cuff Size: Large)   Pulse 94   Temp 97.6 F (36.4 C) (Temporal)   Resp 20   Ht 5' 5.35" (1.66 m)   Wt 273 lb 12.8 oz  (124.2 kg)   SpO2 94%   BMI 45.07 kg/m  Body mass index is 45.07 kg/m. Physical Exam  Constitutional: She is oriented to person, place, and time. She appears well-developed and well-nourished.  HENT:  Head: Normocephalic and atraumatic.  Right Ear: External ear normal.  Left Ear: External ear normal.  Nose: Nose normal.  Mouth/Throat: Oropharynx is clear and moist.  Eyes: Conjunctivae and EOM are normal.  Neck: Neck supple.  Cardiovascular: Normal rate, regular rhythm and normal heart sounds. Exam reveals no gallop and no friction rub.  No murmur heard. Pulmonary/Chest: Effort normal and breath sounds normal. She has no wheezes. She has no rales.  Neurological: She is alert and oriented to person, place, and time.  Skin: Skin is warm. No rash noted.  Psychiatric: She has a normal mood and affect. Her behavior is normal.  Nursing note and vitals reviewed.  Previous notes and tests were reviewed. The plan was reviewed with the patient/family, and all questions/concerned were addressed.  It was my pleasure to see Alyssa Clay today and participate in her care. Please feel free to contact me with any questions or concerns.  Sincerely,  Rexene Alberts, DO Allergy & Immunology  [facility] office: [phone] [facility] office: [place] office: [phone]

## 2019-01-15 NOTE — Patient Instructions (Addendum)
Get bloodwork and will let you know of the results. Usually takes 2-3 weeks.   Shortness of breath Monitor symptoms and pulse ox. Take oxygen with you especially when going out of the home.  Start doing flutter valve 4 times a day.  https://[email or web address]/acapella-flutter-valve/ How to Use the Flutter Valve? For someone using a flutter valve for the first time, make sure the frequency adjustment dial is set counterclockwise to the lowest frequency-resistance setting. Now, sit in a chair by keeping your back straight. Keep your elbows resting comfortably on the table. Now, tilt your head slightly upward.  This will help to keep the upper airway open wide. As a result, the air you exhale will be able to flow out smoothly.   Take a breath that is deeper than a normal inhalation. Make sure that your lips make a tight seal around the mouthpiece, then blow into the device with a forceful exhalation.  Typically, you should blow out about twice as fast as normal, but not as hard as you can.  Repeat this process for approximately 10 breaths.  After the final attempt, be sure to cough, in order to remove the secretions from the airways. You can even perform 2 to 3 "huff" coughs in order to increase secretion removal as needed.   Asthma  Daily controller medication(s):continue Symbicort 160 2 puffs twice a day with spacer and rinse mouth afterwards. ? Continue fasenra injections. ? Start asmanex 200 1 puff twice a day with spacer and rinse mouth afterwards for 1 month and see if it helps with your breathing.   Prior to physical activity:May use albuterol rescue inhaler 2 puffs 5 to 15 minutes prior to strenuous physical activities.  Rescue medications:May use albuterol rescue inhaler 2 puffs or nebulizer every 4 to 6 hours as needed for shortness of breath, chest tightness, coughing, and wheezing. Monitor frequency of use.   During upper respiratory infections/asthma flares:  Start asmanex 200 1 puff twice a day with spacer and rinse mouth afterwards for 1 week. Asthma control goals:  Full participation in all desired activities (may need albuterol before activity) Albuterol use two times or less a week on average (not counting use with activity) Cough interfering with sleep two times or less a month Oral steroids no more than once a year No hospitalizations  Keep track of infections/bronchitis.  Follow up with pulmonology when you get appointment.  Chronic rhinitis  May use Flonase + Astelin 1 spray twice a day as needed.  Nasal saline spray (i.e., Simply Saline) or nasal saline lavage (i.e., NeilMed) is recommended as needed and prior to medicated nasal sprays.  Gastroesophageal reflux disease Controlled with Protonix.   Continue pantoprazole (Protonix) as prescribed.  Continue reflux lifestyle modifications and diet.   Follow up in 4 weeks

## 2019-01-16 ENCOUNTER — Other Ambulatory Visit: Payer: Self-pay | Admitting: Allergy

## 2019-01-16 ENCOUNTER — Encounter: Payer: Self-pay | Admitting: Allergy

## 2019-01-16 ENCOUNTER — Other Ambulatory Visit: Payer: Self-pay

## 2019-01-16 ENCOUNTER — Other Ambulatory Visit: Payer: Self-pay | Admitting: Family Medicine

## 2019-01-16 DIAGNOSIS — Z1159 Encounter for screening for other viral diseases: Secondary | ICD-10-CM | POA: Insufficient documentation

## 2019-01-16 DIAGNOSIS — Z8709 Personal history of other diseases of the respiratory system: Secondary | ICD-10-CM | POA: Insufficient documentation

## 2019-01-16 DIAGNOSIS — J9811 Atelectasis: Secondary | ICD-10-CM | POA: Insufficient documentation

## 2019-01-16 DIAGNOSIS — J4550 Severe persistent asthma, uncomplicated: Secondary | ICD-10-CM | POA: Insufficient documentation

## 2019-01-16 LAB — SARS-COV-2 ANTIBODIES: SARS-CoV-2 Antibodies: NEGATIVE

## 2019-01-16 MED ORDER — MONTELUKAST SODIUM 10 MG PO TABS: 10.0000 mg | ORAL_TABLET | Freq: Every day | ORAL | 5 refills | Status: DC

## 2019-01-16 MED ORDER — ASMANEX HFA 200 MCG/ACT IN AERO: 1.0000 | INHALATION_SPRAY | Freq: Two times a day (BID) | RESPIRATORY_TRACT | 1 refills | Status: DC

## 2019-01-16 NOTE — Assessment & Plan Note (Addendum)
Past history - Spiriva caused coughing.  Interim history - Asthma is doing better.   Today's spirometry showed severe restriction.   Daily controller medication(s):continue Symbicort 160 2 puffs twice a day with spacer and rinse mouth afterwards. ? Continue fasenra injections. ? Start asmanex 200 1 puff twice a day with spacer and rinse mouth afterwards for 1 month and see if it helps with your breathing.   Prior to physical activity:May use albuterol rescue inhaler 2 puffs 5 to 15 minutes prior to strenuous physical activities.  Rescue medications:May use albuterol rescue inhaler 2 puffs or nebulizer every 4 to 6 hours as needed for shortness of breath, chest tightness, coughing, and wheezing. Monitor frequency of use.   During upper respiratory infections/asthma flares: Start asmanex 200 1 puff twice a day with spacer and rinse mouth afterwards for 1 week.  Follow up with pulmonology when you get appointment.  Check for alpha-1 antitrypsin deficiency.

## 2019-01-16 NOTE — Assessment & Plan Note (Signed)
9/9/202 CT chest showed: 1. Prominent elevation of the right hemidiaphragm with associated right lung base atelectasis including complete right middle lobe atelectasis. Right hemidiaphragm paralysis not excluded.  The atelectasis is most likely contributing to her hypoxemia and shortness of breath.  Monitor symptoms and pulse ox.  Take oxygen with you especially when going out of the home.   Start doing flutter valve 4 times a day. Instructions given on how to perform.  Referral to pulmonology was placed at last OV and by PCP.

## 2019-01-16 NOTE — Telephone Encounter (Signed)
Patient called to get a sample or a prescription sent in for asmanex.  [facility]

## 2019-01-16 NOTE — Telephone Encounter (Signed)
Sent in script for Asmanex

## 2019-01-16 NOTE — Assessment & Plan Note (Signed)
History of frequent bronchitis in her lifetime.  Will obtain basic immune evaluation bloodwork.  Keep track of infections.

## 2019-01-16 NOTE — Assessment & Plan Note (Addendum)
CT chest did not show PE.   See plan as above for asthma and atelectasis.

## 2019-01-17 ENCOUNTER — Ambulatory Visit: Payer: Self-pay

## 2019-01-17 ENCOUNTER — Telehealth: Payer: Self-pay | Admitting: *Deleted

## 2019-01-17 NOTE — Telephone Encounter (Signed)
Called and left a message for patient to call office in regards to this matter. 

## 2019-01-17 NOTE — Telephone Encounter (Signed)
Patient called and was given a recommended lung device, Vibratory PEP device with mouth piece DH Green, by Dr. Maudie Mercury and cannot find it on [place]. Patient would like to know if there is another choice. Has seen different ones like a breather, and wants to know If there is another one she can use   Patient would like a breast reduction and is asking for a doctor to call [facility] [phone]), to explain why this would benefit the patient and help fill out the paperwork. Patient states this would help her breathe better and live longer. Patient would like a return call from a nurse.

## 2019-01-17 NOTE — Telephone Encounter (Signed)
Please advise 

## 2019-01-17 NOTE — Telephone Encounter (Signed)
Please call patient.  [email or web address]  That's a website. She can google acapella flutter valve and there are couple of online companies that sell them.  Regarding the breast reduction. I do not feel comfortable recommending this surgery given the fact that her respiratory status is not back to baseline. It will not help with her hypoxemic episodes. She should wait to get any type of elective procedures done until her lungs are back to her baseline. I would also feel more comfortable if she has an evaluation by pulmonology as well before undergoing any elective surgeries such as this.

## 2019-01-20 ENCOUNTER — Ambulatory Visit (INDEPENDENT_AMBULATORY_CARE_PROVIDER_SITE_OTHER): Payer: 59 | Admitting: Family Medicine

## 2019-01-20 ENCOUNTER — Other Ambulatory Visit: Payer: Self-pay

## 2019-01-20 ENCOUNTER — Encounter: Payer: Self-pay | Admitting: Family Medicine

## 2019-01-20 VITALS — BP 102/72 | HR 100 | Temp 98.0°F | Ht 65.5 in | Wt 272.5 lb

## 2019-01-20 DIAGNOSIS — Z23 Encounter for immunization: Secondary | ICD-10-CM

## 2019-01-20 DIAGNOSIS — Z9981 Dependence on supplemental oxygen: Secondary | ICD-10-CM

## 2019-01-20 DIAGNOSIS — R1011 Right upper quadrant pain: Secondary | ICD-10-CM | POA: Diagnosis not present

## 2019-01-20 DIAGNOSIS — J986 Disorders of diaphragm: Secondary | ICD-10-CM

## 2019-01-20 DIAGNOSIS — J4540 Moderate persistent asthma, uncomplicated: Secondary | ICD-10-CM

## 2019-01-20 NOTE — Progress Notes (Signed)
Alyssa Clay DOB: 03-02-1966 Encounter date: 01/20/2019  This is a 53 y.o. female who presents with Chief Complaint  Patient presents with  . Follow-up    History of present illness: Hasn't heard from pulmonology yet. Number was given to them today.   Back on oxygen because asthma doc wasn't happy with her O2 sats off oxygen when she was at their office. Sitting without O2 was 94-95. With O2 she is 95-96 at 1 Liter. When she was on elyptical yesterday and used 4L and stayed at 98% with exercise. She went for 4.5 minutes on this. Felt more in knees than anywhere else. Pulse was 133-141 while doing this.   Walking without oxygen (did this 2 weeks ago); was very humid day; levels dropped to 80. Seems to do worse with humid, muggy weather. Has also felt light headed with walking inside with windows open/also muggier day; felt bad when coming back in house.   Walking with oxygen 92-94. Just walking in house and not outside now.   Has been dieting and doing very well. Was 314lb when discharged from hospital. Today was 273.   Would like to have breast reduction. [facility] will cover this; told her there was number to call for medical criteria. Has always had larger breast size. They are biggest now. She is cup size 44H currently. Hasn't lost significant breast size with weight loss in past. Neck strain, shoulder strain, migraines, breathing more difficult. Gets sores from bra straps digging in. Gets sores under breasts, yeast dermatitis; treats frequently with medication.   Pain up under right breast still has numb, tender feeling but is doing better.   No Known Allergies Current Meds  Medication Sig  . albuterol (PROVENTIL HFA;VENTOLIN HFA) 108 (90 Base) MCG/ACT inhaler Inhale 2 puffs into the lungs every 6 (six) hours as needed for wheezing or shortness of breath.  Marland Kitchen albuterol (PROVENTIL) (2.5 MG/3ML) 0.083% nebulizer solution Take 2.5 mg by nebulization every 6 (six) hours as needed  for wheezing or shortness of breath.  . budesonide-formoterol (SYMBICORT) 160-4.5 MCG/ACT inhaler Inhale 2 puffs into the lungs 2 (two) times daily.  . calcium carbonate (OSCAL) 1500 (600 Ca) MG TABS tablet Take 600 mg of elemental calcium by mouth 2 (two) times daily with a meal.  . cyclobenzaprine (FLEXERIL) 10 MG tablet Take 10 mg by mouth 3 (three) times daily as needed (migraines).  . DULoxetine (CYMBALTA) 60 MG capsule TAKE 1 CAPSULE BY MOUTH EVERY DAY (Patient taking differently: Take 60 mg by mouth daily. )  . Erenumab-aooe (AIMOVIG) 140 MG/ML SOAJ Inject into the skin.  Marland Kitchen FASENRA 30 MG/ML SOSY SECOND SHIP: INJECT ONE SYRINGE UNDER THE SKIN AT WEEK 4 AND 8, THEN EVERY 8 WEEKS THEREAFTER. (Patient taking differently: Inject 30 mg into the skin See admin instructions. Every 8 weeks)  . fluticasone (FLONASE) 50 MCG/ACT nasal spray Place 1 spray into both nostrils 2 (two) times daily.  Marland Kitchen gabapentin (NEURONTIN) 300 MG capsule Take 300-900 mg by mouth See admin instructions. Take 1 tablet every AM, three tablets at bedtime  . levonorgestrel (MIRENA) 20 MCG/24HR IUD 1 each by Intrauterine route once.  Marland Kitchen losartan (COZAAR) 50 MG tablet TAKE 1 TABLET BY MOUTH EVERY DAY  . meloxicam (MOBIC) 7.5 MG tablet Take 1 tablet (7.5 mg total) by mouth daily. (Patient taking differently: Take 7.5 mg by mouth as needed. )  . methocarbamol (ROBAXIN) 500 MG tablet Take 1 tablet (500 mg total) by mouth 2 (two) times daily as  needed for muscle spasms.  . Mometasone Furoate (ASMANEX HFA) 200 MCG/ACT AERO Inhale 1 puff into the lungs 2 (two) times daily. Use with a spacer and rinse mouth out afterwards, use for 1 mouth to see how your breathing is.  . montelukast (SINGULAIR) 10 MG tablet Take 1 tablet (10 mg total) by mouth daily.  . Multiple Vitamin (MULTIVITAMIN WITH MINERALS) TABS tablet Take 1 tablet by mouth daily.  . pantoprazole (PROTONIX) 40 MG tablet Take 1 tablet (40 mg total) by mouth 2 (two) times daily  before a meal.  . rizatriptan (MAXALT) 10 MG tablet Take 10 mg by mouth as needed for migraine. May repeat in 2 hours if needed  . traMADol (ULTRAM) 50 MG tablet Take 50 mg by mouth every 6 (six) hours as needed.  . traZODone (DESYREL) 100 MG tablet Take 1-2 tablets (100-200 mg total) by mouth at bedtime as needed for sleep.  . valACYclovir (VALTREX) 1000 MG tablet Take 1 tablet (1,000 mg total) by mouth 2 (two) times daily. (Patient taking differently: Take 1,000 mg by mouth 2 (two) times daily as needed (outbreak). )  . [DISCONTINUED] polyethylene glycol (MIRALAX / GLYCOLAX) 17 g packet Take 17 g by mouth daily.    Review of Systems  Constitutional: Negative for chills, fatigue and fever (night time fevers have stopped).  Respiratory: Negative for cough, chest tightness, shortness of breath (not really having issues with SOB, but see hpi) and wheezing.   Cardiovascular: Negative for chest pain, palpitations and leg swelling.  Gastrointestinal: Positive for abdominal pain (improvement in RUQ abd pain). Negative for blood in stool, constipation, diarrhea, nausea and vomiting.  Psychiatric/Behavioral: Negative for sleep disturbance. The patient is not nervous/anxious.     Objective:  BP 102/72 (BP Location: Right Arm, Patient Position: Sitting, Cuff Size: Large)   Pulse 100   Temp 98 F (36.7 C) (Temporal)   Ht 5' 5.5" (1.664 m)   Wt 272 lb 8 oz (123.6 kg)   SpO2 98%   BMI 44.66 kg/m   Weight: 272 lb 8 oz (123.6 kg)   BP Readings from Last 3 Encounters:  01/20/19 102/72  01/15/19 (!) 144/80  01/01/19 130/84   Wt Readings from Last 3 Encounters:  01/20/19 272 lb 8 oz (123.6 kg)  01/15/19 273 lb 12.8 oz (124.2 kg)  12/18/18 294 lb (133.4 kg)    Physical Exam Constitutional:      General: She is not in acute distress.    Appearance: She is well-developed.  Cardiovascular:     Rate and Rhythm: Normal rate and regular rhythm.     Heart sounds: Normal heart sounds. No murmur. No  friction rub.  Pulmonary:     Effort: Pulmonary effort is normal. No respiratory distress.     Breath sounds: Normal breath sounds. No wheezing or rales.  Abdominal:     General: Abdomen is protuberant. Bowel sounds are normal.     Palpations: Abdomen is soft.     Tenderness: There is abdominal tenderness in the right upper quadrant and epigastric area.     Comments: There is some right lower rib cage tenderness, epigastric tenderness, and right upper quadrant tenderness.  There is no guarding or rebound tenderness.  She states pain has improved in this area.  Musculoskeletal:     Right lower leg: No edema.     Left lower leg: No edema.  Neurological:     Mental Status: She is alert and oriented to person, place, and time.  Psychiatric:        Behavior: Behavior normal.     Assessment/Plan 1. Need for immunization against influenza - Flu Vaccine QUAD 6+ mos PF IM (Fluarix Quad PF)  2. RUQ abdominal pain This is improving, but she does still have some tenderness in this area.  Of note, gallbladder did appear normal on recent CT.  Pain is not worse surrounding eating, so I am not certain that there is any issue with the gallbladder.  There is some slight rib tenderness in this area, wonder if she had inflammation related to slight twisting of the rib.  As long as she continues to have improvement with discomfort, I do not feel that further evaluation is needed.  If any worsening, would consider further evaluation.  She does have an appointment with GI coming up.  3. O2 dependent Breathing feels comfortable, but she is still getting hypoxic with activity.  Referral has been placed for pulmonary, but she has not heard back yet.  I gave her their number today to touch base with them to see about scheduling follow-up appointment.  4. Moderate persistent asthma, unspecified whether complicated Asthma has been well controlled in general.  Postsurgical atelectasis, diaphragm dysfunction on the  right have contributed to current hypoxia with activity.  Pulmonology has been consulted for further input.  Breathing has improved since she has left the hospital.  No cough.  She feels comfortable with breathing.  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  See above.    Return in about 3 months (around 04/21/2019) for Chronic condition visit. I have asked her to send me message to order bloodwork prior to that visit.     Micheline Rough, MD

## 2019-01-20 NOTE — Patient Instructions (Signed)
[facility]: [phone]. 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

## 2019-01-21 LAB — STREP PNEUMONIAE 23 SEROTYPES IGG
Pneumo Ab Type 1*: 1.5 ug/mL (ref >1.3)
Pneumo Ab Type 12 (12F)*: 0.5 ug/mL — ABNORMAL LOW (ref >1.3)
Pneumo Ab Type 14*: >29.1 ug/mL (ref >1.3)
Pneumo Ab Type 17 (17F)*: 0.9 ug/mL — ABNORMAL LOW (ref >1.3)
Pneumo Ab Type 19 (19F)*: 6.3 ug/mL (ref >1.3)
Pneumo Ab Type 2*: 2.8 ug/mL (ref >1.3)
Pneumo Ab Type 20*: 8.3 ug/mL (ref >1.3)
Pneumo Ab Type 22 (22F)*: 0.7 ug/mL — ABNORMAL LOW (ref >1.3)
Pneumo Ab Type 23 (23F)*: 2.2 ug/mL (ref >1.3)
Pneumo Ab Type 26 (6B)*: 16.8 ug/mL (ref >1.3)
Pneumo Ab Type 3*: 5.8 ug/mL (ref >1.3)
Pneumo Ab Type 34 (10A)*: 27.8 ug/mL (ref >1.3)
Pneumo Ab Type 4*: 1.7 ug/mL (ref >1.3)
Pneumo Ab Type 43 (11A)*: 0.8 ug/mL — ABNORMAL LOW (ref >1.3)
Pneumo Ab Type 5*: 16.5 ug/mL (ref >1.3)
Pneumo Ab Type 51 (7F)*: 6.2 ug/mL (ref >1.3)
Pneumo Ab Type 54 (15B)*: 1.1 ug/mL — ABNORMAL LOW (ref >1.3)
Pneumo Ab Type 56 (18C)*: 2.9 ug/mL (ref >1.3)
Pneumo Ab Type 57 (19A)*: 11.8 ug/mL (ref >1.3)
Pneumo Ab Type 68 (9V)*: 1.3 ug/mL — ABNORMAL LOW (ref >1.3)
Pneumo Ab Type 70 (33F)*: 1 ug/mL — ABNORMAL LOW (ref >1.3)
Pneumo Ab Type 8*: 2.2 ug/mL (ref >1.3)
Pneumo Ab Type 9 (9N)*: 2.6 ug/mL (ref >1.3)

## 2019-01-21 LAB — IGG 1, 2, 3, AND 4
IgG (Immunoglobin G), Serum: 1170 mg/dL (ref 586–1602)
IgG, Subclass 1: 607 mg/dL (ref 248–810)
IgG, Subclass 2: 374 mg/dL (ref 130–555)
IgG, Subclass 3: 44 mg/dL (ref 15–102)
IgG, Subclass 4: 48 mg/dL (ref 2–96)

## 2019-01-21 LAB — IGG, IGA, IGM
IgA/Immunoglobulin A, Serum: 183 mg/dL (ref 87–352)
IgM (Immunoglobulin M), Srm: 153 mg/dL (ref 26–217)

## 2019-01-21 LAB — DIPHTHERIA / TETANUS ANTIBODY PANEL
Diphtheria Ab: 1.82 IU/mL (ref <0.10)
Tetanus Ab, IgG: 3.84 IU/mL (ref <0.10)

## 2019-01-21 LAB — ALPHA-1-ANTITRYPSIN: A-1 Antitrypsin: 158 mg/dL (ref 101–187)

## 2019-01-21 LAB — VITAMIN D 25 HYDROXY (VIT D DEFICIENCY, FRACTURES): Vit D, 25-Hydroxy: 23.6 ng/mL — ABNORMAL LOW (ref 30.0–100.0)

## 2019-01-21 NOTE — Telephone Encounter (Signed)
Called patient and informed her of Dr. Julianne Rice recommendation. She is wondering if she needed to get the low flow or high flow valve. She was also wondering about her lab results. It looks like they have come back. Please advise.

## 2019-01-22 ENCOUNTER — Ambulatory Visit (INDEPENDENT_AMBULATORY_CARE_PROVIDER_SITE_OTHER): Payer: 59 | Admitting: Pulmonary Disease

## 2019-01-22 ENCOUNTER — Other Ambulatory Visit: Payer: Self-pay

## 2019-01-22 ENCOUNTER — Encounter: Payer: Self-pay | Admitting: Pulmonary Disease

## 2019-01-22 ENCOUNTER — Other Ambulatory Visit: Payer: Self-pay | Admitting: Family Medicine

## 2019-01-22 VITALS — BP 120/76 | HR 90 | Temp 97.4°F | Ht 65.5 in | Wt 273.8 lb

## 2019-01-22 DIAGNOSIS — J4540 Moderate persistent asthma, uncomplicated: Secondary | ICD-10-CM

## 2019-01-22 DIAGNOSIS — R0902 Hypoxemia: Secondary | ICD-10-CM

## 2019-01-22 DIAGNOSIS — N62 Hypertrophy of breast: Secondary | ICD-10-CM

## 2019-01-22 MED ORDER — FLUTTER DEVI: 0 refills | Status: AC

## 2019-01-22 NOTE — Progress Notes (Signed)
Alyssa Clay    IN:[id number]    1965/08/11  Primary Care Physician:Koberlein, Steele Berg, MD  Referring Physician: Caren Macadam, MD [place],  [place] [address]  Chief complaint: Consult for hypoxia  HPI: 53 year old with severe persistent asthma, right hemidiaphragm paralysis, depression, hypertension, migraine Hospitalized in August 2020 due to appendicitis with perforation status post exploratory laparotomy.  Postop course complicated by AKI, fluid overload, hypoxia, respiratory failure.  She was diuresed and discharged on supplemental oxygen.  She continues on 1 L oxygen which increases to 4 L while on the elliptical.  Also uses 1 L oxygen at night.  She had been evaluated for sleep apnea in 2005 and was told she had a negative study  She has history of severe persistent asthma and follows with allergy, immunology.  Started on [place] in June 2020. History notable for right hemidiaphragm paralysis.  This was diagnosed in 2005 but probably had been present for many years before.  Denies any surgeries or trauma to the chest or neck.  Also has recurrent bronchitis.  No history of premature birth or low birthweight.  Pets: Has a dog.  No cats, birds, farm animals Occupation: Currently disabled.  Used to work as a Airline pilot Exposures: No known exposures.  No mold, hot tub, Jacuzzi Smoking history: Never smoker Travel history: Originally from [name].  Previously lived in [place].  Moved to [place] in 2019 Relevant family history: Has family history of asthma.  No other significant family history of lung disease.  Outpatient Encounter Medications as of 01/22/2019  Medication Sig  . albuterol (PROVENTIL HFA;VENTOLIN HFA) 108 (90 Base) MCG/ACT inhaler Inhale 2 puffs into the lungs every 6 (six) hours as needed for wheezing or shortness of breath.  Marland Kitchen albuterol (PROVENTIL) (2.5 MG/3ML) 0.083% nebulizer solution Take 2.5  mg by nebulization every 6 (six) hours as needed for wheezing or shortness of breath.  . budesonide-formoterol (SYMBICORT) 160-4.5 MCG/ACT inhaler Inhale 2 puffs into the lungs 2 (two) times daily.  . calcium carbonate (OSCAL) 1500 (600 Ca) MG TABS tablet Take 600 mg of elemental calcium by mouth 2 (two) times daily with a meal.  . cyclobenzaprine (FLEXERIL) 10 MG tablet Take 10 mg by mouth 3 (three) times daily as needed (migraines).  . DULoxetine (CYMBALTA) 60 MG capsule TAKE 1 CAPSULE BY MOUTH EVERY DAY (Patient taking differently: Take 60 mg by mouth daily. )  . Erenumab-aooe (AIMOVIG) 140 MG/ML SOAJ Inject into the skin.  Marland Kitchen FASENRA 30 MG/ML SOSY SECOND SHIP: INJECT ONE SYRINGE UNDER THE SKIN AT WEEK 4 AND 8, THEN EVERY 8 WEEKS THEREAFTER. (Patient taking differently: Inject 30 mg into the skin See admin instructions. Every 8 weeks)  . fluticasone (FLONASE) 50 MCG/ACT nasal spray Place 1 spray into both nostrils 2 (two) times daily.  Marland Kitchen gabapentin (NEURONTIN) 300 MG capsule Take 300-900 mg by mouth See admin instructions. Take 1 tablet every AM, three tablets at bedtime  . levonorgestrel (MIRENA) 20 MCG/24HR IUD 1 each by Intrauterine route once.  Marland Kitchen losartan (COZAAR) 50 MG tablet TAKE 1 TABLET BY MOUTH EVERY DAY  . meloxicam (MOBIC) 7.5 MG tablet Take 1 tablet (7.5 mg total) by mouth daily. (Patient taking differently: Take 7.5 mg by mouth as needed. )  . methocarbamol (ROBAXIN) 500 MG tablet Take 1 tablet (500 mg total) by mouth 2 (two) times daily as needed for muscle spasms.  . montelukast (SINGULAIR) 10  MG tablet Take 1 tablet (10 mg total) by mouth daily.  . Multiple Vitamin (MULTIVITAMIN WITH MINERALS) TABS tablet Take 1 tablet by mouth daily.  . pantoprazole (PROTONIX) 40 MG tablet Take 1 tablet (40 mg total) by mouth 2 (two) times daily before a meal.  . rizatriptan (MAXALT) 10 MG tablet Take 10 mg by mouth as needed for migraine. May repeat in 2 hours if needed  . traMADol (ULTRAM) 50 MG  tablet Take 50 mg by mouth every 6 (six) hours as needed.  . traZODone (DESYREL) 100 MG tablet Take 1-2 tablets (100-200 mg total) by mouth at bedtime as needed for sleep.  . valACYclovir (VALTREX) 1000 MG tablet Take 1 tablet (1,000 mg total) by mouth 2 (two) times daily. (Patient taking differently: Take 1,000 mg by mouth 2 (two) times daily as needed (outbreak). )  . [DISCONTINUED] Mometasone Furoate (ASMANEX HFA) 200 MCG/ACT AERO Inhale 1 puff into the lungs 2 (two) times daily. Use with a spacer and rinse mouth out afterwards, use for 1 mouth to see how your breathing is.   No facility-administered encounter medications on file as of 01/22/2019.     Allergies as of 01/22/2019  . (No Known Allergies)    Past Medical History:  Diagnosis Date  . Asthma   . Depression   . Hypertension   . Migraine     Past Surgical History:  Procedure Laterality Date  . CESAREAN SECTION  1994  . DILATION AND CURETTAGE OF UTERUS     multiple due to miscarriages  . KNEE ARTHROSCOPY Left 2013  . LAPAROSCOPIC APPENDECTOMY N/A 12/08/2018   Procedure: APPENDECTOMY LAPAROSCOPIC;  Surgeon: Donnie Mesa, MD;  Location: [facility];  Service: General;  Laterality: N/A;  . TENNIS ELBOW RELEASE/NIRSCHEL PROCEDURE  2009  . WISDOM TOOTH EXTRACTION      Family History  Problem Relation Age of Onset  . Cancer Mother 3       lymphoma  . Depression Mother   . Hypertension Father   . Heart attack Father 22  . Hypertension Brother   . High blood pressure Brother   . High Cholesterol Brother   . Healthy Daughter     Social History   Socioeconomic History  . Marital status: Married    Spouse name: Not on file  . Number of children: Not on file  . Years of education: Not on file  . Highest education level: Not on file  Occupational History  . Not on file  Social Needs  . Financial resource strain: Not on file  . Food insecurity    Worry: Not on file    Inability: Not on file  . Transportation needs     Medical: Not on file    Non-medical: Not on file  Tobacco Use  . Smoking status: Never Smoker  . Smokeless tobacco: Never Used  Substance and Sexual Activity  . Alcohol use: Yes    Frequency: Never    Comment: occ  . Drug use: Never  . Sexual activity: Not on file  Lifestyle  . Physical activity    Days per week: Not on file    Minutes per session: Not on file  . Stress: Not on file  Relationships  . Social Herbalist on phone: Not on file    Gets together: Not on file    Attends religious service: Not on file    Active member of club or organization: Not on file  Attends meetings of clubs or organizations: Not on file    Relationship status: Not on file  . Intimate partner violence    Fear of current or ex partner: Not on file    Emotionally abused: Not on file    Physically abused: Not on file    Forced sexual activity: Not on file  Other Topics Concern  . Not on file  Social History Narrative  . Not on file    Review of systems: Review of Systems  Constitutional: Negative for fever and chills.  HENT: Negative.   Eyes: Negative for blurred vision.  Respiratory: as per HPI  Cardiovascular: Negative for chest pain and palpitations.  Gastrointestinal: Negative for vomiting, diarrhea, blood per rectum. Genitourinary: Negative for dysuria, urgency, frequency and hematuria.  Musculoskeletal: Negative for myalgias, back pain and joint pain.  Skin: Negative for itching and rash.  Neurological: Negative for dizziness, tremors, focal weakness, seizures and loss of consciousness.  Endo/Heme/Allergies: Negative for environmental allergies.  Psychiatric/Behavioral: Negative for depression, suicidal ideas and hallucinations.  All other systems reviewed and are negative.  Physical Exam: Blood pressure 120/76, pulse 90, temperature (!) 97.4 F (36.3 C), temperature source Temporal, height 5' 5.5" (1.664 m), weight 273 lb 12.8 oz (124.2 kg), SpO2 95 %. Gen:       No acute distress HEENT:  EOMI, sclera anicteric Neck:     No masses; no thyromegaly Lungs:    Clear to auscultation bilaterally; normal respiratory effort CV:         Regular rate and rhythm; no murmurs Abd:      + bowel sounds; soft, non-tender; no palpable masses, no distension Ext:    No edema; adequate peripheral perfusion Skin:      Warm and dry; no rash Neuro: alert and oriented x 3 Psych: normal mood and affect  Data Reviewed: Imaging: CT high-resolution 01/01/2019- prominent elevation of right hemidiaphragm with right base atelectasis.  No interstitial lung disease.  Nonspecific scarring at the lung base.  Small hiatal hernia CTA 02/01/2019 no pulmonary embolism.  Subsegmental atelectasis.  Right hemidiaphragm elevation. I have reviewed the images personally.  PFTs: 01/15/2019 FVC 1.83 [43%], FEV1 1.42 [43%], F/F 78 Likely severe restriction  Labs: Alpha-1 antitrypsin 01/15/2019-158  CBC 06/13/2018-WBC 8.7, eos 2%, absolute eosinophil count 174 CBC 01/06/2019-WBC 5.1, eos 0% [on Fasenra]  IgE 06/21/2018-16  Asthma score ACT score 01/22/2019-20  Assessment:  Hypoxia Likely has restrictive lung disease from right hemidiaphragm elevation.  This is chronic and of unclear etiology.  Hypoxia exacerbated by recent surgery and postop atelectasis There is no evidence of interstitial lung disease or pulmonary embolism.  She is recovering appropriately from her surgery and is just on 1 L oxygen Did not desat exertion today but could not complete more than 1 lap. Told her it is okay to stop oxygen at rest.  Continue oxygen with exertion and at night Encouraged exercise regimen and weight loss.  She would like to get a breast reduction surgery in the future. Continue incentive spirometer.  Start flutter valve Schedule full pulmonary function test and home sleep study to evaluate nocturnal hypoxia.  Asthma On Symbicort and Fasenra per allergy  Plan/Recommendations: - Home sleep  study - Incentive spirometer, flutter valve - PFTs, 6-minute walk test  Marshell Garfinkel MD [facility] 01/22/2019, 8:43 AM  CC: Caren Macadam, MD

## 2019-01-22 NOTE — Patient Instructions (Signed)
We will schedule you for a home sleep study on room air Check your oxygen levels on exertion Continue the incentive spirometer We will start a flutter valve.  Use this 3 times a day We will schedule you for pulmonary function test and a 6-minute walk test  Follow-up in 1 to 2 months after pulmonary function test.

## 2019-01-22 NOTE — Telephone Encounter (Signed)
Patient seen her pulmonologist they gave her flutter valve (green one), and patient is currently taking Calcitrate with vitamin D in them most recently because she had stop taking them during her recent surgery. Patient was pleased and expressed understanding of lab results.

## 2019-01-22 NOTE — Telephone Encounter (Signed)
Please call back patient. She can get the high flow.  Immunoglobulin levels, vaccine titer to pnemococcal and tetanus/diptheria was normal which is great. Alpha-1 antitrypsin level was normal.  With these labs, I don't think she has any type of immunodeficiency issues.   Her vitamin D level was low. Is she on any type of supplement for this? If not, please let me know.  Her covid antibody test was negative as well.

## 2019-01-29 ENCOUNTER — Other Ambulatory Visit: Payer: Self-pay

## 2019-01-29 MED ORDER — BUDESONIDE-FORMOTEROL FUMARATE 160-4.5 MCG/ACT IN AERO: 2.0000 | INHALATION_SPRAY | Freq: Two times a day (BID) | RESPIRATORY_TRACT | 5 refills | Status: DC

## 2019-01-31 ENCOUNTER — Ambulatory Visit: Payer: 59

## 2019-01-31 ENCOUNTER — Other Ambulatory Visit: Payer: Self-pay

## 2019-01-31 DIAGNOSIS — R0902 Hypoxemia: Secondary | ICD-10-CM

## 2019-02-01 DIAGNOSIS — G4733 Obstructive sleep apnea (adult) (pediatric): Secondary | ICD-10-CM

## 2019-02-06 ENCOUNTER — Ambulatory Visit: Payer: 59 | Admitting: Gastroenterology

## 2019-02-10 ENCOUNTER — Encounter: Payer: Self-pay | Admitting: Family Medicine

## 2019-02-10 ENCOUNTER — Telehealth: Payer: Self-pay | Admitting: *Deleted

## 2019-02-10 ENCOUNTER — Other Ambulatory Visit: Payer: Self-pay

## 2019-02-10 ENCOUNTER — Telehealth (INDEPENDENT_AMBULATORY_CARE_PROVIDER_SITE_OTHER): Payer: 59 | Admitting: Family Medicine

## 2019-02-10 VITALS — Ht 65.5 in

## 2019-02-10 DIAGNOSIS — H00012 Hordeolum externum right lower eyelid: Secondary | ICD-10-CM

## 2019-02-10 DIAGNOSIS — G4733 Obstructive sleep apnea (adult) (pediatric): Secondary | ICD-10-CM

## 2019-02-10 NOTE — Progress Notes (Signed)
Virtual Visit via Video Note   I connected with Alyssa Clay on 02/10/19 by a video enabled telemedicine application and verified that I am speaking with the correct person using two identifiers.  Location patient: home Location provider:work office Persons participating in the virtual visit: patient, provider  I discussed the limitations of evaluation and management by telemedicine and the availability of in person appointments. The patient expressed understanding and agreed to proceed.   HPI: Alyssa Clay is a 53 yo female c/o 5-6 days of tenderness and localized erythema of right lower eye lid. No hx of trauma. Negative for sick contact or hx of trauma.  She has had same problem in the past. She has applied topical erythromycin she had left of old Rx but it does not seem to be helping. Problem is stable.  She denies fever, chills,fatigue,body aches,headache, vision changes,conjunctival erythema, eye pain or drainage, or sore throat.  She has applied warm compressed.  ROS: See pertinent positives and negatives per HPI.  Past Medical History:  Diagnosis Date  . Asthma   . Depression   . Hypertension   . Migraine     Past Surgical History:  Procedure Laterality Date  . CESAREAN SECTION  1994  . DILATION AND CURETTAGE OF UTERUS     multiple due to miscarriages  . KNEE ARTHROSCOPY Left 2013  . LAPAROSCOPIC APPENDECTOMY N/A 12/08/2018   Procedure: APPENDECTOMY LAPAROSCOPIC;  Surgeon: Donnie Mesa, MD;  Location: [facility];  Service: General;  Laterality: N/A;  . TENNIS ELBOW RELEASE/NIRSCHEL PROCEDURE  2009  . WISDOM TOOTH EXTRACTION      Family History  Problem Relation Age of Onset  . Cancer Mother 62       lymphoma  . Depression Mother   . Hypertension Father   . Heart attack Father 28  . Hypertension Brother   . High blood pressure Brother   . High Cholesterol Brother   . Healthy Daughter     Social History   Socioeconomic History  . Marital status: Married     Spouse name: Not on file  . Number of children: Not on file  . Years of education: Not on file  . Highest education level: Not on file  Occupational History  . Not on file  Social Needs  . Financial resource strain: Not on file  . Food insecurity    Worry: Not on file    Inability: Not on file  . Transportation needs    Medical: Not on file    Non-medical: Not on file  Tobacco Use  . Smoking status: Never Smoker  . Smokeless tobacco: Never Used  Substance and Sexual Activity  . Alcohol use: Yes    Frequency: Never    Comment: occ  . Drug use: Never  . Sexual activity: Not on file  Lifestyle  . Physical activity    Days per week: Not on file    Minutes per session: Not on file  . Stress: Not on file  Relationships  . Social Herbalist on phone: Not on file    Gets together: Not on file    Attends religious service: Not on file    Active member of club or organization: Not on file    Attends meetings of clubs or organizations: Not on file    Relationship status: Not on file  . Intimate partner violence    Fear of current or ex partner: Not on file    Emotionally abused: Not  on file    Physically abused: Not on file    Forced sexual activity: Not on file  Other Topics Concern  . Not on file  Social History Narrative  . Not on file      Current Outpatient Medications:  .  albuterol (PROVENTIL HFA;VENTOLIN HFA) 108 (90 Base) MCG/ACT inhaler, Inhale 2 puffs into the lungs every 6 (six) hours as needed for wheezing or shortness of breath., Disp: 1 Inhaler, Rfl: 1 .  albuterol (PROVENTIL) (2.5 MG/3ML) 0.083% nebulizer solution, Take 2.5 mg by nebulization every 6 (six) hours as needed for wheezing or shortness of breath., Disp: , Rfl:  .  budesonide-formoterol (SYMBICORT) 160-4.5 MCG/ACT inhaler, Inhale 2 puffs into the lungs 2 (two) times daily., Disp: 1 Inhaler, Rfl: 5 .  calcium carbonate (OSCAL) 1500 (600 Ca) MG TABS tablet, Take 600 mg of elemental  calcium by mouth 2 (two) times daily with a meal., Disp: , Rfl:  .  cyclobenzaprine (FLEXERIL) 10 MG tablet, Take 10 mg by mouth 3 (three) times daily as needed (migraines)., Disp: , Rfl:  .  DULoxetine (CYMBALTA) 60 MG capsule, TAKE 1 CAPSULE BY MOUTH EVERY DAY (Patient taking differently: Take 60 mg by mouth daily. ), Disp: 30 capsule, Rfl: 5 .  Erenumab-aooe (AIMOVIG) 140 MG/ML SOAJ, Inject into the skin., Disp: , Rfl:  .  FASENRA 30 MG/ML SOSY, SECOND SHIP: INJECT ONE SYRINGE UNDER THE SKIN AT WEEK 4 AND 8, THEN EVERY 8 WEEKS THEREAFTER. (Patient taking differently: Inject 30 mg into the skin See admin instructions. Every 8 weeks), Disp: 1 Syringe, Rfl: 8 .  fluticasone (FLONASE) 50 MCG/ACT nasal spray, Place 1 spray into both nostrils 2 (two) times daily., Disp: 16 g, Rfl: 5 .  gabapentin (NEURONTIN) 300 MG capsule, Take 300-900 mg by mouth See admin instructions. Take 1 tablet every AM, three tablets at bedtime, Disp: , Rfl:  .  levonorgestrel (MIRENA) 20 MCG/24HR IUD, 1 each by Intrauterine route once., Disp: , Rfl:  .  losartan (COZAAR) 50 MG tablet, TAKE 1 TABLET BY MOUTH EVERY DAY, Disp: 30 tablet, Rfl: 2 .  meloxicam (MOBIC) 7.5 MG tablet, Take 1 tablet (7.5 mg total) by mouth daily. (Patient taking differently: Take 7.5 mg by mouth as needed. ), Disp: 30 tablet, Rfl: 0 .  methocarbamol (ROBAXIN) 500 MG tablet, Take 1 tablet (500 mg total) by mouth 2 (two) times daily as needed for muscle spasms., Disp: 30 tablet, Rfl: 0 .  montelukast (SINGULAIR) 10 MG tablet, Take 1 tablet (10 mg total) by mouth daily., Disp: 30 tablet, Rfl: 5 .  Multiple Vitamin (MULTIVITAMIN WITH MINERALS) TABS tablet, Take 1 tablet by mouth daily., Disp: , Rfl:  .  pantoprazole (PROTONIX) 40 MG tablet, Take 1 tablet (40 mg total) by mouth 2 (two) times daily before a meal., Disp: 60 tablet, Rfl: 5 .  Respiratory Therapy Supplies (FLUTTER) DEVI, Use 3 times daily as directed, Disp: 1 each, Rfl: 0 .  rizatriptan (MAXALT)  10 MG tablet, Take 10 mg by mouth as needed for migraine. May repeat in 2 hours if needed, Disp: , Rfl:  .  traMADol (ULTRAM) 50 MG tablet, Take 50 mg by mouth every 6 (six) hours as needed., Disp: , Rfl:  .  traZODone (DESYREL) 100 MG tablet, Take 1-2 tablets (100-200 mg total) by mouth at bedtime as needed for sleep., Disp: 180 tablet, Rfl: 1 .  valACYclovir (VALTREX) 1000 MG tablet, Take 1 tablet (1,000 mg total) by mouth 2 (two)  times daily. (Patient taking differently: Take 1,000 mg by mouth 2 (two) times daily as needed (outbreak). ), Disp: 18 tablet, Rfl: 5  EXAM:  VITALS per patient if applicable:N/A  GENERAL: alert, oriented, appears well and in no acute distress  HEENT: atraumatic, normocephalic, conjunctiva clear, no obvious abnormalities on inspection of external nose and ears Right lower eye lid with erythema and mild edema. EOM intact bilateral.  LUNGS: on inspection no signs of respiratory distress, breathing rate appears normal, no obvious gross SOB, gasping or wheezing  CV: no obvious cyanosis  PSYCH/NEURO: pleasant and cooperative, no obvious depression or anxiety, speech and thought processing grossly intact  ASSESSMENT AND PLAN:  Discussed the following assessment and plan:  Hordeolum externum of right lower eyelid  Educated about Dx, explained that it is usually sterile lesion. Continue local heat with rice in cloth bag or wrapped in towel, warm in the microwave.  Explained it may take a couple week for lesion to resolve. Instructed about warning signs.  I discussed the assessment and treatment plan with the patient. She was provided an opportunity to ask questions and all were answered. She agreed with the plan and demonstrated an understanding of the instructions.   The patient was advised to call back or seek an in-person evaluation if the symptoms worsen or if the condition fails to improve as anticipated.  Return if symptoms worsen or fail to  improve.     [place], MD

## 2019-02-10 NOTE — Telephone Encounter (Signed)
Appt scheduled for a virtual visit today at 3:30pm with Dr Martinique.

## 2019-02-10 NOTE — Telephone Encounter (Signed)
Copied from [place] [id number]. Topic: General - Other >> Feb 10, 2019  8:51 AM Leward Quan A wrote: Reason for CRM: Patient called to inform Dr Ethlyn Gallery that she have a huge sty on her eye and thet she have been treating it for the past 5 days with Azithromycin ointment but it is getting worst and she need some antibiotics sent to the pharmacy she say so she can treat this. Please call patient at Ph#  [phone]

## 2019-02-10 NOTE — Telephone Encounter (Signed)
This really needs a visit to make sure it is nothing more urgent/deeper.

## 2019-02-11 ENCOUNTER — Ambulatory Visit: Payer: 59 | Admitting: Registered"

## 2019-02-12 ENCOUNTER — Ambulatory Visit: Payer: 59 | Admitting: Allergy

## 2019-02-12 ENCOUNTER — Telehealth: Payer: Self-pay | Admitting: Pulmonary Disease

## 2019-02-12 DIAGNOSIS — G4733 Obstructive sleep apnea (adult) (pediatric): Secondary | ICD-10-CM

## 2019-02-12 NOTE — Telephone Encounter (Signed)
Called and spoke with Patient.  Results and recommendations given. Understanding stated. Patient scheduled with AO for follow up 04/21/19 at 1130.  DME order placed.  Nothing further at this time.  Dr. Ander Slade has reviewed the home sleep test this showed mild obstructive sleep apnea.   Recommendations   Treatment options are CPAP with the settings auto 5 to 15.    Weight loss measures .   Advise against driving while sleepy & against medication with sedative side effects.    Make appointment for 3 months for compliance with download with Dr. Ander Slade.

## 2019-02-12 NOTE — Telephone Encounter (Signed)
Pt would like to speak with nurse. Has more questions about cpap machine.

## 2019-02-13 ENCOUNTER — Telehealth: Payer: Self-pay | Admitting: Pulmonary Disease

## 2019-02-13 NOTE — Telephone Encounter (Signed)
Called and spoke w/ pt. Pt expressed confusion and concern for her 02/01/2019 home sleep test results. Pt states she was given these results yesterday morning when she just woke up and she didn't have a chance to fully process what was being resulted to her. The telephone encounter from 02/13/2019 did not have pt's detailed results and it is against my scope of practice to interpret the results from pt's chart. I let her know I would get in touch with Lattie Haw, LPN, to find out more, and get back with her tomorrow 02/14/2019, as it is after 5:00 PM EDT. Pt expressed understanding.   Will route this message back to myself for f/u tomorrow.

## 2019-02-14 NOTE — Telephone Encounter (Signed)
I call recommended results from Dr. Judson Roch read sleep studies.  I document in a telephone encounter recommendations. Actual sleep study in pink folder is given to [facility] to scan into media tab.

## 2019-02-14 NOTE — Telephone Encounter (Signed)
Called and spoke w/ pt. I was able to print and view AO's results from pt's HST 02/13/2019. I reinforced her results and recommendations per AO:  Study showed mild obstructive sleep apnea with an AHI of 11.0/hour and severe oxygen desaturations.   Recommendations: >>Treatment is suggested for obstructive sleep apnea if there is presence of symptoms attributable to sleep disordered breathing.  >>If there are no symptoms attributable to sleep disordered breathing, oxygen supplementation is suggested with a follow up on oximetry to ascertain adequate supplementation.  >>If CPAP is indicated, Auto titrating CPAP with pressure settings of 5-15 will be appropriate.  >>Weight loss measures encouraged. >>Caution against driving when sleepy and against medications with sedative side effects.   Pt verbalized understanding; however expressed uncertainty about starting on CPAP. I suggested we create a f/u appt with Dr. Vaughan Browner to discuss these concerns in detail. Pt agreed to this. Appt has been scheduled for 03/04/2019 at 2:00 PM with Dr. Vaughan Browner, where pt can discuss her PFT results and these concerns. Pt expressed understanding with no additional questions. Nothing further needed at this time.

## 2019-02-17 ENCOUNTER — Ambulatory Visit: Payer: 59 | Admitting: Allergy

## 2019-02-17 NOTE — Progress Notes (Deleted)
Follow Up Note  RE: Alyssa Clay MRN: [id number] DOB: 25-Feb-1966 Date of Office Visit: 02/17/2019  Referring provider: Caren Macadam, MD Primary care provider: Caren Macadam, MD  Chief Complaint: No chief complaint on file.  History of Present Illness: I had the pleasure of seeing Alyssa Clay for a follow up visit at the [facility] on 02/17/2019. She is a 53 y.o. female, who is being followed for asthma, atelectasis, shortness of breath and history of frequent upper respiratory infection. Today she is here for regular follow up visit.  She is accompanied today by her *** who provided/contributed to the history. Her previous allergy office visit was on 01/15/2019 with Dr. Maudie Mercury.   Severe persistent asthma without complication Past history - Spiriva caused coughing.  Interim history - Asthma is doing better.   Today's spirometry showed severe restriction.   Daily controller medication(s):continue Symbicort 160 2 puffs twice a day with spacer and rinse mouth afterwards. ? Continue fasenra injections. ? Start asmanex 200 1 puff twice a day with spacer and rinse mouth afterwards for 1 month and see if it helps with your breathing.   Prior to physical activity:May use albuterol rescue inhaler 2 puffs 5 to 15 minutes prior to strenuous physical activities.  Rescue medications:May use albuterol rescue inhaler 2 puffs or nebulizer every 4 to 6 hours as needed for shortness of breath, chest tightness, coughing, and wheezing. Monitor frequency of use.   During upper respiratory infections/asthma flares: Start asmanex 200 1 puff twice a day with spacer and rinse mouth afterwards for 1 week.  Follow up with pulmonology when you get appointment.  Check for alpha-1 antitrypsin deficiency.  Atelectasis 9/9/202 CT chest showed: 1. Prominent elevation of the right hemidiaphragm with associated right lung base atelectasis including complete right  middle lobe atelectasis. Right hemidiaphragm paralysis not excluded.  The atelectasis is most likely contributing to her hypoxemia and shortness of breath.  Monitor symptoms and pulse ox.  Take oxygen with you especially when going out of the home.   Start doing flutter valve 4 times a day. Instructions given on how to perform.  Referral to pulmonology was placed at last OV and by PCP.   History of frequent upper respiratory infection History of frequent bronchitis in her lifetime.  Will obtain basic immune evaluation bloodwork.  Keep track of infections.   Shortness of breath CT chest did not show PE.   See plan as above for asthma and atelectasis.   Return in about 4 weeks (around 02/12/2019).  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  This is chronic and of unclear etiology.  Hypoxia exacerbated by recent surgery and postop atelectasis There is no evidence of interstitial lung disease or pulmonary embolism.  She is recovering appropriately from her surgery and is just on 1 L oxygen Did not desat exertion today but could not complete more than 1 lap. Told her it is okay to stop oxygen at rest.  Continue oxygen with exertion and at night Encouraged exercise regimen and weight loss.  She would like to get a breast reduction surgery in the future. Continue incentive spirometer.  Start flutter valve Schedule full pulmonary function test and home sleep study to evaluate nocturnal hypoxia.  Asthma On Symbicort and Fasenra per allergy  Assessment and Plan: [name] is a 53 y.o. female with: No problem-specific Assessment & Plan notes found for this encounter.  No follow-ups on file.  No orders  of the defined types were placed in this encounter.  Lab Orders  No laboratory test(s) ordered today    Diagnostics: Spirometry:  Tracings reviewed. Her effort: {Blank single:19197::"Good reproducible efforts.","It was hard to get  consistent efforts and there is a question as to whether this reflects a maximal maneuver.","Poor effort, data can not be interpreted."} FVC: ***L FEV1: ***L, ***% predicted FEV1/FVC ratio: ***% Interpretation: {Blank single:19197::"Spirometry consistent with mild obstructive disease","Spirometry consistent with moderate obstructive disease","Spirometry consistent with severe obstructive disease","Spirometry consistent with possible restrictive disease","Spirometry consistent with mixed obstructive and restrictive disease","Spirometry uninterpretable due to technique","Spirometry consistent with normal pattern","No overt abnormalities noted given today's efforts"}.  Please see scanned spirometry results for details.  Skin Testing: {Blank single:19197::"Select foods","Environmental allergy panel","Environmental allergy panel and select foods","Food allergy panel","None","Deferred due to recent antihistamines use"}. Positive test to: ***. Negative test to: ***.  Results discussed with patient/family.   Medication List:  Current Outpatient Medications  Medication Sig Dispense Refill  . albuterol (PROVENTIL HFA;VENTOLIN HFA) 108 (90 Base) MCG/ACT inhaler Inhale 2 puffs into the lungs every 6 (six) hours as needed for wheezing or shortness of breath. 1 Inhaler 1  . albuterol (PROVENTIL) (2.5 MG/3ML) 0.083% nebulizer solution Take 2.5 mg by nebulization every 6 (six) hours as needed for wheezing or shortness of breath.    . budesonide-formoterol (SYMBICORT) 160-4.5 MCG/ACT inhaler Inhale 2 puffs into the lungs 2 (two) times daily. 1 Inhaler 5  . calcium carbonate (OSCAL) 1500 (600 Ca) MG TABS tablet Take 600 mg of elemental calcium by mouth 2 (two) times daily with a meal.    . cyclobenzaprine (FLEXERIL) 10 MG tablet Take 10 mg by mouth 3 (three) times daily as needed (migraines).    . DULoxetine (CYMBALTA) 60 MG capsule TAKE 1 CAPSULE BY MOUTH EVERY DAY (Patient taking differently: Take 60 mg by mouth  daily. ) 30 capsule 5  . Erenumab-aooe (AIMOVIG) 140 MG/ML SOAJ Inject into the skin.    Marland Kitchen FASENRA 30 MG/ML SOSY SECOND SHIP: INJECT ONE SYRINGE UNDER THE SKIN AT WEEK 4 AND 8, THEN EVERY 8 WEEKS THEREAFTER. (Patient taking differently: Inject 30 mg into the skin See admin instructions. Every 8 weeks) 1 Syringe 8  . fluticasone (FLONASE) 50 MCG/ACT nasal spray Place 1 spray into both nostrils 2 (two) times daily. 16 g 5  . gabapentin (NEURONTIN) 300 MG capsule Take 300-900 mg by mouth See admin instructions. Take 1 tablet every AM, three tablets at bedtime    . levonorgestrel (MIRENA) 20 MCG/24HR IUD 1 each by Intrauterine route once.    Marland Kitchen losartan (COZAAR) 50 MG tablet TAKE 1 TABLET BY MOUTH EVERY DAY 30 tablet 2  . meloxicam (MOBIC) 7.5 MG tablet Take 1 tablet (7.5 mg total) by mouth daily. (Patient taking differently: Take 7.5 mg by mouth as needed. ) 30 tablet 0  . methocarbamol (ROBAXIN) 500 MG tablet Take 1 tablet (500 mg total) by mouth 2 (two) times daily as needed for muscle spasms. 30 tablet 0  . montelukast (SINGULAIR) 10 MG tablet Take 1 tablet (10 mg total) by mouth daily. 30 tablet 5  . Multiple Vitamin (MULTIVITAMIN WITH MINERALS) TABS tablet Take 1 tablet by mouth daily.    . pantoprazole (PROTONIX) 40 MG tablet Take 1 tablet (40 mg total) by mouth 2 (two) times daily before a meal. 60 tablet 5  . Respiratory Therapy Supplies (FLUTTER) DEVI Use 3 times daily as directed 1 each 0  . rizatriptan (MAXALT) 10 MG tablet Take 10 mg  by mouth as needed for migraine. May repeat in 2 hours if needed    . traMADol (ULTRAM) 50 MG tablet Take 50 mg by mouth every 6 (six) hours as needed.    . traZODone (DESYREL) 100 MG tablet Take 1-2 tablets (100-200 mg total) by mouth at bedtime as needed for sleep. 180 tablet 1  . valACYclovir (VALTREX) 1000 MG tablet Take 1 tablet (1,000 mg total) by mouth 2 (two) times daily. (Patient taking differently: Take 1,000 mg by mouth 2 (two) times daily as needed  (outbreak). ) 18 tablet 5   No current facility-administered medications for this visit.    Allergies: No Known Allergies I reviewed her past medical history, social history, family history, and environmental history and no significant changes have been reported from her previous visit.  Review of Systems  Constitutional: Positive for fever. Negative for appetite change, chills and unexpected weight change.  HENT: Negative for congestion, postnasal drip and rhinorrhea.   Eyes: Negative for itching.  Respiratory: Positive for cough and shortness of breath. Negative for chest tightness and wheezing.   Gastrointestinal: Positive for abdominal pain.  Skin: Negative for rash.  Allergic/Immunologic: Negative for environmental allergies.  Neurological: Positive for headaches.   Objective: There were no vitals taken for this visit. There is no height or weight on file to calculate BMI. Physical Exam  Constitutional: She is oriented to person, place, and time. She appears well-developed and well-nourished.  HENT:  Head: Normocephalic and atraumatic.  Right Ear: External ear normal.  Left Ear: External ear normal.  Nose: Nose normal.  Mouth/Throat: Oropharynx is clear and moist.  Eyes: Conjunctivae and EOM are normal.  Neck: Neck supple.  Cardiovascular: Normal rate, regular rhythm and normal heart sounds. Exam reveals no gallop and no friction rub.  No murmur heard. Pulmonary/Chest: Effort normal and breath sounds normal. She has no wheezes. She has no rales.  Neurological: She is alert and oriented to person, place, and time.  Skin: Skin is warm. No rash noted.  Psychiatric: She has a normal mood and affect. Her behavior is normal.  Nursing note and vitals reviewed.  Previous notes and tests were reviewed. The plan was reviewed with the patient/family, and all questions/concerned were addressed.  It was my pleasure to see Tyrihanna today and participate in her care. Please feel free to  contact me with any questions or concerns.  Sincerely,  Rexene Alberts, DO Allergy & Immunology  [facility] office: [phone] [facility] office: [place] office: [phone]

## 2019-02-27 ENCOUNTER — Other Ambulatory Visit (HOSPITAL_COMMUNITY)
Admission: RE | Admit: 2019-02-27 | Discharge: 2019-02-27 | Disposition: A | Discharge status: Home / Self Care | Payer: 59 | Source: Ambulatory Visit | Attending: Pulmonary Disease | Admitting: Pulmonary Disease

## 2019-02-27 DIAGNOSIS — Z20828 Contact with and (suspected) exposure to other viral communicable diseases: Secondary | ICD-10-CM | POA: Diagnosis not present

## 2019-02-27 DIAGNOSIS — Z01812 Encounter for preprocedural laboratory examination: Secondary | ICD-10-CM | POA: Diagnosis present

## 2019-02-28 ENCOUNTER — Institutional Professional Consult (permissible substitution): Payer: 59 | Admitting: Plastic Surgery

## 2019-02-28 LAB — NOVEL CORONAVIRUS, NAA (HOSP ORDER, SEND-OUT TO REF LAB; TAT 18-24 HRS): SARS-CoV-2, NAA: NOT DETECTED

## 2019-03-03 ENCOUNTER — Ambulatory Visit: Payer: 59 | Admitting: Pulmonary Disease

## 2019-03-03 ENCOUNTER — Other Ambulatory Visit: Payer: Self-pay

## 2019-03-03 DIAGNOSIS — J4540 Moderate persistent asthma, uncomplicated: Secondary | ICD-10-CM

## 2019-03-03 LAB — PULMONARY FUNCTION TEST
DL/VA % pred: 145 %
DL/VA: 6.15 ml/min/mmHg/L
DLCO unc % pred: 83 %
DLCO unc: 19.02 ml/min/mmHg
FEF 25-75 Post: 1.83 L/sec
FEF 25-75 Pre: 1.8 L/sec
FEF2575-%Change-Post: 1 %
FEF2575-%Pred-Post: 63 %
FEF2575-%Pred-Pre: 62 %
FEV1-%Change-Post: 0 %
FEV1-%Pred-Post: 51 %
FEV1-%Pred-Pre: 51 %
FEV1-Post: 1.56 L
FEV1-Pre: 1.57 L
FEV1FVC-%Change-Post: 3 %
FEV1FVC-%Pred-Pre: 106 %
FEV6-%Change-Post: -3 %
FEV6-%Pred-Post: 47 %
FEV6-%Pred-Pre: 49 %
FEV6-Post: 1.79 L
FEV6-Pre: 1.85 L
FEV6FVC-%Pred-Post: 102 %
FEV6FVC-%Pred-Pre: 102 %
FVC-%Change-Post: -3 %
FVC-%Pred-Post: 46 %
FVC-%Pred-Pre: 48 %
FVC-Post: 1.79 L
FVC-Pre: 1.85 L
Post FEV1/FVC ratio: 87 %
Post FEV6/FVC ratio: 100 %
Pre FEV1/FVC ratio: 85 %
Pre FEV6/FVC Ratio: 100 %
RV % pred: 73 %
RV: 1.42 L
TLC % pred: 63 %
TLC: 3.46 L

## 2019-03-04 ENCOUNTER — Ambulatory Visit (INDEPENDENT_AMBULATORY_CARE_PROVIDER_SITE_OTHER): Payer: 59 | Admitting: Pulmonary Disease

## 2019-03-04 ENCOUNTER — Encounter: Payer: Self-pay | Admitting: Dietician

## 2019-03-04 ENCOUNTER — Encounter: Payer: 59 | Attending: Family Medicine | Admitting: Dietician

## 2019-03-04 ENCOUNTER — Ambulatory Visit (INDEPENDENT_AMBULATORY_CARE_PROVIDER_SITE_OTHER): Payer: 59

## 2019-03-04 ENCOUNTER — Encounter: Payer: Self-pay | Admitting: Pulmonary Disease

## 2019-03-04 VITALS — BP 120/82 | HR 87 | Temp 98.3°F | Ht 65.5 in | Wt 273.0 lb

## 2019-03-04 DIAGNOSIS — R0902 Hypoxemia: Secondary | ICD-10-CM

## 2019-03-04 DIAGNOSIS — J4540 Moderate persistent asthma, uncomplicated: Secondary | ICD-10-CM | POA: Diagnosis not present

## 2019-03-04 DIAGNOSIS — E669 Obesity, unspecified: Secondary | ICD-10-CM | POA: Insufficient documentation

## 2019-03-04 NOTE — Progress Notes (Signed)
Medical Nutrition Therapy  Appt Start Time: 11:00am   End Time: 12:05pm   Primary concerns today: meal planning, prediabetes  Referral diagnosis: E66.01 obesity Preferred learning style: no preference indicated Learning readiness: change in progress   NUTRITION ASSESSMENT   Clinical Medical Hx: obesity, migraine, HTN, depression, asthma, prediabetes Labs: A1c 6.1%  Lifestyle & Dietary Hx Patient lives with her husband and their 51 year old son. 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 Patient is very knowledgeable about nutrition and asked many questions during the visit. Patient was previously seen at [facility] about a year ago for nutrition education.   Typical intake is 3 meals plus 1-2 snacks per day. Grains are whole grain (noodles, bread, rolls, etc.) No dairy other than half and half creamer, some cheeses, and Lactaid milk. May have a Kind bar for breakfast if in a hurry, asked if packaged instant oatmeal is a good breakfast option. Lunch or dinner is typically lean meat with vegetables. States she is a picky eater, particularly with textures. Does not eat [place] food, [place] food, avocados, most raw vegetables, yogurt.   Asked many questions regarding appropriate calorie intake, overall food intake, portion sizes, good breakfast and snack ideas, and if certain foods are healthy options.   Estimated daily fluid intake: 64+ oz Supplements: MVI  Sleep: 7 hours, decent quality (recent sleep study indicates slight sleep apnea)  Stress / self-care: not stressed  Current average weekly physical activity: elliptical, light housekeeping   24-Hr Dietary Recall First Meal: peanut butter + toast + + fruit + decaf coffee w/ cream  Snack: cashews  Second Meal: roasted chicken + vegetable  Snack: hard boiled egg  Third Meal: filet + vegetable   Snack: stove top popcorn w/ olive oil  Beverages: water, occasionally wine, coffee    Estimated Energy  Needs Calories: 1600-1800 Carbohydrate: 180-200g Protein: 100-113g Fat: 53-60g   NUTRITION DIAGNOSIS  Food- and nutrition-related knowledge deficit (NB-1.1) related to new diagnosis of prediabetes as evidenced by questions regarding diet and nutrition in the context of prediabetes.    NUTRITION INTERVENTION  Nutrition education (E-1) on the following topics:  . Balanced, healthful diet: variety of foods, limit added sugars, increase vegetable intake, lean proteins, small frequent meals  . Mindfulness/ intuitive eating: eating without distractions, listening to fullness/hunger cues, eating slowly, use small bowls/plates   Handouts Provided Include   Meal Ideas, Breakfast Ideas, Balanced Snacks   Sample Meal Plan (1,600-1,800 kcals/day)   Learning Style & Readiness for Change Teaching method utilized: Visual & Auditory  Demonstrated degree of understanding via: Teach Back  Barriers to learning/adherence to lifestyle change: None Identified    MONITORING & EVALUATION Dietary intake, weekly physical activity, and goals prn.  RD's Notes for Next Visit  . Physical Activity  . Fiber Intake  . Types of Fat  Next Steps  Patient is to contact [facility] to schedule follow up visits as needed.

## 2019-03-04 NOTE — Progress Notes (Signed)
SIX MIN WALK 03/04/2019 01/22/2019  Medications None -  Supplimental Oxygen during Test? (L/min) No No  Laps 9 -  Partial Lap (in Meters) 14 -  Baseline BP (sitting) 110/64 -  Baseline Heartrate 93 -  Baseline Dyspnea (Borg Scale) 1 -  Baseline Fatigue (Borg Scale) 3 -  Baseline SPO2 96 -  BP (sitting) 130/72 -  Heartrate 117 -  Dyspnea (Borg Scale) 5 -  Fatigue (Borg Scale) 3 -  SPO2 100 -  BP (sitting) 112/70 -  Heartrate 95 -  SPO2 99 -  Stopped or Paused before Six Minutes No -  Distance Completed 320 -  Tech Comments: Desmond Dike, CMA pt. walked at steady pace. she could only do 1 lap due to becoming dizzy after first lap. ER

## 2019-03-04 NOTE — Patient Instructions (Signed)
I have reviewed the pulmonary function test which shows restriction which means that your lung is not completely expanded which is likely from the diaphragm elevation Your overnight oximetry shows low oxygen levels and very mild sleep apnea.  After discussion today I feel that we can avoid the CPAP if you can work on weight loss with diet and exercise Continue supplemental oxygen at night with 3 L You do not need oxygen during the daytime as your oxygen levels on walk remain okay  We will order an overnight oximetry in 2 months time  Follow-up in clinic in 3 months.

## 2019-03-04 NOTE — Progress Notes (Signed)
Alyssa Clay    [id number]    Jan 10, 1966  Primary Care Physician:Koberlein, Steele Berg, MD  Referring Physician: Caren Macadam, MD [place],  [place] [id number]  Chief complaint: Follow-up for hypoxia, asthma, restrictive lung disease  HPI: 53 year old with severe persistent asthma, right hemidiaphragm paralysis, depression, hypertension, migraine Hospitalized in August 2020 due to appendicitis with perforation status post exploratory laparotomy.  Postop course complicated by AKI, fluid overload, hypoxia, respiratory failure.  She was diuresed and discharged on supplemental oxygen.  She continues on 1 L oxygen which increases to 4 L while on the elliptical.  Also uses 1 L oxygen at night.  She had been evaluated for sleep apnea in 2005 and was told she had a negative study  She has history of severe persistent asthma and follows with allergy, immunology.  Started on [place] in June 2020. History notable for right hemidiaphragm paralysis.  This was diagnosed in 2005 but probably had been present for many years before.  Denies any surgeries or trauma to the chest or neck.  Also has recurrent bronchitis.  No history of premature birth or low birthweight.  Pets: Has a dog.  No cats, birds, farm animals Occupation: Currently disabled.  Used to work as a Airline pilot Exposures: No known exposures.  No mold, hot tub, Jacuzzi Smoking history: Never smoker Travel history: Originally from [name].  Previously lived in [place].  Moved to [place] in 2019 Relevant family history: Has family history of asthma.  No other significant family history of lung disease.  Interim history: States that she is doing well.  Not using the oxygen during the day as her sats remain okay She had a recent sleep study that showed mild sleep apnea and is here for review  Outpatient Encounter Medications as of 03/04/2019  Medication Sig  . albuterol  (PROVENTIL HFA;VENTOLIN HFA) 108 (90 Base) MCG/ACT inhaler Inhale 2 puffs into the lungs every 6 (six) hours as needed for wheezing or shortness of breath.  Marland Kitchen albuterol (PROVENTIL) (2.5 MG/3ML) 0.083% nebulizer solution Take 2.5 mg by nebulization every 6 (six) hours as needed for wheezing or shortness of breath.  . budesonide-formoterol (SYMBICORT) 160-4.5 MCG/ACT inhaler Inhale 2 puffs into the lungs 2 (two) times daily.  . calcium carbonate (OSCAL) 1500 (600 Ca) MG TABS tablet Take 600 mg of elemental calcium by mouth 2 (two) times daily with a meal.  . cyclobenzaprine (FLEXERIL) 10 MG tablet Take 10 mg by mouth 3 (three) times daily as needed (migraines).  . DULoxetine (CYMBALTA) 60 MG capsule TAKE 1 CAPSULE BY MOUTH EVERY DAY (Patient taking differently: Take 60 mg by mouth daily. )  . Erenumab-aooe (AIMOVIG) 140 MG/ML SOAJ Inject into the skin.  Marland Kitchen FASENRA 30 MG/ML SOSY SECOND SHIP: INJECT ONE SYRINGE UNDER THE SKIN AT WEEK 4 AND 8, THEN EVERY 8 WEEKS THEREAFTER. (Patient taking differently: Inject 30 mg into the skin See admin instructions. Every 8 weeks)  . fluticasone (FLONASE) 50 MCG/ACT nasal spray Place 1 spray into both nostrils 2 (two) times daily.  Marland Kitchen gabapentin (NEURONTIN) 300 MG capsule Take 300-900 mg by mouth See admin instructions. Take 1 tablet every AM, three tablets at bedtime  . levonorgestrel (MIRENA) 20 MCG/24HR IUD 1 each by Intrauterine route once.  Marland Kitchen losartan (COZAAR) 50 MG tablet TAKE 1 TABLET BY MOUTH EVERY DAY  . montelukast (SINGULAIR) 10 MG tablet Take 1 tablet (10 mg total) by  mouth daily.  . Multiple Vitamin (MULTIVITAMIN WITH MINERALS) TABS tablet Take 1 tablet by mouth daily.  . pantoprazole (PROTONIX) 40 MG tablet Take 1 tablet (40 mg total) by mouth 2 (two) times daily before a meal.  . Respiratory Therapy Supplies (FLUTTER) DEVI Use 3 times daily as directed  . rizatriptan (MAXALT) 10 MG tablet Take 10 mg by mouth as needed for migraine. May repeat in 2 hours if  needed  . traZODone (DESYREL) 100 MG tablet Take 1-2 tablets (100-200 mg total) by mouth at bedtime as needed for sleep.  . valACYclovir (VALTREX) 1000 MG tablet Take 1 tablet (1,000 mg total) by mouth 2 (two) times daily. (Patient taking differently: Take 1,000 mg by mouth 2 (two) times daily as needed (outbreak). )  . [DISCONTINUED] meloxicam (MOBIC) 7.5 MG tablet Take 1 tablet (7.5 mg total) by mouth daily. (Patient taking differently: Take 7.5 mg by mouth as needed. )  . [DISCONTINUED] methocarbamol (ROBAXIN) 500 MG tablet Take 1 tablet (500 mg total) by mouth 2 (two) times daily as needed for muscle spasms.  . [DISCONTINUED] traMADol (ULTRAM) 50 MG tablet Take 50 mg by mouth every 6 (six) hours as needed.   No facility-administered encounter medications on file as of 03/04/2019.    Physical Exam: Blood pressure 120/76, pulse 90, temperature (!) 97.4 F (36.3 C), temperature source Temporal, height 5' 5.5" (1.664 m), weight 273 lb 12.8 oz (124.2 kg), SpO2 95 %. Gen:      No acute distress HEENT:  EOMI, sclera anicteric Neck:     No masses; no thyromegaly Lungs:    Clear to auscultation bilaterally; normal respiratory effort CV:         Regular rate and rhythm; no murmurs Abd:      + bowel sounds; soft, non-tender; no palpable masses, no distension Ext:    No edema; adequate peripheral perfusion Skin:      Warm and dry; no rash Neuro: alert and oriented x 3 Psych: normal mood and affect  Data Reviewed: Imaging: CT high-resolution 01/01/2019- prominent elevation of right hemidiaphragm with right base atelectasis.  No interstitial lung disease.  Nonspecific scarring at the lung base.  Small hiatal hernia CTA 02/01/2019 no pulmonary embolism.  Subsegmental atelectasis.  Right hemidiaphragm elevation. I have reviewed the images personally.  PFTs: 01/15/2019 FVC 1.83 [43%], FEV1 1.42 [43%], F/F 78 Likely severe restriction  03/03/2019 FVC 1.79 [46%], FEV1 1.56 [51%], F/F 87, TLC 3.46  [63%], DLCO 19.02 [83%]. 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  This is chronic and of unclear etiology.  Hypoxia exacerbated by recent surgery and postop atelectasis There is no evidence of interstitial lung disease or pulmonary embolism.  She can stop oxygen during the daytime and she did not desat on 6-minute walk test Will need to continue oxygen at night due to nocturnal hypoxia  Continue incentive spirometer,  flutter valve  Sleep apnea PSG reviewed with mild sleep apnea and desaturation Discussed use of CPAP.  Patient would like to avoid if possible. Given mild AHI and no symptoms of daytime somnolence will hold off  CPAP therapy  Suspect nocturnal hypoxia due to right diaphragm elevation. Continue 3 L oxygen at night.  Check overnight oximetry  Encouraged exercise regimen and weight loss.  She would like to get a breast reduction surgery in the future.  Asthma On Symbicort and Fasenra per allergy  Plan/Recommendations: - Continue nocturnal oxygen, check overnight oximetry - Incentive spirometer, flutter valve - Weight loss - Continue Symbicort, Barbette Reichmann MD [facility] 03/04/2019, 2:21 PM  CC: Caren Macadam, MD

## 2019-03-10 ENCOUNTER — Ambulatory Visit: Payer: 59 | Admitting: Gastroenterology

## 2019-03-12 ENCOUNTER — Ambulatory Visit: Payer: Self-pay

## 2019-03-19 ENCOUNTER — Other Ambulatory Visit: Payer: Self-pay

## 2019-03-19 ENCOUNTER — Ambulatory Visit (INDEPENDENT_AMBULATORY_CARE_PROVIDER_SITE_OTHER): Payer: 59 | Admitting: *Deleted

## 2019-03-19 ENCOUNTER — Ambulatory Visit: Payer: Self-pay

## 2019-03-19 DIAGNOSIS — J4550 Severe persistent asthma, uncomplicated: Secondary | ICD-10-CM | POA: Diagnosis not present

## 2019-03-19 MED ORDER — BENRALIZUMAB 30 MG/ML ~~LOC~~ SOSY
30.0000 mg | PREFILLED_SYRINGE | Freq: Once | SUBCUTANEOUS | Status: AC
  Administered 2019-03-19: 30 mg via SUBCUTANEOUS

## 2019-03-31 ENCOUNTER — Encounter: Payer: Self-pay | Admitting: Family Medicine

## 2019-04-08 ENCOUNTER — Other Ambulatory Visit: Payer: Self-pay | Admitting: Family Medicine

## 2019-04-09 ENCOUNTER — Encounter: Payer: Self-pay | Admitting: Family Medicine

## 2019-04-14 ENCOUNTER — Telehealth: Payer: Self-pay | Admitting: Plastic Surgery

## 2019-04-14 NOTE — Telephone Encounter (Signed)

## 2019-04-15 ENCOUNTER — Other Ambulatory Visit: Payer: Self-pay

## 2019-04-15 ENCOUNTER — Encounter: Payer: Self-pay | Admitting: Plastic Surgery

## 2019-04-15 ENCOUNTER — Ambulatory Visit (INDEPENDENT_AMBULATORY_CARE_PROVIDER_SITE_OTHER): Payer: Managed Care, Other (non HMO) | Admitting: Plastic Surgery

## 2019-04-15 DIAGNOSIS — M549 Dorsalgia, unspecified: Secondary | ICD-10-CM | POA: Insufficient documentation

## 2019-04-15 DIAGNOSIS — M542 Cervicalgia: Secondary | ICD-10-CM | POA: Diagnosis not present

## 2019-04-15 DIAGNOSIS — N62 Hypertrophy of breast: Secondary | ICD-10-CM | POA: Insufficient documentation

## 2019-04-15 DIAGNOSIS — M546 Pain in thoracic spine: Secondary | ICD-10-CM | POA: Diagnosis not present

## 2019-04-15 DIAGNOSIS — G8929 Other chronic pain: Secondary | ICD-10-CM

## 2019-04-15 NOTE — Progress Notes (Signed)
Patient ID: Alyssa Clay, female    DOB: 1965-07-02, 53 y.o.   MRN: [id number]   Chief Complaint  Patient presents with  . Breast Problem    Mammary Hyperplasia: The patient is a 53 y.o. female with a history of mammary hyperplasia for several years.  She has extremely large breasts causing symptoms that include the following: Back pain in the upper and lower back, including neck pain. She pulls or pins her bra straps to provide better lift and relief of the pressure and pain. She notices relief by holding her breast up manually.  Her shoulder straps cause grooves and pain and pressure that requires padding for relief. Pain medication is sometimes required with motrin and tylenol.  Activities that are hindered by enlarged breasts include: exercise and running.  She even has trouble walking her dog.  Her breasts are extremely large and fairly symmetric.  She has hyperpigmentation of the inframammary area on both sides.  The sternal to nipple distance on the right is 45 cm and the left is 45 cm.  The IMF distance is 20+ cm.  She is 5 feet 5 inches tall and weighs 282 pounds.  Preoperative bra size = 44H cup.  She would like to be anything smaller possibly a C or a B cup.  The estimated excess breast tissue to be removed at the time of surgery = 1200 grams on the left and 1200 grams on the right.  Mammogram history: She had a mammogram over a year and a half ago which was normal.  She will need to have an updated one prior to any surgery.  She had physical therapy many years ago for her neck and her back.  It helped a little but did not sustain the progress due to her breast size and weight.  She is not a smoker.    Review of Systems  Constitutional: Positive for activity change.  Eyes: Negative.   Respiratory: Negative.  Negative for chest tightness and shortness of breath.   Gastrointestinal: Negative for abdominal distention and abdominal pain.  Endocrine: Negative.     Genitourinary: Negative.   Musculoskeletal: Positive for back pain and neck pain.  Skin: Positive for color change. Negative for wound.  Hematological: Negative.   Psychiatric/Behavioral: Negative.     Past Medical History:  Diagnosis Date  . Asthma   . Depression   . Hypertension   . Migraine     Past Surgical History:  Procedure Laterality Date  . CESAREAN SECTION  1994  . DILATION AND CURETTAGE OF UTERUS     multiple due to miscarriages  . KNEE ARTHROSCOPY Left 2013  . LAPAROSCOPIC APPENDECTOMY N/A 12/08/2018   Procedure: APPENDECTOMY LAPAROSCOPIC;  Surgeon: Donnie Mesa, MD;  Location: [facility];  Service: General;  Laterality: N/A;  . TENNIS ELBOW RELEASE/NIRSCHEL PROCEDURE  2009  . WISDOM TOOTH EXTRACTION        Current Outpatient Medications:  .  albuterol (PROVENTIL HFA;VENTOLIN HFA) 108 (90 Base) MCG/ACT inhaler, Inhale 2 puffs into the lungs every 6 (six) hours as needed for wheezing or shortness of breath., Disp: 1 Inhaler, Rfl: 1 .  albuterol (PROVENTIL) (2.5 MG/3ML) 0.083% nebulizer solution, Take 2.5 mg by nebulization every 6 (six) hours as needed for wheezing or shortness of breath., Disp: , Rfl:  .  budesonide-formoterol (SYMBICORT) 160-4.5 MCG/ACT inhaler, Inhale 2 puffs into the lungs 2 (two) times daily., Disp: 1 Inhaler, Rfl: 5 .  calcium carbonate (OSCAL) 1500 (600 Ca)  MG TABS tablet, Take 600 mg of elemental calcium by mouth 2 (two) times daily with a meal., Disp: , Rfl:  .  cyclobenzaprine (FLEXERIL) 10 MG tablet, Take 10 mg by mouth 3 (three) times daily as needed (migraines)., Disp: , Rfl:  .  DULoxetine (CYMBALTA) 60 MG capsule, TAKE 1 CAPSULE BY MOUTH EVERY DAY (Patient taking differently: Take 60 mg by mouth daily. ), Disp: 30 capsule, Rfl: 5 .  Erenumab-aooe (AIMOVIG) 140 MG/ML SOAJ, Inject into the skin., Disp: , Rfl:  .  FASENRA 30 MG/ML SOSY, SECOND SHIP: INJECT ONE SYRINGE UNDER THE SKIN AT WEEK 4 AND 8, THEN EVERY 8 WEEKS THEREAFTER. (Patient  taking differently: Inject 30 mg into the skin See admin instructions. Every 8 weeks), Disp: 1 Syringe, Rfl: 8 .  fluticasone (FLONASE) 50 MCG/ACT nasal spray, Place 1 spray into both nostrils 2 (two) times daily., Disp: 16 g, Rfl: 5 .  gabapentin (NEURONTIN) 300 MG capsule, Take 300-900 mg by mouth See admin instructions. Take 1 tablet every AM, three tablets at bedtime, Disp: , Rfl:  .  levonorgestrel (MIRENA) 20 MCG/24HR IUD, 1 each by Intrauterine route once., Disp: , Rfl:  .  losartan (COZAAR) 50 MG tablet, TAKE 1 TABLET BY MOUTH EVERY DAY, Disp: 30 tablet, Rfl: 2 .  montelukast (SINGULAIR) 10 MG tablet, Take 1 tablet (10 mg total) by mouth daily., Disp: 30 tablet, Rfl: 5 .  Multiple Vitamin (MULTIVITAMIN WITH MINERALS) TABS tablet, Take 1 tablet by mouth daily., Disp: , Rfl:  .  pantoprazole (PROTONIX) 40 MG tablet, Take 1 tablet (40 mg total) by mouth 2 (two) times daily before a meal., Disp: 60 tablet, Rfl: 5 .  Respiratory Therapy Supplies (FLUTTER) DEVI, Use 3 times daily as directed, Disp: 1 each, Rfl: 0 .  rizatriptan (MAXALT) 10 MG tablet, Take 10 mg by mouth as needed for migraine. May repeat in 2 hours if needed, Disp: , Rfl:  .  traZODone (DESYREL) 100 MG tablet, Take 1-2 tablets (100-200 mg total) by mouth at bedtime as needed for sleep., Disp: 180 tablet, Rfl: 1 .  valACYclovir (VALTREX) 1000 MG tablet, Take 1 tablet (1,000 mg total) by mouth 2 (two) times daily. (Patient taking differently: Take 1,000 mg by mouth 2 (two) times daily as needed (outbreak). ), Disp: 18 tablet, Rfl: 5   Objective:   Vitals:   04/15/19 1053  BP: 137/81  Pulse: (!) 106  Temp: 98 F (36.7 C)  SpO2: 92%    Physical Exam Vitals and nursing note reviewed.  Constitutional:      Appearance: Normal appearance.  HENT:     Head: Normocephalic and atraumatic.  Cardiovascular:     Rate and Rhythm: Normal rate.     Pulses: Normal pulses.  Pulmonary:     Effort: Pulmonary effort is normal. No  respiratory distress.  Abdominal:     General: Abdomen is flat. There is no distension.     Tenderness: There is no abdominal tenderness.  Skin:    General: Skin is warm.     Capillary Refill: Capillary refill takes less than 2 seconds.  Neurological:     General: No focal deficit present.     Mental Status: She is alert and oriented to person, place, and time.  Psychiatric:        Mood and Affect: Mood normal.        Behavior: Behavior normal.        Thought Content: Thought content normal.  Assessment & Plan:  Symptomatic mammary hypertrophy  Chronic bilateral thoracic back pain  Neck pain The patient is a good candidate for bilateral breast reduction.  She needs an updated mammogram and physical therapy.  I had like to see her back after she has the therapy.  Pictures were obtained of the patient and placed in the chart with the patient's or guardian's permission.  Wallace Going, DO   The 21st Century Cures Act was signed into law in 2016 which includes the topic of electronic health records.  This provides immediate access to information in MyChart.  This includes consultation notes, operative notes, office notes, lab results and pathology reports.  If you have any questions about what you read please let us know at your next visit or call us at the office.  We are right here with you.

## 2019-04-16 ENCOUNTER — Telehealth: Payer: Self-pay | Admitting: Pulmonary Disease

## 2019-04-16 ENCOUNTER — Ambulatory Visit
Admission: RE | Admit: 2019-04-16 | Discharge: 2019-04-16 | Disposition: A | Discharge status: Home / Self Care | Payer: Managed Care, Other (non HMO) | Source: Ambulatory Visit | Attending: Plastic Surgery | Admitting: Plastic Surgery

## 2019-04-16 DIAGNOSIS — N62 Hypertrophy of breast: Secondary | ICD-10-CM

## 2019-04-16 DIAGNOSIS — M546 Pain in thoracic spine: Secondary | ICD-10-CM

## 2019-04-16 DIAGNOSIS — G8929 Other chronic pain: Secondary | ICD-10-CM

## 2019-04-16 DIAGNOSIS — M542 Cervicalgia: Secondary | ICD-10-CM

## 2019-04-16 IMAGING — MG DIGITAL SCREENING BILAT W/ CAD
8 series · 8 of 8 positions shown · non-contrast
Comparison: Previous exam(s).

CLINICAL DATA: Screening.

EXAM:
DIGITAL SCREENING BILATERAL MAMMOGRAM WITH CAD

[R CC (1 of 2)]
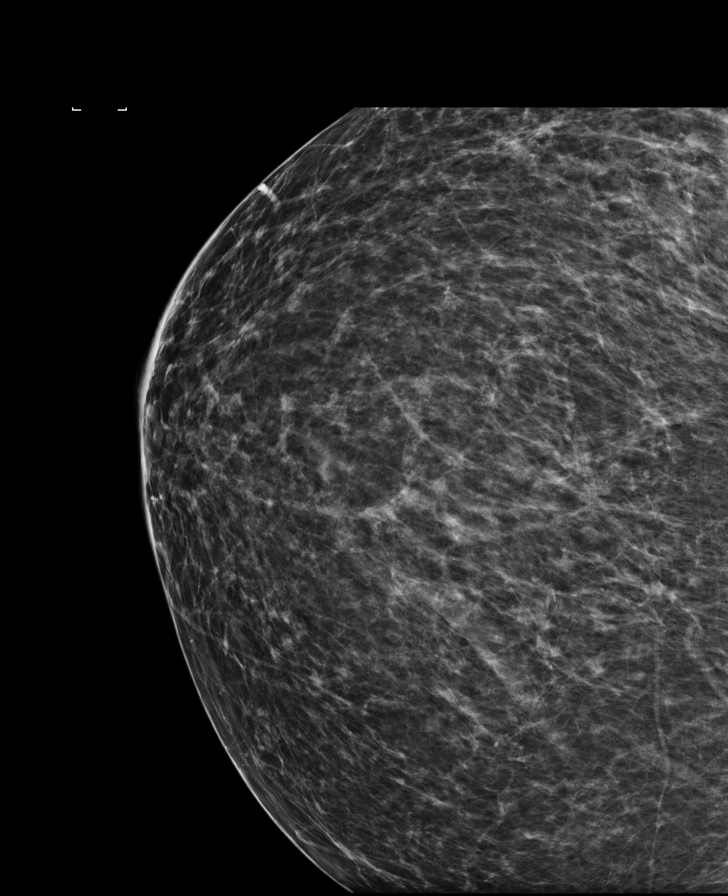

[L MLO (1 of 2)]
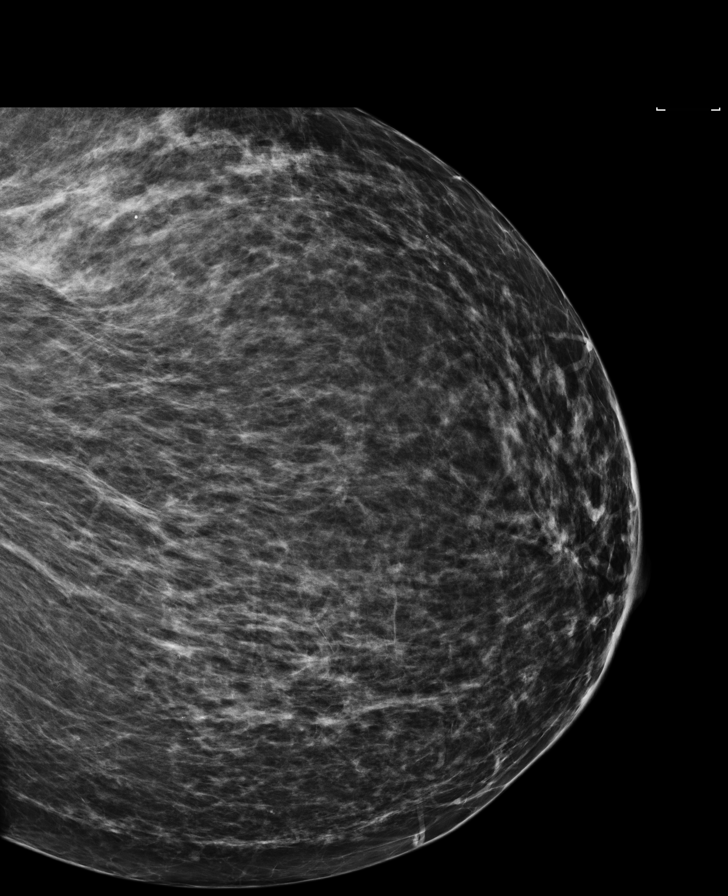

[L MLO (2 of 2)]
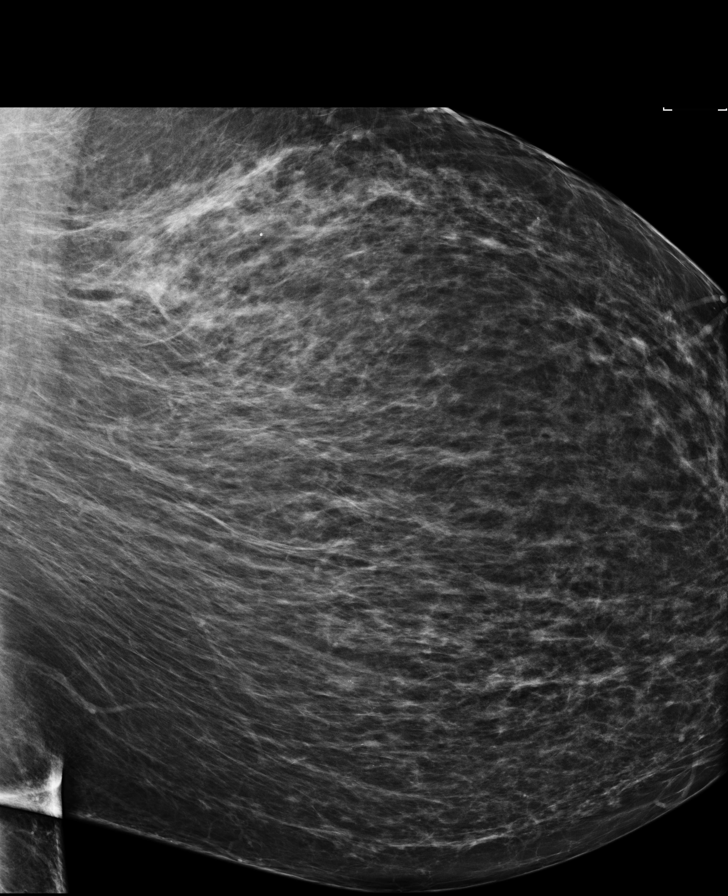

[R MLO (1 of 2)]
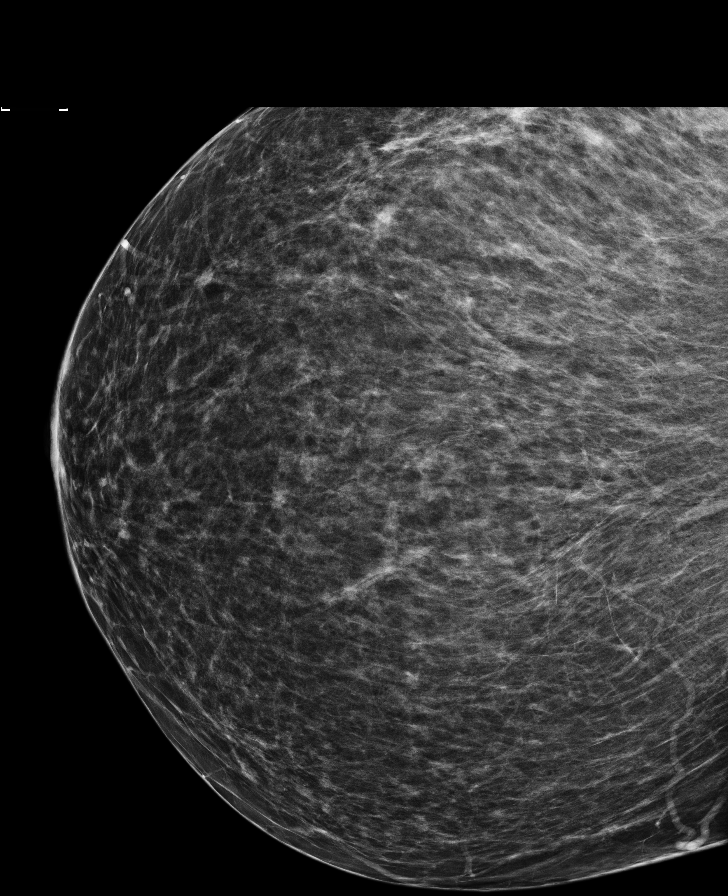

[L CV]
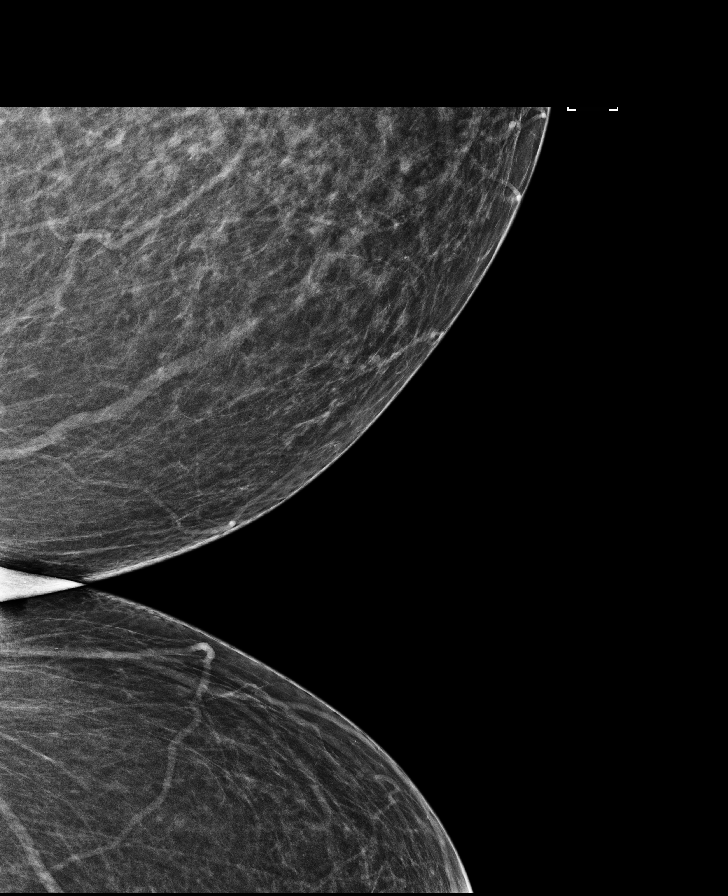

[R MLO (2 of 2)]
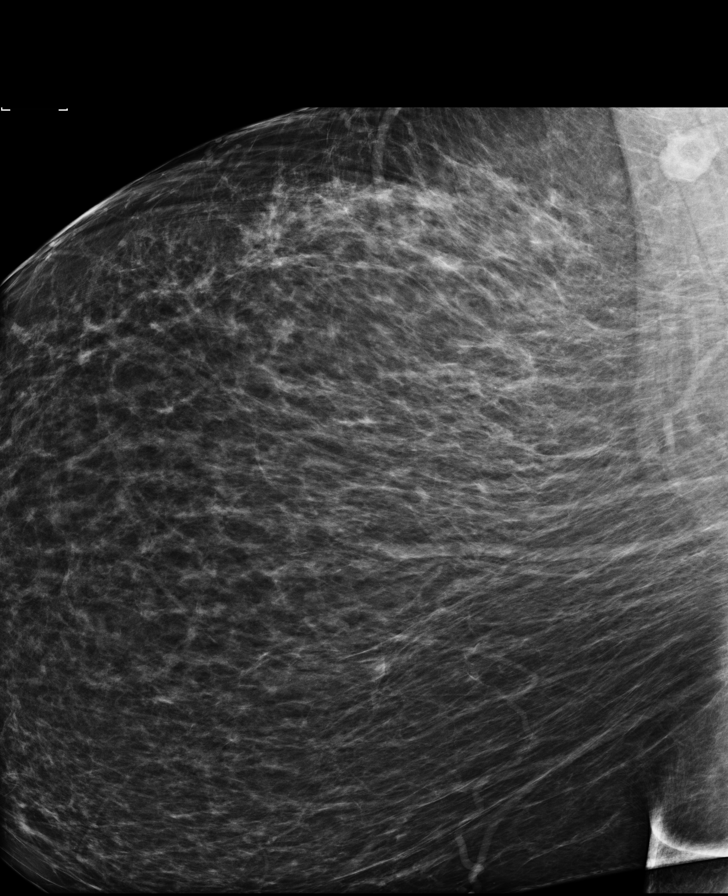

[R CC (2 of 2)]
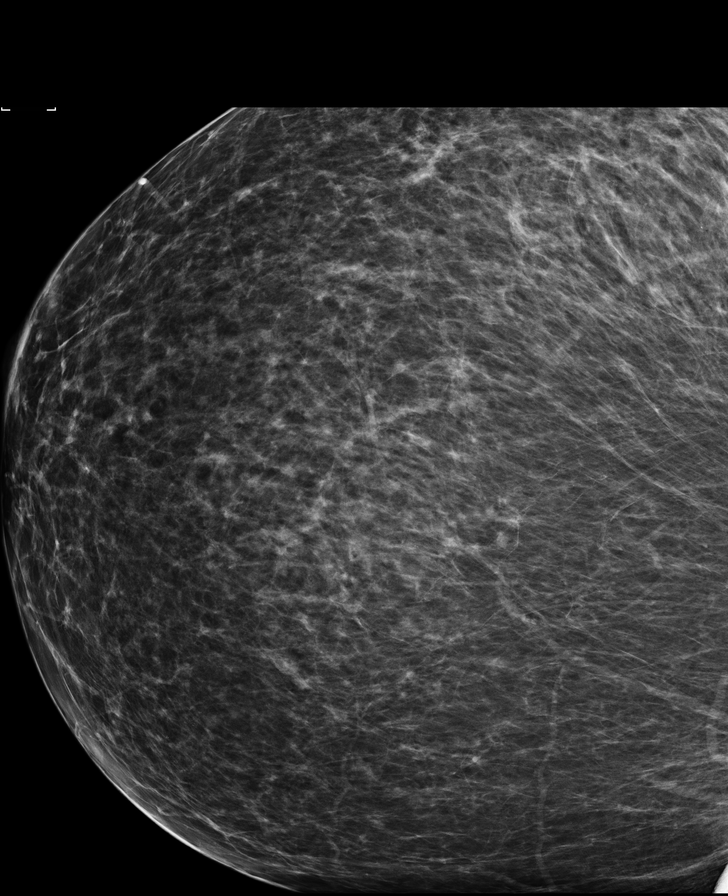

[L CC]
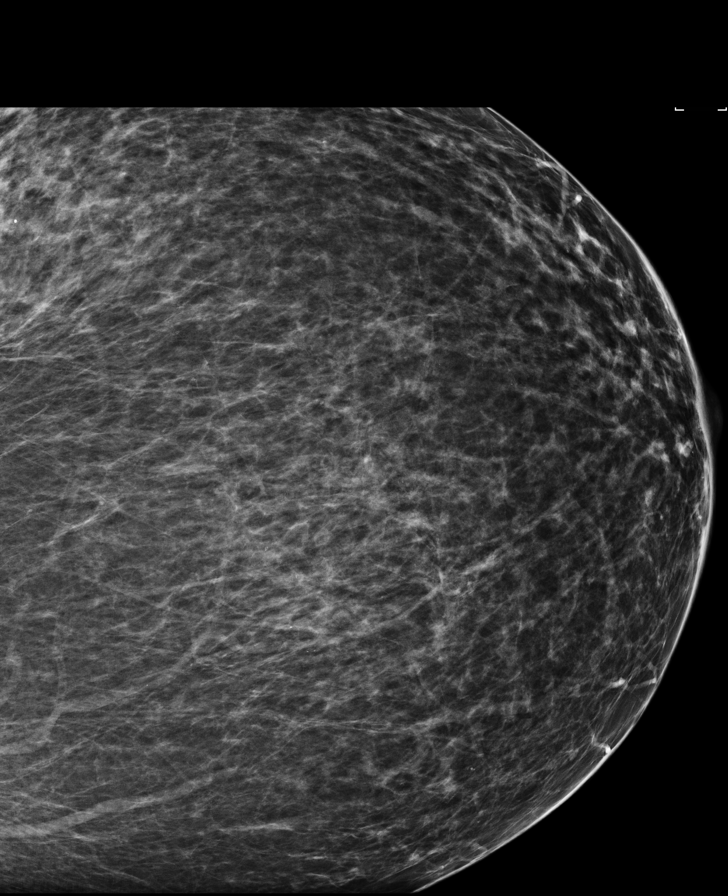

[8 of 8 positions shown; findings below may reference images not displayed]

ACR Breast Density Category b: There are scattered areas of
fibroglandular density.
FINDINGS: There are no findings suspicious for malignancy. Images were
processed with CAD.
IMPRESSION: No mammographic evidence of malignancy. A result letter of this
screening mammogram will be mailed directly to the patient.

RECOMMENDATION:
Screening mammogram in one year. (Code:[US])

BI-RADS CATEGORY  1: Negative.

## 2019-04-16 NOTE — Telephone Encounter (Signed)
Called and spoke with pt. Pt said at this time she wants to hold off on getting cpap. I stated to pt due to this, she does not need to schedule a f/u at this time. Pt verbalized understanding.nothing further needed.

## 2019-04-21 ENCOUNTER — Ambulatory Visit: Payer: 59 | Admitting: Pulmonary Disease

## 2019-04-25 HISTORY — PX: REDUCTION MAMMAPLASTY: SUR839

## 2019-04-28 ENCOUNTER — Encounter: Payer: Self-pay | Admitting: Family Medicine

## 2019-05-07 ENCOUNTER — Encounter: Payer: Self-pay | Admitting: Family Medicine

## 2019-05-14 ENCOUNTER — Other Ambulatory Visit: Payer: Self-pay

## 2019-05-14 ENCOUNTER — Ambulatory Visit (INDEPENDENT_AMBULATORY_CARE_PROVIDER_SITE_OTHER): Payer: 59

## 2019-05-14 DIAGNOSIS — J4550 Severe persistent asthma, uncomplicated: Secondary | ICD-10-CM | POA: Diagnosis not present

## 2019-05-14 MED ORDER — BENRALIZUMAB 30 MG/ML ~~LOC~~ SOSY
30.0000 mg | PREFILLED_SYRINGE | Freq: Once | SUBCUTANEOUS | Status: AC
  Administered 2019-05-14: 30 mg via SUBCUTANEOUS

## 2019-05-22 ENCOUNTER — Telehealth: Payer: Self-pay

## 2019-05-22 NOTE — Telephone Encounter (Signed)
Call to patient, left message on patient's voicemail to call clinic back.

## 2019-05-22 NOTE — Telephone Encounter (Signed)
Patient went to the pharmacy and her Symbicort is now $300. She stated the pharmacy didn't inform her of any other inhalers that would be covered.   Please Advise.  Thanks

## 2019-05-22 NOTE — Telephone Encounter (Signed)
Pt returned call.  Pt states that she has high deductible plan, and has not met her deductible yet.  Explained to patient that she may have to pay out of pocket until she reaches her deductible.  Writer also gave her a list of combination medications that she could call and see if her insurance would pay more for that she could pay less money.   Breo Ellipta Advair HFA/Disk[employer] Duo  Pt states that she will call her insurance and check. Will call us back and let us know.  Call ended.

## 2019-05-26 ENCOUNTER — Encounter: Payer: Self-pay | Admitting: Family Medicine

## 2019-05-26 NOTE — Telephone Encounter (Signed)
I called the pt and she stated she has noticed red blood only with bowel movements for the past 2 weeks and her stomach hurts.  Patient denies any vomiting, diarrhea or exposure to anyone with COVID or other symptoms related to [place].  I asked Dr Volanda Napoleon to review the symptoms and she advised the pt should be seen in the office for evaluation.  Appt was scheduled with Dr Elease Hashimoto for tomorrow to arrive at 10:15am as PCP is out of the office.

## 2019-05-27 ENCOUNTER — Ambulatory Visit: Payer: 59 | Attending: Plastic Surgery | Admitting: Physical Therapy

## 2019-05-27 ENCOUNTER — Encounter: Payer: Self-pay | Admitting: Family Medicine

## 2019-05-27 ENCOUNTER — Ambulatory Visit (INDEPENDENT_AMBULATORY_CARE_PROVIDER_SITE_OTHER): Payer: 59 | Admitting: Family Medicine

## 2019-05-27 ENCOUNTER — Encounter: Payer: Self-pay | Admitting: Physical Therapy

## 2019-05-27 ENCOUNTER — Other Ambulatory Visit: Payer: Self-pay

## 2019-05-27 VITALS — BP 118/76 | HR 100 | Temp 98.1°F | Ht 65.5 in | Wt 275.0 lb

## 2019-05-27 DIAGNOSIS — R293 Abnormal posture: Secondary | ICD-10-CM | POA: Diagnosis present

## 2019-05-27 DIAGNOSIS — G8929 Other chronic pain: Secondary | ICD-10-CM | POA: Diagnosis present

## 2019-05-27 DIAGNOSIS — R1084 Generalized abdominal pain: Secondary | ICD-10-CM

## 2019-05-27 DIAGNOSIS — M542 Cervicalgia: Secondary | ICD-10-CM | POA: Diagnosis present

## 2019-05-27 DIAGNOSIS — K602 Anal fissure, unspecified: Secondary | ICD-10-CM | POA: Diagnosis not present

## 2019-05-27 DIAGNOSIS — K921 Melena: Secondary | ICD-10-CM

## 2019-05-27 DIAGNOSIS — M545 Low back pain, unspecified: Secondary | ICD-10-CM

## 2019-05-27 LAB — CBC WITH DIFFERENTIAL/PLATELET
Basophils Absolute: 0 10*3/uL (ref 0.0–0.1)
Basophils Relative: 0.2 % (ref 0.0–3.0)
Eosinophils Absolute: 0 10*3/uL (ref 0.0–0.7)
Eosinophils Relative: 0 % (ref 0.0–5.0)
HCT: 40.4 % (ref 36.0–46.0)
Hemoglobin: 13.4 g/dL (ref 12.0–15.0)
Lymphocytes Relative: 17.4 % (ref 12.0–46.0)
Lymphs Abs: 1.7 10*3/uL (ref 0.7–4.0)
MCHC: 33.2 g/dL (ref 30.0–36.0)
MCV: 88.6 fl (ref 78.0–100.0)
Monocytes Absolute: 0.6 10*3/uL (ref 0.1–1.0)
Monocytes Relative: 6.3 % (ref 3.0–12.0)
Neutro Abs: 7.5 10*3/uL (ref 1.4–7.7)
Neutrophils Relative %: 76.1 % (ref 43.0–77.0)
Platelets: 318 10*3/uL (ref 150.0–400.0)
RBC: 4.56 Mil/uL (ref 3.87–5.11)
RDW: 15.4 % (ref 11.5–15.5)
WBC: 9.9 10*3/uL (ref 4.0–10.5)

## 2019-05-27 NOTE — Patient Instructions (Signed)
Access Code: [email or web address]  URL: [email or web address]  Date: 05/27/2019  Prepared by: Raeford Razor   Exercises  Doorway Pec Stretch at 90 Degrees Abduction - 3 reps - 1 sets - 30 hold - 2x daily - 7x weekly  Standing Shoulder Horizontal Abduction with Resistance - 10 reps - 2 sets - 5 hold - 2x daily - 7x weekly  Standing Row with Anchored Resistance - 10 reps - 2 sets - 5 hold - 2x daily - 7x weekly  Standing Elbow Extension with Anchored Resistance - 10 reps - 2 sets - 5 hold - 2x daily - 7x weekly

## 2019-05-27 NOTE — Progress Notes (Signed)
Subjective:     Patient ID: Alyssa Clay, female   DOB: 10-07-1965, 54 y.o.   MRN: [id number]  HPI   Alyssa Clay seen with 68-month history of intermittent bright red blood per rectum with wiping.  She denies any perianal pain.  Only occasional straining.  She came in to have the anal bleeding further evaluated but also relates starting this past Thursday she has had some relatively constant diffuse bilateral cramp-like abdominal pains.  She had some intermittent nausea without vomiting.  No clear triggering factors.  Recent history is that she had appendectomy back in August and had some pains afterwards, etiology of which was never fully identified.  She denies any recent dysuria.  No fever.  She did have loose stool last Thursday and then again Sunday but none since then.  Her blood has been with wiping and not in the toilet bowl or mixed with the stool.  She had CT scans both in August and then subsequently in September after her surgery which showed no acute abnormalities.  She has never had screening colonoscopy.  She is scheduled for breast reduction surgery March 11 and started a couple weeks ago with strict low-carb diet.  No known food allergies.  No aspirin use.  Past Medical History:  Diagnosis Date  . Asthma   . Depression   . Hypertension   . Migraine    Past Surgical History:  Procedure Laterality Date  . CESAREAN SECTION  1994  . DILATION AND CURETTAGE OF UTERUS     multiple due to miscarriages  . KNEE ARTHROSCOPY Left 2013  . LAPAROSCOPIC APPENDECTOMY N/A 12/08/2018   Procedure: APPENDECTOMY LAPAROSCOPIC;  Surgeon: Donnie Mesa, MD;  Location: [facility];  Service: General;  Laterality: N/A;  . TENNIS ELBOW RELEASE/NIRSCHEL PROCEDURE  2009  . WISDOM TOOTH EXTRACTION      reports that she has never smoked. She has never used smokeless tobacco. She reports current alcohol use. She reports that she does not use drugs. family history includes Cancer (age of onset: 38) in  her mother; Depression in her mother; Healthy in her daughter; Heart attack (age of onset: 21) in her father; High Cholesterol in her brother; High blood pressure in her brother; Hypertension in her brother and father. No Known Allergies  Wt Readings from Last 3 Encounters:  05/27/19 275 lb (124.7 kg)  04/15/19 282 lb 9.6 oz (128.2 kg)  03/04/19 273 lb (123.8 kg)     Review of Systems  Constitutional: Negative for appetite change, chills, fever and unexpected weight change.  Respiratory: Negative for cough and shortness of breath.   Cardiovascular: Negative for chest pain.  Gastrointestinal: Positive for abdominal pain and anal bleeding. Negative for abdominal distention, diarrhea and vomiting.  Genitourinary: Negative for dysuria.  Neurological: Negative for dizziness and weakness.       Objective:   Physical Exam Vitals reviewed.  Constitutional:      Appearance: She is well-developed.  Cardiovascular:     Rate and Rhythm: Normal rate and regular rhythm.  Pulmonary:     Effort: Pulmonary effort is normal.     Breath sounds: Normal breath sounds.  Abdominal:     General: Bowel sounds are normal. There is no distension.     Comments: Normal bowel sounds.  She has some mild diffuse tenderness upper and lower quadrants bilaterally but no guarding or rebound.  No mass.  Neurological:     Mental Status: She is alert.  Assessment:     #1   67-month history of intermittent rectal bleeding mostly with wiping.  She does have a very superficial anal fissure around the 12 o'clock position but no active bleeding.  No external hemorrhoids.  #2   5-day history of relatively constant diffuse abdominal cramp-like pains with nonacute exam.  Recent appendectomy as above.  Clinically, no evidence for acute bowel obstruction.    Plan:     -Anal fissure likely source of rectal bleeding as above though we explained that we cannot be definitive this is the source..  She has not had any  red flags such as appetite or weight change. We discussed possible anoscopy but explained this could exacerbate her anal fissure -She needs screening colonoscopy anyway and we recommend setting up GI referral for that -Check CBC with differential -Discussed red flags to watch out for with abdominal pain such as fever, vomiting, localizing pain, etc. -Continue measures to reduce straining such as plenty of fluids, fiber, stool softener -Recommend sitz bath daily for her fissure  Eulas Post MD [facility]

## 2019-05-27 NOTE — Patient Instructions (Signed)
Anal Fissure, Adult  An anal fissure is a small tear or crack in the tissue of the anus. Bleeding from a fissure usually stops on its own within a few minutes. However, bleeding will often occur again with each bowel movement until the fissure heals. What are the causes? This condition is usually caused by passing a large or hard stool (feces). Other causes include:  Constipation.  Frequent diarrhea.  Inflammatory bowel disease (Crohn's disease or ulcerative colitis).  Childbirth.  Infections.  Anal sex. What are the signs or symptoms? Symptoms of this condition include:  Bleeding from the rectum.  Small amounts of blood seen on your stool, on the toilet paper, or in the toilet after a bowel movement. The blood coats the outside of the stool and is not mixed with the stool.  Painful bowel movements.  Itching or irritation around the anus. How is this diagnosed? A health care provider may diagnose this condition by closely examining the anal area. An anal fissure can usually be seen with careful inspection. In some cases, a rectal exam may be performed, or a short tube (anoscope) may be used to examine the anal canal. How is this treated? Initial treatment for this condition may include:  Taking steps to avoid constipation. This may include making changes to your diet, such as increasing your intake of fiber or fluid.  Taking fiber supplements. These supplements can soften your stool to help make bowel movements easier. Your health care provider may also prescribe a stool softener if your stool is hard.  Taking sitz baths. This may help to heal the tear.  Using medicated creams or ointments. These may be prescribed to lessen discomfort. Treatments that are sometimes used if initial treatments do not work well or if the condition is more severe may include:  Botulinum injection.  Surgery to repair the fissure. Follow these instructions at home: Eating and  drinking   Avoid foods that may cause constipation, such as bananas, milk, and other dairy products.  Eat all fruits, except bananas.  Drink enough fluid to keep your urine pale yellow.  Eat foods that are high in fiber, such as beans, whole grains, and fresh fruits and vegetables. General instructions   Take over-the-counter and prescription medicines only as told by your health care provider.  Use creams or ointments only as told by your health care provider.  Keep the anal area clean and dry.  Take sitz baths as told by your health care provider. Do not use soap in the sitz baths.  Keep all follow-up visits as told by your health care provider. This is important. Contact a health care provider if you have:  More bleeding.  A fever.  Diarrhea that is mixed with blood.  Pain that continues.  Ongoing problems that are getting worse rather than better. Summary  An anal fissure is a small tear or crack in the tissue of the anus. This condition is usually caused by passing a large or hard stool (feces). Other causes include constipation and frequent diarrhea.  Initial treatment for this condition may include taking steps to avoid constipation, such as increasing your intake of fiber or fluid.  Follow instructions for care as told by your health care provider.  Contact your health care provider if you have more bleeding or your problem is getting worse rather than better.  Keep all follow-up visits as told by your health care provider. This is important. This information is not intended to replace advice given   to you by your health care provider. Make sure you discuss any questions you have with your health care provider. Document Revised: 09/20/2017 Document Reviewed: 09/20/2017 Elsevier Patient Education  2020 [employer]  

## 2019-05-28 ENCOUNTER — Telehealth: Payer: Self-pay | Admitting: Family Medicine

## 2019-05-28 MED ORDER — VALACYCLOVIR HCL 1 G PO TABS: 1000.0000 mg | ORAL_TABLET | Freq: Two times a day (BID) | ORAL | 3 refills | Status: DC

## 2019-05-28 NOTE — Therapy (Addendum)
[place], [place], [address] Phone: [phone]   Fax:  [phone]  Physical Therapy Evaluation/Discharge   Patient Details  Name: Alyssa Clay MRN: [id number] Date of Birth: 05-Oct-1965 Referring Provider (PT): Audelia Hives, MD    Encounter Date: 05/27/2019  PT End of Session - 05/27/19 1524    Visit Number  1    Number of Visits  4    Date for PT Re-Evaluation  07/01/19    Authorization Type  [employer]    PT Start Time  1445    PT Stop Time  1530    PT Time Calculation (min)  45 min    Activity Tolerance  Patient tolerated treatment well    Behavior During Therapy  [facility] for tasks assessed/performed       Past Medical History:  Diagnosis Date  . Asthma   . Depression   . Hypertension   . Migraine     Past Surgical History:  Procedure Laterality Date  . CESAREAN SECTION  1994  . DILATION AND CURETTAGE OF UTERUS     multiple due to miscarriages  . KNEE ARTHROSCOPY Left 2013  . LAPAROSCOPIC APPENDECTOMY N/A 12/08/2018   Procedure: APPENDECTOMY LAPAROSCOPIC;  Surgeon: Donnie Mesa, MD;  Location: [facility];  Service: General;  Laterality: N/A;  . TENNIS ELBOW RELEASE/NIRSCHEL PROCEDURE  2009  . WISDOM TOOTH EXTRACTION      There were no vitals filed for this visit.   Subjective Assessment - 05/27/19 1455    Subjective  Pt presents prior to her breast reduction surgery to improve her pain in her neck and lower back.  She has been making efforts to lose weight and is using the elliptical at home (10 min). She does have pain during and after her workouts.    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  not limited    How long can you stand comfortably?  not long, varies    How long can you walk comfortably?  could walk 30 min but her back pain increases     Diagnostic tests  none    Patient Stated Goals  Patient wants to have less pain in back , neck.    Currently in Pain?  Yes    Pain Score  2     Pain Location  Neck    Pain Orientation  Right;Left;Posterior    Pain Descriptors / Indicators  Tightness    Pain Type  Chronic pain    Pain Onset  More than a month ago    Pain Frequency  Intermittent    Aggravating Factors   standing, lifting, exercise    Pain Relieving Factors  rest, lying down    Effect of Pain on Daily Activities  harder to lose wgt.    Multiple Pain Sites  Yes    Pain Score  4    Pain Location  Back    Pain Orientation  Mid;Lower    Pain Descriptors / Indicators  Tightness;Aching    Pain Type  Chronic pain    Pain Onset  More than a month ago    Pain Frequency  Intermittent    Aggravating Factors   standing    Pain Relieving Factors  sitting, rest, reclining    Effect of Pain on Daily Activities  difficult to lose wgt.  [facility] PT Assessment - 05/28/19 0001      Assessment   Medical Diagnosis  back and neck pain     Referring Provider (PT)  Audelia Hives, MD     Onset Date/Surgical Date  --   chronic 26+ yrs   Next MD Visit  Pre- op in a couple weeks     Prior Therapy  for knee, neck long ago       Precautions   Precautions  None      Balance Screen   Has the patient fallen in the past 6 months  Yes   fell in hospital  x 2   How many times?  2    Has the patient had a decrease in activity level because of a fear of falling?   No    Is the patient reluctant to leave their home because of a fear of falling?   No      Prior Function   Level of Independence  Independent with basic ADLs    Vocation  Unemployed    Leisure  hang with dog, 62 yrs old, being more active , friends       Cognition   Overall Cognitive Status  Within Functional Limits for tasks assessed      Observation/Other Assessments   Focus on Therapeutic Outcomes (FOTO)   57%      Posture/Postural Control    Posture/Postural Control  Postural limitations    Postural Limitations  Rounded Shoulders;Forward head;Increased thoracic kyphosis;Flexed trunk      AROM   Overall AROM Comments  neck WFL, lumbar min discomfort in flexion and extension , WFL otherwise       Strength   Overall Strength Comments  UE grossly 4+/5 oin GH joint, seated LE MMT WNL       Palpation   Palpation comment  min pain across lumbar spine       [facility] PT Assessment - 05/28/19 0001      Assessment   Medical Diagnosis  back and neck pain     Referring Provider (PT)  Audelia Hives, MD     Onset Date/Surgical Date  --   chronic 26+ yrs   Next MD Visit  Pre- op in a couple weeks     Prior Therapy  for knee, neck long ago       Precautions   Precautions  None      Balance Screen   Has the patient fallen in the past 6 months  Yes   fell in hospital  x 2   How many times?  2    Has the patient had a decrease in activity level because of a fear of falling?   No    Is the patient reluctant to leave their home because of a fear of falling?   No      Prior Function   Level of Independence  Independent with basic ADLs    Vocation  Unemployed    Leisure  hang with dog, 40 yrs old, being more active , friends       Cognition   Overall Cognitive Status  Within Functional Limits for tasks assessed      Observation/Other Assessments   Focus on Therapeutic Outcomes (FOTO)   57%      Posture/Postural Control   Posture/Postural Control  Postural limitations    Postural Limitations  Rounded Shoulders;Forward head;Increased thoracic kyphosis;Flexed trunk      AROM   Overall AROM Comments  neck WFL, lumbar min discomfort in flexion and extension , WFL otherwise       Strength   Overall Strength Comments  UE grossly 4+/5 oin GH joint, seated LE MMT WNL       Palpation   Palpation comment  min pain across lumbar spine                 Objective measurements completed on examination: See above findings.       [facility] PT Treatment/Exercise - 05/28/19 0001      Shoulder Exercises: Standing   Horizontal ABduction  Strengthening;Both;10 reps    Theraband Level (Shoulder Horizontal ABduction)  Level 2 (Red)    Extension  Strengthening;Both;15 reps    Theraband Level (Shoulder Extension)  Level 3 (Green)    Row  Strengthening;Both;15 reps    Theraband Level (Shoulder Row)  Level 3 (Green)      Shoulder Exercises: IT sales professional  3 reps;30 seconds             PT Education - 05/28/19 0735    Education Details  PT/POC, HEP and rationale for therapy    Person(s) Educated  Patient    Methods  Explanation;Demonstration    Comprehension  Verbalized understanding;Returned demonstration          PT Long Term Goals - 05/28/19 0736      PT LONG TERM GOAL #1   Title  Pt will be able to independently perform HEP for posture and neck, back without increasing pain    Time  5    Period  Weeks    Status  New    Target Date  07/01/19      PT LONG TERM GOAL #2   Title  Pt will be able to tolerate 20 min on elliptical at home with min increase in back pain    Time  5    Period  Weeks    Status  New    Target Date  07/01/19      PT LONG TERM GOAL #3   Title  Pt will be able to sit with corrected posture to reduce strain on neck    Time  5    Period  Weeks    Status  New    Target Date  07/01/19      PT LONG TERM GOAL #4   Title  Pt will report a reduction in neck, back pain to no more than 5/10 most of the time, ADLs    Time  5    Period  Weeks    Status  New    Target Date  07/01/19             Plan - 05/28/19 0945    Clinical Impression Statement  Patient presents for PT prior to surgery for breast reduction.  She has had chronic neck and back pain >25 years.  She has decent UE and LE strength.  She reports having to lose 4 lbs before her surgery.  She has made dietary changes and is using her elliptical 10 min every day.  She is deconditioned and lacks  postural awareness.  PT will assist in providing HEP for postural awareness, strength and to counteract strain of mammary hypertrophy.  Surgery scheduled for 07/03/19.    Personal Factors and Comorbidities  Comorbidity 1;Fitness    Comorbidities  obesity, diaphragm dysfunction, migraines    Examination-Activity Limitations  Stand;Bend;Lift;Transfers;Sleep;Squat;Carry;Locomotion Level    Examination-Participation Restrictions  Interpersonal Relationship;Cleaning;Community Activity;Shop;Meal Prep;Driving    Stability/Clinical Decision Making  Stable/Uncomplicated    Clinical Decision Making  Low    Rehab Potential  Excellent    PT Frequency  1x / week    PT Duration  4 weeks   5   PT Treatment/Interventions  ADLs/Self Care Home Management;Cryotherapy;Electrical Stimulation;Patient/family education;Balance training;Therapeutic exercise;Neuromuscular re-education;Functional mobility training;Therapeutic activities;Moist Heat    PT Next Visit Plan  HEP review, use wall for posture and cervical/scapular stab.    PT Home Exercise Plan  corner stretch, banded horizontal abd, row and extension    Consulted and Agree with Plan of Care  Patient       Patient will benefit from skilled therapeutic intervention in order to improve the following deficits and impairments:  Decreased activity tolerance, Decreased mobility, Obesity, Postural dysfunction, Impaired flexibility, Pain, Impaired UE functional use, Increased fascial restricitons, Decreased strength  Visit Diagnosis: Abnormal posture  Cervicalgia  Chronic low back pain without sciatica, unspecified back pain laterality     Problem List Patient Active Problem List   Diagnosis Date Noted  . Symptomatic mammary hypertrophy 04/15/2019  . Back pain 04/15/2019  . Neck pain 04/15/2019  . History of frequent upper respiratory infection 01/16/2019  . Screening for viral disease 01/16/2019  . Atelectasis 01/16/2019  . Severe persistent asthma  01/16/2019  . Shortness of breath 01/01/2019  . Acute appendicitis 12/07/2018  . Primary osteoarthritis of both knees 07/10/2018  . Hypertension 03/27/2018  . 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 Moderate persistent asthma 01/07/2018  . Chronic rhinitis 01/07/2018  . Gastroesophageal reflux disease 01/07/2018    Nikki Rusnak 05/28/2019, 10:04 AM  [facility] [address] [place], [place], [address] Phone: [phone]   Fax:  [phone]  Name: Alyssa Clay MRN: [id number] Date of Birth: 1965/06/29   Raeford Razor, PT 05/28/19 10:04 AM Phone: [phone] Fax: [phone]   PHYSICAL THERAPY DISCHARGE SUMMARY  Visits from Start of Care: 1  Current functional level related to goals / functional outcomes: Unknown    Remaining deficits: Unknown   Education / Equipment: HEP, posture Plan: Patient agrees to discharge.  Patient goals were not met. Patient is being discharged due to not returning since the last visit.  ?????    Raeford Razor, PT 07/14/19 8:27 AM Phone: [phone] Fax: [phone]

## 2019-05-28 NOTE — Telephone Encounter (Signed)
Pt wanted to inform Dr. Ethlyn Gallery of a medicine she has been taking, also from having her appendix taking out she needs a referral to a GI to have a scope down her throat. She is also requesting a refill on her Valtrex. Pt would like a call back at [phone]

## 2019-05-28 NOTE — Telephone Encounter (Signed)
I called the pt and informed her the referral was entered by Dr Elease Hashimoto yesterday and the gastroenterologist will determine what testing is needed.  Also advised the pt the refill for Valtrex was sent to [facility].

## 2019-05-28 NOTE — Telephone Encounter (Signed)
Ok for gi referral and for valtrex refill.

## 2019-05-29 ENCOUNTER — Telehealth: Payer: Self-pay | Admitting: Family Medicine

## 2019-05-29 NOTE — Telephone Encounter (Signed)
Pt is calling in stated that she has had a headache and stomach ache for over two weeks and now she has developed a fever of 99.5 and whole body is aching.  Pt was advised by Dr. Elease Hashimoto if she should develop any symptoms to call the office back pt declined to make an appointment with another provider. Pt was advised to go to [facility] if she should develop any other symptoms.

## 2019-05-30 ENCOUNTER — Other Ambulatory Visit: Payer: Self-pay | Admitting: Family Medicine

## 2019-05-30 ENCOUNTER — Encounter: Payer: Self-pay | Admitting: Family Medicine

## 2019-05-30 MED ORDER — CIPROFLOXACIN HCL 500 MG PO TABS: 500.0000 mg | ORAL_TABLET | Freq: Two times a day (BID) | ORAL | 0 refills | Status: DC

## 2019-06-03 ENCOUNTER — Other Ambulatory Visit: Payer: Self-pay | Admitting: Family Medicine

## 2019-06-04 ENCOUNTER — Other Ambulatory Visit: Payer: Self-pay

## 2019-06-04 ENCOUNTER — Encounter (HOSPITAL_BASED_OUTPATIENT_CLINIC_OR_DEPARTMENT_OTHER): Payer: Self-pay | Admitting: Plastic Surgery

## 2019-06-04 NOTE — Progress Notes (Signed)
Chart reviewed with Dr. 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 Dr. Conrad [place] states to bring pt in for anesthesia consult.

## 2019-06-05 ENCOUNTER — Encounter (HOSPITAL_BASED_OUTPATIENT_CLINIC_OR_DEPARTMENT_OTHER)
Admission: RE | Admit: 2019-06-05 | Discharge: 2019-06-05 | Disposition: A | Discharge status: Home / Self Care | Payer: 59 | Source: Ambulatory Visit | Attending: Plastic Surgery | Admitting: Plastic Surgery

## 2019-06-05 ENCOUNTER — Encounter: Payer: Self-pay | Admitting: Surgical

## 2019-06-05 ENCOUNTER — Other Ambulatory Visit: Payer: Self-pay

## 2019-06-05 ENCOUNTER — Ambulatory Visit (INDEPENDENT_AMBULATORY_CARE_PROVIDER_SITE_OTHER): Payer: 59 | Admitting: Surgical

## 2019-06-05 VITALS — BP 167/84 | HR 95 | Temp 97.5°F | Ht 65.5 in | Wt 276.0 lb

## 2019-06-05 DIAGNOSIS — M542 Cervicalgia: Secondary | ICD-10-CM

## 2019-06-05 DIAGNOSIS — Z01812 Encounter for preprocedural laboratory examination: Secondary | ICD-10-CM | POA: Diagnosis not present

## 2019-06-05 DIAGNOSIS — N62 Hypertrophy of breast: Secondary | ICD-10-CM

## 2019-06-05 DIAGNOSIS — M546 Pain in thoracic spine: Secondary | ICD-10-CM

## 2019-06-05 DIAGNOSIS — G8929 Other chronic pain: Secondary | ICD-10-CM

## 2019-06-05 MED ORDER — CEPHALEXIN 500 MG PO CAPS: 500.0000 mg | ORAL_CAPSULE | Freq: Four times a day (QID) | ORAL | 0 refills | Status: AC

## 2019-06-05 MED ORDER — HYDROCODONE-ACETAMINOPHEN 5-325 MG PO TABS: 1.0000 | ORAL_TABLET | Freq: Four times a day (QID) | ORAL | 0 refills | Status: AC | PRN

## 2019-06-05 NOTE — Progress Notes (Signed)
Patient ID: Alyssa Clay, female    DOB: 12-17-1965, 54 y.o.   MRN: [id number]  Chief Complaint  Patient presents with  . Pre-op Exam    for (B) breast reduction      ICD-10-CM   1. Symptomatic mammary hypertrophy  N62   2. Chronic bilateral thoracic back pain  M54.6    G89.29   3. Neck pain  M54.2     History of Present Illness: Alyssa Clay is a 54 y.o.  female  with a history of mammary hyperplasia for many years.  She presents for preoperative evaluation for upcoming procedure, bilateral breast reduction with liposuction, scheduled for 06/12/2019 with Dr. Audelia Hives.  StN on right is 45cm, StN on left is 45 cm. IMF distance > 20+ cm. 62ft 5 inches tall. Preop bra size is 44H cup. She would like to be closer to a C cup, smaller than expected. Estimated excess tissue to be removed at time of surgery is 1200 grams on LEFT and 1200 grams on RIGHT.  The patient has not had problems with anesthesia.  No history of DVT/PE.  No history of MI/CVA.  She reports her mother had a DVT in her late 31s.  No other family history of DVT/PE or bleeding or clotting disorders.  She has a hx of asthma, hemidiaphragm paralysis.  Patient had preop consult with anesthesia today, per EMR cleared by Dr. Sabra Heck for surgery on 2/18.  Patient being treated and managed by [facility] and critical care, recent evaluation on 03/04/19, recommended stopping o2 during day, patient did not desat with 6 minute walk test. She was to continue night oxygen due to nocturnal hypoxia.  She reports that overall she has been doing well.  She continues to use oxygen at night intermittently, she does not use it all the time.  She does not have any issues with shortness of breath at rest.  She reports some shortness of breath with exercise, but can walk around her neighborhood without symptoms.  She reports she has not needed her rescue inhaler for asthma since approximately August 2020 after surgery of her  appendix.   She has been feeling well lately. She does not smoke.  Past Medical History: Allergies: No Known Allergies  Current Medications:  Current Outpatient Medications:  .  albuterol (PROVENTIL HFA;VENTOLIN HFA) 108 (90 Base) MCG/ACT inhaler, Inhale 2 puffs into the lungs every 6 (six) hours as needed for wheezing or shortness of breath., Disp: 1 Inhaler, Rfl: 1 .  albuterol (PROVENTIL) (2.5 MG/3ML) 0.083% nebulizer solution, Take 2.5 mg by nebulization every 6 (six) hours as needed for wheezing or shortness of breath., Disp: , Rfl:  .  budesonide-formoterol (SYMBICORT) 160-4.5 MCG/ACT inhaler, Inhale 2 puffs into the lungs 2 (two) times daily., Disp: 1 Inhaler, Rfl: 5 .  calcium carbonate (OSCAL) 1500 (600 Ca) MG TABS tablet, Take 600 mg of elemental calcium by mouth 2 (two) times daily with a meal., Disp: , Rfl:  .  cyclobenzaprine (FLEXERIL) 10 MG tablet, Take 10 mg by mouth 3 (three) times daily as needed (migraines)., Disp: , Rfl:  .  DULoxetine (CYMBALTA) 60 MG capsule, TAKE 1 CAPSULE BY MOUTH EVERY DAY (Patient taking differently: Take 60 mg by mouth daily. ), Disp: 30 capsule, Rfl: 5 .  Erenumab-aooe (AIMOVIG) 140 MG/ML SOAJ, Inject into the skin., Disp: , Rfl:  .  FASENRA 30 MG/ML SOSY, SECOND SHIP: INJECT ONE SYRINGE UNDER THE SKIN AT WEEK 4 AND 8,  THEN EVERY 8 WEEKS THEREAFTER. (Patient taking differently: Inject 30 mg into the skin See admin instructions. Every 8 weeks), Disp: 1 Syringe, Rfl: 8 .  fluticasone (FLONASE) 50 MCG/ACT nasal spray, Place 1 spray into both nostrils 2 (two) times daily., Disp: 16 g, Rfl: 5 .  gabapentin (NEURONTIN) 300 MG capsule, Take 300-900 mg by mouth See admin instructions. Take 1 tablet every AM, three tablets at bedtime, Disp: , Rfl:  .  levonorgestrel (MIRENA) 20 MCG/24HR IUD, 1 each by Intrauterine route once., Disp: , Rfl:  .  losartan (COZAAR) 50 MG tablet, TAKE 1 TABLET BY MOUTH EVERY DAY, Disp: 30 tablet, Rfl: 2 .  montelukast (SINGULAIR)  10 MG tablet, Take 1 tablet (10 mg total) by mouth daily., Disp: 30 tablet, Rfl: 5 .  Multiple Vitamin (MULTIVITAMIN WITH MINERALS) TABS tablet, Take 1 tablet by mouth daily., Disp: , Rfl:  .  pantoprazole (PROTONIX) 40 MG tablet, Take 1 tablet (40 mg total) by mouth 2 (two) times daily before a meal., Disp: 60 tablet, Rfl: 5 .  Respiratory Therapy Supplies (FLUTTER) DEVI, Use 3 times daily as directed, Disp: 1 each, Rfl: 0 .  Rimegepant Sulfate (NURTEC) 75 MG TBDP, Take by mouth., Disp: , Rfl:  .  tiZANidine (ZANAFLEX) 4 MG tablet, Take 4 mg by mouth every 6 (six) hours as needed for muscle spasms., Disp: , Rfl:  .  traZODone (DESYREL) 100 MG tablet, TAKE 1-2 TABLETS (100-200 MG TOTAL) BY MOUTH AT BEDTIME AS NEEDED FOR SLEEP., Disp: 180 tablet, Rfl: 2 .  valACYclovir (VALTREX) 1000 MG tablet, Take 1 tablet (1,000 mg total) by mouth 2 (two) times daily., Disp: 18 tablet, Rfl: 3  Past Medical Problems: Past Medical History:  Diagnosis Date  . Asthma   . Depression   . Hypertension   . Migraine     Past Surgical History: Past Surgical History:  Procedure Laterality Date  . CESAREAN SECTION  1994  . DILATION AND CURETTAGE OF UTERUS     multiple due to miscarriages  . KNEE ARTHROSCOPY Left 2013  . LAPAROSCOPIC APPENDECTOMY N/A 12/08/2018   Procedure: APPENDECTOMY LAPAROSCOPIC;  Surgeon: Donnie Mesa, MD;  Location: [facility];  Service: General;  Laterality: N/A;  . TENNIS ELBOW RELEASE/NIRSCHEL PROCEDURE  2009  . WISDOM TOOTH EXTRACTION      Social History: Social History   Socioeconomic History  . Marital status: Married    Spouse name: Not on file  . Number of children: Not on file  . Years of education: Not on file  . Highest education level: Not on file  Occupational History  . Not on file  Tobacco Use  . Smoking status: Never Smoker  . Smokeless tobacco: Never Used  Substance and Sexual Activity  . Alcohol use: Yes    Comment: occ  . Drug use: Never  . Sexual activity:  Not on file  Other Topics Concern  . Not on file  Social History Narrative  . Not on file   Social Determinants of Health   Financial Resource Strain:   . Difficulty of Paying Living Expenses: Not on file  Food Insecurity:   . Worried About Charity fundraiser in the Last Year: Not on file  . Ran Out of Food in the Last Year: Not on file  Transportation Needs:   . Lack of Transportation (Medical): Not on file  . Lack of Transportation (Non-Medical): Not on file  Physical Activity:   . Days of Exercise per Week: Not  on file  . Minutes of Exercise per Session: Not on file  Stress:   . Feeling of Stress : Not on file  Social Connections:   . Frequency of Communication with Friends and Family: Not on file  . Frequency of Social Gatherings with Friends and Family: Not on file  . Attends Religious Services: Not on file  . Active Member of Clubs or Organizations: Not on file  . Attends Archivist Meetings: Not on file  . Marital Status: Not on file  Intimate Partner Violence:   . Fear of Current or Ex-Partner: Not on file  . Emotionally Abused: Not on file  . Physically Abused: Not on file  . Sexually Abused: Not on file    Family History: Family History  Problem Relation Age of Onset  . Cancer Mother 49       lymphoma  . Depression Mother   . Hypertension Father   . Heart attack Father 32  . Hypertension Brother   . High blood pressure Brother   . High Cholesterol Brother   . Healthy Daughter     Review of Systems: Review of Systems  Constitutional: Negative.   Respiratory: Negative for cough.   Cardiovascular: Negative for chest pain and leg swelling.  Gastrointestinal: Negative.   Musculoskeletal: Positive for back pain and neck pain.  Neurological: Negative.     Physical Exam: Vital Signs BP (!) 167/84 (BP Location: Left Arm, Patient Position: Sitting, Cuff Size: Large)   Pulse 95   Temp (!) 97.5 F (36.4 C) (Temporal)   Ht 5' 5.5" (1.664 m)    Wt 276 lb (125.2 kg)   SpO2 93%   BMI 45.23 kg/m  Physical Exam Exam conducted with a chaperone present.  Constitutional:      General: She is not in acute distress.    Appearance: Normal appearance. She is not ill-appearing.  HENT:     Head: Normocephalic and atraumatic.  Eyes:     Pupils: Pupils are equal, round Neck:     Musculoskeletal: Normal range of motion.  Cardiovascular:     Rate and Rhythm: Normal rate and regular rhythm.     Pulses: Normal pulses.     Heart sounds: Normal heart sounds. No murmur.  Pulmonary:     Effort: Pulmonary effort is normal. No respiratory distress.     Breath sounds: Normal breath sounds. No wheezing.  Breasts: Large ptotic breasts. Abdominal:     General: Abdomen is flat. There is no distension.     Palpations: Abdomen is soft.     Tenderness: There is no abdominal tenderness.  Musculoskeletal: Normal range of motion.  Skin:    General: Skin is warm and dry.     Findings: No erythema or rash.  Neurological:     General: No focal deficit present.     Mental Status: She is alert and oriented to person, place, and time. Mental status is at baseline.     Motor: No weakness.  Psychiatric:        Mood and Affect: Mood normal.        Behavior: Behavior normal.    Assessment/Plan: The patient is scheduled for bilateral breast reduction with liposuction with Dr. Marla Roe on 06/12/2019.    We thoroughly covered all the risks and benefits associated with bilateral breast reduction.  Patient and I thoroughly covered the consent book in regards to breast reduction and general surgical risks and she expressed understanding of all of her questions were  answered to her satisfaction.  The risk that can be encountered with breast reduction were discussed and include the following but not limited to these:  Breast asymmetry, fluid accumulation, firmness of the breast, inability to breast feed, loss of nipple or areola, skin loss, decrease or no nipple  sensation, fat necrosis of the breast tissue, bleeding, infection, healing delay.  There are risks of anesthesia, changes to skin sensation and injury to nerves or blood vessels.  The muscle can be temporarily or permanently injured.  You may have an allergic reaction to tape, suture, glue, blood products which can result in skin discoloration, swelling, pain, skin lesions, poor healing.  Any of these can lead to the need for revisonal surgery or stage procedures.  A reduction has potential to interfere with diagnostic procedures.  Nipple or breast piercing can increase risks of infection.  This procedure is best done when the breast is fully developed.  Changes in the breast will continue to occur over time.  Pregnancy can alter the outcomes of previous breast reduction surgery, weight gain and weigh loss can also effect the long term appearance.   Mammogram: 04/16/2019 - Negative screening mammogram. Smoking status: Non smoker. Caprini score: 9, high risk, ~6% risk w/out ppx. Recommend pharmacological ppx with LMWH or LDUH and mechanical ppx via SCDs and early ambulation. Patient is 54 yo, hx of 4 spontaneous abortions requiring D&C, mother with DVT hx, surgery > 45 minutes, hx of asthma and hemidiaphragm paralysis, BMI of 45.  Prescription sent to pharmacy.  Covid test scheduled for Monday. Pulmonary clearance sent to the [facility] pulmonary critical care via fax today, patient overall appears and reports to be doing well, but would like clearance by pulmonology prior to surgical intervention for patient safety.   Electronically signed by: Carola Rhine Lawana Hartzell, PA-C 06/05/2019 1:58 PM

## 2019-06-05 NOTE — Progress Notes (Signed)
Pt and her spouse talked with Dr Sabra Heck about her history and impending surgery on 2/18. Dr. Sabra Heck ok'd pt for surgery. Pt weighed today in pre-op. Weight is 124.2 kg. Height is 5.5 and 1/2, BMI calculated at @45  BMI. Ok to proceed with surgery.

## 2019-06-06 ENCOUNTER — Telehealth: Payer: Self-pay

## 2019-06-06 ENCOUNTER — Telehealth: Payer: Self-pay | Admitting: *Deleted

## 2019-06-06 ENCOUNTER — Other Ambulatory Visit: Payer: Self-pay | Admitting: Surgical

## 2019-06-06 MED ORDER — FLUCONAZOLE 150 MG PO TABS: 150.0000 mg | ORAL_TABLET | Freq: Once | ORAL | 0 refills | Status: AC

## 2019-06-06 NOTE — Progress Notes (Signed)
Diflucan sent to pharmacy for pt. She reports extensive hx of yeast infection when taking any antibiotic.

## 2019-06-06 NOTE — Telephone Encounter (Signed)
Called patient back and advised Diflucan was sent into her pharmacy

## 2019-06-06 NOTE — H&P (View-Only) (Signed)
Diflucan sent to pharmacy for pt. She reports extensive hx of yeast infection when taking any antibiotic.

## 2019-06-06 NOTE — Telephone Encounter (Addendum)
Request for Surgical Clearance was completed on (06/05/19) and faxed to Dr. Arty Baumgartner for Medical Clearance.  Confirmation received.     Waiting for clearance.//AB/CMA

## 2019-06-06 NOTE — Telephone Encounter (Signed)
Patient called requesting prescription for Diflucan to be sent to her pharmacy.

## 2019-06-09 ENCOUNTER — Other Ambulatory Visit (HOSPITAL_COMMUNITY)
Admission: RE | Admit: 2019-06-09 | Discharge: 2019-06-09 | Disposition: A | Discharge status: Home / Self Care | Payer: 59 | Source: Ambulatory Visit | Attending: Plastic Surgery | Admitting: Plastic Surgery

## 2019-06-09 DIAGNOSIS — Z01812 Encounter for preprocedural laboratory examination: Secondary | ICD-10-CM | POA: Diagnosis present

## 2019-06-09 DIAGNOSIS — Z20822 Contact with and (suspected) exposure to covid-19: Secondary | ICD-10-CM | POA: Insufficient documentation

## 2019-06-09 LAB — SARS CORONAVIRUS 2 (TAT 6-24 HRS): SARS Coronavirus 2: NEGATIVE

## 2019-06-10 ENCOUNTER — Ambulatory Visit: Payer: 59 | Admitting: Physical Therapy

## 2019-06-12 ENCOUNTER — Observation Stay (HOSPITAL_BASED_OUTPATIENT_CLINIC_OR_DEPARTMENT_OTHER)
Admission: RE | Admit: 2019-06-12 | Discharge: 2019-06-13 | Disposition: A | Discharge status: Home / Self Care | Payer: 59 | Attending: Plastic Surgery | Admitting: Plastic Surgery

## 2019-06-12 ENCOUNTER — Encounter (HOSPITAL_BASED_OUTPATIENT_CLINIC_OR_DEPARTMENT_OTHER): Payer: Self-pay | Admitting: Plastic Surgery

## 2019-06-12 ENCOUNTER — Other Ambulatory Visit: Payer: Self-pay

## 2019-06-12 ENCOUNTER — Ambulatory Visit (HOSPITAL_BASED_OUTPATIENT_CLINIC_OR_DEPARTMENT_OTHER): Payer: 59 | Admitting: Anesthesiology

## 2019-06-12 ENCOUNTER — Encounter (HOSPITAL_BASED_OUTPATIENT_CLINIC_OR_DEPARTMENT_OTHER)
Admission: RE | Disposition: A | Discharge status: Home / Self Care | Payer: Self-pay | Source: Home / Self Care | Attending: Plastic Surgery

## 2019-06-12 DIAGNOSIS — Z7951 Long term (current) use of inhaled steroids: Secondary | ICD-10-CM | POA: Diagnosis not present

## 2019-06-12 DIAGNOSIS — I10 Essential (primary) hypertension: Secondary | ICD-10-CM | POA: Insufficient documentation

## 2019-06-12 DIAGNOSIS — J45909 Unspecified asthma, uncomplicated: Secondary | ICD-10-CM | POA: Insufficient documentation

## 2019-06-12 DIAGNOSIS — Z6841 Body Mass Index (BMI) 40.0 and over, adult: Secondary | ICD-10-CM | POA: Insufficient documentation

## 2019-06-12 DIAGNOSIS — F329 Major depressive disorder, single episode, unspecified: Secondary | ICD-10-CM | POA: Diagnosis not present

## 2019-06-12 DIAGNOSIS — G43909 Migraine, unspecified, not intractable, without status migrainosus: Secondary | ICD-10-CM | POA: Diagnosis not present

## 2019-06-12 DIAGNOSIS — N62 Hypertrophy of breast: Secondary | ICD-10-CM

## 2019-06-12 DIAGNOSIS — G8929 Other chronic pain: Secondary | ICD-10-CM

## 2019-06-12 DIAGNOSIS — Z793 Long term (current) use of hormonal contraceptives: Secondary | ICD-10-CM | POA: Diagnosis not present

## 2019-06-12 DIAGNOSIS — Z9981 Dependence on supplemental oxygen: Secondary | ICD-10-CM | POA: Insufficient documentation

## 2019-06-12 DIAGNOSIS — Z79899 Other long term (current) drug therapy: Secondary | ICD-10-CM | POA: Diagnosis not present

## 2019-06-12 DIAGNOSIS — K219 Gastro-esophageal reflux disease without esophagitis: Secondary | ICD-10-CM | POA: Insufficient documentation

## 2019-06-12 DIAGNOSIS — M546 Pain in thoracic spine: Secondary | ICD-10-CM

## 2019-06-12 DIAGNOSIS — M542 Cervicalgia: Secondary | ICD-10-CM

## 2019-06-12 HISTORY — DX: Gastro-esophageal reflux disease without esophagitis: K21.9

## 2019-06-12 HISTORY — DX: Disorders of diaphragm: J98.6

## 2019-06-12 HISTORY — PX: BREAST REDUCTION SURGERY: SHX8

## 2019-06-12 SURGERY — BREAST REDUCTION WITH LIPOSUCTION
Anesthesia: General | Site: Breast | Laterality: Bilateral

## 2019-06-12 MED ORDER — DIPHENHYDRAMINE HCL 12.5 MG/5ML PO ELIX: 12.5000 mg | ORAL_SOLUTION | Freq: Four times a day (QID) | ORAL | Status: DC | PRN

## 2019-06-12 MED ORDER — MEPERIDINE HCL 25 MG/ML IJ SOLN: 6.2500 mg | INTRAMUSCULAR | Status: DC | PRN

## 2019-06-12 MED ORDER — HYDROMORPHONE HCL 1 MG/ML IJ SOLN
INTRAMUSCULAR | Status: AC
  Filled 2019-06-12: qty 0.5

## 2019-06-12 MED ORDER — KETAMINE HCL 10 MG/ML IJ SOLN
INTRAMUSCULAR | Status: DC | PRN
  Administered 2019-06-12: 30 mg via INTRAVENOUS

## 2019-06-12 MED ORDER — KCL IN DEXTROSE-NACL 20-5-0.45 MEQ/L-%-% IV SOLN
INTRAVENOUS | Status: DC
  Filled 2019-06-12: qty 1000

## 2019-06-12 MED ORDER — ALBUTEROL SULFATE HFA 108 (90 BASE) MCG/ACT IN AERS
INHALATION_SPRAY | RESPIRATORY_TRACT | Status: DC | PRN
  Administered 2019-06-12 (×2): 4 via RESPIRATORY_TRACT

## 2019-06-12 MED ORDER — IBUPROFEN 600 MG PO TABS
600.0000 mg | ORAL_TABLET | Freq: Three times a day (TID) | ORAL | Status: DC
  Administered 2019-06-12 – 2019-06-13 (×2): 600 mg via ORAL
  Filled 2019-06-12 (×2): qty 1

## 2019-06-12 MED ORDER — CEFAZOLIN SODIUM-DEXTROSE 1-4 GM/50ML-% IV SOLN
INTRAVENOUS | Status: AC
  Filled 2019-06-12: qty 50

## 2019-06-12 MED ORDER — EPHEDRINE 5 MG/ML INJ
INTRAVENOUS | Status: AC
  Filled 2019-06-12: qty 10

## 2019-06-12 MED ORDER — EPHEDRINE SULFATE-NACL 50-0.9 MG/10ML-% IV SOSY
PREFILLED_SYRINGE | INTRAVENOUS | Status: DC | PRN
  Administered 2019-06-12 (×2): 10 mg via INTRAVENOUS

## 2019-06-12 MED ORDER — SENNA 8.6 MG PO TABS
1.0000 | ORAL_TABLET | Freq: Two times a day (BID) | ORAL | Status: DC
  Administered 2019-06-12: 8.6 mg via ORAL
  Filled 2019-06-12: qty 1

## 2019-06-12 MED ORDER — HYDROMORPHONE HCL 1 MG/ML IJ SOLN: 1.0000 mg | INTRAMUSCULAR | Status: DC | PRN

## 2019-06-12 MED ORDER — PROPOFOL 500 MG/50ML IV EMUL
INTRAVENOUS | Status: DC | PRN
  Administered 2019-06-12: 25 ug/kg/min via INTRAVENOUS

## 2019-06-12 MED ORDER — KETAMINE HCL 100 MG/ML IJ SOLN
INTRAMUSCULAR | Status: AC
  Filled 2019-06-12: qty 1

## 2019-06-12 MED ORDER — ONDANSETRON HCL 4 MG/2ML IJ SOLN: 4.0000 mg | Freq: Four times a day (QID) | INTRAMUSCULAR | Status: DC | PRN

## 2019-06-12 MED ORDER — OXYCODONE HCL 5 MG/5ML PO SOLN
5.0000 mg | Freq: Once | ORAL | Status: AC | PRN
  Administered 2019-06-12: 5 mg via ORAL
  Filled 2019-06-12: qty 5

## 2019-06-12 MED ORDER — HYDROMORPHONE HCL 1 MG/ML IJ SOLN
0.2500 mg | INTRAMUSCULAR | Status: DC | PRN
  Administered 2019-06-12: 0.5 mg via INTRAVENOUS

## 2019-06-12 MED ORDER — LIDOCAINE 2% (20 MG/ML) 5 ML SYRINGE
INTRAMUSCULAR | Status: AC
  Filled 2019-06-12: qty 5

## 2019-06-12 MED ORDER — CEFAZOLIN SODIUM-DEXTROSE 2-4 GM/100ML-% IV SOLN
INTRAVENOUS | Status: AC
  Filled 2019-06-12: qty 100

## 2019-06-12 MED ORDER — MIDAZOLAM HCL 2 MG/2ML IJ SOLN
INTRAMUSCULAR | Status: AC
  Filled 2019-06-12: qty 2

## 2019-06-12 MED ORDER — ACETAMINOPHEN 10 MG/ML IV SOLN
INTRAVENOUS | Status: AC
  Filled 2019-06-12: qty 100

## 2019-06-12 MED ORDER — ACETAMINOPHEN 325 MG PO TABS
325.0000 mg | ORAL_TABLET | Freq: Four times a day (QID) | ORAL | Status: DC
  Administered 2019-06-13 (×2): 325 mg via ORAL
  Filled 2019-06-12 (×2): qty 1

## 2019-06-12 MED ORDER — OXYCODONE HCL 5 MG PO TABS: 5.0000 mg | ORAL_TABLET | ORAL | Status: DC | PRN

## 2019-06-12 MED ORDER — ONDANSETRON HCL 4 MG/2ML IJ SOLN
INTRAMUSCULAR | Status: AC
  Filled 2019-06-12: qty 2

## 2019-06-12 MED ORDER — FENTANYL CITRATE (PF) 100 MCG/2ML IJ SOLN
INTRAMUSCULAR | Status: AC
  Filled 2019-06-12: qty 2

## 2019-06-12 MED ORDER — POLYETHYLENE GLYCOL 3350 17 G PO PACK: 17.0000 g | PACK | Freq: Every day | ORAL | Status: DC | PRN

## 2019-06-12 MED ORDER — PROPOFOL 10 MG/ML IV BOLUS
INTRAVENOUS | Status: DC | PRN
  Administered 2019-06-12: 200 mg via INTRAVENOUS

## 2019-06-12 MED ORDER — OXYCODONE HCL 5 MG PO TABS: 5.0000 mg | ORAL_TABLET | Freq: Once | ORAL | Status: AC | PRN

## 2019-06-12 MED ORDER — PHENYLEPHRINE 40 MCG/ML (10ML) SYRINGE FOR IV PUSH (FOR BLOOD PRESSURE SUPPORT)
PREFILLED_SYRINGE | INTRAVENOUS | Status: AC
  Filled 2019-06-12: qty 10

## 2019-06-12 MED ORDER — PROMETHAZINE HCL 25 MG/ML IJ SOLN
INTRAMUSCULAR | Status: AC
  Filled 2019-06-12: qty 1

## 2019-06-12 MED ORDER — CHLORHEXIDINE GLUCONATE CLOTH 2 % EX PADS: 6.0000 | MEDICATED_PAD | Freq: Once | CUTANEOUS | Status: DC

## 2019-06-12 MED ORDER — MIDAZOLAM HCL 2 MG/2ML IJ SOLN
INTRAMUSCULAR | Status: DC | PRN
  Administered 2019-06-12: 2 mg via INTRAVENOUS

## 2019-06-12 MED ORDER — ONDANSETRON HCL 4 MG/2ML IJ SOLN
INTRAMUSCULAR | Status: DC | PRN
  Administered 2019-06-12: 4 mg via INTRAVENOUS

## 2019-06-12 MED ORDER — ACETAMINOPHEN 10 MG/ML IV SOLN
INTRAVENOUS | Status: DC | PRN
  Administered 2019-06-12: 1000 mg via INTRAVENOUS

## 2019-06-12 MED ORDER — DEXAMETHASONE SODIUM PHOSPHATE 10 MG/ML IJ SOLN
INTRAMUSCULAR | Status: DC | PRN
  Administered 2019-06-12: 5 mg via INTRAVENOUS

## 2019-06-12 MED ORDER — LACTATED RINGERS IV SOLN: INTRAVENOUS | Status: DC

## 2019-06-12 MED ORDER — FENTANYL CITRATE (PF) 100 MCG/2ML IJ SOLN
INTRAMUSCULAR | Status: DC | PRN
  Administered 2019-06-12 (×2): 50 ug via INTRAVENOUS
  Administered 2019-06-12 (×2): 25 ug via INTRAVENOUS
  Administered 2019-06-12: 50 ug via INTRAVENOUS

## 2019-06-12 MED ORDER — ALBUTEROL SULFATE HFA 108 (90 BASE) MCG/ACT IN AERS
INHALATION_SPRAY | RESPIRATORY_TRACT | Status: AC
  Filled 2019-06-12: qty 6.7

## 2019-06-12 MED ORDER — CEFAZOLIN SODIUM-DEXTROSE 2-4 GM/100ML-% IV SOLN
2.0000 g | INTRAVENOUS | Status: AC
  Administered 2019-06-12: 3 g via INTRAVENOUS

## 2019-06-12 MED ORDER — LIDOCAINE HCL (CARDIAC) PF 100 MG/5ML IV SOSY
PREFILLED_SYRINGE | INTRAVENOUS | Status: DC | PRN
  Administered 2019-06-12: 40 mg via INTRAVENOUS

## 2019-06-12 MED ORDER — CEFAZOLIN SODIUM-DEXTROSE 2-4 GM/100ML-% IV SOLN
2.0000 g | Freq: Three times a day (TID) | INTRAVENOUS | Status: DC
  Administered 2019-06-12 – 2019-06-13 (×2): 2 g via INTRAVENOUS
  Filled 2019-06-12 (×2): qty 100

## 2019-06-12 MED ORDER — PHENYLEPHRINE 40 MCG/ML (10ML) SYRINGE FOR IV PUSH (FOR BLOOD PRESSURE SUPPORT)
PREFILLED_SYRINGE | INTRAVENOUS | Status: DC | PRN
  Administered 2019-06-12: 100 ug via INTRAVENOUS
  Administered 2019-06-12: 80 ug via INTRAVENOUS
  Administered 2019-06-12: 40 ug via INTRAVENOUS

## 2019-06-12 MED ORDER — ONDANSETRON 4 MG PO TBDP: 4.0000 mg | ORAL_TABLET | Freq: Four times a day (QID) | ORAL | Status: DC | PRN

## 2019-06-12 MED ORDER — PROPOFOL 10 MG/ML IV BOLUS
INTRAVENOUS | Status: AC
  Filled 2019-06-12: qty 40

## 2019-06-12 MED ORDER — PROMETHAZINE HCL 25 MG/ML IJ SOLN
6.2500 mg | INTRAMUSCULAR | Status: DC | PRN
  Administered 2019-06-12: 6.25 mg via INTRAVENOUS

## 2019-06-12 MED ORDER — DIPHENHYDRAMINE HCL 50 MG/ML IJ SOLN: 12.5000 mg | Freq: Four times a day (QID) | INTRAMUSCULAR | Status: DC | PRN

## 2019-06-12 SURGICAL SUPPLY — 68 items
APPLIER CLIP 11 MED OPEN (CLIP) ×2
BAG DECANTER FOR FLEXI CONT (MISCELLANEOUS) ×2 IMPLANT
BINDER BREAST LRG (GAUZE/BANDAGES/DRESSINGS) IMPLANT
BINDER BREAST MEDIUM (GAUZE/BANDAGES/DRESSINGS) IMPLANT
BINDER BREAST XLRG (GAUZE/BANDAGES/DRESSINGS) IMPLANT
BINDER BREAST XXLRG (GAUZE/BANDAGES/DRESSINGS) IMPLANT
BIOPATCH RED 1 DISK 7.0 (GAUZE/BANDAGES/DRESSINGS) ×4 IMPLANT
BLADE HEX COATED 2.75 (ELECTRODE) ×2 IMPLANT
BLADE KNIFE PERSONA 10 (BLADE) ×4 IMPLANT
BLADE SURG 15 STRL LF DISP TIS (BLADE) ×1 IMPLANT
BLADE SURG 15 STRL SS (BLADE) ×1
BNDG GAUZE ELAST 4 BULKY (GAUZE/BANDAGES/DRESSINGS) IMPLANT
CANISTER SUCT 1200ML W/VALVE (MISCELLANEOUS) ×2 IMPLANT
CLIP APPLIE 11 MED OPEN (CLIP) ×1 IMPLANT
CLIP VESOCCLUDE SM WIDE 6/CT (CLIP) ×2 IMPLANT
COTTONBALL LRG STERILE PKG (GAUZE/BANDAGES/DRESSINGS) ×8 IMPLANT
COVER BACK TABLE 60X90IN (DRAPES) ×2 IMPLANT
COVER MAYO STAND STRL (DRAPES) ×2 IMPLANT
COVER WAND RF STERILE (DRAPES) IMPLANT
DECANTER SPIKE VIAL GLASS SM (MISCELLANEOUS) IMPLANT
DERMABOND ADVANCED (GAUZE/BANDAGES/DRESSINGS) ×4
DERMABOND ADVANCED .7 DNX12 (GAUZE/BANDAGES/DRESSINGS) ×4 IMPLANT
DRAIN CHANNEL 19F RND (DRAIN) ×4 IMPLANT
DRAPE LAPAROSCOPIC ABDOMINAL (DRAPES) ×2 IMPLANT
DRSG PAD ABDOMINAL 8X10 ST (GAUZE/BANDAGES/DRESSINGS) ×8 IMPLANT
ELECT BLADE 4.0 EZ CLEAN MEGAD (MISCELLANEOUS)
ELECT REM PT RETURN 9FT ADLT (ELECTROSURGICAL) ×2
ELECTRODE BLDE 4.0 EZ CLN MEGD (MISCELLANEOUS) IMPLANT
ELECTRODE REM PT RTRN 9FT ADLT (ELECTROSURGICAL) ×1 IMPLANT
EVACUATOR SILICONE 100CC (DRAIN) ×4 IMPLANT
GAUZE XEROFORM 5X9 LF (GAUZE/BANDAGES/DRESSINGS) ×4 IMPLANT
GLOVE BIO SURGEON STRL SZ 6.5 (GLOVE) ×12 IMPLANT
GOWN STRL REUS W/ TWL LRG LVL3 (GOWN DISPOSABLE) ×4 IMPLANT
GOWN STRL REUS W/TWL LRG LVL3 (GOWN DISPOSABLE) ×4
NDL SAFETY ECLIPSE 18X1.5 (NEEDLE) IMPLANT
NEEDLE HYPO 18GX1.5 SHARP (NEEDLE)
NEEDLE HYPO 25X1 1.5 SAFETY (NEEDLE) ×2 IMPLANT
NS IRRIG 1000ML POUR BTL (IV SOLUTION) IMPLANT
PACK BASIN DAY SURGERY FS (CUSTOM PROCEDURE TRAY) ×2 IMPLANT
PAD ALCOHOL SWAB (MISCELLANEOUS) IMPLANT
PENCIL SMOKE EVACUATOR (MISCELLANEOUS) ×2 IMPLANT
SLEEVE SCD COMPRESS KNEE MED (MISCELLANEOUS) ×2 IMPLANT
SPONGE LAP 18X18 RF (DISPOSABLE) ×8 IMPLANT
STRIP SUTURE WOUND CLOSURE 1/2 (MISCELLANEOUS) ×4 IMPLANT
SUT MNCRL AB 4-0 PS2 18 (SUTURE) ×16 IMPLANT
SUT MON AB 3-0 SH 27 (SUTURE) ×5
SUT MON AB 3-0 SH27 (SUTURE) ×5 IMPLANT
SUT MON AB 5-0 PS2 18 (SUTURE) ×12 IMPLANT
SUT PDS 3-0 CT2 (SUTURE)
SUT PDS AB 2-0 CT2 27 (SUTURE) IMPLANT
SUT PDS II 3-0 CT2 27 ABS (SUTURE) IMPLANT
SUT SILK 3 0 PS 1 (SUTURE) ×4 IMPLANT
SUT VIC AB 3-0 SH 27 (SUTURE)
SUT VIC AB 3-0 SH 27X BRD (SUTURE) IMPLANT
SUT VIC AB 4-0 SH 18 (SUTURE) ×2 IMPLANT
SUT VICRYL 4-0 PS2 18IN ABS (SUTURE) IMPLANT
SYR 3ML 23GX1 SAFETY (SYRINGE) ×2 IMPLANT
SYR 50ML LL SCALE MARK (SYRINGE) IMPLANT
SYR BULB IRRIGATION 50ML (SYRINGE) ×2 IMPLANT
SYR CONTROL 10ML LL (SYRINGE) ×2 IMPLANT
TAPE MEASURE VINYL STERILE (MISCELLANEOUS) IMPLANT
TOWEL GREEN STERILE FF (TOWEL DISPOSABLE) ×4 IMPLANT
TRAY DSU PREP LF (CUSTOM PROCEDURE TRAY) ×2 IMPLANT
TUBE CONNECTING 20X1/4 (TUBING) ×2 IMPLANT
TUBING INFILTRATION IT-10001 (TUBING) IMPLANT
TUBING SET GRADUATE ASPIR 12FT (MISCELLANEOUS) IMPLANT
UNDERPAD 30X36 HEAVY ABSORB (UNDERPADS AND DIAPERS) ×4 IMPLANT
YANKAUER SUCT BULB TIP NO VENT (SUCTIONS) ×2 IMPLANT

## 2019-06-12 NOTE — Anesthesia Preprocedure Evaluation (Signed)
Anesthesia Evaluation  Patient identified by MRN, date of birth, ID band Patient awake    Reviewed: Allergy & Precautions, NPO status , Patient's Chart, lab work & pertinent test results  Airway Mallampati: I  TM Distance: >3 FB Neck ROM: Full    Dental no notable dental hx.    Pulmonary neg pulmonary ROS, asthma ,    Pulmonary exam normal breath sounds clear to auscultation       Cardiovascular hypertension, Pt. on medications negative cardio ROS Normal cardiovascular exam Rhythm:Regular Rate:Normal     Neuro/Psych  Headaches, Depression negative psych ROS   GI/Hepatic negative GI ROS, Neg liver ROS, GERD  Medicated and Controlled,  Endo/Other  Morbid obesity  Renal/GU negative Renal ROS  negative genitourinary   Musculoskeletal negative musculoskeletal ROS (+)   Abdominal   Peds negative pediatric ROS (+)  Hematology negative hematology ROS (+)   Anesthesia Other Findings   Reproductive/Obstetrics negative OB ROS                             Anesthesia Physical  Anesthesia Plan  ASA: III  Anesthesia Plan: General   Post-op Pain Management:    Induction: Intravenous  PONV Risk Score and Plan: 3 and Ondansetron, Dexamethasone, Midazolam and Treatment may vary due to age or medical condition  Airway Management Planned: LMA  Additional Equipment:   Intra-op Plan:   Post-operative Plan: Extubation in OR  Informed Consent: I have reviewed the patients History and Physical, chart, labs and discussed the procedure including the risks, benefits and alternatives for the proposed anesthesia with the patient or authorized representative who has indicated his/her understanding and acceptance.       Plan Discussed with: CRNA and Surgeon  Anesthesia Plan Comments:         Anesthesia Quick Evaluation

## 2019-06-12 NOTE — Anesthesia Postprocedure Evaluation (Signed)
Anesthesia Post Note  Patient: Alyssa Clay  Procedure(s) Performed: BREAST REDUCTION WITH LIPOSUCTION (Bilateral Breast)     Patient location during evaluation: PACU Anesthesia Type: General Level of consciousness: sedated and patient cooperative Pain management: pain level controlled Vital Signs Assessment: post-procedure vital signs reviewed and stable Respiratory status: spontaneous breathing Cardiovascular status: stable Anesthetic complications: no    Last Vitals:  Vitals:   06/12/19 1815 06/12/19 1830  BP: (!) 150/71 (!) 141/69  Pulse: (!) 122 (!) 118  Resp: 17 (!) 24  Temp:    SpO2: 95% 94%    Last Pain:  Vitals:   06/12/19 1815  TempSrc:   PainSc: [place]

## 2019-06-12 NOTE — Transfer of Care (Signed)
Immediate Anesthesia Transfer of Care Note  Patient: Alyssa Clay  Procedure(s) Performed: BREAST REDUCTION WITH LIPOSUCTION (Bilateral Breast)  Patient Location: PACU  Anesthesia Type:General  Level of Consciousness: drowsy and patient cooperative  Airway & Oxygen Therapy: Patient Spontanous Breathing and Patient connected to face mask oxygen  Post-op Assessment: Report given to RN and Post -op Vital signs reviewed and stable  Post vital signs: Reviewed and stable  Last Vitals:  Vitals Value Taken Time  BP 156/75 06/12/19 1742  Temp    Pulse 124 06/12/19 1745  Resp 32 06/12/19 1745  SpO2 94 % 06/12/19 1745  Vitals shown include unvalidated device data.  Last Pain:  Vitals:   06/12/19 1215  TempSrc: Tympanic  PainSc: 0-No pain         Complications: No apparent anesthesia complications

## 2019-06-12 NOTE — Anesthesia Procedure Notes (Signed)
Procedure Name: LMA Insertion Date/Time: 06/12/2019 1:28 PM Performed by: Raenette Rover, CRNA Pre-anesthesia Checklist: Patient identified, Emergency Drugs available, Suction available and Patient being monitored Patient Re-evaluated:Patient Re-evaluated prior to induction Oxygen Delivery Method: Circle system utilized Preoxygenation: Pre-oxygenation with 100% oxygen Induction Type: IV induction LMA: LMA with gastric port inserted LMA Size: 4.0 Number of attempts: 1 Placement Confirmation: positive ETCO2 and breath sounds checked- equal and bilateral Tube secured with: Tape Dental Injury: Teeth and Oropharynx as per pre-operative assessment

## 2019-06-12 NOTE — Op Note (Signed)
Breast Reduction Op note:    DATE OF PROCEDURE: 06/12/2019  LOCATION: [place]  SURGEON: Lyndee Leo Sanger Marysol Wellnitz, DO  ASSISTANT: Phoebe Sharps, PA  PREOPERATIVE DIAGNOSIS 1. Macromastia 2. Neck Pain 3. Back Pain  POSTOPERATIVE DIAGNOSIS 1. Macromastia 2. Neck Pain 3. Back Pain  PROCEDURES 1. Bilateral breast reduction.  Right reduction 1843 g, Left reduction [id number] g  COMPLICATIONS: None.  DRAINS: bilateral  INDICATIONS FOR PROCEDURE Alyssa Clay is a 54 y.o. year-old female born on 1965-07-24,with a history of symptomatic macromastia with concominant back pain, neck pain, shoulder grooving from her bra.   MRN: [id number]  CONSENT Informed consent was obtained directly from the patient. The risks, benefits and alternatives were fully discussed. Specific risks including but not limited to bleeding, infection, hematoma, seroma, scarring, pain, nipple necrosis, asymmetry, poor cosmetic results, and need for further surgery were discussed. The patient had ample opportunity to have her questions answered to her satisfaction.  DESCRIPTION OF PROCEDURE  Patient was brought into the operating room and placed in a supine position.  SCDs were placed and appropriate padding was performed.  Antibiotics were given. The patient underwent general anesthesia and the chest was prepped and draped in a sterile fashion.  A timeout was performed and all information was confirmed to be correct.  Right side: Preoperative markings were confirmed.  Incision lines were injected with 1% Xylocaine with epinephrine.  After waiting for vasoconstriction, the marked lines were incised.  A Wise-pattern superomedial breast reduction was performed by de-epithelializing the pedicle.  Using bovie to create the superomedial pedicle, and removing breast tissue from the lateral and inferior portions of the breast.  Care was taken to not undermine the breast pedicle. Hemostasis was achieved.   The nipple was removed as a graft. It was secured in saline.  Once the skin edges were close it was grafted back on into the designed location. It was secured with 4-0 Vicryl and 5-0 Monocryl. A xeroform bolster was applied.  The amputation technique was utilized due to a slight dusky appearance and it was part of the plan.  Her sternal notch to NAC was very long. This was discussed with the patient prior to the surgery and the morning of the case.  The pocket was irrigated and hemostasis confirmed.  The deep tissues were approximated with 3-0 monocryl sutures and the skin was closed with deep dermal and subcuticular 4-0 Monocryl sutures.  The skin flaps had good capillary refill at the end of the procedure.    Left side: Preoperative markings were confirmed.  Incision lines were injected with 1% Xylocaine with epinephrine.  After waiting for vasoconstriction, the marked lines were incised.  A Wise-pattern superomedial breast reduction was performed by de-epithelializing the pedicle, using bovie to create the superomedial pedicle, and removing breast tissue from the lateral and inferior portions of the breast.  Care was taken to not undermine the breast pedicle. Hemostasis was achieved.  The nipple was removed as a graft. It was secured in saline.  Once the skin edges were close it was grafted back on into the designed location.  It was secured with 4-0 Vicryl and 5-0 Monocryl. A xeroform bolster was applied.   The patient was sat upright and size and shape symmetry was confirmed.  The pocket was irrigated and hemostasis confirmed.  The deep tissues were approximated with 3-0 monocryl sutures and the skin was closed with deep dermal and subcuticular 4-0 Monocryl sutures.  Dermabond was applied.  A breast  binder and ABDs were placed.  The skin flaps had good capillary refill at the end of the procedure.  The patient tolerated the procedure well. The patient was allowed to wake from anesthesia and taken to the  recovery room in satisfactory condition.  The advanced practice practitioner (APP) assisted throughout the case.  The APP was essential in retraction and counter traction when needed to make the case progress smoothly.  This retraction and assistance made it possible to see the tissue plans for the procedure.  The assistance was needed for blood control, tissue re-approximation and assisted with closure of the incision site.  The [place] was signed into law in 2016 which includes the topic of electronic health records.  This provides immediate access to information in MyChart.  This includes consultation notes, operative notes, office notes, lab results and pathology reports.  If you have any questions about what you read please let us know at your next visit or call us at the office.  We are right here with you.

## 2019-06-12 NOTE — Interval H&P Note (Signed)
History and Physical Interval Note:  06/12/2019 12:39 PM  Alyssa Clay  has presented today for surgery, with the diagnosis of mammary hypertrophy.  The various methods of treatment have been discussed with the patient and family. After consideration of risks, benefits and other options for treatment, the patient has consented to  Procedure(s) with comments: BREAST REDUCTION WITH LIPOSUCTION (Bilateral) - 4 hours, please as a surgical intervention.  The patient's history has been reviewed, patient examined, no change in status, stable for surgery.  I have reviewed the patient's chart and labs.  Questions were answered to the patient's satisfaction.     Loel Lofty Audreyanna Butkiewicz

## 2019-06-13 ENCOUNTER — Other Ambulatory Visit: Payer: Self-pay | Admitting: Family Medicine

## 2019-06-13 ENCOUNTER — Encounter: Payer: Self-pay | Admitting: *Deleted

## 2019-06-13 DIAGNOSIS — N62 Hypertrophy of breast: Secondary | ICD-10-CM | POA: Diagnosis not present

## 2019-06-13 NOTE — Discharge Instructions (Signed)
**You had 1000 mg of Tylenol at 7:20 AM **You had 600 mg of Ibuprofen at 6:00 AM  INSTRUCTIONS FOR AFTER SURGERY   You will likely have some questions about what to expect following your operation.  The following information will help you and your family understand what to expect when you are discharged from the hospital.  Following these guidelines will help ensure a smooth recovery and reduce risks of complications.  Postoperative instructions include information on: diet, wound care, medications and physical activity.  AFTER SURGERY Expect to go home after the procedure.  In some cases, you may need to spend one night in the hospital for observation.  DIET This surgery does not require a specific diet.  However, I have to mention that the healthier you eat the better your body can start healing. It is important to increasing your protein intake.  This means limiting the foods with added sugar.  Focus on fruits and vegetables and some meat.  If you have any liposuction during your procedure be sure to drink water.  If your urine is bright yellow, then it is concentrated, and you need to drink more water.  As a general rule after surgery, you should have 8 ounces of water every hour while awake.  If you find you are persistently nauseated or unable to take in liquids let us know.  NO TOBACCO USE or EXPOSURE.  This will slow your healing process and increase the risk of a wound.  WOUND CARE If you don't have a drain: You can shower the day after surgery.  Use fragrance free soap.  Dial, [place], [place] and Cetaphil are usually mild on the skin.  If you have steri-strips / tape directly attached to your skin leave them in place. It is OK to get these wet.  No baths, pools or hot tubs for two weeks. We close your incision to leave the smallest and best-looking scar. No ointment or creams on your incisions until given the go ahead.  Especially not Neosporin (Too many skin reactions with this one).  A few  weeks after surgery you can use Mederma and start massaging the scar. We ask you to wear your binder or sports bra for the first 6 weeks around the clock, including while sleeping. This provides added comfort and helps reduce the fluid accumulation at the surgery site.  ACTIVITY No heavy lifting until cleared by the doctor.  It is OK to walk and climb stairs. In fact, moving your legs is very important to decrease your risk of a blood clot.  It will also help keep you from getting deconditioned.  Every 1 to 2 hours get up and walk for 5 minutes. This will help with a quicker recovery back to normal.  Let pain be your guide so you don't do too much.  NO, you cannot do the spring cleaning and don't plan on taking care of anyone else.  This is your time for TLC.   WORK Everyone returns to work at different times. As a rough guide, most people take at least 1 - 2 weeks off prior to returning to work. If you need documentation for your job, bring the forms to your postoperative follow up visit.  DRIVING Arrange for someone to bring you home from the hospital.  You may be able to drive a few days after surgery but not while taking any narcotics or valium.  BOWEL MOVEMENTS Constipation can occur after anesthesia and while taking pain medication.  It is important to stay ahead for your comfort.  We recommend taking Milk of Magnesia (2 tablespoons; twice a day) while taking the pain pills.  SEROMA This is fluid your body tried to put in the surgical site.  This is normal but if it creates excessive pain and swelling let us know.  It usually decreases in a few weeks.  MEDICATIONS and PAIN CONTROL At your preoperative visit for you history and physical you were given the following medications: 1. An antibiotic: Start this medication when you get home and take according to the instructions on the bottle. 2. Zofran 4 mg:  This is to treat nausea and vomiting.  You can take this every 6 hours as needed and  only if needed. 3. Norco (hydrocodone/acetaminophen) 5/325 mg:  This is only to be used after you have taken the motrin or the tylenol. Every 8 hours as needed. Over the counter Medication to take: 4. Ibuprofen (Motrin) 600 mg:  Take this every 6 hours.  If you have additional pain then take 500 mg of the tylenol.  Only take the Norco after you have tried these two. 5. Miralax or stool softener of choice: Take this according to the bottle if you take the [place] Call your surgeon's office if any of the following occur:  Fever 101 degrees F or greater  Excessive bleeding or fluid from the incision site.  Pain that increases over time without aid from the medications  Redness, warmth, or pus draining from incision sites  Persistent nausea or inability to take in liquids  Severe misshapen area that underwent the operation.    Post Anesthesia Home Care Instructions  Activity: Get plenty of rest for the remainder of the day. A responsible individual must stay with you for 24 hours following the procedure.  For the next 24 hours, DO NOT: -Drive a car -Paediatric nurse -Drink alcoholic beverages -Take any medication unless instructed by your physician -Make any legal decisions or sign important papers.  Meals: Start with liquid foods such as gelatin or soup. Progress to regular foods as tolerated. Avoid greasy, spicy, heavy foods. If nausea and/or vomiting occur, drink only clear liquids until the nausea and/or vomiting subsides. Call your physician if vomiting continues.  Special Instructions/Symptoms: Your throat may feel dry or sore from the anesthesia or the breathing tube placed in your throat during surgery. If this causes discomfort, gargle with warm salt water. The discomfort should disappear within 24 hours.  If you had a scopolamine patch placed behind your ear for the management of post- operative nausea and/or vomiting:  1. The medication in the patch is  effective for 72 hours, after which it should be removed.  Wrap patch in a tissue and discard in the trash. Wash hands thoroughly with soap and water. 2. You may remove the patch earlier than 72 hours if you experience unpleasant side effects which may include dry mouth, dizziness or visual disturbances. 3. Avoid touching the patch. Wash your hands with soap and water after contact with the patch.      About my Jackson-Pratt Bulb Drain  What is a Jackson-Pratt bulb? A Jackson-Pratt is a soft, round device used to collect drainage. It is connected to a long, thin drainage catheter, which is held in place by one or two small stiches near your surgical incision site. When the bulb is squeezed, it forms a vacuum, forcing the drainage to empty into the bulb.  Emptying the Jackson-Pratt  bulb- To empty the bulb: 1. Release the plug on the top of the bulb. 2. Pour the bulb's contents into a measuring container which your nurse will provide. 3. Record the time emptied and amount of drainage. Empty the drain(s) as often as your     doctor or nurse recommends.  Date                  Time                    Amount (Drain 1)                 Amount (Drain 2)  _____________________________________________________________________  _____________________________________________________________________  _____________________________________________________________________  _____________________________________________________________________  _____________________________________________________________________  _____________________________________________________________________  _____________________________________________________________________  _____________________________________________________________________  Squeezing the Jackson-Pratt Bulb- To squeeze the bulb: 1. Make sure the plug at the top of the bulb is open. 2. Squeeze the bulb tightly in your fist. You will hear air squeezing  from the bulb. 3. Replace the plug while the bulb is squeezed. 4. Use a safety pin to attach the bulb to your clothing. This will keep the catheter from     pulling at the bulb insertion site.  When to call your doctor- Call your doctor if:  Drain site becomes red, swollen or hot.  You have a fever greater than 101 degrees F.  There is oozing at the drain site.  Drain falls out (apply a guaze bandage over the drain hole and secure it with tape).  Drainage increases daily not related to activity patterns. (You will usually have more drainage when you are active than when you are resting.)  Drainage has a bad odor.

## 2019-06-13 NOTE — Discharge Summary (Signed)
Physician Discharge Summary  Patient ID: Alyssa Clay MRN: [id number] DOB/AGE: November 10, 1965 54 y.o.  Admit date: 06/12/2019 Discharge date: 06/13/2019  Admission Diagnoses: Sy,ptomatic mammary hypertrophy  Discharge Diagnoses:  Active Problems:   Symptomatic mammary hypertrophy   Discharged Condition: good  Hospital Course: Patient is resting in bed this morning. Husband by the bedside.  Reports she is doing well. No overnight events.  She has been weaned off the O2 and is back able to maintain baseline of 93-95% without supplemental O2.. Pain well controlled.  Incisions c/d/i. Leakage of serosanguinous fluid around the drain sites. Serosanguinous fluid in drain bulbs bilaterally. No signs of seroma/hematoma. She will use her spirometer at home for the next few days to continue to expand her lungs. She has O2 at home that she uses occassionally.  Consults: None  Significant Diagnostic Studies: none  Treatments: surgery:   Discharge Exam: Blood pressure (!) 103/53, pulse 97, temperature 98.6 F (37 C), resp. rate 16, height 5' 5.5" (1.664 m), weight 123.6 kg, SpO2 96 %. General appearance: alert, cooperative and no distress Head: Normocephalic, without obvious abnormality, atraumatic Eyes: EOMs intact Resp: normal effort. Baseline 93-95% on room air, able to maintain this level on her own. Breasts: Incisions c/d/i. Bolsters in place over nipple grafts. Serosanguinous fluid in bilateral drain bulbs, some drainage leaking around drain sites. Binder in place.   Disposition: Discharge disposition: 01-Home or Self Care       Discharge Instructions    Call MD for:  difficulty breathing, headache or visual disturbances   Complete by: As directed    Call MD for:  extreme fatigue   Complete by: As directed    Call MD for:  hives   Complete by: As directed    Call MD for:  persistant dizziness or light-headedness   Complete by: As directed    Call MD for:  persistant nausea and  vomiting   Complete by: As directed    Call MD for:  redness, tenderness, or signs of infection (pain, swelling, redness, odor or green/yellow discharge around incision site)   Complete by: As directed    Call MD for:  severe uncontrolled pain   Complete by: As directed    Diet - low sodium heart healthy   Complete by: As directed    Increase activity slowly   Complete by: As directed      Allergies as of 06/13/2019   No Known Allergies     Medication List    TAKE these medications   Aimovig 140 MG/ML Soaj Generic drug: Erenumab-aooe Inject into the skin.   albuterol (2.5 MG/3ML) 0.083% nebulizer solution Commonly known as: PROVENTIL Take 2.5 mg by nebulization every 6 (six) hours as needed for wheezing or shortness of breath.   albuterol 108 (90 Base) MCG/ACT inhaler Commonly known as: VENTOLIN HFA Inhale 2 puffs into the lungs every 6 (six) hours as needed for wheezing or shortness of breath.   budesonide-formoterol 160-4.5 MCG/ACT inhaler Commonly known as: Symbicort Inhale 2 puffs into the lungs 2 (two) times daily.   calcium carbonate 1500 (600 Ca) MG Tabs tablet Commonly known as: OSCAL Take 600 mg of elemental calcium by mouth 2 (two) times daily with a meal.   cyclobenzaprine 10 MG tablet Commonly known as: FLEXERIL Take 10 mg by mouth 3 (three) times daily as needed (migraines).   DULoxetine 60 MG capsule Commonly known as: CYMBALTA TAKE 1 CAPSULE BY MOUTH EVERY DAY What changed: how much to take  Fasenra 30 MG/ML Sosy Generic drug: Benralizumab SECOND SHIP: INJECT ONE SYRINGE UNDER THE SKIN AT WEEK 4 AND 8, THEN EVERY 8 WEEKS THEREAFTER. What changed: See the new instructions.   fluticasone 50 MCG/ACT nasal spray Commonly known as: FLONASE Place 1 spray into both nostrils 2 (two) times daily.   Flutter Devi Use 3 times daily as directed   gabapentin 300 MG capsule Commonly known as: NEURONTIN Take 300-900 mg by mouth See admin instructions. Take  1 tablet every AM, three tablets at bedtime   levonorgestrel 20 MCG/24HR IUD Commonly known as: MIRENA 1 each by Intrauterine route once.   losartan 50 MG tablet Commonly known as: COZAAR TAKE 1 TABLET BY MOUTH EVERY DAY   montelukast 10 MG tablet Commonly known as: SINGULAIR Take 1 tablet (10 mg total) by mouth daily.   multivitamin with minerals Tabs tablet Take 1 tablet by mouth daily.   Nurtec 75 MG Tbdp Generic drug: Rimegepant Sulfate Take by mouth.   pantoprazole 40 MG tablet Commonly known as: PROTONIX Take 1 tablet (40 mg total) by mouth 2 (two) times daily before a meal.   tiZANidine 4 MG tablet Commonly known as: ZANAFLEX Take 4 mg by mouth every 6 (six) hours as needed for muscle spasms.   traZODone 100 MG tablet Commonly known as: DESYREL TAKE 1-2 TABLETS (100-200 MG TOTAL) BY MOUTH AT BEDTIME AS NEEDED FOR SLEEP.   valACYclovir 1000 MG tablet Commonly known as: VALTREX Take 1 tablet (1,000 mg total) by mouth 2 (two) times daily.      Follow-up Information    Dillingham, Loel Lofty, DO In 1 week.   Specialty: Plastic Surgery Contact information: [address] [place] [address] [phone]          Dr. Lyndee Leo [facility] Dillingham [address] [place], [place]  [id number]  Signed: Threasa Heads 06/13/2019, 8:08 AM

## 2019-06-16 ENCOUNTER — Telehealth: Payer: Self-pay

## 2019-06-16 LAB — SURGICAL PATHOLOGY

## 2019-06-16 NOTE — Telephone Encounter (Signed)
Surgical Clearance was received.//AB/CMA

## 2019-06-16 NOTE — Telephone Encounter (Signed)
Patient left voicemail to report increased bleeding and drainage to her right breast compared to her left. She would like a call back to discuss options for treatment.

## 2019-06-16 NOTE — Progress Notes (Signed)
   Subjective:     Patient ID: Alyssa Clay, female    DOB: July 03, 1965, 54 y.o.   MRN: [id number]  Chief Complaint  Patient presents with  . Breast Problem    for drain    HPI: The patient is a 54 y.o. female here for follow-up after bilateral breast reduction (right reduction 1843 g, left reduction 1822 g) on 06/12/2019 with Dr. Marla Roe.  Alyssa Clay is doing very well.  Accompanied today by her husband.  Incisions are healing nicely, C/D/I.  No signs of infection, redness.  She is having some drainage around the drain site.  Reports some tenderness and pain that is well controlled.  Some swelling is present.  Denies fever, N/V, purulent drainage.    Review of Systems  Constitutional: Negative for chills and fever.  Respiratory: Negative for shortness of breath.   Cardiovascular: Negative for chest pain and leg swelling.  Gastrointestinal: Negative for constipation, diarrhea, nausea and vomiting.  Skin: Negative for color change, pallor and rash.       Some drainage at the drain site. Some tenderness and swelling along lateral ends of incisions bilaterally.     Objective:   Vital Signs BP (!) 150/87 (BP Location: Left Arm, Patient Position: Sitting, Cuff Size: Large)   Pulse 93   Temp 98.5 F (36.9 C) (Temporal)   Ht 5' 5.5" (1.664 m)   Wt 274 lb (124.3 kg)   SpO2 98%   BMI 44.90 kg/m  Vital Signs and Nursing Note Reviewed  Physical Exam  Constitutional: She is oriented to person, place, and time and well-developed, well-nourished, and in no distress.  HENT:  Head: Normocephalic and atraumatic.  Eyes: EOM are normal.  Pulmonary/Chest: Effort normal.    Incisions healing well, c/i. Dermabond present. Drainage at drain site. Swelling/fluid present at lateral incisions.  Musculoskeletal:        General: Normal range of motion.     Cervical back: Normal range of motion.  Neurological: She is alert and oriented to person, place, and time. Gait normal.  Skin:  Skin is warm and dry. No rash noted. No erythema. No pallor.  Psychiatric: Mood, memory, affect and judgment normal.      Assessment/Plan:     ICD-10-CM   1. Symptomatic mammary hypertrophy  N62     Overall Alyssa Clay is doing very well.  Incisions are healing nicely, C/I.  Steri-Strips applied around the drain site.  JP drains in place bilaterally.  No signs of infection.  Follow-up on Friday to have bolsters removed from [place].  Call office with any questions/concerns or if condition worsens.  The [place] was signed into law in 2016 which includes the topic of electronic health records.  This provides immediate access to information in MyChart.  This includes consultation notes, operative notes, office notes, lab results and pathology reports.  If you have any questions about what you read please let us know at your next visit or call us at the office.  We are right here with you.   Threasa Heads, PA-C 06/17/2019, 1:44 PM

## 2019-06-16 NOTE — Telephone Encounter (Signed)
Spoke with patient. Denies fever, N/V, redness, warmth, or increased pain. Drainage leaking from drain site. Recommend changing gauze frequently. She will come in tomorrow for [place] to check the drain.

## 2019-06-17 ENCOUNTER — Ambulatory Visit (INDEPENDENT_AMBULATORY_CARE_PROVIDER_SITE_OTHER): Payer: 59 | Admitting: Plastic Surgery

## 2019-06-17 ENCOUNTER — Encounter: Payer: Self-pay | Admitting: Plastic Surgery

## 2019-06-17 ENCOUNTER — Other Ambulatory Visit: Payer: Self-pay

## 2019-06-17 ENCOUNTER — Encounter: Payer: Managed Care, Other (non HMO) | Admitting: Plastic Surgery

## 2019-06-17 VITALS — BP 150/87 | HR 93 | Temp 98.5°F | Ht 65.5 in | Wt 274.0 lb

## 2019-06-17 DIAGNOSIS — N62 Hypertrophy of breast: Secondary | ICD-10-CM

## 2019-06-19 ENCOUNTER — Ambulatory Visit: Payer: 59 | Admitting: Physical Therapy

## 2019-06-19 DIAGNOSIS — Z9889 Other specified postprocedural states: Secondary | ICD-10-CM | POA: Insufficient documentation

## 2019-06-19 NOTE — Progress Notes (Signed)
Subjective:     Patient ID: Alyssa Clay, female    DOB: [birth year], 54 y.o.   MRN: [id number]  Chief Complaint  Patient presents with  . Breast Problem    Patient here for PO follow up visit from BL breast reduction SX 06/12/19    HPI: The patient is a 54 y.o. female here for follow-up after bilateral breast reduction (right reduction 1843 g, left reduction 1822 g) on 06/12/2019 with Dr. Marla Roe.  Patient reports overall she feels she is doing very well.  Accompanied by her husband.  Drainage around the drain site opening has resolved since placing Steri-Strips around the drain opening.  Reports drainage being collecting into the drain bulbs is now equal on both sides and continuing to decrease.  Denies fever, chest pain, shortness of breath.  Breast pain well controlled.  Is excited to have the bolsters removed today from the [place].  Review of Systems  Constitutional: Negative for chills and fever.  HENT: Negative for congestion, rhinorrhea, sneezing and sore throat.   Respiratory: Negative for cough and shortness of breath.   Cardiovascular: Negative for chest pain.  Gastrointestinal: Negative for constipation, diarrhea, nausea and vomiting.  Skin: Negative for color change, pallor and rash.       Breast pain well controlled.  Drainage from the incisions has decreased since reinforcing area around the drain site with Steri-Strips.  Drainage in the drain bulbs is now similar amounts on both sides, serosanguineous in color.     Objective:   Vital Signs BP (!) 148/84 (BP Location: Left Arm, Patient Position: Sitting, Cuff Size: Large)   Pulse 94   Temp 99.1 F (37.3 C) (Temporal)   Ht 5\' 5"  (1.651 m)   Wt 276 lb (125.2 kg)   SpO2 95%   BMI 45.93 kg/m  Vital Signs and Nursing Note Reviewed  Physical Exam  Constitutional: She is oriented to person, place, and time and well-developed, well-nourished, and in no distress.  HENT:  Head: Normocephalic and atraumatic.  Eyes:  EOM are normal.  Cardiovascular: Normal rate.  Pulmonary/Chest: Effort normal.  Drains in place bilaterally, serosanguinous fluid in bulbs.  Bolsters in place bilaterally on NAC, removed today.  Some areas of NAC are dark in color. Incisions healing well, c/d/i. No signs of infection, seroma/hematoma, drainage.    Musculoskeletal:        General: Normal range of motion.     Cervical back: Normal range of motion.  Neurological: She is alert and oriented to person, place, and time. Gait normal.  Skin: Skin is warm and dry. No rash noted. No erythema. No pallor.  Psychiatric: Mood, memory, affect and judgment normal.      Assessment/Plan:     ICD-10-CM   1. S/P bilateral breast reduction  Z98.890   2. Symptomatic mammary hypertrophy  N62     Mrs. Ditullio reports she is doing well today.  States drainage from incisions has resolved near the drain site since the placement of Steri-Strips on Tuesday.  Serosanguineous fluid in drain bulbs bilaterally.  Bolsters on NAC graft were removed today.  NAC bilaterally is a combination of nice pink tissue and darker tissue, we will continue to monitor closely.  Do daily dressing changes of NAC applying either Xeroform or Vaseline and gauze.  Request submitted to [facility] to have Xeroform sent to patient.  Follow-up next week.  Call office with any questions/concerns or if condition worsens.  The 21st Century Cures Act was signed into law  in 2016 which includes the topic of electronic health records.  This provides immediate access to information in MyChart.  This includes consultation notes, operative notes, office notes, lab results and pathology reports.  If you have any questions about what you read please let us know at your next visit or call us at the office.  We are right here with you.   Threasa Heads, PA-C 06/20/2019, 12:02 PM

## 2019-06-20 ENCOUNTER — Ambulatory Visit (INDEPENDENT_AMBULATORY_CARE_PROVIDER_SITE_OTHER): Payer: 59 | Admitting: Plastic Surgery

## 2019-06-20 ENCOUNTER — Encounter: Payer: Self-pay | Admitting: Plastic Surgery

## 2019-06-20 ENCOUNTER — Other Ambulatory Visit: Payer: Self-pay

## 2019-06-20 VITALS — BP 148/84 | HR 94 | Temp 99.1°F | Ht 65.0 in | Wt 276.0 lb

## 2019-06-20 DIAGNOSIS — N62 Hypertrophy of breast: Secondary | ICD-10-CM

## 2019-06-20 DIAGNOSIS — Z9889 Other specified postprocedural states: Secondary | ICD-10-CM

## 2019-06-24 ENCOUNTER — Ambulatory Visit: Payer: 59 | Admitting: Physical Therapy

## 2019-06-25 ENCOUNTER — Encounter: Payer: Self-pay | Admitting: Gastroenterology

## 2019-06-27 ENCOUNTER — Other Ambulatory Visit: Payer: Self-pay

## 2019-06-27 ENCOUNTER — Encounter: Payer: Self-pay | Admitting: Plastic Surgery

## 2019-06-27 ENCOUNTER — Ambulatory Visit (INDEPENDENT_AMBULATORY_CARE_PROVIDER_SITE_OTHER): Payer: 59 | Admitting: Plastic Surgery

## 2019-06-27 VITALS — BP 170/80 | HR 103 | Temp 97.7°F | Ht 65.0 in | Wt 265.6 lb

## 2019-06-27 DIAGNOSIS — Z9889 Other specified postprocedural states: Secondary | ICD-10-CM

## 2019-06-27 NOTE — Progress Notes (Signed)
The patient here for follow lateral breast reduction her incisions are all healing nicely.  She has had minimal drainage from her tubes.  Went ahead and removed both drain tubes.  She can shower starting tomorrow.  She can do a sports bra or the binder whichever feels more comfortable.  Not anything too tight.  Continue with the Xeroform to the nipple areola.  Call with any questions or concerns.  And had like to see her back in the

## 2019-06-30 ENCOUNTER — Ambulatory Visit: Payer: Self-pay

## 2019-06-30 ENCOUNTER — Telehealth: Payer: Self-pay

## 2019-06-30 ENCOUNTER — Other Ambulatory Visit (HOSPITAL_COMMUNITY): Payer: Managed Care, Other (non HMO)

## 2019-06-30 NOTE — Telephone Encounter (Signed)
Patient would like to know how long she should wear the front zip up sports bra for and how long she has to wait before taking a shower.

## 2019-06-30 NOTE — Telephone Encounter (Signed)
Returned patients call. Advise her since her drains were removed at the first PO follow up, she take a shower using fragrance free soap such as Dial, [place] or [place]. No baths, pools, or hot tubs for 2 weeks.  She will need to wear a sports bra/binder for the first 6 weeks around the clock. Reminded her next follow up appointment is Friday, 07/04/19 at 1:00 with Johanna.

## 2019-07-01 ENCOUNTER — Encounter: Payer: 59 | Admitting: Physical Therapy

## 2019-07-04 ENCOUNTER — Encounter: Payer: 59 | Admitting: Plastic Surgery

## 2019-07-04 NOTE — Progress Notes (Deleted)
Office Visit Note  Patient: Alyssa Clay             Date of Birth: 1965/04/26           MRN: [id number]             PCP: Caren Macadam, MD Referring: Caren Macadam, MD Visit Date: 07/10/2019 Occupation: @GUAROCC @  Subjective:  No chief complaint on file.   History of Present Illness: Alyssa Clay is a 54 y.o. female ***   Activities of Daily Living:  Patient reports morning stiffness for *** {minute/hour:19697}.   Patient {ACTIONS;DENIES/REPORTS:21021675::"Denies"} nocturnal pain.  Difficulty dressing/grooming: {ACTIONS;DENIES/REPORTS:21021675::"Denies"} Difficulty climbing stairs: {ACTIONS;DENIES/REPORTS:21021675::"Denies"} Difficulty getting out of chair: {ACTIONS;DENIES/REPORTS:21021675::"Denies"} Difficulty using hands for taps, buttons, cutlery, and/or writing: {ACTIONS;DENIES/REPORTS:21021675::"Denies"}  No Rheumatology ROS completed.   PMFS History:  Patient Active Problem List   Diagnosis Date Noted  . S/P bilateral breast reduction 06/19/2019  . Symptomatic mammary hypertrophy 04/15/2019  . Back pain 04/15/2019  . Neck pain 04/15/2019  . History of frequent upper respiratory infection 01/16/2019  . Screening for viral disease 01/16/2019  . Atelectasis 01/16/2019  . Severe persistent asthma 01/16/2019  . Shortness of breath 01/01/2019  . Acute appendicitis 12/07/2018  . Primary osteoarthritis of both knees 07/10/2018  . Hypertension 03/27/2018  . 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 Moderate persistent asthma 01/07/2018  . Chronic rhinitis 01/07/2018  . Gastroesophageal reflux disease 01/07/2018    Past Medical History:  Diagnosis Date  . Asthma   . Depression   . 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 GERD (gastroesophageal reflux disease)   . Hypertension   . Migraine     Family History  Problem Relation Age of Onset  . Cancer Mother 22       lymphoma  . Depression  Mother   . Hypertension Father   . Heart attack Father 25  . Hypertension Brother   . High blood pressure Brother   . High Cholesterol Brother   . Healthy Daughter    Past Surgical History:  Procedure Laterality Date  . BREAST REDUCTION SURGERY Bilateral 06/12/2019   Procedure: BREAST REDUCTION WITH LIPOSUCTION;  Surgeon: Wallace Going, DO;  Location: [facility];  Service: Plastics;  Laterality: Bilateral;  4 hours, please  . CESAREAN SECTION  1994  . DILATION AND CURETTAGE OF UTERUS     multiple due to miscarriages  . KNEE ARTHROSCOPY Left 2013  . LAPAROSCOPIC APPENDECTOMY N/A 12/08/2018   Procedure: APPENDECTOMY LAPAROSCOPIC;  Surgeon: Donnie Mesa, MD;  Location: [facility];  Service: General;  Laterality: N/A;  . TENNIS ELBOW RELEASE/NIRSCHEL PROCEDURE  2009  . WISDOM TOOTH EXTRACTION     Social History   Social History Narrative  . Not on file   Immunization History  Administered Date(s) Administered  . Influenza,inj,Quad PF,6+ Mos 01/22/2013, 01/20/2019  . Pneumococcal Conjugate-13 01/24/2018     Objective: Vital Signs: There were no vitals taken for this visit.   Physical Exam   Musculoskeletal Exam: ***  CDAI Exam: CDAI Score: -- Patient Global: --; Provider Global: -- Swollen: --; Tender: -- Joint Exam 07/10/2019   No joint exam has been documented for this visit   There is currently no information documented on the homunculus. Go to the Rheumatology activity and complete the homunculus joint exam.  Investigation: No additional findings.  Imaging: No results found.  Recent Labs: Lab Results  Component Value Date  WBC 9.9 05/27/2019   HGB 13.4 05/27/2019   PLT 318.0 05/27/2019   NA 139 01/06/2019   K 4.0 01/06/2019   CL 103 01/06/2019   CO2 26 01/06/2019   GLUCOSE 113 (H) 01/06/2019   BUN 11 01/06/2019   CREATININE 0.87 01/06/2019   BILITOT 0.5 01/06/2019   ALKPHOS 60 01/06/2019   AST 14 01/06/2019   ALT 16 01/06/2019    PROT 6.9 01/06/2019   ALBUMIN 4.1 01/06/2019   CALCIUM 9.4 01/06/2019   GFRAA >60 12/14/2018    Speciality Comments: No specialty comments available.  Procedures:  No procedures performed Allergies: Patient has no known allergies.   Assessment / Plan:     Visit Diagnoses: No diagnosis found.  Orders: No orders of the defined types were placed in this encounter.  No orders of the defined types were placed in this encounter.   Face-to-face time spent with patient was *** minutes. Greater than 50% of time was spent in counseling and coordination of care.  Follow-Up Instructions: No follow-ups on file.   Earnestine Mealing, CMA  Note - This record has been created using Editor, commissioning.  Chart creation errors have been sought, but may not always  have been located. Such creation errors do not reflect on  the standard of medical care.

## 2019-07-07 ENCOUNTER — Other Ambulatory Visit (HOSPITAL_COMMUNITY): Payer: 59

## 2019-07-07 NOTE — Progress Notes (Signed)
Patient is a 54 year old female here for follow-up after bilateral breast reduction (right reduction 1843 g, left reduction 1822 g) on 06/12/2019 with Dr. Marla Roe. NAC graft. Drains removed 06/27/19.  Overall patient reports she's feeling well.  Some swelling and tenderness on the right lateral breast. Bilaterally incisions are healing well, c/d/i.  Bilateral NAC grafts are taking, Areaola is taking well, nipple area is a little slower to heal, left better than right. NAC Cap refill on left < 2s, on right ~ 3 s. Right breast has some swelling. Tried aspirating, but was unable to remove any fluid.   Continue with daily dressing changes of Xeroform and gauze on bilateral NAC's.  Continue wearing sports bra or binder 24/7 for the next 2 weeks.  May then wear sports bra or binder just during the day. May slowly increase activity as tolerated.  Follow-up in 2 weeks.  Call office with any questions/concerns or if condition worsens  The [place] was signed into law in 2016 which includes the topic of electronic health records.  This provides immediate access to information in MyChart.  This includes consultation notes, operative notes, office notes, lab results and pathology reports.  If you have any questions about what you read please let us know at your next visit or call us at the office.  We are right here with you.

## 2019-07-08 ENCOUNTER — Other Ambulatory Visit: Payer: Self-pay

## 2019-07-08 ENCOUNTER — Ambulatory Visit (INDEPENDENT_AMBULATORY_CARE_PROVIDER_SITE_OTHER): Payer: 59 | Admitting: Plastic Surgery

## 2019-07-08 ENCOUNTER — Encounter: Payer: Self-pay | Admitting: Plastic Surgery

## 2019-07-08 VITALS — BP 124/82 | HR 90 | Temp 98.2°F | Wt 273.0 lb

## 2019-07-08 DIAGNOSIS — Z9889 Other specified postprocedural states: Secondary | ICD-10-CM

## 2019-07-10 ENCOUNTER — Ambulatory Visit: Payer: Self-pay | Admitting: Rheumatology

## 2019-07-11 ENCOUNTER — Encounter: Payer: Managed Care, Other (non HMO) | Admitting: Plastic Surgery

## 2019-07-14 ENCOUNTER — Ambulatory Visit: Payer: Self-pay

## 2019-07-16 ENCOUNTER — Ambulatory Visit: Payer: Self-pay

## 2019-07-21 NOTE — Progress Notes (Signed)
Patient is a 54 year old female here for follow-up after bilateral breast reduction (right reduction 1843 g, left reduction 1822 g) on 06/12/2019 with Dr. Marla Roe.  NAC grafts.  Drains removed 06/27/2019.  Ms. Bonzo reports she is doing very well.  Incisions are healing very well, C/D/I.  NAC has good pink color, cap refill ~ 2-3s.  Removed sutures around NAC.  Mild swelling present, improved from last visit.  5.5 weeks post-op.  At the end of the week she can stop wearing binder/sports bra at night and just wear it during the day until 3 months postop.  She may get begin to resume normal activity as tolerated.  Follow-up in 6 weeks (~ 3 mo PO).  Call office with any questions/concerns.   Pictures were obtained of the patient and placed in the chart with the patient's or guardian's permission.  The [place] was signed into law in 2016 which includes the topic of electronic health records.  This provides immediate access to information in MyChart.  This includes consultation notes, operative notes, office notes, lab results and pathology reports.  If you have any questions about what you read please let us know at your next visit or call us at the office.  We are right here with you.

## 2019-07-22 ENCOUNTER — Ambulatory Visit (INDEPENDENT_AMBULATORY_CARE_PROVIDER_SITE_OTHER): Payer: 59 | Admitting: Plastic Surgery

## 2019-07-22 ENCOUNTER — Other Ambulatory Visit: Payer: Self-pay

## 2019-07-22 ENCOUNTER — Encounter: Payer: Self-pay | Admitting: Plastic Surgery

## 2019-07-22 VITALS — BP 119/79 | HR 62 | Temp 98.1°F | Ht 60.5 in | Wt 268.0 lb

## 2019-07-22 DIAGNOSIS — Z9889 Other specified postprocedural states: Secondary | ICD-10-CM

## 2019-07-25 ENCOUNTER — Encounter: Payer: Managed Care, Other (non HMO) | Admitting: Plastic Surgery

## 2019-07-29 ENCOUNTER — Ambulatory Visit: Payer: 59 | Admitting: Gastroenterology

## 2019-08-04 ENCOUNTER — Ambulatory Visit (HOSPITAL_COMMUNITY)
Admission: EM | Admit: 2019-08-04 | Discharge: 2019-08-04 | Disposition: A | Discharge status: Home / Self Care | Payer: 59 | Attending: Family Medicine | Admitting: Family Medicine

## 2019-08-04 ENCOUNTER — Encounter (HOSPITAL_COMMUNITY): Payer: Self-pay

## 2019-08-04 ENCOUNTER — Ambulatory Visit: Payer: Self-pay | Admitting: Allergy

## 2019-08-04 ENCOUNTER — Other Ambulatory Visit: Payer: Self-pay

## 2019-08-04 DIAGNOSIS — R0982 Postnasal drip: Secondary | ICD-10-CM | POA: Diagnosis not present

## 2019-08-04 DIAGNOSIS — R05 Cough: Secondary | ICD-10-CM

## 2019-08-04 DIAGNOSIS — R059 Cough, unspecified: Secondary | ICD-10-CM

## 2019-08-04 DIAGNOSIS — J0110 Acute frontal sinusitis, unspecified: Secondary | ICD-10-CM | POA: Diagnosis not present

## 2019-08-04 DIAGNOSIS — H0289 Other specified disorders of eyelid: Secondary | ICD-10-CM

## 2019-08-04 DIAGNOSIS — H00011 Hordeolum externum right upper eyelid: Secondary | ICD-10-CM

## 2019-08-04 DIAGNOSIS — J3489 Other specified disorders of nose and nasal sinuses: Secondary | ICD-10-CM | POA: Diagnosis not present

## 2019-08-04 MED ORDER — BENZONATATE 100 MG PO CAPS: 100.0000 mg | ORAL_CAPSULE | Freq: Three times a day (TID) | ORAL | 0 refills | Status: DC

## 2019-08-04 MED ORDER — SULFAMETHOXAZOLE-TRIMETHOPRIM 800-160 MG PO TABS: 1.0000 | ORAL_TABLET | Freq: Two times a day (BID) | ORAL | 0 refills | Status: DC

## 2019-08-04 MED ORDER — FLUCONAZOLE 150 MG PO TABS: ORAL_TABLET | ORAL | 0 refills | Status: DC

## 2019-08-04 NOTE — Discharge Instructions (Signed)
You have a skin infection on your eye, likely from the stye that you had.  You also have a sinus infection.  I have sent in Bactrim for you to take twice daily for 7 days.  I have also sent in Diflucan for you to take in case a yeast infection appears.  If you are not feeling better over the next 2 days, follow-up with this office or with primary care.  I would have you follow-up with the ER for trouble swallowing, trouble breathing, other concerning symptoms.

## 2019-08-04 NOTE — ED Provider Notes (Signed)
[place]    CSN: [id number] Arrival date & time: 08/04/19  0818      History   Chief Complaint Chief Complaint  Patient presents with  . Eye Problem    HPI Alyssa Clay is a 54 y.o. female.   Patient reports that she has noticed right eye swelling for the last 5 days.  Reports that she has seen her primary care and has done a televisit.  Reports that she had been using erythromycin ointment from her primary care, and then with the televisit doctor she was told to stop the erythromycin ointment and just do warm compresses.  She reports that neither of these relieved her pain or swelling.  Also complains of sinus pain and pressure to frontal and maxillary sinuses.  Reports this has been going on for over a week.  Reports that she is now coughing starting yesterday, she believes from postnasal drip.  Also complains of right ear fullness.  Reports that she is taken Sudafed and ibuprofen and has had temporary relief up to 3 to 4 hours at a time.  Reports that her eye and her sinuses are keeping her up at night at this point.  Denies shortness of breath, chills, body aches, nausea, vomiting, diarrhea, rash, fever, other symptoms.  Per chart review, patient has history significant for asthma, hypertension.  ROS per HPI  The history is provided by the patient.    Past Medical History:  Diagnosis Date  . Asthma   . Depression   . 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 GERD (gastroesophageal reflux disease)   . Hypertension   . Migraine     Patient Active Problem List   Diagnosis Date Noted  . S/P bilateral breast reduction 06/19/2019  . Symptomatic mammary hypertrophy 04/15/2019  . Back pain 04/15/2019  . Neck pain 04/15/2019  . History of frequent upper respiratory infection 01/16/2019  . Screening for viral disease 01/16/2019  . Atelectasis 01/16/2019  . Severe persistent asthma 01/16/2019  . Shortness of breath 01/01/2019  . Acute  appendicitis 12/07/2018  . Primary osteoarthritis of both knees 07/10/2018  . Hypertension 03/27/2018  . 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 Moderate persistent asthma 01/07/2018  . Chronic rhinitis 01/07/2018  . Gastroesophageal reflux disease 01/07/2018    Past Surgical History:  Procedure Laterality Date  . BREAST REDUCTION SURGERY Bilateral 06/12/2019   Procedure: BREAST REDUCTION WITH LIPOSUCTION;  Surgeon: Wallace Going, DO;  Location: [facility];  Service: Plastics;  Laterality: Bilateral;  4 hours, please  . CESAREAN SECTION  1994  . DILATION AND CURETTAGE OF UTERUS     multiple due to miscarriages  . KNEE ARTHROSCOPY Left 2013  . LAPAROSCOPIC APPENDECTOMY N/A 12/08/2018   Procedure: APPENDECTOMY LAPAROSCOPIC;  Surgeon: Donnie Mesa, MD;  Location: [facility];  Service: General;  Laterality: N/A;  . TENNIS ELBOW RELEASE/NIRSCHEL PROCEDURE  2009  . WISDOM TOOTH EXTRACTION      OB History   No obstetric history on file.      Home Medications    Prior to Admission medications   Medication Sig Start Date End Date Taking? Authorizing Provider  albuterol (PROVENTIL HFA;VENTOLIN HFA) 108 (90 Base) MCG/ACT inhaler Inhale 2 puffs into the lungs every 6 (six) hours as needed for wheezing or shortness of breath. 06/03/18   Garnet Sierras, DO  albuterol (PROVENTIL) (2.5 MG/3ML) 0.083% nebulizer solution Take 2.5 mg by  nebulization every 6 (six) hours as needed for wheezing or shortness of breath.    [provider]  benzonatate (TESSALON) 100 MG capsule Take 1 capsule (100 mg total) by mouth every 8 (eight) hours. 08/04/19   Faustino Congress, NP  budesonide-formoterol [facility]) 160-4.5 MCG/ACT inhaler Inhale 2 puffs into the lungs 2 (two) times daily. 01/29/19   Garnet Sierras, DO  calcium carbonate (OSCAL) 1500 (600 Ca) MG TABS tablet Take 600 mg of elemental calcium by mouth 2 (two) times daily with a meal.    [provider]  cyclobenzaprine (FLEXERIL) 10 MG tablet Take 10 mg by mouth 3 (three) times daily as needed (migraines).    [provider]  DULoxetine (CYMBALTA) 60 MG capsule Take 1 capsule (60 mg total) by mouth daily. 06/13/19   Koberlein, Steele Berg, MD  Erenumab-aooe (AIMOVIG) 140 MG/ML SOAJ Inject into the skin. 12/19/18 12/20/19  [provider]  FASENRA 30 MG/ML SOSY SECOND SHIP: INJECT ONE SYRINGE UNDER THE SKIN AT WEEK 4 AND 8, THEN EVERY 8 WEEKS THEREAFTER. Patient taking differently: Inject 30 mg into the skin See admin instructions. Every 8 weeks 09/24/18   Garnet Sierras, DO  fluconazole (DIFLUCAN) 150 MG tablet Take one tablet at the onset of symptoms, if still having symptoms in 3 days, take the second tablet. 08/04/19   Faustino Congress, NP  fluticasone (FLONASE) 50 MCG/ACT nasal spray Place 1 spray into both nostrils 2 (two) times daily. 02/11/18   Garnet Sierras, DO  gabapentin (NEURONTIN) 300 MG capsule Take 300-900 mg by mouth See admin instructions. Take 1 tablet every AM, three tablets at bedtime 01/03/18   [provider]  levonorgestrel (MIRENA) 20 MCG/24HR IUD 1 each by Intrauterine route once.    [provider]  losartan (COZAAR) 50 MG tablet TAKE 1 TABLET BY MOUTH EVERY DAY 06/03/19   Koberlein, Junell C, MD  montelukast (SINGULAIR) 10 MG tablet Take 1 tablet (10 mg total) by mouth daily. 01/16/19   Garnet Sierras, DO  Multiple Vitamin (MULTIVITAMIN WITH MINERALS) TABS tablet Take 1 tablet by mouth daily.    [provider]  pantoprazole (PROTONIX) 40 MG tablet TAKE 1 TABLET BY MOUTH 2 TIMES DAILY BEFORE A MEAL. 06/13/19   Caren Macadam, MD  Respiratory Therapy Supplies (FLUTTER) DEVI Use 3 times daily as directed 01/22/19   Mannam, Praveen, MD  Rimegepant Sulfate (NURTEC) 75 MG TBDP Take by mouth.    [provider]  sulfamethoxazole-trimethoprim (BACTRIM DS) 800-160 MG tablet Take 1 tablet by mouth 2 (two) times daily for 7 days.  08/04/19 08/11/19  Faustino Congress, NP  tiZANidine (ZANAFLEX) 4 MG tablet Take 4 mg by mouth every 6 (six) hours as needed for muscle spasms.    [provider]  traZODone (DESYREL) 100 MG tablet TAKE 1-2 TABLETS (100-200 MG TOTAL) BY MOUTH AT BEDTIME AS NEEDED FOR SLEEP. 05/30/19   Koberlein, Steele Berg, MD  valACYclovir (VALTREX) 1000 MG tablet Take 1 tablet (1,000 mg total) by mouth 2 (two) times daily. 05/28/19   Caren Macadam, MD    Family History Family History  Problem Relation Age of Onset  . Cancer Mother 3       lymphoma  . Depression Mother   . Hypertension Father   . Heart attack Father 29  . Hypertension Brother   . High blood pressure Brother   . High Cholesterol Brother   . Healthy Daughter     Social History  Social History   Tobacco Use  . Smoking status: Never Smoker  . Smokeless tobacco: Never Used  Substance Use Topics  . Alcohol use: Yes    Comment: occ  . Drug use: Never     Allergies   Patient has no known allergies.   Review of Systems Review of Systems   Physical Exam Triage Vital Signs ED Triage Vitals  Enc Vitals Group     BP 08/04/19 0857 (!) 142/84     Pulse Rate 08/04/19 0857 94     Resp 08/04/19 0857 18     Temp 08/04/19 0857 98.3 F (36.8 C)     Temp Source 08/04/19 0857 Oral     SpO2 08/04/19 0857 93 %     Weight 08/04/19 0856 266 lb (120.7 kg)     Height --      Head Circumference --      Peak Flow --      Pain Score 08/04/19 0856 10     Pain Loc --      Pain Edu? --      Excl. in [place]? --    No data found.  Updated Vital Signs BP (!) 142/84 (BP Location: Right Arm)   Pulse 94   Temp 98.3 F (36.8 C) (Oral)   Resp 18   Wt 266 lb (120.7 kg)   SpO2 93%   BMI 51.09 kg/m   Visual Acuity Right Eye Distance:   Left Eye Distance:   Bilateral Distance:    Right Eye Near:   Left Eye Near:    Bilateral Near:     Physical Exam Vitals and nursing note reviewed.  Constitutional:      General: She is  not in acute distress.    Appearance: She is well-developed. She is obese. She is ill-appearing.  HENT:     Head: Normocephalic and atraumatic.     Right Ear: Tympanic membrane normal.     Left Ear: Tympanic membrane normal.     Nose:     Right Sinus: Frontal sinus tenderness present.     Left Sinus: Maxillary sinus tenderness and frontal sinus tenderness present.     Mouth/Throat:     Mouth: Mucous membranes are moist.  Eyes:     General: Lids are everted, no foreign bodies appreciated. Vision grossly intact. Gaze aligned appropriately.        Right eye: Hordeolum present.     Extraocular Movements: Extraocular movements intact.     Conjunctiva/sclera: Conjunctivae normal.     Pupils: Pupils are equal, round, and reactive to light.  Cardiovascular:     Rate and Rhythm: Normal rate and regular rhythm.     Heart sounds: Normal heart sounds. No murmur.  Pulmonary:     Effort: Pulmonary effort is normal. No respiratory distress.     Breath sounds: Normal breath sounds. No stridor. No wheezing, rhonchi or rales.  Chest:     Chest wall: No tenderness.  Abdominal:     General: Bowel sounds are normal. There is no distension.     Palpations: Abdomen is soft. There is no mass.     Tenderness: There is no abdominal tenderness. There is no right CVA tenderness, left CVA tenderness, guarding or rebound.     Hernia: No hernia is present.  Musculoskeletal:        General: Normal range of motion.     Cervical back: Neck supple.  Lymphadenopathy:     Cervical: Cervical adenopathy (Bilaterally) present.  Skin:    General: Skin is warm and dry.     Capillary Refill: Capillary refill takes less than 2 seconds.  Neurological:     General: No focal deficit present.     Mental Status: She is alert and oriented to person, place, and time.  Psychiatric:        Mood and Affect: Mood normal.        Behavior: Behavior normal.        Thought Content: Thought content normal.      UC Treatments /  Results  Labs (all labs ordered are listed, but only abnormal results are displayed) Labs Reviewed - No data to display  EKG   Radiology No results found.  Procedures Procedures (including critical care time)  Medications Ordered in UC Medications - No data to display  Initial Impression / Assessment and Plan / UC Course  I have reviewed the triage vital signs and the nursing notes.  Pertinent labs & imaging results that were available during my care of the patient were reviewed by me and considered in my medical decision making (see chart for details).     Right eyelid pain: Eyelid pain for the last 6 days.  Right eyelid is erythematous and mildly edematous.  Hordeolum is present on the upper eyelid.  Patient also very tender to frontal sinus bilaterally and right maxillary sinus.  TMs normal bilaterally.  Prescribed Bactrim twice daily x7 days.  Also prescribed Diflucan as patient is prone to yeast infections.  Discussed with patient how to take this medication.  Also prescribed Tessalon Perles for cough that is keeping her up at night.  Not overly concerned for the patient having Covid at this moment.  Patient likely has acute sinusitis and separately hordeolum to the right eye.  Instructed that if patient is not feeling better in the next 2 days, that she follow-up here or with her primary care.  Instructed that patient follow-up with the ER for trouble swallowing, trouble breathing, other concerning symptoms.  Patient verbalizes understanding and agrees with treatment plan. Final Clinical Impressions(s) / UC Diagnoses   Final diagnoses:  Pain of right eyelid  Acute non-recurrent frontal sinusitis  Tenderness over frontal sinus  Post-nasal drip  Cough  Hordeolum externum of right upper eyelid     Discharge Instructions     You have a skin infection on your eye, likely from the stye that you had.  You also have a sinus infection.  I have sent in Bactrim for you to take  twice daily for 7 days.  I have also sent in Diflucan for you to take in case a yeast infection appears.  If you are not feeling better over the next 2 days, follow-up with this office or with primary care.  I would have you follow-up with the ER for trouble swallowing, trouble breathing, other concerning symptoms.    ED Prescriptions    Medication Sig Dispense Auth. Provider   sulfamethoxazole-trimethoprim (BACTRIM DS) 800-160 MG tablet Take 1 tablet by mouth 2 (two) times daily for 7 days. 14 tablet Faustino Congress, NP   fluconazole (DIFLUCAN) 150 MG tablet Take one tablet at the onset of symptoms, if still having symptoms in 3 days, take the second tablet. 2 tablet Faustino Congress, NP   benzonatate (TESSALON) 100 MG capsule Take 1 capsule (100 mg total) by mouth every 8 (eight) hours. 21 capsule Faustino Congress, NP     PDMP not reviewed this encounter.   Faustino Congress,  NP 08/04/19 1011

## 2019-08-04 NOTE — Progress Notes (Deleted)
Follow Up Note  RE: Alyssa Clay MRN: [id number] DOB: August 02, 1965 Date of Office Visit: 08/04/2019  Referring provider: Caren Macadam, MD Primary care provider: Caren Macadam, MD  Chief Complaint: No chief complaint on file.  History of Present Illness: I had the pleasure of seeing Alyssa Clay for a follow up visit at the [facility] on 08/04/2019. She is a 54 y.o. female, who is being followed for asthma, atelectasis, history of frequent upper respiratory infection. Her previous allergy office visit was on 01/15/2019 with Dr. Maudie Mercury. Today is a regular follow up visit.  Severe persistent asthma without complication Past history - Spiriva caused coughing.  Interim history - Asthma is doing better.   Today's spirometry showed severe restriction.   Daily controller medication(s):continue Symbicort 160 2 puffs twice a day with spacer and rinse mouth afterwards. ? Continue fasenra injections. ? Start asmanex 200 1 puff twice a day with spacer and rinse mouth afterwards for 1 month and see if it helps with your breathing.   Prior to physical activity:May use albuterol rescue inhaler 2 puffs 5 to 15 minutes prior to strenuous physical activities.  Rescue medications:May use albuterol rescue inhaler 2 puffs or nebulizer every 4 to 6 hours as needed for shortness of breath, chest tightness, coughing, and wheezing. Monitor frequency of use.   During upper respiratory infections/asthma flares: Start asmanex 200 1 puff twice a day with spacer and rinse mouth afterwards for 1 week.  Follow up with pulmonology when you get appointment.  Check for alpha-1 antitrypsin deficiency.  Atelectasis 9/9/202 CT chest showed: 1. Prominent elevation of the right hemidiaphragm with associated right lung base atelectasis including complete right middle lobe atelectasis. Right hemidiaphragm paralysis not excluded.  The atelectasis is most likely contributing to her  hypoxemia and shortness of breath.  Monitor symptoms and pulse ox.  Take oxygen with you especially when going out of the home.   Start doing flutter valve 4 times a day. Instructions given on how to perform.  Referral to pulmonology was placed at last OV and by PCP.   History of frequent upper respiratory infection History of frequent bronchitis in her lifetime.  Will obtain basic immune evaluation bloodwork.  Keep track of infections.   Shortness of breath CT chest did not show PE.   See plan as above for asthma and atelectasis.   Return in about 4 weeks (around 02/12/2019).   Lab Orders     IgG, IgA, IgM     IgG 1, 2, 3, and 4     Strep pneumoniae 23 Serotypes IgG     Diphtheria / Tetanus Antibody Panel     VITAMIN D 25 Hydroxy (Vit-D Deficiency, Fractures)     Alpha-1-antitrypsin     SARS-CoV-2 Antibodies  Assessment and Plan: Alyssa Clay is a 54 y.o. female with: No problem-specific Assessment & Plan notes found for this encounter.  No follow-ups on file.  No orders of the defined types were placed in this encounter.  Lab Orders  No laboratory test(s) ordered today    Diagnostics: Spirometry:  Tracings reviewed. Her effort: {Blank single:19197::"Good reproducible efforts.","It was hard to get consistent efforts and there is a question as to whether this reflects a maximal maneuver.","Poor effort, data can not be interpreted."} FVC: ***L FEV1: ***L, ***% predicted FEV1/FVC ratio: ***% Interpretation: {Blank single:19197::"Spirometry consistent with mild obstructive disease","Spirometry consistent with moderate obstructive disease","Spirometry consistent with severe obstructive disease","Spirometry consistent with possible restrictive disease","Spirometry consistent with  mixed obstructive and restrictive disease","Spirometry uninterpretable due to technique","Spirometry consistent with normal pattern","No overt abnormalities noted given today's efforts"}.    Please see scanned spirometry results for details.  Skin Testing: {Blank single:19197::"Select foods","Environmental allergy panel","Environmental allergy panel and select foods","Food allergy panel","None","Deferred due to recent antihistamines use"}. Positive test to: ***. Negative test to: ***.  Results discussed with patient/family.   Medication List:  Current Outpatient Medications  Medication Sig Dispense Refill  . albuterol (PROVENTIL HFA;VENTOLIN HFA) 108 (90 Base) MCG/ACT inhaler Inhale 2 puffs into the lungs every 6 (six) hours as needed for wheezing or shortness of breath. 1 Inhaler 1  . albuterol (PROVENTIL) (2.5 MG/3ML) 0.083% nebulizer solution Take 2.5 mg by nebulization every 6 (six) hours as needed for wheezing or shortness of breath.    . budesonide-formoterol (SYMBICORT) 160-4.5 MCG/ACT inhaler Inhale 2 puffs into the lungs 2 (two) times daily. 1 Inhaler 5  . calcium carbonate (OSCAL) 1500 (600 Ca) MG TABS tablet Take 600 mg of elemental calcium by mouth 2 (two) times daily with a meal.    . cyclobenzaprine (FLEXERIL) 10 MG tablet Take 10 mg by mouth 3 (three) times daily as needed (migraines).    . DULoxetine (CYMBALTA) 60 MG capsule Take 1 capsule (60 mg total) by mouth daily. 90 capsule 1  . Erenumab-aooe (AIMOVIG) 140 MG/ML SOAJ Inject into the skin.    Marland Kitchen FASENRA 30 MG/ML SOSY SECOND SHIP: INJECT ONE SYRINGE UNDER THE SKIN AT WEEK 4 AND 8, THEN EVERY 8 WEEKS THEREAFTER. (Patient taking differently: Inject 30 mg into the skin See admin instructions. Every 8 weeks) 1 Syringe 8  . fluticasone (FLONASE) 50 MCG/ACT nasal spray Place 1 spray into both nostrils 2 (two) times daily. 16 g 5  . gabapentin (NEURONTIN) 300 MG capsule Take 300-900 mg by mouth See admin instructions. Take 1 tablet every AM, three tablets at bedtime    . levonorgestrel (MIRENA) 20 MCG/24HR IUD 1 each by Intrauterine route once.    Marland Kitchen losartan (COZAAR) 50 MG tablet TAKE 1 TABLET BY MOUTH EVERY DAY 30  tablet 2  . montelukast (SINGULAIR) 10 MG tablet Take 1 tablet (10 mg total) by mouth daily. 30 tablet 5  . Multiple Vitamin (MULTIVITAMIN WITH MINERALS) TABS tablet Take 1 tablet by mouth daily.    . pantoprazole (PROTONIX) 40 MG tablet TAKE 1 TABLET BY MOUTH 2 TIMES DAILY BEFORE A MEAL. 60 tablet 3  . Respiratory Therapy Supplies (FLUTTER) DEVI Use 3 times daily as directed 1 each 0  . Rimegepant Sulfate (NURTEC) 75 MG TBDP Take by mouth.    Marland Kitchen tiZANidine (ZANAFLEX) 4 MG tablet Take 4 mg by mouth every 6 (six) hours as needed for muscle spasms.    . traZODone (DESYREL) 100 MG tablet TAKE 1-2 TABLETS (100-200 MG TOTAL) BY MOUTH AT BEDTIME AS NEEDED FOR SLEEP. 180 tablet 2  . valACYclovir (VALTREX) 1000 MG tablet Take 1 tablet (1,000 mg total) by mouth 2 (two) times daily. 18 tablet 3   No current facility-administered medications for this visit.   Allergies: No Known Allergies I reviewed her past medical history, social history, family history, and environmental history and no significant changes have been reported from her previous visit.  Review of Systems  Constitutional: Positive for fever. Negative for appetite change, chills and unexpected weight change.  HENT: Negative for congestion, postnasal drip and rhinorrhea.   Eyes: Negative for itching.  Respiratory: Positive for cough and shortness of breath. Negative for chest tightness and wheezing.  Gastrointestinal: Positive for abdominal pain.  Skin: Negative for rash.  Allergic/Immunologic: Negative for environmental allergies.  Neurological: Positive for headaches.   Objective: There were no vitals taken for this visit. There is no height or weight on file to calculate BMI. Physical Exam  Constitutional: She is oriented to person, place, and time. She appears well-developed and well-nourished.  HENT:  Head: Normocephalic and atraumatic.  Right Ear: External ear normal.  Left Ear: External ear normal.  Nose: Nose normal.    Mouth/Throat: Oropharynx is clear and moist.  Eyes: Conjunctivae and EOM are normal.  Cardiovascular: Normal rate, regular rhythm and normal heart sounds. Exam reveals no gallop and no friction rub.  No murmur heard. Pulmonary/Chest: Effort normal and breath sounds normal. She has no wheezes. She has no rales.  Musculoskeletal:     Cervical back: Neck supple.  Neurological: She is alert and oriented to person, place, and time.  Skin: Skin is warm. No rash noted.  Psychiatric: She has a normal mood and affect. Her behavior is normal.  Nursing note and vitals reviewed.  Previous notes and tests were reviewed. The plan was reviewed with the patient/family, and all questions/concerned were addressed.  It was my pleasure to see Alyssa Clay today and participate in her care. Please feel free to contact me with any questions or concerns.  Sincerely,  Rexene Alberts, DO Allergy & Immunology  [facility] office: [phone] [facility] office: [place] office: [phone]

## 2019-08-04 NOTE — ED Triage Notes (Signed)
Patient reports right eye swelling started on Thursday. Patient also reports headache and feeling like her right ear is clogged.

## 2019-08-05 ENCOUNTER — Encounter: Payer: Self-pay | Admitting: Family Medicine

## 2019-08-07 ENCOUNTER — Other Ambulatory Visit: Payer: Self-pay | Admitting: Family Medicine

## 2019-08-07 ENCOUNTER — Encounter: Payer: Self-pay | Admitting: Family Medicine

## 2019-08-07 MED ORDER — AMOXICILLIN-POT CLAVULANATE 875-125 MG PO TABS: 1.0000 | ORAL_TABLET | Freq: Two times a day (BID) | ORAL | 0 refills | Status: DC

## 2019-08-08 NOTE — Telephone Encounter (Signed)
FYI:  Pt is calling in stating that her eyes has gotten worse and she is going to her eye doctor today.

## 2019-08-13 ENCOUNTER — Encounter: Payer: Self-pay | Admitting: Family Medicine

## 2019-08-13 ENCOUNTER — Telehealth: Payer: Self-pay | Admitting: Family Medicine

## 2019-08-13 NOTE — Telephone Encounter (Signed)
Pt wanted to add to message she sent earlier. She has been experiencing migraines and fever for the past 10-12 days even being on medication. She would like a call back.

## 2019-08-14 NOTE — Telephone Encounter (Signed)
Can you see if time slots Friday work for her at all?   Also not sure if she is still having fever, but it would be nice to be able to get bloodwork. She has had covid vaccines, but if still having fever, not sure we can see her in office and might have to try and just do virtual visit? (if there are exceptions to the rule let me know!)

## 2019-08-14 NOTE — Telephone Encounter (Signed)
Mychart message sent and patient scheduled Friday.

## 2019-08-15 ENCOUNTER — Encounter: Payer: Self-pay | Admitting: Family Medicine

## 2019-08-15 ENCOUNTER — Telehealth (INDEPENDENT_AMBULATORY_CARE_PROVIDER_SITE_OTHER): Payer: 59 | Admitting: Family Medicine

## 2019-08-15 VITALS — Temp 99.4°F

## 2019-08-15 DIAGNOSIS — I10 Essential (primary) hypertension: Secondary | ICD-10-CM

## 2019-08-15 DIAGNOSIS — E538 Deficiency of other specified B group vitamins: Secondary | ICD-10-CM

## 2019-08-15 DIAGNOSIS — R739 Hyperglycemia, unspecified: Secondary | ICD-10-CM

## 2019-08-15 DIAGNOSIS — E559 Vitamin D deficiency, unspecified: Secondary | ICD-10-CM

## 2019-08-15 DIAGNOSIS — A689 Relapsing fever, unspecified: Secondary | ICD-10-CM | POA: Diagnosis not present

## 2019-08-15 DIAGNOSIS — E785 Hyperlipidemia, unspecified: Secondary | ICD-10-CM

## 2019-08-15 NOTE — Progress Notes (Signed)
Virtual Visit via Video Note  I connected with Alyssa Clay  on 08/15/19 at  3:30 PM EDT by a video enabled telemedicine application and verified that I am speaking with the correct person using two identifiers.  Location patient: home Location provider:work or home office Persons participating in the virtual visit: patient, provider  I discussed the limitations of evaluation and management by telemedicine and the availability of in person appointments. The patient expressed understanding and agreed to proceed.   Alyssa Clay DOB: Feb 26, 1966 Encounter date: 08/15/2019  This is a 54 y.o. female who presents with Chief Complaint  Patient presents with  . Fever    History of present illness: Eyes were really bad for awhile; just really hurting. Antibiotics finally kicked in about Tuesday and helped with pain, swelling. She has just had fevers, even on antibiotics. Has had them for months but seemed to go away around when she had breast surgery. But now have been present for about 3 weeks. Feels ok if temp is like in 99 range, but if she does too much (even going outside or talking with friend outside) then temp spikes up to 101.5 and then feels badly. Then feels freezing, needs blankets, gets achy.   Has also had severe migraines in last couple of weeks. Now wondering if some is related to second COVID vaccine. Last was 2 weeks ago.   No skin rashes.   No joint pain unless temp is spiking.   No major changes in weight.   No abd pain, no vomiting, no loose stools. Still has 3 more days of augmentin, which has helped with eye swelling/face but not with fevers. Really now has been having fevers on and off for months. Stopped in time around breast reduction surgery.  No Known Allergies Current Meds  Medication Sig  . albuterol (PROVENTIL HFA;VENTOLIN HFA) 108 (90 Base) MCG/ACT inhaler Inhale 2 puffs into the lungs every 6 (six) hours as needed for wheezing or shortness of breath.   Marland Kitchen albuterol (PROVENTIL) (2.5 MG/3ML) 0.083% nebulizer solution Take 2.5 mg by nebulization every 6 (six) hours as needed for wheezing or shortness of breath.  Marland Kitchen amoxicillin-clavulanate (AUGMENTIN) 875-125 MG tablet Take 1 tablet by mouth 2 (two) times daily.  . benzonatate (TESSALON) 100 MG capsule Take 1 capsule (100 mg total) by mouth every 8 (eight) hours.  . budesonide-formoterol (SYMBICORT) 160-4.5 MCG/ACT inhaler Inhale 2 puffs into the lungs 2 (two) times daily.  . calcium carbonate (OSCAL) 1500 (600 Ca) MG TABS tablet Take 600 mg of elemental calcium by mouth 2 (two) times daily with a meal.  . cyclobenzaprine (FLEXERIL) 10 MG tablet Take 10 mg by mouth 3 (three) times daily as needed (migraines).  . DULoxetine (CYMBALTA) 60 MG capsule Take 1 capsule (60 mg total) by mouth daily.  Eduard Roux (AIMOVIG) 140 MG/ML SOAJ Inject into the skin.  Marland Kitchen FASENRA 30 MG/ML SOSY SECOND SHIP: INJECT ONE SYRINGE UNDER THE SKIN AT WEEK 4 AND 8, THEN EVERY 8 WEEKS THEREAFTER. (Patient taking differently: Inject 30 mg into the skin See admin instructions. Every 8 weeks)  . fluconazole (DIFLUCAN) 150 MG tablet Take one tablet at the onset of symptoms, if still having symptoms in 3 days, take the second tablet.  . fluticasone (FLONASE) 50 MCG/ACT nasal spray Place 1 spray into both nostrils 2 (two) times daily.  Marland Kitchen gabapentin (NEURONTIN) 300 MG capsule Take 300-900 mg by mouth See admin instructions. Take 1 tablet every AM, three tablets at bedtime  .  levonorgestrel (MIRENA) 20 MCG/24HR IUD 1 each by Intrauterine route once.  Marland Kitchen losartan (COZAAR) 50 MG tablet TAKE 1 TABLET BY MOUTH EVERY DAY  . montelukast (SINGULAIR) 10 MG tablet Take 1 tablet (10 mg total) by mouth daily.  . Multiple Vitamin (MULTIVITAMIN WITH MINERALS) TABS tablet Take 1 tablet by mouth daily.  . pantoprazole (PROTONIX) 40 MG tablet TAKE 1 TABLET BY MOUTH 2 TIMES DAILY BEFORE A MEAL.  Marland Kitchen Respiratory Therapy Supplies (FLUTTER) DEVI Use 3  times daily as directed  . Rimegepant Sulfate (NURTEC) 75 MG TBDP Take by mouth.  Marland Kitchen tiZANidine (ZANAFLEX) 4 MG tablet Take 4 mg by mouth every 6 (six) hours as needed for muscle spasms.  . traZODone (DESYREL) 100 MG tablet TAKE 1-2 TABLETS (100-200 MG TOTAL) BY MOUTH AT BEDTIME AS NEEDED FOR SLEEP.  Marland Kitchen valACYclovir (VALTREX) 1000 MG tablet Take 1 tablet (1,000 mg total) by mouth 2 (two) times daily.    Review of Systems  Constitutional: Positive for activity change (not doing much; little energy), chills, fatigue and fever.  HENT: Negative for congestion, sinus pressure and sinus pain.   Respiratory: Negative for cough, chest tightness, shortness of breath and wheezing.   Cardiovascular: Negative for chest pain, palpitations and leg swelling.  Gastrointestinal: Negative for abdominal pain, constipation, diarrhea, nausea and vomiting.  Genitourinary: Negative for difficulty urinating, dysuria and hematuria.  Musculoskeletal: Positive for arthralgias (on and off).  Neurological: Positive for headaches (recently; see hpi). Negative for dizziness and light-headedness.    Objective:  Temp 99.4 F (37.4 C)       BP Readings from Last 3 Encounters:  08/04/19 (!) 142/84  07/22/19 119/79  07/08/19 124/82   Wt Readings from Last 3 Encounters:  08/04/19 266 lb (120.7 kg)  07/22/19 268 lb (121.6 kg)  07/08/19 273 lb (123.8 kg)    EXAM:  GENERAL: alert, oriented, appears well and in no acute distress  HEENT: atraumatic, conjunctiva clear, no obvious abnormalities on inspection of external nose and ears. Still some right lid edema  NECK: normal movements of the head and neck  LUNGS: on inspection no signs of respiratory distress, breathing rate appears normal, no obvious gross SOB, gasping or wheezing  CV: no obvious cyanosis  MS: moves all visible extremities without noticeable abnormality  PSYCH/NEURO: pleasant and cooperative, no obvious depression or anxiety, speech and thought  processing grossly intact   Assessment/Plan  1. Recurrent fever We are going to start with bloodwork and determine next step pending these results.  - CBC with Differential/Platelet; Future - ANA; Future - C-reactive protein; Future - Sedimentation rate; Future - Lyme Ab (IgG/M) + RMSF( IgG/M); Future  2. Hyperlipidemia, unspecified hyperlipidemia type - Comprehensive metabolic panel; Future - TSH; Future - Lipid panel; Future  3. Vitamin D deficiency - VITAMIN D 25 Hydroxy (Vit-D Deficiency, Fractures); Future  4. Low serum vitamin B12 - Vitamin B12; Future  5. Essential hypertension Will continue to monitor.  - Comprehensive metabolic panel; Future  6. Hyperglycemia - Hemoglobin A1c; Future    I discussed the assessment and treatment plan with the patient. The patient was provided an opportunity to ask questions and all were answered. The patient agreed with the plan and demonstrated an understanding of the instructions.   The patient was advised to call back or seek an in-person evaluation if the symptoms worsen or if the condition fails to improve as anticipated.  I provided 25 minutes of non-face-to-face time during this encounter.   Micheline Rough, MD

## 2019-08-18 ENCOUNTER — Telehealth: Payer: Self-pay | Admitting: *Deleted

## 2019-08-18 NOTE — Telephone Encounter (Signed)
-----   Message from Caren Macadam, MD sent at 08/18/2019  7:35 AM EDT ----- Please call to set up for bloodwork. You will need to ask about fever. She has had recurring fever (but is vaccinated and this has been going on for months, so I do not feel this is COVID, but if active fever today, let me know/we can talk with Roselyn Reef)

## 2019-08-18 NOTE — Telephone Encounter (Signed)
You can refer her to the post covid care clinic perhaps. Last week we were made aware of patients who have completed their COVID-19 vaccinations and are then diagnosed with COVID-19 approximately 2 weeks or more after completing their vaccination. These are called "breakthrough" cases - having the disease despite being fully vaccinated against COVID-19.

## 2019-08-18 NOTE — Telephone Encounter (Signed)
I know it is certainly possible to get COVID after vaccination, but she has had the same symptoms for months; just recently in 3 weeks and no other sick symptoms.   Can you help her get bloodwork appointment scheduled thenat the covid clinic if that is only place she can go? Labs are ordered. Thanks!

## 2019-08-18 NOTE — Telephone Encounter (Signed)
Spoke with the pt and she stated she still has a fever and thought she was coming in for flu testing also? Message sent to PCP.

## 2019-08-18 NOTE — Telephone Encounter (Signed)
I talked with Alyssa Clay he will draw labs at 0720 tomorrow morning.

## 2019-08-18 NOTE — Telephone Encounter (Signed)
Left a message for the pt to return my call.  

## 2019-08-18 NOTE — Telephone Encounter (Signed)
Can you help with telling patient where she can go for bloodwork? She has had low grade to true fevers for at least 3 weeks now. This has actually been happening on and off for months. Last COVID testing was in feb and was negative. She has been immunized. She has no cough. Besides fatigue, no other symptoms, just on and off fevers. Bloodwork is ordered if you are able to direct her somewhere to complete. I do not feel this is COVID.   She asked about flu testing but I was not planning on doing this. Sx don't sound like flu.  Thanks for your help!

## 2019-08-19 ENCOUNTER — Other Ambulatory Visit (INDEPENDENT_AMBULATORY_CARE_PROVIDER_SITE_OTHER): Payer: 59

## 2019-08-19 ENCOUNTER — Other Ambulatory Visit: Payer: Self-pay | Admitting: Allergy

## 2019-08-19 ENCOUNTER — Other Ambulatory Visit: Payer: Self-pay

## 2019-08-19 DIAGNOSIS — A689 Relapsing fever, unspecified: Secondary | ICD-10-CM | POA: Diagnosis not present

## 2019-08-19 DIAGNOSIS — E559 Vitamin D deficiency, unspecified: Secondary | ICD-10-CM

## 2019-08-19 DIAGNOSIS — E538 Deficiency of other specified B group vitamins: Secondary | ICD-10-CM | POA: Diagnosis not present

## 2019-08-19 DIAGNOSIS — R739 Hyperglycemia, unspecified: Secondary | ICD-10-CM | POA: Diagnosis not present

## 2019-08-19 DIAGNOSIS — E785 Hyperlipidemia, unspecified: Secondary | ICD-10-CM | POA: Diagnosis not present

## 2019-08-19 DIAGNOSIS — R700 Elevated erythrocyte sedimentation rate: Secondary | ICD-10-CM

## 2019-08-19 DIAGNOSIS — I10 Essential (primary) hypertension: Secondary | ICD-10-CM

## 2019-08-19 LAB — CBC WITH DIFFERENTIAL/PLATELET
Basophils Absolute: 0 10*3/uL (ref 0.0–0.1)
Basophils Relative: 0.4 % (ref 0.0–3.0)
Eosinophils Absolute: 0 10*3/uL (ref 0.0–0.7)
Eosinophils Relative: 0.4 % (ref 0.0–5.0)
HCT: 34.5 % — ABNORMAL LOW (ref 36.0–46.0)
Hemoglobin: 11.1 g/dL — ABNORMAL LOW (ref 12.0–15.0)
Lymphocytes Relative: 19.2 % (ref 12.0–46.0)
Lymphs Abs: 1.3 10*3/uL (ref 0.7–4.0)
MCHC: 32.2 g/dL (ref 30.0–36.0)
MCV: 90.1 fl (ref 78.0–100.0)
Monocytes Absolute: 0.4 10*3/uL (ref 0.1–1.0)
Monocytes Relative: 6.3 % (ref 3.0–12.0)
Neutro Abs: 5.1 10*3/uL (ref 1.4–7.7)
Neutrophils Relative %: 73.7 % (ref 43.0–77.0)
Platelets: 282 10*3/uL (ref 150.0–400.0)
RBC: 3.83 Mil/uL — ABNORMAL LOW (ref 3.87–5.11)
RDW: 15 % (ref 11.5–15.5)
WBC: 7 10*3/uL (ref 4.0–10.5)

## 2019-08-19 LAB — COMPREHENSIVE METABOLIC PANEL
ALT: 11 U/L (ref 0–35)
AST: 11 U/L (ref 0–37)
Albumin: 4.1 g/dL (ref 3.5–5.2)
Alkaline Phosphatase: 59 U/L (ref 39–117)
BUN: 9 mg/dL (ref 6–23)
CO2: 32 mEq/L (ref 19–32)
Calcium: 9 mg/dL (ref 8.4–10.5)
Chloride: 105 mEq/L (ref 96–112)
Creatinine, Ser: 0.79 mg/dL (ref 0.40–1.20)
GFR: 76.55 mL/min (ref >60.00)
Glucose, Bld: 97 mg/dL (ref 70–99)
Potassium: 4.8 mEq/L (ref 3.5–5.1)
Sodium: 143 mEq/L (ref 135–145)
Total Bilirubin: 0.3 mg/dL (ref 0.2–1.2)
Total Protein: 6.6 g/dL (ref 6.0–8.3)

## 2019-08-19 LAB — LIPID PANEL
Cholesterol: 218 mg/dL — ABNORMAL HIGH (ref 0–200)
HDL: 53.2 mg/dL (ref >39.00)
LDL Cholesterol: 139 mg/dL — ABNORMAL HIGH (ref 0–99)
NonHDL: 164.51
Total CHOL/HDL Ratio: 4
Triglycerides: 126 mg/dL (ref 0.0–149.0)
VLDL: 25.2 mg/dL (ref 0.0–40.0)

## 2019-08-19 LAB — HEMOGLOBIN A1C: Hgb A1c MFr Bld: 5.7 % (ref 4.6–6.5)

## 2019-08-19 LAB — VITAMIN D 25 HYDROXY (VIT D DEFICIENCY, FRACTURES): VITD: 20.28 ng/mL — ABNORMAL LOW (ref 30.00–100.00)

## 2019-08-19 LAB — VITAMIN B12: Vitamin B-12: 175 pg/mL — ABNORMAL LOW (ref 211–911)

## 2019-08-19 LAB — C-REACTIVE PROTEIN: CRP: <1 mg/dL (ref 0.5–20.0)

## 2019-08-19 LAB — TSH: TSH: 2.59 u[IU]/mL (ref 0.35–4.50)

## 2019-08-19 LAB — SEDIMENTATION RATE: Sed Rate: 47 mm/hr — ABNORMAL HIGH (ref 0–30)

## 2019-08-20 ENCOUNTER — Other Ambulatory Visit (INDEPENDENT_AMBULATORY_CARE_PROVIDER_SITE_OTHER): Payer: 59

## 2019-08-20 DIAGNOSIS — R7989 Other specified abnormal findings of blood chemistry: Secondary | ICD-10-CM

## 2019-08-20 LAB — IBC + FERRITIN
Ferritin: 17.3 ng/mL (ref 10.0–291.0)
Iron: 49 ug/dL (ref 42–145)
Saturation Ratios: 11.8 % — ABNORMAL LOW (ref 20.0–50.0)
Transferrin: 296 mg/dL (ref 212.0–360.0)

## 2019-08-20 LAB — ANA: Anti Nuclear Antibody (ANA): NEGATIVE

## 2019-08-20 MED ORDER — VITAMIN D (ERGOCALCIFEROL) 1.25 MG (50000 UNIT) PO CAPS
50000.0000 [IU] | ORAL_CAPSULE | ORAL | 0 refills | Status: DC
Start: 2019-08-20 — End: 2019-12-02

## 2019-08-21 ENCOUNTER — Encounter: Payer: Self-pay | Admitting: Family Medicine

## 2019-08-21 LAB — LYMEAB(IGG/M)+RMSF(IGG/M)
LYME DISEASE AB, QUANT, IGM: <0.8 index (ref 0.00–0.79)
Lyme IgG/IgM Ab: <0.91 {ISR} (ref 0.00–0.90)
RMSF IgG: NEGATIVE
RMSF IgM: 0.14 index (ref 0.00–0.89)

## 2019-08-21 NOTE — Telephone Encounter (Signed)
See results note. 

## 2019-08-22 NOTE — Addendum Note (Signed)
Addended by: Agnes Lawrence on: 08/22/2019 08:38 AM   Modules accepted: Orders

## 2019-08-25 ENCOUNTER — Other Ambulatory Visit: Payer: Self-pay | Admitting: Allergy

## 2019-08-25 ENCOUNTER — Encounter: Payer: Self-pay | Admitting: Family Medicine

## 2019-08-29 ENCOUNTER — Encounter: Payer: Self-pay | Admitting: Family Medicine

## 2019-09-01 ENCOUNTER — Telehealth: Payer: Self-pay | Admitting: *Deleted

## 2019-09-01 NOTE — Telephone Encounter (Signed)
Please call Alyssa Clay regarding her [employer] coverage. She states that she has left 2 voicemails a couple of weeks ago. Thanks!

## 2019-09-01 NOTE — Telephone Encounter (Signed)
Called patient and advised I am working on approval.  Her Ins had denied Fasenra prefilled syringe advising not covered benefit so I am working on the [employer] and will reach out to her once I get answer back from Ins

## 2019-09-02 ENCOUNTER — Ambulatory Visit: Payer: 59 | Admitting: Surgical

## 2019-09-03 ENCOUNTER — Other Ambulatory Visit: Payer: Self-pay

## 2019-09-03 NOTE — Progress Notes (Signed)
Office Visit Note  Patient: Alyssa Clay             Date of Birth: 06-11-1965           MRN: [id number]             PCP: Caren Macadam, MD Referring: Caren Macadam, MD Visit Date: 09/16/2019 Occupation: _0 @  Subjective:  Other (patient reports fevers, PCP wanted her evaluated here. )   History of Present Illness: Alyssa Clay is a 54 y.o. female patient returns today after her initial visit on July 04, 2018.  She was seen for right hand extensor tenosynovitis.  She states that the tenosynovitis in her hand lasted for about a month and then resolved completely.  All her autoimmune work-up was negative at that time.  She states she has been having off and on low-grade fever for that reason her PCP wanted her to be evaluated by me again.  She has had multiple surgeries over the last year which include appendectomy, breast reduction surgery, sinus infection and eye infection.  He denies any joint pain .  There is no history of rash.  Activities of Daily Living:  Patient reports morning stiffness for 20 minutes.   Patient Denies nocturnal pain.  Difficulty dressing/grooming: Denies Difficulty climbing stairs: Denies Difficulty getting out of chair: Denies Difficulty using hands for taps, buttons, cutlery, and/or writing: Denies  Review of Systems  Constitutional: Positive for fatigue and fever. Negative for night sweats, weight gain and weight loss.  HENT: Negative for mouth sores, trouble swallowing, trouble swallowing, mouth dryness and nose dryness.   Eyes: Positive for pain. Negative for redness, itching, visual disturbance and dryness.  Respiratory: Negative for cough, shortness of breath and difficulty breathing.   Cardiovascular: Negative for chest pain, palpitations, hypertension, irregular heartbeat and swelling in legs/feet.  Gastrointestinal: Positive for constipation. Negative for blood in stool and diarrhea.  Endocrine: Negative for increased  urination.  Genitourinary: Negative for difficulty urinating and vaginal dryness.  Musculoskeletal: Positive for morning stiffness. Negative for arthralgias, joint pain, joint swelling, myalgias, muscle weakness, muscle tenderness and myalgias.  Skin: Negative for color change, rash, hair loss, redness, skin tightness, ulcers and sensitivity to sunlight.  Allergic/Immunologic: Negative for susceptible to infections.  Neurological: Positive for dizziness and headaches. Negative for numbness, memory loss, night sweats and weakness.  Hematological: Negative for bruising/bleeding tendency and swollen glands.  Psychiatric/Behavioral: Negative for depressed mood, confusion and sleep disturbance. The patient is not nervous/anxious.     PMFS History:  Patient Active Problem List   Diagnosis Date Noted  . S/P bilateral breast reduction 06/19/2019  . Symptomatic mammary hypertrophy 04/15/2019  . Back pain 04/15/2019  . Neck pain 04/15/2019  . History of frequent upper respiratory infection 01/16/2019  . Screening for viral disease 01/16/2019  . Atelectasis 01/16/2019  . Severe persistent asthma 01/16/2019  . Shortness of breath 01/01/2019  . Acute appendicitis 12/07/2018  . Primary osteoarthritis of both knees 07/10/2018  . Hypertension 03/27/2018  . 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 Moderate persistent asthma 01/07/2018  . Chronic rhinitis 01/07/2018  . Gastroesophageal reflux disease 01/07/2018    Past Medical History:  Diagnosis Date  . Asthma   . Depression   . 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 GERD (gastroesophageal reflux disease)   . Hypertension   . Migraine     Family History  Problem Relation Age of  Onset  . Cancer Mother 25       lymphoma  . Depression Mother   . Hypertension Father   . Heart attack Father 38  . Hypertension Brother   . High blood pressure Brother   . High Cholesterol Brother   .  Healthy Daughter    Past Surgical History:  Procedure Laterality Date  . BREAST REDUCTION SURGERY Bilateral 06/12/2019   Procedure: BREAST REDUCTION WITH LIPOSUCTION;  Surgeon: Wallace Going, DO;  Location: [place];  Service: Plastics;  Laterality: Bilateral;  4 hours, please  . CESAREAN SECTION  1994  . DILATION AND CURETTAGE OF UTERUS     multiple due to miscarriages  . KNEE ARTHROSCOPY Left 2013  . LAPAROSCOPIC APPENDECTOMY N/A 12/08/2018   Procedure: APPENDECTOMY LAPAROSCOPIC;  Surgeon: Donnie Mesa, MD;  Location: [place];  Service: General;  Laterality: N/A;  . TENNIS ELBOW RELEASE/NIRSCHEL PROCEDURE  2009  . WISDOM TOOTH EXTRACTION     Social History   Social History Narrative  . Not on file   Immunization History  Administered Date(s) Administered  . Influenza,inj,Quad PF,6+ Mos 01/22/2013, 01/20/2019  . PFIZER SARS-COV-2 Vaccination 07/12/2019, 08/02/2019  . Pneumococcal Conjugate-13 01/24/2018     Objective: Vital Signs: BP 117/71 (BP Location: Left Arm, Patient Position: Sitting, Cuff Size: Normal)   Pulse 89   Temp 98.4 F (36.9 C) (Oral)   Resp 16   Ht 5' 6" (1.676 m)   Wt 268 lb (121.6 kg)   BMI 43.26 kg/m    Physical Exam Vitals and nursing note reviewed.  Constitutional:      Appearance: She is well-developed.  HENT:     Head: Normocephalic and atraumatic.  Eyes:     Conjunctiva/sclera: Conjunctivae normal.  Cardiovascular:     Rate and Rhythm: Normal rate and regular rhythm.     Heart sounds: Normal heart sounds.  Pulmonary:     Effort: Pulmonary effort is normal.     Breath sounds: Normal breath sounds.  Abdominal:     General: Bowel sounds are normal.     Palpations: Abdomen is soft.  Musculoskeletal:     Cervical back: Normal range of motion.  Lymphadenopathy:     Cervical: No cervical adenopathy.  Skin:    General: Skin is warm and dry.     Capillary Refill: Capillary refill takes less than 2 seconds.    Neurological:     Mental Status: She is alert and oriented to person, place, and time.  Psychiatric:        Behavior: Behavior normal.      Musculoskeletal Exam: C-spine thoracic and lumbar spine with good range of motion.  Shoulder joints, elbow joints, wrist joints, MCPs PIPs and DIPs with good range of motion with no synovitis.  Hip joints, knee joints, ankles, MTPs and PIPs with good range of motion with no synovitis.  CDAI Exam: CDAI Score: -- Patient Global: --; Provider Global: -- Swollen: --; Tender: -- Joint Exam 09/16/2019   No joint exam has been documented for this visit   There is currently no information documented on the homunculus. Go to the Rheumatology activity and complete the homunculus joint exam.  Investigation: No additional findings.  Imaging: No results found.  Recent Labs: Lab Results  Component Value Date   WBC 7.0 08/19/2019   HGB 11.1 (L) 08/19/2019   PLT 282.0 08/19/2019   NA 143 08/19/2019   K 4.8 08/19/2019   CL 105 08/19/2019   CO2 32  08/19/2019   GLUCOSE 97 08/19/2019   BUN 9 08/19/2019   CREATININE 0.79 08/19/2019   BILITOT 0.3 08/19/2019   ALKPHOS 59 08/19/2019   AST 11 08/19/2019   ALT 11 08/19/2019   PROT 6.6 08/19/2019   ALBUMIN 4.1 08/19/2019   CALCIUM 9.0 08/19/2019   GFRAA >60 12/14/2018  July 04, 2018 CK 30, anti-CCP negative, _0 eta negative, ACE normal, uric acid 3.4, TSH normal 12/05/2019 ESR 26  02/07/18: RF<14, ANA-, CRP 1.1, Sed rate 27  August 19, 2019 ANA negative 12/05/2019 ESR 26  Speciality Comments: No specialty comments available.  Procedures:  No procedures performed Allergies: Patient has no known allergies.   Assessment / Plan:     Visit Diagnoses: History of fever -patient has been having low-grade fever for almost a year now.  She has had multiple surgeries over the year also.  There is no history of rash or joint pain.  It is unlikely to be stills disease.  She had most recent sed rate and  ANA which were negative.  I will obtain following labs.  Plan: Rheumatoid factor, Cyclic citrul peptide antibody, IgG, 14-3-3 eta Protein, Serum protein electrophoresis with reflex.  All previous records were reviewed.  Extensor tenosynovitis of right wrist - RF-, CCP-, 14-3-3 eta-were obtained last year which were all negative.  She has had no recurrence of joint swelling.  She states the symptoms lasted for about 1 month and resolved.  Primary osteoarthritis of both hands - X-rays obtained on July 04, 2018 were consistent with osteoarthritis and some cystic changes in the carpal bones of the right hand.  Primary osteoarthritis of both knees - Bilateral moderate osteoarthritis and moderate chondromalacia worse noted on the x-rays in March 03/2019.  Essential hypertension  Prurigo nodularis  History of asthma  History of gastroesophageal reflux (GERD)  Orders: Orders Placed This Encounter  Procedures  . Rheumatoid factor  . Cyclic citrul peptide antibody, IgG  . 14-3-3 eta Protein  . Serum protein electrophoresis with reflex   No orders of the defined types were placed in this encounter.   .  Follow-Up Instructions: Return if symptoms worsen or fail to improve, for Osteoarthritis.  She may benefit from infectious disease evaluation if her fever persists.  We will contact her with the lab results.  If her labs are abnormal we will schedule a follow-up appointment.  I have also advised her to contact us in case she develops any increased swelling.   Bo Merino, MD  Note - This record has been created using Editor, commissioning.  Chart creation errors have been sought, but may not always  have been located. Such creation errors do not reflect on  the standard of medical care.

## 2019-09-04 ENCOUNTER — Other Ambulatory Visit (INDEPENDENT_AMBULATORY_CARE_PROVIDER_SITE_OTHER): Payer: 59

## 2019-09-04 DIAGNOSIS — R700 Elevated erythrocyte sedimentation rate: Secondary | ICD-10-CM | POA: Diagnosis not present

## 2019-09-04 LAB — SEDIMENTATION RATE: Sed Rate: 26 mm/hr (ref 0–30)

## 2019-09-05 ENCOUNTER — Ambulatory Visit: Payer: 59 | Admitting: Surgical

## 2019-09-05 ENCOUNTER — Encounter: Payer: Self-pay | Admitting: Family Medicine

## 2019-09-09 ENCOUNTER — Encounter: Payer: Self-pay | Admitting: Family Medicine

## 2019-09-09 ENCOUNTER — Ambulatory Visit: Payer: 59 | Admitting: Gastroenterology

## 2019-09-16 ENCOUNTER — Ambulatory Visit (INDEPENDENT_AMBULATORY_CARE_PROVIDER_SITE_OTHER): Payer: 59 | Admitting: Surgical

## 2019-09-16 ENCOUNTER — Encounter: Payer: Self-pay | Admitting: Rheumatology

## 2019-09-16 ENCOUNTER — Ambulatory Visit (INDEPENDENT_AMBULATORY_CARE_PROVIDER_SITE_OTHER): Payer: 59 | Admitting: Rheumatology

## 2019-09-16 ENCOUNTER — Encounter: Payer: Self-pay | Admitting: Surgical

## 2019-09-16 ENCOUNTER — Other Ambulatory Visit: Payer: Self-pay

## 2019-09-16 VITALS — BP 117/71 | HR 89 | Temp 98.4°F | Resp 16 | Ht 66.0 in | Wt 268.0 lb

## 2019-09-16 VITALS — BP 150/85 | HR 90 | Temp 98.8°F

## 2019-09-16 DIAGNOSIS — M19041 Primary osteoarthritis, right hand: Secondary | ICD-10-CM

## 2019-09-16 DIAGNOSIS — Z87898 Personal history of other specified conditions: Secondary | ICD-10-CM | POA: Diagnosis not present

## 2019-09-16 DIAGNOSIS — M546 Pain in thoracic spine: Secondary | ICD-10-CM

## 2019-09-16 DIAGNOSIS — L281 Prurigo nodularis: Secondary | ICD-10-CM

## 2019-09-16 DIAGNOSIS — Z8709 Personal history of other diseases of the respiratory system: Secondary | ICD-10-CM

## 2019-09-16 DIAGNOSIS — M65831 Other synovitis and tenosynovitis, right forearm: Secondary | ICD-10-CM | POA: Diagnosis not present

## 2019-09-16 DIAGNOSIS — G8929 Other chronic pain: Secondary | ICD-10-CM

## 2019-09-16 DIAGNOSIS — N62 Hypertrophy of breast: Secondary | ICD-10-CM

## 2019-09-16 DIAGNOSIS — M170 Bilateral primary osteoarthritis of knee: Secondary | ICD-10-CM | POA: Diagnosis not present

## 2019-09-16 DIAGNOSIS — M542 Cervicalgia: Secondary | ICD-10-CM

## 2019-09-16 DIAGNOSIS — I10 Essential (primary) hypertension: Secondary | ICD-10-CM

## 2019-09-16 DIAGNOSIS — M19042 Primary osteoarthritis, left hand: Secondary | ICD-10-CM

## 2019-09-16 DIAGNOSIS — Z8719 Personal history of other diseases of the digestive system: Secondary | ICD-10-CM

## 2019-09-16 DIAGNOSIS — Z9889 Other specified postprocedural states: Secondary | ICD-10-CM

## 2019-09-16 NOTE — Progress Notes (Signed)
   Subjective:     Patient ID: Alyssa Clay, female    DOB: 1966-03-16, 54 y.o.   MRN: [id number]  Chief Complaint  Patient presents with  . Follow-up   HPI: The patient is a 54 y.o. female here for follow-up after bilateral breast reduction on 06/12/2019 with Dr. Marla Roe.  She underwent bilateral NAC grafts.  She is now just over 3 months postop.  Patient is overall doing well today in regards to her bilateral breast reduction.  She does note that she has had persistent low-grade fevers, she recently saw a rheumatology for this and had some labs drawn to further evaluate.  She also may be seeing infectious disease for further evaluation.   She is very pleased with her outcome and reports some improvement in her neck and back.  Review of Systems  Constitutional: Positive for fever.  Cardiovascular: Negative for leg swelling.  Musculoskeletal: Negative for back pain and neck stiffness.  Skin: Negative for wound.     Objective:   Vital Signs BP (!) 150/85 (BP Location: Left Arm, Patient Position: Sitting, Cuff Size: Large)   Pulse 90   Temp 98.8 F (37.1 C) (Temporal)   SpO2 95%  Vital Signs and Nursing Note Reviewed Chaperone present Physical Exam  Constitutional: She is oriented to person, place, and time and well-developed, well-nourished, and in no distress.  Pulmonary/Chest: Effort normal.    Bilateral breast incisions are C/D/I, no wound noted, skin grafted NAC complexes with well-healed incisions. No drainage noted.  Bilateral breasts are soft.  Some fat necrosis noted on exam, mostly just lateral to the right NAC.  Nontender to palpation.  No fluid wave noted.  Neurological: She is alert and oriented to person, place, and time.  Skin: Skin is warm and dry.  Psychiatric: Mood and affect normal.    Assessment/Plan:     ICD-10-CM   1. S/P bilateral breast reduction  Z98.890   2. Symptomatic mammary hypertrophy  N62   3. Chronic bilateral thoracic back pain   M54.6    G89.29   4. Neck pain  M54.2   Patient is doing really well, incisions are well-healed, no sign of any infection.  Patient is very happy with her progress.  We reviewed preop and postop photos today.   No restrictions at this time, call with any questions or concerns.  Follow-up in 6 months.  Pictures were obtained of the patient and placed in the chart with the patient's or guardian's permission.   Carola Rhine Reford Olliff, PA-C 09/16/2019, 4:36 PM

## 2019-09-22 ENCOUNTER — Other Ambulatory Visit: Payer: Self-pay | Admitting: Allergy

## 2019-09-24 ENCOUNTER — Telehealth: Payer: Self-pay | Admitting: Allergy

## 2019-09-24 ENCOUNTER — Other Ambulatory Visit: Payer: Self-pay

## 2019-09-24 MED ORDER — MONTELUKAST SODIUM 10 MG PO TABS: 10.0000 mg | ORAL_TABLET | Freq: Every day | ORAL | 0 refills | Status: DC

## 2019-09-24 NOTE — Telephone Encounter (Signed)
Sent in refill for montelukast

## 2019-09-24 NOTE — Telephone Encounter (Signed)
Patient called and made appointment for 10/13/2019 and needs a refill on her singulair now she only has 2 pills left. [facility]. [phone]

## 2019-09-26 NOTE — Progress Notes (Signed)
I will discuss results at the follow-up visit.

## 2019-10-01 ENCOUNTER — Encounter: Payer: Self-pay | Admitting: Family Medicine

## 2019-10-01 LAB — PROTEIN ELECTROPHORESIS, SERUM, WITH REFLEX
Albumin ELP: 4 g/dL (ref 3.8–4.8)
Alpha 1: 0.3 g/dL (ref 0.2–0.3)
Alpha 2: 0.7 g/dL (ref 0.5–0.9)
Beta 2: 0.4 g/dL (ref 0.2–0.5)
Beta Globulin: 0.4 g/dL (ref 0.4–0.6)
Gamma Globulin: 1 g/dL (ref 0.8–1.7)
Total Protein: 6.8 g/dL (ref 6.1–8.1)

## 2019-10-01 LAB — 14-3-3 ETA PROTEIN: 14-3-3 eta Protein: <0.2 ng/mL (ref <0.2)

## 2019-10-01 LAB — CYCLIC CITRUL PEPTIDE ANTIBODY, IGG: Cyclic Citrullin Peptide Ab: <16 UNITS

## 2019-10-01 LAB — RHEUMATOID FACTOR: Rheumatoid fact SerPl-aCnc: <14 IU/mL (ref <14)

## 2019-10-05 ENCOUNTER — Other Ambulatory Visit: Payer: Self-pay | Admitting: Family Medicine

## 2019-10-06 NOTE — Telephone Encounter (Signed)
Please set up physical if she hasn't had one in this year

## 2019-10-09 ENCOUNTER — Ambulatory Visit (INDEPENDENT_AMBULATORY_CARE_PROVIDER_SITE_OTHER): Payer: 59

## 2019-10-09 ENCOUNTER — Other Ambulatory Visit: Payer: Self-pay

## 2019-10-09 DIAGNOSIS — J4550 Severe persistent asthma, uncomplicated: Secondary | ICD-10-CM

## 2019-10-09 MED ORDER — BENRALIZUMAB 30 MG/ML ~~LOC~~ SOSY
30.0000 mg | PREFILLED_SYRINGE | Freq: Once | SUBCUTANEOUS | Status: AC
  Administered 2019-10-09: 30 mg via SUBCUTANEOUS

## 2019-10-09 NOTE — Progress Notes (Signed)
Immunotherapy   Patient Details  Name: Alyssa Clay MRN: [id number] Date of Birth: 1965-05-30  10/09/2019  Beatrix Shipper started injections for  Fasenra. She will do injections at home with the Fasenra Pen.  Epi-Pen:Prescription for Epi-Pen given  Training for FasenraPen provided and patient demonstrated proper technique by self administering her first injection. Patient waited 20 minutes in office per Dr. Gillermina Hu verbal consent. No local or systemic symptoms.   Consent signed and patient instructions given.   Rosalio Loud 10/09/2019, 2:16 PM

## 2019-10-13 ENCOUNTER — Encounter: Payer: Self-pay | Admitting: Allergy

## 2019-10-13 ENCOUNTER — Ambulatory Visit (INDEPENDENT_AMBULATORY_CARE_PROVIDER_SITE_OTHER): Payer: 59 | Admitting: Allergy

## 2019-10-13 ENCOUNTER — Other Ambulatory Visit: Payer: Self-pay

## 2019-10-13 VITALS — BP 110/70 | HR 86 | Temp 98.7°F | Ht 65.0 in | Wt 270.0 lb

## 2019-10-13 DIAGNOSIS — Z8709 Personal history of other diseases of the respiratory system: Secondary | ICD-10-CM

## 2019-10-13 DIAGNOSIS — J4550 Severe persistent asthma, uncomplicated: Secondary | ICD-10-CM | POA: Diagnosis not present

## 2019-10-13 DIAGNOSIS — J310 Chronic rhinitis: Secondary | ICD-10-CM

## 2019-10-13 DIAGNOSIS — K219 Gastro-esophageal reflux disease without esophagitis: Secondary | ICD-10-CM

## 2019-10-13 DIAGNOSIS — J9811 Atelectasis: Secondary | ICD-10-CM | POA: Diagnosis not present

## 2019-10-13 MED ORDER — MONTELUKAST SODIUM 10 MG PO TABS: 10.0000 mg | ORAL_TABLET | Freq: Every day | ORAL | 5 refills | Status: DC

## 2019-10-13 MED ORDER — BUDESONIDE-FORMOTEROL FUMARATE 160-4.5 MCG/ACT IN AERO: 2.0000 | INHALATION_SPRAY | Freq: Two times a day (BID) | RESPIRATORY_TRACT | 5 refills | Status: DC

## 2019-10-13 MED ORDER — ALBUTEROL SULFATE HFA 108 (90 BASE) MCG/ACT IN AERS: 2.0000 | INHALATION_SPRAY | Freq: Four times a day (QID) | RESPIRATORY_TRACT | 2 refills | Status: DC | PRN

## 2019-10-13 NOTE — Progress Notes (Signed)
Follow Up Note  RE: Alyssa Clay MRN: [id number] DOB: Nov 09, 1965 Date of Office Visit: 10/13/2019  Referring provider: Caren Macadam, MD Primary care provider: Caren Macadam, MD  Chief Complaint: Follow-up and Asthma  History of Present Illness: I had the pleasure of seeing Alyssa Clay for a follow up visit at the [facility] on 10/13/2019. She is a 54 y.o. female, who is being followed for asthma, atelectasis, frequent upper respiratory infections. Her previous allergy office visit was on 01/15/2019 with Dr. Maudie Clay. Today is a regular follow up visit. Up to date with COVID-19 vaccine: yes  Severe persistent asthma  Patient had breast reduction surgery in January and going to the gym more often.  She has been spiking fevers (99.4 to 100.8) sporadically and not sure why. This usually occurs in the afternoons for the past year. no covid-19 diagnosis. She was worked up by rheumatology. She may be going to see ID next.   Currently on Symbicort 160 2 puffs twice a day and Fasenra injections every 8 weeks. After today patient is going to do self administration at home.  Using albuterol 1-2 times a month mainly during exertion related activities.  Using albuterol otherwise prior to exercising with good benefit.  Denies any coughing, wheezing, chest tightness, nocturnal awakenings, ER/urgent care visits or prednisone use since the last visit.  Using oxygen at night sometimes with 2L.   Saw pulmonology once.   Atelectasis Pulse oxygen level 94-96%.  Not doing flutter valve.   History of frequent upper respiratory infection No URIs but had antibiotics during/after her surgeries.  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  This is chronic and of unclear etiology.  Hypoxia exacerbated by recent surgery and postop atelectasis There is no evidence of interstitial lung disease  or pulmonary embolism.  She can stop oxygen during the daytime and she did not desat on 6-minute walk test Will need to continue oxygen at night due to nocturnal hypoxia  Continue incentive spirometer,  flutter valve  Sleep apnea PSG reviewed with mild sleep apnea and desaturation Discussed use of CPAP.  Patient would like to avoid if possible. Given mild AHI and no symptoms of daytime somnolence will hold off CPAP therapy  Suspect nocturnal hypoxia due to right diaphragm elevation. Continue 3 L oxygen at night.  Check overnight oximetry  Encouraged exercise regimen and weight loss.  She would like to get a breast reduction surgery in the future.  Asthma On Symbicort and Fasenra per allergy  Plan/Recommendations: - Continue nocturnal oxygen, check overnight oximetry - Incentive spirometer, flutter valve - Weight loss - Continue Symbicort, Fasenra"  Assessment and Plan: Alyssa Clay is a 54 y.o. female with: Severe persistent asthma Past history - Spiriva caused coughing. Normal alpha-1 level.  Interim history - stable. Saw pulmonology.   ACT score 23.   Today's spirometry showed severe restriction.   Daily controller medication(s):continue Symbicort 160 2 puffs twice a day with spacer and rinse mouth afterwards. ? Continue fasenra injections - will be doing them at home.   Prior to physical activity:May use albuterol rescue inhaler 2 puffs 5 to 15 minutes prior to strenuous physical activities.  Rescue medications:May use albuterol rescue inhaler 2 puffs or nebulizer every 4 to 6 hours as needed for shortness of breath, chest tightness, coughing, and wheezing. Monitor frequency of use.   During upper respiratory infections/asthma flares: Start Asmanex 200 1 puff twice a  day with spacer and rinse mouth afterwards for 1 week.  Repeat spirometry at next visit.   Atelectasis Past history - 9/9/202 CT chest showed: 1. Prominent elevation of the right hemidiaphragm with  associated right lung base atelectasis including complete right middle lobe atelectasis. Right hemidiaphragm paralysis not excluded. Interim history - saw pulmonology. Using oxygen 2L at night at times.   Monitor symptoms and pulse ox.  Use oxygen as needed.  Encouraged using flutter valve and incentive spirometer.   History of frequent upper respiratory infection Past history - History of frequent bronchitis in her lifetime. Interim history - normal basic immune evaluation. No antibiotics for URI's since last visit. Noticed some mild fevers on and off with no triggers. Saw rheumatology.   Keep track of infections/bronchitis.  Recommend ID referral if fevers do not resolve.   Chronic rhinitis Past history - All seasonal and perennial aeroallergen skin tests are negative despite a positive histamine control.  Tried Flonase, Xhance, Astelin, Xyzal, Periactin, and Claritin without adequate symptom relief.  Interm history - asymptomatic.   May use Flonase + Astelin 1 spray twice a day as needed.  Nasal saline spray (i.e., Simply Saline) or nasal saline lavage (i.e., NeilMed) is recommended as needed and prior to medicated nasal sprays.  Gastroesophageal reflux disease Controlled with Protonix.   Continue pantoprazole (Protonix) as prescribed.  Continue reflux lifestyle modifications and diet.   Return in about 5 months (around 03/14/2020).  Meds ordered this encounter  Medications  . albuterol (VENTOLIN HFA) 108 (90 Base) MCG/ACT inhaler    Sig: Inhale 2 puffs into the lungs every 6 (six) hours as needed for wheezing or shortness of breath.    Dispense:  18 g    Refill:  2  . budesonide-formoterol (SYMBICORT) 160-4.5 MCG/ACT inhaler    Sig: Inhale 2 puffs into the lungs 2 (two) times daily.    Dispense:  1 Inhaler    Refill:  5  . montelukast (SINGULAIR) 10 MG tablet    Sig: Take 1 tablet (10 mg total) by mouth daily.    Dispense:  30 tablet    Refill:  5    Diagnostics: Spirometry:  Tracings reviewed. Her effort: Good reproducible efforts. FVC: 1.61L FEV1: 1.24L, 43% predicted FEV1/FVC ratio: 77% Interpretation: Spirometry consistent with possible restrictive disease.  Please see scanned spirometry results for details.  Medication List:  Current Outpatient Medications  Medication Sig Dispense Refill  . albuterol (PROVENTIL) (2.5 MG/3ML) 0.083% nebulizer solution Take 2.5 mg by nebulization every 6 (six) hours as needed for wheezing or shortness of breath.    Marland Kitchen albuterol (VENTOLIN HFA) 108 (90 Base) MCG/ACT inhaler Inhale 2 puffs into the lungs every 6 (six) hours as needed for wheezing or shortness of breath. 18 g 2  . budesonide-formoterol (SYMBICORT) 160-4.5 MCG/ACT inhaler Inhale 2 puffs into the lungs 2 (two) times daily. 1 Inhaler 5  . calcium carbonate (OSCAL) 1500 (600 Ca) MG TABS tablet Take 600 mg of elemental calcium by mouth 2 (two) times daily with a meal.    . Cyanocobalamin (VITAMIN B-12 PO) Take by mouth.    . cyclobenzaprine (FLEXERIL) 10 MG tablet Take 10 mg by mouth 3 (three) times daily as needed (migraines).    . DULoxetine (CYMBALTA) 60 MG capsule Take 1 capsule (60 mg total) by mouth daily. 90 capsule 1  . Erenumab-aooe (AIMOVIG) 140 MG/ML SOAJ Inject into the skin.    Marland Kitchen FASENRA 30 MG/ML SOSY SECOND SHIP: INJECT ONE SYRINGE UNDER THE  SKIN AT WEEK 4 AND 8, THEN EVERY 8 WEEKS THEREAFTER. (Patient taking differently: Inject 30 mg into the skin See admin instructions. Every 8 weeks) 1 Syringe 8  . Ferrous Sulfate (IRON PO) Take by mouth daily.    Marland Kitchen gabapentin (NEURONTIN) 300 MG capsule Take 300-900 mg by mouth See admin instructions. Take 1 tablet every AM, three tablets at bedtime    . losartan (COZAAR) 50 MG tablet TAKE 1 TABLET BY MOUTH EVERY DAY 90 tablet 1  . montelukast (SINGULAIR) 10 MG tablet Take 1 tablet (10 mg total) by mouth daily. 30 tablet 5  . Multiple Vitamin (MULTIVITAMIN WITH MINERALS) TABS tablet Take 1  tablet by mouth daily.    . pantoprazole (PROTONIX) 40 MG tablet TAKE 1 TABLET BY MOUTH 2 TIMES DAILY BEFORE A MEAL. 60 tablet 3  . Rimegepant Sulfate (NURTEC) 75 MG TBDP Take by mouth.    Marland Kitchen tiZANidine (ZANAFLEX) 4 MG tablet Take 4 mg by mouth every 6 (six) hours as needed for muscle spasms.    . traZODone (DESYREL) 100 MG tablet TAKE 1-2 TABLETS (100-200 MG TOTAL) BY MOUTH AT BEDTIME AS NEEDED FOR SLEEP. 180 tablet 2  . valACYclovir (VALTREX) 1000 MG tablet Take 1 tablet (1,000 mg total) by mouth 2 (two) times daily. (Patient taking differently: Take 1,000 mg by mouth as needed. ) 18 tablet 3  . Vitamin D, Ergocalciferol, (DRISDOL) 1.25 MG (50000 UNIT) CAPS capsule Take 1 capsule (50,000 Units total) by mouth every 7 (seven) days. 12 capsule 0  . levonorgestrel (MIRENA) 20 MCG/24HR IUD 1 each by Intrauterine route once. (Patient not taking: Reported on 10/13/2019)    . Respiratory Therapy Supplies (FLUTTER) DEVI Use 3 times daily as directed 1 each 0   No current facility-administered medications for this visit.   Allergies: No Known Allergies I reviewed her past medical history, social history, family history, and environmental history and no significant changes have been reported from her previous visit.  Review of Systems  Constitutional: Positive for fever. Negative for appetite change, chills and unexpected weight change.  HENT: Negative for congestion, postnasal drip and rhinorrhea.   Eyes: Negative for itching.  Respiratory: Positive for shortness of breath. Negative for chest tightness and wheezing.   Skin: Negative for rash.  Allergic/Immunologic: Negative for environmental allergies.   Objective: BP 110/70 (BP Location: Left Arm, Patient Position: Sitting, Cuff Size: Large)   Pulse 86   Temp 98.7 F (37.1 C) (Oral)   Ht 5\' 5"  (1.651 m)   Wt 270 lb (122.5 kg)   SpO2 94%   BMI 44.93 kg/m  Body mass index is 44.93 kg/m. Physical Exam Vitals and nursing note reviewed.   Constitutional:      Appearance: She is well-developed.  HENT:     Head: Normocephalic and atraumatic.     Right Ear: Tympanic membrane and external ear normal.     Left Ear: Tympanic membrane and external ear normal.     Nose: Nose normal.  Eyes:     Conjunctiva/sclera: Conjunctivae normal.  Cardiovascular:     Rate and Rhythm: Normal rate and regular rhythm.     Heart sounds: Normal heart sounds. No murmur heard.  No friction rub. No gallop.   Pulmonary:     Effort: Pulmonary effort is normal.     Breath sounds: Normal breath sounds. No wheezing or rales.  Musculoskeletal:     Cervical back: Neck supple.  Skin:    General: Skin is warm.  Findings: No rash.  Neurological:     Mental Status: She is alert and oriented to person, place, and time.  Psychiatric:        Mood and Affect: Mood normal.        Behavior: Behavior normal.    Previous notes and tests were reviewed. The plan was reviewed with the patient/family, and all questions/concerned were addressed.  It was my pleasure to see Indica today and participate in her care. Please feel free to contact me with any questions or concerns.  Sincerely,  Rexene Alberts, DO Allergy & Immunology  [facility] office: [phone] [facility] office: [place] office: [phone]

## 2019-10-13 NOTE — Assessment & Plan Note (Signed)
Controlled with Protonix.  °· Continue pantoprazole (Protonix) as prescribed. °· Continue reflux lifestyle modifications and diet.  °

## 2019-10-13 NOTE — Assessment & Plan Note (Signed)
Past history - Spiriva caused coughing. Normal alpha-1 level.  Interim history - stable. Saw pulmonology.   ACT score 23.   Today's spirometry showed severe restriction.   Daily controller medication(s):continue Symbicort 160 2 puffs twice a day with spacer and rinse mouth afterwards. ? Continue fasenra injections - will be doing them at home.   Prior to physical activity:May use albuterol rescue inhaler 2 puffs 5 to 15 minutes prior to strenuous physical activities.  Rescue medications:May use albuterol rescue inhaler 2 puffs or nebulizer every 4 to 6 hours as needed for shortness of breath, chest tightness, coughing, and wheezing. Monitor frequency of use.   During upper respiratory infections/asthma flares: Start Asmanex 200 1 puff twice a day with spacer and rinse mouth afterwards for 1 week.  Repeat spirometry at next visit.

## 2019-10-13 NOTE — Assessment & Plan Note (Signed)
Past history - History of frequent bronchitis in her lifetime. Interim history - normal basic immune evaluation. No antibiotics for URI's since last visit. Noticed some mild fevers on and off with no triggers. Saw rheumatology.   Keep track of infections/bronchitis.  Recommend ID referral if fevers do not resolve.

## 2019-10-13 NOTE — Assessment & Plan Note (Signed)
Past history - All seasonal and perennial aeroallergen skin tests are negative despite a positive histamine control.  Tried Flonase, Xhance, Astelin, Xyzal, Periactin, and Claritin without adequate symptom relief.  °Interm history - asymptomatic.  °· May use Flonase + Astelin 1 spray twice a day as needed. °· Nasal saline spray (i.e., Simply Saline) or nasal saline lavage (i.e., NeilMed) is recommended as needed and prior to medicated nasal sprays. °

## 2019-10-13 NOTE — Patient Instructions (Addendum)
Asthma/atelectasis  Daily controller medication(s):continue Symbicort 160 2 puffs twice a day with spacer and rinse mouth afterwards. ? Continue fasenra injections at home.  ? Do the flutter valve.   Prior to physical activity:May use albuterol rescue inhaler 2 puffs 5 to 15 minutes prior to strenuous physical activities.  Rescue medications:May use albuterol rescue inhaler 2 puffs or nebulizer every 4 to 6 hours as needed for shortness of breath, chest tightness, coughing, and wheezing. Monitor frequency of use.   During upper respiratory infections/asthma flares: Start asmanex 200 1 puff twice a day with spacer and rinse mouth afterwards for 1 week. Asthma control goals:  Full participation in all desired activities (may need albuterol before activity) Albuterol use two times or less a week on average (not counting use with activity) Cough interfering with sleep two times or less a month Oral steroids no more than once a year No hospitalizations  Keep track of infections/bronchitis.  Chronic rhinitis  May use Flonase + Astelin 1 spray twice a day as needed.  Nasal saline spray (i.e., Simply Saline) or nasal saline lavage (i.e., NeilMed) is recommended as needed and prior to medicated nasal sprays.  Gastroesophageal reflux disease Controlled with Protonix.   Continue pantoprazole (Protonix) as prescribed.  Continue reflux lifestyle modifications and diet.   Follow up in 5 months or sooner if needed.  Keep track of fevers - follow up with ID if needed.

## 2019-10-13 NOTE — Assessment & Plan Note (Signed)
Past history - 9/9/202 CT chest showed: 1. Prominent elevation of the right hemidiaphragm with associated right lung base atelectasis including complete right middle lobe atelectasis. Right hemidiaphragm paralysis not excluded. Interim history - saw pulmonology. Using oxygen 2L at night at times.   Monitor symptoms and pulse ox.  Use oxygen as needed.  Encouraged using flutter valve and incentive spirometer.

## 2019-10-15 ENCOUNTER — Encounter: Payer: Self-pay | Admitting: Family Medicine

## 2019-10-15 DIAGNOSIS — A689 Relapsing fever, unspecified: Secondary | ICD-10-CM

## 2019-10-17 ENCOUNTER — Ambulatory Visit: Payer: 59 | Admitting: Gastroenterology

## 2019-10-21 ENCOUNTER — Ambulatory Visit (INDEPENDENT_AMBULATORY_CARE_PROVIDER_SITE_OTHER): Payer: 59 | Admitting: Infectious Disease

## 2019-10-21 ENCOUNTER — Other Ambulatory Visit: Payer: Self-pay

## 2019-10-21 ENCOUNTER — Encounter: Payer: Self-pay | Admitting: Infectious Disease

## 2019-10-21 VITALS — BP 107/72 | HR 91 | Temp 98.1°F | Wt 266.0 lb

## 2019-10-21 DIAGNOSIS — R509 Fever, unspecified: Secondary | ICD-10-CM | POA: Diagnosis not present

## 2019-10-21 DIAGNOSIS — Z8709 Personal history of other diseases of the respiratory system: Secondary | ICD-10-CM | POA: Diagnosis not present

## 2019-10-21 DIAGNOSIS — Z9889 Other specified postprocedural states: Secondary | ICD-10-CM | POA: Diagnosis not present

## 2019-10-21 DIAGNOSIS — Z23 Encounter for immunization: Secondary | ICD-10-CM | POA: Diagnosis not present

## 2019-10-21 DIAGNOSIS — J986 Disorders of diaphragm: Secondary | ICD-10-CM

## 2019-10-21 HISTORY — DX: Fever, unspecified: R50.9

## 2019-10-21 NOTE — Addendum Note (Signed)
Addended by: Daisy Floro T on: 10/21/2019 02:01 PM   Modules accepted: Orders

## 2019-10-21 NOTE — Progress Notes (Signed)
Subjective:  Reason for infectious disease consult: Fever of unknown origin  Requesting physician Dr. Ethlyn Gallery  Patient ID: Alyssa Clay, female    DOB: 1965/05/01, 54 y.o.   MRN: [id number]  HPI   Alyssa Clay is a 54 year old Caucasian female with past medical history significant for severe migraine headaches, hypertension asthma right-sided diaphragm paralysis, respiratory infections typically every winter.  She is being seen by [place] for subjective symptoms of fever that have been going on for more than a year.  She also has objectively measured temperatures that typically can run between 99 and 200.8 typically more so in the afternoon around 2:00 or later.  They are frequently accompanied by feelings of exhaustion.  She has had 2 peppers up to 101.5 and even 102 on several occasions.  The symptoms began nearly a year ago.  They preceded her undergoing an appendectomy and she thought for quite a while that the fevers were related to her appendix.  However after appendectomy her symptoms continued.  She also underwent breast reduction surgery.  She had imaging done in September with a CT angiogram which did not show evidence of pulmonary embolism or any infectious or malignant process in the lungs or thoracic cavity.  CT abdomen pelvis was also benign.  Has been seen by Dr. Gypsy Lore and had a rheumatological work-up.  Patient largely has symptoms of fever exhaustion and malaise.  Does not sound to be him to be much else specifically associated with it.  She does have severe migraine headaches at times and currently is on prednisone for that.  She does not think the prednisone is made much of a difference in her fevers.  She is currently married and monogamous relationship.  Not had any history of sexually transmitted infections.  Has used alcohol tried marijuana in the past but no other recreational drugs.  Does have tattoos.  She does not recall having mono.  She has traveled to the [place] including [place] and also travel to [place] where they adopted a child.  She has not had any known exposure to him with tuberculosis  Past Medical History:  Diagnosis Date  . Asthma   . Depression   . 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 FUO (fever of unknown origin) 10/21/2019  . GERD (gastroesophageal reflux disease)   . Hypertension   . Migraine     Past Surgical History:  Procedure Laterality Date  . BREAST REDUCTION SURGERY Bilateral 06/12/2019   Procedure: BREAST REDUCTION WITH LIPOSUCTION;  Surgeon: Wallace Going, DO;  Location: [facility];  Service: Plastics;  Laterality: Bilateral;  4 hours, please  . CESAREAN SECTION  1994  . DILATION AND CURETTAGE OF UTERUS     multiple due to miscarriages  . KNEE ARTHROSCOPY Left 2013  . LAPAROSCOPIC APPENDECTOMY N/A 12/08/2018   Procedure: APPENDECTOMY LAPAROSCOPIC;  Surgeon: Donnie Mesa, MD;  Location: [facility];  Service: General;  Laterality: N/A;  . TENNIS ELBOW RELEASE/NIRSCHEL PROCEDURE  2009  . WISDOM TOOTH EXTRACTION      Family History  Problem Relation Age of Onset  . Cancer Mother 39       lymphoma  . Depression Mother   . Hypertension Father   . Heart attack Father 97  . Hypertension Brother   . High blood pressure Brother   . High Cholesterol Brother   . Healthy Daughter       Social History   Socioeconomic History  .  Marital status: Married    Spouse name: Not on file  . Number of children: Not on file  . Years of education: Not on file  . Highest education level: Not on file  Occupational History  . Not on file  Tobacco Use  . Smoking status: Never Smoker  . Smokeless tobacco: Never Used  Vaping Use  . Vaping Use: Never used  Substance and Sexual Activity  . Alcohol use: Yes    Comment: occ  . Drug use: Never  . Sexual activity: Not on file  Other Topics Concern  . Not on file  Social History Narrative  . Not on file   Social  Determinants of Health   Financial Resource Strain:   . Difficulty of Paying Living Expenses:   Food Insecurity:   . Worried About Charity fundraiser in the Last Year:   . Arboriculturist in the Last Year:   Transportation Needs:   . Film/video editor (Medical):   Marland Kitchen Lack of Transportation (Non-Medical):   Physical Activity:   . Days of Exercise per Week:   . Minutes of Exercise per Session:   Stress:   . Feeling of Stress :   Social Connections:   . Frequency of Communication with Friends and Family:   . Frequency of Social Gatherings with Friends and Family:   . Attends Religious Services:   . Active Member of Clubs or Organizations:   . Attends Archivist Meetings:   Marland Kitchen Marital Status:     No Known Allergies   Current Outpatient Medications:  .  albuterol (PROVENTIL) (2.5 MG/3ML) 0.083% nebulizer solution, Take 2.5 mg by nebulization every 6 (six) hours as needed for wheezing or shortness of breath., Disp: , Rfl:  .  albuterol (VENTOLIN HFA) 108 (90 Base) MCG/ACT inhaler, Inhale 2 puffs into the lungs every 6 (six) hours as needed for wheezing or shortness of breath., Disp: 18 g, Rfl: 2 .  budesonide-formoterol (SYMBICORT) 160-4.5 MCG/ACT inhaler, Inhale 2 puffs into the lungs 2 (two) times daily., Disp: 1 Inhaler, Rfl: 5 .  calcium carbonate (OSCAL) 1500 (600 Ca) MG TABS tablet, Take 600 mg of elemental calcium by mouth 2 (two) times daily with a meal., Disp: , Rfl:  .  Cyanocobalamin (VITAMIN B-12 PO), Take by mouth., Disp: , Rfl:  .  cyclobenzaprine (FLEXERIL) 10 MG tablet, Take 10 mg by mouth 3 (three) times daily as needed (migraines)., Disp: , Rfl:  .  DULoxetine (CYMBALTA) 60 MG capsule, Take 1 capsule (60 mg total) by mouth daily., Disp: 90 capsule, Rfl: 1 .  Erenumab-aooe (AIMOVIG) 140 MG/ML SOAJ, Inject into the skin., Disp: , Rfl:  .  FASENRA 30 MG/ML SOSY, SECOND SHIP: INJECT ONE SYRINGE UNDER THE SKIN AT WEEK 4 AND 8, THEN EVERY 8 WEEKS THEREAFTER.  (Patient taking differently: Inject 30 mg into the skin See admin instructions. Every 8 weeks), Disp: 1 Syringe, Rfl: 8 .  Ferrous Sulfate (IRON PO), Take by mouth daily., Disp: , Rfl:  .  gabapentin (NEURONTIN) 300 MG capsule, Take 300-900 mg by mouth See admin instructions. Take 1 tablet every AM, three tablets at bedtime, Disp: , Rfl:  .  montelukast (SINGULAIR) 10 MG tablet, Take 1 tablet (10 mg total) by mouth daily., Disp: 30 tablet, Rfl: 5 .  Multiple Vitamin (MULTIVITAMIN WITH MINERALS) TABS tablet, Take 1 tablet by mouth daily., Disp: , Rfl:  .  pantoprazole (PROTONIX) 40 MG tablet, TAKE 1 TABLET BY MOUTH  2 TIMES DAILY BEFORE A MEAL., Disp: 60 tablet, Rfl: 3 .  predniSONE (DELTASONE) 10 MG tablet, 5 tablets day 1, 4 tablets day 2, 3 tablets day 3, 2 tablets day 4, 1 tablet day 5, then stop, Disp: , Rfl:  .  Respiratory Therapy Supplies (FLUTTER) DEVI, Use 3 times daily as directed, Disp: 1 each, Rfl: 0 .  Rimegepant Sulfate (NURTEC) 75 MG TBDP, Take by mouth., Disp: , Rfl:  .  rizatriptan (MAXALT) 10 MG tablet, TAKE 1 TABLET BY MOUTH ONCE AS NEEDED FOR MIGRAINE FOR UP TO 1 DOSE. MAY REPEAT IN 2 HOURS IF NEEDED, Disp: , Rfl:  .  tiZANidine (ZANAFLEX) 4 MG tablet, Take 4 mg by mouth every 6 (six) hours as needed for muscle spasms., Disp: , Rfl:  .  traZODone (DESYREL) 100 MG tablet, TAKE 1-2 TABLETS (100-200 MG TOTAL) BY MOUTH AT BEDTIME AS NEEDED FOR SLEEP., Disp: 180 tablet, Rfl: 2 .  valACYclovir (VALTREX) 1000 MG tablet, Take 1 tablet (1,000 mg total) by mouth 2 (two) times daily. (Patient taking differently: Take 1,000 mg by mouth as needed. ), Disp: 18 tablet, Rfl: 3 .  Vitamin D, Ergocalciferol, (DRISDOL) 1.25 MG (50000 UNIT) CAPS capsule, Take 1 capsule (50,000 Units total) by mouth every 7 (seven) days., Disp: 12 capsule, Rfl: 0 .  losartan (COZAAR) 50 MG tablet, TAKE 1 TABLET BY MOUTH EVERY DAY (Patient not taking: Reported on 10/21/2019), Disp: 90 tablet, Rfl: 1    Review of  Systems  Constitutional: Positive for fatigue and fever. Negative for chills.  HENT: Negative for congestion and sore throat.   Eyes: Negative for photophobia.  Respiratory: Negative for cough, shortness of breath and wheezing.   Cardiovascular: Negative for chest pain, palpitations and leg swelling.  Gastrointestinal: Negative for abdominal pain, blood in stool, constipation, diarrhea, nausea and vomiting.  Genitourinary: Negative for dysuria, flank pain and hematuria.  Musculoskeletal: Negative for back pain and myalgias.  Skin: Negative for rash.  Neurological: Negative for dizziness, weakness and headaches.  Hematological: Does not bruise/bleed easily.  Psychiatric/Behavioral: Negative for agitation, behavioral problems, confusion, decreased concentration, dysphoric mood, hallucinations and suicidal ideas. The patient is not hyperactive.        Objective:   Physical Exam Constitutional:      General: She is not in acute distress.    Appearance: She is not diaphoretic.  HENT:     Head: Normocephalic and atraumatic.     Right Ear: External ear normal.     Left Ear: External ear normal.     Nose: Nose normal.     Mouth/Throat:     Mouth: Mucous membranes are moist.     Pharynx: Oropharynx is clear. Uvula midline. No pharyngeal swelling, oropharyngeal exudate or posterior oropharyngeal erythema.     Tonsils: No tonsillar exudate.  Eyes:     General: No scleral icterus.    Extraocular Movements: Extraocular movements intact.     Conjunctiva/sclera: Conjunctivae normal.     Pupils: Pupils are equal, round, and reactive to light.  Cardiovascular:     Rate and Rhythm: Normal rate and regular rhythm.     Heart sounds: Normal heart sounds. No murmur heard.  No friction rub. No gallop.   Pulmonary:     Effort: Pulmonary effort is normal. No respiratory distress.     Breath sounds: Normal breath sounds. No stridor. No wheezing or rales.  Abdominal:     General: Bowel sounds are  normal. There is no distension.  Palpations: Abdomen is soft. There is no mass.     Tenderness: There is no abdominal tenderness. There is no rebound.  Musculoskeletal:        General: No tenderness. Normal range of motion.     Cervical back: Normal range of motion and neck supple.  Lymphadenopathy:     Cervical: No cervical adenopathy.  Skin:    General: Skin is warm and dry.     Coloration: Skin is not pale.     Findings: No erythema or rash.  Neurological:     General: No focal deficit present.     Mental Status: She is alert and oriented to person, place, and time.     Coordination: Coordination normal.  Psychiatric:        Mood and Affect: Mood normal.        Behavior: Behavior normal.        Thought Content: Thought content normal.        Judgment: Judgment normal.           Assessment & Plan:  Fever unknown origin: I have personally reviewed all of her radiographs and find nothing in them to explain her fevers. I  explained that the differential for FUO includes infectious pathologies, autoimmune ones, malignancy and off and some other miscellaneous conditions.  I also explained that frequently we do not find the cause of fevers particularly when there is not much in the way of focal pertinent diagnostic clues.  There is no good reason in my opinion to repeat imaging given that her symptoms of fevers preclude seeded her CT angio and her CT abdomen pelvis.  I will order an echocardiogram and I will order a few O serologies including HIV hepatitis testing EBV CMV QuantiFERON gold and cryptococcal antigen and histoplasma and Blastomyces antigens Coccidioides antibodies [place] QuantiFERON gold.  I will check an LDH and a ferritin SPEP and cryoglobulins.  Recurrent infections: Largely in the wintertime and could also be slight likely related to her problems with her elevated diaphragm and asthma.  I did notice that when her antibodies to  pneumococcus were screened some of them were lower we will immunize her with Pneumovax today and recheck her antibody titers when I see her in August.

## 2019-10-26 LAB — QUANTIFERON-TB GOLD PLUS
Mitogen-NIL: >10 IU/mL
NIL: 0.02 IU/mL
QuantiFERON-TB Gold Plus: NEGATIVE
TB1-NIL: 0 IU/mL
TB2-NIL: 0 IU/mL

## 2019-10-26 LAB — SEDIMENTATION RATE: Sed Rate: 22 mm/h (ref 0–30)

## 2019-10-26 LAB — CRYPTOCOCCAL AG, LTX SCR RFLX TITER
Cryptococcal Ag Screen: NOT DETECTED
MICRO NUMBER:: 10651855
SPECIMEN QUALITY:: ADEQUATE

## 2019-10-26 LAB — CYTOMEGALOVIRUS ANTIBODY, IGG: Cytomegalovirus Ab-IgG: <0.6 U/mL

## 2019-10-26 LAB — EPSTEIN-BARR VIRUS VCA ANTIBODY PANEL
EBV NA IgG: >600 U/mL — ABNORMAL HIGH
EBV VCA IgG: >750 U/mL — ABNORMAL HIGH
EBV VCA IgM: <36 U/mL

## 2019-10-26 LAB — CMV IGM: CMV IgM: <30 AU/mL

## 2019-10-26 LAB — HEPATITIS C ANTIBODY
Hepatitis C Ab: NONREACTIVE
SIGNAL TO CUT-OFF: 0 (ref <1.00)

## 2019-10-26 LAB — PROTEIN ELECTROPHORESIS, SERUM
Albumin ELP: 4 g/dL (ref 3.8–4.8)
Alpha 1: 0.4 g/dL — ABNORMAL HIGH (ref 0.2–0.3)
Alpha 2: 0.7 g/dL (ref 0.5–0.9)
Beta 2: 0.4 g/dL (ref 0.2–0.5)
Beta Globulin: 0.5 g/dL (ref 0.4–0.6)
Gamma Globulin: 0.9 g/dL (ref 0.8–1.7)
Total Protein: 6.8 g/dL (ref 6.1–8.1)

## 2019-10-26 LAB — FERRITIN: Ferritin: 31 ng/mL (ref 16–232)

## 2019-10-26 LAB — HEPATITIS B SURFACE ANTIBODY, QUANTITATIVE: Hep B S AB Quant (Post): <5 m[IU]/mL — ABNORMAL LOW (ref >=10)

## 2019-10-26 LAB — COCCIDIOIDES ANTIBODIES: COCCIDIOIDES AB, CF, SERUM: <1:2 {titer}

## 2019-10-26 LAB — RPR: RPR Ser Ql: NONREACTIVE

## 2019-10-26 LAB — C-REACTIVE PROTEIN: CRP: 13.2 mg/L — ABNORMAL HIGH (ref <8.0)

## 2019-10-26 LAB — HEPATITIS B SURFACE ANTIGEN: Hepatitis B Surface Ag: NONREACTIVE

## 2019-10-26 LAB — CRYOGLOBULIN: Cryoglobulin, Qualitative Analysis: NOT DETECTED

## 2019-10-26 LAB — HISTOPLASMA ANTIGEN, URINE: Histoplasma Antigen, urine: <0.2

## 2019-10-26 LAB — ANGIOTENSIN CONVERTING ENZYME: Angiotensin-Converting Enzyme: 19 U/L (ref 9–67)

## 2019-10-26 LAB — CK: Total CK: 28 U/L — ABNORMAL LOW (ref 29–143)

## 2019-10-26 LAB — HIV ANTIBODY (ROUTINE TESTING W REFLEX): HIV 1&2 Ab, 4th Generation: NONREACTIVE

## 2019-10-26 LAB — LACTATE DEHYDROGENASE: LDH: 132 U/L (ref 120–250)

## 2019-10-29 ENCOUNTER — Telehealth: Payer: Self-pay | Admitting: *Deleted

## 2019-10-29 NOTE — Telephone Encounter (Signed)
Only labs of significance so far show that she does not have hepatitis B immunity by surface antibody titer.  She also has evidence of past Epstein-Barr virus infection.  Beyond that I do not think I can see anything particularly abnormal in any of her labs to explain her fevers.  If she has a specific question about a lab I would be happy to answer it but the best thing to do is for her to come to the clinic for her appointment in August and we can review what labs been done and how she is doing.

## 2019-10-29 NOTE — Telephone Encounter (Signed)
Patient called to advise she got her lab results and has questions. Advised her not all of her results are in but I will let the provider know that she called with concerns and someone will give her a call back once he reviews the labs. Mobile number verified.

## 2019-11-03 NOTE — Telephone Encounter (Signed)
Please advise on patient's lab results and question regarding her heart and exercise. Thank you. Landis Gandy, RN

## 2019-11-07 ENCOUNTER — Ambulatory Visit (HOSPITAL_COMMUNITY): Payer: Self-pay

## 2019-11-07 ENCOUNTER — Encounter (HOSPITAL_COMMUNITY): Payer: Self-pay

## 2019-11-07 ENCOUNTER — Ambulatory Visit (HOSPITAL_COMMUNITY)
Admission: EM | Admit: 2019-11-07 | Discharge: 2019-11-07 | Disposition: A | Discharge status: Home / Self Care | Payer: 59 | Attending: Urgent Care | Admitting: Urgent Care

## 2019-11-07 ENCOUNTER — Other Ambulatory Visit: Payer: Self-pay

## 2019-11-07 DIAGNOSIS — H61002 Unspecified perichondritis of left external ear: Secondary | ICD-10-CM

## 2019-11-07 DIAGNOSIS — L989 Disorder of the skin and subcutaneous tissue, unspecified: Secondary | ICD-10-CM

## 2019-11-07 DIAGNOSIS — H9202 Otalgia, left ear: Secondary | ICD-10-CM

## 2019-11-07 MED ORDER — FLUCONAZOLE 150 MG PO TABS
150.0000 mg | ORAL_TABLET | ORAL | 0 refills | Status: DC
Start: 2019-11-07 — End: 2019-12-18

## 2019-11-07 MED ORDER — NAPROXEN 500 MG PO TABS
500.0000 mg | ORAL_TABLET | Freq: Two times a day (BID) | ORAL | 0 refills | Status: DC
Start: 2019-11-07 — End: 2020-02-25

## 2019-11-07 MED ORDER — MUPIROCIN 2 % EX OINT
1.0000 | TOPICAL_OINTMENT | Freq: Three times a day (TID) | CUTANEOUS | 0 refills | Status: DC
Start: 2019-11-07 — End: 2020-01-19

## 2019-11-07 MED ORDER — CIPROFLOXACIN HCL 500 MG PO TABS
500.0000 mg | ORAL_TABLET | Freq: Two times a day (BID) | ORAL | 0 refills | Status: DC
Start: 2019-11-07 — End: 2019-12-16

## 2019-11-07 NOTE — ED Triage Notes (Addendum)
Appt. Pt c/o 10/10 pain in left earx4 days. PT states she woke up today with with 2 bumps on her right wrist

## 2019-11-07 NOTE — ED Provider Notes (Addendum)
[facility]   MRN: [id number] DOB: 1965/06/21  Subjective:   Alyssa Clay is a 54 y.o. female presenting for 4 day hx of acute onset worsening left outer ear pain, tragus tenderness. Patient denies fever, ear drainage, tinnitus, dizziness, trauma. Also has red blister like bump on her right wrist today that is tender.   No current facility-administered medications for this encounter.  Current Outpatient Medications:    albuterol (PROVENTIL) (2.5 MG/3ML) 0.083% nebulizer solution, Take 2.5 mg by nebulization every 6 (six) hours as needed for wheezing or shortness of breath., Disp: , Rfl:    albuterol (VENTOLIN HFA) 108 (90 Base) MCG/ACT inhaler, Inhale 2 puffs into the lungs every 6 (six) hours as needed for wheezing or shortness of breath., Disp: 18 g, Rfl: 2   budesonide-formoterol (SYMBICORT) 160-4.5 MCG/ACT inhaler, Inhale 2 puffs into the lungs 2 (two) times daily., Disp: 1 Inhaler, Rfl: 5   calcium carbonate (OSCAL) 1500 (600 Ca) MG TABS tablet, Take 600 mg of elemental calcium by mouth 2 (two) times daily with a meal., Disp: , Rfl:    Cyanocobalamin (VITAMIN B-12 PO), Take by mouth., Disp: , Rfl:    cyclobenzaprine (FLEXERIL) 10 MG tablet, Take 10 mg by mouth 3 (three) times daily as needed (migraines)., Disp: , Rfl:    DULoxetine (CYMBALTA) 60 MG capsule, Take 1 capsule (60 mg total) by mouth daily., Disp: 90 capsule, Rfl: 1   Erenumab-aooe (AIMOVIG) 140 MG/ML SOAJ, Inject into the skin., Disp: , Rfl:    FASENRA 30 MG/ML SOSY, SECOND SHIP: INJECT ONE SYRINGE UNDER THE SKIN AT WEEK 4 AND 8, THEN EVERY 8 WEEKS THEREAFTER. (Patient taking differently: Inject 30 mg into the skin See admin instructions. Every 8 weeks), Disp: 1 Syringe, Rfl: 8   Ferrous Sulfate (IRON PO), Take by mouth daily., Disp: , Rfl:    gabapentin (NEURONTIN) 300 MG capsule, Take 300-900 mg by mouth See admin instructions. Take 1 tablet every AM, three tablets at bedtime, Disp: , Rfl:     losartan (COZAAR) 50 MG tablet, TAKE 1 TABLET BY MOUTH EVERY DAY (Patient not taking: Reported on 10/21/2019), Disp: 90 tablet, Rfl: 1   montelukast (SINGULAIR) 10 MG tablet, Take 1 tablet (10 mg total) by mouth daily., Disp: 30 tablet, Rfl: 5   Multiple Vitamin (MULTIVITAMIN WITH MINERALS) TABS tablet, Take 1 tablet by mouth daily., Disp: , Rfl:    pantoprazole (PROTONIX) 40 MG tablet, TAKE 1 TABLET BY MOUTH 2 TIMES DAILY BEFORE A MEAL., Disp: 60 tablet, Rfl: 3   predniSONE (DELTASONE) 10 MG tablet, 5 tablets day 1, 4 tablets day 2, 3 tablets day 3, 2 tablets day 4, 1 tablet day 5, then stop, Disp: , Rfl:    Respiratory Therapy Supplies (FLUTTER) DEVI, Use 3 times daily as directed, Disp: 1 each, Rfl: 0   Rimegepant Sulfate (NURTEC) 75 MG TBDP, Take by mouth., Disp: , Rfl:    rizatriptan (MAXALT) 10 MG tablet, TAKE 1 TABLET BY MOUTH ONCE AS NEEDED FOR MIGRAINE FOR UP TO 1 DOSE. MAY REPEAT IN 2 HOURS IF NEEDED, Disp: , Rfl:    tiZANidine (ZANAFLEX) 4 MG tablet, Take 4 mg by mouth every 6 (six) hours as needed for muscle spasms., Disp: , Rfl:    traZODone (DESYREL) 100 MG tablet, TAKE 1-2 TABLETS (100-200 MG TOTAL) BY MOUTH AT BEDTIME AS NEEDED FOR SLEEP., Disp: 180 tablet, Rfl: 2   valACYclovir (VALTREX) 1000 MG tablet, Take 1 tablet (1,000 mg total) by mouth 2 (  two) times daily. (Patient taking differently: Take 1,000 mg by mouth as needed. ), Disp: 18 tablet, Rfl: 3   Vitamin D, Ergocalciferol, (DRISDOL) 1.25 MG (50000 UNIT) CAPS capsule, Take 1 capsule (50,000 Units total) by mouth every 7 (seven) days., Disp: 12 capsule, Rfl: 0   No Known Allergies  Past Medical History:  Diagnosis Date   Asthma    Depression    Diaphragm paralysis    right side, oxygen at night as needed   FUO (fever of unknown origin) 10/21/2019   GERD (gastroesophageal reflux disease)    Hypertension    Migraine      Past Surgical History:  Procedure Laterality Date   BREAST REDUCTION SURGERY  Bilateral 06/12/2019   Procedure: BREAST REDUCTION WITH LIPOSUCTION;  Surgeon: Wallace Going, DO;  Location: [facility];  Service: Plastics;  Laterality: Bilateral;  4 hours, please   [facility] AND CURETTAGE OF UTERUS     multiple due to miscarriages   KNEE ARTHROSCOPY Left 2013   LAPAROSCOPIC APPENDECTOMY N/A 12/08/2018   Procedure: APPENDECTOMY LAPAROSCOPIC;  Surgeon: Donnie Mesa, MD;  Location: [facility];  Service: General;  Laterality: N/A;   TENNIS ELBOW RELEASE/NIRSCHEL PROCEDURE  2009   WISDOM TOOTH EXTRACTION      Family History  Problem Relation Age of Onset   Cancer Mother 75       lymphoma   Depression Mother    Hypertension Father    Heart attack Father 62   Hypertension Brother    High blood pressure Brother    High Cholesterol Brother    Healthy Daughter     Social History   Tobacco Use   Smoking status: Never Smoker   Smokeless tobacco: Never Used  Scientific laboratory technician Use: Never used  Substance Use Topics   Alcohol use: Yes    Comment: occ   Drug use: Never    ROS   Objective:   Vitals: BP (!) 145/66    Pulse (!) 113    Temp 98.2 F (36.8 C) (Oral)    Resp 18    Ht 5' 5.5" (1.664 m)    Wt 275 lb (124.7 kg)    SpO2 96%    BMI 45.07 kg/m   Pulse recheck 103.   Physical Exam Constitutional:      General: She is not in acute distress.    Appearance: Normal appearance. She is well-developed. She is not ill-appearing.  HENT:     Head: Normocephalic and atraumatic.     Right Ear: Tympanic membrane, ear canal and external ear normal. There is no impacted cerumen.     Left Ear: Tympanic membrane and ear canal normal. There is no impacted cerumen.     Ears:     Comments: Exquisite tenderness of left auricle, tragus. Trace swelling and erythema.    Nose: Nose normal.     Mouth/Throat:     Mouth: Mucous membranes are moist.     Pharynx: Oropharynx is clear.  Eyes:     General: No scleral  icterus.       Right eye: No discharge.        Left eye: No discharge.     Extraocular Movements: Extraocular movements intact.     Conjunctiva/sclera: Conjunctivae normal.     Pupils: Pupils are equal, round, and reactive to light.  Cardiovascular:     Rate and Rhythm: Normal rate.  Pulmonary:     Effort:  Pulmonary effort is normal.  Skin:    General: Skin is warm and dry.     Findings: Rash (erythematous lesion of right radial ventral surface with slight tenderness) present.  Neurological:     General: No focal deficit present.     Mental Status: She is alert and oriented to person, place, and time.  Psychiatric:        Mood and Affect: Mood normal.        Behavior: Behavior normal.        Thought Content: Thought content normal.        Judgment: Judgment normal.      Assessment and Plan :   PDMP not reviewed this encounter.  1. Perichondritis of auricle, left   2. Left ear pain   3. Skin lesion     Start ciprofloxacin to cover perichondritis of left auricle, specifically targeting p. aeruginosa. Patient has yeast infections with antibiotics and therefore will use diflucan. Naproxen for pain. Apply mupirocin for suspected superficial skin infection of right wrist. Counseled patient on potential for adverse effects with medications prescribed/recommended today, ER and return-to-clinic precautions discussed, patient verbalized understanding.     Jaynee Eagles, [place] 11/07/19 1530

## 2019-11-08 ENCOUNTER — Other Ambulatory Visit: Payer: Self-pay | Admitting: Family Medicine

## 2019-11-12 ENCOUNTER — Telehealth: Payer: Self-pay

## 2019-11-12 ENCOUNTER — Other Ambulatory Visit: Payer: Self-pay | Admitting: *Deleted

## 2019-11-12 ENCOUNTER — Ambulatory Visit: Payer: 59 | Attending: Internal Medicine

## 2019-11-12 DIAGNOSIS — Z20822 Contact with and (suspected) exposure to covid-19: Secondary | ICD-10-CM

## 2019-11-12 MED ORDER — FLOVENT HFA 110 MCG/ACT IN AERO
2.0000 | INHALATION_SPRAY | Freq: Two times a day (BID) | RESPIRATORY_TRACT | 5 refills | Status: DC
Start: 2019-11-12 — End: 2021-02-28

## 2019-11-12 NOTE — Telephone Encounter (Signed)
Noted.   What was her oxygen level today? If it's still consistently below her normal values then she may need to be evaluated or she can also call her pulmonologist and see what they recommend.   Thank you.

## 2019-11-12 NOTE — Telephone Encounter (Signed)
Patient did get COVID tested this morning. She reports a 95% oxygen level after about an hour of being on her oxygen. She says that her normal is between 94% and 97%. Patient's daughter did call/text her and tell her about a smoke waring from [place] and she wonders if this may be what caused the flare up yesterday while [place] the science center.

## 2019-11-12 NOTE — Telephone Encounter (Signed)
Patient called with complaints of non productive cough and some wheezing x2 days. She used her albuterol inhaler five times yesterday with little to no relief. She used her albuterol one time this morning and had some relief. Patient is using her Symbicort 2 puffs twice daily. She has not added on Asmanex 200 per action plan for her asthma. Patient denies any other symptoms besides a headache. She did use her at home oxygen last night with 2 lpm and her oxygen level was 82% while on oxygen, per patient. Patient does express concerns about COVID due to her going to the gym and not wearing a mask while there. She denies known sick contacts. I spoke with Dr. Maudie Mercury and per her verbal orders patient was told that she could go get COVID tested and was offered a telephone visit today. Patient declined visit today as she said nothing would really be any different than what she had already told me. I also sent in Flovent 110 mcg 2 puffs 2 times daily per Dr. Julianne Rice verbal order. Patient was agreeable with plan and she will call back later today or tomorrow if worsening or no better.

## 2019-11-13 LAB — SARS-COV-2, NAA 2 DAY TAT

## 2019-11-13 LAB — NOVEL CORONAVIRUS, NAA: SARS-CoV-2, NAA: NOT DETECTED

## 2019-11-17 ENCOUNTER — Encounter: Payer: Self-pay | Admitting: Family Medicine

## 2019-11-17 ENCOUNTER — Ambulatory Visit (INDEPENDENT_AMBULATORY_CARE_PROVIDER_SITE_OTHER): Payer: 59 | Admitting: Family Medicine

## 2019-11-17 ENCOUNTER — Other Ambulatory Visit: Payer: Self-pay

## 2019-11-17 VITALS — BP 130/68 | HR 94 | Temp 98.2°F | Wt 273.4 lb

## 2019-11-17 DIAGNOSIS — S8001XA Contusion of right knee, initial encounter: Secondary | ICD-10-CM | POA: Diagnosis not present

## 2019-11-17 DIAGNOSIS — Z7189 Other specified counseling: Secondary | ICD-10-CM

## 2019-11-17 DIAGNOSIS — R600 Localized edema: Secondary | ICD-10-CM | POA: Diagnosis not present

## 2019-11-17 MED ORDER — FUROSEMIDE 20 MG PO TABS
20.0000 mg | ORAL_TABLET | Freq: Every day | ORAL | 0 refills | Status: DC | PRN
Start: 2019-11-17 — End: 2020-01-19

## 2019-11-17 NOTE — Progress Notes (Signed)
° °  Subjective:    Patient ID: Alyssa Clay, female    DOB: May 30, 1965, 54 y.o.   MRN: [id number]  HPI Here for 2 issues. First 3 days ago she tripped over something on the floor and fell, landing on the right knee. This has been painful since then, especially when getting up and down. Walking is not too difficult. Also for the past few weeks she has been swelling in both legs and ankles. No SOB. She tends to swell in hot weather. Her renal function was normal in April.    Review of Systems  Constitutional: Negative.   Respiratory: Negative.   Cardiovascular: Positive for leg swelling. Negative for chest pain and palpitations.  Musculoskeletal: Positive for arthralgias.       Objective:   Physical Exam Constitutional:      Appearance: Normal appearance.  Cardiovascular:     Rate and Rhythm: Normal rate and regular rhythm.     Pulses: Normal pulses.     Heart sounds: Normal heart sounds.  Pulmonary:     Effort: Pulmonary effort is normal.     Breath sounds: Normal breath sounds.  Musculoskeletal:     Comments: 1+ edema in both ankles. The right anterior knee is ecchymotic and tender over the patella. No crepitus, full ROM.   Neurological:     Mental Status: She is alert.           Assessment & Plan:  Knee abrasion, she will apply ice and take Ibuprofen as needed. For the ankle edema she will try Lasix 20 mg as needed.  Alyssa Penna, MD

## 2019-11-21 ENCOUNTER — Ambulatory Visit (HOSPITAL_COMMUNITY): Payer: 59 | Attending: Cardiovascular Disease

## 2019-11-21 ENCOUNTER — Other Ambulatory Visit: Payer: Self-pay

## 2019-11-21 DIAGNOSIS — R509 Fever, unspecified: Secondary | ICD-10-CM | POA: Diagnosis present

## 2019-11-21 LAB — ECHOCARDIOGRAM COMPLETE
Area-P 1/2: 3.71 cm2
S' Lateral: 2.4 cm

## 2019-11-21 MED ORDER — PERFLUTREN LIPID MICROSPHERE
1.0000 mL | INTRAVENOUS | Status: AC | PRN
  Administered 2019-11-21: 2 mL via INTRAVENOUS

## 2019-11-22 ENCOUNTER — Other Ambulatory Visit: Payer: Self-pay | Admitting: Family Medicine

## 2019-11-26 ENCOUNTER — Encounter: Payer: Self-pay | Admitting: Family Medicine

## 2019-12-02 ENCOUNTER — Encounter: Payer: Self-pay | Admitting: Family Medicine

## 2019-12-02 ENCOUNTER — Telehealth (INDEPENDENT_AMBULATORY_CARE_PROVIDER_SITE_OTHER): Payer: 59 | Admitting: Family Medicine

## 2019-12-02 VITALS — Temp 99.9°F

## 2019-12-02 DIAGNOSIS — K30 Functional dyspepsia: Secondary | ICD-10-CM | POA: Diagnosis not present

## 2019-12-02 DIAGNOSIS — R509 Fever, unspecified: Secondary | ICD-10-CM

## 2019-12-02 DIAGNOSIS — R52 Pain, unspecified: Secondary | ICD-10-CM | POA: Diagnosis not present

## 2019-12-02 NOTE — Progress Notes (Addendum)
Virtual Visit via Video Note  I connected with Alyssa Clay  on 12/02/19 at  4:40 PM EDT by a video enabled telemedicine application and verified that I am speaking with the correct person using two identifiers.  Location patient: home, [place] Location provider:work or home office Persons participating in the virtual visit: patient, provider, husband  I discussed the limitations of evaluation and management by telemedicine and the availability of in person appointments. The patient expressed understanding and agreed to proceed.   HPI:  Acute visit for fever: -started yesterday -symptoms: fever to around 100-100.6, upset stomach, body aches - but has been going to the gym a lot, feeling more tired than usual -denies focal or current abd pain, vomiting, diarrhea, malaise, cough, congestion, sore throat, rash, SOB, HA, CP, tick bites, dysuria -has history of lung disease, sees a pulmonologist -son is sick since 3 days ago and husband is sick as well -son was tested for covid, rsv and strep throat and he had RSV - but covid tests are not back yet -vaccinated for COVID19   ROS: See pertinent positives and negatives per HPI.  Past Medical History:  Diagnosis Date  . Asthma   . Depression   . 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 FUO (fever of unknown origin) 10/21/2019  . GERD (gastroesophageal reflux disease)   . Hypertension   . Migraine     Past Surgical History:  Procedure Laterality Date  . BREAST REDUCTION SURGERY Bilateral 06/12/2019   Procedure: BREAST REDUCTION WITH LIPOSUCTION;  Surgeon: Wallace Going, DO;  Location: [facility];  Service: Plastics;  Laterality: Bilateral;  4 hours, please  . CESAREAN SECTION  1994  . DILATION AND CURETTAGE OF UTERUS     multiple due to miscarriages  . KNEE ARTHROSCOPY Left 2013  . LAPAROSCOPIC APPENDECTOMY N/A 12/08/2018   Procedure: APPENDECTOMY LAPAROSCOPIC;  Surgeon: Donnie Mesa, MD;  Location:  [facility];  Service: General;  Laterality: N/A;  . TENNIS ELBOW RELEASE/NIRSCHEL PROCEDURE  2009  . WISDOM TOOTH EXTRACTION      Family History  Problem Relation Age of Onset  . Cancer Mother 36       lymphoma  . Depression Mother   . Hypertension Father   . Heart attack Father 76  . Hypertension Brother   . High blood pressure Brother   . High Cholesterol Brother   . Healthy Daughter     SOCIAL HX: see hpi   Current Outpatient Medications:  .  albuterol (PROVENTIL) (2.5 MG/3ML) 0.083% nebulizer solution, Take 2.5 mg by nebulization every 6 (six) hours as needed for wheezing or shortness of breath., Disp: , Rfl:  .  albuterol (VENTOLIN HFA) 108 (90 Base) MCG/ACT inhaler, Inhale 2 puffs into the lungs every 6 (six) hours as needed for wheezing or shortness of breath., Disp: 18 g, Rfl: 2 .  budesonide-formoterol (SYMBICORT) 160-4.5 MCG/ACT inhaler, Inhale 2 puffs into the lungs 2 (two) times daily., Disp: 1 Inhaler, Rfl: 5 .  calcium carbonate (OSCAL) 1500 (600 Ca) MG TABS tablet, Take 600 mg of elemental calcium by mouth 2 (two) times daily with a meal., Disp: , Rfl:  .  ciprofloxacin (CIPRO) 500 MG tablet, Take 1 tablet (500 mg total) by mouth every 12 (twelve) hours., Disp: 20 tablet, Rfl: 0 .  Cyanocobalamin (VITAMIN B-12 PO), Take by mouth., Disp: , Rfl:  .  cyclobenzaprine (FLEXERIL) 10 MG tablet, Take 10 mg by mouth 3 (three) times  daily as needed (migraines)., Disp: , Rfl:  .  DULoxetine (CYMBALTA) 60 MG capsule, Take 1 capsule (60 mg total) by mouth daily., Disp: 90 capsule, Rfl: 1 .  Erenumab-aooe (AIMOVIG) 140 MG/ML SOAJ, Inject into the skin., Disp: , Rfl:  .  FASENRA 30 MG/ML SOSY, SECOND SHIP: INJECT ONE SYRINGE UNDER THE SKIN AT WEEK 4 AND 8, THEN EVERY 8 WEEKS THEREAFTER. (Patient taking differently: Inject 30 mg into the skin See admin instructions. Every 8 weeks), Disp: 1 Syringe, Rfl: 8 .  Ferrous Sulfate (IRON PO), Take by mouth daily., Disp: , Rfl:  .  fluconazole  (DIFLUCAN) 150 MG tablet, Take 1 tablet (150 mg total) by mouth once a week., Disp: 2 tablet, Rfl: 0 .  fluticasone (FLOVENT HFA) 110 MCG/ACT inhaler, Inhale 2 puffs into the lungs 2 (two) times daily. During respiratory infections/asthma flares., Disp: 1 Inhaler, Rfl: 5 .  furosemide (LASIX) 20 MG tablet, Take 1 tablet (20 mg total) by mouth daily as needed for up to 1 dose for fluid., Disp: 60 tablet, Rfl: 0 .  gabapentin (NEURONTIN) 300 MG capsule, Take 300-900 mg by mouth See admin instructions. Take 1 tablet every AM, three tablets at bedtime, Disp: , Rfl:  .  losartan (COZAAR) 50 MG tablet, TAKE 1 TABLET BY MOUTH EVERY DAY, Disp: 90 tablet, Rfl: 1 .  montelukast (SINGULAIR) 10 MG tablet, Take 1 tablet (10 mg total) by mouth daily., Disp: 30 tablet, Rfl: 5 .  Multiple Vitamin (MULTIVITAMIN WITH MINERALS) TABS tablet, Take 1 tablet by mouth daily., Disp: , Rfl:  .  mupirocin ointment (BACTROBAN) 2 %, Apply 1 application topically 3 (three) times daily., Disp: 30 g, Rfl: 0 .  naproxen (NAPROSYN) 500 MG tablet, Take 1 tablet (500 mg total) by mouth 2 (two) times daily with a meal., Disp: 30 tablet, Rfl: 0 .  pantoprazole (PROTONIX) 40 MG tablet, TAKE 1 TABLET BY MOUTH 2 TIMES DAILY BEFORE A MEAL., Disp: 60 tablet, Rfl: 3 .  predniSONE (DELTASONE) 10 MG tablet, 5 tablets day 1, 4 tablets day 2, 3 tablets day 3, 2 tablets day 4, 1 tablet day 5, then stop, Disp: , Rfl:  .  Respiratory Therapy Supplies (FLUTTER) DEVI, Use 3 times daily as directed, Disp: 1 each, Rfl: 0 .  Rimegepant Sulfate (NURTEC) 75 MG TBDP, Take by mouth., Disp: , Rfl:  .  rizatriptan (MAXALT) 10 MG tablet, TAKE 1 TABLET BY MOUTH ONCE AS NEEDED FOR MIGRAINE FOR UP TO 1 DOSE. MAY REPEAT IN 2 HOURS IF NEEDED, Disp: , Rfl:  .  tiZANidine (ZANAFLEX) 4 MG tablet, Take 4 mg by mouth every 6 (six) hours as needed for muscle spasms., Disp: , Rfl:  .  traZODone (DESYREL) 100 MG tablet, TAKE 1-2 TABLETS (100-200 MG TOTAL) BY MOUTH AT BEDTIME  AS NEEDED FOR SLEEP., Disp: 180 tablet, Rfl: 2 .  valACYclovir (VALTREX) 1000 MG tablet, Take 1 tablet (1,000 mg total) by mouth 2 (two) times daily. (Patient taking differently: Take 1,000 mg by mouth as needed. ), Disp: 18 tablet, Rfl: 3  EXAM:  VITALS per patient if applicable:  GENERAL: alert, oriented, appears well and in no acute distress  HEENT: atraumatic, conjunttiva clear, no obvious abnormalities on inspection of external nose and ears  NECK: normal movements of the head and neck  LUNGS: on inspection no signs of respiratory distress, breathing rate appears normal, no obvious gross SOB, gasping or wheezing  CV: no obvious cyanosis  MS: moves all visible extremities  without noticeable abnormality  PSYCH/NEURO: pleasant and cooperative, no obvious depression or anxiety, speech and thought processing grossly intact  ASSESSMENT AND PLAN:  Discussed the following assessment and plan:  Low grade fever  Body aches  Upset stomach  -we discussed possible serious and likely etiologies, options for evaluation and workup, limitations of telemedicine visit vs in person visit, treatment, treatment risks and precautions. Pt prefers to treat via telemedicine empirically rather then risking or undertaking an in person visit at this moment. Discussed possible viral infection particularly with several family members sick, gastroenteritis vs other. Would be an odd presentation for RSV given no resp symptoms, but advised she check in with pulmonologist or seek care if resp symptoms develop given her history. Advised COVID testing given recent uptik in breakthrough cases with the [place] surge and they reported being in a crowded school open house event recently. Aside form COVID testing, opted for symptomatic OTC analgesic as needed and close monitoring.Advised staying home while sick and per CDC if covid + except to seek medical care.  Advised low theshhold to follow up or seek prompt in person  care if worsening, new symptoms arise, or if is not improving over the next few days.   I discussed the assessment and treatment plan with the patient. The patient was provided an opportunity to ask questions and all were answered. The patient agreed with the plan and demonstrated an understanding of the instructions.   The patient was advised to call back or seek an in-person evaluation if the symptoms worsen or if the condition fails to improve as anticipated.   Lucretia Kern, DO

## 2019-12-03 ENCOUNTER — Telehealth (INDEPENDENT_AMBULATORY_CARE_PROVIDER_SITE_OTHER): Payer: 59 | Admitting: Infectious Disease

## 2019-12-03 ENCOUNTER — Other Ambulatory Visit: Payer: Self-pay

## 2019-12-03 DIAGNOSIS — Z8709 Personal history of other diseases of the respiratory system: Secondary | ICD-10-CM

## 2019-12-03 DIAGNOSIS — R509 Fever, unspecified: Secondary | ICD-10-CM

## 2019-12-03 DIAGNOSIS — K3580 Unspecified acute appendicitis: Secondary | ICD-10-CM

## 2019-12-03 DIAGNOSIS — H66001 Acute suppurative otitis media without spontaneous rupture of ear drum, right ear: Secondary | ICD-10-CM

## 2019-12-03 DIAGNOSIS — J210 Acute bronchiolitis due to respiratory syncytial virus: Secondary | ICD-10-CM

## 2019-12-03 NOTE — Progress Notes (Signed)
Virtual Visit via Telephone Note  I connected with Alyssa Clay on 12/03/19 at  3:00 PM EDT by telephone and verified that I am speaking with the correct person using two identifiers.  Location: Patient: Home Provider: [facility]   I discussed the limitations, risks, security and privacy concerns of performing an evaluation and management service by telephone and the availability of in person appointments. I also discussed with the patient that there may be a patient responsible charge related to this service. The patient expressed understanding and agreed to proceed.   History of Present Illness:  54 year old Caucasian female with past medical history significant for severe migraine headaches, hypertension asthma right-sided diaphragm paralysis, respiratory infections typically every winter.  She was seen by me oin June 29th, 2021 for subjective symptoms of fever that have been going on for more than a year.  She also has objectively measured temperatures that typically can run between 99 and 100.8 typically more so in the afternoon around 2:00 or later.  They are frequently accompanied by feelings of exhaustion.  She has 2 fevers  up to 101.5 and even 102 on several occasions.  The symptoms began nearly a year ago.  They preceded her undergoing an appendectomy and she thought for quite a while that the fevers were related to her appendix.  However after appendectomy her symptoms continued.  She also underwent breast reduction surgery.  She had imaging done in September with a CT angiogram which did not show evidence of pulmonary embolism or any infectious or malignant process in the lungs or thoracic cavity.  CT abdomen pelvis was also benign.  Has been seen by Dr. Gypsy Lore and had a rheumatological work-up.  Patient largely has symptoms of fever exhaustion and malaise.  \t.  She does have severe migraine headaches at times had been on prednisone for that.  She does not think the prednisone  is made much of a difference in her fevers  We did further FUO work-up serologically in our clinic.  She tested negative for viral hepatitis viruses, HIV.  She tested negative for dimorphic fungi including cryptococcal antigen negative and serum histoplasma antigen and Blastomyces antigens negative and urine and a Coccidioides antibody negative and serum.  Her C-reactive protein was slightly elevated sed rate was normal.  Her CPK was normal SPEP and cryoglobulins were normal.  CMV serologies were negative EBV serologies were consistent with past infection with seen bar.  LDH.  Since I saw her she did develop otitis media and was treated with antibiotics for this with resolution of her ear infection.  She more recently in the past week has developed fever and headaches.  Her son who is 45 years old had had a fever severe sore throat and a cough he tested negative for strep throat and negative for COVID-19 but positive for respiratory syncytial virus.  I suspect the patient herself and family members may have RSV.  Her husband is having a cough and congestion.  They will plan on getting tested for COVID-19 later in the week.  He has not had involuntary weight loss associated with these fevers.  It does sound like many of the measured fevers are happening because she is measuring her temperature so I wonder if some of these fevers are more simply typical variation for her.  I think at this point in time I would not embark on further aggressive work-up but would see if she develops any other symptoms.  I encouraged her not to take  her temperature unless she feels poorly.  Review of systems as described above otherwise 12 point review systems negative  Past Medical History:  Diagnosis Date  . Asthma   . Depression   . 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 FUO (fever of unknown origin) 10/21/2019  . GERD (gastroesophageal reflux disease)   . Hypertension   . Migraine      Past Surgical History:  Procedure Laterality Date  . BREAST REDUCTION SURGERY Bilateral 06/12/2019   Procedure: BREAST REDUCTION WITH LIPOSUCTION;  Surgeon: Wallace Going, DO;  Location: [facility];  Service: Plastics;  Laterality: Bilateral;  4 hours, please  . CESAREAN SECTION  1994  . DILATION AND CURETTAGE OF UTERUS     multiple due to miscarriages  . KNEE ARTHROSCOPY Left 2013  . LAPAROSCOPIC APPENDECTOMY N/A 12/08/2018   Procedure: APPENDECTOMY LAPAROSCOPIC;  Surgeon: Donnie Mesa, MD;  Location: [facility];  Service: General;  Laterality: N/A;  . TENNIS ELBOW RELEASE/NIRSCHEL PROCEDURE  2009  . WISDOM TOOTH EXTRACTION      Family History  Problem Relation Age of Onset  . Cancer Mother 23       lymphoma  . Depression Mother   . Hypertension Father   . Heart attack Father 37  . Hypertension Brother   . High blood pressure Brother   . High Cholesterol Brother   . Healthy Daughter       Social History   Socioeconomic History  . Marital status: Married    Spouse name: Not on file  . Number of children: Not on file  . Years of education: Not on file  . Highest education level: Not on file  Occupational History  . Not on file  Tobacco Use  . Smoking status: Never Smoker  . Smokeless tobacco: Never Used  Vaping Use  . Vaping Use: Never used  Substance and Sexual Activity  . Alcohol use: Yes    Comment: occ  . Drug use: Never  . Sexual activity: Not on file  Other Topics Concern  . Not on file  Social History Narrative  . Not on file   Social Determinants of Health   Financial Resource Strain:   . Difficulty of Paying Living Expenses:   Food Insecurity:   . Worried About Charity fundraiser in the Last Year:   . Arboriculturist in the Last Year:   Transportation Needs:   . Film/video editor (Medical):   Marland Kitchen Lack of Transportation (Non-Medical):   Physical Activity:   . Days of Exercise per Week:   . Minutes of Exercise per  Session:   Stress:   . Feeling of Stress :   Social Connections:   . Frequency of Communication with Friends and Family:   . Frequency of Social Gatherings with Friends and Family:   . Attends Religious Services:   . Active Member of Clubs or Organizations:   . Attends Archivist Meetings:   Marland Kitchen Marital Status:     No Known Allergies   Current Outpatient Medications:  .  albuterol (PROVENTIL) (2.5 MG/3ML) 0.083% nebulizer solution, Take 2.5 mg by nebulization every 6 (six) hours as needed for wheezing or shortness of breath., Disp: , Rfl:  .  albuterol (VENTOLIN HFA) 108 (90 Base) MCG/ACT inhaler, Inhale 2 puffs into the lungs every 6 (six) hours as needed for wheezing or shortness of breath., Disp: 18 g, Rfl: 2 .  budesonide-formoterol (SYMBICORT) 160-4.5 MCG/ACT inhaler, Inhale 2 puffs into the lungs 2 (two) times daily., Disp: 1 Inhaler, Rfl: 5 .  calcium carbonate (OSCAL) 1500 (600 Ca) MG TABS tablet, Take 600 mg of elemental calcium by mouth 2 (two) times daily with a meal., Disp: , Rfl:  .  ciprofloxacin (CIPRO) 500 MG tablet, Take 1 tablet (500 mg total) by mouth every 12 (twelve) hours., Disp: 20 tablet, Rfl: 0 .  Cyanocobalamin (VITAMIN B-12 PO), Take by mouth., Disp: , Rfl:  .  cyclobenzaprine (FLEXERIL) 10 MG tablet, Take 10 mg by mouth 3 (three) times daily as needed (migraines)., Disp: , Rfl:  .  DULoxetine (CYMBALTA) 60 MG capsule, Take 1 capsule (60 mg total) by mouth daily., Disp: 90 capsule, Rfl: 1 .  Erenumab-aooe (AIMOVIG) 140 MG/ML SOAJ, Inject into the skin., Disp: , Rfl:  .  FASENRA 30 MG/ML SOSY, SECOND SHIP: INJECT ONE SYRINGE UNDER THE SKIN AT WEEK 4 AND 8, THEN EVERY 8 WEEKS THEREAFTER. (Patient taking differently: Inject 30 mg into the skin See admin instructions. Every 8 weeks), Disp: 1 Syringe, Rfl: 8 .  Ferrous Sulfate (IRON PO), Take by mouth daily., Disp: , Rfl:  .  fluconazole (DIFLUCAN) 150 MG tablet, Take 1 tablet (150 mg total) by mouth once a  week., Disp: 2 tablet, Rfl: 0 .  fluticasone (FLOVENT HFA) 110 MCG/ACT inhaler, Inhale 2 puffs into the lungs 2 (two) times daily. During respiratory infections/asthma flares., Disp: 1 Inhaler, Rfl: 5 .  furosemide (LASIX) 20 MG tablet, Take 1 tablet (20 mg total) by mouth daily as needed for up to 1 dose for fluid., Disp: 60 tablet, Rfl: 0 .  gabapentin (NEURONTIN) 300 MG capsule, Take 300-900 mg by mouth See admin instructions. Take 1 tablet every AM, three tablets at bedtime, Disp: , Rfl:  .  losartan (COZAAR) 50 MG tablet, TAKE 1 TABLET BY MOUTH EVERY DAY, Disp: 90 tablet, Rfl: 1 .  montelukast (SINGULAIR) 10 MG tablet, Take 1 tablet (10 mg total) by mouth daily., Disp: 30 tablet, Rfl: 5 .  Multiple Vitamin (MULTIVITAMIN WITH MINERALS) TABS tablet, Take 1 tablet by mouth daily., Disp: , Rfl:  .  mupirocin ointment (BACTROBAN) 2 %, Apply 1 application topically 3 (three) times daily., Disp: 30 g, Rfl: 0 .  naproxen (NAPROSYN) 500 MG tablet, Take 1 tablet (500 mg total) by mouth 2 (two) times daily with a meal., Disp: 30 tablet, Rfl: 0 .  pantoprazole (PROTONIX) 40 MG tablet, TAKE 1 TABLET BY MOUTH 2 TIMES DAILY BEFORE A MEAL., Disp: 60 tablet, Rfl: 3 .  predniSONE (DELTASONE) 10 MG tablet, 5 tablets day 1, 4 tablets day 2, 3 tablets day 3, 2 tablets day 4, 1 tablet day 5, then stop, Disp: , Rfl:  .  Respiratory Therapy Supplies (FLUTTER) DEVI, Use 3 times daily as directed, Disp: 1 each, Rfl: 0 .  Rimegepant Sulfate (NURTEC) 75 MG TBDP, Take by mouth., Disp: , Rfl:  .  rizatriptan (MAXALT) 10 MG tablet, TAKE 1 TABLET BY MOUTH ONCE AS NEEDED FOR MIGRAINE FOR UP TO 1 DOSE. MAY REPEAT IN 2 HOURS IF NEEDED, Disp: , Rfl:  .  tiZANidine (ZANAFLEX) 4 MG tablet, Take 4 mg by mouth every 6 (six) hours as needed for muscle spasms., Disp: , Rfl:  .  traZODone (DESYREL) 100 MG tablet, TAKE 1-2 TABLETS (100-200 MG TOTAL) BY MOUTH AT BEDTIME AS NEEDED FOR SLEEP., Disp: 180 tablet, Rfl: 2 .  valACYclovir  (VALTREX) 1000 MG tablet,  Take 1 tablet (1,000 mg total) by mouth 2 (two) times daily. (Patient taking differently: Take 1,000 mg by mouth as needed. ), Disp: 18 tablet, Rfl: 3    Observations/Objective:  [place] appeared relatively well despite likely suffering from RSV infection.  Assessment and Plan:  Fever of unknown origin: Work-up has been negative so far.  There have only been occasional slightly abnormal labs such as elevated sed rate very month many months ago and a slightly elevated CRP recently.  Do not have suspicion for infectious etiology of her fevers at this point in time.  Is possible she could have a rheumatological disorder though it is not clear that she does have 1.  I think for the time being I would have her not check temperatures unless she feels poorly and continue to see how she does.  She sounds like she is getting more physically active and working out in the gym which I think is good for her as well.  She will follow-up with [place] as needed  Not available I think actually seeing a different one of my partners or nurse practitioner would be helpful to have her seen by a fresh set of eyes  Follow Up Instructions:    I discussed the assessment and treatment plan with the patient. The patient was provided an opportunity to ask questions and all were answered. The patient agreed with the plan and demonstrated an understanding of the instructions.   The patient was advised to call back or seek an in-person evaluation if the symptoms worsen or if the condition fails to improve as anticipated.  I provided 21 minutes of non-face-to-face time during this encounter.   Alcide Evener, MD

## 2019-12-07 ENCOUNTER — Encounter: Payer: Self-pay | Admitting: Family Medicine

## 2019-12-13 ENCOUNTER — Other Ambulatory Visit: Payer: Self-pay | Admitting: Family Medicine

## 2019-12-16 ENCOUNTER — Other Ambulatory Visit: Payer: Self-pay

## 2019-12-16 ENCOUNTER — Other Ambulatory Visit: Payer: 59

## 2019-12-16 ENCOUNTER — Telehealth (INDEPENDENT_AMBULATORY_CARE_PROVIDER_SITE_OTHER): Payer: 59 | Admitting: Family Medicine

## 2019-12-16 ENCOUNTER — Telehealth: Payer: Self-pay | Admitting: Family Medicine

## 2019-12-16 ENCOUNTER — Ambulatory Visit: Payer: 59 | Admitting: Gastroenterology

## 2019-12-16 ENCOUNTER — Encounter: Payer: Self-pay | Admitting: Family Medicine

## 2019-12-16 DIAGNOSIS — Z20822 Contact with and (suspected) exposure to covid-19: Secondary | ICD-10-CM

## 2019-12-16 DIAGNOSIS — J4550 Severe persistent asthma, uncomplicated: Secondary | ICD-10-CM

## 2019-12-16 DIAGNOSIS — R059 Cough, unspecified: Secondary | ICD-10-CM

## 2019-12-16 DIAGNOSIS — R05 Cough: Secondary | ICD-10-CM | POA: Diagnosis not present

## 2019-12-16 DIAGNOSIS — J029 Acute pharyngitis, unspecified: Secondary | ICD-10-CM

## 2019-12-16 DIAGNOSIS — R0981 Nasal congestion: Secondary | ICD-10-CM | POA: Diagnosis not present

## 2019-12-16 MED ORDER — BENZONATATE 100 MG PO CAPS: 100.0000 mg | ORAL_CAPSULE | Freq: Three times a day (TID) | ORAL | 0 refills | Status: DC | PRN

## 2019-12-16 NOTE — Telephone Encounter (Signed)
Son had RSV 3 weeks ago and now has the same symptoms.  She has a bad sore throat and is wondering if RSV could be passed to her.

## 2019-12-16 NOTE — Progress Notes (Addendum)
Virtual Visit via Video Note  I connected with Alyssa Clay  on 12/16/19 at 11:20 AM EDT by a video enabled telemedicine application and verified that I am speaking with the correct person using two identifiers.  Location patient: home Location provider:work or home office Persons participating in the virtual visit: patient, provider, husband  I discussed the limitations of evaluation and management by telemedicine and the availability of in person appointments. The patient expressed understanding and agreed to proceed.   HPI:  Acute visit for a cough and congestion: -this started Tuesday -symptoms include sore throat, drainage, ears feel full, low grade temp 99-100.1, cough, headache, body aches -denies SOB (other than occ when active which she has some of at baseline), vomiting, diarrhea, CP -O2 95 on her apple watch - she reports this is good for her -is set up to get a COVID test later today -no known sick contacts -fully vaccinated for [place] -she goes to the gym without a mask mandate -she is using her alb as needed - but has not needed more than usual -has hx of asthma and diaphragm paralysis - sees pulm for managment   ROS: See pertinent positives and negatives per HPI.  Past Medical History:  Diagnosis Date  . Asthma   . Depression   . 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 FUO (fever of unknown origin) 10/21/2019  . GERD (gastroesophageal reflux disease)   . Hypertension   . Migraine     Past Surgical History:  Procedure Laterality Date  . BREAST REDUCTION SURGERY Bilateral 06/12/2019   Procedure: BREAST REDUCTION WITH LIPOSUCTION;  Surgeon: Wallace Going, DO;  Location: [facility];  Service: Plastics;  Laterality: Bilateral;  4 hours, please  . CESAREAN SECTION  1994  . DILATION AND CURETTAGE OF UTERUS     multiple due to miscarriages  . KNEE ARTHROSCOPY Left 2013  . LAPAROSCOPIC APPENDECTOMY N/A 12/08/2018   Procedure:  APPENDECTOMY LAPAROSCOPIC;  Surgeon: Donnie Mesa, MD;  Location: [facility];  Service: General;  Laterality: N/A;  . TENNIS ELBOW RELEASE/NIRSCHEL PROCEDURE  2009  . WISDOM TOOTH EXTRACTION      Family History  Problem Relation Age of Onset  . Depression Mother   . Lymphoma Mother 42  . Hypertension Father   . Heart attack Father 67  . Hypertension Brother   . High blood pressure Brother   . High Cholesterol Brother   . Healthy Daughter   . Colon cancer Other        aunt   . Esophageal cancer Neg Hx   . Pancreatic cancer Neg Hx   . Stomach cancer Neg Hx     SOCIAL HX: see hpi   Current Outpatient Medications:  .  albuterol (PROVENTIL) (2.5 MG/3ML) 0.083% nebulizer solution, Take 2.5 mg by nebulization every 6 (six) hours as needed for wheezing or shortness of breath., Disp: , Rfl:  .  albuterol (VENTOLIN HFA) 108 (90 Base) MCG/ACT inhaler, Inhale 2 puffs into the lungs every 6 (six) hours as needed for wheezing or shortness of breath., Disp: 18 g, Rfl: 2 .  budesonide-formoterol (SYMBICORT) 160-4.5 MCG/ACT inhaler, Inhale 2 puffs into the lungs 2 (two) times daily., Disp: 1 Inhaler, Rfl: 5 .  calcium carbonate (OSCAL) 1500 (600 Ca) MG TABS tablet, Take 600 mg of elemental calcium by mouth 2 (two) times daily with a meal., Disp: , Rfl:  .  Cyanocobalamin (VITAMIN B-12 PO), Take by mouth., Disp: ,  Rfl:  .  cyclobenzaprine (FLEXERIL) 10 MG tablet, Take 10 mg by mouth 3 (three) times daily as needed (migraines)., Disp: , Rfl:  .  DULoxetine (CYMBALTA) 60 MG capsule, Take 1 capsule (60 mg total) by mouth daily., Disp: 90 capsule, Rfl: 1 .  Erenumab-aooe (AIMOVIG) 140 MG/ML SOAJ, Inject into the skin., Disp: , Rfl:  .  FASENRA 30 MG/ML SOSY, SECOND SHIP: INJECT ONE SYRINGE UNDER THE SKIN AT WEEK 4 AND 8, THEN EVERY 8 WEEKS THEREAFTER. (Patient taking differently: Inject 30 mg into the skin See admin instructions. Every 8 weeks), Disp: 1 Syringe, Rfl: 8 .  Ferrous Sulfate (IRON PO), Take by  mouth daily., Disp: , Rfl:  .  fluconazole (DIFLUCAN) 150 MG tablet, Take 1 tablet (150 mg total) by mouth once a week., Disp: 2 tablet, Rfl: 0 .  fluticasone (FLOVENT HFA) 110 MCG/ACT inhaler, Inhale 2 puffs into the lungs 2 (two) times daily. During respiratory infections/asthma flares., Disp: 1 Inhaler, Rfl: 5 .  furosemide (LASIX) 20 MG tablet, Take 1 tablet (20 mg total) by mouth daily as needed for up to 1 dose for fluid., Disp: 60 tablet, Rfl: 0 .  gabapentin (NEURONTIN) 300 MG capsule, Take 300-900 mg by mouth See admin instructions. Take 1 tablet every AM, three tablets at bedtime, Disp: , Rfl:  .  losartan (COZAAR) 50 MG tablet, TAKE 1 TABLET BY MOUTH EVERY DAY, Disp: 90 tablet, Rfl: 1 .  montelukast (SINGULAIR) 10 MG tablet, Take 1 tablet (10 mg total) by mouth daily., Disp: 30 tablet, Rfl: 5 .  Multiple Vitamin (MULTIVITAMIN WITH MINERALS) TABS tablet, Take 1 tablet by mouth daily., Disp: , Rfl:  .  mupirocin ointment (BACTROBAN) 2 %, Apply 1 application topically 3 (three) times daily., Disp: 30 g, Rfl: 0 .  naproxen (NAPROSYN) 500 MG tablet, Take 1 tablet (500 mg total) by mouth 2 (two) times daily with a meal., Disp: 30 tablet, Rfl: 0 .  pantoprazole (PROTONIX) 40 MG tablet, TAKE 1 TABLET BY MOUTH 2 TIMES DAILY BEFORE A MEAL., Disp: 60 tablet, Rfl: 3 .  Respiratory Therapy Supplies (FLUTTER) DEVI, Use 3 times daily as directed, Disp: 1 each, Rfl: 0 .  Rimegepant Sulfate (NURTEC) 75 MG TBDP, Take by mouth., Disp: , Rfl:  .  rizatriptan (MAXALT) 10 MG tablet, TAKE 1 TABLET BY MOUTH ONCE AS NEEDED FOR MIGRAINE FOR UP TO 1 DOSE. MAY REPEAT IN 2 HOURS IF NEEDED, Disp: , Rfl:  .  tiZANidine (ZANAFLEX) 4 MG tablet, Take 4 mg by mouth every 6 (six) hours as needed for muscle spasms., Disp: , Rfl:  .  traZODone (DESYREL) 100 MG tablet, TAKE 1-2 TABLETS (100-200 MG TOTAL) BY MOUTH AT BEDTIME AS NEEDED FOR SLEEP., Disp: 180 tablet, Rfl: 2 .  valACYclovir (VALTREX) 1000 MG tablet, Take 1 tablet  (1,000 mg total) by mouth 2 (two) times daily. (Patient taking differently: Take 1,000 mg by mouth as needed. ), Disp: 18 tablet, Rfl: 3 .  benzonatate (TESSALON PERLES) 100 MG capsule, Take 1 capsule (100 mg total) by mouth 3 (three) times daily as needed., Disp: 20 capsule, Rfl: 0  EXAM:  VITALS per patient if applicable:  GENERAL: alert, oriented, appears well and in no acute distress  HEENT: atraumatic, conjunttiva clear, no obvious abnormalities on inspection of external nose and ears  NECK: normal movements of the head and neck  LUNGS: on inspection no signs of respiratory distress, breathing rate appears normal, no obvious gross SOB, gasping or wheezing,  coughs a few times during visit  CV: no obvious cyanosis  MS: moves all visible extremities without noticeable abnormality  PSYCH/NEURO: pleasant and cooperative, no obvious depression or anxiety, speech and thought processing grossly intact  ASSESSMENT AND PLAN:  Discussed the following assessment and plan:  Nasal congestion  Cough  Sore throat  Severe persistent asthma, unspecified whether complicated  -we discussed possible serious and likely etiologies, options for evaluation and workup, limitations of telemedicine visit vs in person visit, treatment, treatment risks and precautions. Pt prefers to treat via telemedicine empirically rather then risking or undertaking an in person visit at this moment. Given symptoms clear up after last bug a few weeks ago, suspect this is likely a new upper resp illness. She has covid recheck test pending. Likely VURI. Discussed symptomatic care with nasal saline, short 3 day course of Afrin, gargles and analgesic for sore throat if needed. Sent Tessalon rx for cough. Advised low threshhold for follow up with pulm or PCP if breathing issues, covid test +, worsening or any concerns given underlying lung disease. Reviewed current preventive and prn asthma medications. She feels is not  currently requiring increased prn medication and symptoms are responsive. She also is monitoring her O2 and feels this has been good. Advised if running < than 95 to contact her pulmonologist for advice. Provided COVID [facility] information in case of positive COVID result given several risks factors for severe disease, but this seems less likely given vaccinated and she and husband had neg rapid tests.  Work/School slipped offered:declined Advised to seek prompt follow up telemedicine visit or in person care if worsening, new symptoms arise, or if is not improving with treatment.  Did let her know that I only do telemedicine on Tuesdays and Thursdays for [facility] and advised follow up visit with PCP or [facility] if needs follow up or if any further questions arise to avoid any delays.   I discussed the assessment and treatment plan with the patient. The patient was provided an opportunity to ask questions and all were answered. The patient agreed with the plan and demonstrated an understanding of the instructions.   The patient was advised to call back or seek an in-person evaluation if the symptoms worsen or if the condition fails to improve as anticipated.   Lucretia Kern, DO

## 2019-12-16 NOTE — Telephone Encounter (Signed)
Spoke with the pt and scheduled a virtual visit for today with Dr Maudie Mercury.

## 2019-12-17 LAB — NOVEL CORONAVIRUS, NAA: SARS-CoV-2, NAA: NOT DETECTED

## 2019-12-17 LAB — SPECIMEN STATUS REPORT

## 2019-12-17 LAB — SARS-COV-2, NAA 2 DAY TAT

## 2019-12-18 ENCOUNTER — Encounter: Payer: Self-pay | Admitting: Pulmonary Disease

## 2019-12-18 ENCOUNTER — Telehealth: Payer: Self-pay | Admitting: Pulmonary Disease

## 2019-12-18 ENCOUNTER — Encounter: Payer: Self-pay | Admitting: Family Medicine

## 2019-12-18 ENCOUNTER — Telehealth (INDEPENDENT_AMBULATORY_CARE_PROVIDER_SITE_OTHER): Payer: 59 | Admitting: Family Medicine

## 2019-12-18 ENCOUNTER — Ambulatory Visit (INDEPENDENT_AMBULATORY_CARE_PROVIDER_SITE_OTHER): Payer: 59

## 2019-12-18 ENCOUNTER — Ambulatory Visit (INDEPENDENT_AMBULATORY_CARE_PROVIDER_SITE_OTHER): Payer: 59 | Admitting: Pulmonary Disease

## 2019-12-18 ENCOUNTER — Other Ambulatory Visit: Payer: Self-pay

## 2019-12-18 VITALS — BP 116/70 | HR 103 | Ht 65.5 in | Wt 271.4 lb

## 2019-12-18 DIAGNOSIS — R05 Cough: Secondary | ICD-10-CM | POA: Diagnosis not present

## 2019-12-18 DIAGNOSIS — J986 Disorders of diaphragm: Secondary | ICD-10-CM | POA: Diagnosis not present

## 2019-12-18 DIAGNOSIS — J45901 Unspecified asthma with (acute) exacerbation: Secondary | ICD-10-CM

## 2019-12-18 DIAGNOSIS — G4733 Obstructive sleep apnea (adult) (pediatric): Secondary | ICD-10-CM

## 2019-12-18 DIAGNOSIS — J4550 Severe persistent asthma, uncomplicated: Secondary | ICD-10-CM

## 2019-12-18 DIAGNOSIS — J989 Respiratory disorder, unspecified: Secondary | ICD-10-CM | POA: Diagnosis not present

## 2019-12-18 DIAGNOSIS — R059 Cough, unspecified: Secondary | ICD-10-CM

## 2019-12-18 DIAGNOSIS — J310 Chronic rhinitis: Secondary | ICD-10-CM

## 2019-12-18 DIAGNOSIS — J069 Acute upper respiratory infection, unspecified: Secondary | ICD-10-CM

## 2019-12-18 IMAGING — DX DG CHEST 2V
2 series · 2 of 2 positions shown · non-contrast
Comparison: Chest x-ray [DATE].

CLINICAL DATA: 53-year-old female with history of cough.

EXAM:
CHEST - 2 VIEW

[chest pa]
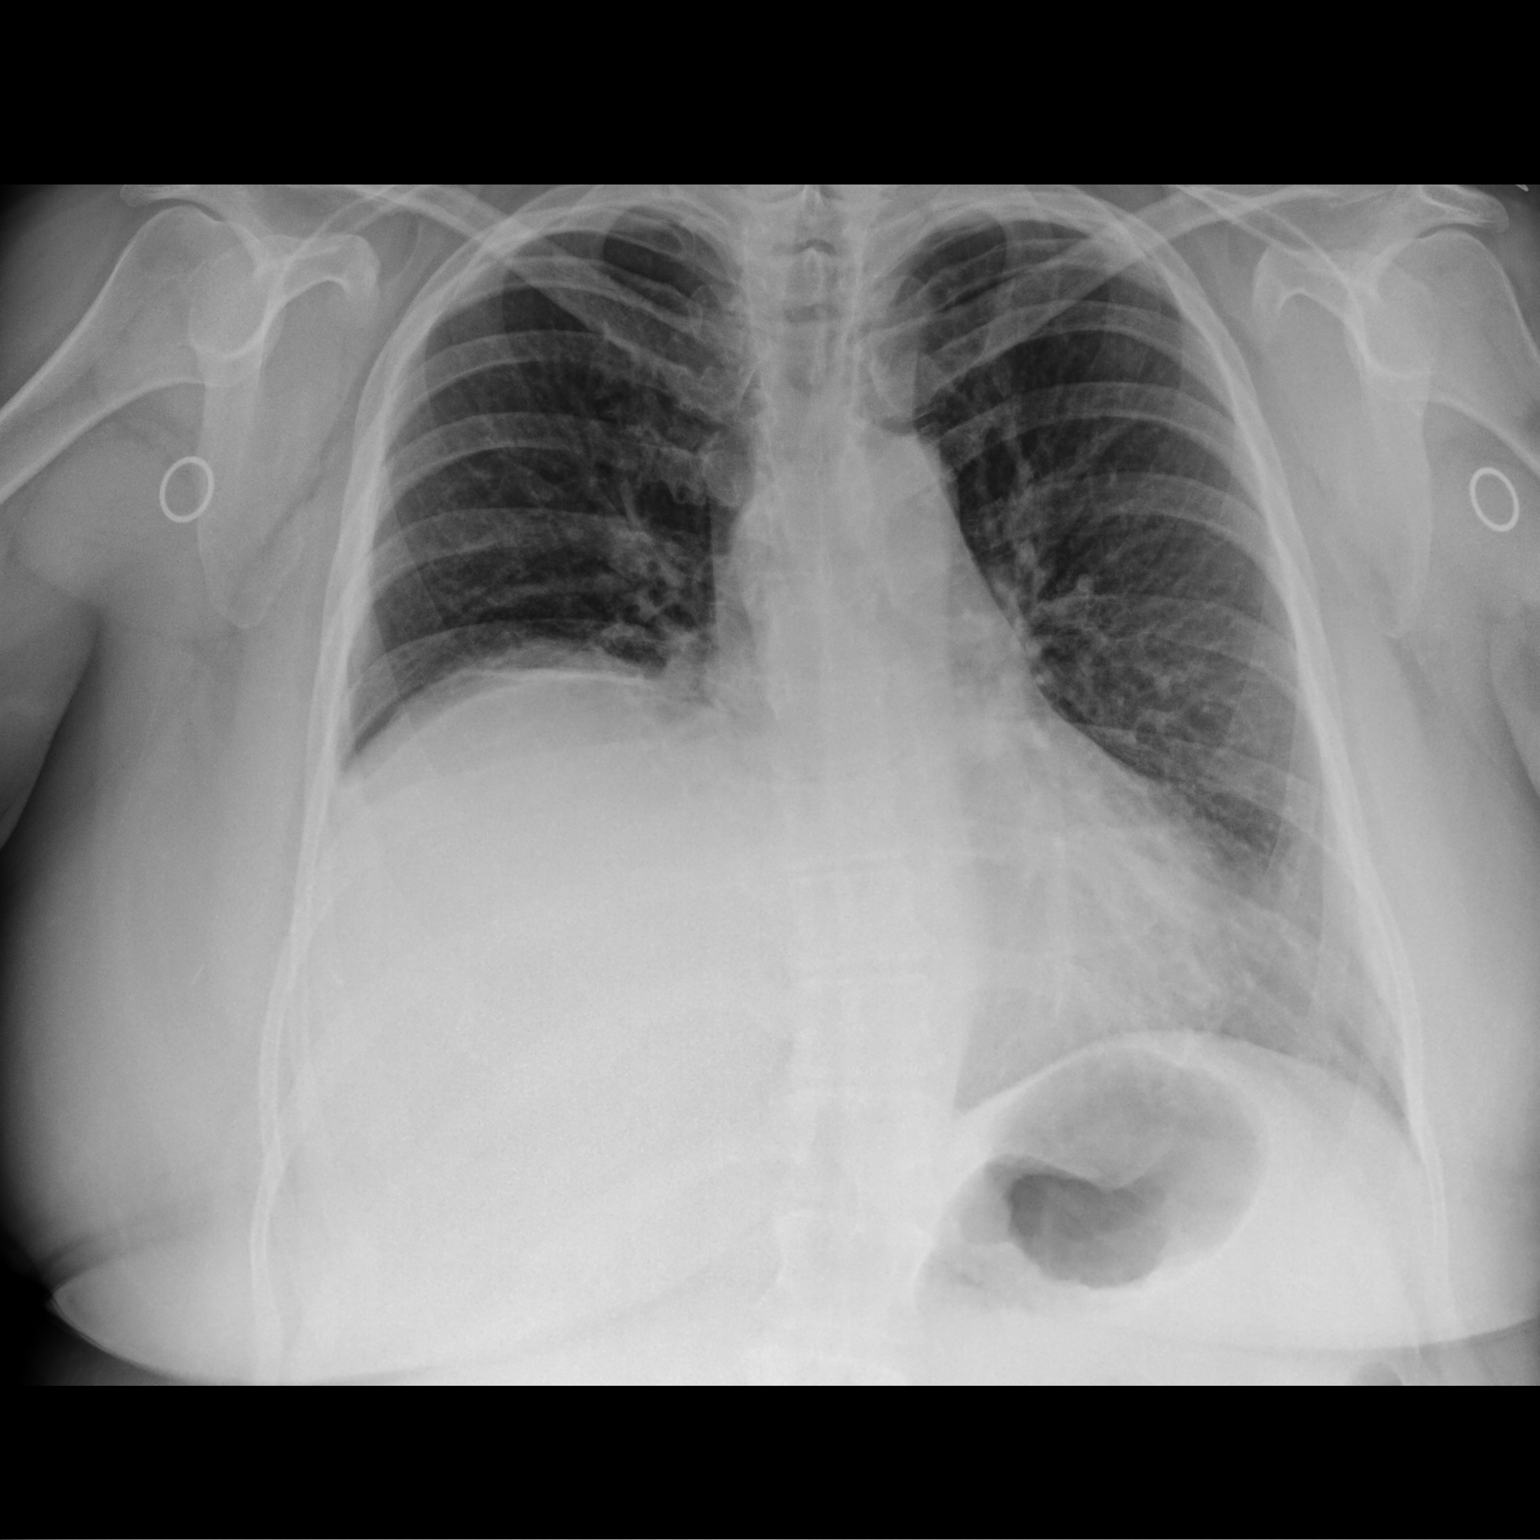

[chest lat]
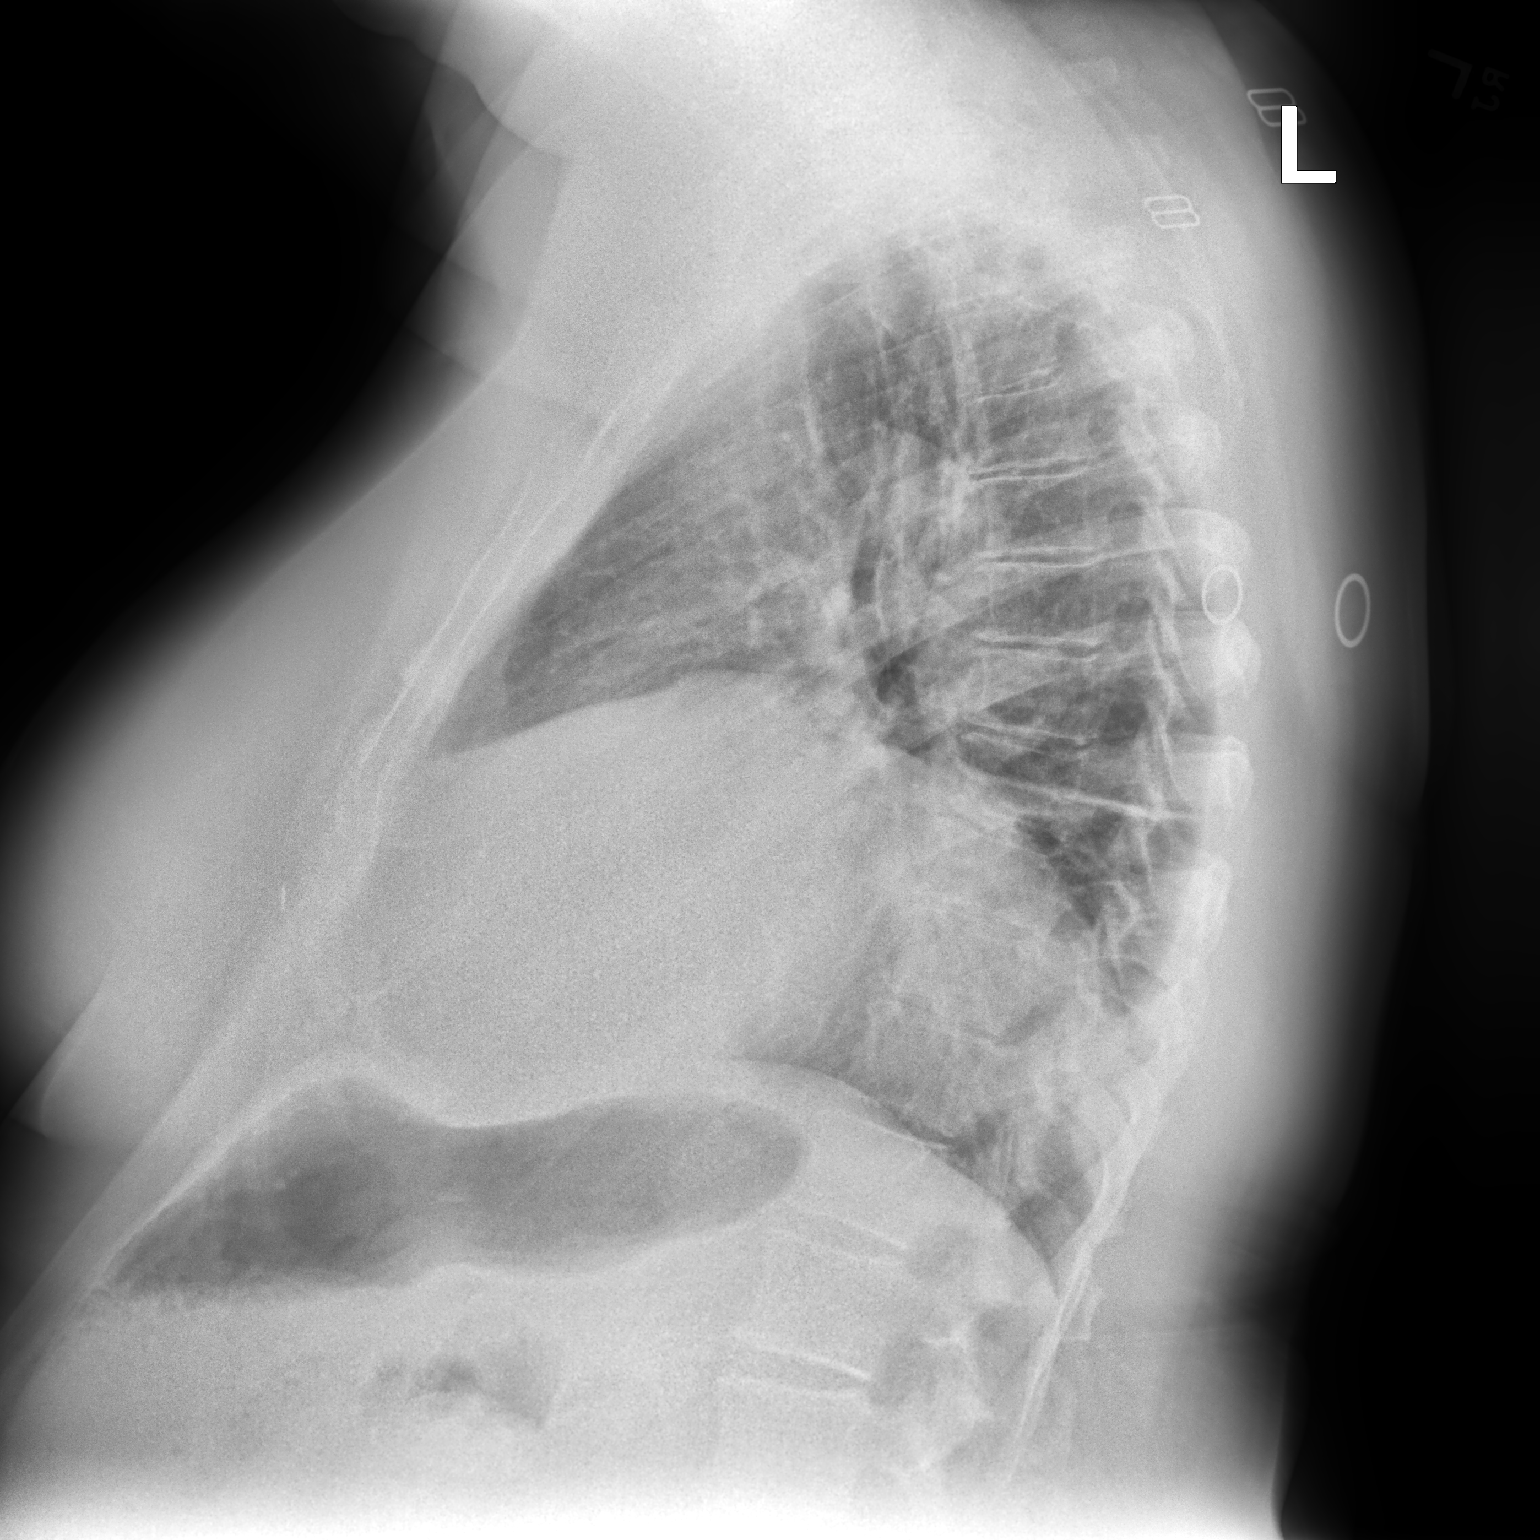

[2 of 2 positions shown; findings below may reference images not displayed]

FINDINGS: Chronic elevation of the right hemidiaphragm. Lung volumes are
normal. No consolidative airspace disease. No pleural effusions. No
pneumothorax. No pulmonary nodule or mass noted. Pulmonary
vasculature and the cardiomediastinal silhouette are within normal
limits.
IMPRESSION: 1. No radiographic evidence of acute cardiopulmonary disease.  Whole
2. Severe chronic elevation of the right hemidiaphragm, similar to
prior examination.

## 2019-12-18 MED ORDER — DOXYCYCLINE HYCLATE 100 MG PO TABS: 100.0000 mg | ORAL_TABLET | Freq: Two times a day (BID) | ORAL | 0 refills | Status: DC

## 2019-12-18 MED ORDER — PREDNISONE 20 MG PO TABS
40.0000 mg | ORAL_TABLET | Freq: Every day | ORAL | 0 refills | Status: DC
Start: 2019-12-18 — End: 2020-01-13

## 2019-12-18 NOTE — Telephone Encounter (Signed)
Called and spoke to pt and pt's husband. Pt c/o a slight Increase in SOB, prod cough with green mucus, voice hoarseness. Pt states she was put on abx by her PCP but was advised to follow up with pulmonary to script something for her cough. Pt persistent  to have an in office visit and did not want phone medicine. Pt has been tested negative for COVID on 12/16/19. Appt scheduled to see Wyn Quaker, NP, 12/18/19.   Will forward to Wyn Quaker, NP, as Juluis Rainier.

## 2019-12-18 NOTE — Assessment & Plan Note (Signed)
Known history of mild obstructive sleep apnea Currently using nocturnal oxygen and not using CPAP Patient currently uses nocturnal oxygen when she "feels like it" Patient reports that she is monitoring her oxygen levels at night via her apple watch Previous referrals to sleep medicine, most recent appointment canceled due to patient becoming ill  Plan: Would encourage patient to meet with sleep MD to discuss treatment options for mild obstructive sleep apnea especially given nocturnal hypoxemia

## 2019-12-18 NOTE — Progress Notes (Addendum)
@Patient  ID: Alyssa Clay, female    DOB: 10/04/1965, 54 y.o.   MRN: [id number]  Chief Complaint  Patient presents with  . Acute Visit    Pt is being seen due to having a persistent cough.  Pt states she has had the cough x4 days. Pt is now coughing up thick yellow phlegm. Pt also has hoarseness.    Referring provider: Caren Macadam, MD  HPI:  54 year old female never smoker found our office for severe persistent asthma  PMH: GERD, hypertension Smoker/ Smoking History: Never smoker Maintenance: Symbicort 160, Fasenra Pt of: Dr. Vaughan Browner  12/18/2019  - Visit   54 year old female never smoker followed in our office for shortness of breath and severe persistent asthma.  Patient is followed in our office by Dr. Vaughan Browner.  Patient was last seen in our office in November/2020 at that office visit was recommended the patient continue not sternal oxygen, work on weight loss, continue Symbicort 160 and Fasenra as managed by allergy asthma.  Patient has obtained her Covid vaccines.  She contacted our office on 12/18/2019 reporting increased shortness of breath as well as productive cough with green mucus as well as voice hoarseness.  She is currently on antibiotics based off her PCP.  She has obtained a negative Covid test that is visible in the chart on 12/16/2019.  Patient presenting to office today as a acute add-on visit for the morning.  She is already completed a virtual visit with primary care earlier today.  They have recommended starting doxycycline as well as prednisone.  She has yet to start those medications yet.  She is scheduled an appointment with our office for additional evaluation.  She reports that she wants to "feel better".  Known sick contacts to spouse who had a viral upper respiratory infection last week.  Patient reports that she is attempted to use NyQuil but she felt like this made her cough more productive and made her "worse".  Patient Allayne Stack is also recently seen  infectious disease Dr. Drucilla Schmidt for fever of unknown origin on 12/03/2019.  That was a virtual visit.  Assessment and plan from that office visit is listed below:  Assessment and Plan:  Fever of unknown origin: Work-up has been negative so far.  There have only been occasional slightly abnormal labs such as elevated sed rate very month many months ago and a slightly elevated CRP recently.  Do not have suspicion for infectious etiology of her fevers at this point in time.  Is possible she could have a rheumatological disorder though it is not clear that she does have 1.  I think for the time being I would have her not check temperatures unless she feels poorly and continue to see how she does.  She sounds like she is getting more physically active and working out in the gym which I think is good for her as well.  She will follow-up with [place] as needed  Patient reports that her allergist is Dr. Maudie Mercury.  She reports that she has not seen the allergy asthma team since June/2021.  Office visit from that last follow-up is listed below:  Assessment and Plan: Vanette is a 54 y.o. female with: Severe persistent asthma Past history - Spiriva caused coughing. Normal alpha-1 level.  Interim history - stable. Saw pulmonology.   ACT score 23.   Today's spirometry showed severe restriction.   Daily controller medication(s):continue Symbicort 160 2 puffs twice a day with spacer and rinse mouth afterwards. ?  Continue fasenra injections - will be doing them at home.   Prior to physical activity:May use albuterol rescue inhaler 2 puffs 5 to 15 minutes prior to strenuous physical activities.  Rescue medications:May use albuterol rescue inhaler 2 puffs or nebulizer every 4 to 6 hours as needed for shortness of breath, chest tightness, coughing, and wheezing. Monitor frequency of use.   During upper respiratory infections/asthma flares: Start Asmanex 200 1 puff twice a day with spacer and rinse mouth  afterwards for 1 week.  Repeat spirometry at next visit.   Atelectasis Past history - 9/9/202 CT chest showed: 1. Prominent elevation of the right hemidiaphragm with associated right lung base atelectasis including complete right middle lobe atelectasis. Right hemidiaphragm paralysis not excluded. Interim history - saw pulmonology. Using oxygen 2L at night at times.   Monitor symptoms and pulse ox.  Use oxygen as needed.  Encouraged using flutter valve and incentive spirometer.   History of frequent upper respiratory infection Past history - History of frequent bronchitis in her lifetime. Interim history - normal basic immune evaluation. No antibiotics for URI's since last visit. Noticed some mild fevers on and off with no triggers. Saw rheumatology.   Keep track of infections/bronchitis.  Recommend ID referral if fevers do not resolve.   Chronic rhinitis Past history - All seasonal and perennial aeroallergen skin tests are negative despite a positive histamine control.  Tried Flonase, Xhance, Astelin, Xyzal, Periactin, and Claritin without adequate symptom relief.  Interm history - asymptomatic.   May use Flonase + Astelin 1 spray twice a day as needed.  Nasal saline spray (i.e., Simply Saline) or nasal saline lavage (i.e., NeilMed) is recommended as needed and prior to medicated nasal sprays.  Gastroesophageal reflux disease Controlled with Protonix.   Continue pantoprazole (Protonix) as prescribed.  Continue reflux lifestyle modifications and diet.   Return in about 5 months (around 03/14/2020).  Patient reports that she recently had antibiotics sometime over the last few months due to an ear infection.  She is unsure which antibiotic she was treated with.  She has not had any recent chest imaging.  She remains adherent to Symbicort 160 as well as Fasenra.  She is due for follow-up with Dr. Vaughan Browner within our office.  Questionaires / Pulmonary Flowsheets:   ACT:    Asthma Control Test ACT Total Score  10/13/2019 23  03/04/2019 17  01/15/2019 20    MMRC: No flowsheet data found.  Epworth:  No flowsheet data found.  Tests:   12/16/2019-SARS-CoV-2-not detected patient presenting to office today after completing spirometry with DLCO.  Patient reports that  Imaging: CT high-resolution 01/01/2019- prominent elevation of right hemidiaphragm with right base atelectasis.  No interstitial lung disease.  Nonspecific scarring at the lung base.  Small hiatal hernia CTA 02/01/2019 no pulmonary embolism.  Subsegmental atelectasis.  Right hemidiaphragm elevation.  PFTs: 01/15/2019 FVC 1.83 [43%], FEV1 1.42 [43%], F/F 78 Likely severe restriction  03/03/2019 FVC 1.79 [46%], FEV1 1.56 [51%], F/F 87, TLC 3.46 [63%], DLCO 19.02 [83%]. Moderate restriction  6-minute walk 01/02/2019-O2 sats remained 96 to 100%, completed 320 m  Labs: Alpha-1 antitrypsin 01/15/2019-158  CBC 06/13/2018-WBC 8.7, eos 2%, absolute eosinophil count 174 CBC 01/06/2019-WBC 5.1, eos 0% [on Fasenra]  IgE 06/21/2018-16  Asthma score ACT score 01/22/2019-20 Act score 03/04/2019-17  Sleep Home PSG 02/01/2019 Mild sleep apnea, AHI 9.7 Severe oxygen desaturation to 72%  FENO:  No results found for: NITRICOXIDE  PFT: PFT Results Latest Ref Rng & Units 03/03/2019  FVC-Pre L 1.85  FVC-Predicted Pre % 48  FVC-Post L 1.79  FVC-Predicted Post % 46  Pre FEV1/FVC % % 85  Post FEV1/FCV % % 87  FEV1-Pre L 1.57  FEV1-Predicted Pre % 51  FEV1-Post L 1.56  DLCO uncorrected ml/min/mmHg 19.02  DLCO UNC% % 83  DLVA Predicted % 145  TLC L 3.46  TLC % Predicted % 63  RV % Predicted % 73    WALK:  SIX MIN WALK 03/04/2019 01/22/2019  Medications None -  Supplimental Oxygen during Test? (L/min) No No  Laps 9 -  Partial Lap (in Meters) 14 -  Baseline BP (sitting) 110/64 -  Baseline Heartrate 93 -  Baseline Dyspnea (Borg Scale) 1 -  Baseline Fatigue (Borg Scale) 3 -  Baseline SPO2 96 -   BP (sitting) 130/72 -  Heartrate 117 -  Dyspnea (Borg Scale) 5 -  Fatigue (Borg Scale) 3 -  SPO2 100 -  BP (sitting) 112/70 -  Heartrate 95 -  SPO2 99 -  Stopped or Paused before Six Minutes No -  Distance Completed 320 -  Tech Comments: Desmond Dike, CMA pt. walked at steady pace. she could only do 1 lap due to becoming dizzy after first lap. ER    Imaging: ECHOCARDIOGRAM COMPLETE  Result Date: 11/21/2019    ECHOCARDIOGRAM REPORT   Patient Name:   MELAYSIA STREED Date of Exam: 11/21/2019 Medical Rec #:  [id number]        Height:       65.5 in Accession #:    [id number]       Weight:       273.4 lb Date of Birth:  04/20/1966        BSA:          2.272 m Patient Age:    36 years         BP:           130/68 mmHg Patient Gender: F                HR:           107 bpm. Exam Location:  [facility] Procedure: 2D Echo, Cardiac Doppler, Color Doppler and Intracardiac            Opacification Agent Indications:    R50.9 Fever  History:        Patient has no prior history of Echocardiogram examinations.                 Signs/Symptoms:Shortness of Breath; Risk Factors:Hypertension.                 Asthma. Back apin. Neck pain. S/P bilateral breast reduction.                 Obesity.  Sonographer:    Diamond Nickel RCS Referring Phys: [id number] CORNELIUS N VAN DAM  Sonographer Comments: Image acquisition challenging due to patient body habitus. IMPRESSIONS  1. Left ventricular ejection fraction, by estimation, is 60 to 65%. The left ventricle has normal function. The left ventricle has no regional wall motion abnormalities. There is moderate left ventricular hypertrophy. Left ventricular diastolic parameters were normal.  2. Right ventricular systolic function is normal. The right ventricular size is normal.  3. The mitral valve is normal in structure. No evidence of mitral valve regurgitation. No evidence of mitral stenosis.  4. The aortic valve is normal in structure. Aortic valve regurgitation is not  visualized. No aortic stenosis is  present.  5. The inferior vena cava is normal in size with greater than 50% respiratory variability, suggesting right atrial pressure of 3 mmHg. FINDINGS  Left Ventricle: Left ventricular ejection fraction, by estimation, is 60 to 65%. The left ventricle has normal function. The left ventricle has no regional wall motion abnormalities. The left ventricular internal cavity size was normal in size. There is  moderate left ventricular hypertrophy. Left ventricular diastolic parameters were normal. Right Ventricle: The right ventricular size is normal. No increase in right ventricular wall thickness. Right ventricular systolic function is normal. Left Atrium: Left atrial size was normal in size. Right Atrium: Right atrial size was normal in size. Pericardium: There is no evidence of pericardial effusion. Mitral Valve: The mitral valve is normal in structure. There is mild thickening of the mitral valve leaflet(s). There is mild calcification of the mitral valve leaflet(s). Normal mobility of the mitral valve leaflets. No evidence of mitral valve regurgitation. No evidence of mitral valve stenosis. There is no evidence of mitral valve vegetation. Tricuspid Valve: The tricuspid valve is normal in structure. Tricuspid valve regurgitation is trivial. No evidence of tricuspid stenosis. There is no evidence of tricuspid valve vegetation. Aortic Valve: The aortic valve is normal in structure. Aortic valve regurgitation is not visualized. No aortic stenosis is present. There is no evidence of aortic valve vegetation. Pulmonic Valve: The pulmonic valve was normal in structure. Pulmonic valve regurgitation is not visualized. No evidence of pulmonic stenosis. There is no evidence of pulmonic valve vegetation. Aorta: The aortic root is normal in size and structure. Venous: The inferior vena cava is normal in size with greater than 50% respiratory variability, suggesting right atrial pressure of 3  mmHg. IAS/Shunts: No atrial level shunt detected by color flow Doppler.  LEFT VENTRICLE PLAX 2D LVIDd:         3.10 cm  Diastology LVIDs:         2.40 cm  LV e' lateral:   8.70 cm/s LV PW:         1.80 cm  LV E/e' lateral: 11.3 LV IVS:        1.70 cm  LV e' medial:    6.53 cm/s LVOT diam:     2.40 cm  LV E/e' medial:  15.0 LV SV:         114 LV SV Index:   50 LVOT Area:     4.52 cm  RIGHT VENTRICLE RV Basal diam:  1.70 cm RV S prime:     10.90 cm/s TAPSE (M-mode): 1.6 cm LEFT ATRIUM             Index       RIGHT ATRIUM          Index LA diam:        3.40 cm 1.50 cm/m  RA Area:     8.96 cm LA Vol (A2C):   42.0 ml 18.49 ml/m RA Volume:   17.90 ml 7.88 ml/m LA Vol (A4C):   33.9 ml 14.92 ml/m LA Biplane Vol: 40.7 ml 17.91 ml/m  AORTIC VALVE LVOT Vmax:   116.00 cm/s LVOT Vmean:  74.200 cm/s LVOT VTI:    0.251 m  AORTA Ao Root diam: 3.30 cm MITRAL VALVE MV Area (PHT): 3.71 cm    SHUNTS MV Decel Time: 205 msec    Systemic VTI:  0.25 m MV E velocity: 97.93 cm/s  Systemic Diam: 2.40 cm MV A velocity: 99.53 cm/s MV E/A ratio:  0.98 Jenkins Rouge  MD Electronically signed by Jenkins Rouge MD Signature Date/Time: 11/21/2019/1:20:42 PM    Final     Lab Results:  CBC    Component Value Date/Time   WBC 7.0 08/19/2019 0729   RBC 3.83 (L) 08/19/2019 0729   HGB 11.1 (L) 08/19/2019 0729   HGB 12.9 06/21/2018 1030   HCT 34.5 (L) 08/19/2019 0729   HCT 38.8 06/21/2018 1030   PLT 282.0 08/19/2019 0729   MCV 90.1 08/19/2019 0729   MCV 88 06/21/2018 1030   MCH 28.6 12/13/2018 0121   MCHC 32.2 08/19/2019 0729   RDW 15.0 08/19/2019 0729   RDW 12.8 06/21/2018 1030   LYMPHSABS 1.3 08/19/2019 0729   LYMPHSABS 1.3 06/21/2018 1030   MONOABS 0.4 08/19/2019 0729   EOSABS 0.0 08/19/2019 0729   EOSABS 0.2 06/21/2018 1030   BASOSABS 0.0 08/19/2019 0729   BASOSABS 0.1 06/21/2018 1030    BMET    Component Value Date/Time   NA 143 08/19/2019 0729   K 4.8 08/19/2019 0729   CL 105 08/19/2019 0729   CO2 32 08/19/2019  0729   GLUCOSE 97 08/19/2019 0729   BUN 9 08/19/2019 0729   CREATININE 0.79 08/19/2019 0729   CREATININE 0.69 07/04/2018 1009   CALCIUM 9.0 08/19/2019 0729   GFRNONAA >60 12/14/2018 0410   GFRNONAA 102 07/04/2018 1009   GFRAA >60 12/14/2018 0410   GFRAA 118 07/04/2018 1009    BNP No results found for: BNP  ProBNP No results found for: PROBNP  Specialty Problems      Pulmonary Problems   Chronic rhinitis   Diaphragm dysfunction    Uncertain trigger; right side paralyzed      Shortness of breath   Atelectasis   Severe persistent asthma   OSA (obstructive sleep apnea)   Viral upper respiratory tract infection      No Known Allergies  Immunization History  Administered Date(s) Administered  . Influenza,inj,Quad PF,6+ Mos 01/22/2013, 01/20/2019  . PFIZER SARS-COV-2 Vaccination 07/12/2019, 08/02/2019  . Pneumococcal Conjugate-13 01/24/2018  . Pneumococcal Polysaccharide-23 10/21/2019    Past Medical History:  Diagnosis Date  . Asthma   . Depression   . 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 FUO (fever of unknown origin) 10/21/2019  . GERD (gastroesophageal reflux disease)   . Hypertension   . Migraine     Tobacco History: Social History   Tobacco Use  Smoking Status Never Smoker  Smokeless Tobacco Never Used   Counseling given: Not Answered   Continue to not smoke  Outpatient Encounter Medications as of 12/18/2019  Medication Sig  . albuterol (PROVENTIL) (2.5 MG/3ML) 0.083% nebulizer solution Take 2.5 mg by nebulization every 6 (six) hours as needed for wheezing or shortness of breath.  Marland Kitchen albuterol (VENTOLIN HFA) 108 (90 Base) MCG/ACT inhaler Inhale 2 puffs into the lungs every 6 (six) hours as needed for wheezing or shortness of breath.  . benzonatate (TESSALON PERLES) 100 MG capsule Take 1 capsule (100 mg total) by mouth 3 (three) times daily as needed.  . budesonide-formoterol (SYMBICORT) 160-4.5 MCG/ACT inhaler Inhale 2  puffs into the lungs 2 (two) times daily.  . calcium carbonate (OSCAL) 1500 (600 Ca) MG TABS tablet Take 600 mg of elemental calcium by mouth 2 (two) times daily with a meal.  . Cyanocobalamin (VITAMIN B-12 PO) Take by mouth.  . cyclobenzaprine (FLEXERIL) 10 MG tablet Take 10 mg by mouth 3 (three) times daily as needed (migraines).  . DULoxetine (CYMBALTA)  60 MG capsule Take 1 capsule (60 mg total) by mouth daily.  Eduard Roux (AIMOVIG) 140 MG/ML SOAJ Inject into the skin.  Marland Kitchen FASENRA 30 MG/ML SOSY SECOND SHIP: INJECT ONE SYRINGE UNDER THE SKIN AT WEEK 4 AND 8, THEN EVERY 8 WEEKS THEREAFTER. (Patient taking differently: Inject 30 mg into the skin See admin instructions. Every 8 weeks)  . fluticasone (FLOVENT HFA) 110 MCG/ACT inhaler Inhale 2 puffs into the lungs 2 (two) times daily. During respiratory infections/asthma flares.  . furosemide (LASIX) 20 MG tablet Take 1 tablet (20 mg total) by mouth daily as needed for up to 1 dose for fluid.  Marland Kitchen gabapentin (NEURONTIN) 300 MG capsule Take 300-900 mg by mouth See admin instructions. Take 1 tablet every AM, three tablets at bedtime  . losartan (COZAAR) 50 MG tablet TAKE 1 TABLET BY MOUTH EVERY DAY  . montelukast (SINGULAIR) 10 MG tablet Take 1 tablet (10 mg total) by mouth daily.  . Multiple Vitamin (MULTIVITAMIN WITH MINERALS) TABS tablet Take 1 tablet by mouth daily.  . mupirocin ointment (BACTROBAN) 2 % Apply 1 application topically 3 (three) times daily.  . naproxen (NAPROSYN) 500 MG tablet Take 1 tablet (500 mg total) by mouth 2 (two) times daily with a meal.  . pantoprazole (PROTONIX) 40 MG tablet TAKE 1 TABLET BY MOUTH 2 TIMES DAILY BEFORE A MEAL.  Marland Kitchen Respiratory Therapy Supplies (FLUTTER) DEVI Use 3 times daily as directed  . Rimegepant Sulfate (NURTEC) 75 MG TBDP Take by mouth.  . rizatriptan (MAXALT) 10 MG tablet TAKE 1 TABLET BY MOUTH ONCE AS NEEDED FOR MIGRAINE FOR UP TO 1 DOSE. MAY REPEAT IN 2 HOURS IF NEEDED  . tiZANidine (ZANAFLEX) 4  MG tablet Take 4 mg by mouth every 6 (six) hours as needed for muscle spasms.  . traZODone (DESYREL) 100 MG tablet TAKE 1-2 TABLETS (100-200 MG TOTAL) BY MOUTH AT BEDTIME AS NEEDED FOR SLEEP.  Marland Kitchen valACYclovir (VALTREX) 1000 MG tablet Take 1 tablet (1,000 mg total) by mouth 2 (two) times daily. (Patient taking differently: Take 1,000 mg by mouth as needed. )  . doxycycline (VIBRA-TABS) 100 MG tablet Take 1 tablet (100 mg total) by mouth 2 (two) times daily. (Patient not taking: Reported on 12/18/2019)  . predniSONE (DELTASONE) 20 MG tablet Take 2 tablets (40 mg total) by mouth daily with breakfast. (Patient not taking: Reported on 12/18/2019)  . [DISCONTINUED] Ferrous Sulfate (IRON PO) Take by mouth daily.  . [DISCONTINUED] fluconazole (DIFLUCAN) 150 MG tablet Take 1 tablet (150 mg total) by mouth once a week.   No facility-administered encounter medications on file as of 12/18/2019.     Review of Systems  Review of Systems  Constitutional: Positive for fatigue and fever (99.5). Negative for activity change.  HENT: Negative for sinus pressure, sinus pain and sore throat.   Respiratory: Positive for cough. Negative for shortness of breath and wheezing.   Cardiovascular: Negative for chest pain and palpitations.  Gastrointestinal: Negative for diarrhea, nausea and vomiting.  Musculoskeletal: Negative for arthralgias.  Neurological: Negative for dizziness.  Psychiatric/Behavioral: Negative for sleep disturbance. The patient is not nervous/anxious.      Physical Exam  BP 116/70 (BP Location: Left Arm, Cuff Size: Large)   Pulse (!) 103   Ht 5' 5.5" (1.664 m)   Wt 271 lb 6.4 oz (123.1 kg)   SpO2 95%   BMI 44.48 kg/m   Wt Readings from Last 5 Encounters:  12/18/19 271 lb 6.4 oz (123.1 kg)  11/17/19 Marland Kitchen)  273 lb 6.4 oz (124 kg)  11/07/19 275 lb (124.7 kg)  10/21/19 266 lb (120.7 kg)  10/13/19 270 lb (122.5 kg)    BMI Readings from Last 5 Encounters:  12/18/19 44.48 kg/m  11/17/19 44.80  kg/m  11/07/19 45.07 kg/m  10/21/19 44.26 kg/m  10/13/19 44.93 kg/m     Physical Exam Vitals and nursing note reviewed.  Constitutional:      General: She is not in acute distress.    Appearance: Normal appearance. She is obese. She is ill-appearing and toxic-appearing.  HENT:     Head: Normocephalic and atraumatic.     Right Ear: External ear normal.     Left Ear: External ear normal.     Nose:     Comments: Deferred due to masking requirement    Mouth/Throat:     Comments: Deferred due to masking requirement Eyes:     Pupils: Pupils are equal, round, and reactive to light.  Cardiovascular:     Rate and Rhythm: Normal rate and regular rhythm.     Pulses: Normal pulses.     Heart sounds: Normal heart sounds. No murmur heard.   Pulmonary:     Effort: Pulmonary effort is normal. No respiratory distress.     Breath sounds: Normal breath sounds. No decreased air movement. No decreased breath sounds, wheezing or rales.     Comments: Ongoing cyclical cough, throat clearing and office visit Musculoskeletal:     Cervical back: Normal range of motion.  Skin:    General: Skin is warm and dry.     Capillary Refill: Capillary refill takes less than 2 seconds.  Neurological:     General: No focal deficit present.     Mental Status: She is alert and oriented to person, place, and time. Mental status is at baseline.     Gait: Gait normal.  Psychiatric:        Mood and Affect: Mood normal.        Behavior: Behavior normal.        Thought Content: Thought content normal.        Judgment: Judgment normal.       Assessment & Plan:   Viral upper respiratory tract infection Likely viral upper respiratory infection Known exposure to spouse who had similar symptoms about a week ago   Plan: Chest x-ray today Okay to start doxycycline as outlined by primary care given discolored sputum and history of severe persistent eosinophilic asthma Schedule follow-up with allergy  asthma Keep follow-up with primary care Over-the-counter measures for management of cough suppression Encourage Tessalon Perles Nasal saline rinses   Severe persistent asthma Plan: Chest x-ray today Prednisone taper as outlined by primary care Doxycycline as outlined by primary care Schedule follow-up with allergy asthma Work on increasing overall physical activity Schedule follow-up with Dr. Vaughan Browner  OSA (obstructive sleep apnea) Known history of mild obstructive sleep apnea Currently using nocturnal oxygen and not using CPAP Patient currently uses nocturnal oxygen when she "feels like it" Patient reports that she is monitoring her oxygen levels at night via her apple watch Previous referrals to sleep medicine, most recent appointment canceled due to patient becoming ill  Plan: Would encourage patient to meet with sleep MD to discuss treatment options for mild obstructive sleep apnea especially given nocturnal hypoxemia  Diaphragm dysfunction Could be contributing factor to shortness of breath  Plan: Continue nocturnal oxygen Schedule follow-up with Dr. Vaughan Browner  Chronic rhinitis Plan: Continue Flonase and Astelin as managed by allergy  asthma Start nasal saline rinses Schedule follow-up with allergy asthma    Return in about 6 weeks (around 01/29/2020), or if symptoms worsen or fail to improve, for Follow up with Dr. Vaughan Browner.  12/22/2019: Addendum: Addendum made at patient's request.  See telephone note from 12/18/2019.  Lauraine Rinne, NP 12/18/2019   This appointment required 34  minutes of patient care (this includes precharting, chart review, review of results, face-to-face care, etc.).

## 2019-12-18 NOTE — Progress Notes (Signed)
Called and spoke with patient about xray results per Wyn Quaker. All questions answered and patient expressed full understanding.

## 2019-12-18 NOTE — Assessment & Plan Note (Signed)
Plan: Continue Flonase and Astelin as managed by allergy asthma Start nasal saline rinses Schedule follow-up with allergy asthma

## 2019-12-18 NOTE — Assessment & Plan Note (Signed)
Plan: Chest x-ray today Prednisone taper as outlined by primary care Doxycycline as outlined by primary care Schedule follow-up with allergy asthma Work on increasing overall physical activity Schedule follow-up with Dr. Vaughan Browner

## 2019-12-18 NOTE — Assessment & Plan Note (Signed)
Could be contributing factor to shortness of breath  Plan: Continue nocturnal oxygen Schedule follow-up with Dr. Vaughan Browner

## 2019-12-18 NOTE — Progress Notes (Signed)
Virtual Visit via Video Note  I connected with Alyssa Clay  on 12/18/19 at 11:20 AM EDT by a video enabled telemedicine application and verified that I am speaking with the correct person using two identifiers.  Location patient: home Location provider:work or home office Persons participating in the virtual visit: patient, provider  I discussed the limitations of evaluation and management by telemedicine and the availability of in person appointments. The patient expressed understanding and agreed to proceed.   HPI:  Acute visit for cough: -started about 4-5 days ago, feels is getting worse -symptoms include: cough, nasal congestion, laryngitis, PND, sore throat, temp 99.5, ears full, increased mucus production that is yellow and green -denies SOB, wheezing -O2 has been good -has a history of asthma and paralysis of the diaphragm - reports she usually requires an antibiotic and prednisone -inhalers and nasal sprays not helping with the bad cough -COVID19 testing was negative for her husband and her - both with similar symptoms -fully vaccinated for COVID19   ROS: See pertinent positives and negatives per HPI.  Past Medical History:  Diagnosis Date  . Asthma   . Depression   . 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 FUO (fever of unknown origin) 10/21/2019  . GERD (gastroesophageal reflux disease)   . Hypertension   . Migraine     Past Surgical History:  Procedure Laterality Date  . BREAST REDUCTION SURGERY Bilateral 06/12/2019   Procedure: BREAST REDUCTION WITH LIPOSUCTION;  Surgeon: Wallace Going, DO;  Location: [facility];  Service: Plastics;  Laterality: Bilateral;  4 hours, please  . CESAREAN SECTION  1994  . DILATION AND CURETTAGE OF UTERUS     multiple due to miscarriages  . KNEE ARTHROSCOPY Left 2013  . LAPAROSCOPIC APPENDECTOMY N/A 12/08/2018   Procedure: APPENDECTOMY LAPAROSCOPIC;  Surgeon: Donnie Mesa, MD;  Location: [facility];  Service: General;  Laterality: N/A;  . TENNIS ELBOW RELEASE/NIRSCHEL PROCEDURE  2009  . WISDOM TOOTH EXTRACTION      Family History  Problem Relation Age of Onset  . Depression Mother   . Lymphoma Mother 20  . Hypertension Father   . Heart attack Father 45  . Hypertension Brother   . High blood pressure Brother   . High Cholesterol Brother   . Healthy Daughter   . Colon cancer Other        aunt   . Esophageal cancer Neg Hx   . Pancreatic cancer Neg Hx   . Stomach cancer Neg Hx     SOCIAL HX: see hpi   Current Outpatient Medications:  .  albuterol (PROVENTIL) (2.5 MG/3ML) 0.083% nebulizer solution, Take 2.5 mg by nebulization every 6 (six) hours as needed for wheezing or shortness of breath., Disp: , Rfl:  .  albuterol (VENTOLIN HFA) 108 (90 Base) MCG/ACT inhaler, Inhale 2 puffs into the lungs every 6 (six) hours as needed for wheezing or shortness of breath., Disp: 18 g, Rfl: 2 .  benzonatate (TESSALON PERLES) 100 MG capsule, Take 1 capsule (100 mg total) by mouth 3 (three) times daily as needed., Disp: 20 capsule, Rfl: 0 .  budesonide-formoterol (SYMBICORT) 160-4.5 MCG/ACT inhaler, Inhale 2 puffs into the lungs 2 (two) times daily., Disp: 1 Inhaler, Rfl: 5 .  calcium carbonate (OSCAL) 1500 (600 Ca) MG TABS tablet, Take 600 mg of elemental calcium by mouth 2 (two) times daily with a meal., Disp: , Rfl:  .  Cyanocobalamin (VITAMIN B-12 PO), Take  by mouth., Disp: , Rfl:  .  cyclobenzaprine (FLEXERIL) 10 MG tablet, Take 10 mg by mouth 3 (three) times daily as needed (migraines)., Disp: , Rfl:  .  DULoxetine (CYMBALTA) 60 MG capsule, Take 1 capsule (60 mg total) by mouth daily., Disp: 90 capsule, Rfl: 1 .  Erenumab-aooe (AIMOVIG) 140 MG/ML SOAJ, Inject into the skin., Disp: , Rfl:  .  FASENRA 30 MG/ML SOSY, SECOND SHIP: INJECT ONE SYRINGE UNDER THE SKIN AT WEEK 4 AND 8, THEN EVERY 8 WEEKS THEREAFTER. (Patient taking differently: Inject 30 mg into the skin See admin instructions.  Every 8 weeks), Disp: 1 Syringe, Rfl: 8 .  fluticasone (FLOVENT HFA) 110 MCG/ACT inhaler, Inhale 2 puffs into the lungs 2 (two) times daily. During respiratory infections/asthma flares., Disp: 1 Inhaler, Rfl: 5 .  furosemide (LASIX) 20 MG tablet, Take 1 tablet (20 mg total) by mouth daily as needed for up to 1 dose for fluid., Disp: 60 tablet, Rfl: 0 .  gabapentin (NEURONTIN) 300 MG capsule, Take 300-900 mg by mouth See admin instructions. Take 1 tablet every AM, three tablets at bedtime, Disp: , Rfl:  .  losartan (COZAAR) 50 MG tablet, TAKE 1 TABLET BY MOUTH EVERY DAY, Disp: 90 tablet, Rfl: 1 .  montelukast (SINGULAIR) 10 MG tablet, Take 1 tablet (10 mg total) by mouth daily., Disp: 30 tablet, Rfl: 5 .  Multiple Vitamin (MULTIVITAMIN WITH MINERALS) TABS tablet, Take 1 tablet by mouth daily., Disp: , Rfl:  .  mupirocin ointment (BACTROBAN) 2 %, Apply 1 application topically 3 (three) times daily., Disp: 30 g, Rfl: 0 .  naproxen (NAPROSYN) 500 MG tablet, Take 1 tablet (500 mg total) by mouth 2 (two) times daily with a meal., Disp: 30 tablet, Rfl: 0 .  pantoprazole (PROTONIX) 40 MG tablet, TAKE 1 TABLET BY MOUTH 2 TIMES DAILY BEFORE A MEAL., Disp: 60 tablet, Rfl: 3 .  Respiratory Therapy Supplies (FLUTTER) DEVI, Use 3 times daily as directed, Disp: 1 each, Rfl: 0 .  Rimegepant Sulfate (NURTEC) 75 MG TBDP, Take by mouth., Disp: , Rfl:  .  rizatriptan (MAXALT) 10 MG tablet, TAKE 1 TABLET BY MOUTH ONCE AS NEEDED FOR MIGRAINE FOR UP TO 1 DOSE. MAY REPEAT IN 2 HOURS IF NEEDED, Disp: , Rfl:  .  tiZANidine (ZANAFLEX) 4 MG tablet, Take 4 mg by mouth every 6 (six) hours as needed for muscle spasms., Disp: , Rfl:  .  traZODone (DESYREL) 100 MG tablet, TAKE 1-2 TABLETS (100-200 MG TOTAL) BY MOUTH AT BEDTIME AS NEEDED FOR SLEEP., Disp: 180 tablet, Rfl: 2 .  valACYclovir (VALTREX) 1000 MG tablet, Take 1 tablet (1,000 mg total) by mouth 2 (two) times daily. (Patient taking differently: Take 1,000 mg by mouth as  needed. ), Disp: 18 tablet, Rfl: 3 .  doxycycline (VIBRA-TABS) 100 MG tablet, Take 1 tablet (100 mg total) by mouth 2 (two) times daily. (Patient not taking: Reported on 12/18/2019), Disp: 14 tablet, Rfl: 0 .  predniSONE (DELTASONE) 20 MG tablet, Take 2 tablets (40 mg total) by mouth daily with breakfast. (Patient not taking: Reported on 12/18/2019), Disp: 8 tablet, Rfl: 0  EXAM:  VITALS per patient if applicable:  GENERAL: alert, oriented, appears well and in no acute distress  HEENT: atraumatic, conjunttiva clear, no obvious abnormalities on inspection of external nose and ears  NECK: normal movements of the head and neck  LUNGS: on inspection no signs of respiratory distress, breathing rate appears normal, no obvious gross SOB, gasping or wheezing, lots  of coughing during the visit  CV: no obvious cyanosis  MS: moves all visible extremities without noticeable abnormality  PSYCH/NEURO: pleasant and cooperative, no obvious depression or anxiety, speech and thought processing grossly intact  ASSESSMENT AND PLAN:  Discussed the following assessment and plan:  Cough  Respiratory illness  Asthma with acute exacerbation, unspecified asthma severity, unspecified whether persistent  Diaphragm dysfunction  -we discussed possible serious and likely etiologies, options for evaluation and workup, limitations of telemedicine visit vs in person visit, treatment, treatment risks and precautions. Pt prefers to treat via telemedicine empirically rather then risking or undertaking an in person visit at this moment. Worsening symptoms, likely viral trigger. Vaccinated for covid and neg covid test. She opted to start prednisone burst given asthma with likely exacerbation and antibiotic given worsening, lots of discolored mucus and underlying disease. Advised close follow up with PCP or pulm and to seek inperson care if worsening or not improving.   I discussed the assessment and treatment plan with  the patient. The patient was provided an opportunity to ask questions and all were answered. The patient agreed with the plan and demonstrated an understanding of the instructions.   The patient was advised to call back or seek an in-person evaluation if the symptoms worsen or if the condition fails to improve as anticipated.   Lucretia Kern, DO

## 2019-12-18 NOTE — Patient Instructions (Signed)
-  I sent the medication(s) we discussed to your pharmacy: Meds ordered this encounter  Medications  . predniSONE (DELTASONE) 20 MG tablet    Sig: Take 2 tablets (40 mg total) by mouth daily with breakfast.    Dispense:  8 tablet    Refill:  0  . doxycycline (VIBRA-TABS) 100 MG tablet    Sig: Take 1 tablet (100 mg total) by mouth 2 (two) times daily.    Dispense:  14 tablet    Refill:  0     I hope you are feeling better soon! Seek inperson care or follow up promptly with your lung doctor or primary doctor if your symptoms worsen, new concerns arise or you are not improving with treatment.  Follow up with your doctor next week.

## 2019-12-18 NOTE — Assessment & Plan Note (Signed)
Likely viral upper respiratory infection Known exposure to spouse who had similar symptoms about a week ago   Plan: Chest x-ray today Okay to start doxycycline as outlined by primary care given discolored sputum and history of severe persistent eosinophilic asthma Schedule follow-up with allergy asthma Keep follow-up with primary care Over-the-counter measures for management of cough suppression Encourage Tessalon Perles Nasal saline rinses

## 2019-12-18 NOTE — Telephone Encounter (Signed)
Patient seen in clinic.  Agree with current recommendations.  Will obtain chest x-ray.  Encourage patient to start over-the-counter measures for management of cough.  Likely viral given known sick exposure to spouse.  Reassuring the patient has negative Covid test.  Encourage patient to schedule follow-up with allergy asthma as well as with our clinic.  Do not believe there is an indication at this time for narcotic cough meds as requested by the patient.Wyn Quaker, FNP

## 2019-12-18 NOTE — Patient Instructions (Addendum)
You were seen today by Lauraine Rinne, NP  for:   1. Viral upper respiratory tract infection  - DG Chest 2 View; Future  I am sorry you are not feeling well.  I believe you have encountered a virus and that is causing upper respiratory infection  We will obtain a chest x-ray today  Okay to start doxycycline as outlined by your virtual visit earlier today with primary care  Okay to start prednisone as outlined by virtual visit with primary care  Cough Home Instructions:  We believe you have a chronic/cyclical cough that is aggravated by reflux , coughing , and drainage.  . Goal is to not Cough or clear throat.  Marland Kitchen Avoid coughing or clearing throat by using:  o non-mint products/sugarless candy o Water o ice chips o Remember NO MINT PRODUCTS  . Medications to use:  o Mucinex DM 1-2 every 12 hrs or Delsym 2 tsp every 12 hrs for cough (These are Over the counter) o Tessalon Three times a day  As needed  Cough.  o Zyrtec 10mg  at bedtime (Can use generic, this is over the counter) o Chlor tabs 4mg  2 at bedtime  for nasal drip until cough is 100% cough free. (this medication is over the counter)    2. Severe persistent asthma, unspecified whether complicated  Continue Fasenra  Continue Symbicort 160 >>> 2 puffs in the morning right when you wake up, rinse out your mouth after use, 12 hours later 2 puffs, rinse after use >>> Take this daily, no matter what >>> This is not a rescue inhaler   Please schedule follow-up with your allergy/asthma MD  3. OSA (obstructive sleep apnea)  You have mild obstructive sleep apnea based off documentation  I would recommend that you coordinate a follow-up with a sleep MD within our office   We recommend today:  Orders Placed This Encounter  Procedures  . DG Chest 2 View    Standing Status:   Future    Standing Expiration Date:   04/18/2020    Order Specific Question:   Reason for Exam (SYMPTOM  OR DIAGNOSIS REQUIRED)    Answer:   cough     Order Specific Question:   Preferred imaging location?    Answer:   Internal    Order Specific Question:   Radiology Contrast Protocol - do NOT remove file path    Answer:   [email or web address]   Orders Placed This Encounter  Procedures  . DG Chest 2 View   No orders of the defined types were placed in this encounter.   Follow Up:    Return in about 6 weeks (around 01/29/2020), or if symptoms worsen or fail to improve, for Follow up with Dr. Vaughan Browner. >>> Due for follow-up with Dr. Vaughan Browner  Please also schedule a 30-minute sleep MD consult slot     Please do your part to reduce the spread of COVID-19:      Reduce your risk of any infection  and COVID19 by using the similar precautions used for avoiding the common cold or flu:  Marland Kitchen Wash your hands often with soap and warm water for at least 20 seconds.  If soap and water are not readily available, use an alcohol-based hand sanitizer with at least 60% alcohol.  . If coughing or sneezing, cover your mouth and nose by coughing or sneezing into the elbow areas of your shirt or coat, into a tissue or into your sleeve (not your hands). Langley Gauss  A MASK when in public  . Avoid shaking hands with others and consider head nods or verbal greetings only. . Avoid touching your eyes, nose, or mouth with unwashed hands.  . Avoid close contact with people who are sick. . Avoid places or events with large numbers of people in one location, like concerts or sporting events. . If you have some symptoms but not all symptoms, continue to monitor at home and seek medical attention if your symptoms worsen. . If you are having a medical emergency, call 911.   [place] / e-Visit: [email or web address]         [facility]: [facility]: [phone]                   [facility]: [phone]     It is flu season:   >>> Best ways to protect herself from the flu: Receive the yearly flu vaccine, practice good hand hygiene washing with soap and also using hand sanitizer when available, eat a nutritious meals, get adequate rest, hydrate appropriately   Please contact the office if your symptoms worsen or you have concerns that you are not improving.   Thank you for choosing [facility] for your healthcare, and for allowing [place] to partner with you on your healthcare journey. I am thankful to be able to provide care to you today.   Wyn Quaker FNP-C

## 2019-12-19 NOTE — Telephone Encounter (Signed)
Pt calling again to speak to manager. Clinical Manager out of office. Will return call once she is available.

## 2019-12-20 ENCOUNTER — Other Ambulatory Visit: Payer: Self-pay

## 2019-12-20 ENCOUNTER — Encounter (HOSPITAL_COMMUNITY): Payer: Self-pay

## 2019-12-20 ENCOUNTER — Ambulatory Visit (HOSPITAL_COMMUNITY)
Admission: EM | Admit: 2019-12-20 | Discharge: 2019-12-20 | Disposition: A | Discharge status: Home / Self Care | Payer: 59 | Attending: Physician Assistant | Admitting: Physician Assistant

## 2019-12-20 ENCOUNTER — Ambulatory Visit (INDEPENDENT_AMBULATORY_CARE_PROVIDER_SITE_OTHER): Payer: 59

## 2019-12-20 DIAGNOSIS — R05 Cough: Secondary | ICD-10-CM

## 2019-12-20 DIAGNOSIS — J040 Acute laryngitis: Secondary | ICD-10-CM | POA: Diagnosis not present

## 2019-12-20 DIAGNOSIS — J069 Acute upper respiratory infection, unspecified: Secondary | ICD-10-CM | POA: Diagnosis not present

## 2019-12-20 IMAGING — DX DG CHEST 2V
2 series · 2 of 2 positions shown · non-contrast
Comparison: Chest radiograph [DATE]

CLINICAL DATA: Patient is currently coughing, laryngitis, deep
barking dry cough, sick for 6 days, no fever. Negative covid test 3
days ago.

EXAM:
CHEST - 2 VIEW

[chest pa]
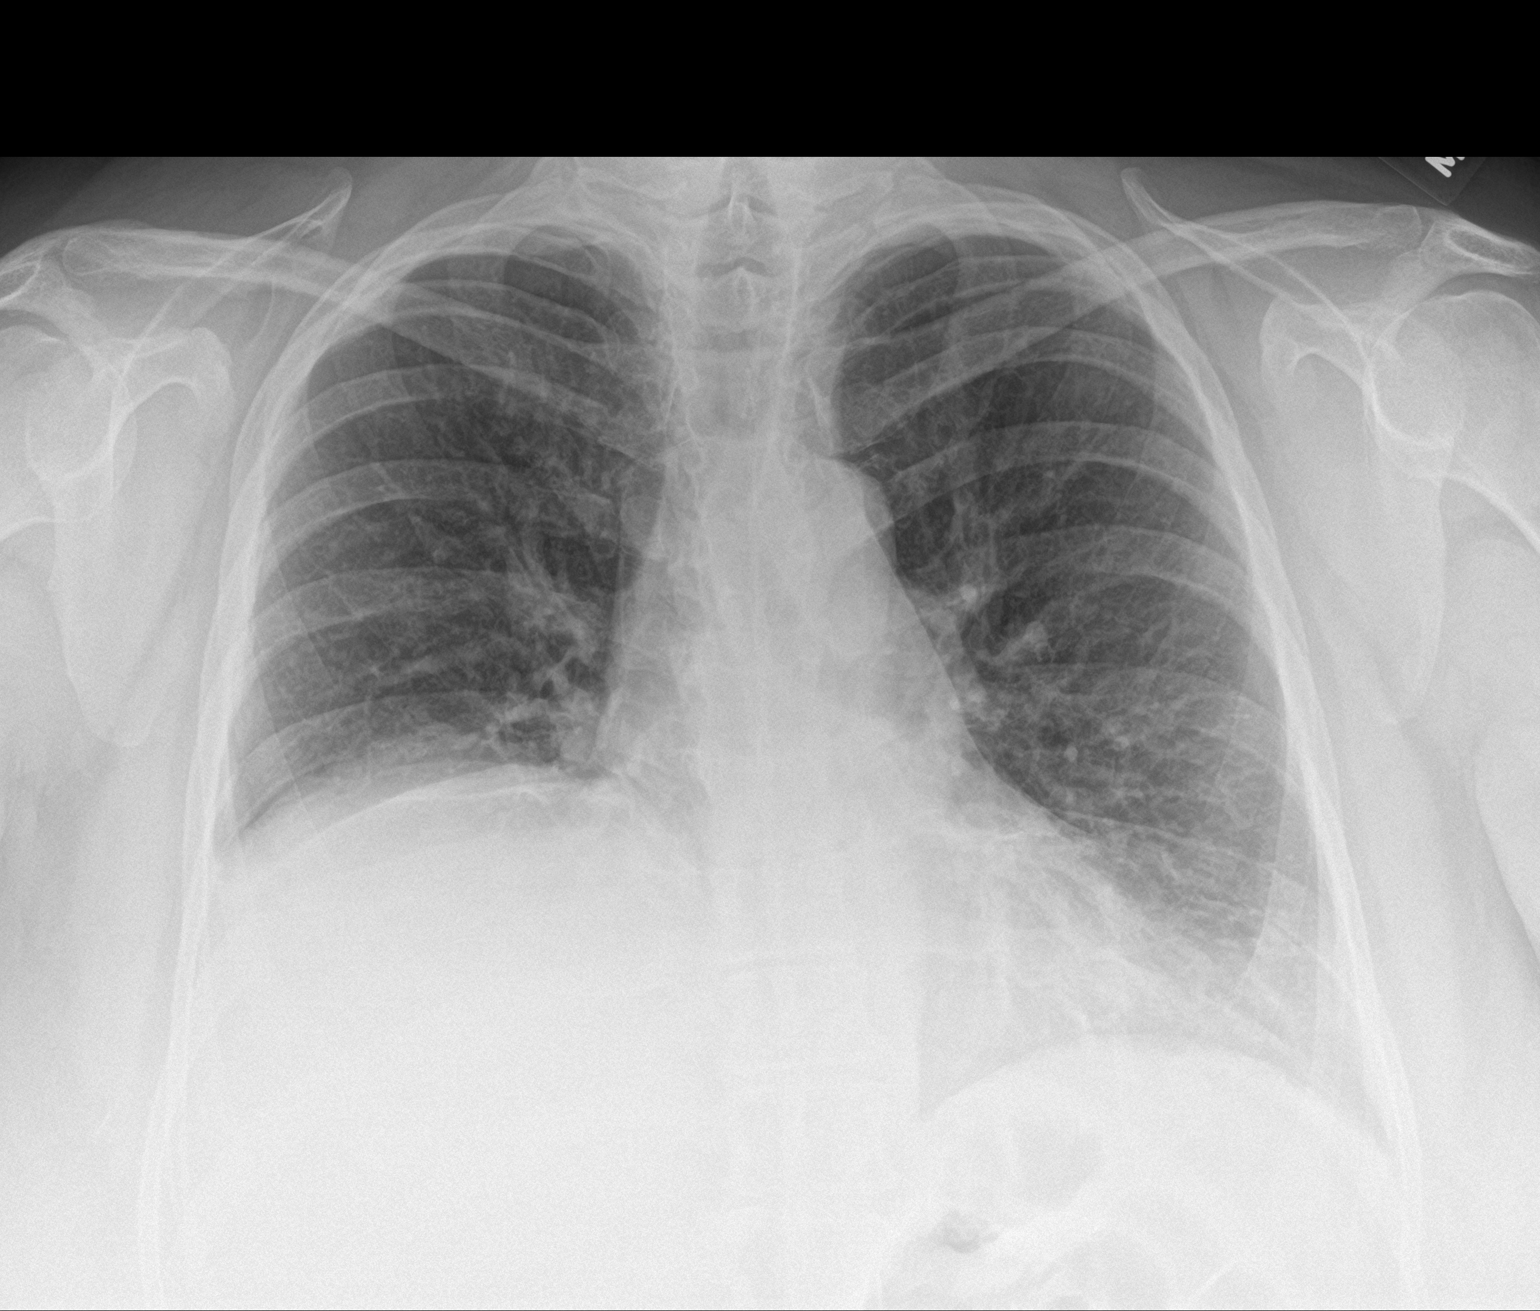

[chest lat]
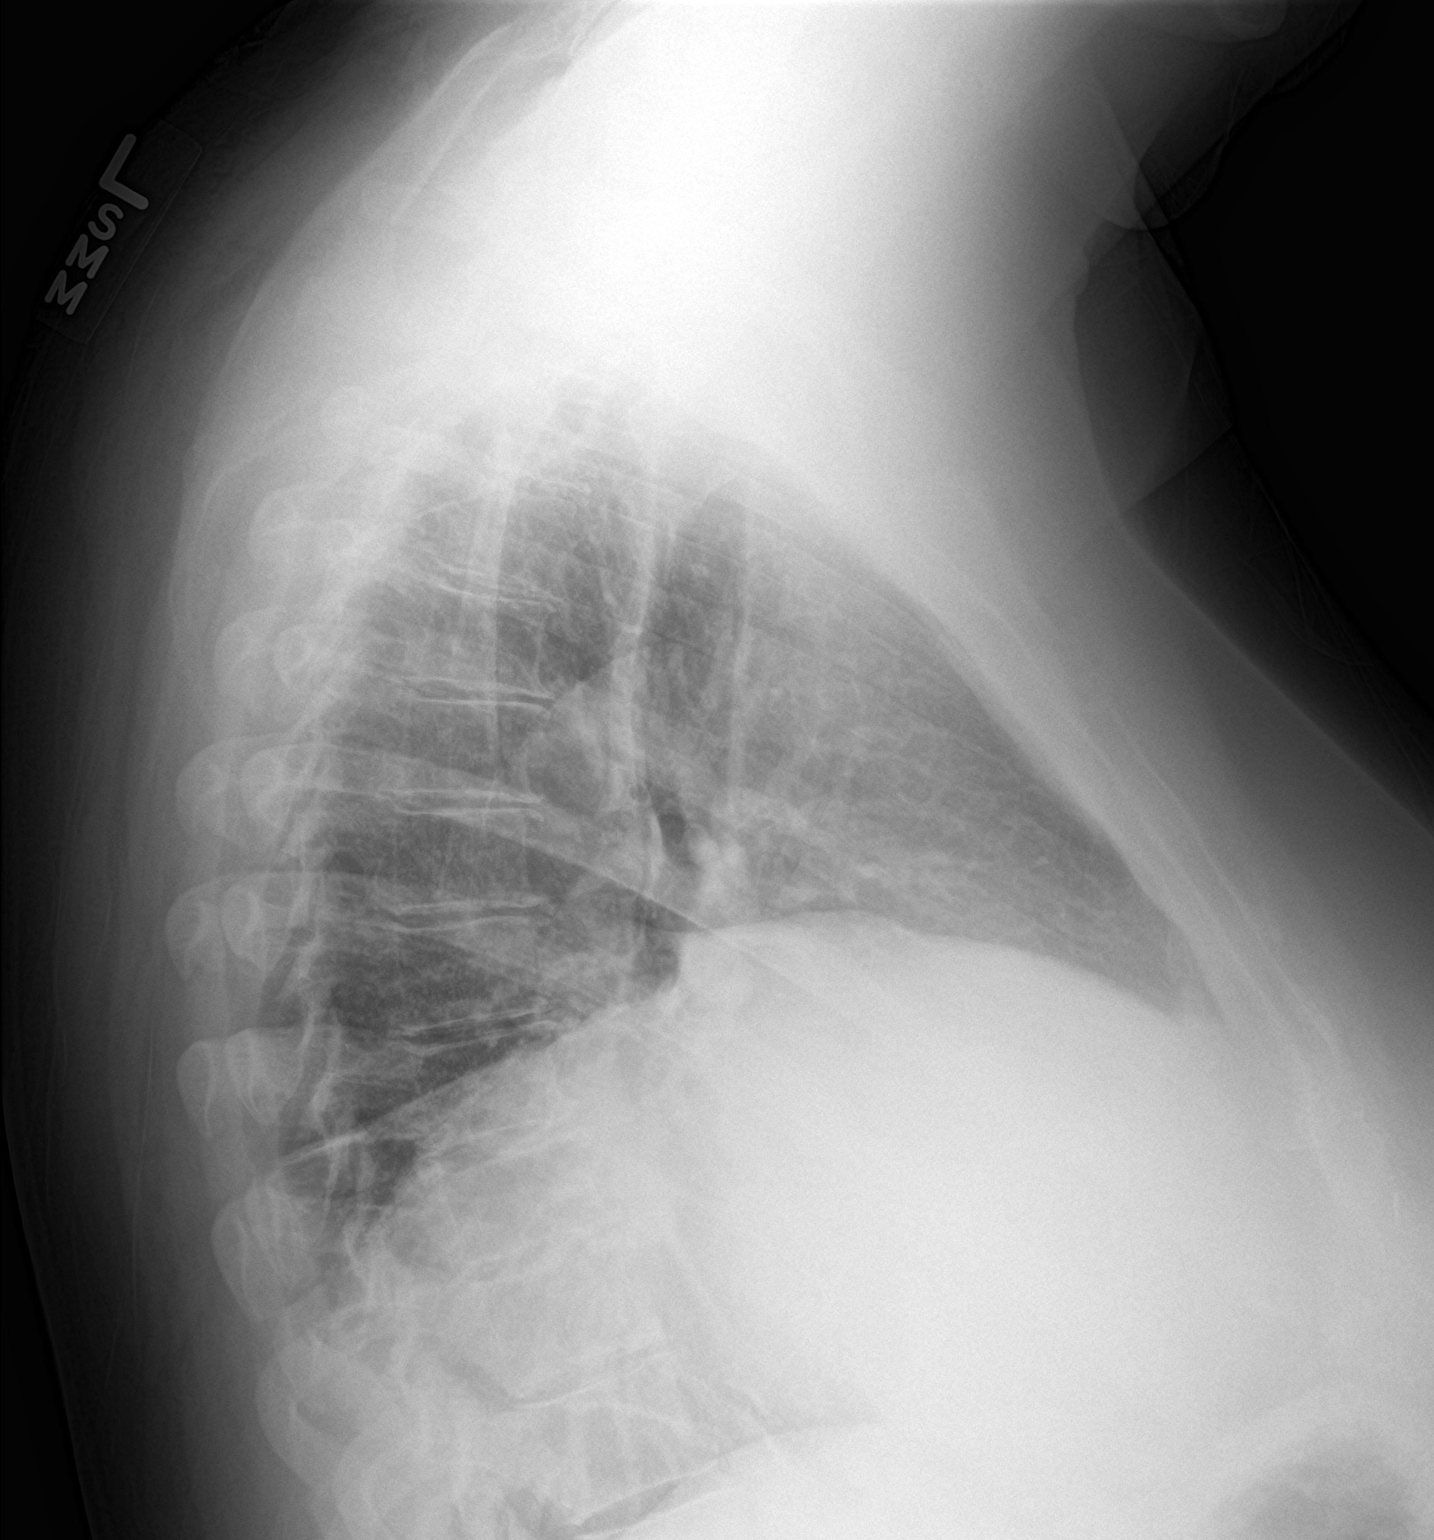

[2 of 2 positions shown; findings below may reference images not displayed]

FINDINGS: Stable cardiomediastinal contours. Chronically elevated right
hemidiaphragm. There are a few mild opacities at the right lung base
likely atelectasis or scarring. No new focal opacity. No
pneumothorax or pleural effusion. No acute finding in the visualized
skeleton.
IMPRESSION: Mild opacities at the right lung base likely atelectasis or
scarring. No evidence of active disease.

## 2019-12-20 MED ORDER — PROMETHAZINE-DM 6.25-15 MG/5ML PO SYRP: 2.5000 mL | ORAL_SOLUTION | Freq: Every day | ORAL | 0 refills | Status: AC

## 2019-12-20 MED ORDER — GUAIFENESIN ER 600 MG PO TB12
600.0000 mg | ORAL_TABLET | Freq: Two times a day (BID) | ORAL | 0 refills | Status: AC
Start: 2019-12-20 — End: 2019-12-30

## 2019-12-20 NOTE — Discharge Instructions (Signed)
Continue medications prescribed by your PCP  Take the promethazine DM at night, this will make you sleepy, do not drive, drink alcohol or operate machinery within 8 hours  Take mucinex two times a day   follow-up with your primary care on Monday and pulmonologist  If severely worsening symptoms such as shortness of breath, chest pain or high fevers or other concerning symptoms, go to the emergency department

## 2019-12-20 NOTE — ED Triage Notes (Signed)
Patient complains of sore throat, upper back pain, somewhat-productive cough, and loss of voice. Reports she had a negative COVID test Wednesday; declines COVID testing today.

## 2019-12-20 NOTE — ED Provider Notes (Signed)
[facility]    CSN: [id number] Arrival date & time: 12/20/19  1124      History   Chief Complaint Chief Complaint  Patient presents with  . Hoarse  . Cough    HPI Alyssa Clay is a 54 y.o. female.   Patient reports for evaluation of continued cough, voice loss and nasal congestion.  Patient has been seen for same by family medicine and pulmonology this week.  She had negative Covid test.  She was placed on prednisone and doxycycline by her primary care.  Chest x-ray with pulmonology group on 12/18/2019 without acute pathology.  She reports since then she has had continued cough with yellow sputum.  She reports cough is much worse at night.  She denies shortness of breath.  She does endorse some rib pain with cough.  Otherwise denies chest pain.  She denies sore throat but has lost her voice.  Denies nausea, vomiting or diarrhea.  She has been taking the medicines prescribed to her.  Patient has had both Covid vaccines.  Declines Covid testing today.  She is primarily concerned because she is not getting better and endorses cough might be somewhat worse.     Past Medical History:  Diagnosis Date  . Asthma   . Depression   . 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 FUO (fever of unknown origin) 10/21/2019  . GERD (gastroesophageal reflux disease)   . Hypertension   . Migraine     Patient Active Problem List   Diagnosis Date Noted  . OSA (obstructive sleep apnea) 12/18/2019  . Viral upper respiratory tract infection 12/18/2019  . FUO (fever of unknown origin) 10/21/2019  . S/P bilateral breast reduction 06/19/2019  . Symptomatic mammary hypertrophy 04/15/2019  . Back pain 04/15/2019  . Neck pain 04/15/2019  . History of frequent upper respiratory infection 01/16/2019  . Screening for viral disease 01/16/2019  . Atelectasis 01/16/2019  . Severe persistent asthma 01/16/2019  . Shortness of breath 01/01/2019  . Acute appendicitis  12/07/2018  . Primary osteoarthritis of both knees 07/10/2018  . Hypertension 03/27/2018  . 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 Chronic rhinitis 01/07/2018  . Gastroesophageal reflux disease 01/07/2018    Past Surgical History:  Procedure Laterality Date  . BREAST REDUCTION SURGERY Bilateral 06/12/2019   Procedure: BREAST REDUCTION WITH LIPOSUCTION;  Surgeon: Wallace Going, DO;  Location: [place];  Service: Plastics;  Laterality: Bilateral;  4 hours, please  . CESAREAN SECTION  1994  . DILATION AND CURETTAGE OF UTERUS     multiple due to miscarriages  . KNEE ARTHROSCOPY Left 2013  . LAPAROSCOPIC APPENDECTOMY N/A 12/08/2018   Procedure: APPENDECTOMY LAPAROSCOPIC;  Surgeon: Donnie Mesa, MD;  Location: [place];  Service: General;  Laterality: N/A;  . TENNIS ELBOW RELEASE/NIRSCHEL PROCEDURE  2009  . WISDOM TOOTH EXTRACTION      OB History   No obstetric history on file.      Home Medications    Prior to Admission medications   Medication Sig Start Date End Date Taking? Authorizing Provider  albuterol (PROVENTIL) (2.5 MG/3ML) 0.083% nebulizer solution Take 2.5 mg by nebulization every 6 (six) hours as needed for wheezing or shortness of breath.    [provider]  albuterol (VENTOLIN HFA) 108 (90 Base) MCG/ACT inhaler Inhale 2 puffs into the lungs every 6 (six) hours as needed for wheezing or shortness of breath. 10/13/19  Garnet Sierras, DO  benzonatate (TESSALON PERLES) 100 MG capsule Take 1 capsule (100 mg total) by mouth 3 (three) times daily as needed. 12/16/19   Lucretia Kern, DO  budesonide-formoterol (SYMBICORT) 160-4.5 MCG/ACT inhaler Inhale 2 puffs into the lungs 2 (two) times daily. 10/13/19   Garnet Sierras, DO  calcium carbonate (OSCAL) 1500 (600 Ca) MG TABS tablet Take 600 mg of elemental calcium by mouth 2 (two) times daily with a meal.    [provider]  Cyanocobalamin (VITAMIN B-12 PO) Take by mouth.     [provider]  cyclobenzaprine (FLEXERIL) 10 MG tablet Take 10 mg by mouth 3 (three) times daily as needed (migraines).    [provider]  doxycycline (VIBRA-TABS) 100 MG tablet Take 1 tablet (100 mg total) by mouth 2 (two) times daily. Patient not taking: Reported on 12/18/2019 12/18/19   Lucretia Kern, DO  DULoxetine (CYMBALTA) 60 MG capsule Take 1 capsule (60 mg total) by mouth daily. 06/13/19   Koberlein, Steele Berg, MD  Erenumab-aooe (AIMOVIG) 140 MG/ML SOAJ Inject into the skin. 12/19/18 12/20/19  [provider]  FASENRA 30 MG/ML SOSY SECOND SHIP: INJECT ONE SYRINGE UNDER THE SKIN AT WEEK 4 AND 8, THEN EVERY 8 WEEKS THEREAFTER. Patient taking differently: Inject 30 mg into the skin See admin instructions. Every 8 weeks 09/24/18   Garnet Sierras, DO  fluticasone (FLOVENT HFA) 110 MCG/ACT inhaler Inhale 2 puffs into the lungs 2 (two) times daily. During respiratory infections/asthma flares. 11/12/19   Garnet Sierras, DO  furosemide (LASIX) 20 MG tablet Take 1 tablet (20 mg total) by mouth daily as needed for up to 1 dose for fluid. 11/17/19   Laurey Morale, MD  gabapentin (NEURONTIN) 300 MG capsule Take 300-900 mg by mouth See admin instructions. Take 1 tablet every AM, three tablets at bedtime 01/03/18   [provider]  guaiFENesin (MUCINEX) 600 MG 12 hr tablet Take 1 tablet (600 mg total) by mouth 2 (two) times daily for 10 days. 12/20/19 12/30/19  Savreen Gebhardt, Marguerita Beards, PA-C  losartan (COZAAR) 50 MG tablet TAKE 1 TABLET BY MOUTH EVERY DAY 10/06/19   Koberlein, Steele Berg, MD  montelukast (SINGULAIR) 10 MG tablet Take 1 tablet (10 mg total) by mouth daily. 10/13/19   Garnet Sierras, DO  Multiple Vitamin (MULTIVITAMIN WITH MINERALS) TABS tablet Take 1 tablet by mouth daily.    [provider]  mupirocin ointment (BACTROBAN) 2 % Apply 1 application topically 3 (three) times daily. 11/07/19   Jaynee Eagles, PA-C  naproxen (NAPROSYN) 500 MG tablet Take 1 tablet (500 mg total) by mouth  2 (two) times daily with a meal. 11/07/19   Jaynee Eagles, PA-C  pantoprazole (PROTONIX) 40 MG tablet TAKE 1 TABLET BY MOUTH 2 TIMES DAILY BEFORE A MEAL. 11/24/19   Caren Macadam, MD  predniSONE (DELTASONE) 20 MG tablet Take 2 tablets (40 mg total) by mouth daily with breakfast. Patient not taking: Reported on 12/18/2019 12/18/19   Lucretia Kern, DO  promethazine-dextromethorphan (PROMETHAZINE-DM) 6.25-15 MG/5ML syrup Take 2.5 mLs by mouth at bedtime for 10 days. 12/20/19 12/30/19  Ademide Schaberg, Marguerita Beards, PA-C  Respiratory Therapy Supplies (FLUTTER) DEVI Use 3 times daily as directed 01/22/19   Mannam, Praveen, MD  Rimegepant Sulfate (NURTEC) 75 MG TBDP Take by mouth.    [provider]  rizatriptan (MAXALT) 10 MG tablet TAKE 1 TABLET BY MOUTH ONCE AS NEEDED FOR MIGRAINE FOR UP TO 1 DOSE. MAY  REPEAT IN 2 HOURS IF NEEDED 10/20/19   [provider]  tiZANidine (ZANAFLEX) 4 MG tablet Take 4 mg by mouth every 6 (six) hours as needed for muscle spasms.    [provider]  traZODone (DESYREL) 100 MG tablet TAKE 1-2 TABLETS (100-200 MG TOTAL) BY MOUTH AT BEDTIME AS NEEDED FOR SLEEP. 05/30/19   Koberlein, Steele Berg, MD  valACYclovir (VALTREX) 1000 MG tablet Take 1 tablet (1,000 mg total) by mouth 2 (two) times daily. Patient taking differently: Take 1,000 mg by mouth as needed.  05/28/19   Caren Macadam, MD    Family History Family History  Problem Relation Age of Onset  . Depression Mother   . Lymphoma Mother 67  . Hypertension Father   . Heart attack Father 51  . Hypertension Brother   . High blood pressure Brother   . High Cholesterol Brother   . Healthy Daughter   . Colon cancer Other        aunt   . Esophageal cancer Neg Hx   . Pancreatic cancer Neg Hx   . Stomach cancer Neg Hx     Social History Social History   Tobacco Use  . Smoking status: Never Smoker  . Smokeless tobacco: Never Used  Vaping Use  . Vaping Use: Never used  Substance Use Topics  . Alcohol use:  Yes    Comment: occ  . Drug use: Never     Allergies   Patient has no known allergies.   Review of Systems Review of Systems   Physical Exam Triage Vital Signs ED Triage Vitals  Enc Vitals Group     BP 12/20/19 1308 (!) 176/72     Pulse Rate 12/20/19 1308 (!) 116     Resp 12/20/19 1308 (!) 24     Temp 12/20/19 1308 99.5 F (37.5 C)     Temp src --      SpO2 12/20/19 1308 95 %     Weight --      Height --      Head Circumference --      Peak Flow --      Pain Score 12/20/19 1307 7     Pain Loc --      Pain Edu? --      Excl. in [place]? --    No data found.  Updated Vital Signs BP (!) 176/72   Pulse (!) 116   Temp 99.5 F (37.5 C)   Resp (!) 24   LMP 12/20/2018   SpO2 95%   Visual Acuity Right Eye Distance:   Left Eye Distance:   Bilateral Distance:    Right Eye Near:   Left Eye Near:    Bilateral Near:     Physical Exam Vitals and nursing note reviewed.  Constitutional:      General: She is not in acute distress.    Appearance: She is well-developed. She is not ill-appearing.  HENT:     Head: Normocephalic and atraumatic.     Mouth/Throat:     Mouth: Mucous membranes are moist.     Pharynx: No oropharyngeal exudate or posterior oropharyngeal erythema.     Comments: Mild postnasal drip Eyes:     Extraocular Movements: Extraocular movements intact.     Conjunctiva/sclera: Conjunctivae normal.     Pupils: Pupils are equal, round, and reactive to light.  Cardiovascular:     Rate and Rhythm: Regular rhythm. Tachycardia present.     Heart sounds: No murmur heard.  Pulmonary:     Effort: Pulmonary effort is normal. No respiratory distress.     Breath sounds: Normal breath sounds. No stridor. No wheezing, rhonchi or rales.     Comments: Moving air well through all fields.  No accessory muscle use.  Saturating at 95% on room air, patient reported this is baseline and good for her.  Respiratory rate 18-20 when not coughing with calm  breathing. Abdominal:     Palpations: Abdomen is soft.     Tenderness: There is no abdominal tenderness.  Musculoskeletal:     Cervical back: Neck supple.     Right lower leg: No edema.     Left lower leg: No edema.  Skin:    General: Skin is warm and dry.  Neurological:     General: No focal deficit present.     Mental Status: She is alert and oriented to person, place, and time.      UC Treatments / Results  Labs (all labs ordered are listed, but only abnormal results are displayed) Labs Reviewed - No data to display  EKG   Radiology DG Chest 2 View  Result Date: 12/20/2019 CLINICAL DATA:  Patient is currently coughing, laryngitis, deep barking dry cough, sick for 6 days, no fever. Negative covid test 3 days ago. EXAM: CHEST - 2 VIEW COMPARISON:  Chest radiograph 12/18/2019 FINDINGS: Stable cardiomediastinal contours. Chronically elevated right hemidiaphragm. There are a few mild opacities at the right lung base likely atelectasis or scarring. No new focal opacity. No pneumothorax or pleural effusion. No acute finding in the visualized skeleton. IMPRESSION: Mild opacities at the right lung base likely atelectasis or scarring. No evidence of active disease. Electronically Signed   By: Audie Pinto M.D.   On: 12/20/2019 14:26    Procedures Procedures (including critical care time)  Medications Ordered in UC Medications - No data to display  Initial Impression / Assessment and Plan / UC Course  I have reviewed the triage vital signs and the nursing notes.  Pertinent labs & imaging results that were available during my care of the patient were reviewed by me and considered in my medical decision making (see chart for details).     #Viral URI with cough Patient is a 54 year old presenting with continued cough recent viral respiratory illness.  Vital signs are reassuring, patient has known pulse rate near recorded today.  Respiratory rate better on my exam.  95% is  around patient's baseline.  Currently on prednisone and doxycycline.  Lung exam reassuring.  No active disease on chest x-ray.  Will not add to her current antibiotic therapy.  We will add promethazine DM for cough suppression at night.  Recommend Mucinex.  Patient has Tessalon at home recommend continue use of this.  Discussed that she needs to follow-up with her primary care and pulmonologist on Monday.  Discussed emergency department precautions.  Patient verbalized agreement understanding plan of care. Final Clinical Impressions(s) / UC Diagnoses   Final diagnoses:  Viral URI with cough     Discharge Instructions     Continue medications prescribed by your PCP  Take the promethazine DM at night, this will make you sleepy, do not drive, drink alcohol or operate machinery within 8 hours  Take mucinex two times a day   follow-up with your primary care on Monday and pulmonologist  If severely worsening symptoms such as shortness of breath, chest pain or high fevers or other concerning symptoms, go to the emergency department  ED Prescriptions    Medication Sig Dispense Auth. Provider   promethazine-dextromethorphan (PROMETHAZINE-DM) 6.25-15 MG/5ML syrup Take 2.5 mLs by mouth at bedtime for 10 days. 25 mL Puneet Masoner, Marguerita Beards, PA-C   guaiFENesin (MUCINEX) 600 MG 12 hr tablet Take 1 tablet (600 mg total) by mouth 2 (two) times daily for 10 days. 20 tablet Shahed Yeoman, Marguerita Beards, PA-C     I have reviewed the PDMP during this encounter.   Purnell Shoemaker, PA-C 12/20/19 1458

## 2019-12-21 ENCOUNTER — Ambulatory Visit (HOSPITAL_COMMUNITY): Payer: Self-pay

## 2019-12-22 NOTE — Telephone Encounter (Signed)
Called spoke with patient. Patient voiced concern that her chart reads as she was seeking medication for her appointment on 12/18/19 and she was not. She was following the recommendations of her primary care doctor. She was told there was an opening, it was not urgent but since she was told there was an opening she took it. This way she could have verification that there wasn't something worse going on in her lungs. - Patient would like this statement removed from the note   " She then scheduled an appointment immediately before starting this medications with our office because she wanted additional medications to help with management of her cough"  Aaron Edelman please review or advise

## 2019-12-22 NOTE — Telephone Encounter (Signed)
Called and spoke with pt letting her know that Aaron Edelman addended the note from last OV and she verbalized understanding. Nothing further needed.

## 2019-12-22 NOTE — Telephone Encounter (Signed)
Note has been addended.  Wyn Quaker, FNP

## 2020-01-12 ENCOUNTER — Encounter: Payer: Self-pay | Admitting: Family Medicine

## 2020-01-12 ENCOUNTER — Encounter: Payer: Self-pay | Admitting: Gastroenterology

## 2020-01-13 ENCOUNTER — Other Ambulatory Visit: Payer: Self-pay | Admitting: Family Medicine

## 2020-01-13 MED ORDER — BUPROPION HCL ER (XL) 150 MG PO TB24
150.0000 mg | ORAL_TABLET | Freq: Every day | ORAL | 1 refills | Status: DC
Start: 2020-01-13 — End: 2020-05-12

## 2020-01-19 ENCOUNTER — Encounter: Payer: Self-pay | Admitting: Pulmonary Disease

## 2020-01-19 ENCOUNTER — Institutional Professional Consult (permissible substitution): Payer: 59 | Admitting: Pulmonary Disease

## 2020-01-19 ENCOUNTER — Ambulatory Visit (INDEPENDENT_AMBULATORY_CARE_PROVIDER_SITE_OTHER): Payer: 59 | Admitting: Pulmonary Disease

## 2020-01-19 ENCOUNTER — Other Ambulatory Visit: Payer: Self-pay

## 2020-01-19 VITALS — BP 128/82 | HR 100 | Temp 97.8°F | Ht 65.0 in | Wt 272.6 lb

## 2020-01-19 DIAGNOSIS — J984 Other disorders of lung: Secondary | ICD-10-CM

## 2020-01-19 DIAGNOSIS — J986 Disorders of diaphragm: Secondary | ICD-10-CM

## 2020-01-19 DIAGNOSIS — E662 Morbid (severe) obesity with alveolar hypoventilation: Secondary | ICD-10-CM | POA: Diagnosis not present

## 2020-01-19 DIAGNOSIS — G4733 Obstructive sleep apnea (adult) (pediatric): Secondary | ICD-10-CM

## 2020-01-19 DIAGNOSIS — Z23 Encounter for immunization: Secondary | ICD-10-CM

## 2020-01-19 DIAGNOSIS — G709 Myoneural disorder, unspecified: Secondary | ICD-10-CM

## 2020-01-19 NOTE — Patient Instructions (Signed)
Will arrange for CPAP titration sleep study Will call to arrange for follow up after sleep study reviewed

## 2020-01-19 NOTE — Progress Notes (Signed)
[facility]  Chief Complaint  Patient presents with   Consult    pt states snoring.pt has had a sleep study    Constitutional:  BP 128/82 (BP Location: Left Arm, Cuff Size: Normal)    Pulse 100    Temp 97.8 F (36.6 C) (Oral)    Ht 5\' 5"  (1.651 m)    Wt 272 lb 9.6 oz (123.7 kg)    SpO2 91%    BMI 45.36 kg/m   Past Medical History:  Depression, GERD, HTN, Migraine Headaches  Past Surgical History:  Her  has a past surgical history that includes Cesarean section (1994); Knee arthroscopy (Left, 2013); Tennis elbow release/nirschel procedure (2009); Dilation and curettage of uterus; Wisdom tooth extraction; laparoscopic appendectomy (N/A, 12/08/2018); and Breast reduction surgery (Bilateral, 06/12/2019).  Brief Summary:  Alyssa Clay is a 54 y.o. female with obstructive sleep apnea and right diaphragm dysfunction.      Subjective:   She is followed by Dr. Vaughan Browner for asthma.  She had sleep study in October 2020.  Showed mild sleep apnea.  She was started on supplemental oxygen after surgery because she had hard time recovering from anesthesia.  She uses oxygen at night when she feels like she needs it.  She has a smart watch that records her SpO2 at night.  In review of this she is still having frequent oxygen desaturation even on nights she uses oxygen.  She has known about issue with her diaphragm for years.  No explanation about what triggered this.  She started an exercise regimen 3 months ago.  Her stamina seems to have improved, but she hasn't lost much weight.  She is not having cough, wheeze, or sputum.  Hasn't needed albuterol recently except as pretreatment before she does cardio routine.  She uses trazadone at 11 pm.  Falls asleep in 30 minutes.  Wakes up at 7 am.  Sleeps through the night.  She snores some and will grind her teeth.  Gets headaches during the day.  Epworth score is 9 out of 24.  Physical Exam:    Appearance - well kempt   ENMT - no sinus tenderness, no oral exudate, no LAN, Mallampati 3 airway, no stridor  Respiratory - equal breath sounds bilaterally, no wheezing or rales  CV - s1s2 regular rate and rhythm, no murmurs  Ext - no clubbing, no edema  Skin - no rashes  Psych - normal mood and affect   Pulmonary testing:   PFT 03/03/19 >> FEV1 1.57 (51%), FEV1% 85, TLC 3.46 (63%), DLCO 83%  Chest Imaging:   CT chest 06/23/10 >> elevation of Rt diaphragm  CT angio chest 01/02/19 >> elevation of Rt diaphragm  Sleep Tests:   HST 02/01/19 >> AHI 11, SpO2 low 72%  Cardiac Tests:   Echo 11/21/19 >> EF 60 to 65%, mod LVH  Social History:  She  reports that she has never smoked. She has never used smokeless tobacco. She reports current alcohol use. She reports that she does not use drugs.  Family History:  Her family history includes Colon cancer in an other family member; Depression in her mother; Healthy in her daughter; Heart attack (age of onset: 38) in her father; High Cholesterol in her brother; High blood pressure in her brother; Hypertension in her brother and father; Lymphoma (age of onset: 66) in her mother.    Discussion:  She has mild obstructive sleep apnea on recent sleep study.  She  has right diaphragm dysfunction.  She is morbidly obese with a BMI of 45.36.  She has history of hypertension.  Her previous PFT showed restrictive defect.  Her smart watch showed low SpO2 at night even when she has been using supplemental oxygen at night.  I am concerned she could have hypoventilation in addition to obstructive sleep apnea.  Assessment/Plan:   Obstructive sleep apnea. - reviewed her sleep study with her - discussed how untreated sleep apnea can impact her health - treatment options reviewed - will need to arrange for in lab CPAP titration study and then determine if she needs CPAP versus Bipap +/- supplemental oxygen  Restrictive lung disease. - from right  diaphragm dysfunction and obesity - discussed importance of weight loss and encouraged her to keep up with her exercise regimen  Severe, persistent asthma with eosinophilic phenotype. - well controlled at this on fasenra and symbicort - followed by Dr. Vaughan Browner  Time Spent Involved in Patient Care on Day of Examination:  44 minutes  Follow up:  Patient Instructions  Will arrange for CPAP titration sleep study Will call to arrange for follow up after sleep study reviewed    Medication List:   Allergies as of 01/19/2020   No Known Allergies     Medication List       Accurate as of January 19, 2020  9:56 AM. If you have any questions, ask your nurse or doctor.        STOP taking these medications   benzonatate 100 MG capsule Commonly known as: Best boy Stopped by: Chesley Mires, MD   furosemide 20 MG tablet Commonly known as: LASIX Stopped by: Chesley Mires, MD   mupirocin ointment 2 % Commonly known as: BACTROBAN Stopped by: Chesley Mires, MD     TAKE these medications   albuterol (2.5 MG/3ML) 0.083% nebulizer solution Commonly known as: PROVENTIL Take 2.5 mg by nebulization every 6 (six) hours as needed for wheezing or shortness of breath.   albuterol 108 (90 Base) MCG/ACT inhaler Commonly known as: VENTOLIN HFA Inhale 2 puffs into the lungs every 6 (six) hours as needed for wheezing or shortness of breath.   budesonide-formoterol 160-4.5 MCG/ACT inhaler Commonly known as: Symbicort Inhale 2 puffs into the lungs 2 (two) times daily.   buPROPion 150 MG 24 hr tablet Commonly known as: Wellbutrin XL Take 1 tablet (150 mg total) by mouth daily.   calcium carbonate 1500 (600 Ca) MG Tabs tablet Commonly known as: OSCAL Take 600 mg of elemental calcium by mouth 2 (two) times daily with a meal.   cyclobenzaprine 10 MG tablet Commonly known as: FLEXERIL Take 10 mg by mouth 3 (three) times daily as needed (migraines).   DULoxetine 60 MG capsule Commonly  known as: CYMBALTA Take 1 capsule (60 mg total) by mouth daily.   Fasenra 30 MG/ML Sosy Generic drug: Benralizumab SECOND SHIP: INJECT ONE SYRINGE UNDER THE SKIN AT WEEK 4 AND 8, THEN EVERY 8 WEEKS THEREAFTER. What changed: See the new instructions.   Flovent HFA 110 MCG/ACT inhaler Generic drug: fluticasone Inhale 2 puffs into the lungs 2 (two) times daily. During respiratory infections/asthma flares.   Flutter Devi Use 3 times daily as directed   gabapentin 300 MG capsule Commonly known as: NEURONTIN Take 300-900 mg by mouth See admin instructions. Take 1 tablet every AM, three tablets at bedtime   losartan 50 MG tablet Commonly known as: COZAAR TAKE 1 TABLET BY MOUTH EVERY DAY   montelukast 10 MG tablet  Commonly known as: SINGULAIR Take 1 tablet (10 mg total) by mouth daily.   multivitamin with minerals Tabs tablet Take 1 tablet by mouth daily.   naproxen 500 MG tablet Commonly known as: NAPROSYN Take 1 tablet (500 mg total) by mouth 2 (two) times daily with a meal.   Nurtec 75 MG Tbdp Generic drug: Rimegepant Sulfate Take by mouth.   pantoprazole 40 MG tablet Commonly known as: PROTONIX TAKE 1 TABLET BY MOUTH 2 TIMES DAILY BEFORE A MEAL.   rizatriptan 10 MG tablet Commonly known as: MAXALT TAKE 1 TABLET BY MOUTH ONCE AS NEEDED FOR MIGRAINE FOR UP TO 1 DOSE. MAY REPEAT IN 2 HOURS IF NEEDED   tiZANidine 4 MG tablet Commonly known as: ZANAFLEX Take 4 mg by mouth every 6 (six) hours as needed for muscle spasms.   traZODone 100 MG tablet Commonly known as: DESYREL TAKE 1-2 TABLETS (100-200 MG TOTAL) BY MOUTH AT BEDTIME AS NEEDED FOR SLEEP.   valACYclovir 1000 MG tablet Commonly known as: VALTREX Take 1 tablet (1,000 mg total) by mouth 2 (two) times daily. What changed:   when to take this  reasons to take this   VITAMIN B-12 PO Take by mouth.       Signature:  Chesley Mires, MD [place] Pager - [phone] 01/19/2020,  9:56 AM

## 2020-01-23 ENCOUNTER — Other Ambulatory Visit: Payer: Self-pay | Admitting: Family Medicine

## 2020-02-17 ENCOUNTER — Ambulatory Visit (INDEPENDENT_AMBULATORY_CARE_PROVIDER_SITE_OTHER): Payer: 59 | Admitting: Internal Medicine

## 2020-02-17 ENCOUNTER — Ambulatory Visit (INDEPENDENT_AMBULATORY_CARE_PROVIDER_SITE_OTHER): Payer: 59

## 2020-02-17 ENCOUNTER — Other Ambulatory Visit: Payer: Self-pay

## 2020-02-17 ENCOUNTER — Encounter: Payer: Self-pay | Admitting: Family Medicine

## 2020-02-17 ENCOUNTER — Encounter: Payer: Self-pay | Admitting: Internal Medicine

## 2020-02-17 VITALS — BP 128/72 | HR 100 | Temp 99.0°F | Ht 65.0 in | Wt 266.0 lb

## 2020-02-17 DIAGNOSIS — M79671 Pain in right foot: Secondary | ICD-10-CM

## 2020-02-17 IMAGING — DX DG ANKLE COMPLETE 3+V*R*
3 series · 3 of 3 positions shown · non-contrast
Comparison: None.

CLINICAL DATA: Pain

EXAM:
RIGHT ANKLE - COMPLETE 3+ VIEW

[ankle ap]
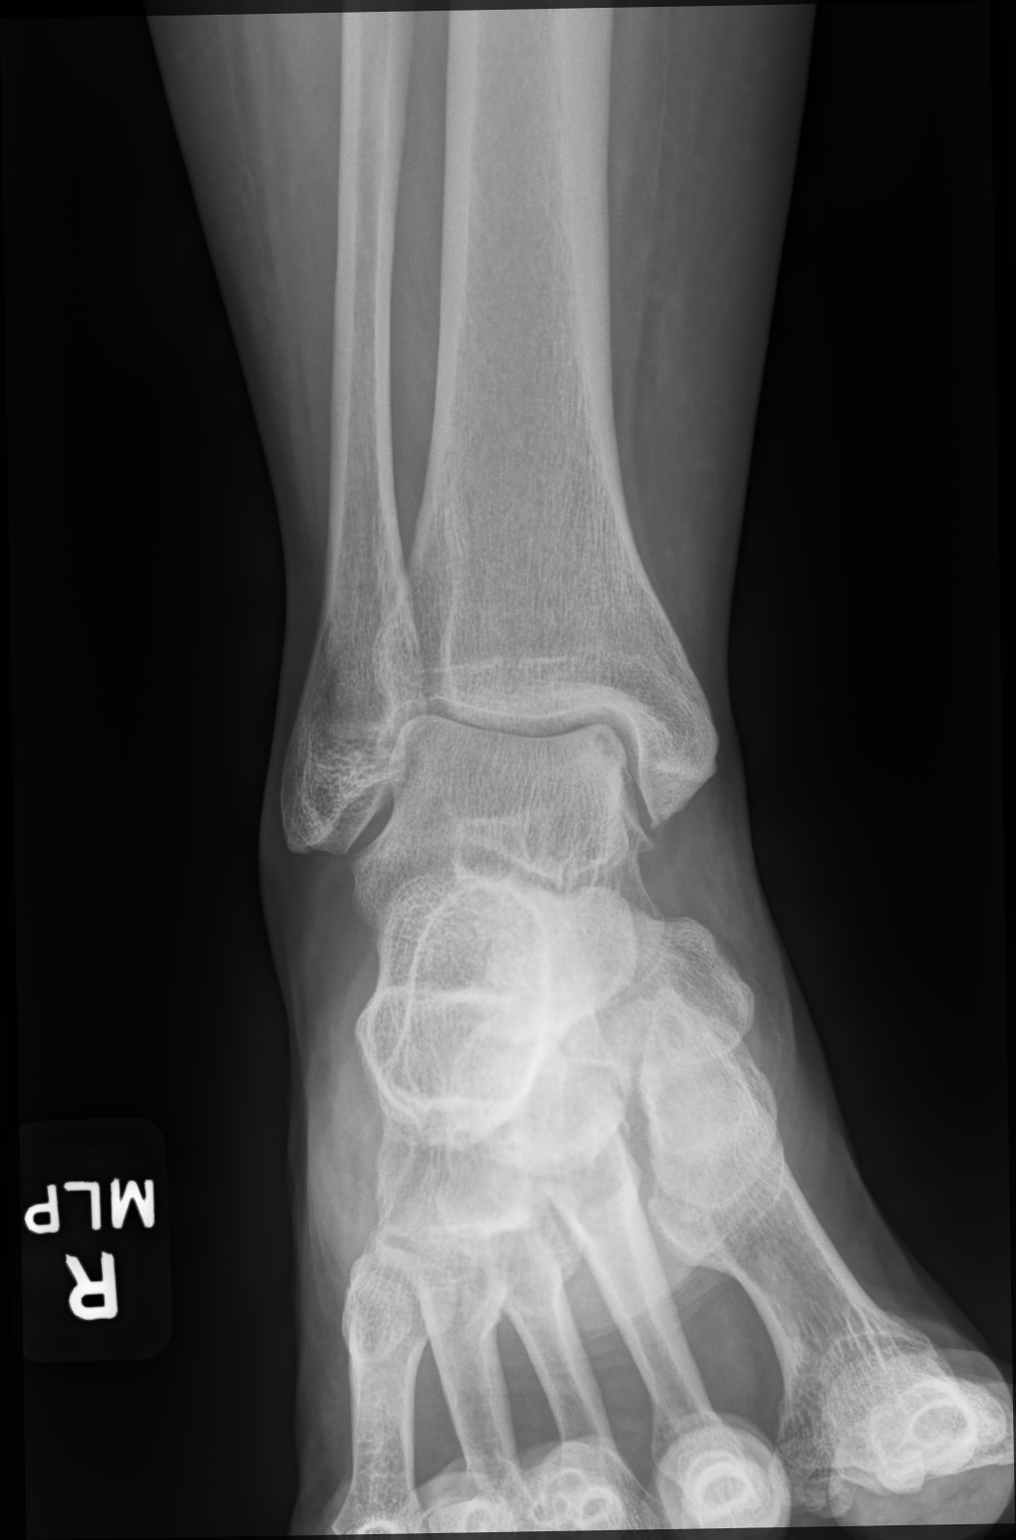

[ankle mlo]
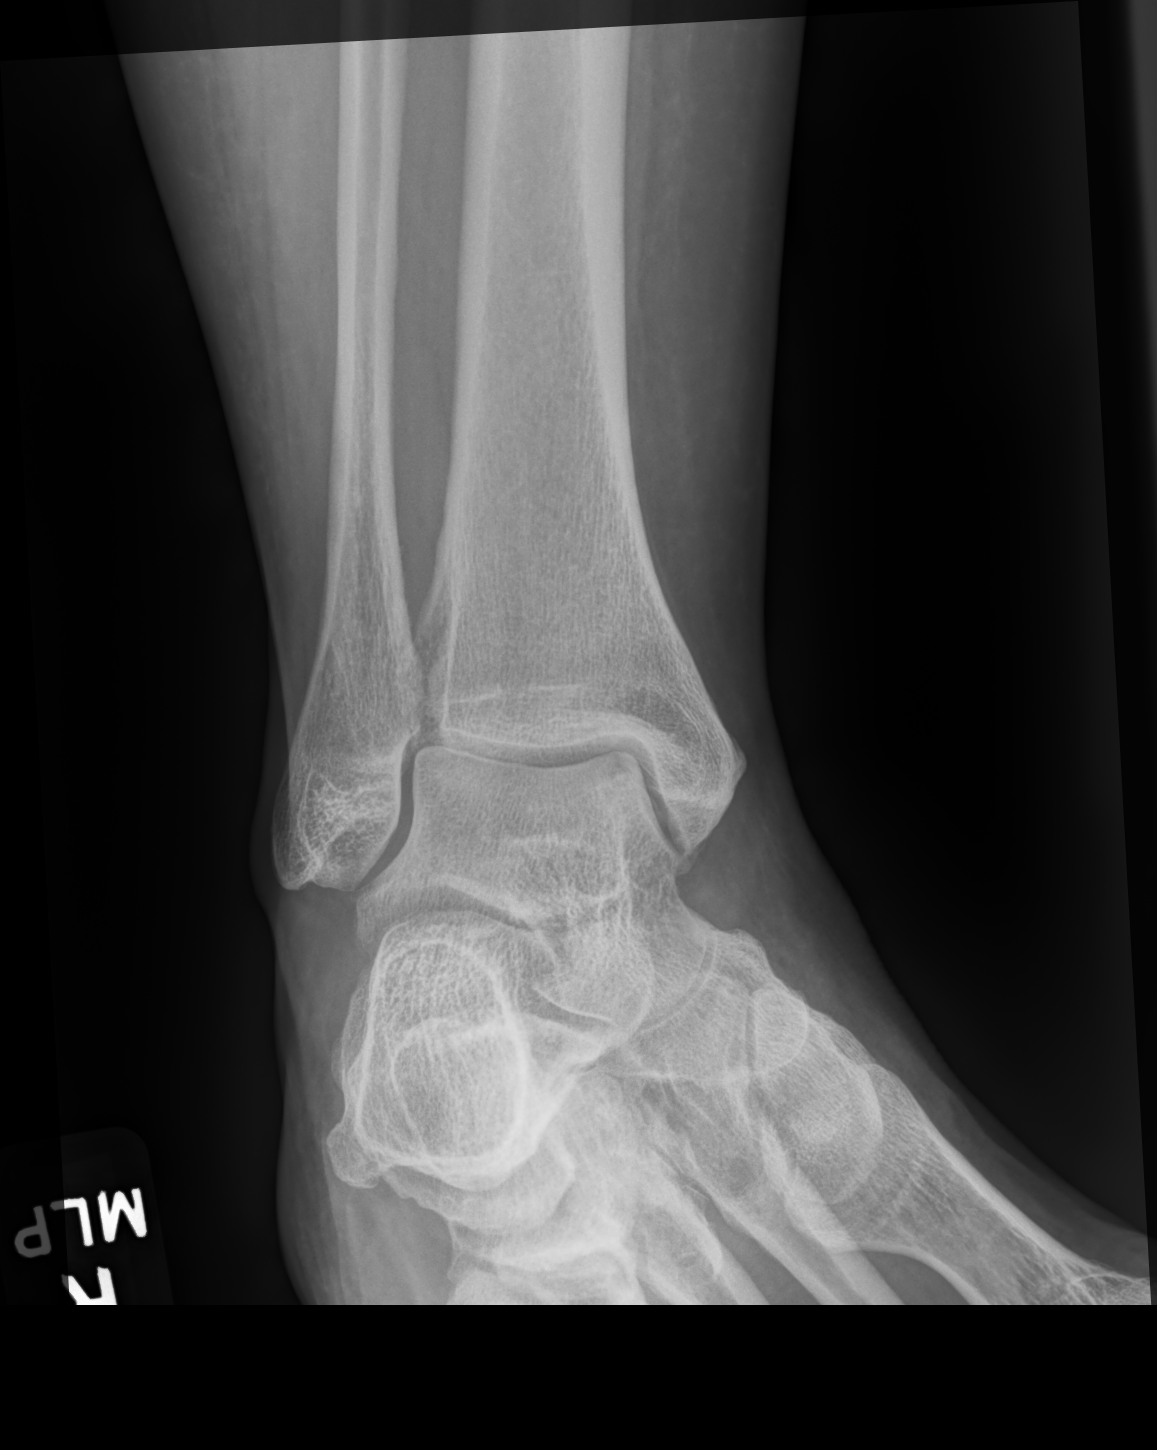

[ankle lat]
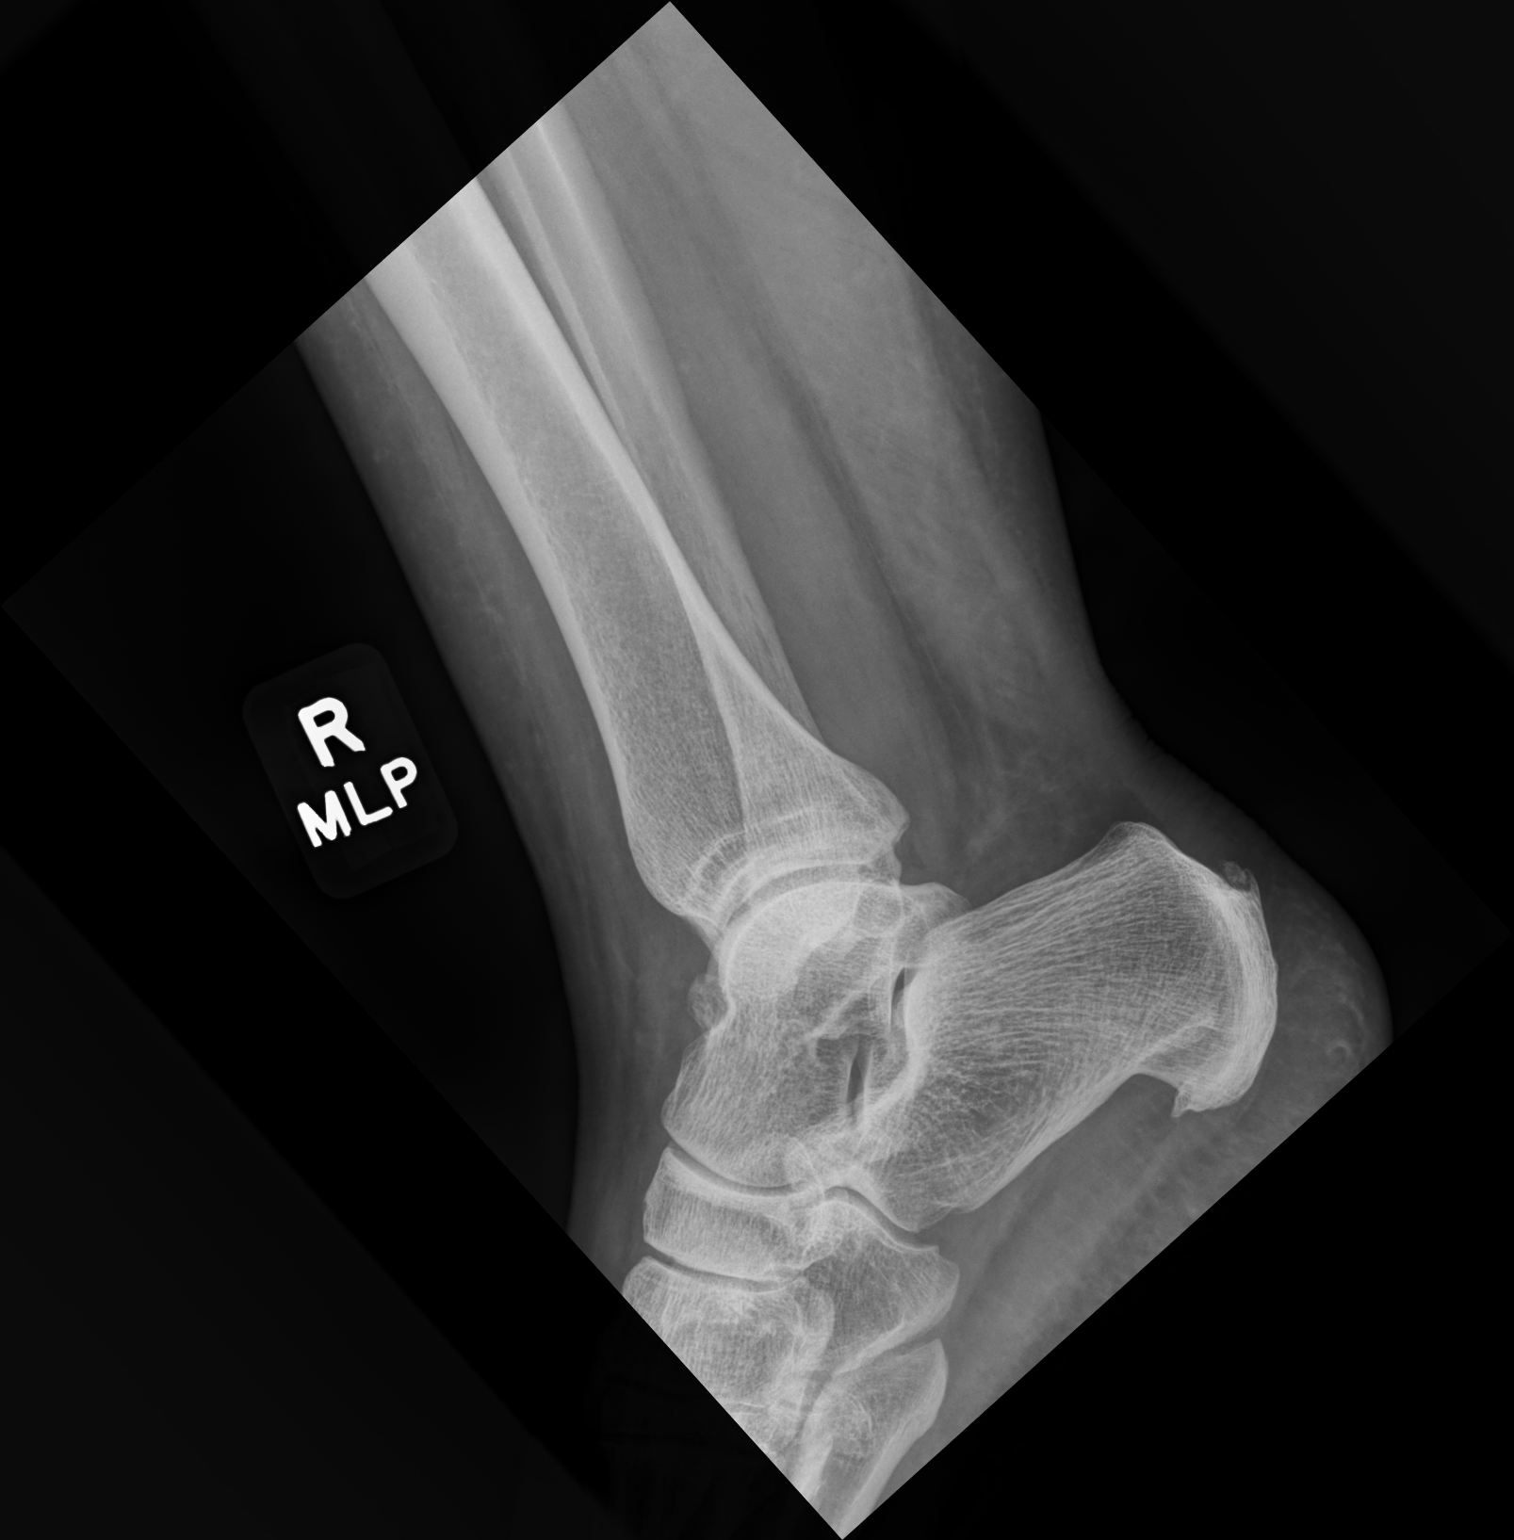

[3 of 3 positions shown; findings below may reference images not displayed]

FINDINGS: Frontal, oblique, and lateral views were obtained. There is no
appreciable fracture or joint effusion. No joint space narrowing or
erosion. There are small posterior and inferior calcaneal spurs as
well as a small focus of calcification in the distal Achilles
tendon. Ankle mortise appears intact.
IMPRESSION: Small calcaneal spurs. Focus of distal Achilles tendinosis. No
evident fracture or appreciable joint space narrowing. Ankle mortise
appears intact

## 2020-02-17 IMAGING — DX DG FOOT COMPLETE 3+V*R*
3 series · 3 of 3 positions shown · non-contrast
Comparison: None.

CLINICAL DATA: Right heel pain and palpable bump, Achilles pain

EXAM:
RIGHT FOOT COMPLETE - 3+ VIEW

[foot ap]
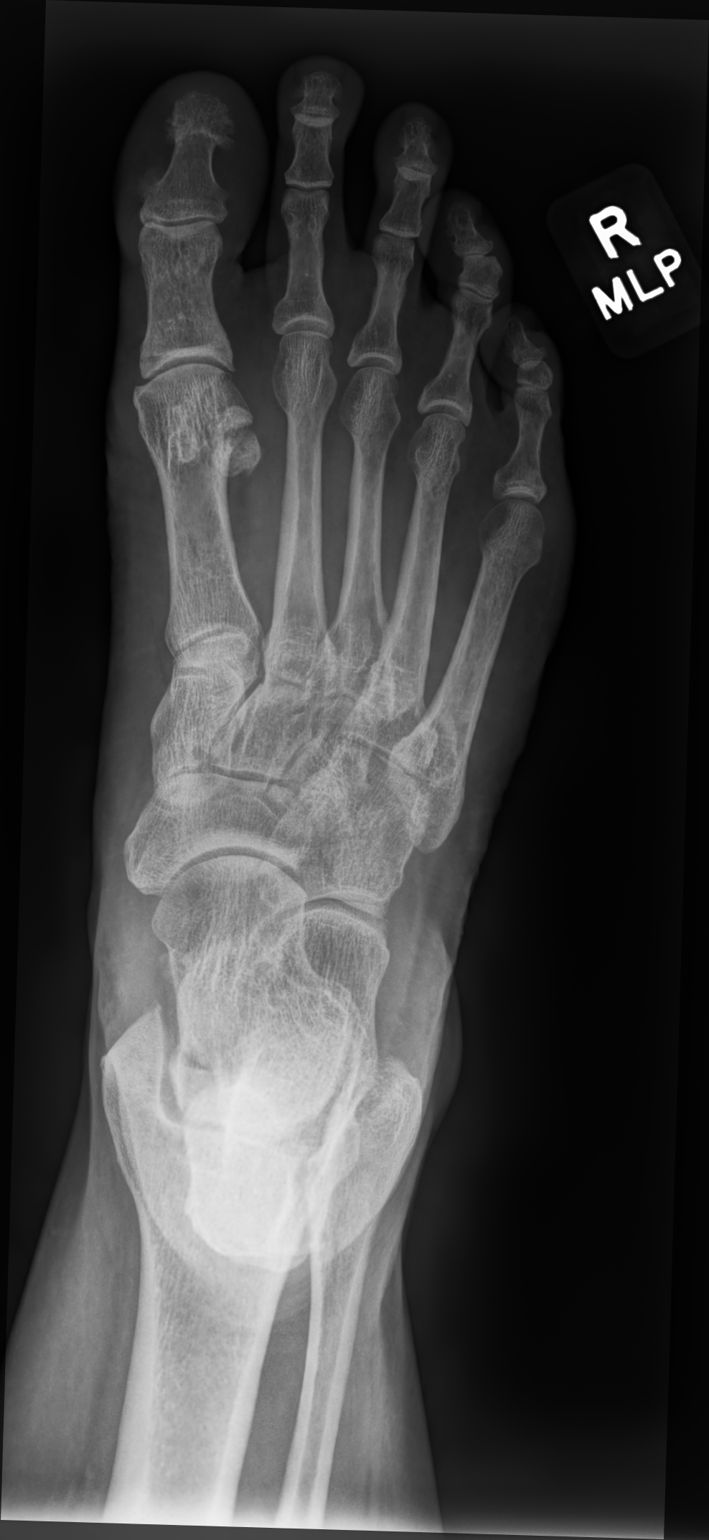

[foot mlo]
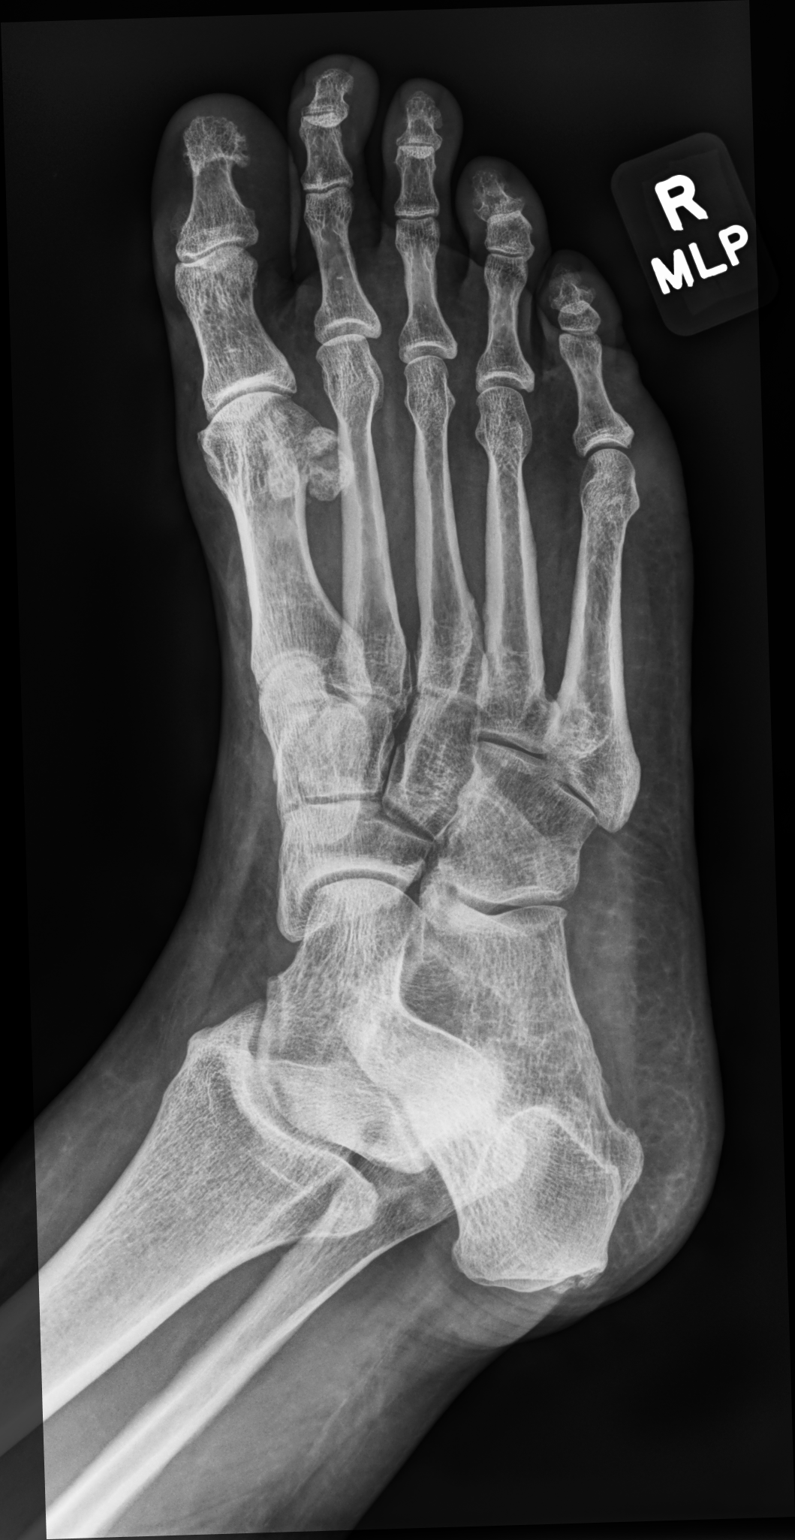

[foot lat]
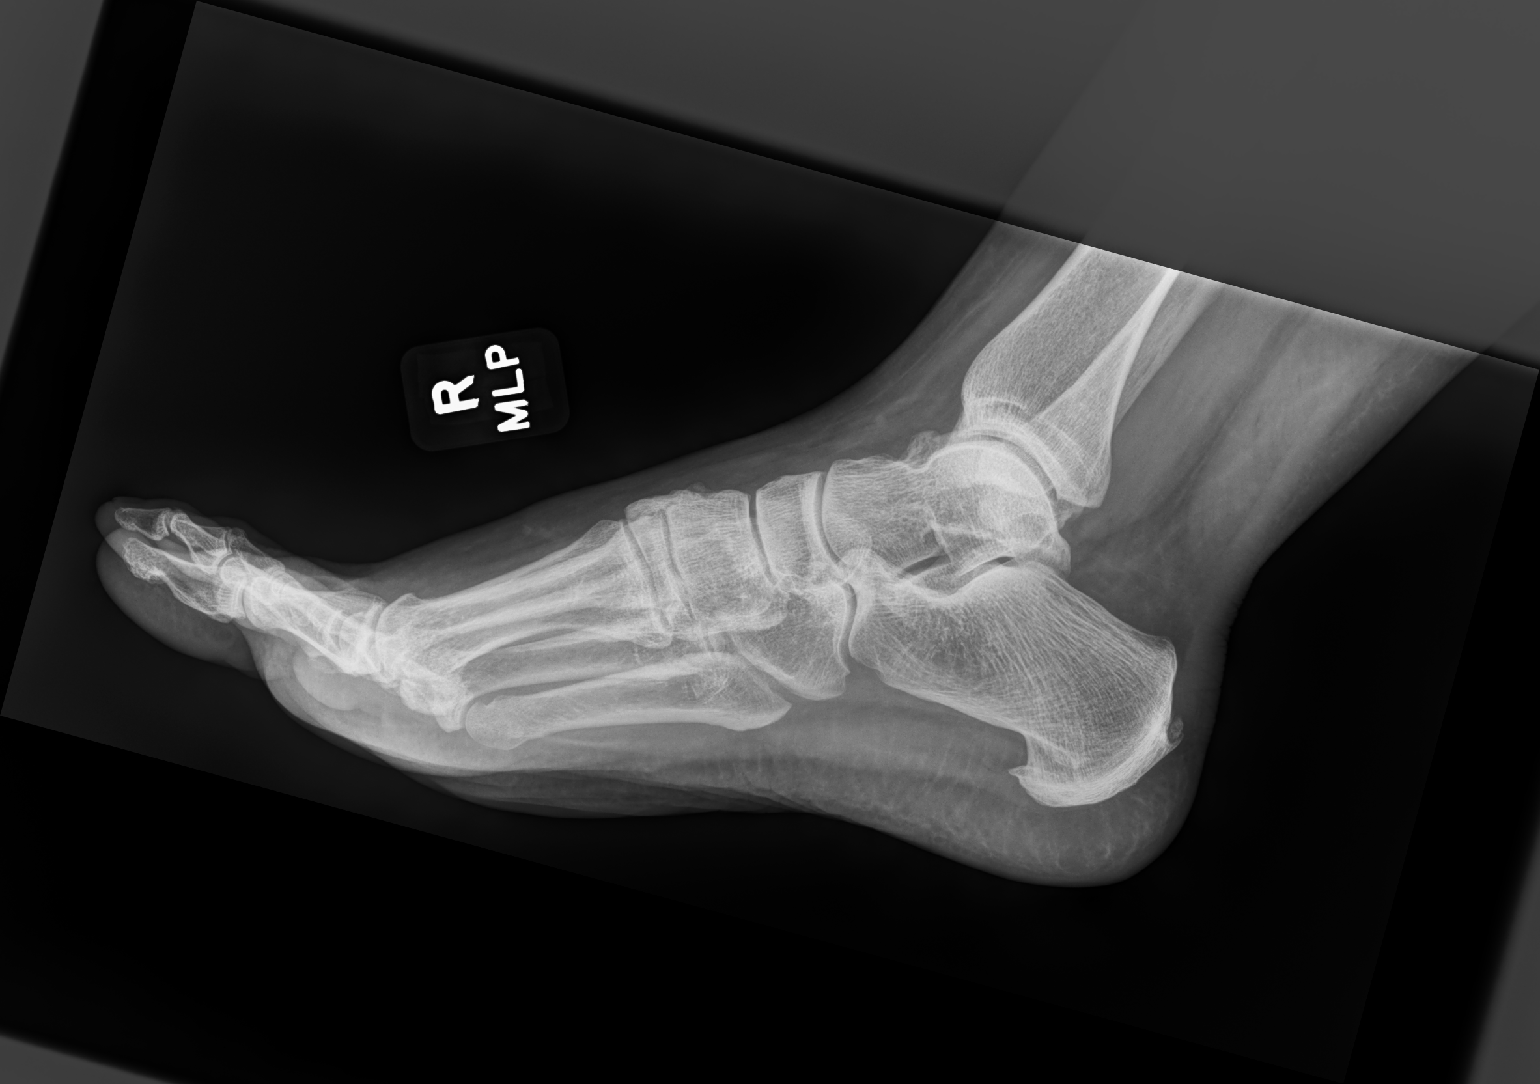

[3 of 3 positions shown; findings below may reference images not displayed]

FINDINGS: The osseous structures appear diffusely demineralized which may
limit detection of small or nondisplaced fractures. No acute bony
abnormality. Specifically, no fracture, subluxation, or dislocation
within the limitations of this nonweightbearing exam. Bidirectional
calcaneal spurring is noted. No significant thickening or
irregularity of the Achilles tendon. No abnormal stranding within
Kager's fat pad. Additional mild degenerative changes noted
throughout the foot and ankle. Bipartite lateral hallux sesamoid
with sclerotic appearance may reflect sequela of prior sesamoiditis.
IMPRESSION: 1. No acute bony abnormality.
2. Bidirectional calcaneal spurring.
3. Bipartite lateral hallux sesamoid with sclerotic appearance may
reflect sequela of prior sesamoiditis.
4. Additional mild degenerative changes in the foot diffusely.

## 2020-02-17 NOTE — Patient Instructions (Signed)
Acts like achilles infalammation and heel spur  Bursitis area.   X ray today  Relative rest and  Ice after activity  Antiinflammatory topical ok  Or oral ibuprofen . May take weeks to  Get better .  Fu with PCP or sport medicine in 2 -3 weeks    Rosen's Emergency Medicine: Concepts and Clinical Practice (9th ed., pp. 3151-7616). [place], [place]: [place]. Retrieved from https://www.clinicalkey.com/#!/content/book/[email or web address]">  Achilles Tendinitis  Achilles tendinitis is inflammation of the tough, cord-like band that attaches the lower leg muscles to the heel bone (Achilles tendon). This is usually caused by overusing the tendon and the ankle joint. Achilles tendinitis usually gets better over time with treatment and caring for yourself at home. It can take weeks or months to heal completely. What are the causes? This condition may be caused by:  A sudden increase in exercise or activity, such as running.  Doing the same exercises or activities, such as jumping, over and over.  Not warming up calf muscles before exercising.  Exercising in shoes that are worn out or not made for exercise.  Having arthritis or a bone growth (spur) on the back of the heel bone. This can rub against the tendon and hurt it.  Age-related wear and tear. Tendons become less flexible with age and are more likely to be injured. What are the signs or symptoms? Common symptoms of this condition include:  Pain in the Achilles tendon or in the back of the leg, just above the heel. The pain usually gets worse with exercise.  Stiffness or soreness in the back of the leg, especially in the morning.  Swelling of the skin over the Achilles tendon.  Thickening of the tendon.  Trouble standing on tiptoe. How is this diagnosed? This condition is diagnosed based on your symptoms and a physical exam. You may have tests, including:  X-rays.  MRI. How is this  treated? The goal of treatment is to relieve symptoms and help your injury heal. Treatment may include:  Decreasing or stopping activities that caused the tendinitis. This may mean switching to low-impact exercises like biking or swimming.  Icing the injured area.  Doing physical therapy, including strengthening and stretching exercises.  Taking NSAIDs, such as ibuprofen, to help relieve pain and swelling.  Using supportive shoes, wraps, heel lifts, or a walking boot (air cast).  Having surgery. This may be done if your symptoms do not improve after other treatments.  Using high-energy shock wave impulses to stimulate the healing process (extracorporeal shock wave therapy). This is rare.  Having an injection of medicines that help relieve inflammation (corticosteroids). This is rare. Follow these instructions at home: If you have an air cast:  Wear the air cast as told by your health care provider. Remove it only as told by your health care provider.  Loosen it if your toes tingle, become numb, or turn cold and blue.  Keep it clean.  If the air cast is not waterproof: ? Do not let it get wet. ? Cover it with a watertight covering when you take a bath or shower. Managing pain, stiffness, and swelling   If directed, put ice on the injured area. To do this: ? If you have a removable air cast, remove it as told by your health care provider. ? Put ice in a plastic bag. ? Place a towel between your skin and the bag. ? Leave the ice on for 20 minutes, 2-3 times a day.  Move your toes  often to reduce stiffness and swelling.  Raise (elevate) your foot above the level of your heart while you are sitting or lying down. Activity  Gradually return to your normal activities as told by your health care provider. Ask your health care provider what activities are safe for you.  Do not do activities that cause pain.  Consider doing low-impact exercises, like cycling or swimming.  Ask  your health care provider when it is safe to drive if you have an air cast on your foot.  If physical therapy was prescribed, do exercises as told by your health care provider or physical therapist. General instructions  If directed, wrap your foot with an elastic bandage or other wrap. This can help to keep your tendon from moving too much while it heals. Your health care provider will show you how to wrap your foot correctly.  Wear supportive shoes or heel lifts only as told by your health care provider.  Take over-the-counter and prescription medicines only as told by your health care provider.  Keep all follow-up visits as told by your health care provider. This is important. Contact a health care provider if you:  Have symptoms that get worse.  Have pain that does not get better with medicine.  Develop new, unexplained symptoms.  Develop warmth and swelling in your foot.  Have a fever. Get help right away if you:  Have a sudden popping sound or sensation in your Achilles tendon followed by severe pain.  Cannot move your toes or foot.  Cannot put any weight on your foot.  Your foot or toes become numb and look white or blue even after loosening your bandage or air cast. Summary  Achilles tendinitis is inflammation of the tough, cord-like band that attaches the lower leg muscles to the heel bone (Achilles tendon).  This condition is usually caused by overusing the tendon and the ankle joint. It can also be caused by arthritis or normal aging.  The most common symptoms of this condition include pain, swelling, or stiffness in the Achilles tendon or in the back of the leg.  This condition is usually treated by decreasing or stopping activities that caused the tendinitis, icing the injured area, taking NSAIDs, and doing physical therapy. This information is not intended to replace advice given to you by your health care provider. Make sure you discuss any questions you have  with your health care provider. Document Revised: 08/26/2018 Document Reviewed: 08/26/2018 Elsevier Patient Education  [place].

## 2020-02-17 NOTE — Progress Notes (Signed)
Mild arthritis  in foot buy dont think causing your pain.at this time

## 2020-02-17 NOTE — Progress Notes (Signed)
X ray consistent with  achilles Tendinis and heel spur    No other ares of concern

## 2020-02-17 NOTE — Progress Notes (Signed)
Chief Complaint  Patient presents with  . Ankle Pain    right ankle pain, hurts to walk with shoes, hurts to touch.   Got worse 3 weeks ago. Took Motrin for pain    HPI: Alyssa Clay 54 y.o. come in for sda PCP appt NA close to 3 weeks insidious onset   Going to gyme and then hurt back of heel area   afterwoard a now worse in am and  Walking on hardwood floors.  no prev ijury  Sprinting on cardio days .   Weight bearing.  5/7 days.  Hours.  No rx . So far   ROS: See pertinent positives and negatives per HPI.  Past Medical History:  Diagnosis Date  . Asthma   . Depression   . 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 FUO (fever of unknown origin) 10/21/2019  . GERD (gastroesophageal reflux disease)   . Hypertension   . Migraine     Family History  Problem Relation Age of Onset  . Depression Mother   . Lymphoma Mother 84  . Hypertension Father   . Heart attack Father 89  . Hypertension Brother   . High blood pressure Brother   . High Cholesterol Brother   . Healthy Daughter   . Colon cancer Other        aunt   . Esophageal cancer Neg Hx   . Pancreatic cancer Neg Hx   . Stomach cancer Neg Hx     Social History   Socioeconomic History  . Marital status: Married    Spouse name: Not on file  . Number of children: Not on file  . Years of education: Not on file  . Highest education level: Not on file  Occupational History  . 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 Smoking status: Never Smoker  . Smokeless tobacco: Never Used  Vaping Use  . Vaping Use: Never used  Substance and Sexual Activity  . Alcohol use: Yes    Comment: occ  . Drug use: Never  . Sexual activity: Yes    Partners: Male  Other Topics Concern  . Not on file  Social History Narrative  . Not on file   Social Determinants of Health   Financial Resource Strain:   . Difficulty of Paying Living Expenses: Not on file  Food Insecurity:   . Worried About Charity fundraiser  in the Last Year: Not on file  . Ran Out of Food in the Last Year: Not on file  Transportation Needs:   . Lack of Transportation (Medical): Not on file  . Lack of Transportation (Non-Medical): Not on file  Physical Activity:   . Days of Exercise per Week: Not on file  . Minutes of Exercise per Session: Not on file  Stress:   . Feeling of Stress : Not on file  Social Connections:   . Frequency of Communication with Friends and Family: Not on file  . Frequency of Social Gatherings with Friends and Family: Not on file  . Attends Religious Services: Not on file  . Active Member of Clubs or Organizations: Not on file  . Attends Archivist Meetings: Not on file  . Marital Status: Not on file    Outpatient Medications Prior to Visit  Medication Sig Dispense Refill  . albuterol (PROVENTIL) (2.5 MG/3ML) 0.083% nebulizer solution Take 2.5 mg by nebulization every 6 (six) hours as needed for wheezing or  shortness of breath.    Marland Kitchen albuterol (VENTOLIN HFA) 108 (90 Base) MCG/ACT inhaler Inhale 2 puffs into the lungs every 6 (six) hours as needed for wheezing or shortness of breath. 18 g 2  . budesonide-formoterol (SYMBICORT) 160-4.5 MCG/ACT inhaler Inhale 2 puffs into the lungs 2 (two) times daily. 1 Inhaler 5  . buPROPion (WELLBUTRIN XL) 150 MG 24 hr tablet Take 1 tablet (150 mg total) by mouth daily. 90 tablet 1  . calcium carbonate (OSCAL) 1500 (600 Ca) MG TABS tablet Take 600 mg of elemental calcium by mouth 2 (two) times daily with a meal.    . Cyanocobalamin (VITAMIN B-12 PO) Take by mouth.    . cyclobenzaprine (FLEXERIL) 10 MG tablet Take 10 mg by mouth 3 (three) times daily as needed (migraines).    . DULoxetine (CYMBALTA) 60 MG capsule TAKE 1 CAPSULE BY MOUTH EVERY DAY 90 capsule 1  . FASENRA 30 MG/ML SOSY SECOND SHIP: INJECT ONE SYRINGE UNDER THE SKIN AT WEEK 4 AND 8, THEN EVERY 8 WEEKS THEREAFTER. (Patient taking differently: Inject 30 mg into the skin See admin instructions. Every  8 weeks) 1 Syringe 8  . fluticasone (FLOVENT HFA) 110 MCG/ACT inhaler Inhale 2 puffs into the lungs 2 (two) times daily. During respiratory infections/asthma flares. 1 Inhaler 5  . gabapentin (NEURONTIN) 300 MG capsule Take 300-900 mg by mouth See admin instructions. Take 1 tablet every AM, three tablets at bedtime    . losartan (COZAAR) 50 MG tablet TAKE 1 TABLET BY MOUTH EVERY DAY 90 tablet 1  . montelukast (SINGULAIR) 10 MG tablet Take 1 tablet (10 mg total) by mouth daily. 30 tablet 5  . Multiple Vitamin (MULTIVITAMIN WITH MINERALS) TABS tablet Take 1 tablet by mouth daily.    . naproxen (NAPROSYN) 500 MG tablet Take 1 tablet (500 mg total) by mouth 2 (two) times daily with a meal. 30 tablet 0  . pantoprazole (PROTONIX) 40 MG tablet TAKE 1 TABLET BY MOUTH 2 TIMES DAILY BEFORE A MEAL. 60 tablet 3  . Respiratory Therapy Supplies (FLUTTER) DEVI Use 3 times daily as directed 1 each 0  . Rimegepant Sulfate (NURTEC) 75 MG TBDP Take by mouth.    . rizatriptan (MAXALT) 10 MG tablet TAKE 1 TABLET BY MOUTH ONCE AS NEEDED FOR MIGRAINE FOR UP TO 1 DOSE. MAY REPEAT IN 2 HOURS IF NEEDED    . tiZANidine (ZANAFLEX) 4 MG tablet Take 4 mg by mouth every 6 (six) hours as needed for muscle spasms.    . traZODone (DESYREL) 100 MG tablet TAKE 1-2 TABLETS (100-200 MG TOTAL) BY MOUTH AT BEDTIME AS NEEDED FOR SLEEP. 180 tablet 2  . valACYclovir (VALTREX) 1000 MG tablet Take 1 tablet (1,000 mg total) by mouth 2 (two) times daily. (Patient taking differently: Take 1,000 mg by mouth as needed. ) 18 tablet 3   No facility-administered medications prior to visit.     EXAM:  BP 128/72 (BP Location: Right Arm, Patient Position: Sitting, Cuff Size: Normal)   Pulse 100   Temp 99 F (37.2 C) (Oral)   Ht 5\' 5"  (1.651 m)   Wt 266 lb (120.7 kg)   SpO2 93%   BMI 44.26 kg/m   Body mass index is 44.26 kg/m.  GENERAL: vitals reviewed and listed above, alert, oriented, appears well hydrated and in no acute  distress HEENT: atraumatic, conjunctiva  clear, no obvious abnormalities on inspection of external nose and ears OP : masked  NECK: no obvious masses on inspection  palpation  CV: HRRR, no clubbing cyanosis or  peripheral edema nl cap refill  MS: moves all extremities right  Heel bump and tender at achilles insertion  rom ok  tender base of calcaneus.  No redness  Mild retrocalcaneal bursal puffiness  NV seems intact  PSYCH: pleasant and cooperative, no obvious depression or anxiety  BP Readings from Last 3 Encounters:  02/17/20 128/72  01/19/20 128/82  12/20/19 (!) 176/72    ASSESSMENT AND PLAN:  Discussed the following assessment and plan:  Pain of right heel - Plan: DG Ankle Complete Right, DG Foot Complete Right Poss achilles  Pump bump and or retrocalcaneal bursitis  Over use injury Relative rest  Ice topical and fu x ray today  -Patient advised to return or notify health care team  if  new concerns arise.  Patient Instructions  Acts like achilles infalammation and heel spur  Bursitis area.   X ray today  Relative rest and  Ice after activity  Antiinflammatory topical ok  Or oral ibuprofen . May take weeks to  Get better .  Fu with PCP or sport medicine in 2 -3 weeks    Rosen's Emergency Medicine: Concepts and Clinical Practice (9th ed., pp. 2595-6387). [place], [place]: [place]. Retrieved from https://www.clinicalkey.com/#!/content/book/3-[email or web address]?[email or web address]">  Achilles Tendinitis  Achilles tendinitis is inflammation of the tough, cord-like band that attaches the lower leg muscles to the heel bone (Achilles tendon). This is usually caused by overusing the tendon and the ankle joint. Achilles tendinitis usually gets better over time with treatment and caring for yourself at home. It can take weeks or months to heal completely. What are the causes? This condition may be caused by:  A sudden increase in exercise or activity, such as  running.  Doing the same exercises or activities, such as jumping, over and over.  Not warming up calf muscles before exercising.  Exercising in shoes that are worn out or not made for exercise.  Having arthritis or a bone growth (spur) on the back of the heel bone. This can rub against the tendon and hurt it.  Age-related wear and tear. Tendons become less flexible with age and are more likely to be injured. What are the signs or symptoms? Common symptoms of this condition include:  Pain in the Achilles tendon or in the back of the leg, just above the heel. The pain usually gets worse with exercise.  Stiffness or soreness in the back of the leg, especially in the morning.  Swelling of the skin over the Achilles tendon.  Thickening of the tendon.  Trouble standing on tiptoe. How is this diagnosed? This condition is diagnosed based on your symptoms and a physical exam. You may have tests, including:  X-rays.  MRI. How is this treated? The goal of treatment is to relieve symptoms and help your injury heal. Treatment may include:  Decreasing or stopping activities that caused the tendinitis. This may mean switching to low-impact exercises like biking or swimming.  Icing the injured area.  Doing physical therapy, including strengthening and stretching exercises.  Taking NSAIDs, such as ibuprofen, to help relieve pain and swelling.  Using supportive shoes, wraps, heel lifts, or a walking boot (air cast).  Having surgery. This may be done if your symptoms do not improve after other treatments.  Using high-energy shock wave impulses to stimulate the healing process (extracorporeal shock wave therapy). This is rare.  Having an injection of medicines that help relieve inflammation (corticosteroids). This is rare. Follow these  instructions at home: If you have an air cast:  Wear the air cast as told by your health care provider. Remove it only as told by your health care  provider.  Loosen it if your toes tingle, become numb, or turn cold and blue.  Keep it clean.  If the air cast is not waterproof: ? Do not let it get wet. ? Cover it with a watertight covering when you take a bath or shower. Managing pain, stiffness, and swelling   If directed, put ice on the injured area. To do this: ? If you have a removable air cast, remove it as told by your health care provider. ? Put ice in a plastic bag. ? Place a towel between your skin and the bag. ? Leave the ice on for 20 minutes, 2-3 times a day.  Move your toes often to reduce stiffness and swelling.  Raise (elevate) your foot above the level of your heart while you are sitting or lying down. Activity  Gradually return to your normal activities as told by your health care provider. Ask your health care provider what activities are safe for you.  Do not do activities that cause pain.  Consider doing low-impact exercises, like cycling or swimming.  Ask your health care provider when it is safe to drive if you have an air cast on your foot.  If physical therapy was prescribed, do exercises as told by your health care provider or physical therapist. General instructions  If directed, wrap your foot with an elastic bandage or other wrap. This can help to keep your tendon from moving too much while it heals. Your health care provider will show you how to wrap your foot correctly.  Wear supportive shoes or heel lifts only as told by your health care provider.  Take over-the-counter and prescription medicines only as told by your health care provider.  Keep all follow-up visits as told by your health care provider. This is important. Contact a health care provider if you:  Have symptoms that get worse.  Have pain that does not get better with medicine.  Develop new, unexplained symptoms.  Develop warmth and swelling in your foot.  Have a fever. Get help right away if you:  Have a sudden  popping sound or sensation in your Achilles tendon followed by severe pain.  Cannot move your toes or foot.  Cannot put any weight on your foot.  Your foot or toes become numb and look white or blue even after loosening your bandage or air cast. Summary  Achilles tendinitis is inflammation of the tough, cord-like band that attaches the lower leg muscles to the heel bone (Achilles tendon).  This condition is usually caused by overusing the tendon and the ankle joint. It can also be caused by arthritis or normal aging.  The most common symptoms of this condition include pain, swelling, or stiffness in the Achilles tendon or in the back of the leg.  This condition is usually treated by decreasing or stopping activities that caused the tendinitis, icing the injured area, taking NSAIDs, and doing physical therapy. This information is not intended to replace advice given to you by your health care provider. Make sure you discuss any questions you have with your health care provider. Document Revised: 08/26/2018 Document Reviewed: 08/26/2018 Elsevier Patient Education  2020 Wink Joby Richart M.D.

## 2020-02-21 ENCOUNTER — Other Ambulatory Visit: Payer: Self-pay | Admitting: Family Medicine

## 2020-02-25 ENCOUNTER — Other Ambulatory Visit: Payer: Self-pay

## 2020-02-25 ENCOUNTER — Encounter: Payer: Self-pay | Admitting: Family Medicine

## 2020-02-25 ENCOUNTER — Ambulatory Visit (INDEPENDENT_AMBULATORY_CARE_PROVIDER_SITE_OTHER): Payer: 59 | Admitting: Family Medicine

## 2020-02-25 VITALS — BP 100/60 | HR 86 | Temp 98.2°F | Ht 66.0 in | Wt 264.4 lb

## 2020-02-25 DIAGNOSIS — I10 Essential (primary) hypertension: Secondary | ICD-10-CM | POA: Diagnosis not present

## 2020-02-25 DIAGNOSIS — E785 Hyperlipidemia, unspecified: Secondary | ICD-10-CM

## 2020-02-25 DIAGNOSIS — Z23 Encounter for immunization: Secondary | ICD-10-CM

## 2020-02-25 DIAGNOSIS — Z0000 Encounter for general adult medical examination without abnormal findings: Secondary | ICD-10-CM | POA: Diagnosis not present

## 2020-02-25 DIAGNOSIS — J4550 Severe persistent asthma, uncomplicated: Secondary | ICD-10-CM

## 2020-02-25 DIAGNOSIS — G4733 Obstructive sleep apnea (adult) (pediatric): Secondary | ICD-10-CM

## 2020-02-25 MED ORDER — LOSARTAN POTASSIUM 25 MG PO TABS
25.0000 mg | ORAL_TABLET | Freq: Every day | ORAL | Status: DC
Start: 2020-02-25 — End: 2020-06-14

## 2020-02-25 NOTE — Progress Notes (Signed)
Beatrix Shipper DOB: May 10, 1965 Encounter date: 02/25/2020  This is a 54 y.o. female who presents for complete physical   History of present illness/Additional concerns: Weepiness is feeling better with wellbutrin.   Headaches are really bad in fall. They tend to be this way. Used to get them 10-12 times/month. Now getting closer to 8;even as good as 3x/month. nurtec works well at onset of headache. Also can take flexeril or hydroxyzine which helps. She is taking gabapentin 300mg  daily. Does have specialist; through [facility]. She is on aimovig as well for control. Also has upcoming appointment with allergy.  She was seen on 10/26 for Achilles tendinitis: going to PT for this now. Little better with stretches and she is icing regularly.   Obstructive sleep apnea: Following with Dr.Sood: They are arranging for in lab CPAP titration study. She has this tomorrow night.   Restrictive lung disease/asthma: Followed by Dr.Mannam - She continues with symbicort regularly. Uses albuterol prior to exercise. Breathing harder when she does "animals" on the floor.   Last visit with me was in April/23/21 -at that time we discussed recurrent fevers.  She was sent to infectious disease.  She saw Dr. Drucilla Schmidt in follow-up and no concerning results after full evaluation and consideration with recent imaging and blood work. Hasn't been getting the fevers any longer.   Last mammogram was 04/23/2019 which was normal.  She has had a breast reduction since this time. This has freed her up to go to gym 5/7 days/week.   She does follow regularly with dermatology. She does follow regularly with gynecology. Last Pap smear: January/2019.  Wasn't aware of acid reflux but is taking the protonix 40mg  daily.  Colon cancer screening: has appointment with gastro in 2 weeks. This is on her mind with aunt just passing with colon cancer.  Did get some localized erythema, raised skin from COVID vaccination 3rd shot.   Past Medical  History:  Diagnosis Date  . Asthma   . Depression   . 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 FUO (fever of unknown origin) 10/21/2019  . GERD (gastroesophageal reflux disease)   . Hypertension   . Migraine    Past Surgical History:  Procedure Laterality Date  . BREAST REDUCTION SURGERY Bilateral 06/12/2019   Procedure: BREAST REDUCTION WITH LIPOSUCTION;  Surgeon: Wallace Going, DO;  Location: [facility];  Service: Plastics;  Laterality: Bilateral;  4 hours, please  . CESAREAN SECTION  1994  . DILATION AND CURETTAGE OF UTERUS     multiple due to miscarriages  . KNEE ARTHROSCOPY Left 2013  . LAPAROSCOPIC APPENDECTOMY N/A 12/08/2018   Procedure: APPENDECTOMY LAPAROSCOPIC;  Surgeon: Donnie Mesa, MD;  Location: [facility];  Service: General;  Laterality: N/A;  . TENNIS ELBOW RELEASE/NIRSCHEL PROCEDURE  2009  . WISDOM TOOTH EXTRACTION     No Known Allergies Current Meds  Medication Sig  . albuterol (PROVENTIL) (2.5 MG/3ML) 0.083% nebulizer solution Take 2.5 mg by nebulization every 6 (six) hours as needed for wheezing or shortness of breath.  Marland Kitchen albuterol (VENTOLIN HFA) 108 (90 Base) MCG/ACT inhaler Inhale 2 puffs into the lungs every 6 (six) hours as needed for wheezing or shortness of breath.  . budesonide-formoterol (SYMBICORT) 160-4.5 MCG/ACT inhaler Inhale 2 puffs into the lungs 2 (two) times daily.  Marland Kitchen buPROPion (WELLBUTRIN XL) 150 MG 24 hr tablet Take 1 tablet (150 mg total) by mouth daily.  . calcium carbonate (OSCAL) 1500 (600 Ca)  MG TABS tablet Take 600 mg of elemental calcium by mouth 2 (two) times daily with a meal.  . Cyanocobalamin (VITAMIN B-12 PO) Take by mouth.  . cyclobenzaprine (FLEXERIL) 10 MG tablet Take 10 mg by mouth 3 (three) times daily as needed (migraines).  . DULoxetine (CYMBALTA) 60 MG capsule TAKE 1 CAPSULE BY MOUTH EVERY DAY  . FASENRA 30 MG/ML SOSY SECOND SHIP: INJECT ONE SYRINGE UNDER THE SKIN AT WEEK 4 AND 8, THEN  EVERY 8 WEEKS THEREAFTER. (Patient taking differently: Inject 30 mg into the skin See admin instructions. Every 8 weeks)  . fluticasone (FLOVENT HFA) 110 MCG/ACT inhaler Inhale 2 puffs into the lungs 2 (two) times daily. During respiratory infections/asthma flares.  . gabapentin (NEURONTIN) 300 MG capsule Take 300-900 mg by mouth See admin instructions. Take 1 tablet every AM, three tablets at bedtime  . montelukast (SINGULAIR) 10 MG tablet Take 1 tablet (10 mg total) by mouth daily.  . Multiple Vitamin (MULTIVITAMIN WITH MINERALS) TABS tablet Take 1 tablet by mouth daily.  . pantoprazole (PROTONIX) 40 MG tablet TAKE 1 TABLET BY MOUTH 2 TIMES DAILY BEFORE A MEAL.  Marland Kitchen Respiratory Therapy Supplies (FLUTTER) DEVI Use 3 times daily as directed  . Rimegepant Sulfate (NURTEC) 75 MG TBDP Take by mouth.  Marland Kitchen tiZANidine (ZANAFLEX) 4 MG tablet Take 4 mg by mouth every 6 (six) hours as needed for muscle spasms.  . traZODone (DESYREL) 100 MG tablet TAKE 1-2 TABLETS (100-200 MG TOTAL) BY MOUTH AT BEDTIME AS NEEDED FOR SLEEP.  Marland Kitchen valACYclovir (VALTREX) 1000 MG tablet Take 1 tablet (1,000 mg total) by mouth 2 (two) times daily. (Patient taking differently: Take 1,000 mg by mouth as needed. )  . [DISCONTINUED] losartan (COZAAR) 50 MG tablet TAKE 1 TABLET BY MOUTH EVERY DAY  . [DISCONTINUED] naproxen (NAPROSYN) 500 MG tablet Take 1 tablet (500 mg total) by mouth 2 (two) times daily with a meal.  . [DISCONTINUED] rizatriptan (MAXALT) 10 MG tablet TAKE 1 TABLET BY MOUTH ONCE AS NEEDED FOR MIGRAINE FOR UP TO 1 DOSE. MAY REPEAT IN 2 HOURS IF NEEDED   Social History   Tobacco Use  . Smoking status: Never Smoker  . Smokeless tobacco: Never Used  Substance Use Topics  . Alcohol use: Yes    Comment: occ   Family History  Problem Relation Age of Onset  . Depression Mother   . Lymphoma Mother 1  . Hypertension Father   . Heart attack Father 71  . Hypertension Brother   . High blood pressure Brother   . High  Cholesterol Brother   . Healthy Daughter   . Colon cancer Maternal Aunt 80  . Esophageal cancer Neg Hx   . Pancreatic cancer Neg Hx   . Stomach cancer Neg Hx      Review of Systems  Constitutional: Negative for activity change, appetite change, chills, fatigue, fever and unexpected weight change.  HENT: Negative for congestion, ear pain, hearing loss, sinus pressure, sinus pain, sore throat and trouble swallowing.   Eyes: Negative for pain and visual disturbance.  Respiratory: Negative for cough, chest tightness, shortness of breath and wheezing.   Cardiovascular: Negative for chest pain, palpitations and leg swelling.  Gastrointestinal: Negative for abdominal pain, blood in stool, constipation, diarrhea, nausea and vomiting.  Genitourinary: Negative for difficulty urinating and menstrual problem.  Musculoskeletal: Negative for arthralgias and back pain.  Skin: Negative for rash.  Neurological: Negative for dizziness, weakness, numbness and headaches.  Hematological: Negative for adenopathy. Does not  bruise/bleed easily.  Psychiatric/Behavioral: Negative for sleep disturbance and suicidal ideas. The patient is not nervous/anxious.     CBC:  Lab Results  Component Value Date   WBC 7.0 08/19/2019   HGB 11.1 (L) 08/19/2019   HGB 12.9 06/21/2018   HCT 34.5 (L) 08/19/2019   HCT 38.8 06/21/2018   MCH 28.6 12/13/2018   MCHC 32.2 08/19/2019   RDW 15.0 08/19/2019   RDW 12.8 06/21/2018   PLT 282.0 08/19/2019   CMP: Lab Results  Component Value Date   NA 143 08/19/2019   K 4.8 08/19/2019   CL 105 08/19/2019   CO2 32 08/19/2019   ANIONGAP 9 12/14/2018   GLUCOSE 97 08/19/2019   BUN 9 08/19/2019   CREATININE 0.79 08/19/2019   CREATININE 0.69 07/04/2018   GFRAA >60 12/14/2018   GFRAA 118 07/04/2018   CALCIUM 9.0 08/19/2019   PROT 6.8 10/21/2019   BILITOT 0.3 08/19/2019   ALKPHOS 59 08/19/2019   ALT 11 08/19/2019   AST 11 08/19/2019   LIPID: Lab Results  Component Value  Date   CHOL 218 (H) 08/19/2019   TRIG 126.0 08/19/2019   HDL 53.20 08/19/2019   LDLCALC 139 (H) 08/19/2019    Objective:  BP 100/60 (BP Location: Left Arm, Patient Position: Sitting, Cuff Size: Large)   Pulse 86   Temp 98.2 F (36.8 C) (Oral)   Ht 5\' 6"  (1.676 m)   Wt 264 lb 6.4 oz (119.9 kg)   SpO2 98%   BMI 42.68 kg/m   Weight: 264 lb 6.4 oz (119.9 kg)   BP Readings from Last 3 Encounters:  02/25/20 100/60  02/17/20 128/72  01/19/20 128/82   Wt Readings from Last 3 Encounters:  02/25/20 264 lb 6.4 oz (119.9 kg)  02/17/20 266 lb (120.7 kg)  01/19/20 272 lb 9.6 oz (123.7 kg)    Physical Exam Constitutional:      General: She is not in acute distress.    Appearance: She is well-developed and overweight.  HENT:     Head: Normocephalic and atraumatic.     Right Ear: External ear normal.     Left Ear: External ear normal.     Mouth/Throat:     Pharynx: No oropharyngeal exudate.  Eyes:     Conjunctiva/sclera: Conjunctivae normal.     Pupils: Pupils are equal, round, and reactive to light.  Neck:     Thyroid: No thyromegaly.  Cardiovascular:     Rate and Rhythm: Normal rate and regular rhythm.     Heart sounds: Normal heart sounds. No murmur heard.  No friction rub. No gallop.   Pulmonary:     Effort: Pulmonary effort is normal.     Breath sounds: Normal breath sounds.  Abdominal:     General: Bowel sounds are normal. There is no distension.     Palpations: Abdomen is soft. There is no mass.     Tenderness: There is no abdominal tenderness. There is no guarding.     Hernia: No hernia is present.  Musculoskeletal:        General: No tenderness or deformity. Normal range of motion.     Cervical back: Normal range of motion and neck supple.  Lymphadenopathy:     Cervical: No cervical adenopathy.  Skin:    General: Skin is warm and dry.     Findings: No rash.  Neurological:     Mental Status: She is alert and oriented to person, place, and time.     Deep  Tendon Reflexes: Reflexes normal.     Reflex Scores:      Tricep reflexes are 2+ on the right side and 2+ on the left side.      Bicep reflexes are 2+ on the right side and 2+ on the left side.      Brachioradialis reflexes are 2+ on the right side and 2+ on the left side.      Patellar reflexes are 2+ on the right side and 2+ on the left side. Psychiatric:        Speech: Speech normal.        Behavior: Behavior normal.        Thought Content: Thought content normal.     Assessment/Plan: Health Maintenance Due  Topic Date Due  . PAP SMEAR-Modifier  04/24/2020   Health Maintenance reviewed.  1. Preventative health care Keep up with exercise program. She is going to work on healthier eating as well. She has lost 20lbs since December which is fantastic. Shingles and Tdap vaccination today.  2. Primary hypertension On lower end today; we are going to decrease losartan to 25mg  daily (she will start with just half of her 50mg  tablet) and we will plan to have her update me with pressures in next 2-4 weeks. - CBC with Differential/Platelet; Future - Comprehensive metabolic panel; Future  3. Severe persistent asthma, unspecified whether complicated Breathing has been stable. Follows with pulm. Continue symbicort, albuterol.  4. OSA (obstructive sleep apnea) Has sleep eval this week. Discussed pros of treatment and patience with fitting/equipment adjustment.  5. Hyperlipidemia, unspecified hyperlipidemia type Will recheck today. She has been exercising, but has room for improvement with diet.  - Lipid panel; Future - TSH; Future  6. Need for Tdap vaccination - Tdap vaccine greater than or equal to 7yo IM  Return in about 6 months (around 08/24/2020).  Micheline Rough, MD

## 2020-02-26 ENCOUNTER — Ambulatory Visit (HOSPITAL_BASED_OUTPATIENT_CLINIC_OR_DEPARTMENT_OTHER): Payer: 59

## 2020-02-26 ENCOUNTER — Encounter (HOSPITAL_BASED_OUTPATIENT_CLINIC_OR_DEPARTMENT_OTHER): Payer: Self-pay

## 2020-02-26 ENCOUNTER — Other Ambulatory Visit: Payer: Self-pay

## 2020-02-26 DIAGNOSIS — G709 Myoneural disorder, unspecified: Secondary | ICD-10-CM | POA: Insufficient documentation

## 2020-02-26 DIAGNOSIS — J986 Disorders of diaphragm: Secondary | ICD-10-CM | POA: Diagnosis not present

## 2020-02-26 DIAGNOSIS — E662 Morbid (severe) obesity with alveolar hypoventilation: Secondary | ICD-10-CM

## 2020-02-26 DIAGNOSIS — G4733 Obstructive sleep apnea (adult) (pediatric): Secondary | ICD-10-CM | POA: Insufficient documentation

## 2020-02-26 DIAGNOSIS — J984 Other disorders of lung: Secondary | ICD-10-CM | POA: Insufficient documentation

## 2020-02-26 LAB — COMPREHENSIVE METABOLIC PANEL
AG Ratio: 1.6 (calc) (ref 1.0–2.5)
ALT: 19 U/L (ref 6–29)
AST: 18 U/L (ref 10–35)
Albumin: 4.4 g/dL (ref 3.6–5.1)
Alkaline phosphatase (APISO): 66 U/L (ref 37–153)
BUN/Creatinine Ratio: 10 (calc) (ref 6–22)
BUN: 11 mg/dL (ref 7–25)
CO2: 29 mmol/L (ref 20–32)
Calcium: 9.6 mg/dL (ref 8.6–10.4)
Chloride: 100 mmol/L (ref 98–110)
Creat: 1.09 mg/dL — ABNORMAL HIGH (ref 0.50–1.05)
Globulin: 2.7 g/dL (calc) (ref 1.9–3.7)
Glucose, Bld: 87 mg/dL (ref 65–99)
Potassium: 4.1 mmol/L (ref 3.5–5.3)
Sodium: 138 mmol/L (ref 135–146)
Total Bilirubin: 0.4 mg/dL (ref 0.2–1.2)
Total Protein: 7.1 g/dL (ref 6.1–8.1)

## 2020-02-26 LAB — CBC WITH DIFFERENTIAL/PLATELET
Absolute Monocytes: 329 cells/uL (ref 200–950)
Basophils Absolute: 93 cells/uL (ref 0–200)
Basophils Relative: 1.5 %
Eosinophils Absolute: 37 cells/uL (ref 15–500)
Eosinophils Relative: 0.6 %
HCT: 41.6 % (ref 35.0–45.0)
Hemoglobin: 13.7 g/dL (ref 11.7–15.5)
Lymphs Abs: 1848 cells/uL (ref 850–3900)
MCH: 29.5 pg (ref 27.0–33.0)
MCHC: 32.9 g/dL (ref 32.0–36.0)
MCV: 89.7 fL (ref 80.0–100.0)
MPV: 9.4 fL (ref 7.5–12.5)
Monocytes Relative: 5.3 %
Neutro Abs: 3894 cells/uL (ref 1500–7800)
Neutrophils Relative %: 62.8 %
Platelets: 344 10*3/uL (ref 140–400)
RBC: 4.64 10*6/uL (ref 3.80–5.10)
RDW: 13 % (ref 11.0–15.0)
Total Lymphocyte: 29.8 %
WBC: 6.2 10*3/uL (ref 3.8–10.8)

## 2020-02-26 LAB — LIPID PANEL
Cholesterol: 256 mg/dL — ABNORMAL HIGH (ref <200)
HDL: 51 mg/dL (ref >=50)
LDL Cholesterol (Calc): 176 mg/dL (calc) — ABNORMAL HIGH
Non-HDL Cholesterol (Calc): 205 mg/dL (calc) — ABNORMAL HIGH (ref <130)
Total CHOL/HDL Ratio: 5 (calc) — ABNORMAL HIGH (ref <5.0)
Triglycerides: 150 mg/dL — ABNORMAL HIGH (ref <150)

## 2020-02-26 LAB — TSH: TSH: 1.98 mIU/L

## 2020-02-27 NOTE — Procedures (Unsigned)
    NAME: Alyssa Clay DATE OF BIRTH:  09-11-1965 MEDICAL RECORD NUMBER [id number]  LOCATION: [facility]  PHYSICIAN: Zadie Rhine  DATE OF STUDY: 02/26/2020  SLEEP STUDY TYPE: Out of Center Sleep Test                REFERRING PHYSICIAN: Chesley Mires, MD  INDICATION FOR STUDY: ***  EPWORTH SLEEPINESS SCORE:   HEIGHT:    WEIGHT:      There is no height or weight on file to calculate BMI.  NECK SIZE:   in.  MEDICATIONS: ***  IMPRESSION:  ***    RECOMMENDATION:  ***   Alyssa Clay, [place], American Board of Sleep Medicine  ELECTRONICALLY SIGNED ON:  02/27/2020, 6:14 AM [place] PH: [phone]   FX: [phone] [place]

## 2020-02-27 NOTE — Procedures (Unsigned)
Pt was unable to do the study because of a migraine headache. 02/26/2020

## 2020-02-28 ENCOUNTER — Encounter: Payer: Self-pay | Admitting: Family Medicine

## 2020-03-01 ENCOUNTER — Encounter: Payer: Self-pay | Admitting: Family Medicine

## 2020-03-03 ENCOUNTER — Encounter: Payer: Self-pay | Admitting: Family Medicine

## 2020-03-04 ENCOUNTER — Other Ambulatory Visit: Payer: Self-pay | Admitting: Family Medicine

## 2020-03-04 DIAGNOSIS — R7989 Other specified abnormal findings of blood chemistry: Secondary | ICD-10-CM

## 2020-03-04 DIAGNOSIS — E785 Hyperlipidemia, unspecified: Secondary | ICD-10-CM

## 2020-03-04 DIAGNOSIS — R7301 Impaired fasting glucose: Secondary | ICD-10-CM

## 2020-03-11 ENCOUNTER — Encounter: Payer: Self-pay | Admitting: Family Medicine

## 2020-03-11 ENCOUNTER — Encounter: Payer: Self-pay | Admitting: Gastroenterology

## 2020-03-11 ENCOUNTER — Ambulatory Visit (INDEPENDENT_AMBULATORY_CARE_PROVIDER_SITE_OTHER): Payer: 59 | Admitting: Gastroenterology

## 2020-03-11 VITALS — BP 102/62 | HR 84 | Ht 65.5 in | Wt 263.0 lb

## 2020-03-11 DIAGNOSIS — K5900 Constipation, unspecified: Secondary | ICD-10-CM

## 2020-03-11 DIAGNOSIS — Z1211 Encounter for screening for malignant neoplasm of colon: Secondary | ICD-10-CM | POA: Diagnosis not present

## 2020-03-11 MED ORDER — NA SULFATE-K SULFATE-MG SULF 17.5-3.13-1.6 GM/177ML PO SOLN: ORAL | 0 refills | Status: DC

## 2020-03-11 NOTE — Progress Notes (Signed)
Referring Provider: Caren Macadam, MD Primary Care Physician:  Caren Macadam, MD  Reason for Consultation:  Hematochezia   IMPRESSION:  Episode of BRB per rectum this summer: Suspected outlet bleeding. Will evaluate for other causes at the time of screening colonoscopy.   Chronic constipation: Successfully managed by diet and Miralax PRN. No medication changes recommended at this time.   No prior colon cancer screening. Maternal aunt with colon cancer in her 11s: Colonoscopy recommended.   PLAN: Continue to use Miralax as needed for constipation Colonoscopy  The nature of the procedure, as well as the risks, benefits, and alternatives were carefully and thoroughly reviewed with the patient. Ample time for discussion and questions allowed. The patient understood, was satisfied, and agreed to proceed.  Please see the "Patient Instructions" section for addition details about the plan.  HPI: Alyssa Clay is a 54 y.o. female seen at the request of Dr. Elease Hashimoto for hematochezia. The history is obtained through the patient and review of her electronic health record.  She has severe migraines, OSA, restrictive lung disease, and saw ID last year unexplained recurrent fevers.   Had one episode of bright red blood on the tissue paper after a hard firm bowel movement this summer. No abdominal pain, rectal pain, or mucous in the stool. No further bleeding since that time. Uses Miralax when needed for constipation for many years.   Maternal Aunt received died from colon cancer at age 57.  No other known family history of colon cancer or polyps. No family history of uterine/endometrial cancer, pancreatic cancer or gastric/stomach cancer.  Labs 02/25/20 showed a normal CBC and liver enzymes.    Past Medical History:  Diagnosis Date  . Asthma   . Depression   . 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 FUO (fever of unknown origin) 10/21/2019  . GERD  (gastroesophageal reflux disease)   . Hypertension   . Migraine     Past Surgical History:  Procedure Laterality Date  . BREAST REDUCTION SURGERY Bilateral 06/12/2019   Procedure: BREAST REDUCTION WITH LIPOSUCTION;  Surgeon: Wallace Going, DO;  Location: [facility];  Service: Plastics;  Laterality: Bilateral;  4 hours, please  . CESAREAN SECTION  1994  . DILATION AND CURETTAGE OF UTERUS     multiple due to miscarriages  . KNEE ARTHROSCOPY Left 2013  . LAPAROSCOPIC APPENDECTOMY N/A 12/08/2018   Procedure: APPENDECTOMY LAPAROSCOPIC;  Surgeon: Donnie Mesa, MD;  Location: [facility];  Service: General;  Laterality: N/A;  . TENNIS ELBOW RELEASE/NIRSCHEL PROCEDURE  2009  . WISDOM TOOTH EXTRACTION      Current Outpatient Medications  Medication Sig Dispense Refill  . albuterol (PROVENTIL) (2.5 MG/3ML) 0.083% nebulizer solution Take 2.5 mg by nebulization every 6 (six) hours as needed for wheezing or shortness of breath.    Marland Kitchen albuterol (VENTOLIN HFA) 108 (90 Base) MCG/ACT inhaler Inhale 2 puffs into the lungs every 6 (six) hours as needed for wheezing or shortness of breath. 18 g 2  . budesonide-formoterol (SYMBICORT) 160-4.5 MCG/ACT inhaler Inhale 2 puffs into the lungs 2 (two) times daily. 1 Inhaler 5  . buPROPion (WELLBUTRIN XL) 150 MG 24 hr tablet Take 1 tablet (150 mg total) by mouth daily. 90 tablet 1  . calcium carbonate (OSCAL) 1500 (600 Ca) MG TABS tablet Take 600 mg of elemental calcium by mouth 2 (two) times daily with a meal.    . cyclobenzaprine (FLEXERIL) 10 MG tablet  Take 10 mg by mouth 3 (three) times daily as needed (migraines).    . DULoxetine (CYMBALTA) 60 MG capsule TAKE 1 CAPSULE BY MOUTH EVERY DAY 90 capsule 1  . FASENRA 30 MG/ML SOSY SECOND SHIP: INJECT ONE SYRINGE UNDER THE SKIN AT WEEK 4 AND 8, THEN EVERY 8 WEEKS THEREAFTER. (Patient taking differently: Inject 30 mg into the skin See admin instructions. Every 8 weeks) 1 Syringe 8  . fluticasone (FLOVENT  HFA) 110 MCG/ACT inhaler Inhale 2 puffs into the lungs 2 (two) times daily. During respiratory infections/asthma flares. 1 Inhaler 5  . gabapentin (NEURONTIN) 300 MG capsule Take 300-900 mg by mouth See admin instructions. Take 1 tablet every AM, three tablets at bedtime    . losartan (COZAAR) 25 MG tablet Take 1 tablet (25 mg total) by mouth daily.    . montelukast (SINGULAIR) 10 MG tablet Take 1 tablet (10 mg total) by mouth daily. 30 tablet 5  . Multiple Vitamin (MULTIVITAMIN WITH MINERALS) TABS tablet Take 1 tablet by mouth daily.    . pantoprazole (PROTONIX) 40 MG tablet TAKE 1 TABLET BY MOUTH 2 TIMES DAILY BEFORE A MEAL. 60 tablet 3  . Respiratory Therapy Supplies (FLUTTER) DEVI Use 3 times daily as directed 1 each 0  . Rimegepant Sulfate (NURTEC) 75 MG TBDP Take by mouth.    Marland Kitchen tiZANidine (ZANAFLEX) 4 MG tablet Take 4 mg by mouth every 6 (six) hours as needed for muscle spasms.    . traZODone (DESYREL) 100 MG tablet TAKE 1-2 TABLETS (100-200 MG TOTAL) BY MOUTH AT BEDTIME AS NEEDED FOR SLEEP. 180 tablet 1  . valACYclovir (VALTREX) 1000 MG tablet Take 1 tablet (1,000 mg total) by mouth 2 (two) times daily. (Patient taking differently: Take 1,000 mg by mouth as needed. ) 18 tablet 3   No current facility-administered medications for this visit.    Allergies as of 03/11/2020  . (No Known Allergies)    Family History  Problem Relation Age of Onset  . Depression Mother   . Lymphoma Mother 43  . Hypertension Father   . Heart attack Father 49  . Hypertension Brother   . High blood pressure Brother   . High Cholesterol Brother   . Healthy Daughter   . Colon cancer Maternal Aunt 68  . Esophageal cancer Neg Hx   . Pancreatic cancer Neg Hx   . Stomach cancer Neg Hx     Social History   Socioeconomic History  . Marital status: Married    Spouse name: Not on file  . Number of children: Not on file  . Years of education: Not on file  . Highest education level: Not on file    Occupational History  . 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 Smoking status: Never Smoker  . Smokeless tobacco: Never Used  Vaping Use  . Vaping Use: Never used  Substance and Sexual Activity  . Alcohol use: Yes    Comment: occ  . Drug use: Never  . Sexual activity: Yes    Partners: Male  Other Topics Concern  . Not on file  Social History Narrative  . Not on file   Social Determinants of Health   Financial Resource Strain:   . Difficulty of Paying Living Expenses: Not on file  Food Insecurity:   . Worried About Charity fundraiser in the Last Year: Not on file  . Ran Out of Food in the Last Year: Not on file  Transportation Needs:   .  Lack of Transportation (Medical): Not on file  . Lack of Transportation (Non-Medical): Not on file  Physical Activity:   . Days of Exercise per Week: Not on file  . Minutes of Exercise per Session: Not on file  Stress:   . Feeling of Stress : Not on file  Social Connections:   . Frequency of Communication with Friends and Family: Not on file  . Frequency of Social Gatherings with Friends and Family: Not on file  . Attends Religious Services: Not on file  . Active Member of Clubs or Organizations: Not on file  . Attends Archivist Meetings: Not on file  . Marital Status: Not on file  Intimate Partner Violence:   . Fear of Current or Ex-Partner: Not on file  . Emotionally Abused: Not on file  . Physically Abused: Not on file  . Sexually Abused: Not on file    Review of Systems: 12 system ROS is negative except as noted above.   Physical Exam: General:   Alert,  well-nourished, pleasant and cooperative in NAD Head:  Normocephalic and atraumatic. Eyes:  Sclera clear, no icterus.   Conjunctiva pink. Abdomen:  Soft, nontender, nondistended, normal bowel sounds, no rebound or guarding. No hepatosplenomegaly.   Rectal:  Deferred  Msk:  Symmetrical. No boney deformities LAD: No inguinal or umbilical LAD Extremities:  No clubbing  or edema. Neurologic:  Alert and  oriented x4;  grossly nonfocal Skin:  Intact without significant lesions or rashes. Psych:  Alert and cooperative. Normal mood and affect.    Angelo Caroll L. Tarri Glenn, MD, MPH 03/11/2020, 10:47 AM

## 2020-03-11 NOTE — Patient Instructions (Addendum)
It's time for a colonoscopy.   COLONOSCOPY: You have been scheduled for a colonoscopy. Please follow written instructions given to you at your visit today.   PREP: Please pick up your prep supplies at the pharmacy within the next 1-3 days.  INHALERS: If you use inhalers (even only as needed), please bring them with you on the day of your procedure.  Tips for colonoscopy:  - Stay well hydrated for 3-4 days prior to the exam. This reduces nausea and dehydration.  - To prevent skin/hemorrhoid irritation - prior to wiping, put A&Dointment or vaseline on the toilet paper. - Keep a towel or pad on the bed.  - Drink  64oz of clear liquids in the morning of prep day (prior to starting the prep) to be sure that there is enough fluid to flush the colon and stay hydrated!!!! This is in addition to the fluids required for preparation. - Use of a flavored hard candy, such as grape Anise Salvo, can counteract some of the flavor of the prep and may prevent some nausea.   OVER THE COUNTER MEDICATION(S):  . Continue to use Miralax as needed for constipation  If you are age 52 or younger, your body mass index should be between 19-25. Your Body mass index is 43.1 kg/m. If this is out of the aformentioned range listed, please consider follow up with your Primary Care Provider.   Thank you for trusting me with your gastrointestinal care!    Thornton Park, MD, MPH

## 2020-03-12 ENCOUNTER — Ambulatory Visit: Payer: 59 | Admitting: Surgical

## 2020-03-15 ENCOUNTER — Other Ambulatory Visit: Payer: Self-pay

## 2020-03-15 ENCOUNTER — Encounter: Payer: Self-pay | Admitting: Allergy

## 2020-03-15 ENCOUNTER — Ambulatory Visit (INDEPENDENT_AMBULATORY_CARE_PROVIDER_SITE_OTHER): Payer: 59 | Admitting: Allergy

## 2020-03-15 VITALS — BP 126/80 | HR 91 | Temp 98.2°F | Resp 18 | Ht 65.0 in | Wt 265.4 lb

## 2020-03-15 DIAGNOSIS — J310 Chronic rhinitis: Secondary | ICD-10-CM

## 2020-03-15 DIAGNOSIS — K219 Gastro-esophageal reflux disease without esophagitis: Secondary | ICD-10-CM

## 2020-03-15 DIAGNOSIS — J4550 Severe persistent asthma, uncomplicated: Secondary | ICD-10-CM

## 2020-03-15 DIAGNOSIS — Z8709 Personal history of other diseases of the respiratory system: Secondary | ICD-10-CM

## 2020-03-15 DIAGNOSIS — J9811 Atelectasis: Secondary | ICD-10-CM | POA: Diagnosis not present

## 2020-03-15 NOTE — Patient Instructions (Addendum)
Asthma/atelectasis  Daily controller medication(s):continue Symbicort 159mcg 2 puffs twice a day with spacer and rinse mouth afterwards. ? Continue fasenra injections at home every 8 weeks.  ? Do the flutter valve and incentive spirometer.  Prior to physical activity:May use albuterol rescue inhaler 2 puffs 5 to 15 minutes prior to strenuous physical activities.  Rescue medications:May use albuterol rescue inhaler 2 puffs or nebulizer every 4 to 6 hours as needed for shortness of breath, chest tightness, coughing, and wheezing. Monitor frequency of use.   During upper respiratory infections/asthma flares: Start asmanex 265mcg 1 puff twice a day with spacer and rinse mouth afterwards for 1 week. Asthma control goals:  Full participation in all desired activities (may need albuterol before activity) Albuterol use two times or less a week on average (not counting use with activity) Cough interfering with sleep two times or less a month Oral steroids no more than once a year No hospitalizations  Keep track of infections/bronchitis.  Chronic rhinitis  May use Flonase (fluticasone) nasal spray 1 spray per nostril twice a day as needed for nasal congestion.   May use azelastine nasal spray 1-2 sprays per nostril twice a day as needed for runny nose/drainage.  Nasal saline spray (i.e., Simply Saline) or nasal saline lavage (i.e., NeilMed) is recommended as needed and prior to medicated nasal sprays.  Gastroesophageal reflux disease  Continue pantoprazole (Protonix) as prescribed.  Continue reflux lifestyle modifications and diet.   Follow up in 5 months or sooner if needed.

## 2020-03-15 NOTE — Progress Notes (Signed)
Follow Up Note  RE: Alyssa Clay MRN: [id number] DOB: 01-Mar-1966 Date of Office Visit: 03/15/2020  Referring provider: Caren Macadam, MD Primary care provider: Caren Macadam, MD  Chief Complaint: Follow-up  History of Present Illness: I had the pleasure of seeing Alyssa Clay for a follow up visit at the [facility] on 03/15/2020. She is a 54 y.o. female, who is being followed for asthma, atelectasis, h/o frequent URIs, chronic rhinitis, GERD. Her previous allergy office visit was on 10/13/2019 with Dr. Maudie Clay. Today is a regular follow up visit.  Severe persistent asthma is reported as moderately controlled with Symbicort 160/4.5 mcg 2 puffs twice a day, Fasenra injections at home, albuterol as needed, and Asmanex 200 mcg for asthma flares.  She denies any coughing, wheezing, tightness in her chest, shortness of breath, and nocturnal awakenings.  Since her last office visit she has not required any systemic steroids or made any trips to the emergency room or urgent care due to breathing problems.  She has recently joined the gym and has been going 5 days a week.  She will use her albuterol prior to going to the gym each day.  She has had to use her Asmanex 200 mcg here and there on cardio days at the gym.  She will use her flutter valve and incentive spirometer when she remembers.  She is more apt to use it on the weekends.  She has recently not been using her oxygen at 2 L at night.  She has not been using a CPAP or BiPAP machine, but reports that she has a sleep study coming up when she has time.  Since her last office visit she has probably had one ear infection for which she received an antibiotic.  She reports that after her last office visit she did see infectious disease and was told that she was having fevers for unknown reasons.  A month after she last saw Dr. Maudie Clay she has no longer had off-and-on fevers.  Chronic rhinitis is reported as controlled with  no medications.  She reports that she has not had to use Flonase or Astelin nasal spray in the past year.  She denies any rhinorrhea, nasal congestion, and postnasal drip.  She reports itchy watery eyes.  She saw her eye doctor recently who gave her eyedrops.  She is not sure of the name of these eyedrops.  Reflux is reported as doing fine with Protonix.  Assessment and Plan: Alyssa Clay is a 54 y.o. female with: Severe persistent asthma Past history - Spiriva caused coughing. Normal alpha-1 level. Seen by pulmonary Interim history - stable. Joined a gym. Going 5 days a week  ACT score 18  Today's spirometry showed severe restriction.   Daily controller medication(s):continue Symbicort 160 2 puffs twice a day with spacer and rinse mouth afterwards. ? Continue fasenra injections - doing them at home.   Prior to physical activity:May use albuterol rescue inhaler 2 puffs 5 to 15 minutes prior to strenuous physical activities.  Rescue medications:May use albuterol rescue inhaler 2 puffs or nebulizer every 4 to 6 hours as needed for shortness of breath, chest tightness, coughing, and wheezing. Monitor frequency of use.   During upper respiratory infections/asthma flares: Start Asmanex 200 1 puff twice a day with spacer and rinse mouth afterwards for 1 week.  Repeat spirometry at next visit.   Atelectasis Past history - 9/9/202 CT chest showed: 1. Prominent elevation of the right hemidiaphragm with  associated right lung base atelectasis including complete right middle lobe atelectasis. Right hemidiaphragm paralysis not excluded. Interim history - saw pulmonology. Not using oxygen 2L at night.   Monitor symptoms and pulse ox.  Use oxygen as needed.  Encouraged using flutter valve and incentive spirometer.   History of frequent upper respiratory infection Past history - History of frequent bronchitis in her lifetime. normal basic immune evaluation. No antibiotics for URI's since last visit.  Noticed some mild fevers on and off with no triggers. Saw rheumatology. Interim history -Saw infectious disease-told fever of unknown cause- no fevers 1 month after last seeing Dr. Maudie Clay. Possibly had 1 ear infection since last appointment  Keep track of infections/bronchitis.  Chronic rhinitis Past history - All seasonal and perennial aeroallergen skin tests are negative despite a positive histamine control.  Tried Flonase, Xhance, Astelin, Xyzal, Periactin, and Claritin without adequate symptom relief.  Interm history - asymptomatic.   May use Flonase + Astelin 1 spray twice a day as needed.  Nasal saline spray (i.e., Simply Saline) or nasal saline lavage (i.e., NeilMed) is recommended as needed and prior to medicated nasal sprays.  Gastroesophageal reflux disease Controlled with Protonix.   Continue pantoprazole (Protonix) as prescribed.  Continue reflux lifestyle modifications and diet.   Return in about 5 months (around 08/13/2020).  Diagnostics: Spirometry:  Tracings reviewed. Her effort: Good reproducible efforts. FVC: 1.97 L FEV1: 1.45 L, 51% predicted FEV1/FVC ratio: 74% Interpretation: moderate restriction.  Please see scanned spirometry results for details.  Medication List:  Current Outpatient Medications  Medication Sig Dispense Refill  . albuterol (PROVENTIL) (2.5 MG/3ML) 0.083% nebulizer solution Take 2.5 mg by nebulization every 6 (six) hours as needed for wheezing or shortness of breath.    Marland Kitchen albuterol (VENTOLIN HFA) 108 (90 Base) MCG/ACT inhaler Inhale 2 puffs into the lungs every 6 (six) hours as needed for wheezing or shortness of breath. 18 g 2  . budesonide-formoterol (SYMBICORT) 160-4.5 MCG/ACT inhaler Inhale 2 puffs into the lungs 2 (two) times daily. 1 Inhaler 5  . buPROPion (WELLBUTRIN XL) 150 MG 24 hr tablet Take 1 tablet (150 mg total) by mouth daily. 90 tablet 1  . calcium carbonate (OSCAL) 1500 (600 Ca) MG TABS tablet Take 600 mg of elemental calcium  by mouth 2 (two) times daily with a meal.    . cyclobenzaprine (FLEXERIL) 10 MG tablet Take 10 mg by mouth 3 (three) times daily as needed (migraines).    . DULoxetine (CYMBALTA) 60 MG capsule TAKE 1 CAPSULE BY MOUTH EVERY DAY 90 capsule 1  . Erenumab-aooe (AIMOVIG) 140 MG/ML SOAJ INJECT 140 MG INTO THE SKIN EVERY 30 (THIRTY) DAYS.    Marland Kitchen FASENRA 30 MG/ML SOSY SECOND SHIP: INJECT ONE SYRINGE UNDER THE SKIN AT WEEK 4 AND 8, THEN EVERY 8 WEEKS THEREAFTER. (Patient taking differently: Inject 30 mg into the skin See admin instructions. Every 8 weeks) 1 Syringe 8  . fluticasone (FLOVENT HFA) 110 MCG/ACT inhaler Inhale 2 puffs into the lungs 2 (two) times daily. During respiratory infections/asthma flares. 1 Inhaler 5  . gabapentin (NEURONTIN) 300 MG capsule Take 300-900 mg by mouth See admin instructions. Take 1 tablet every AM, three tablets at bedtime    . hydrOXYzine (ATARAX/VISTARIL) 25 MG tablet Take 25 mg by mouth 3 (three) times daily as needed.    . montelukast (SINGULAIR) 10 MG tablet Take 1 tablet (10 mg total) by mouth daily. 30 tablet 5  . Multiple Vitamin (MULTIVITAMIN WITH MINERALS) TABS tablet Take 1  tablet by mouth daily.    . pantoprazole (PROTONIX) 40 MG tablet TAKE 1 TABLET BY MOUTH 2 TIMES DAILY BEFORE A MEAL. 60 tablet 3  . Rimegepant Sulfate (NURTEC) 75 MG TBDP Take by mouth.    Marland Kitchen tiZANidine (ZANAFLEX) 4 MG tablet Take 4 mg by mouth every 6 (six) hours as needed for muscle spasms.    . traZODone (DESYREL) 100 MG tablet TAKE 1-2 TABLETS (100-200 MG TOTAL) BY MOUTH AT BEDTIME AS NEEDED FOR SLEEP. 180 tablet 1  . valACYclovir (VALTREX) 1000 MG tablet Take 1 tablet (1,000 mg total) by mouth 2 (two) times daily. (Patient taking differently: Take 1,000 mg by mouth as needed. ) 18 tablet 3  . losartan (COZAAR) 25 MG tablet Take 1 tablet (25 mg total) by mouth daily. (Patient not taking: Reported on 03/15/2020)    . Na Sulfate-K Sulfate-Mg Sulf 17.5-3.13-1.6 GM/177ML SOLN Apply Coupon=BIN:  [id number] PCN: CN GROUP: [id number] ID: [id number] (Patient not taking: Reported on 03/15/2020) 354 mL 0  . Respiratory Therapy Supplies (FLUTTER) DEVI Use 3 times daily as directed (Patient not taking: Reported on 03/15/2020) 1 each 0   No current facility-administered medications for this visit.   Allergies: No Known Allergies I reviewed her past medical history, social history, family history, and environmental history and no significant changes have been reported from her previous visit.  Review of Systems  Constitutional: Negative for chills and fever.  HENT: Negative for congestion, postnasal drip and rhinorrhea.   Eyes: Positive for itching.  Respiratory: Negative for cough, chest tightness, shortness of breath and wheezing.   Cardiovascular: Negative for chest pain and palpitations.  Gastrointestinal: Negative for abdominal pain.  Genitourinary: Negative for dysuria.  Skin: Negative for rash.  Allergic/Immunologic: Negative for environmental allergies.  Neurological: Positive for headaches.       Reports history of migraines for which she takes hydroxyzine   Objective: BP 126/80 (BP Location: Left Arm, Patient Position: Sitting)   Pulse 91   Temp 98.2 F (36.8 C) (Temporal)   Resp 18   Ht 5\' 5"  (1.651 m)   Wt 265 lb 6.4 oz (120.4 kg)   SpO2 95%   BMI 44.16 kg/m  Body mass index is 44.16 kg/m. Physical Exam Vitals and nursing note reviewed.  Constitutional:      Appearance: Normal appearance. She is well-developed.  HENT:     Head: Normocephalic and atraumatic.     Comments: Pharynx normal. Eyes normal. Ears normal. Nose normal    Right Ear: Tympanic membrane, ear canal and external ear normal.     Left Ear: Tympanic membrane, ear canal and external ear normal.     Nose: Nose normal.     Mouth/Throat:     Mouth: Mucous membranes are moist.     Pharynx: Oropharynx is clear.  Eyes:     Conjunctiva/sclera: Conjunctivae normal.  Cardiovascular:     Rate and Rhythm:  Regular rhythm.     Heart sounds: Normal heart sounds. No murmur heard.  No friction rub. No gallop.   Pulmonary:     Effort: Pulmonary effort is normal.     Breath sounds: Normal breath sounds. No wheezing or rales.     Comments: Lungs clear to auscultation Musculoskeletal:     Cervical back: Neck supple.  Skin:    General: Skin is warm.     Findings: No rash.  Neurological:     Mental Status: She is alert and oriented to person, place, and time.  Psychiatric:  Mood and Affect: Mood normal.        Behavior: Behavior normal.    Previous notes and tests were reviewed. The plan was reviewed with the patient/family, and all questions/concerned were addressed.  It was my pleasure to see [place] today and participate in her care. Please feel free to contact me with any questions or concerns.  Sincerely,  Althea Charon, FNP [facility]  I performed a history and physical examination of the patient and discussed her management with the Nurse Practitioner. I reviewed the Nurse Practitioner's note and agree with the documented findings and plan of care. The note in its entirety was edited by myself, including the physical exam, assessment, and plan.   Rexene Alberts, DO [facility] [place] office: [place] office: [phone]

## 2020-03-15 NOTE — Assessment & Plan Note (Signed)
Past history - All seasonal and perennial aeroallergen skin tests are negative despite a positive histamine control.  Tried Flonase, Xhance, Astelin, Xyzal, Periactin, and Claritin without adequate symptom relief.  Interm history - asymptomatic.   May use Flonase + Astelin 1 spray twice a day as needed.  Nasal saline spray (i.e., Simply Saline) or nasal saline lavage (i.e., NeilMed) is recommended as needed and prior to medicated nasal sprays.

## 2020-03-15 NOTE — Assessment & Plan Note (Signed)
Past history - Spiriva caused coughing. Normal alpha-1 level. Seen by pulmonary Interim history - stable. Joined a gym. Going 5 days a week  ACT score 18  Today's spirometry showed severe restriction.   Daily controller medication(s):continue Symbicort 160 2 puffs twice a day with spacer and rinse mouth afterwards. ? Continue fasenra injections - doing them at home.   Prior to physical activity:May use albuterol rescue inhaler 2 puffs 5 to 15 minutes prior to strenuous physical activities.  Rescue medications:May use albuterol rescue inhaler 2 puffs or nebulizer every 4 to 6 hours as needed for shortness of breath, chest tightness, coughing, and wheezing. Monitor frequency of use.   During upper respiratory infections/asthma flares: Start Asmanex 200 1 puff twice a day with spacer and rinse mouth afterwards for 1 week.  Repeat spirometry at next visit.

## 2020-03-15 NOTE — Assessment & Plan Note (Signed)
Controlled with Protonix.   Continue pantoprazole (Protonix) as prescribed.  Continue reflux lifestyle modifications and diet.

## 2020-03-15 NOTE — Assessment & Plan Note (Signed)
Past history - 9/9/202 CT chest showed: 1. Prominent elevation of the right hemidiaphragm with associated right lung base atelectasis including complete right middle lobe atelectasis. Right hemidiaphragm paralysis not excluded. Interim history - saw pulmonology. Not using oxygen 2L at night.   Monitor symptoms and pulse ox.  Use oxygen as needed.  Encouraged using flutter valve and incentive spirometer.

## 2020-03-15 NOTE — Assessment & Plan Note (Signed)
Past history - History of frequent bronchitis in her lifetime. normal basic immune evaluation. No antibiotics for URI's since last visit. Noticed some mild fevers on and off with no triggers. Saw rheumatology. Interim history -Saw infectious disease-told fever of unknown cause- no fevers 1 month after last seeing Dr. Maudie Mercury. Possibly had 1 ear infection since last appointment  Keep track of infections/bronchitis.

## 2020-03-24 ENCOUNTER — Other Ambulatory Visit: Payer: Self-pay | Admitting: Family Medicine

## 2020-03-26 ENCOUNTER — Other Ambulatory Visit: Payer: Self-pay | Admitting: Family Medicine

## 2020-04-06 ENCOUNTER — Other Ambulatory Visit: Payer: Self-pay | Admitting: Allergy

## 2020-05-04 ENCOUNTER — Encounter: Payer: Self-pay | Admitting: Family Medicine

## 2020-05-04 ENCOUNTER — Ambulatory Visit (INDEPENDENT_AMBULATORY_CARE_PROVIDER_SITE_OTHER): Payer: 59 | Admitting: Family Medicine

## 2020-05-04 ENCOUNTER — Other Ambulatory Visit: Payer: Self-pay

## 2020-05-04 VITALS — BP 130/90 | HR 95 | Temp 98.4°F | Ht 65.5 in | Wt 260.2 lb

## 2020-05-04 DIAGNOSIS — M79671 Pain in right foot: Secondary | ICD-10-CM | POA: Diagnosis not present

## 2020-05-04 DIAGNOSIS — G8929 Other chronic pain: Secondary | ICD-10-CM | POA: Diagnosis not present

## 2020-05-04 DIAGNOSIS — M25511 Pain in right shoulder: Secondary | ICD-10-CM

## 2020-05-04 MED ORDER — MELOXICAM 15 MG PO TABS: 15.0000 mg | ORAL_TABLET | Freq: Every day | ORAL | 2 refills | Status: DC

## 2020-05-04 NOTE — Progress Notes (Signed)
   Subjective:    Patient ID: Alyssa Clay, female    DOB: Aug 07, 1965, 55 y.o.   MRN: [id number]  HPI Here for 6 months of pain in the bottom of the right foot and 2 months of pain in the right shoulder. She does not treat them with any medications. She works out at [employer] several days a week. She is seeing a physical therapist who is trying some dry needling, but this is not helping much.    Review of Systems  Constitutional: Negative.   Respiratory: Negative.   Cardiovascular: Negative.   Musculoskeletal: Positive for arthralgias.       Objective:   Physical Exam Constitutional:      Appearance: Normal appearance.  Cardiovascular:     Rate and Rhythm: Normal rate and regular rhythm.     Pulses: Normal pulses.     Heart sounds: Normal heart sounds.  Pulmonary:     Effort: Pulmonary effort is normal.     Breath sounds: Normal breath sounds.  Musculoskeletal:     Comments: She is tender in the anterior right shoulder. ROM is full, though extension and internal rotation are painful. No crepitus. She is also tender along the arch of the right foot   Neurological:     Mental Status: She is alert.           Assessment & Plan:  She has chronic right foot pain consistent with plantar fasciitis, and she has chronic right shoulder pain consistent with bursitis. She will start on Meloxicam 15 ng daily, and we wil refer her to Sports Medicine to address these issues.  Alyssa Penna, MD

## 2020-05-06 NOTE — Progress Notes (Signed)
Subjective:    I'm seeing this patient as a consultation for:  Dr. Sarajane Jews. Note will be routed back to referring provider/PCP.  CC: R shoulder and R foot  pain  I, Molly Weber, LAT, ATC, am serving as scribe for Dr. Lynne Leader.  HPI: Pt is a 55 y/o female presenting w/ c/o 6 month hx of R plantar foot pain and 2 month hx of R shoulder pain.  R foot: Plantar foot pain x few  months w/ no known MOI.  She was in a boot x 2.5 months which helped her symptoms.  She con't to wear the boot at the gym and when she has to walk for longer distances.  She locates her pain to her R post calcaneus and radiating into her R Achille's.  She has been going to PT at [facility] and would like to con't going there if recommended. -Swelling: No -Aggravating factors: walking w/o shoewear; aggressive stretching of her gastroc/Achille's -Treatments tried: PT; walking boot; laser therapy at PT -Diagnostic imaging: R foot and R ankle XR- 02/17/20  R shoulder pain: 2 month hx w/ no known MOI.  She has been going to PT at [facility] for 2 months and would like to con't going there if recommended. -Location: R post, lat and ant shoulder w/ radiating pain into her R upper arm -Mechanical symptoms: yes -Aggravating factors: R shoulder flexion w/ IR; resisted shoulder exercises above chest/shoulder height -Treatments tried: PT including dry needling; Meloxicam  Past medical history, Surgical history, Family history, Social history, Allergies, and medications have been entered into the medical record, reviewed.   Review of Systems: No new headache, visual changes, nausea, vomiting, diarrhea, constipation, dizziness, abdominal pain, skin rash, fevers, chills, night sweats, weight loss, swollen lymph nodes, body aches, joint swelling, muscle aches, chest pain, shortness of breath, mood changes, visual or auditory hallucinations.   Objective:    Vitals:   05/07/20 1012  BP: 124/78  Pulse: 95  SpO2: 96%    General: Well Developed, well nourished, and in no acute distress.  Neuro/Psych: Alert and oriented x3, extra-ocular muscles intact, able to move all 4 extremities, sensation grossly intact. Skin: Warm and dry, no rashes noted.  Respiratory: Not using accessory muscles, speaking in full sentences, trachea midline.  Cardiovascular: Pulses palpable, no extremity edema. Abdomen: Does not appear distended. MSK: Right shoulder normal-appearing Normal motion. Intact strength. Positive Hawkins and Neer's test.  Positive empty can test. Negative Yergason's and speeds test. Pulses cap refill and sensation are intact distally.  Right foot normal. Normal foot and ankle motion. Mildly tender palpation posterior calcaneus.  Minimally tender plantar calcaneus. Stable ligamentous exam.  Intact strength. Pulses capillary refill and sensation are intact distally.   Lab and Radiology Results  EXAM: RIGHT FOOT COMPLETE - 3+ VIEW  COMPARISON:  None.  FINDINGS: The osseous structures appear diffusely demineralized which may limit detection of small or nondisplaced fractures. No acute bony abnormality. Specifically, no fracture, subluxation, or dislocation within the limitations of this nonweightbearing exam. Bidirectional calcaneal spurring is noted. No significant thickening or irregularity of the Achilles tendon. No abnormal stranding within Kager's fat pad. Additional mild degenerative changes noted throughout the foot and ankle. Bipartite lateral hallux sesamoid with sclerotic appearance may reflect sequela of prior sesamoiditis.  IMPRESSION: 1. No acute bony abnormality. 2. Bidirectional calcaneal spurring. 3. Bipartite lateral hallux sesamoid with sclerotic appearance may reflect sequela of prior sesamoiditis. 4. Additional mild degenerative changes in the foot diffusely.  Electronically Signed   By: Lovena Le M.D.   On: 02/17/2020 16:00 I, Lynne Leader, personally  (independently) visualized and performed the interpretation of the images attached in this note.   X-ray images right shoulder obtained today personally and independently interpreted  Mild AC DJD.  No fractures malalignment or significant degeneration otherwise. Await formal radiology review  Diagnostic Limited MSK Ultrasound of: Right calcaneus Plantar fascia intact.  Thickened plantar fascia at calcaneal insertion site near plantar calcaneal spur.  Measures 0.6 cm maximum thickness. This is consistent with plantar fasciitis Posterior calcaneus shows intact Achilles tendon.  Hyperechoic change near distal tendon insertion onto calcaneus consistent with Haglund deformity and heel spur. 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  Diagnostic Limited MSK Ultrasound of: Right shoulder Biceps tendon intact normal. Subscapularis tendon is intact. Supraspinatus tendon somewhat hypoechoic changes present within tendon structure consistent with possible degeneration or tiny bursal surface tears.  No significant full-thickness rotator cuff tear visible. Subacromial bursitis present. Infraspinatus tendon is intact. AC joint degenerative with effusion Impression: Tatar cuff tendinitis, possible tiny supraspinatus tears.  Subacromial bursitis.  AC DJD.    Impression and Recommendations:    Assessment and Plan: 55 y.o. female with  . Right shoulder pain: Thought to be due to rotator cuff tendinopathy and shoulder bursitis.  Pain failing to improve with physical therapy for 2 months.  At this point patient is failing typical conservative management.  Discussed options.  Plan for MRI to further characterize cause of pain and for potential surgical or injection planning.  Recheck after MRI.  Right heel pain: Patient has Planter fasciitis and Achilles tendinitis.  She has had this for about 6 months.  However  she has been improving with conservative management including physical therapy.  Plan to advance activities including weightbearing eccentric exercises.  Recommend also night splint.  Normally I will be recommending nitroglycerin patch protocol specially for her Achilles tendinopathy.  However she has a headache history so we will try Voltaren gel first...   PDMP not reviewed this encounter. Orders Placed This Encounter  Procedures  . [place] LIMITED JOINT SPACE STRUCTURES UP RIGHT(NO LINKED CHARGES)    Order Specific Question:   Reason for Exam (SYMPTOM  OR DIAGNOSIS REQUIRED)    Answer:   R shoulder pain    Order Specific Question:   Preferred imaging location?    Answer:   [place]  . DG Shoulder Right    Standing Status:   Future    Number of Occurrences:   1    Standing Expiration Date:   05/07/2021    Order Specific Question:   Reason for Exam (SYMPTOM  OR DIAGNOSIS REQUIRED)    Answer:   eval shoulder pain    Order Specific Question:   Is patient pregnant?    Answer:   No    Order Specific Question:   Preferred imaging location?    Answer:   [facility]  . MR SHOULDER RIGHT WO CONTRAST    Standing Status:   Future    Standing Expiration Date:   05/07/2021    Order Specific Question:   What is the patient's sedation requirement?    Answer:   No Sedation    Order Specific Question:   Does the patient have a pacemaker or implanted devices?    Answer:   No    Order Specific Question:   Preferred imaging location?  Answer:   [facility] (table limit-550lbs)   No orders of the defined types were placed in this encounter.   Discussed warning signs or symptoms. Please see discharge instructions. Patient expresses understanding.   The above documentation has been reviewed and is accurate and complete Lynne Leader, M.D.

## 2020-05-07 ENCOUNTER — Ambulatory Visit: Payer: Self-pay

## 2020-05-07 ENCOUNTER — Other Ambulatory Visit: Payer: Self-pay

## 2020-05-07 ENCOUNTER — Encounter: Payer: Self-pay | Admitting: Family Medicine

## 2020-05-07 ENCOUNTER — Ambulatory Visit (INDEPENDENT_AMBULATORY_CARE_PROVIDER_SITE_OTHER): Payer: 59

## 2020-05-07 ENCOUNTER — Ambulatory Visit (INDEPENDENT_AMBULATORY_CARE_PROVIDER_SITE_OTHER): Payer: 59 | Admitting: Family Medicine

## 2020-05-07 ENCOUNTER — Ambulatory Visit: Payer: 59 | Admitting: Family Medicine

## 2020-05-07 VITALS — BP 124/78 | HR 95 | Ht 65.5 in | Wt 261.0 lb

## 2020-05-07 DIAGNOSIS — M79671 Pain in right foot: Secondary | ICD-10-CM

## 2020-05-07 DIAGNOSIS — M25511 Pain in right shoulder: Secondary | ICD-10-CM | POA: Diagnosis not present

## 2020-05-07 DIAGNOSIS — G8929 Other chronic pain: Secondary | ICD-10-CM | POA: Diagnosis not present

## 2020-05-07 IMAGING — DX DG SHOULDER 2+V*R*
3 series · 3 of 3 positions shown · non-contrast
Comparison: None.

CLINICAL DATA: Right shoulder pain for the past 2 months.

EXAM:
RIGHT SHOULDER - 2+ VIEW

[shoulder ap (1 of 2)]
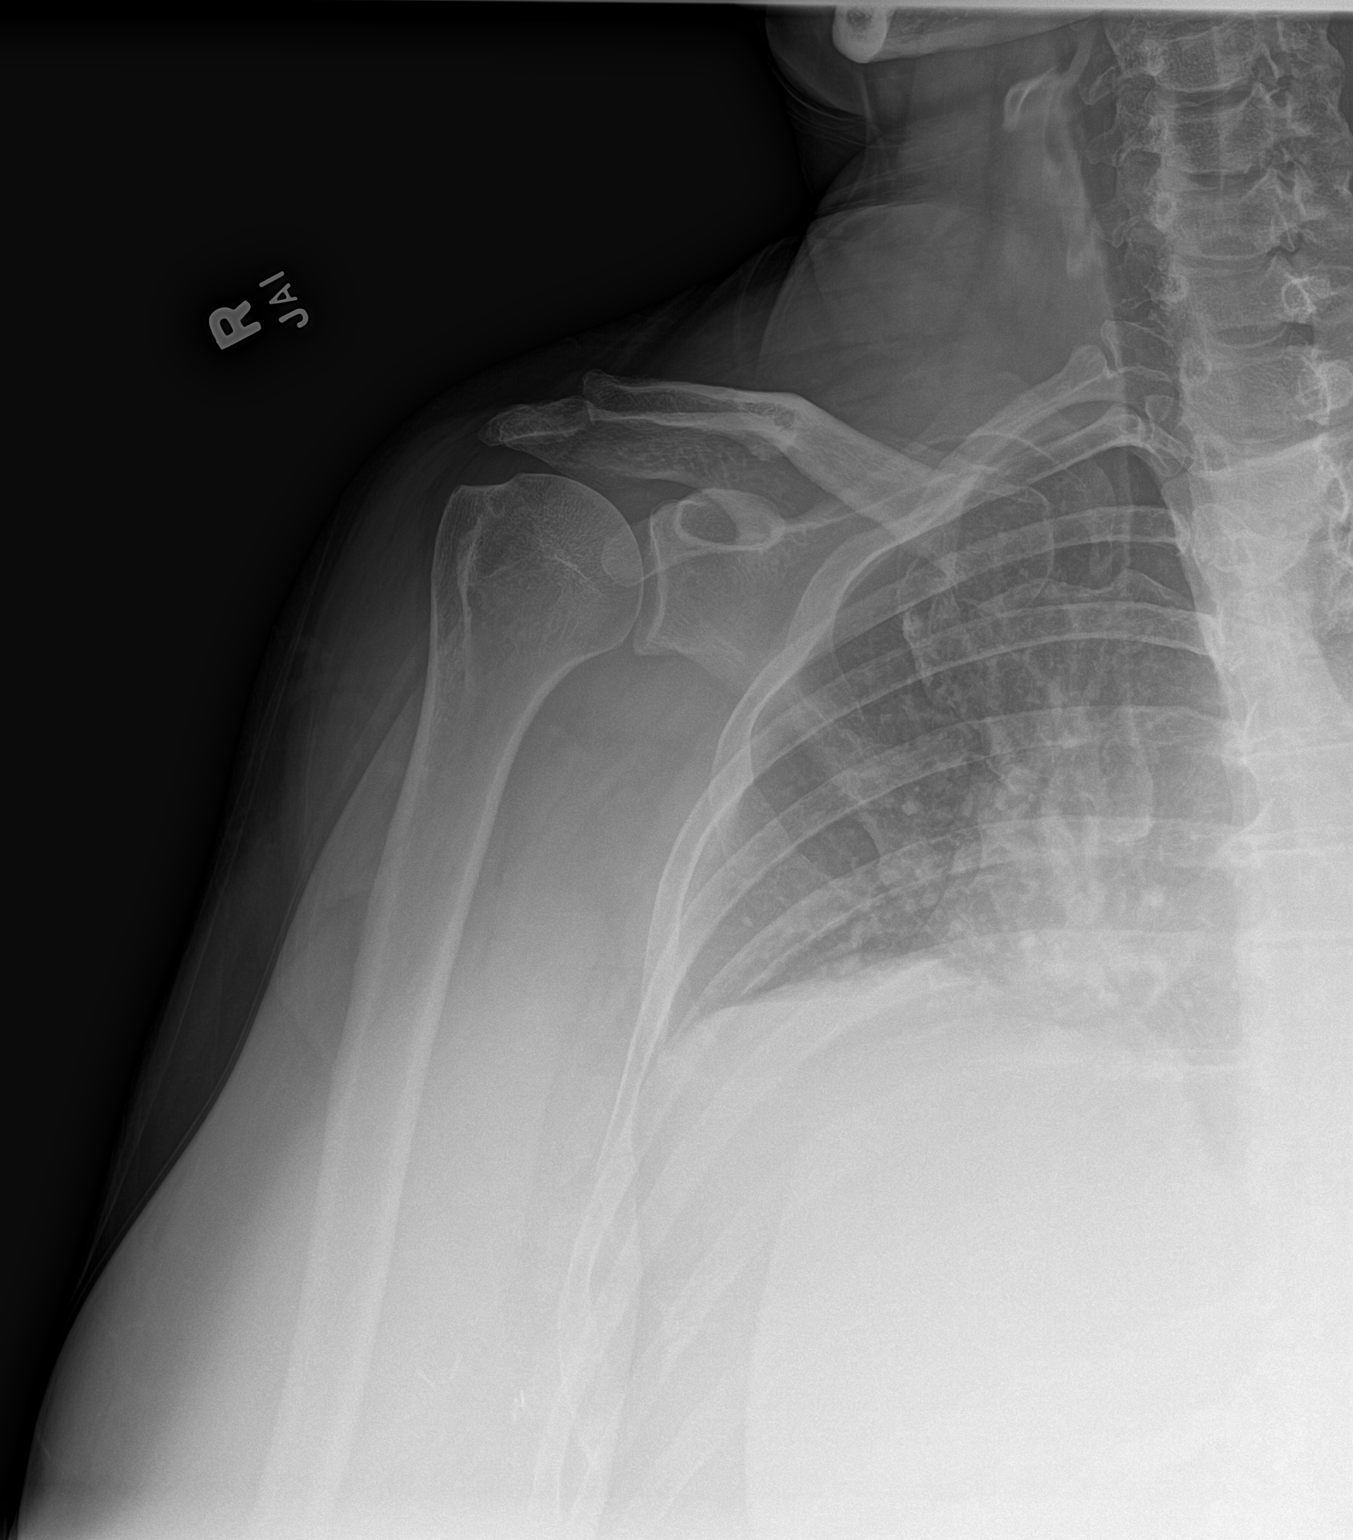

[shoulder ap (2 of 2)]
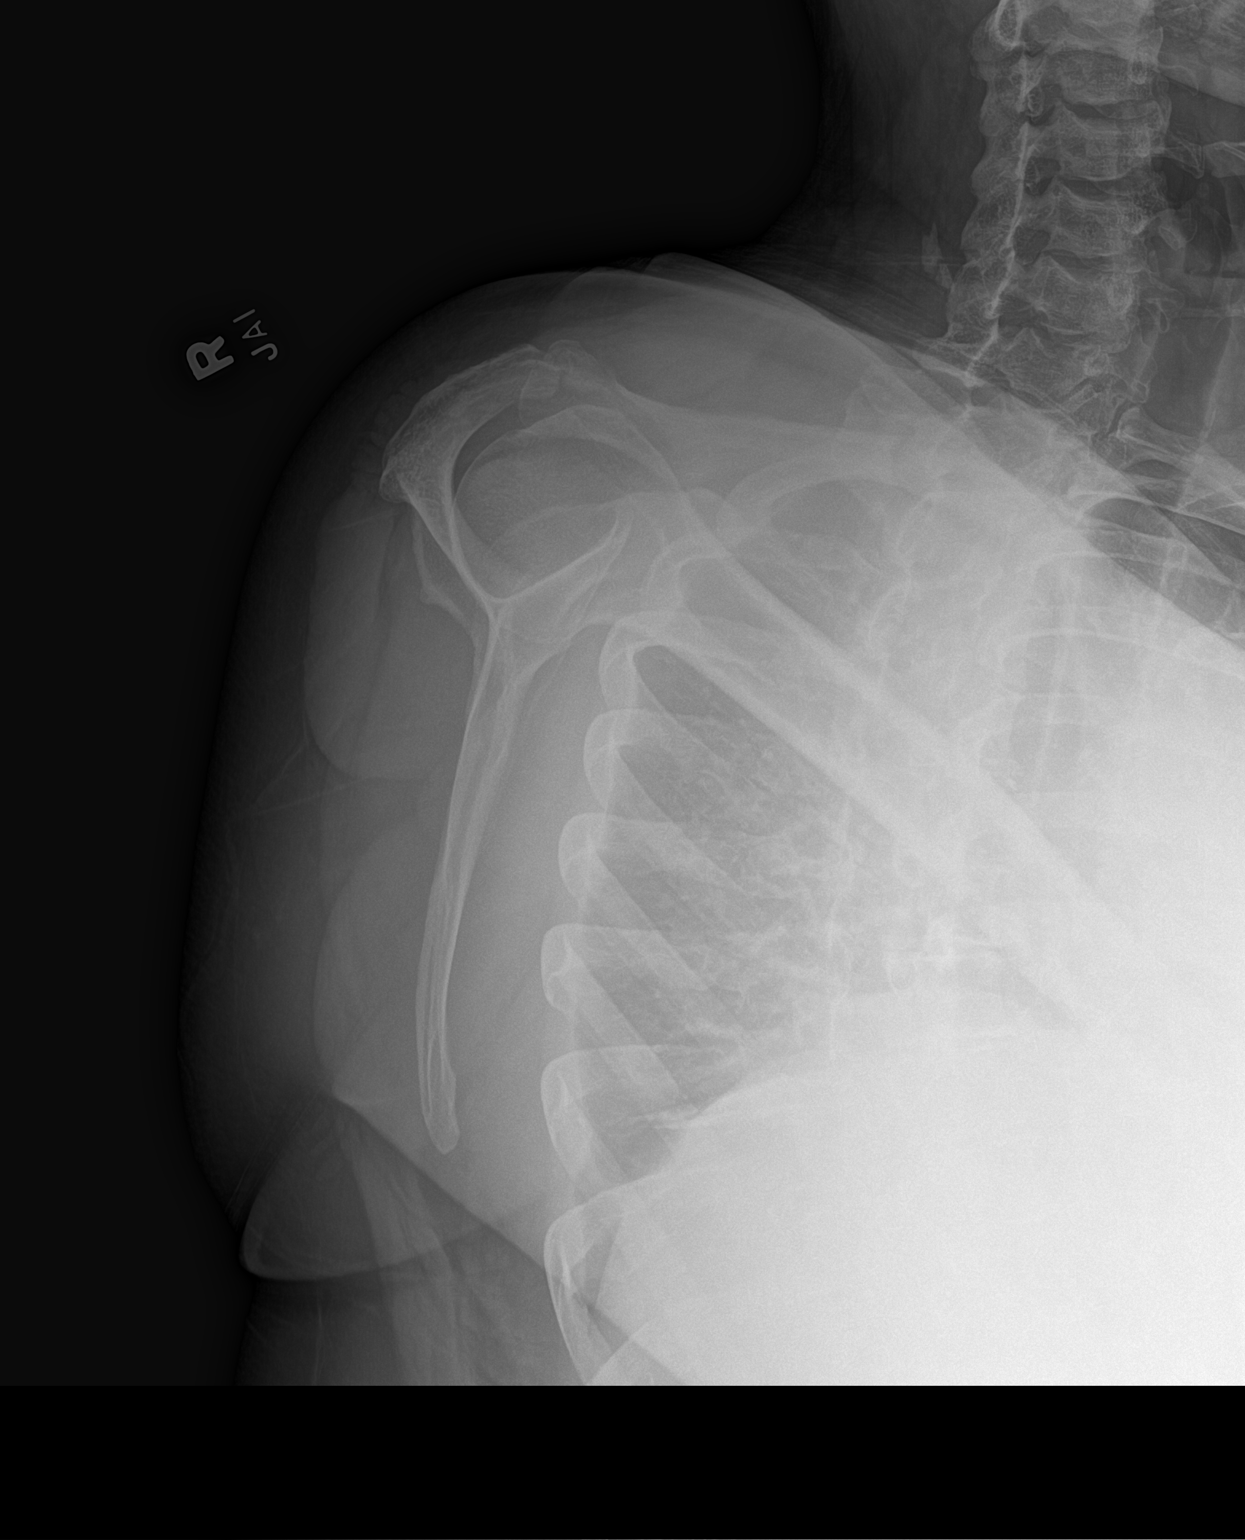

[shoulder axial]
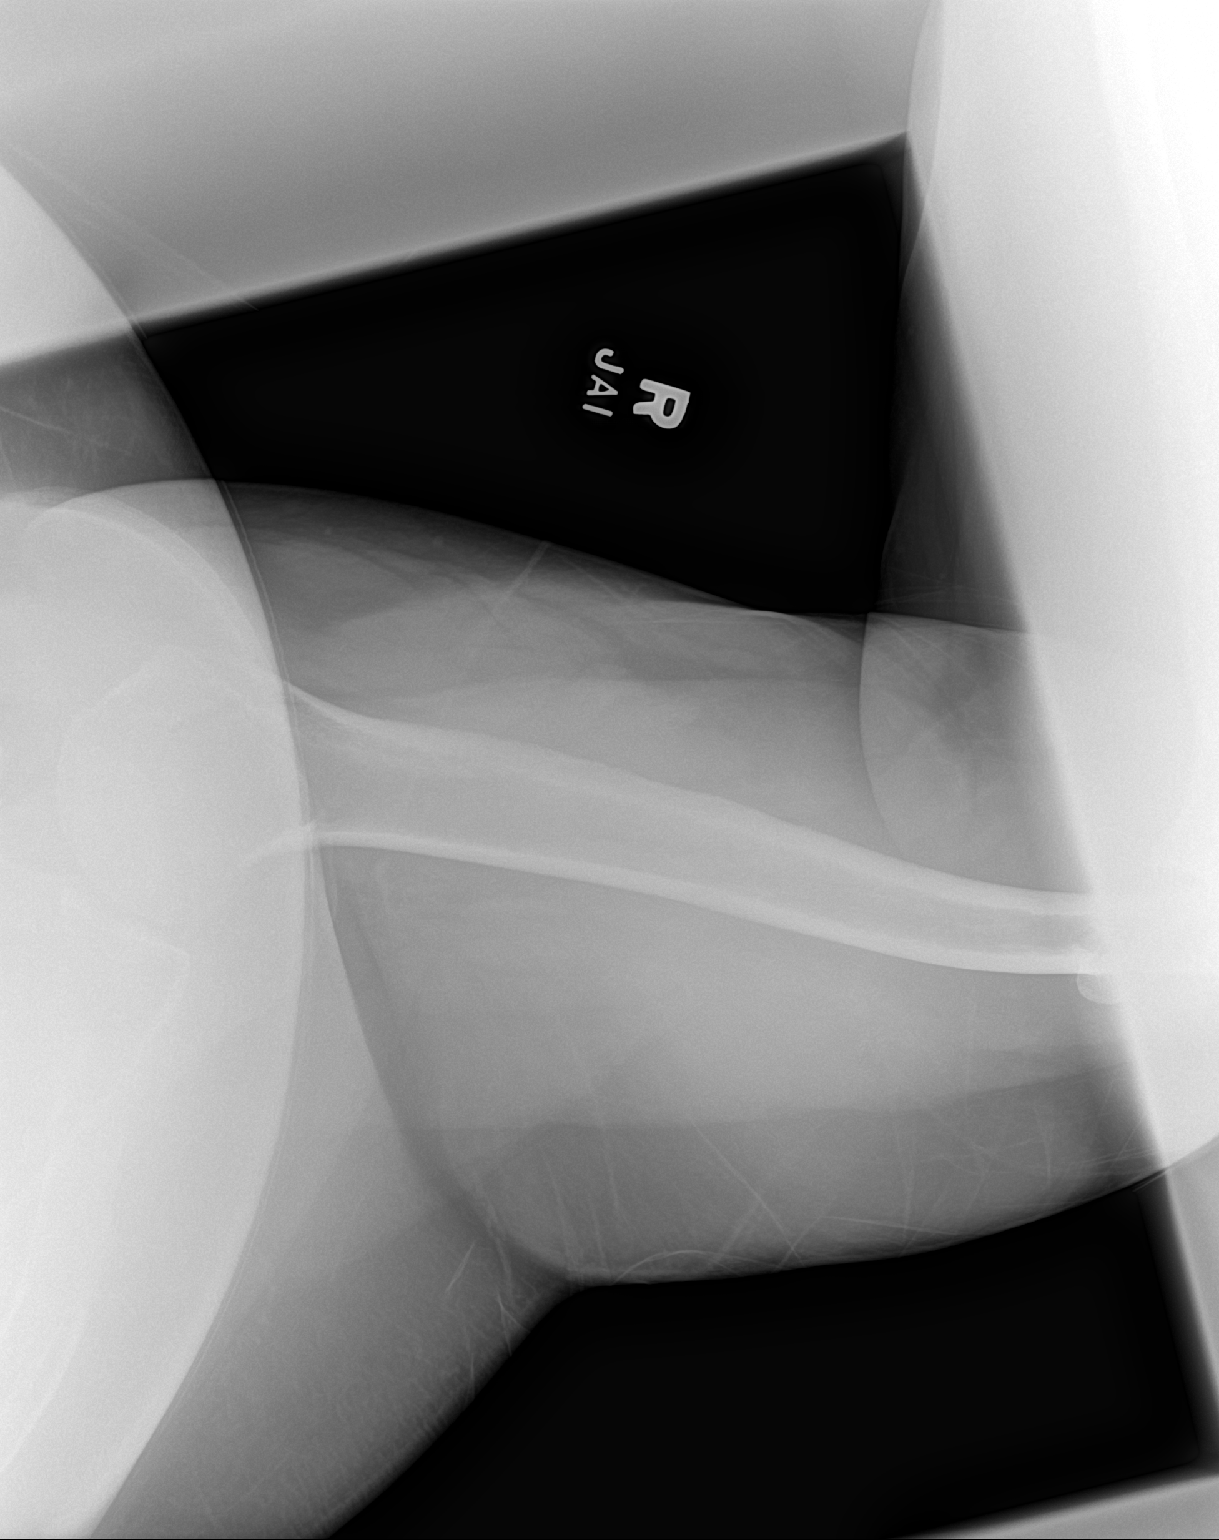

[3 of 3 positions shown; findings below may reference images not displayed]

FINDINGS: No acute fracture or dislocation. Minimal degenerative changes of
the acromioclavicular joint. The glenohumeral joint space is
preserved. Bone mineralization is normal. Soft tissues are
unremarkable.
IMPRESSION: 1. Minimal acromioclavicular degenerative changes.

## 2020-05-07 NOTE — Patient Instructions (Signed)
Thank you for coming in today.   Please get an Xray today before you leave  You should hear from MRI scheduling within 1 week. If you do not hear please let me know.   Please use voltaren gel up to 4x daily for pain as needed.   Do the heel up to down slowly exercises.  30 reps 2-3x daily.   Recheck following MRI.   Try a night splint.

## 2020-05-08 NOTE — Progress Notes (Signed)
Xray shoulder shows mild arthritis of the small joint at the top of the shoulder. Otherwise it looks normal to radiology.

## 2020-05-11 ENCOUNTER — Other Ambulatory Visit: Payer: Self-pay | Admitting: Family Medicine

## 2020-05-18 ENCOUNTER — Telehealth: Payer: Self-pay | Admitting: Gastroenterology

## 2020-05-18 NOTE — Telephone Encounter (Signed)
Hi Dr. Tarri Glenn,  Patient was scheduled for procedure on 05/21/20 she called requesting to reschedule due to having to attend a funeral. Her procedure is now for 06/09/20.  Thanks

## 2020-05-19 ENCOUNTER — Other Ambulatory Visit: Payer: Self-pay | Admitting: Allergy

## 2020-05-21 ENCOUNTER — Encounter: Payer: 59 | Admitting: Gastroenterology

## 2020-05-22 ENCOUNTER — Other Ambulatory Visit: Payer: Self-pay | Admitting: Family Medicine

## 2020-05-25 ENCOUNTER — Encounter: Payer: Self-pay | Admitting: Family Medicine

## 2020-05-25 ENCOUNTER — Other Ambulatory Visit: Payer: 59

## 2020-06-05 ENCOUNTER — Ambulatory Visit
Admission: RE | Admit: 2020-06-05 | Discharge: 2020-06-05 | Disposition: A | Discharge status: Home / Self Care | Payer: Self-pay | Source: Ambulatory Visit | Attending: Family Medicine | Admitting: Family Medicine

## 2020-06-05 ENCOUNTER — Other Ambulatory Visit: Payer: Self-pay

## 2020-06-05 DIAGNOSIS — M25511 Pain in right shoulder: Secondary | ICD-10-CM

## 2020-06-05 DIAGNOSIS — G8929 Other chronic pain: Secondary | ICD-10-CM

## 2020-06-05 IMAGING — MR MR SHOULDER*R* W/O CM
4 of 5 series · 24 of 40 positions shown · non-contrast
Comparison: None.

CLINICAL DATA: Right shoulder pain for 3 months.

EXAM:
MRI OF THE RIGHT SHOULDER WITHOUT CONTRAST
TECHNIQUE: Multiplanar, multisequence MR imaging of the shoulder was performed.
No intravenous contrast was administered.

[Series 11: T2 fat-sat · axial · right · 3.0mm · 0.59mm/px · z∈[-55,+46]mm · 8 of 27 slices shown (1 of 3)]
[im 1/27]
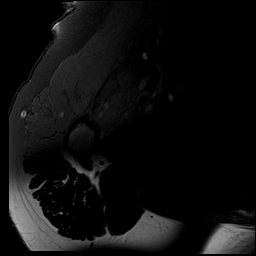
[im 3/27]
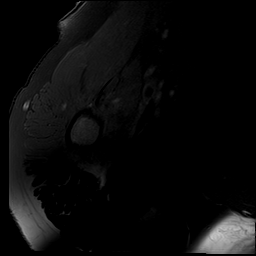
[im 9/27]
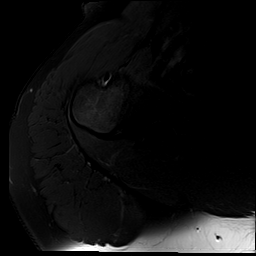
[im 12/27]
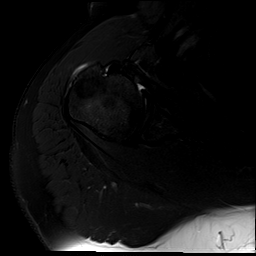
[im 15/27]
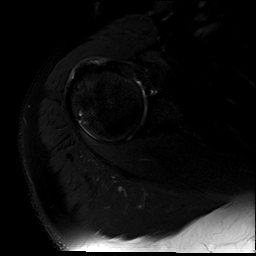
[im 18/27]
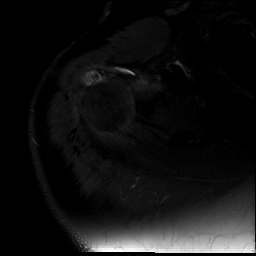
[im 24/27]
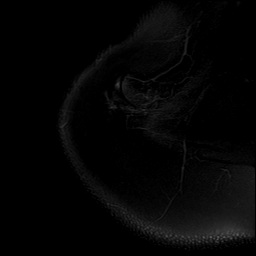
[im 27/27]
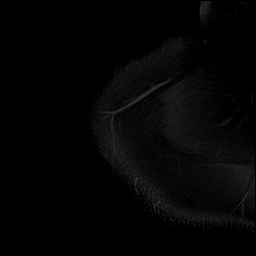

[Series 12: T2 fat-sat · sagittal · right · 4.0mm · 0.22mm/px · 6 of 21 slices shown (2 of 3)]
[im 1/21]
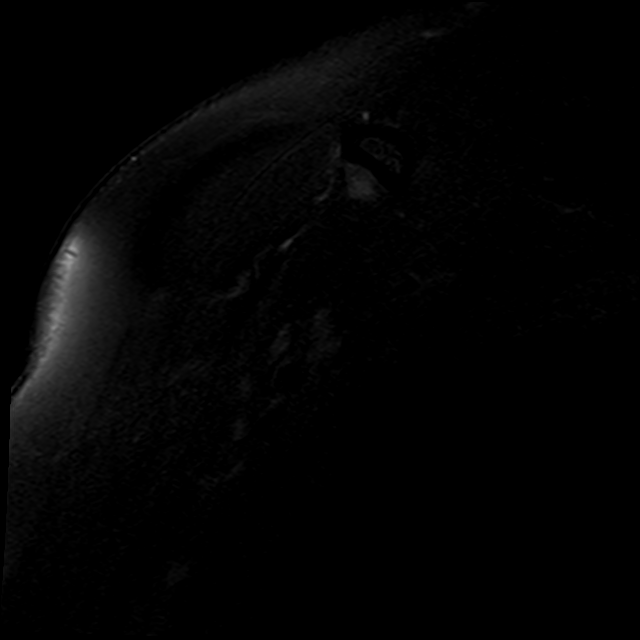
[im 4/21]
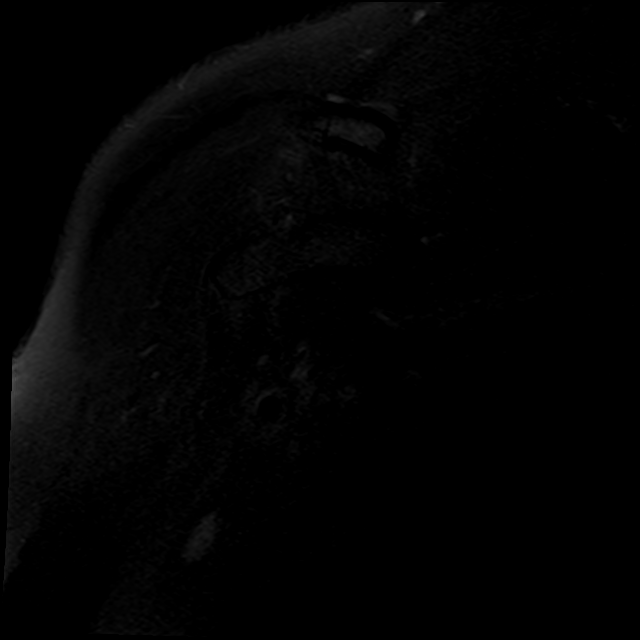
[im 7/21]
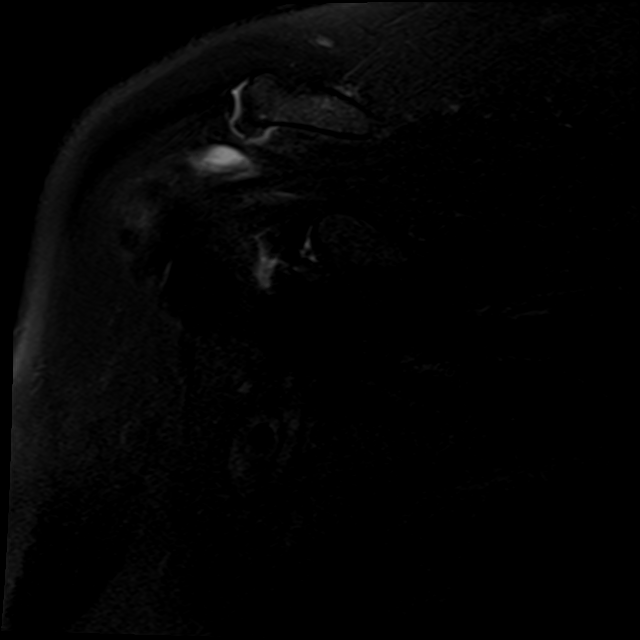
[im 11/21]
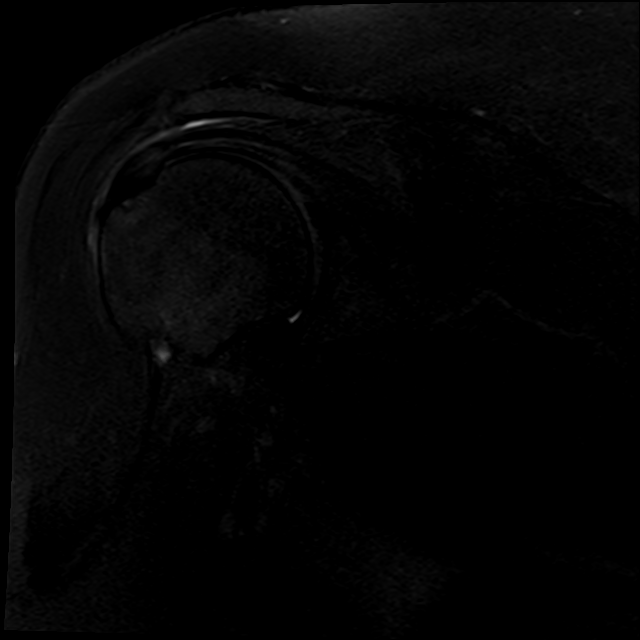
[im 14/21]
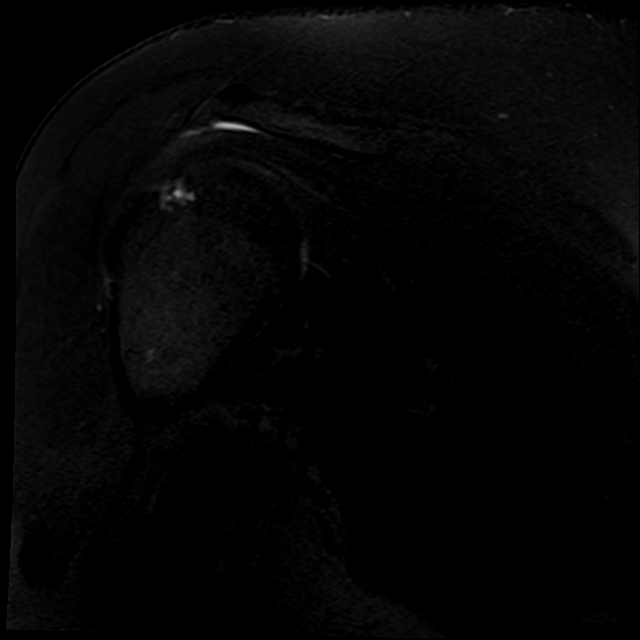
[im 17/21]
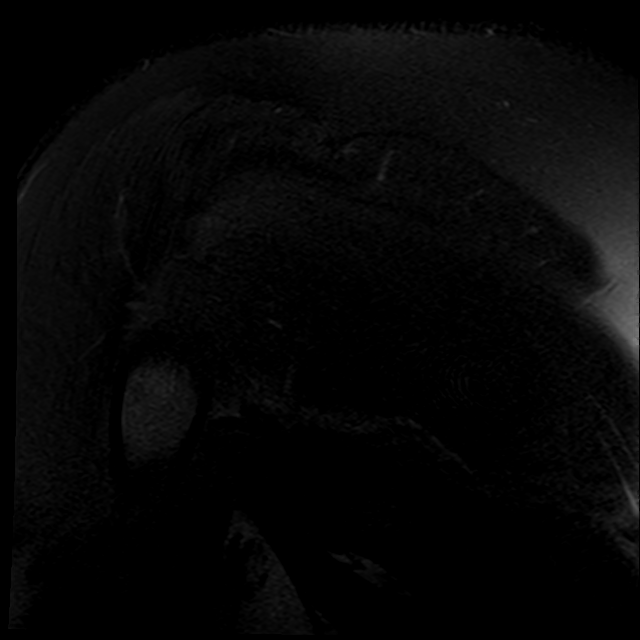

[Series 13: PD · sagittal · right · 4.0mm · 0.27mm/px · 7 of 21 slices shown]
[im 1/21]
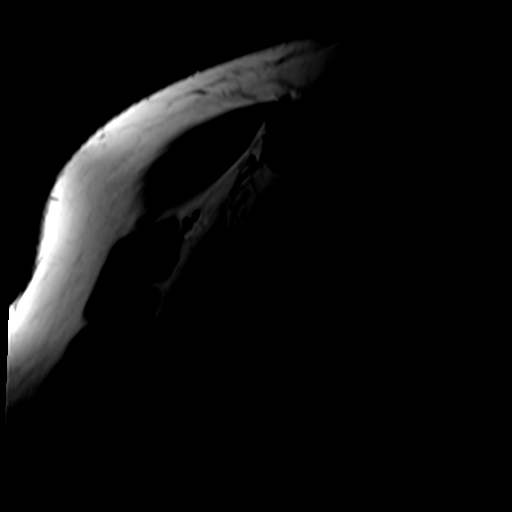
[im 4/21]
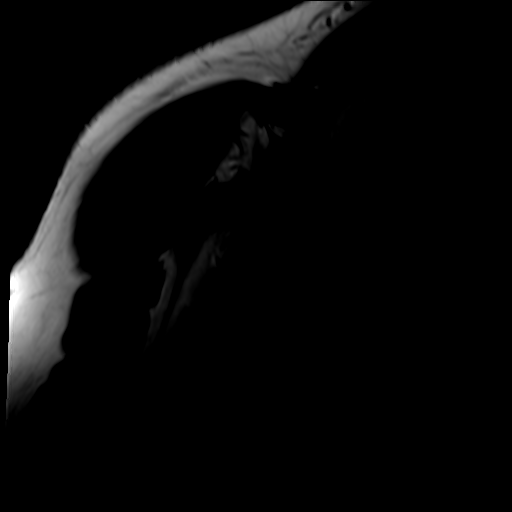
[im 7/21]
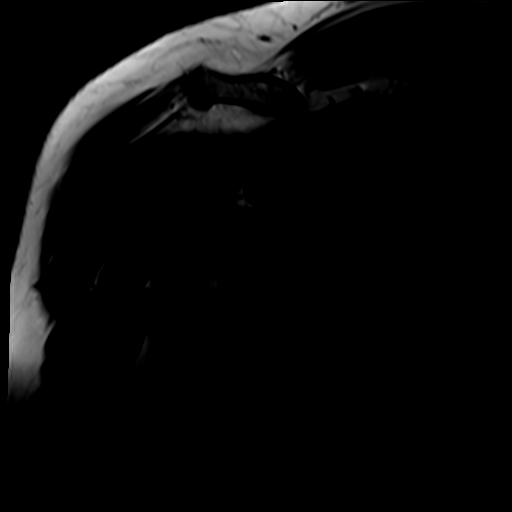
[im 11/21]
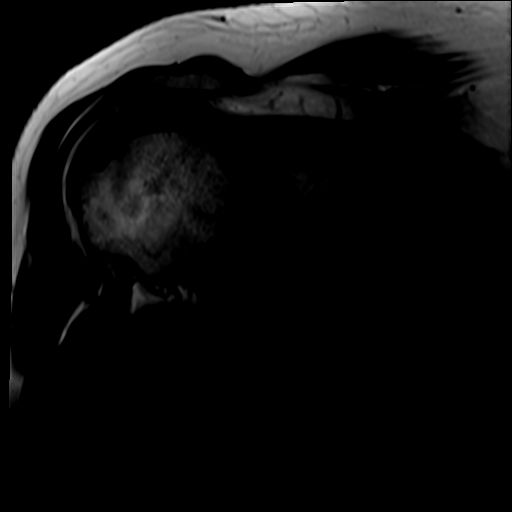
[im 14/21]
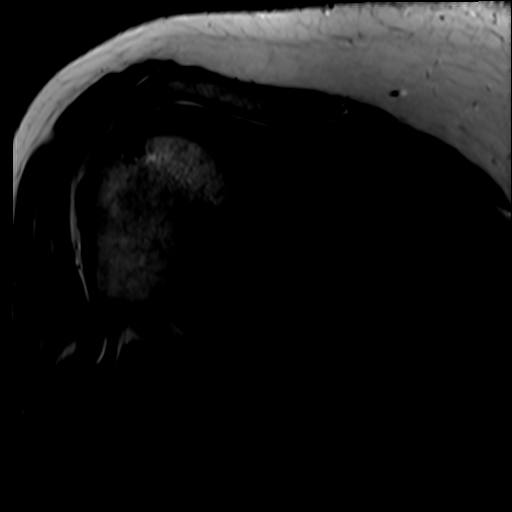
[im 17/21]
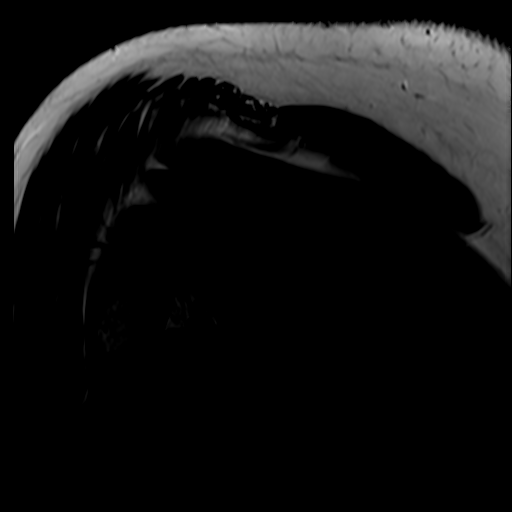
[im 21/21]
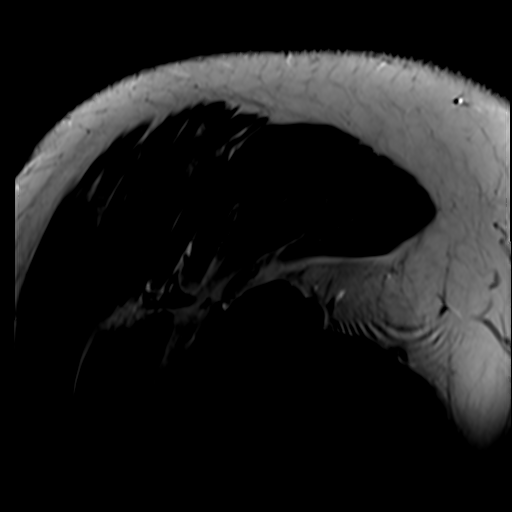

[Series 14: T2 fat-sat · coronal · right · 4.0mm · 0.44mm/px · 3 of 23 slices shown (3 of 3)]
[im 4/23]
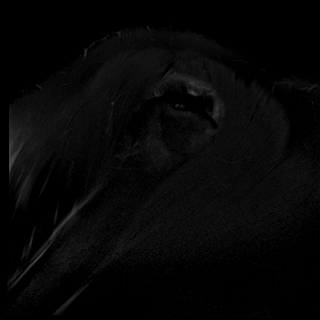
[im 13/23]
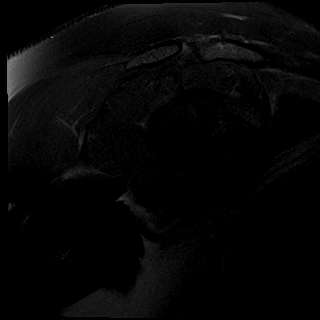
[im 19/23]
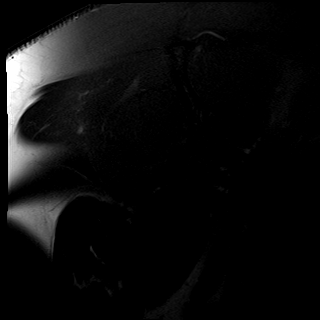

[24 of 40 positions shown; findings below may reference images not displayed]

FINDINGS: Rotator cuff: Moderate rotator cuff tendinopathy/tendinosis most
significantly involving the supraspinatus tendon with oblique
coursing interstitial tears. No full-thickness retracted tear is
identified.

Muscles:  Unremarkable.

Biceps long head:  Intact

Acromioclavicular Joint: No significant degenerative changes. Type 2
acromion. No lateral downsloping or subacromial spurring.

Glenohumeral Joint: Normal articular cartilage. No joint effusion.
Mild thickening of the capsular structures in the axillary recess
could suggest synovitis or adhesive capsulitis.

Labrum:  No definite labral tears.

Bones:  No acute bony findings.

Other: Mild/moderate subacromial/subdeltoid bursitis.
IMPRESSION: 1. Moderate rotator cuff tendinopathy/tendinosis most significantly
involving the supraspinatus tendon with oblique coursing
interstitial tears. No full-thickness retracted tear.
2. Intact long head biceps tendon and glenoid labrum.
3. No significant findings for bony impingement.
4. Mild/moderate subacromial/subdeltoid bursitis.

## 2020-06-07 NOTE — Progress Notes (Signed)
MRI right shoulder shows medium rotator cuff tendinitis with small rotator cuff tear.  Additionally there is shoulder bursitis.  Recommend that you return to clinic to review these findings in full detail and to discuss treatment plan and options.

## 2020-06-07 NOTE — Progress Notes (Unsigned)
I, Wendy Poet, LAT, ATC, am serving as scribe for Dr. Lynne Leader.  Alyssa Clay is a 55 y.o. female who presents to [place] at [facility] today for f/u R shoulder pain ongoing for 3 months and MRI review. Pt was last seen by Dr. Georgina Snell on 05/07/20 and was advised to recheck following MRI to further characterize her shoulder pain a potential surgical or injection planning. Today, pt reports that she's had a lot going on this month and has had to drive to [facility] twice.  She's not been to PT.  Her R shoulder remains about the same and con't to have pain w/ R shoulder AROM like before.  Dx imaging: 06/05/20 R shoulder MRI  05/07/20 R shoulder XR  Pertinent review of systems: No fevers or chills  Relevant historical information: Hypertension, migraines   Exam:  BP 130/82 (BP Location: Right Arm, Patient Position: Sitting, Cuff Size: Large)   Pulse 94   Ht 5' 5.5" (1.664 m)   Wt 259 lb (117.5 kg)   SpO2 96%   BMI 42.44 kg/m  General: Well Developed, well nourished, and in no acute distress.   MSK: Right shoulder normal-appearing nontender normal motion.  Pain with abduction.  Intact strength.  Positive Hawkins and Neer's test.    Lab and Radiology Results No results found for this or any previous visit (from the past 72 hour(s)). MR SHOULDER RIGHT WO CONTRAST  Result Date: 06/06/2020 CLINICAL DATA:  Right shoulder pain for 3 months. EXAM: MRI OF THE RIGHT SHOULDER WITHOUT CONTRAST TECHNIQUE: Multiplanar, multisequence MR imaging of the shoulder was performed. No intravenous contrast was administered. COMPARISON:  None. FINDINGS: Rotator cuff: Moderate rotator cuff tendinopathy/tendinosis most significantly involving the supraspinatus tendon with oblique coursing interstitial tears. No full-thickness retracted tear is identified. Muscles:  Unremarkable. Biceps long head:  Intact Acromioclavicular Joint: No significant degenerative changes. Type 2 acromion. No lateral  downsloping or subacromial spurring. Glenohumeral Joint: Normal articular cartilage. No joint effusion. Mild thickening of the capsular structures in the axillary recess could suggest synovitis or adhesive capsulitis. Labrum:  No definite labral tears. Bones:  No acute bony findings. Other: Mild/moderate subacromial/subdeltoid bursitis. IMPRESSION: 1. Moderate rotator cuff tendinopathy/tendinosis most significantly involving the supraspinatus tendon with oblique coursing interstitial tears. No full-thickness retracted tear. 2. Intact long head biceps tendon and glenoid labrum. 3. No significant findings for bony impingement. 4. Mild/moderate subacromial/subdeltoid bursitis. Electronically Signed   By: Marijo Sanes M.D.   On: 06/06/2020 13:47   I, Lynne Leader, personally (independently) visualized and performed the interpretation of the images attached in this note.  Procedure: Real-time Ultrasound Guided Injection of right shoulder subacromial bursa Device: Philips Affiniti 50G Images permanently stored and available for review in PACS Verbal informed consent obtained.  Discussed risks and benefits of procedure. Warned about infection bleeding damage to structures skin hypopigmentation and fat atrophy among others. Patient expresses understanding and agreement Time-out conducted.   Noted no overlying erythema, induration, or other signs of local infection.   Skin prepped in a sterile fashion.   Local anesthesia: Topical Ethyl chloride.   With sterile technique and under real time ultrasound guidance:  40 mg of Kenalog and 2 mL of Marcaine injected into right subacromial bursa. Fluid seen entering the bursa.   Completed without difficulty   Pain immediately resolved suggesting accurate placement of the medication.   Advised to call if fevers/chills, erythema, induration, drainage, or persistent bleeding.   Images permanently stored and available for  review in the ultrasound unit.  Impression:  Technically successful ultrasound guided injection.        Assessment and Plan: 55 y.o. female with right shoulder pain due to subacromial bursitis and rotator cuff tendinopathy with mild rotator cuff tear.  Plan for subacromial injection and continued home exercise program and PT.  Recheck in 6 weeks.   PDMP not reviewed this encounter. Orders Placed This Encounter  Procedures  . [place] LIMITED JOINT SPACE STRUCTURES UP RIGHT(NO LINKED CHARGES)    Order Specific Question:   Reason for Exam (SYMPTOM  OR DIAGNOSIS REQUIRED)    Answer:   R shoulder pain    Order Specific Question:   Preferred imaging location?    Answer:   [place]   No orders of the defined types were placed in this encounter.    Discussed warning signs or symptoms. Please see discharge instructions. Patient expresses understanding.   The above documentation has been reviewed and is accurate and complete Lynne Leader, M.D.

## 2020-06-08 ENCOUNTER — Ambulatory Visit (INDEPENDENT_AMBULATORY_CARE_PROVIDER_SITE_OTHER): Payer: 59 | Admitting: Family Medicine

## 2020-06-08 ENCOUNTER — Other Ambulatory Visit: Payer: Self-pay

## 2020-06-08 ENCOUNTER — Encounter: Payer: Self-pay | Admitting: Family Medicine

## 2020-06-08 ENCOUNTER — Ambulatory Visit: Payer: Self-pay

## 2020-06-08 VITALS — BP 130/82 | HR 94 | Ht 65.5 in | Wt 259.0 lb

## 2020-06-08 DIAGNOSIS — M25511 Pain in right shoulder: Secondary | ICD-10-CM | POA: Diagnosis not present

## 2020-06-08 NOTE — Patient Instructions (Addendum)
You had a R shoulder injection today.  Call or go to the ER if you develop a large red swollen joint with extreme pain or oozing puss.   Continue PT.   Ok to do limited lifting.   Recheck in 6 weeks.   Let me know if this is not working.

## 2020-06-09 ENCOUNTER — Encounter: Payer: 59 | Admitting: Gastroenterology

## 2020-06-14 ENCOUNTER — Other Ambulatory Visit: Payer: Self-pay

## 2020-06-14 ENCOUNTER — Ambulatory Visit (INDEPENDENT_AMBULATORY_CARE_PROVIDER_SITE_OTHER): Payer: 59 | Admitting: Family Medicine

## 2020-06-14 ENCOUNTER — Encounter: Payer: Self-pay | Admitting: Family Medicine

## 2020-06-14 VITALS — BP 120/80 | HR 97 | Temp 98.6°F | Wt 255.2 lb

## 2020-06-14 DIAGNOSIS — B372 Candidiasis of skin and nail: Secondary | ICD-10-CM

## 2020-06-14 DIAGNOSIS — L0291 Cutaneous abscess, unspecified: Secondary | ICD-10-CM

## 2020-06-14 DIAGNOSIS — T3695XA Adverse effect of unspecified systemic antibiotic, initial encounter: Secondary | ICD-10-CM | POA: Diagnosis not present

## 2020-06-14 DIAGNOSIS — B379 Candidiasis, unspecified: Secondary | ICD-10-CM | POA: Diagnosis not present

## 2020-06-14 MED ORDER — NYSTATIN 100000 UNIT/GM EX CREA: 1.0000 "application " | TOPICAL_CREAM | Freq: Two times a day (BID) | CUTANEOUS | 0 refills | Status: DC

## 2020-06-14 MED ORDER — SULFAMETHOXAZOLE-TRIMETHOPRIM 800-160 MG PO TABS
1.0000 | ORAL_TABLET | Freq: Two times a day (BID) | ORAL | 0 refills | Status: AC
Start: 2020-06-14 — End: 2020-06-21

## 2020-06-14 MED ORDER — FLUCONAZOLE 150 MG PO TABS
150.0000 mg | ORAL_TABLET | Freq: Once | ORAL | 0 refills | Status: AC
Start: 2020-06-14 — End: 2020-06-14

## 2020-06-14 NOTE — Patient Instructions (Signed)
Skin Abscess  A skin abscess is an infected area of your skin that contains pus and other material. An abscess can happen in any part of your body. Some abscesses break open (rupture) on their own. Most continue to get worse unless they are treated. The infection can spread deeper into the body and into your blood, which can make you feel sick. A skin abscess is caused by germs that enter the skin through a cut or scrape. It can also be caused by blocked oil and sweat glands or infected hair follicles. This condition is usually treated by:  Draining the pus.  Taking antibiotic medicines.  Placing a warm, wet washcloth over the abscess. Follow these instructions at home: Medicines  Take over-the-counter and prescription medicines only as told by your doctor.  If you were prescribed an antibiotic medicine, take it as told by your doctor. Do not stop taking the antibiotic even if you start to feel better.   Abscess care  If you have an abscess that has not drained, place a warm, clean, wet washcloth over the abscess several times a day. Do this as told by your doctor.  Follow instructions from your doctor about how to take care of your abscess. Make sure you: ? Cover the abscess with a bandage (dressing). ? Change your bandage or gauze as told by your doctor. ? Wash your hands with soap and water before you change the bandage or gauze. If you cannot use soap and water, use hand sanitizer.  Check your abscess every day for signs that the infection is getting worse. Check for: ? More redness, swelling, or pain. ? More fluid or blood. ? Warmth. ? More pus or a bad smell.   General instructions  To avoid spreading the infection: ? Do not share personal care items, towels, or hot tubs with others. ? Avoid making skin-to-skin contact with other people.  Keep all follow-up visits as told by your doctor. This is important. Contact a doctor if:  You have more redness, swelling, or pain  around your abscess.  You have more fluid or blood coming from your abscess.  Your abscess feels warm when you touch it.  You have more pus or a bad smell coming from your abscess.  You have a fever.  Your muscles ache.  You have chills.  You feel sick. Get help right away if:  You have very bad (severe) pain.  You see red streaks on your skin spreading away from the abscess. Summary  A skin abscess is an infected area of your skin that contains pus and other material.  The abscess is caused by germs that enter the skin through a cut or scrape. It can also be caused by blocked oil and sweat glands or infected hair follicles.  Follow your doctor's instructions on caring for your abscess, taking medicines, preventing infections, and keeping follow-up visits. This information is not intended to replace advice given to you by your health care provider. Make sure you discuss any questions you have with your health care provider. Document Revised: 11/14/2018 Document Reviewed: 05/24/2017 Elsevier Patient Education  2021 [place].  Skin Yeast Infection  A skin yeast infection is a condition in which there is an overgrowth of yeast (candida) that normally lives on the skin. This condition usually occurs in areas of the skin that are constantly warm and moist, such as the armpits or the groin. What are the causes? This condition is caused by a change in  the normal balance of the yeast and bacteria that live on the skin. What increases the risk? You are more likely to develop this condition if you:  Are obese.  Are pregnant.  Take birth control pills.  Have diabetes.  Take antibiotic medicines.  Take steroid medicines.  Are malnourished.  Have a weak body defense system (immune system).  Are 57 years of age or older.  Wear tight clothing. What are the signs or symptoms? The most common symptom of this condition is itchiness in the affected area. Other symptoms  include:  Red, swollen area of the skin.  Bumps on the skin. How is this diagnosed?  This condition is diagnosed with a medical history and physical exam.  Your health care provider may check for yeast by taking light scrapings of the skin to be viewed under a microscope. How is this treated? This condition is treated with medicine. Medicines may be prescribed or be available over the counter. The medicines may be:  Taken by mouth (orally).  Applied as a cream or powder to your skin. Follow these instructions at home:  Take or apply over-the-counter and prescription medicines only as told by your health care provider.  Maintain a healthy weight. If you need help losing weight, talk with your health care provider.  Keep your skin clean and dry.  If you have diabetes, keep your blood sugar under control.  Keep all follow-up visits as told by your health care provider. This is important.   Contact a health care provider if:  Your symptoms go away and then return.  Your symptoms do not get better with treatment.  Your symptoms get worse.  Your rash spreads.  You have a fever or chills.  You have new symptoms.  You have new warmth or redness of your skin. Summary  A skin yeast infection is a condition in which there is an overgrowth of yeast (candida) that normally lives on the skin. This condition is caused by a change in the normal balance of the yeast and bacteria that live on the skin.  Take or apply over-the-counter and prescription medicines only as told by your health care provider.  Keep your skin clean and dry.  Contact a health care provider if your symptoms do not get better with treatment. This information is not intended to replace advice given to you by your health care provider. Make sure you discuss any questions you have with your health care provider. Document Revised: 08/28/2017 Document Reviewed: 08/28/2017 Elsevier Patient Education  [place].

## 2020-06-14 NOTE — Progress Notes (Signed)
Subjective:    Patient ID: Alyssa Clay, female    DOB: Mar 27, 1966, 55 y.o.   MRN: [id number]  Chief Complaint  Patient presents with  . Vaginal Itching    Had a vaginal pimple, popped it.     HPI Patient was seen today for acute concern.  Patient endorses bump in vaginal area x several days.  Pt popped the bump, but it has not gone away, is painful, and may be draining.  Pt has not taken anything for it.  Pt notes the skin is irritated with urination.  Past Medical History:  Diagnosis Date  . Asthma   . Depression   . 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 FUO (fever of unknown origin) 10/21/2019  . GERD (gastroesophageal reflux disease)   . Hypertension   . Migraine     No Known Allergies  ROS General: Denies fever, chills, night sweats, changes in weight, changes in appetite HEENT: Denies headaches, ear pain, changes in vision, rhinorrhea, sore throat CV: Denies CP, palpitations, SOB, orthopnea Pulm: Denies SOB, cough, wheezing GI: Denies abdominal pain, nausea, vomiting, diarrhea, constipation GU: Denies dysuria, hematuria, frequency +vaginal bump Msk: Denies muscle cramps, joint pains Neuro: Denies weakness, numbness, tingling Skin: Denies rashes, bruising Psych: Denies depression, anxiety, hallucinations     Objective:    Blood pressure 120/80, pulse 97, temperature 98.6 F (37 C), temperature source Oral, weight 255 lb 3.2 oz (115.8 kg), SpO2 93 %.   Gen. Pleasant, well-nourished, in no distress, normal affect   HEENT: [place]/AT, face symmetric, conjunctiva clear, no scleral icterus, PERRLA, EOMI, nares patent without drainage Lungs: no accessory muscle use Cardiovascular: RRR, no peripheral edema GU: normal external female genitalia, normal urethral meatus and vaginal introitus with scant amount of whitish d/c.  Left medial upper thigh lateral to L labia majora with erythema and a small pustule present.  Scant whitish debris and a few  scattered satellite lesions in the right groin underneath pannus.  Normal anus. Neuro:  A&Ox3, CN II-XII intact, normal gait   Wt Readings from Last 3 Encounters:  06/14/20 255 lb 3.2 oz (115.8 kg)  06/08/20 259 lb (117.5 kg)  05/07/20 261 lb (118.4 kg)    Lab Results  Component Value Date   WBC 6.2 02/25/2020   HGB 13.7 02/25/2020   HCT 41.6 02/25/2020   PLT 344 02/25/2020   GLUCOSE 87 02/25/2020   CHOL 256 (H) 02/25/2020   TRIG 150 (H) 02/25/2020   HDL 51 02/25/2020   LDLCALC 176 (H) 02/25/2020   ALT 19 02/25/2020   AST 18 02/25/2020   NA 138 02/25/2020   K 4.1 02/25/2020   CL 100 02/25/2020   CREATININE 1.09 (H) 02/25/2020   BUN 11 02/25/2020   CO2 29 02/25/2020   TSH 1.98 02/25/2020   HGBA1C 5.7 08/19/2019    Assessment/Plan:  Abscess  -Discussed supportive care including warm compresses, sitz bath -We will start antibiotics -Given precautions -Given handout - Plan: sulfamethoxazole-trimethoprim (BACTRIM DS) 800-160 MG tablet  Yeast dermatitis -Discussed wearing breathable clothing - Plan: fluconazole (DIFLUCAN) 150 MG tablet, nystatin cream (MYCOSTATIN)  Antibiotic-induced yeast infection - Plan: fluconazole (DIFLUCAN) 150 MG tablet  F/u as needed  Grier Mitts, MD

## 2020-06-25 ENCOUNTER — Encounter: Payer: Self-pay | Admitting: Gastroenterology

## 2020-06-29 ENCOUNTER — Telehealth: Payer: Self-pay | Admitting: Allergy

## 2020-06-29 ENCOUNTER — Telehealth: Payer: Self-pay

## 2020-06-29 MED ORDER — BUDESONIDE-FORMOTEROL FUMARATE 160-4.5 MCG/ACT IN AERO: INHALATION_SPRAY | RESPIRATORY_TRACT | 2 refills | Status: DC

## 2020-06-29 MED ORDER — MONTELUKAST SODIUM 10 MG PO TABS: 10.0000 mg | ORAL_TABLET | Freq: Every day | ORAL | 5 refills | Status: DC

## 2020-06-29 MED ORDER — PROAIR RESPICLICK 108 (90 BASE) MCG/ACT IN AEPB: 2.0000 | INHALATION_SPRAY | RESPIRATORY_TRACT | 2 refills | Status: DC | PRN

## 2020-06-29 MED ORDER — ALBUTEROL SULFATE HFA 108 (90 BASE) MCG/ACT IN AERS: 2.0000 | INHALATION_SPRAY | Freq: Four times a day (QID) | RESPIRATORY_TRACT | 2 refills | Status: DC | PRN

## 2020-06-29 NOTE — Addendum Note (Signed)
Addended by: Garnet Sierras on: 06/29/2020 05:11 PM   Modules accepted: Orders

## 2020-06-29 NOTE — Telephone Encounter (Signed)
I reviewed patients chart but I couldn't see where we sent in a respiclick inhaler. The patient does however have a sample of the albuterol respiclick and she says it's really working for her, she uses it before she goes to the gym. She would like a prescription for the respiclick

## 2020-06-29 NOTE — Telephone Encounter (Signed)
[facility] sent a fax requesting refills on Singular, Symbicort, and Albuterol. Refills have been orders for patient. Patient has been notified and agreed to have the medications sent to the pharmacy.

## 2020-06-29 NOTE — Telephone Encounter (Signed)
Okay sent in

## 2020-06-29 NOTE — Telephone Encounter (Signed)
Patient states she has new insurance ([employer]-it has been entered into the system) and needs a prior authorization on her respiclick inhaler and Fasenra. Patient gave me the fax number: [phone].  Please advise.

## 2020-06-30 ENCOUNTER — Other Ambulatory Visit: Payer: Self-pay

## 2020-06-30 MED ORDER — BUPROPION HCL ER (XL) 150 MG PO TB24: 150.0000 mg | ORAL_TABLET | Freq: Every day | ORAL | 1 refills | Status: DC

## 2020-06-30 NOTE — Telephone Encounter (Signed)
Lm for pt to call us back to inform her of the order being sent in by Dr Maudie Mercury

## 2020-06-30 NOTE — Telephone Encounter (Signed)
Patient returned call. I let her know that the order had been sent in. She verbalized understanding.

## 2020-07-01 ENCOUNTER — Telehealth: Payer: Self-pay | Admitting: *Deleted

## 2020-07-01 NOTE — Telephone Encounter (Signed)
Called patient to get her PBM info and L/m again for patient advising approval and submit to new pharmacy as I had earlier advised her [facility]

## 2020-07-01 NOTE — Telephone Encounter (Signed)
Just fyi I called patient in case you need to do approvals her PBM drug plan [facility] and ID [id number]

## 2020-07-02 ENCOUNTER — Telehealth (INDEPENDENT_AMBULATORY_CARE_PROVIDER_SITE_OTHER): Payer: 59 | Admitting: Family Medicine

## 2020-07-02 ENCOUNTER — Other Ambulatory Visit: Payer: Self-pay | Admitting: Family Medicine

## 2020-07-02 ENCOUNTER — Ambulatory Visit (HOSPITAL_COMMUNITY): Payer: Self-pay

## 2020-07-02 ENCOUNTER — Other Ambulatory Visit: Payer: Self-pay | Admitting: *Deleted

## 2020-07-02 ENCOUNTER — Encounter: Payer: Self-pay | Admitting: Family Medicine

## 2020-07-02 DIAGNOSIS — J0100 Acute maxillary sinusitis, unspecified: Secondary | ICD-10-CM | POA: Diagnosis not present

## 2020-07-02 DIAGNOSIS — H9201 Otalgia, right ear: Secondary | ICD-10-CM | POA: Diagnosis not present

## 2020-07-02 MED ORDER — TRAZODONE HCL 100 MG PO TABS: 100.0000 mg | ORAL_TABLET | Freq: Every evening | ORAL | 3 refills | Status: DC | PRN

## 2020-07-02 MED ORDER — PANTOPRAZOLE SODIUM 40 MG PO TBEC: DELAYED_RELEASE_TABLET | ORAL | 1 refills | Status: DC

## 2020-07-02 MED ORDER — FLUTICASONE PROPIONATE 50 MCG/ACT NA SUSP: 1.0000 | Freq: Every day | NASAL | 0 refills | Status: DC

## 2020-07-02 MED ORDER — AMOXICILLIN 500 MG PO TABS: 500.0000 mg | ORAL_TABLET | Freq: Two times a day (BID) | ORAL | 0 refills | Status: AC

## 2020-07-02 MED ORDER — DULOXETINE HCL 60 MG PO CPEP: ORAL_CAPSULE | ORAL | 1 refills | Status: DC

## 2020-07-02 NOTE — Telephone Encounter (Signed)
[facility] faxed a refill request for Trazodone.  Message sent to PCP.

## 2020-07-02 NOTE — Progress Notes (Signed)
Virtual Visit via Video Note  I connected with Alyssa Clay on 07/02/20 at 11:00 AM EST by a video enabled telemedicine application 2/2 ELFYB-01 pandemic and verified that I am speaking with the correct person using two identifiers.  Location patient: home Location provider:work or home office Persons participating in the virtual visit: patient, provider  I discussed the limitations of evaluation and management by telemedicine and the availability of in person appointments. The patient expressed understanding and agreed to proceed.   HPI: Pt is a 55 yo female w/ pmh sig for asthma, depression, GERD, HTN, migraines who is followed by Dr. Ethlyn Gallery and seen for acute concern.  Hearing "squishing and popping in R ear" x 3-4 days.  Endorses numbness in R cheek and sores in R nares x 3 wks.  Notes faint blood on tissue at times when blowing nose.  Using saline nasal rinse, suedafed.  Denies sick contacts.  Has a h/o migraines, endorses HA x 2 days.   ROS: See pertinent positives and negatives per HPI.  Past Medical History:  Diagnosis Date  . Asthma   . Depression   . 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 FUO (fever of unknown origin) 10/21/2019  . GERD (gastroesophageal reflux disease)   . Hypertension   . Migraine     Past Surgical History:  Procedure Laterality Date  . BREAST REDUCTION SURGERY Bilateral 06/12/2019   Procedure: BREAST REDUCTION WITH LIPOSUCTION;  Surgeon: Wallace Going, DO;  Location: [place];  Service: Plastics;  Laterality: Bilateral;  4 hours, please  . CESAREAN SECTION  1994  . DILATION AND CURETTAGE OF UTERUS     multiple due to miscarriages  . KNEE ARTHROSCOPY Left 2013  . LAPAROSCOPIC APPENDECTOMY N/A 12/08/2018   Procedure: APPENDECTOMY LAPAROSCOPIC;  Surgeon: Donnie Mesa, MD;  Location: [place];  Service: General;  Laterality: N/A;  . TENNIS ELBOW RELEASE/NIRSCHEL PROCEDURE  2009  . WISDOM TOOTH  EXTRACTION      Family History  Problem Relation Age of Onset  . Depression Mother   . Lymphoma Mother 80  . Hypertension Father   . Heart attack Father 34  . Hypertension Brother   . High blood pressure Brother   . High Cholesterol Brother   . Healthy Daughter   . Colon cancer Maternal Aunt 6  . Esophageal cancer Neg Hx   . Pancreatic cancer Neg Hx   . Stomach cancer Neg Hx      Current Outpatient Medications:  .  albuterol (PROVENTIL) (2.5 MG/3ML) 0.083% nebulizer solution, Take 2.5 mg by nebulization every 6 (six) hours as needed for wheezing or shortness of breath., Disp: , Rfl:  .  Albuterol Sulfate (PROAIR RESPICLICK) 751 (90 Base) MCG/ACT AEPB, Inhale 2 puffs into the lungs every 4 (four) hours as needed (coughing, wheezing, shortness of breath)., Disp: 1 each, Rfl: 2 .  budesonide-formoterol (SYMBICORT) 160-4.5 MCG/ACT inhaler, TAKE 2 PUFFS BY MOUTH TWICE A DAY, Disp: 10.2 each, Rfl: 2 .  buPROPion (WELLBUTRIN XL) 150 MG 24 hr tablet, Take 1 tablet (150 mg total) by mouth daily., Disp: 90 tablet, Rfl: 1 .  calcium carbonate (OSCAL) 1500 (600 Ca) MG TABS tablet, Take 600 mg of elemental calcium by mouth 2 (two) times daily with a meal., Disp: , Rfl:  .  cyclobenzaprine (FLEXERIL) 10 MG tablet, Take 10 mg by mouth 3 (three) times daily as needed (migraines)., Disp: , Rfl:  .  DULoxetine (CYMBALTA) 60  MG capsule, TAKE 1 CAPSULE BY MOUTH EVERY DAY, Disp: 90 capsule, Rfl: 2 .  Erenumab-aooe (AIMOVIG) 140 MG/ML SOAJ, INJECT 140 MG INTO THE SKIN EVERY 30 (THIRTY) DAYS., Disp: , Rfl:  .  FASENRA 30 MG/ML SOSY, SECOND SHIP: INJECT ONE SYRINGE UNDER THE SKIN AT WEEK 4 AND 8, THEN EVERY 8 WEEKS THEREAFTER. (Patient taking differently: Inject 30 mg into the skin See admin instructions. Every 8 weeks), Disp: 1 Syringe, Rfl: 8 .  fluticasone (FLOVENT HFA) 110 MCG/ACT inhaler, Inhale 2 puffs into the lungs 2 (two) times daily. During respiratory infections/asthma flares., Disp: 1 Inhaler,  Rfl: 5 .  gabapentin (NEURONTIN) 300 MG capsule, Take 300-900 mg by mouth See admin instructions. Take 1 tablet every AM, three tablets at bedtime, Disp: , Rfl:  .  hydrOXYzine (ATARAX/VISTARIL) 25 MG tablet, Take 25 mg by mouth 3 (three) times daily as needed., Disp: , Rfl:  .  meloxicam (MOBIC) 15 MG tablet, Take 1 tablet (15 mg total) by mouth daily., Disp: 30 tablet, Rfl: 2 .  montelukast (SINGULAIR) 10 MG tablet, Take 1 tablet (10 mg total) by mouth daily., Disp: 30 tablet, Rfl: 5 .  Multiple Vitamin (MULTIVITAMIN WITH MINERALS) TABS tablet, Take 1 tablet by mouth daily., Disp: , Rfl:  .  nystatin cream (MYCOSTATIN), Apply 1 application topically 2 (two) times daily., Disp: 30 g, Rfl: 0 .  pantoprazole (PROTONIX) 40 MG tablet, TAKE 1 TABLET BY MOUTH 2 TIMES DAILY BEFORE A MEAL., Disp: 60 tablet, Rfl: 3 .  Respiratory Therapy Supplies (FLUTTER) DEVI, Use 3 times daily as directed, Disp: 1 each, Rfl: 0 .  Rimegepant Sulfate (NURTEC) 75 MG TBDP, Take by mouth., Disp: , Rfl:  .  tiZANidine (ZANAFLEX) 4 MG tablet, Take 4 mg by mouth every 6 (six) hours as needed for muscle spasms., Disp: , Rfl:  .  traZODone (DESYREL) 100 MG tablet, TAKE 1-2 TABLETS (100-200 MG TOTAL) BY MOUTH AT BEDTIME AS NEEDED FOR SLEEP., Disp: 180 tablet, Rfl: 2 .  valACYclovir (VALTREX) 1000 MG tablet, Take 1 tablet (1,000 mg total) by mouth 2 (two) times daily. (Patient taking differently: Take 1,000 mg by mouth as needed.), Disp: 18 tablet, Rfl: 3  EXAM:  VITALS per patient if applicable: RR between 16-60 bpm  GENERAL: alert, oriented, appears well and in no acute distress  HEENT: atraumatic, conjunctiva clear, no obvious abnormalities on inspection of external nose and ears.  Mild increase in pressure of R maxillary sinus with leaning forward.  NECK: normal movements of the head and neck  LUNGS: on inspection no signs of respiratory distress, breathing rate appears normal, no obvious gross SOB, gasping or  wheezing  CV: no obvious cyanosis  MS: moves all visible extremities without noticeable abnormality  PSYCH/NEURO: pleasant and cooperative, no obvious depression or anxiety, speech and thought processing grossly intact  ASSESSMENT AND PLAN:  Discussed the following assessment and plan:  Subacute maxillary sinusitis -Supportive care - Plan: amoxicillin (AMOXIL) 500 MG tablet, fluticasone (FLONASE) 50 MCG/ACT nasal spray  Right ear pain -Symptoms possibly 2/2 eustachian tube dysfunction, ongoing sinusitis, or AOM. -Continue supportive care -We will start amoxicillin and Flonase  Follow-up as needed.   I discussed the assessment and treatment plan with the patient. The patient was provided an opportunity to ask questions and all were answered. The patient agreed with the plan and demonstrated an understanding of the instructions.   The patient was advised to call back or seek an in-person evaluation if the symptoms worsen  or if the condition fails to improve as anticipated.   Billie Ruddy, MD

## 2020-07-14 ENCOUNTER — Other Ambulatory Visit: Payer: Self-pay

## 2020-07-15 ENCOUNTER — Ambulatory Visit (INDEPENDENT_AMBULATORY_CARE_PROVIDER_SITE_OTHER): Payer: 59 | Admitting: Internal Medicine

## 2020-07-15 ENCOUNTER — Encounter: Payer: Self-pay | Admitting: Internal Medicine

## 2020-07-15 ENCOUNTER — Other Ambulatory Visit: Payer: Self-pay | Admitting: *Deleted

## 2020-07-15 VITALS — BP 120/80 | HR 97 | Temp 98.0°F | Wt 253.2 lb

## 2020-07-15 DIAGNOSIS — H66011 Acute suppurative otitis media with spontaneous rupture of ear drum, right ear: Secondary | ICD-10-CM

## 2020-07-15 DIAGNOSIS — J0100 Acute maxillary sinusitis, unspecified: Secondary | ICD-10-CM | POA: Diagnosis not present

## 2020-07-15 MED ORDER — AMOXICILLIN-POT CLAVULANATE 875-125 MG PO TABS: 1.0000 | ORAL_TABLET | Freq: Two times a day (BID) | ORAL | 0 refills | Status: DC

## 2020-07-15 MED ORDER — VALACYCLOVIR HCL 1 G PO TABS: 1000.0000 mg | ORAL_TABLET | Freq: Two times a day (BID) | ORAL | 1 refills | Status: DC

## 2020-07-15 MED ORDER — FLUCONAZOLE 150 MG PO TABS
150.0000 mg | ORAL_TABLET | Freq: Once | ORAL | 0 refills | Status: AC
Start: 2020-07-15 — End: 2020-07-15

## 2020-07-15 NOTE — Telephone Encounter (Signed)
Rx done. 

## 2020-07-15 NOTE — Progress Notes (Signed)
Acute office Visit     This visit occurred during the SARS-CoV-2 public health emergency.  Safety protocols were in place, including screening questions prior to the visit, additional usage of staff PPE, and extensive cleaning of exam room while observing appropriate contact time as indicated for disinfecting solutions.    CC/Reason for Visit: Right ear and nasal pain  HPI: Alyssa Clay is a 55 y.o. female who is coming in today for the above mentioned reasons.  For the past 3 weeks now she has been having severe right-sided ear pain with a "swishing and popping" sound.  She also has severe pain over her right maxillary and frontal sinuses.  She had a virtual visit with another clinic provider on the 11th and was given amoxicillin which she took without relief.  Past Medical/Surgical History: Past Medical History:  Diagnosis Date  . Asthma   . Depression   . 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 FUO (fever of unknown origin) 10/21/2019  . GERD (gastroesophageal reflux disease)   . Hypertension   . Migraine     Past Surgical History:  Procedure Laterality Date  . BREAST REDUCTION SURGERY Bilateral 06/12/2019   Procedure: BREAST REDUCTION WITH LIPOSUCTION;  Surgeon: Wallace Going, DO;  Location: [place];  Service: Plastics;  Laterality: Bilateral;  4 hours, please  . CESAREAN SECTION  1994  . DILATION AND CURETTAGE OF UTERUS     multiple due to miscarriages  . KNEE ARTHROSCOPY Left 2013  . LAPAROSCOPIC APPENDECTOMY N/A 12/08/2018   Procedure: APPENDECTOMY LAPAROSCOPIC;  Surgeon: Donnie Mesa, MD;  Location: [place];  Service: General;  Laterality: N/A;  . TENNIS ELBOW RELEASE/NIRSCHEL PROCEDURE  2009  . WISDOM TOOTH EXTRACTION      Social History:  reports that she has never smoked. She has never used smokeless tobacco. She reports current alcohol use. She reports that she does not use drugs.  Allergies: No Known  Allergies  Family History:  Family History  Problem Relation Age of Onset  . Depression Mother   . Lymphoma Mother 56  . Hypertension Father   . Heart attack Father 71  . Hypertension Brother   . High blood pressure Brother   . High Cholesterol Brother   . Healthy Daughter   . Colon cancer Maternal Aunt 43  . Esophageal cancer Neg Hx   . Pancreatic cancer Neg Hx   . Stomach cancer Neg Hx      Current Outpatient Medications:  .  albuterol (PROVENTIL) (2.5 MG/3ML) 0.083% nebulizer solution, Take 2.5 mg by nebulization every 6 (six) hours as needed for wheezing or shortness of breath., Disp: , Rfl:  .  Albuterol Sulfate (PROAIR RESPICLICK) 619 (90 Base) MCG/ACT AEPB, Inhale 2 puffs into the lungs every 4 (four) hours as needed (coughing, wheezing, shortness of breath)., Disp: 1 each, Rfl: 2 .  amoxicillin-clavulanate (AUGMENTIN) 875-125 MG tablet, Take 1 tablet by mouth 2 (two) times daily for 7 days., Disp: 20 tablet, Rfl: 0 .  budesonide-formoterol (SYMBICORT) 160-4.5 MCG/ACT inhaler, TAKE 2 PUFFS BY MOUTH TWICE A DAY, Disp: 10.2 each, Rfl: 2 .  buPROPion (WELLBUTRIN XL) 150 MG 24 hr tablet, Take 1 tablet (150 mg total) by mouth daily., Disp: 90 tablet, Rfl: 1 .  calcium carbonate (OSCAL) 1500 (600 Ca) MG TABS tablet, Take 600 mg of elemental calcium by mouth 2 (two) times daily with a meal., Disp: , Rfl:  .  cyclobenzaprine (FLEXERIL) 10 MG tablet, Take 10 mg by mouth 3 (three) times daily as needed (migraines)., Disp: , Rfl:  .  DULoxetine (CYMBALTA) 60 MG capsule, TAKE 1 CAPSULE BY MOUTH EVERY DAY, Disp: 90 capsule, Rfl: 1 .  Erenumab-aooe (AIMOVIG) 140 MG/ML SOAJ, INJECT 140 MG INTO THE SKIN EVERY 30 (THIRTY) DAYS., Disp: , Rfl:  .  FASENRA 30 MG/ML SOSY, SECOND SHIP: INJECT ONE SYRINGE UNDER THE SKIN AT WEEK 4 AND 8, THEN EVERY 8 WEEKS THEREAFTER. (Patient taking differently: Inject 30 mg into the skin See admin instructions. Every 8 weeks), Disp: 1 Syringe, Rfl: 8 .  fluconazole  (DIFLUCAN) 150 MG tablet, Take 1 tablet (150 mg total) by mouth once for 1 dose., Disp: 1 tablet, Rfl: 0 .  fluticasone (FLOVENT HFA) 110 MCG/ACT inhaler, Inhale 2 puffs into the lungs 2 (two) times daily. During respiratory infections/asthma flares., Disp: 1 Inhaler, Rfl: 5 .  gabapentin (NEURONTIN) 300 MG capsule, Take 300-900 mg by mouth See admin instructions. Take 1 tablet every AM, three tablets at bedtime, Disp: , Rfl:  .  hydrOXYzine (ATARAX/VISTARIL) 25 MG tablet, Take 25 mg by mouth 3 (three) times daily as needed., Disp: , Rfl:  .  meloxicam (MOBIC) 15 MG tablet, Take 1 tablet (15 mg total) by mouth daily., Disp: 30 tablet, Rfl: 2 .  montelukast (SINGULAIR) 10 MG tablet, Take 1 tablet (10 mg total) by mouth daily., Disp: 30 tablet, Rfl: 5 .  Multiple Vitamin (MULTIVITAMIN WITH MINERALS) TABS tablet, Take 1 tablet by mouth daily., Disp: , Rfl:  .  pantoprazole (PROTONIX) 40 MG tablet, TAKE 1 TABLET BY MOUTH 2 TIMES DAILY BEFORE A MEAL., Disp: 180 tablet, Rfl: 1 .  Respiratory Therapy Supplies (FLUTTER) DEVI, Use 3 times daily as directed, Disp: 1 each, Rfl: 0 .  Rimegepant Sulfate (NURTEC) 75 MG TBDP, Take by mouth., Disp: , Rfl:  .  tiZANidine (ZANAFLEX) 4 MG tablet, Take 4 mg by mouth every 6 (six) hours as needed for muscle spasms., Disp: , Rfl:  .  traZODone (DESYREL) 100 MG tablet, Take 1-2 tablets (100-200 mg total) by mouth at bedtime as needed for sleep., Disp: 180 tablet, Rfl: 3 .  valACYclovir (VALTREX) 1000 MG tablet, Take 1 tablet (1,000 mg total) by mouth 2 (two) times daily. (Patient taking differently: Take 1,000 mg by mouth as needed.), Disp: 18 tablet, Rfl: 3 .  fluticasone (FLONASE) 50 MCG/ACT nasal spray, Place 1 spray into both nostrils daily. (Patient not taking: Reported on 07/15/2020), Disp: 16 g, Rfl: 0 .  nystatin cream (MYCOSTATIN), Apply 1 application topically 2 (two) times daily. (Patient not taking: Reported on 07/15/2020), Disp: 30 g, Rfl: 0  Review of Systems:   Constitutional: Denies fever, chills, diaphoresis, appetite change and fatigue.  HEENT: Denies photophobia, eye pain, redness,  mouth sores, trouble swallowing, neck pain, neck stiffness and tinnitus.   Respiratory: Denies SOB, DOE, cough, chest tightness,  and wheezing.   Cardiovascular: Denies chest pain, palpitations and leg swelling.  Gastrointestinal: Denies nausea, vomiting, abdominal pain, diarrhea, constipation, blood in stool and abdominal distention.  Genitourinary: Denies dysuria, urgency, frequency, hematuria, flank pain and difficulty urinating.  Endocrine: Denies: hot or cold intolerance, sweats, changes in hair or nails, polyuria, polydipsia. Musculoskeletal: Denies myalgias, back pain, joint swelling, arthralgias and gait problem.  Skin: Denies pallor, rash and wound.  Neurological: Denies dizziness, seizures, syncope, weakness, light-headedness, numbness and headaches.  Hematological: Denies adenopathy. Easy bruising, personal or family bleeding history  Psychiatric/Behavioral: Denies suicidal  ideation, mood changes, confusion, nervousness, sleep disturbance and agitation    Physical Exam: Vitals:   07/15/20 1401  BP: 120/80  Pulse: 97  Temp: 98 F (36.7 C)  TempSrc: Oral  SpO2: 98%  Weight: 253 lb 3.2 oz (114.9 kg)    Body mass index is 41.49 kg/m.   Constitutional: NAD, calm, comfortable Eyes: PERRL, lids and conjunctivae normal ENMT: Mucous membranes are moist. Posterior pharynx is erythematous but no exudates.. Normal dentition.  Right tympanic membrane is bulging with air-fluid levels and what appears to be a small perforation. Neck: normal, supple, no masses, no thyromegaly Neurologic: Grossly intact and nonfocal Psychiatric: Normal judgment and insight. Alert and oriented x 3. Normal mood.    Impression and Plan:  Acute suppurative otitis media of right ear with spontaneous rupture of tympanic membrane, recurrence not specified Acute maxillary  sinusitis, recurrence not specified  -Suspect amoxicillin treatment failure. -Prescribed Augmentin 875 mg twice daily for 7 days. -Have also advised that she add Mucinex D and an antihistamine for the duration of her treatment. -Suspect that her small right tympanic membrane perforation will heal spontaneously but she is instructed to follow-up with [place] in 2 to 3 weeks if issues persist. -She is requesting some Diflucan as she typically gets vaginal yeast infections with antibiotic therapy.    Lelon Frohlich, MD [facility]

## 2020-07-16 ENCOUNTER — Other Ambulatory Visit: Payer: Self-pay | Admitting: *Deleted

## 2020-07-16 ENCOUNTER — Other Ambulatory Visit: Payer: Self-pay

## 2020-07-16 MED ORDER — ALBUTEROL SULFATE HFA 108 (90 BASE) MCG/ACT IN AERS: INHALATION_SPRAY | RESPIRATORY_TRACT | 1 refills | Status: DC

## 2020-07-16 MED ORDER — ALBUTEROL SULFATE HFA 108 (90 BASE) MCG/ACT IN AERS: 2.0000 | INHALATION_SPRAY | RESPIRATORY_TRACT | 1 refills | Status: DC | PRN

## 2020-07-19 NOTE — Progress Notes (Deleted)
   I, Peterson Lombard, LAT, ATC acting as a scribe for Lynne Leader, MD.  Alyssa Clay is a 55 y.o. female who presents to [place] at [facility] today for f/u R shoulder pain. Pt was last seen by Dr. Georgina Snell on 06/08/20 and was given a subacromial steroid injection and advised to cont HEP and PT, of which she's completed ? visits. Today, pt reports   Dx imaging: 06/05/20 R shoulder MRI             05/07/20 R shoulder XR  Pertinent review of systems: ***  Relevant historical information: ***   Exam:  There were no vitals taken for this visit. General: Well Developed, well nourished, and in no acute distress.   MSK: ***    Lab and Radiology Results No results found for this or any previous visit (from the past 72 hour(s)). No results found.     Assessment and Plan: 55 y.o. female with ***   PDMP not reviewed this encounter. No orders of the defined types were placed in this encounter.  No orders of the defined types were placed in this encounter.    Discussed warning signs or symptoms. Please see discharge instructions. Patient expresses understanding.   ***

## 2020-07-20 ENCOUNTER — Ambulatory Visit: Payer: 59 | Admitting: Family Medicine

## 2020-07-22 ENCOUNTER — Other Ambulatory Visit: Payer: Self-pay

## 2020-07-23 ENCOUNTER — Ambulatory Visit (INDEPENDENT_AMBULATORY_CARE_PROVIDER_SITE_OTHER): Payer: 59 | Admitting: Family Medicine

## 2020-07-23 ENCOUNTER — Encounter: Payer: Self-pay | Admitting: Family Medicine

## 2020-07-23 VITALS — BP 140/78 | HR 96 | Temp 98.4°F | Wt 255.8 lb

## 2020-07-23 DIAGNOSIS — H6981 Other specified disorders of Eustachian tube, right ear: Secondary | ICD-10-CM

## 2020-07-23 DIAGNOSIS — M545 Low back pain, unspecified: Secondary | ICD-10-CM | POA: Diagnosis not present

## 2020-07-23 DIAGNOSIS — M26609 Unspecified temporomandibular joint disorder, unspecified side: Secondary | ICD-10-CM

## 2020-07-23 DIAGNOSIS — S39012A Strain of muscle, fascia and tendon of lower back, initial encounter: Secondary | ICD-10-CM

## 2020-07-23 DIAGNOSIS — H66011 Acute suppurative otitis media with spontaneous rupture of ear drum, right ear: Secondary | ICD-10-CM

## 2020-07-23 DIAGNOSIS — J0100 Acute maxillary sinusitis, unspecified: Secondary | ICD-10-CM

## 2020-07-23 NOTE — Patient Instructions (Addendum)
-You can start taking the Mucinex D. -Continue taking Flonase nasal spray and using the saline nasal rinse. -Continue taking the Mobic as needed.   -You can also take half of a Claritin to see if this causes less drowsiness.  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  -It is a good idea that you follow-up with your dentist to obtain a new mouthguard.   Lumbar Strain A lumbar strain, which is sometimes called a low-back strain, is a stretch or tear in a muscle or the strong cords of tissue that attach muscle to bone (tendons) in the lower back (lumbar spine). This type of injury occurs when muscles or tendons are torn or are stretched beyond their limits. Lumbar strains can range from mild to severe. Mild strains may involve stretching a muscle or tendon without tearing it. These may heal in 1-2 weeks. More severe strains involve tearing of muscle fibers or tendons. These will cause more pain and may take 6-8 weeks to heal. What are the causes? This condition may be caused by:  Trauma, such as a fall or a hit to the body.  Twisting or overstretching the back. This may result from doing activities that need a lot of energy, such as lifting heavy objects. What increases the risk? This injury is more common in:  Athletes.  People with obesity.  People who do repeated lifting, bending, or other movements that involve their back. What are the signs or symptoms? Symptoms of this condition may include:  Sharp or dull pain in the lower back that does not go away. The pain may extend to the buttocks.  Stiffness or limited range of motion.  Sudden muscle tightening (spasms). How is this diagnosed? This condition may be diagnosed based on:  Your symptoms.  Your medical history.  A physical exam.  Imaging tests, such as: ? X-rays. ? MRI. How is this  treated? Treatment for this condition may include:  Rest.  Applying heat and cold to the affected area.  Over-the-counter medicines to help relieve pain and inflammation, such as NSAIDs.  Prescription pain medicine and muscle relaxants may be needed for a short time.  Physical therapy. Follow these instructions at home: Managing pain, stiffness, and swelling  If directed, put ice on the injured area during the first 24 hours after your injury. ? Put ice in a plastic bag. ? Place a towel between your skin and the bag. ? Leave the ice on for 20 minutes, 2-3 times a day.  If directed, apply heat to the affected area as often as told by your health care provider. Use the heat source that your health care provider recommends, such as a moist heat pack or a heating pad. ? Place a towel between your skin and the heat source. ? Leave the heat on for 20-30 minutes. ? Remove the heat if your skin turns bright red. This is especially important if you are unable to feel pain, heat, or cold. You may have a greater risk of getting burned.      Activity  Rest and return to your normal activities as told by your health care provider. Ask your health care provider what activities are safe for you.  Do exercises as told by your health care provider. Medicines  Take over-the-counter and prescription medicines only as told by your health care  provider.  Ask your health care provider if the medicine prescribed to you: ? Requires you to avoid driving or using heavy machinery. ? Can cause constipation. You may need to take these actions to prevent or treat constipation:  Drink enough fluid to keep your urine pale yellow.  Take over-the-counter or prescription medicines.  Eat foods that are high in fiber, such as beans, whole grains, and fresh fruits and vegetables.  Limit foods that are high in fat and processed sugars, such as fried or sweet foods. Injury prevention To prevent a future  low-back injury:  Always warm up properly before physical activity or sports.  Cool down and stretch after being active.  Use correct form when playing sports and lifting heavy objects. Bend your knees before you lift heavy objects.  Use good posture when sitting and standing.  Stay physically fit and keep a healthy weight. ? Do at least 150 minutes of moderate-intensity exercise each week, such as brisk walking or water aerobics. ? Do strength exercises at least 2 times each week.   General instructions  Do not use any products that contain nicotine or tobacco, such as cigarettes, e-cigarettes, and chewing tobacco. If you need help quitting, ask your health care provider.  Keep all follow-up visits as told by your health care provider. This is important. Contact a health care provider if:  Your back pain does not improve after 6 weeks of treatment.  Your symptoms get worse. Get help right away if:  Your back pain is severe.  You are unable to stand or walk.  You develop pain in your legs.  You develop weakness in your buttocks or legs.  You have difficulty controlling when you urinate or when you have a bowel movement. ? You have frequent, painful, or bloody urination. ? You have a temperature over 101.52F (38.3C) Summary  A lumbar strain, which is sometimes called a low-back strain, is a stretch or tear in a muscle or the strong cords of tissue that attach muscle to bone (tendons) in the lower back (lumbar spine).  This type of injury occurs when muscles or tendons are torn or are stretched beyond their limits.  Rest and return to your normal activities as told by your health care provider. If directed, apply heat and ice to the affected area as often as told by your health care provider.  Take over-the-counter and prescription medicines only as told by your health care provider.  Contact a health care provider if you have new or worsening symptoms. This information  is not intended to replace advice given to you by your health care provider. Make sure you discuss any questions you have with your health care provider. Document Revised: 02/07/2018 Document Reviewed: 02/07/2018 Elsevier Patient Education  2021 [place].  Temporomandibular Joint Syndrome  Temporomandibular joint syndrome (TMJ syndrome) is a condition that causes pain in the temporomandibular joints. These joints are located near your ears and allow your jaw to open and close. For people with TMJ syndrome, chewing, biting, or other movements of the jaw can be difficult or painful. TMJ syndrome is often mild and goes away within a few weeks. However, sometimes the condition becomes a long-term (chronic) problem. What are the causes? This condition may be caused by:  Grinding your teeth or clenching your jaw. Some people do this when they are under stress.  Arthritis.  Injury to the jaw.  Head or neck injury.  Teeth or dentures that are not aligned well. In  some cases, the cause of TMJ syndrome may not be known. What are the signs or symptoms? The most common symptom of this condition is an aching pain on the side of the head in the area of the TMJ. Other symptoms may include:  Pain when moving your jaw, such as when chewing or biting.  Being unable to open your jaw all the way.  Making a clicking sound when you open your mouth.  Headache.  Earache.  Neck or shoulder pain. How is this diagnosed? This condition may be diagnosed based on:  Your symptoms and medical history.  A physical exam. Your health care provider may check the range of motion of your jaw.  Imaging tests, such as X-rays or an MRI. You may also need to see your dentist, who will determine if your teeth and jaw are lined up correctly. How is this treated? TMJ syndrome often goes away on its own. If treatment is needed, the options may include:  Eating soft foods and applying ice or heat.  Medicines  to relieve pain or inflammation.  Medicines or massage to relax the muscles.  A splint, bite plate, or mouthpiece to prevent teeth grinding or jaw clenching.  Relaxation techniques or counseling to help reduce stress.  A therapy for pain in which an electrical current is applied to the nerves through the skin (transcutaneous electrical nerve stimulation).  Acupuncture. This is sometimes helpful to relieve pain.  Jaw surgery. This is rarely needed. Follow these instructions at home: Eating and drinking  Eat a soft diet if you are having trouble chewing.  Avoid foods that require a lot of chewing. Do not chew gum. General instructions  Take over-the-counter and prescription medicines only as told by your health care provider.  If directed, put ice on the painful area. ? Put ice in a plastic bag. ? Place a towel between your skin and the bag. ? Leave the ice on for 20 minutes, 2-3 times a day.  Apply a warm, wet cloth (warm compress) to the painful area as directed.  Massage your jaw area and do any jaw stretching exercises as told by your health care provider.  If you were given a splint, bite plate, or mouthpiece, wear it as told by your health care provider.  Keep all follow-up visits as told by your health care provider. This is important.   Contact a health care provider if:  You are having trouble eating.  You have new or worsening symptoms. Get help right away if:  Your jaw locks open or closed. Summary  Temporomandibular joint syndrome (TMJ syndrome) is a condition that causes pain in the temporomandibular joints. These joints are located near your ears and allow your jaw to open and close.  TMJ syndrome is often mild and goes away within a few weeks. However, sometimes the condition becomes a long-term (chronic) problem.  Symptoms include an aching pain on the side of the head in the area of the TMJ, pain when chewing or biting, and being unable to open your jaw  all the way. You may also make a clicking sound when you open your mouth.  TMJ syndrome often goes away on its own. If treatment is needed, it may include medicines to relieve pain, reduce inflammation, or relax the muscles. A splint, bite plate, or mouthpiece may also be used to prevent teeth grinding or jaw clenching. This information is not intended to replace advice given to you by your health care provider. Make  sure you discuss any questions you have with your health care provider. Document Revised: 06/22/2017 Document Reviewed: 05/22/2017 Elsevier Patient Education  [place].  Eustachian Tube Dysfunction  Eustachian tube dysfunction refers to a condition in which a blockage develops in the narrow passage that connects the middle ear to the back of the nose (eustachian tube). The eustachian tube regulates air pressure in the middle ear by letting air move between the ear and nose. It also helps to drain fluid from the middle ear space. Eustachian tube dysfunction can affect one or both ears. When the eustachian tube does not function properly, air pressure, fluid, or both can build up in the middle ear. What are the causes? This condition occurs when the eustachian tube becomes blocked or cannot open normally. Common causes of this condition include:  Ear infections.  Colds and other infections that affect the nose, mouth, and throat (upper respiratory tract).  Allergies.  Irritation from cigarette smoke.  Irritation from stomach acid coming up into the esophagus (gastroesophageal reflux). The esophagus is the tube that carries food from the mouth to the stomach.  Sudden changes in air pressure, such as from descending in an airplane or scuba diving.  Abnormal growths in the nose or throat, such as: ? Growths that line the nose (nasal polyps). ? Abnormal growth of cells (tumors). ? Enlarged tissue at the back of the throat (adenoids). What increases the risk? You are  more likely to develop this condition if:  You smoke.  You are overweight.  You are a child who has: ? Certain birth defects of the mouth, such as cleft palate. ? Large tonsils or adenoids. What are the signs or symptoms? Common symptoms of this condition include:  A feeling of fullness in the ear.  Ear pain.  Clicking or popping noises in the ear.  Ringing in the ear.  Hearing loss.  Loss of balance.  Dizziness. Symptoms may get worse when the air pressure around you changes, such as when you travel to an area of high elevation, fly on an airplane, or go scuba diving. How is this diagnosed? This condition may be diagnosed based on:  Your symptoms.  A physical exam of your ears, nose, and throat.  Tests, such as those that measure: ? The movement of your eardrum (tympanogram). ? Your hearing (audiometry). How is this treated? Treatment depends on the cause and severity of your condition.  In mild cases, you may relieve your symptoms by moving air into your ears. This is called "popping the ears."  In more severe cases, or if you have symptoms of fluid in your ears, treatment may include: ? Medicines to relieve congestion (decongestants). ? Medicines that treat allergies (antihistamines). ? Nasal sprays or ear drops that contain medicines that reduce swelling (steroids). ? A procedure to drain the fluid in your eardrum (myringotomy). In this procedure, a small tube is placed in the eardrum to:  Drain the fluid.  Restore the air in the middle ear space. ? A procedure to insert a balloon device through the nose to inflate the opening of the eustachian tube (balloon dilation). Follow these instructions at home: Lifestyle  Do not do any of the following until your health care provider approves: ? Travel to high altitudes. ? Fly in airplanes. ? Work in a [employer] scheme manager or room. ? Scuba dive.  Do not use any products that contain nicotine or tobacco, such as  cigarettes and e-cigarettes. If you need help quitting,  ask your health care provider.  Keep your ears dry. Wear fitted earplugs during showering and bathing. Dry your ears completely after. General instructions  Take over-the-counter and prescription medicines only as told by your health care provider.  Use techniques to help pop your ears as recommended by your health care provider. These may include: ? Chewing gum. ? Yawning. ? Frequent, forceful swallowing. ? Closing your mouth, holding your nose closed, and gently blowing as if you are trying to blow air out of your nose.  Keep all follow-up visits as told by your health care provider. This is important. Contact a health care provider if:  Your symptoms do not go away after treatment.  Your symptoms come back after treatment.  You are unable to pop your ears.  You have: ? A fever. ? Pain in your ear. ? Pain in your head or neck. ? Fluid draining from your ear.  Your hearing suddenly changes.  You become very dizzy.  You lose your balance. Summary  Eustachian tube dysfunction refers to a condition in which a blockage develops in the eustachian tube.  It can be caused by ear infections, allergies, inhaled irritants, or abnormal growths in the nose or throat.  Symptoms include ear pain, hearing loss, or ringing in the ears.  Mild cases are treated with maneuvers to unblock the ears, such as yawning or ear popping.  Severe cases are treated with medicines. Surgery may also be done (rare). This information is not intended to replace advice given to you by your health care provider. Make sure you discuss any questions you have with your health care provider. Document Revised: 07/31/2017 Document Reviewed: 07/31/2017 Elsevier Patient Education  2021 [place].  Otitis Media, Adult  Otitis media is a condition in which the middle ear is red and swollen (inflamed) and full of fluid. The middle ear is the part of the  ear that contains bones for hearing as well as air that helps send sounds to the brain. The condition usually goes away on its own. What are the causes? This condition is caused by a blockage in the eustachian tube. The eustachian tube connects the middle ear to the back of the nose. It normally allows air into the middle ear. The blockage is caused by fluid or swelling. Problems that can cause blockage include:  A cold or infection that affects the nose, mouth, or throat.  Allergies.  An irritant, such as tobacco smoke.  Adenoids that have become large. The adenoids are soft tissue located in the back of the throat, behind the nose and the roof of the mouth.  Growth or swelling in the upper part of the throat, just behind the nose (nasopharynx).  Damage to the ear caused by change in pressure. This is called barotrauma. What are the signs or symptoms? Symptoms of this condition include:  Ear pain.  Fever.  Problems with hearing.  Being tired.  Fluid leaking from the ear.  Ringing in the ear. How is this treated? This condition can go away on its own within 3-5 days. But if the condition is caused by bacteria or does not go away on its own, or if it keeps coming back, your doctor may:  Give you antibiotic medicines.  Give you medicines for pain. Follow these instructions at home:  Take over-the-counter and prescription medicines only as told by your doctor.  If you were prescribed an antibiotic medicine, take it as told by your doctor. Do not stop taking  the antibiotic even if you start to feel better.  Keep all follow-up visits as told by your doctor. This is important. Contact a doctor if:  You have bleeding from your nose.  There is a lump on your neck.  You are not feeling better in 5 days.  You feel worse instead of better. Get help right away if:  You have pain that is not helped with medicine.  You have swelling, redness, or pain around your ear.  You  get a stiff neck.  You cannot move part of your face (paralysis).  You notice that the bone behind your ear hurts when you touch it.  You get a very bad headache. Summary  Otitis media means that the middle ear is red, swollen, and full of fluid.  This condition usually goes away on its own.  If the problem does not go away, treatment may be needed. You may be given medicines to treat the infection or to treat your pain.  If you were prescribed an antibiotic medicine, take it as told by your doctor. Do not stop taking the antibiotic even if you start to feel better.  Keep all follow-up visits as told by your doctor. This is important. This information is not intended to replace advice given to you by your health care provider. Make sure you discuss any questions you have with your health care provider. Document Revised: 03/13/2019 Document Reviewed: 03/13/2019 Elsevier Patient Education  2021 [place] or Strain Rehab Ask your health care provider which exercises are safe for you. Do exercises exactly as told by your health care provider and adjust them as directed. It is normal to feel mild stretching, pulling, tightness, or discomfort as you do these exercises. Stop right away if you feel sudden pain or your pain gets worse. Do not begin these exercises until told by your health care provider. Stretching and range-of-motion exercises These exercises warm up your muscles and joints and improve the movement and flexibility of your back. These exercises also help to relieve pain, numbness, and tingling. Lumbar rotation 1. Lie on your back on a firm surface and bend your knees. 2. Straighten your arms out to your sides so each arm forms a 90-degree angle (right angle) with a side of your body. 3. Slowly move (rotate) both of your knees to one side of your body until you feel a stretch in your lower back (lumbar). Try not to let your shoulders lift off the  floor. 4. Hold this position for __________ seconds. 5. Tense your abdominal muscles and slowly move your knees back to the starting position. 6. Repeat this exercise on the other side of your body. Repeat __________ times. Complete this exercise __________ times a day.   Single knee to chest 1. Lie on your back on a firm surface with both legs straight. 2. Bend one of your knees. Use your hands to move your knee up toward your chest until you feel a gentle stretch in your lower back and buttock. ? Hold your leg in this position by holding on to the front of your knee. ? Keep your other leg as straight as possible. 3. Hold this position for __________ seconds. 4. Slowly return to the starting position. 5. Repeat with your other leg. Repeat __________ times. Complete this exercise __________ times a day.   Prone extension on elbows 1. Lie on your abdomen on a firm surface (prone position). 2. Prop yourself up on your  elbows. 3. Use your arms to help lift your chest up until you feel a gentle stretch in your abdomen and your lower back. ? This will place some of your body weight on your elbows. If this is uncomfortable, try stacking pillows under your chest. ? Your hips should stay down, against the surface that you are lying on. Keep your hip and back muscles relaxed. 4. Hold this position for __________ seconds. 5. Slowly relax your upper body and return to the starting position. Repeat __________ times. Complete this exercise __________ times a day.   Strengthening exercises These exercises build strength and endurance in your back. Endurance is the ability to use your muscles for a long time, even after they get tired. Pelvic tilt This exercise strengthens the muscles that lie deep in the abdomen. 1. Lie on your back on a firm surface. Bend your knees and keep your feet flat on the floor. 2. Tense your abdominal muscles. Tip your pelvis up toward the ceiling and flatten your lower back  into the floor. ? To help with this exercise, you may place a small towel under your lower back and try to push your back into the towel. 3. Hold this position for __________ seconds. 4. Let your muscles relax completely before you repeat this exercise. Repeat __________ times. Complete this exercise __________ times a day. Alternating arm and leg raises 1. Get on your hands and knees on a firm surface. If you are on a hard floor, you may want to use padding, such as an exercise mat, to cushion your knees. 2. Line up your arms and legs. Your hands should be directly below your shoulders, and your knees should be directly below your hips. 3. Lift your left leg behind you. At the same time, raise your right arm and straighten it in front of you. ? Do not lift your leg higher than your hip. ? Do not lift your arm higher than your shoulder. ? Keep your abdominal and back muscles tight. ? Keep your hips facing the ground. ? Do not arch your back. ? Keep your balance carefully, and do not hold your breath. 4. Hold this position for __________ seconds. 5. Slowly return to the starting position. 6. Repeat with your right leg and your left arm. Repeat __________ times. Complete this exercise __________ times a day.   Abdominal set with straight leg raise 1. Lie on your back on a firm surface. 2. Bend one of your knees and keep your other leg straight. 3. Tense your abdominal muscles and lift your straight leg up, 4-6 inches (10-15 cm) off the ground. 4. Keep your abdominal muscles tight and hold this position for __________ seconds. ? Do not hold your breath. ? Do not arch your back. Keep it flat against the ground. 5. Keep your abdominal muscles tense as you slowly lower your leg back to the starting position. 6. Repeat with your other leg. Repeat __________ times. Complete this exercise __________ times a day.   Single leg lower with bent knees 1. Lie on your back on a firm surface. 2. Tense  your abdominal muscles and lift your feet off the floor, one foot at a time, so your knees and hips are bent in 90-degree angles (right angles). ? Your knees should be over your hips and your lower legs should be parallel to the floor. 3. Keeping your abdominal muscles tense and your knee bent, slowly lower one of your legs so your toe touches the  ground. 4. Lift your leg back up to return to the starting position. ? Do not hold your breath. ? Do not let your back arch. Keep your back flat against the ground. 5. Repeat with your other leg. Repeat __________ times. Complete this exercise __________ times a day. Posture and body mechanics Good posture and healthy body mechanics can help to relieve stress in your body's tissues and joints. Body mechanics refers to the movements and positions of your body while you do your daily activities. Posture is part of body mechanics. Good posture means:  Your spine is in its natural S-curve position (neutral).  Your shoulders are pulled back slightly.  Your head is not tipped forward. Follow these guidelines to improve your posture and body mechanics in your everyday activities. Standing  When standing, keep your spine neutral and your feet about hip width apart. Keep a slight bend in your knees. Your ears, shoulders, and hips should line up.  When you do a task in which you stand in one place for a long time, place one foot up on a stable object that is 2-4 inches (5-10 cm) high, such as a footstool. This helps keep your spine neutral.   Sitting  When sitting, keep your spine neutral and keep your feet flat on the floor. Use a footrest, if necessary, and keep your thighs parallel to the floor. Avoid rounding your shoulders, and avoid tilting your head forward.  When working at a desk or a computer, keep your desk at a height where your hands are slightly lower than your elbows. Slide your chair under your desk so you are close enough to maintain good  posture.  When working at a computer, place your monitor at a height where you are looking straight ahead and you do not have to tilt your head forward or downward to look at the screen.   Resting  When lying down and resting, avoid positions that are most painful for you.  If you have pain with activities such as sitting, bending, stooping, or squatting, lie in a position in which your body does not bend very much. For example, avoid curling up on your side with your arms and knees near your chest (fetal position).  If you have pain with activities such as standing for a long time or reaching with your arms, lie with your spine in a neutral position and bend your knees slightly. Try the following positions: ? Lying on your side with a pillow between your knees. ? Lying on your back with a pillow under your knees. Lifting  When lifting objects, keep your feet at least shoulder width apart and tighten your abdominal muscles.  Bend your knees and hips and keep your spine neutral. It is important to lift using the strength of your legs, not your back. Do not lock your knees straight out.  Always ask for help to lift heavy or awkward objects.   This information is not intended to replace advice given to you by your health care provider. Make sure you discuss any questions you have with your health care provider. Document Revised: 08/02/2018 Document Reviewed: 05/02/2018 Elsevier Patient Education  [employer].

## 2020-07-23 NOTE — Progress Notes (Signed)
Subjective:    Patient ID: Alyssa Clay, female    DOB: 10-10-1965, 55 y.o.   MRN: [id number]  Chief Complaint  Patient presents with  . Follow-up    HPI Patient was seen today for follow-up.  Patient notes continued popping, pressure of right side of face and behind eyes s/p amoxicillin on 3/11 and Augmentin 3/24.  AOM with spontaneous rupture noted during visit on 3/24.  Taking Mucinex D and claritin which makes her drowsy.  Pt mentions a clicking in R ear.  Started wearing her mouth guard again.  Had mouth guard x 3 yrs for h/o migraines.  Pt with R low back pain x 3 days after bending to pick up puppy out of a bin.  Feels back spasm and notes pain with certain movements.  Pt taking Mobic prn for R shoulder pain.  Past Medical History:  Diagnosis Date  . Asthma   . Depression   . 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 FUO (fever of unknown origin) 10/21/2019  . GERD (gastroesophageal reflux disease)   . Hypertension   . Migraine     No Known Allergies  ROS General: Denies fever, chills, night sweats, changes in weight, changes in appetite HEENT: Denies headaches, ear pain, changes in vision, rhinorrhea, sore throat +R ear/facial pain/pressure, R AOM CV: Denies CP, palpitations, SOB, orthopnea Pulm: Denies SOB, cough, wheezing GI: Denies abdominal pain, nausea, vomiting, diarrhea, constipation GU: Denies dysuria, hematuria, frequency, vaginal discharge Msk: Denies muscle cramps, joint pains +back pain Neuro: Denies weakness, numbness, tingling  Skin: Denies rashes, bruising Psych: Denies depression, anxiety, hallucinations     Objective:    Blood pressure 140/78, pulse 96, temperature 98.4 F (36.9 C), temperature source Oral, weight 255 lb 12.8 oz (116 kg), SpO2 94 %.  Gen. Pleasant, well-nourished, in no distress, normal affect  HEENT: /AT, face symmetric, conjunctiva clear, no scleral icterus, PERRLA, EOMI, nares patent without drainage,  pharynx without erythema or exudate.L TM full.  R TM full with mild erythema and possible  perforation, no drainage in canal. Lungs: no accessory muscle use, CTAB, no wheezes or rales Cardiovascular: RRR, no m/r/g, no peripheral edema Musculoskeletal: TTP of R maxillary and frontal sinuses, R TMJ and R lateral neck.  TTP of R lumbar paraspinal muscles.  No TTP of cervical, thoracic, or lumbar spine.  No deformities, no cyanosis or clubbing, normal tone Neuro:  A&Ox3, CN II-XII intact, normal gait Skin:  Warm, no lesions/ rash   Wt Readings from Last 3 Encounters:  07/23/20 255 lb 12.8 oz (116 kg)  07/15/20 253 lb 3.2 oz (114.9 kg)  06/14/20 255 lb 3.2 oz (115.8 kg)    Lab Results  Component Value Date   WBC 6.2 02/25/2020   HGB 13.7 02/25/2020   HCT 41.6 02/25/2020   PLT 344 02/25/2020   GLUCOSE 87 02/25/2020   CHOL 256 (H) 02/25/2020   TRIG 150 (H) 02/25/2020   HDL 51 02/25/2020   LDLCALC 176 (H) 02/25/2020   ALT 19 02/25/2020   AST 18 02/25/2020   NA 138 02/25/2020   K 4.1 02/25/2020   CL 100 02/25/2020   CREATININE 1.09 (H) 02/25/2020   BUN 11 02/25/2020   CO2 29 02/25/2020   TSH 1.98 02/25/2020   HGBA1C 5.7 08/19/2019    Assessment/Plan:  TMJ (temporomandibular joint syndrome) -supportive care including muscle relaxer prn.  Pt has some at home. -continue mouth guard  -follow up with  dentist for new mouth guard  Strain of lumbar paraspinous muscle, initial encounter -Discussed likely duration of symptoms  Acute right-sided low back pain without sciatica -Supportive care including heat, stretching, massage, rest, NSAIDs -Okay to use Mobic and tizanidine as needed  Eustachian tube dysfunction, right -We will discontinue Mucinex D -Continue Flonase and saline nasal rinse -Consider taking half tab of Claritin 2/2 drowsiness  Acute suppurative otitis media of right ear with spontaneous rupture of tympanic membrane, recurrence not specified -Complete  antibiotics -Continue supportive care including Flonase, saline nasal rinse, OTC antihistamine  Acute maxillary sinusitis, recurrence not specified -Complete antibiotics -Continue supportive care -Consider ENT referral for continued or worsened symptoms  F/u prn with PCP  Grier Mitts, MD

## 2020-07-26 ENCOUNTER — Other Ambulatory Visit: Payer: Self-pay | Admitting: Family Medicine

## 2020-07-28 NOTE — Progress Notes (Signed)
I, Alyssa Clay, LAT, ATC, am serving as scribe for Dr. Lynne Clay.  Alyssa Clay is a 55 y.o. female who presents to [facility] at [facility] today for f/u of R shoulder pain.  She was last seen by Dr. Georgina Clay on 06/08/20 w/ unchanged pain and had a R subacromial injection.  Since her last visit, pt reports she is done w/ PT. Pt reports not being able to go to the gym due to illnesses. Pt wondering if she can have a steroid injection so can "ease back into the gym."  Overall she is feeling pretty well but notes only minimal pain.  Diagnostic testing: R shoulder MRI- 06/05/20; R shoulder XR- 05/07/20  Pertinent review of systems: No fevers or chills  Relevant historical information: Hypertension   Exam:  BP 140/86 (BP Location: Right Arm, Patient Position: Sitting, Cuff Size: Normal)   Pulse 92   Ht 5' 5.5" (1.664 m)   Wt 255 lb 12.8 oz (116 kg)   SpO2 97%   BMI 41.92 kg/m   Wt Readings from Last 5 Encounters:  07/29/20 255 lb 12.8 oz (116 kg)  07/23/20 255 lb 12.8 oz (116 kg)  07/15/20 253 lb 3.2 oz (114.9 kg)  06/14/20 255 lb 3.2 oz (115.8 kg)  06/08/20 259 lb (117.5 kg)    General: Well Developed, well nourished, and in no acute distress.   MSK: Right shoulder normal-appearing nontender normal motion.  Intact strength.  Minimally positive Hawkins and Neer's test.    Lab and Radiology Results  : MRI OF THE RIGHT SHOULDER WITHOUT CONTRAST  TECHNIQUE: Multiplanar, multisequence MR imaging of the shoulder was performed. No intravenous contrast was administered.  COMPARISON:  None.  FINDINGS: Rotator cuff: Moderate rotator cuff tendinopathy/tendinosis most significantly involving the supraspinatus tendon with oblique coursing interstitial tears. No full-thickness retracted tear is identified.  Muscles:  Unremarkable.  Biceps long head:  Intact  Acromioclavicular Joint: No significant degenerative changes. Type 2 acromion. No lateral  downsloping or subacromial spurring.  Glenohumeral Joint: Normal articular cartilage. No joint effusion. Mild thickening of the capsular structures in the axillary recess could suggest synovitis or adhesive capsulitis.  Labrum:  No definite labral tears.  Bones:  No acute bony findings.  Other: Mild/moderate subacromial/subdeltoid bursitis.  IMPRESSION: 1. Moderate rotator cuff tendinopathy/tendinosis most significantly involving the supraspinatus tendon with oblique coursing interstitial tears. No full-thickness retracted tear. 2. Intact long head biceps tendon and glenoid labrum. 3. No significant findings for bony impingement. 4. Mild/moderate subacromial/subdeltoid bursitis.   Electronically Signed   By: Marijo Sanes M.D.   On: 06/06/2020 13:47  I, Alyssa Clay, personally (independently) visualized and performed the interpretation of the images attached in this note.     Assessment and Plan: 55 y.o. female with right shoulder pain predominantly due to rotator cuff tendinopathy and subacromial bursitis.  Possible component of frozen shoulder based on MRI however excellent range of motion makes frozen shoulder extremely unlikely.  Regardless [name] is doing quite well.  At this point I do not think there is a need for a repeat steroid injection.  Additionally it has only been about 6 weeks since her last injection and is effectively too soon for a repeat steroid injection.  The soonest we theoretically should do another steroid injection would be May 15.  She is happy with this plan of proceeding to return to gym and doing some weightlifting while being careful.  Reviewed weightlifting protocol with shoulder pain and she is  okay with this.  Plan to check back as needed.    Discussed warning signs or symptoms. Please see discharge instructions. Patient expresses understanding.   The above documentation has been reviewed and is accurate and complete Alyssa Clay,  M.D.  Total encounter time 20 minutes including face-to-face time with the patient and, reviewing past medical record, and charting on the date of service.   Treatment plan and options

## 2020-07-29 ENCOUNTER — Ambulatory Visit (INDEPENDENT_AMBULATORY_CARE_PROVIDER_SITE_OTHER): Payer: 59 | Admitting: Family Medicine

## 2020-07-29 ENCOUNTER — Ambulatory Visit: Payer: Self-pay

## 2020-07-29 ENCOUNTER — Other Ambulatory Visit: Payer: Self-pay

## 2020-07-29 VITALS — BP 140/86 | HR 92 | Ht 65.5 in | Wt 255.8 lb

## 2020-07-29 DIAGNOSIS — M25511 Pain in right shoulder: Secondary | ICD-10-CM

## 2020-07-29 NOTE — Patient Instructions (Addendum)
Thank you for coming in today.  Continue home exercises.   Ok to resume weights.   Listen to your shoulder and cheat a little.   Keep your hand in your peripheral vision with lifting.   If you need another shot we can do it on May 15th or later.

## 2020-08-06 ENCOUNTER — Emergency Department (HOSPITAL_COMMUNITY)
Admission: EM | Admit: 2020-08-06 | Discharge: 2020-08-06 | Disposition: A | Discharge status: Home / Self Care | Payer: 59 | Attending: Emergency Medicine | Admitting: Emergency Medicine

## 2020-08-06 NOTE — ED Notes (Signed)
No answer for triage.

## 2020-08-07 ENCOUNTER — Encounter (HOSPITAL_COMMUNITY): Payer: Self-pay | Admitting: *Deleted

## 2020-08-07 ENCOUNTER — Ambulatory Visit (HOSPITAL_COMMUNITY)
Admission: EM | Admit: 2020-08-07 | Discharge: 2020-08-07 | Disposition: A | Discharge status: Home / Self Care | Payer: 59

## 2020-08-07 ENCOUNTER — Emergency Department (HOSPITAL_BASED_OUTPATIENT_CLINIC_OR_DEPARTMENT_OTHER)
Admission: EM | Admit: 2020-08-07 | Discharge: 2020-08-07 | Disposition: A | Discharge status: Home / Self Care | Payer: 59 | Attending: Emergency Medicine | Admitting: Emergency Medicine

## 2020-08-07 ENCOUNTER — Emergency Department (HOSPITAL_BASED_OUTPATIENT_CLINIC_OR_DEPARTMENT_OTHER): Payer: 59

## 2020-08-07 ENCOUNTER — Encounter (HOSPITAL_BASED_OUTPATIENT_CLINIC_OR_DEPARTMENT_OTHER): Payer: Self-pay | Admitting: *Deleted

## 2020-08-07 ENCOUNTER — Other Ambulatory Visit: Payer: Self-pay

## 2020-08-07 DIAGNOSIS — R1084 Generalized abdominal pain: Secondary | ICD-10-CM | POA: Diagnosis not present

## 2020-08-07 DIAGNOSIS — I10 Essential (primary) hypertension: Secondary | ICD-10-CM | POA: Insufficient documentation

## 2020-08-07 DIAGNOSIS — J45909 Unspecified asthma, uncomplicated: Secondary | ICD-10-CM | POA: Diagnosis not present

## 2020-08-07 DIAGNOSIS — K922 Gastrointestinal hemorrhage, unspecified: Secondary | ICD-10-CM

## 2020-08-07 DIAGNOSIS — K219 Gastro-esophageal reflux disease without esophagitis: Secondary | ICD-10-CM | POA: Diagnosis not present

## 2020-08-07 DIAGNOSIS — Z7952 Long term (current) use of systemic steroids: Secondary | ICD-10-CM | POA: Diagnosis not present

## 2020-08-07 DIAGNOSIS — R109 Unspecified abdominal pain: Secondary | ICD-10-CM | POA: Diagnosis present

## 2020-08-07 DIAGNOSIS — K5900 Constipation, unspecified: Secondary | ICD-10-CM

## 2020-08-07 LAB — CBC WITH DIFFERENTIAL/PLATELET
Abs Immature Granulocytes: 0.02 10*3/uL (ref 0.00–0.07)
Basophils Absolute: 0.1 10*3/uL (ref 0.0–0.1)
Basophils Relative: 1 %
Eosinophils Absolute: 0 10*3/uL (ref 0.0–0.5)
Eosinophils Relative: 0 %
HCT: 43.4 % (ref 36.0–46.0)
Hemoglobin: 14.3 g/dL (ref 12.0–15.0)
Immature Granulocytes: 0 %
Lymphocytes Relative: 27 %
Lymphs Abs: 2.2 10*3/uL (ref 0.7–4.0)
MCH: 29.7 pg (ref 26.0–34.0)
MCHC: 32.9 g/dL (ref 30.0–36.0)
MCV: 90.2 fL (ref 80.0–100.0)
Monocytes Absolute: 0.6 10*3/uL (ref 0.1–1.0)
Monocytes Relative: 8 %
Neutro Abs: 5.3 10*3/uL (ref 1.7–7.7)
Neutrophils Relative %: 64 %
Platelets: 305 10*3/uL (ref 150–400)
RBC: 4.81 MIL/uL (ref 3.87–5.11)
RDW: 13.2 % (ref 11.5–15.5)
WBC: 8.2 10*3/uL (ref 4.0–10.5)
nRBC: 0 % (ref 0.0–0.2)

## 2020-08-07 LAB — URINALYSIS, ROUTINE W REFLEX MICROSCOPIC
Bilirubin Urine: NEGATIVE
Glucose, UA: NEGATIVE mg/dL
Hgb urine dipstick: NEGATIVE
Ketones, ur: NEGATIVE mg/dL
Leukocytes,Ua: NEGATIVE
Nitrite: NEGATIVE
Specific Gravity, Urine: 1.022 (ref 1.005–1.030)
pH: 5.5 (ref 5.0–8.0)

## 2020-08-07 LAB — COMPREHENSIVE METABOLIC PANEL
ALT: 15 U/L (ref 0–44)
AST: 14 U/L — ABNORMAL LOW (ref 15–41)
Albumin: 4.4 g/dL (ref 3.5–5.0)
Alkaline Phosphatase: 66 U/L (ref 38–126)
Anion gap: 8 (ref 5–15)
BUN: 11 mg/dL (ref 6–20)
CO2: 29 mmol/L (ref 22–32)
Calcium: 9.5 mg/dL (ref 8.9–10.3)
Chloride: 102 mmol/L (ref 98–111)
Creatinine, Ser: 1.02 mg/dL — ABNORMAL HIGH (ref 0.44–1.00)
GFR, Estimated: >60 mL/min (ref >60)
Glucose, Bld: 92 mg/dL (ref 70–99)
Potassium: 3.4 mmol/L — ABNORMAL LOW (ref 3.5–5.1)
Sodium: 139 mmol/L (ref 135–145)
Total Bilirubin: 0.5 mg/dL (ref 0.3–1.2)
Total Protein: 7.4 g/dL (ref 6.5–8.1)

## 2020-08-07 LAB — LIPASE, BLOOD: Lipase: 17 U/L (ref 11–51)

## 2020-08-07 IMAGING — CT CT ABD-PELV W/ CM
2 of 6 series · 16 of 46 positions shown, 18 images · IV contrast (APPLIED)
Comparison: CT abdomen and pelvis on [DATE]

CLINICAL DATA: Abdominal pain, acute and nonlocalized. Prior
appendectomy.

EXAM:
CT ABDOMEN AND PELVIS WITH CONTRAST
TECHNIQUE: Multidetector CT imaging of the abdomen and pelvis was performed
using the standard protocol following bolus administration of
intravenous contrast.
CONTRAST:  100mL OMNIPAQUE IOHEXOL 300 MG/ML  SOLN

[Series 2: abd pel w · axial · 0.85mm/px · z∈[+724,+1159]mm · 13 of 103 slices shown, 15 images]
[im 8/103  soft-tissue]
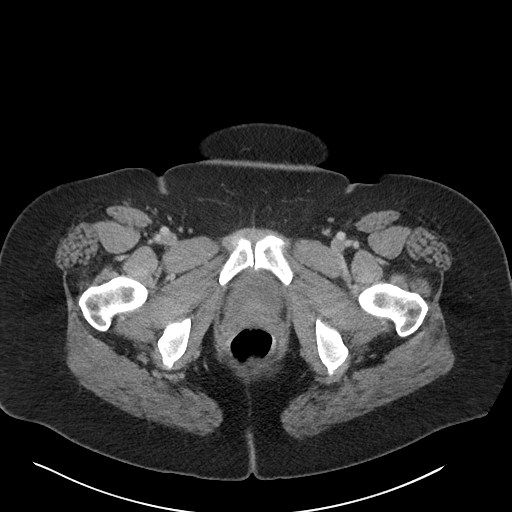
[im 8/103  bone]
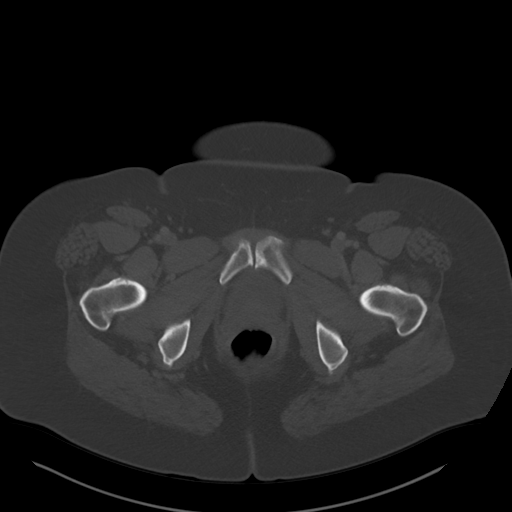
[im 15/103  soft-tissue]
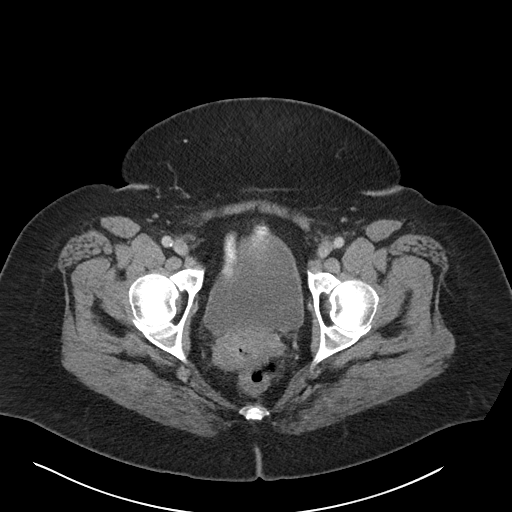
[im 22/103  soft-tissue]
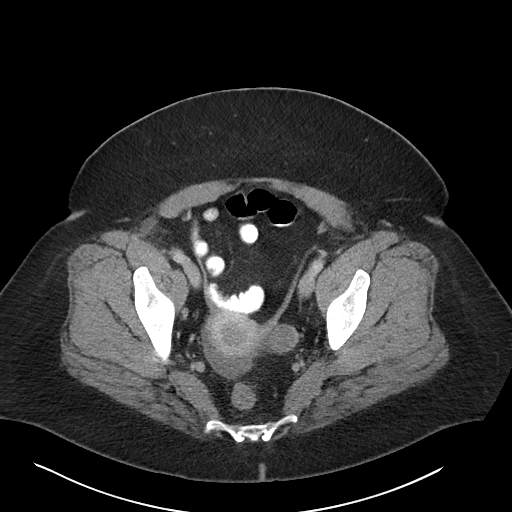
[im 30/103  soft-tissue]
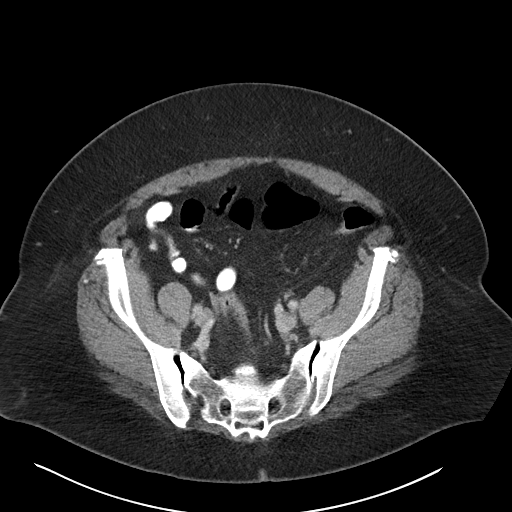
[im 37/103  soft-tissue]
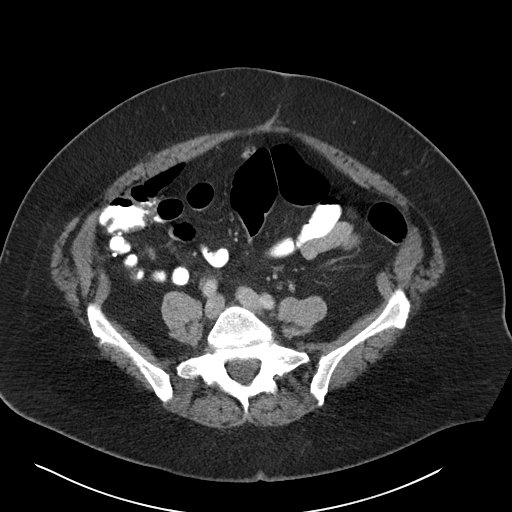
[im 44/103  soft-tissue]
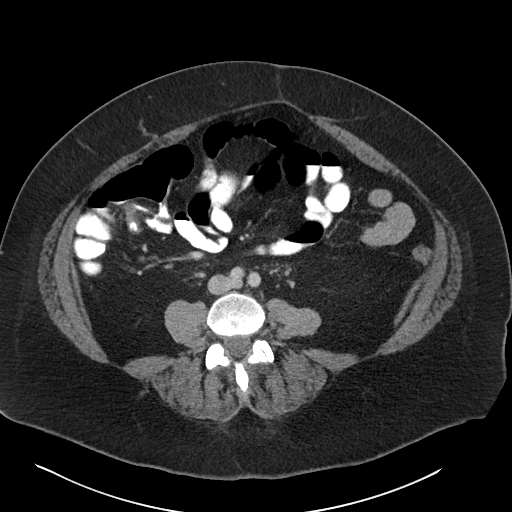
[im 52/103  soft-tissue]
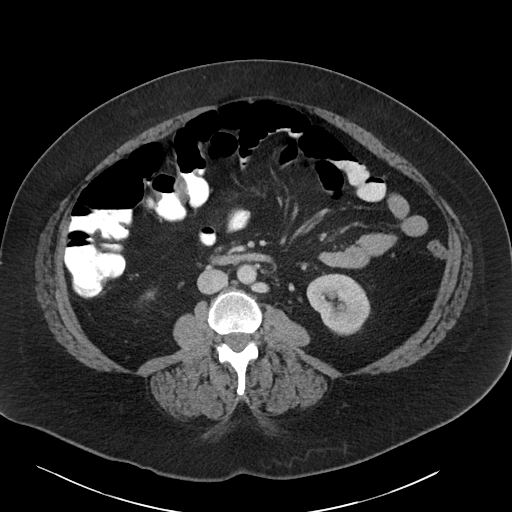
[im 59/103  soft-tissue]
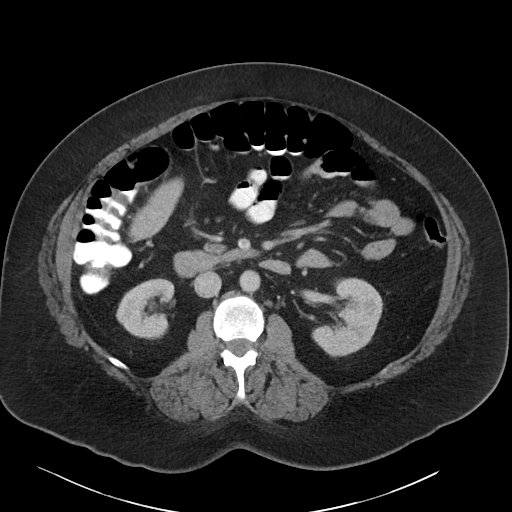
[im 66/103  soft-tissue]
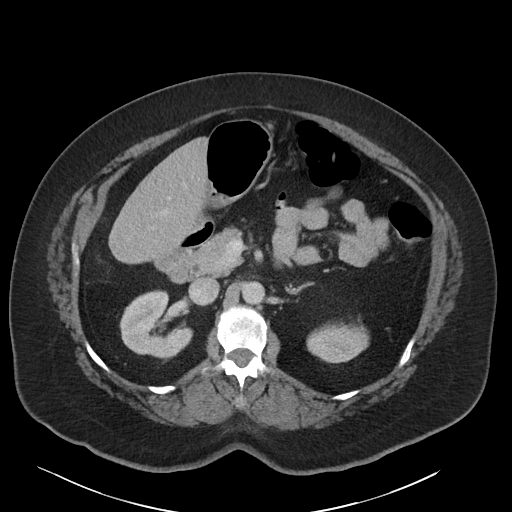
[im 66/103  bone]
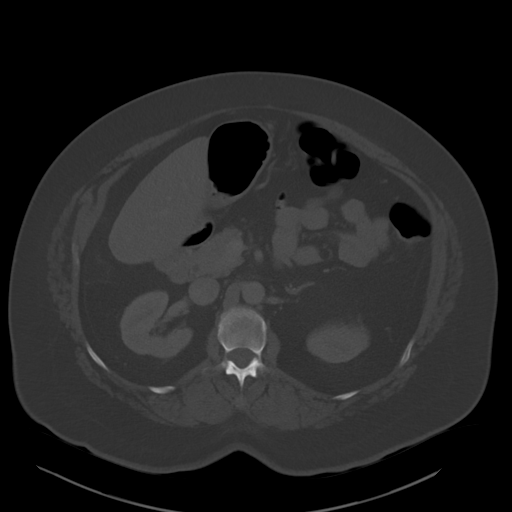
[im 73/103  soft-tissue]
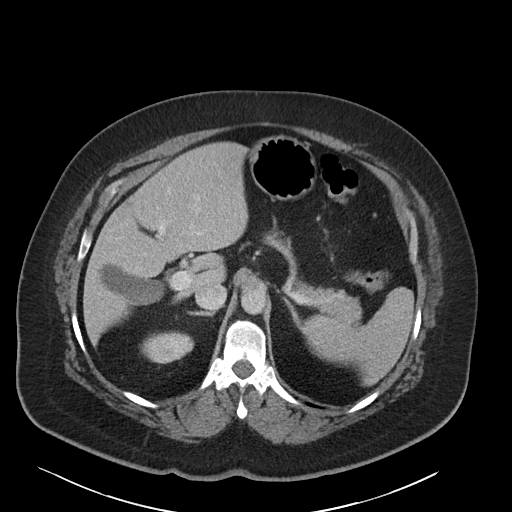
[im 81/103  soft-tissue]
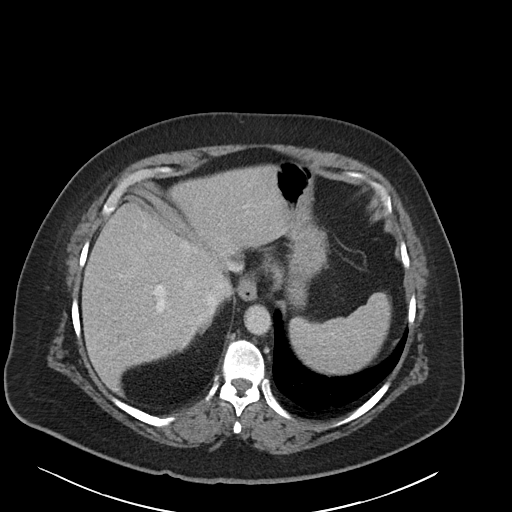
[im 88/103  soft-tissue]
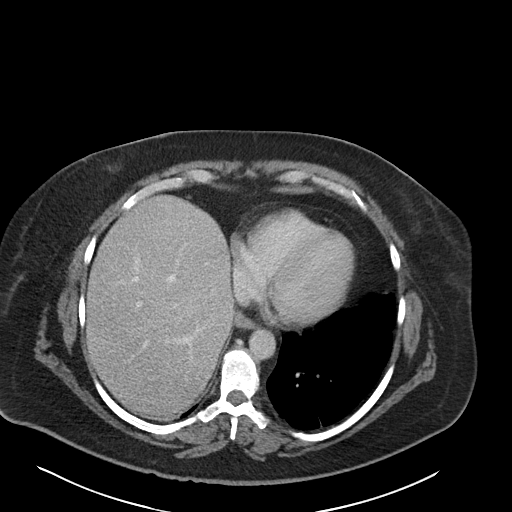
[im 95/103  soft-tissue]
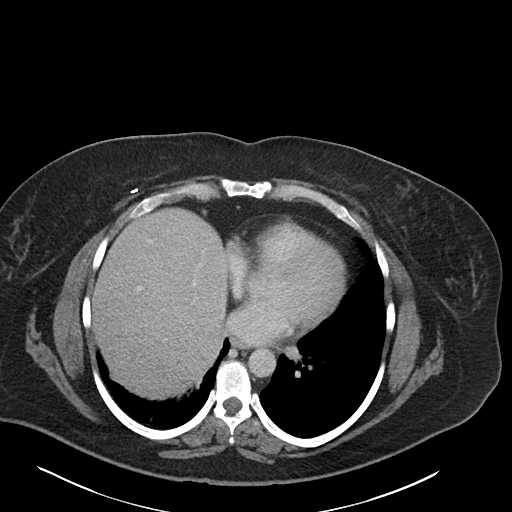

[Series 5: coronal · coronal · 0.93mm/px · 3 of 122 slices shown]
[im 41/122  soft-tissue]
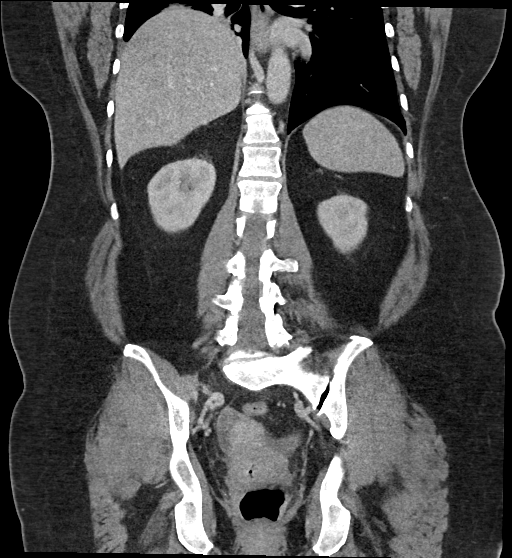
[im 54/122  soft-tissue]
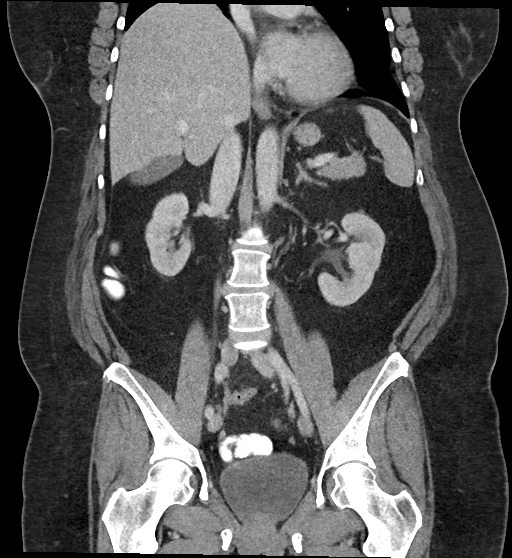
[im 68/122  soft-tissue]
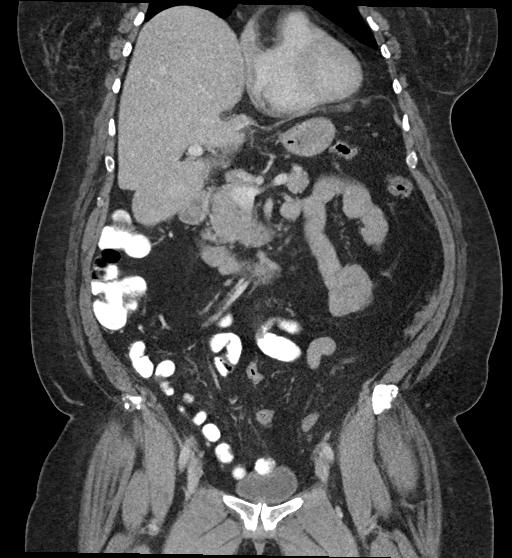

[16 of 46 positions shown; findings below may reference images not displayed]

FINDINGS: Lower chest: Lung bases are unremarkable. Stable elevation of the
RIGHT hemidiaphragm.

Hepatobiliary: No focal liver abnormality is seen. No radiopaque
gallstones, biliary dilatation, or pericholecystic inflammatory
changes.

Pancreas: Unremarkable. No pancreatic ductal dilatation or
surrounding inflammatory changes.

Spleen: Normal in size without focal abnormality.

Adrenals/Urinary Tract: Adrenal glands are normal in appearance.
Subcentimeter cysts are identified within the RIGHT kidney. No
suspicious renal mass. No hydronephrosis. The ureters are
unremarkable.

The bladder and visualized portion of the urethra are normal.

Stomach/Bowel: Stomach is normal in appearance. Small bowel loops
are unremarkable. Colon is normal in appearance. Status post
appendectomy.

Vascular/Lymphatic: There is atherosclerotic calcification of the
abdominal aorta. No associated aneurysm. No retroperitoneal or
mesenteric adenopathy.

Reproductive: The uterus is present. Intrauterine device has been
removed. The LEFT ovary is unremarkable. The RIGHT ovary is likely
posterior to the uterus and mildly enlarged.

Other: No ascites.  Anterior abdominal wall is unremarkable.

Musculoskeletal: No acute or significant osseous findings.
IMPRESSION: 1. No evidence for acute abnormality of the abdomen or pelvis.
2. Status post appendectomy.
3. Intrauterine device has been removed. Mildly enlarged RIGHT
ovary. Recommend further evaluation with pelvic ultrasound.
4. Aortic Atherosclerosis ([FO]-[FO]).

## 2020-08-07 MED ORDER — KETOROLAC TROMETHAMINE 15 MG/ML IJ SOLN
15.0000 mg | Freq: Once | INTRAMUSCULAR | Status: AC
  Administered 2020-08-07: 15 mg via INTRAVENOUS
  Filled 2020-08-07: qty 1

## 2020-08-07 MED ORDER — MAGNESIUM CITRATE PO SOLN
1.0000 | Freq: Once | ORAL | Status: AC
  Administered 2020-08-07: 1 via ORAL
  Filled 2020-08-07: qty 296

## 2020-08-07 MED ORDER — IOHEXOL 300 MG/ML  SOLN
100.0000 mL | Freq: Once | INTRAMUSCULAR | Status: AC | PRN
  Administered 2020-08-07: 100 mL via INTRAVENOUS

## 2020-08-07 NOTE — ED Notes (Signed)
2 attempts to start IV - unsuccessful  Charge RN Tim advised

## 2020-08-07 NOTE — ED Triage Notes (Signed)
Pt reports constipation  After taking Migraine . Pt reports sitting in BR pushing with blood in in water not stool.

## 2020-08-07 NOTE — ED Triage Notes (Addendum)
Pt presents with constipation, has tried laxatives without success. Able to have hard stool with pain, bright red bleeding noted, then to loose stool with blood, after 3 am, not as much blood noted. Abd cramps and nausea. Headache, chills and generalized weakness reported all day yesterday. Pt went to ER yesterday, then UC this morning.

## 2020-08-07 NOTE — ED Notes (Signed)
Pt c/o constipation - took laxatives- one hard stool and blood noted  Second BM soft and blood

## 2020-08-07 NOTE — Discharge Instructions (Addendum)
Call your gastroenterologist as discussed in the next 2-3 days.  Drink increased amounts of water daily.  Increase your MiraLAX to 3 times a day breakfast lunch and dinner for the next 3 days.  Return immediately back to the ER if:  Your symptoms worsen within the next 12-24 hours. You develop new symptoms such as new fevers, persistent vomiting, new pain, shortness of breath, or new weakness or numbness, or if you have any other concerns.

## 2020-08-07 NOTE — ED Notes (Signed)
Pt back from CT

## 2020-08-07 NOTE — ED Provider Notes (Signed)
[facility]    CSN: [id number] Arrival date & time: 08/07/20  1001      History   Chief Complaint Chief Complaint  Patient presents with  . Constipation  . Headache  . Generalized Body Aches    HPI Alyssa Clay is a 55 y.o. female.   HPI  Constipation: Pt reports that for the past week she has been having migraines.  She reports that this is not abnormal for her.  She took a muscle relaxer for her symptoms which she has done many times however she developed severe constipation which is abnormal.  She states that she was not able to have a bowel movement for 3 days.  She started having abdominal pains and so she tried to sit on the toilet to have a bowel movement.  She states that when she tried to have a bowel movement there was only blood in the toilet and describes the blood about as large.  She reports that later that day she attempted again and was able to pass a very firm movement with additional blood.  The movement was black and bloody.  She also but has been having nausea without any vomiting.  Overall she feels weak.  She reports that she went to the emergency room yesterday to seek care however the wait was over 5 hours long so she went home.  She has not had any active bleeding for the past 3 hours. She is not UTD on her colonoscopies but has one planned for next month.    Past Medical History:  Diagnosis Date  . Asthma   . Depression   . 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 FUO (fever of unknown origin) 10/21/2019  . GERD (gastroesophageal reflux disease)   . Hypertension   . Migraine     Patient Active Problem List   Diagnosis Date Noted  . OSA (obstructive sleep apnea) 12/18/2019  . FUO (fever of unknown origin) 10/21/2019  . S/P bilateral breast reduction 06/19/2019  . Back pain 04/15/2019  . Neck pain 04/15/2019  . History of frequent upper respiratory infection 01/16/2019  . Atelectasis 01/16/2019  . Severe  persistent asthma 01/16/2019  . Shortness of breath 01/01/2019  . Primary osteoarthritis of both knees 07/10/2018  . Hypertension 03/27/2018  . 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 Chronic rhinitis 01/07/2018  . Gastroesophageal reflux disease 01/07/2018    Past Surgical History:  Procedure Laterality Date  . BREAST REDUCTION SURGERY Bilateral 06/12/2019   Procedure: BREAST REDUCTION WITH LIPOSUCTION;  Surgeon: Wallace Going, DO;  Location: Home;  Service: Plastics;  Laterality: Bilateral;  4 hours, please  . CESAREAN SECTION  1994  . DILATION AND CURETTAGE OF UTERUS     multiple due to miscarriages  . KNEE ARTHROSCOPY Left 2013  . LAPAROSCOPIC APPENDECTOMY N/A 12/08/2018   Procedure: APPENDECTOMY LAPAROSCOPIC;  Surgeon: Donnie Mesa, MD;  Location: [place];  Service: General;  Laterality: N/A;  . TENNIS ELBOW RELEASE/NIRSCHEL PROCEDURE  2009  . WISDOM TOOTH EXTRACTION      OB History   No obstetric history on file.      Home Medications    Prior to Admission medications   Medication Sig Start Date End Date Taking? Authorizing Provider  albuterol (PROAIR HFA) 108 (90 Base) MCG/ACT inhaler Inhale 2 puffs into the lungs every 4 (four) hours as needed for wheezing or shortness of breath. 07/16/20  Garnet Sierras, DO  albuterol (PROVENTIL) (2.5 MG/3ML) 0.083% nebulizer solution Take 2.5 mg by nebulization every 6 (six) hours as needed for wheezing or shortness of breath.    [provider]  albuterol (VENTOLIN HFA) 108 (90 Base) MCG/ACT inhaler Inhale two puffs every 4-6 hours if needed for cough or wheeze 07/16/20   Garnet Sierras, DO  budesonide-formoterol (SYMBICORT) 160-4.5 MCG/ACT inhaler TAKE 2 PUFFS BY MOUTH TWICE A DAY 06/29/20   Garnet Sierras, DO  buPROPion (WELLBUTRIN XL) 150 MG 24 hr tablet Take 1 tablet (150 mg total) by mouth daily. 06/30/20   Caren Macadam, MD  calcium carbonate (OSCAL) 1500 (600 Ca) MG TABS  tablet Take 600 mg of elemental calcium by mouth 2 (two) times daily with a meal.    [provider]  cyclobenzaprine (FLEXERIL) 10 MG tablet Take 10 mg by mouth 3 (three) times daily as needed (migraines).    [provider]  DULoxetine (CYMBALTA) 60 MG capsule TAKE 1 CAPSULE BY MOUTH EVERY DAY 07/02/20   Koberlein, Steele Berg, MD  Erenumab-aooe (AIMOVIG) 140 MG/ML SOAJ INJECT 140 MG INTO THE SKIN EVERY 30 (THIRTY) DAYS. 03/01/20   [provider]  FASENRA 30 MG/ML SOSY SECOND SHIP: INJECT ONE SYRINGE UNDER THE SKIN AT WEEK 4 AND 8, THEN EVERY 8 WEEKS THEREAFTER. Patient taking differently: Inject 30 mg into the skin See admin instructions. Every 8 weeks 09/24/18   Garnet Sierras, DO  fluticasone [facility]) 50 MCG/ACT nasal spray Place 1 spray into both nostrils daily. Patient not taking: No sig reported 07/02/20   Billie Ruddy, MD  fluticasone (FLOVENT HFA) 110 MCG/ACT inhaler Inhale 2 puffs into the lungs 2 (two) times daily. During respiratory infections/asthma flares. 11/12/19   Garnet Sierras, DO  gabapentin (NEURONTIN) 300 MG capsule Take 300-900 mg by mouth See admin instructions. Take 1 tablet every AM, three tablets at bedtime 01/03/18   [provider]  hydrOXYzine (ATARAX/VISTARIL) 25 MG tablet Take 25 mg by mouth 3 (three) times daily as needed.    [provider]  meloxicam (MOBIC) 15 MG tablet TAKE 1 TABLET (15 MG TOTAL) BY MOUTH DAILY. 07/26/20   Laurey Morale, MD  montelukast (SINGULAIR) 10 MG tablet Take 1 tablet (10 mg total) by mouth daily. 06/29/20   Garnet Sierras, DO  Multiple Vitamin (MULTIVITAMIN WITH MINERALS) TABS tablet Take 1 tablet by mouth daily.    [provider]  nystatin cream (MYCOSTATIN) Apply 1 application topically 2 (two) times daily. Patient not taking: No sig reported 06/14/20   Billie Ruddy, MD  pantoprazole (PROTONIX) 40 MG tablet TAKE 1 TABLET BY MOUTH 2 TIMES DAILY BEFORE A MEAL. 07/02/20   Caren Macadam, MD   Respiratory Therapy Supplies (FLUTTER) DEVI Use 3 times daily as directed 01/22/19   Mannam, Praveen, MD  Rimegepant Sulfate (NURTEC) 75 MG TBDP Take by mouth.    [provider]  tiZANidine (ZANAFLEX) 4 MG tablet Take 4 mg by mouth every 6 (six) hours as needed for muscle spasms.    [provider]  traZODone (DESYREL) 100 MG tablet Take 1-2 tablets (100-200 mg total) by mouth at bedtime as needed for sleep. 07/02/20   Caren Macadam, MD  valACYclovir (VALTREX) 1000 MG tablet Take 1 tablet (1,000 mg total) by mouth 2 (two) times daily. 07/15/20   Caren Macadam, MD    Family History Family History  Problem Relation Age of Onset  .  Depression Mother   . Lymphoma Mother 52  . Hypertension Father   . Heart attack Father 57  . Hypertension Brother   . High blood pressure Brother   . High Cholesterol Brother   . Healthy Daughter   . Colon cancer Maternal Aunt 34  . Esophageal cancer Neg Hx   . Pancreatic cancer Neg Hx   . Stomach cancer Neg Hx     Social History Social History   Tobacco Use  . Smoking status: Never Smoker  . Smokeless tobacco: Never Used  Vaping Use  . Vaping Use: Never used  Substance Use Topics  . Alcohol use: Yes    Comment: occ  . Drug use: Never     Allergies   Patient has no known allergies.   Review of Systems Review of Systems  As stated above in HPI Physical Exam Triage Vital Signs ED Triage Vitals  Enc Vitals Group     BP 08/07/20 1015 138/71     Pulse Rate 08/07/20 1015 93     Resp 08/07/20 1015 20     Temp 08/07/20 1015 98.6 F (37 C)     Temp Source 08/07/20 1015 Oral     SpO2 08/07/20 1015 95 %     Weight --      Height --      Head Circumference --      Peak Flow --      Pain Score 08/07/20 1017 8     Pain Loc --      Pain Edu? --      Excl. in [place]? --    No data found.  Updated Vital Signs BP 138/71 (BP Location: Left Arm)   Pulse 93   Temp 98.6 F (37 C) (Oral)   Resp 20   SpO2 95%    Physical Exam Vitals and nursing note reviewed.  Constitutional:      General: She is not in acute distress.    Appearance: She is well-developed. She is ill-appearing. She is not toxic-appearing or diaphoretic.  HENT:     Head: Normocephalic and atraumatic.     Mouth/Throat:     Mouth: Mucous membranes are moist.  Eyes:     Comments: NO evidence of pallor  Cardiovascular:     Rate and Rhythm: Normal rate and regular rhythm.     Heart sounds: Normal heart sounds.  Pulmonary:     Effort: Pulmonary effort is normal.     Breath sounds: Normal breath sounds.  Abdominal:     Tenderness: There is abdominal tenderness (throughout). There is guarding (throughout).     Comments: Rushing slightly hypoactive bowel sounds  Lymphadenopathy:     Cervical: No cervical adenopathy.  Skin:    General: Skin is warm.     Coloration: Skin is not pale.  Neurological:     Mental Status: She is alert and oriented to person, place, and time.     Cranial Nerves: No cranial nerve deficit or facial asymmetry.     Sensory: No sensory deficit.     Gait: Gait normal.      UC Treatments / Results  Labs (all labs ordered are listed, but only abnormal results are displayed) Labs Reviewed - No data to display  EKG   Radiology No results found.  Procedures Procedures (including critical care time)  Medications Ordered in UC Medications - No data to display  Initial Impression / Assessment and Plan / UC Course  I have reviewed the  triage vital signs and the nursing notes.  Pertinent labs & imaging results that were available during my care of the patient were reviewed by me and considered in my medical decision making (see chart for details).     New. Concerning for GI bleed vs blockage. I have discussed this with patient. I have recommended further evaluation in the hospital as she likely will need a CT scan along with further work-up including serial blood work.  She is agreeable.  I have  recommended the med center at [place] as this tends to have a lesser wait time. Her husband prefers to provide private vehicle transfer which I am agreeable with since her vitals are normal and she does not have evidence of pallor.  Final Clinical Impressions(s) / UC Diagnoses   Final diagnoses:  None   Discharge Instructions   None    ED Prescriptions    None     PDMP not reviewed this encounter.   Hughie Closs, [place] 08/07/20 1041

## 2020-08-07 NOTE — ED Notes (Signed)
Last BM with blood was 3am

## 2020-08-07 NOTE — ED Notes (Signed)
Urine sent

## 2020-08-07 NOTE — ED Provider Notes (Signed)
Alyssa Clay Provider Note   CSN: [id number] Arrival date & time: 08/07/20  1105     History Chief Complaint  Patient presents with  . Constipation    Alyssa Clay Alyssa Clay is a 55 y.o. female.  Patient presents with abdominal pain constipation.  She has been constipated for about 3 days.  She did have a bowel movement yesterday but describes it as a hard stool and noticed some blood with her bowel movement yesterday.  She otherwise denies any fevers or cough.  Denies vomiting.  Complaining of generalized abdominal pain.  She has tried 1 cup of MiraLAX at home without improvement.        Past Medical History:  Diagnosis Date  . Asthma   . Depression   . 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 FUO (fever of unknown origin) 10/21/2019  . GERD (gastroesophageal reflux disease)   . Hypertension   . Migraine     Patient Active Problem List   Diagnosis Date Noted  . OSA (obstructive sleep apnea) 12/18/2019  . FUO (fever of unknown origin) 10/21/2019  . S/P bilateral breast reduction 06/19/2019  . Back pain 04/15/2019  . Neck pain 04/15/2019  . History of frequent upper respiratory infection 01/16/2019  . Atelectasis 01/16/2019  . Severe persistent asthma 01/16/2019  . Shortness of breath 01/01/2019  . Primary osteoarthritis of both knees 07/10/2018  . Hypertension 03/27/2018  . 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 Chronic rhinitis 01/07/2018  . Gastroesophageal reflux disease 01/07/2018    Past Surgical History:  Procedure Laterality Date  . BREAST REDUCTION SURGERY Bilateral 06/12/2019   Procedure: BREAST REDUCTION WITH LIPOSUCTION;  Surgeon: Wallace Going, DO;  Location: [place];  Service: Plastics;  Laterality: Bilateral;  4 hours, please  . CESAREAN SECTION  1994  . DILATION AND CURETTAGE OF UTERUS     multiple due to miscarriages  . KNEE ARTHROSCOPY Left  2013  . LAPAROSCOPIC APPENDECTOMY N/A 12/08/2018   Procedure: APPENDECTOMY LAPAROSCOPIC;  Surgeon: Donnie Mesa, MD;  Location: [place];  Service: General;  Laterality: N/A;  . TENNIS ELBOW RELEASE/NIRSCHEL PROCEDURE  2009  . WISDOM TOOTH EXTRACTION       OB History   No obstetric history on file.     Family History  Problem Relation Age of Onset  . Depression Mother   . Lymphoma Mother 67  . Hypertension Father   . Heart attack Father 58  . Hypertension Brother   . High blood pressure Brother   . High Cholesterol Brother   . Healthy Daughter   . Colon cancer Maternal Aunt 25  . Esophageal cancer Neg Hx   . Pancreatic cancer Neg Hx   . Stomach cancer Neg Hx     Social History   Tobacco Use  . Smoking status: Never Smoker  . Smokeless tobacco: Never Used  Vaping Use  . Vaping Use: Never used  Substance Use Topics  . Alcohol use: Yes    Comment: occ  . Drug use: Never    Home Medications Prior to Admission medications   Medication Sig Start Date End Date Taking? Authorizing Provider  albuterol (PROAIR HFA) 108 (90 Base) MCG/ACT inhaler Inhale 2 puffs into the lungs every 4 (four) hours as needed for wheezing or shortness of breath. 07/16/20   Garnet Sierras, DO  albuterol (PROVENTIL) (2.5 MG/3ML) 0.083% nebulizer solution Take 2.5 mg by nebulization every  6 (six) hours as needed for wheezing or shortness of breath.    [provider]  albuterol (VENTOLIN HFA) 108 (90 Base) MCG/ACT inhaler Inhale two puffs every 4-6 hours if needed for cough or wheeze 07/16/20   Garnet Sierras, DO  budesonide-formoterol (SYMBICORT) 160-4.5 MCG/ACT inhaler TAKE 2 PUFFS BY MOUTH TWICE A DAY 06/29/20   Garnet Sierras, DO  buPROPion (WELLBUTRIN XL) 150 MG 24 hr tablet Take 1 tablet (150 mg total) by mouth daily. 06/30/20   Caren Macadam, MD  calcium carbonate (OSCAL) 1500 (600 Ca) MG TABS tablet Take 600 mg of elemental calcium by mouth 2 (two) times daily with a meal.    [provider]  cyclobenzaprine (FLEXERIL) 10 MG tablet Take 10 mg by mouth 3 (three) times daily as needed (migraines).    [provider]  DULoxetine (CYMBALTA) 60 MG capsule TAKE 1 CAPSULE BY MOUTH EVERY DAY 07/02/20   Koberlein, Steele Berg, MD  Erenumab-aooe (AIMOVIG) 140 MG/ML SOAJ INJECT 140 MG INTO THE SKIN EVERY 30 (THIRTY) DAYS. 03/01/20   [provider]  FASENRA 30 MG/ML SOSY SECOND SHIP: INJECT ONE SYRINGE UNDER THE SKIN AT WEEK 4 AND 8, THEN EVERY 8 WEEKS THEREAFTER. Patient taking differently: Inject 30 mg into the skin See admin instructions. Every 8 weeks 09/24/18   Garnet Sierras, DO  fluticasone [facility]) 50 MCG/ACT nasal spray Place 1 spray into both nostrils daily. Patient not taking: No sig reported 07/02/20   Billie Ruddy, MD  fluticasone (FLOVENT HFA) 110 MCG/ACT inhaler Inhale 2 puffs into the lungs 2 (two) times daily. During respiratory infections/asthma flares. 11/12/19   Garnet Sierras, DO  gabapentin (NEURONTIN) 300 MG capsule Take 300-900 mg by mouth See admin instructions. Take 1 tablet every AM, three tablets at bedtime 01/03/18   [provider]  hydrOXYzine (ATARAX/VISTARIL) 25 MG tablet Take 25 mg by mouth 3 (three) times daily as needed.    [provider]  meloxicam (MOBIC) 15 MG tablet TAKE 1 TABLET (15 MG TOTAL) BY MOUTH DAILY. 07/26/20   Laurey Morale, MD  montelukast (SINGULAIR) 10 MG tablet Take 1 tablet (10 mg total) by mouth daily. 06/29/20   Garnet Sierras, DO  Multiple Vitamin (MULTIVITAMIN WITH MINERALS) TABS tablet Take 1 tablet by mouth daily.    [provider]  nystatin cream (MYCOSTATIN) Apply 1 application topically 2 (two) times daily. Patient not taking: No sig reported 06/14/20   Billie Ruddy, MD  pantoprazole (PROTONIX) 40 MG tablet TAKE 1 TABLET BY MOUTH 2 TIMES DAILY BEFORE A MEAL. 07/02/20   Caren Macadam, MD  Respiratory Therapy Supplies (FLUTTER) DEVI Use 3 times daily as directed 01/22/19   Mannam,  Praveen, MD  Rimegepant Sulfate (NURTEC) 75 MG TBDP Take by mouth.    [provider]  tiZANidine (ZANAFLEX) 4 MG tablet Take 4 mg by mouth every 6 (six) hours as needed for muscle spasms.    [provider]  traZODone (DESYREL) 100 MG tablet Take 1-2 tablets (100-200 mg total) by mouth at bedtime as needed for sleep. 07/02/20   Caren Macadam, MD  valACYclovir (VALTREX) 1000 MG tablet Take 1 tablet (1,000 mg total) by mouth 2 (two) times daily. 07/15/20   Caren Macadam, MD    Allergies    Patient has no known allergies.  Review of Systems   Review of Systems  Constitutional: Negative for fever.  HENT: Negative for ear pain.  Eyes: Negative for pain.  Respiratory: Negative for cough.   Cardiovascular: Negative for chest pain.  Gastrointestinal: Positive for abdominal pain.  Genitourinary: Negative for flank pain.  Musculoskeletal: Negative for back pain.  Skin: Negative for rash.  Neurological: Negative for headaches.    Physical Exam Updated Vital Signs BP (!) 163/89   Pulse 81   Temp 98 F (36.7 C) (Oral)   Resp 18   Ht 5\' 5"  (1.651 m)   Wt 115.7 kg   SpO2 97%   BMI 42.43 kg/m   Physical Exam Constitutional:      General: She is not in acute distress.    Appearance: Normal appearance.  HENT:     Head: Normocephalic.     Nose: Nose normal.  Eyes:     Extraocular Movements: Extraocular movements intact.  Cardiovascular:     Rate and Rhythm: Normal rate.  Pulmonary:     Effort: Pulmonary effort is normal.  Abdominal:     Comments: Diffuse moderate tenderness.  No guarding or rebound.  Musculoskeletal:        General: Normal range of motion.     Cervical back: Normal range of motion.  Neurological:     General: No focal deficit present.     Mental Status: She is alert. Mental status is at baseline.     ED Results / Procedures / Treatments   Labs (all labs ordered are listed, but only abnormal results are displayed) Labs  Reviewed  COMPREHENSIVE METABOLIC PANEL - Abnormal; Notable for the following components:      Result Value   Potassium 3.4 (*)    Creatinine, Ser 1.02 (*)    AST 14 (*)    All other components within normal limits  URINALYSIS, ROUTINE W REFLEX MICROSCOPIC - Abnormal; Notable for the following components:   Protein, ur TRACE (*)    All other components within normal limits  CBC WITH DIFFERENTIAL/PLATELET  LIPASE, BLOOD    EKG None  Radiology CT Abdomen Pelvis W Contrast  Result Date: 08/07/2020 CLINICAL DATA:  Abdominal pain, acute and nonlocalized. Prior appendectomy. EXAM: CT ABDOMEN AND PELVIS WITH CONTRAST TECHNIQUE: Multidetector CT imaging of the abdomen and pelvis was performed using the standard protocol following bolus administration of intravenous contrast. CONTRAST:  18mL OMNIPAQUE IOHEXOL 300 MG/ML  SOLN COMPARISON:  CT abdomen and pelvis on 01/02/2019 FINDINGS: Lower chest: Lung bases are unremarkable. Stable elevation of the RIGHT hemidiaphragm. Hepatobiliary: No focal liver abnormality is seen. No radiopaque gallstones, biliary dilatation, or pericholecystic inflammatory changes. Pancreas: Unremarkable. No pancreatic ductal dilatation or surrounding inflammatory changes. Spleen: Normal in size without focal abnormality. Adrenals/Urinary Tract: Adrenal glands are normal in appearance. Subcentimeter cysts are identified within the RIGHT kidney. No suspicious renal mass. No hydronephrosis. The ureters are unremarkable. The bladder and visualized portion of the urethra are normal. Stomach/Bowel: Stomach is normal in appearance. Small bowel loops are unremarkable. Colon is normal in appearance. Status post appendectomy. Vascular/Lymphatic: There is atherosclerotic calcification of the abdominal aorta. No associated aneurysm. No retroperitoneal or mesenteric adenopathy. Reproductive: The uterus is present. Intrauterine device has been removed. The LEFT ovary is unremarkable. The RIGHT  ovary is likely posterior to the uterus and mildly enlarged. Other: No ascites.  Anterior abdominal wall is unremarkable. Musculoskeletal: No acute or significant osseous findings. IMPRESSION: 1. No evidence for acute abnormality of the abdomen or pelvis. 2. Status post appendectomy. 3. Intrauterine device has been removed. Mildly enlarged RIGHT ovary. Recommend further evaluation with pelvic ultrasound. 4.  Aortic Atherosclerosis (ICD10-I70.0). Electronically Signed   By: Nolon Nations M.D.   On: 08/07/2020 14:33    Procedures Procedures   Medications Ordered in ED Medications  magnesium citrate solution 1 Bottle (has no administration in time range)  ketorolac (TORADOL) 15 MG/ML injection 15 mg (15 mg Intravenous Given 08/07/20 1240)  iohexol (OMNIPAQUE) 300 MG/ML solution 100 mL (100 mLs Intravenous Contrast Given 08/07/20 1348)    ED Course  I have reviewed the triage vital signs and the nursing notes.  Pertinent labs & imaging results that were available during my care of the patient were reviewed by me and considered in my medical decision making (see chart for details).    MDM Rules/Calculators/A&P                          Labs within normal limits hemoglobin 14.3.  Blood pressure and vital signs otherwise appears stable.  CT abdomen pelvis pursued.  Findings showed no acute pathology noted.  Rectal exam done with nursing chaperone shows no stool in the rectal vault.  At this point patient given magnesium citrate.  Recommended her to increase her MiraLAX to 3 times a day for the next 3 days.  Advised follow-up with her GI doctor in 3 to 4 days.  Advised immediate return for vomiting worsening pain or any additional concerns.  Final Clinical Impression(s) / ED Diagnoses Final diagnoses:  Constipation, unspecified constipation type    Rx / DC Orders ED Discharge Orders    None       Luna Fuse, MD 08/07/20 [phone]

## 2020-08-09 ENCOUNTER — Other Ambulatory Visit: Payer: Self-pay

## 2020-08-09 ENCOUNTER — Telehealth: Payer: Self-pay | Admitting: Gastroenterology

## 2020-08-09 ENCOUNTER — Encounter: Payer: Self-pay | Admitting: Family Medicine

## 2020-08-09 ENCOUNTER — Ambulatory Visit (INDEPENDENT_AMBULATORY_CARE_PROVIDER_SITE_OTHER): Payer: 59 | Admitting: Family Medicine

## 2020-08-09 VITALS — BP 130/72 | HR 97 | Temp 98.1°F | Wt 255.2 lb

## 2020-08-09 DIAGNOSIS — L03116 Cellulitis of left lower limb: Secondary | ICD-10-CM | POA: Diagnosis not present

## 2020-08-09 MED ORDER — AMOXICILLIN-POT CLAVULANATE 500-125 MG PO TABS: 1.0000 | ORAL_TABLET | Freq: Two times a day (BID) | ORAL | 0 refills | Status: DC

## 2020-08-09 NOTE — Progress Notes (Signed)
Subjective:    Patient ID: Alyssa Clay, female    DOB: 04-27-1965, 55 y.o.   MRN: [id number]  Chief Complaint  Patient presents with  . Insect Bite    HPI Patient was seen today for acute concern.  Pt notes a large area of erythema, pruritis, and increased warmth on L lateral thigh.  Pt notice the area last night after sitting in the grass.  Pt does not recall feeling any bites.  Denies fever, chills, n/v.  Tried topical anti itch cream and ice.  Having issues with constipation so was afraid to try anything else.  Past Medical History:  Diagnosis Date  . Asthma   . Depression   . 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 FUO (fever of unknown origin) 10/21/2019  . GERD (gastroesophageal reflux disease)   . Hypertension   . Migraine     No Known Allergies  ROS General: Denies fever, chills, night sweats, changes in weight, changes in appetite HEENT: Denies headaches, ear pain, changes in vision, rhinorrhea, sore throat CV: Denies CP, palpitations, SOB, orthopnea Pulm: Denies SOB, cough, wheezing GI: Denies abdominal pain, nausea, vomiting, diarrhea, constipation GU: Denies dysuria, hematuria, frequency, vaginal discharge Msk: Denies muscle cramps, joint pains Neuro: Denies weakness, numbness, tingling Skin: Denies bruising  +rash on LLE Psych: Denies depression, anxiety, hallucinations     Objective:    Blood pressure 130/72, pulse 97, temperature 98.1 F (36.7 C), temperature source Oral, weight 255 lb 3.2 oz (115.8 kg), SpO2 96 %.  Gen. Pleasant, well-nourished, in no distress, normal affect   HEENT: [place]/AT, face symmetric, conjunctiva clear, no scleral icterus, PERRLA, EOMI, nares patent without drainage Lungs: no accessory muscle use Cardiovascular: RRR, no m/r/g, no peripheral edema Musculoskeletal: No deformities, no cyanosis or clubbing, normal tone Neuro:  A&Ox3, CN II-XII intact, normal gait Skin:  Warm, dry, intact.  Large ~15 cm  slightly raised area of erythema on L lateral thigh with increased warmth.  No target lesions or punctum.   Wt Readings from Last 3 Encounters:  08/09/20 255 lb 3.2 oz (115.8 kg)  08/07/20 255 lb (115.7 kg)  07/29/20 255 lb 12.8 oz (116 kg)    Lab Results  Component Value Date   WBC 8.2 08/07/2020   HGB 14.3 08/07/2020   HCT 43.4 08/07/2020   PLT 305 08/07/2020   GLUCOSE 92 08/07/2020   CHOL 256 (H) 02/25/2020   TRIG 150 (H) 02/25/2020   HDL 51 02/25/2020   LDLCALC 176 (H) 02/25/2020   ALT 15 08/07/2020   AST 14 (L) 08/07/2020   NA 139 08/07/2020   K 3.4 (L) 08/07/2020   CL 102 08/07/2020   CREATININE 1.02 (H) 08/07/2020   BUN 11 08/07/2020   CO2 29 08/07/2020   TSH 1.98 02/25/2020   HGBA1C 5.7 08/19/2019    Assessment/Plan:  Cellulitis of left lower extremity  -supportive care.   -Antihistamine for pruritis.   -Avoid benadryl po given ongoing constipation -given precautions - Plan: amoxicillin-clavulanate (AUGMENTIN) 500-125 MG tablet  F/u prn  Grier Mitts, MD

## 2020-08-09 NOTE — Patient Instructions (Signed)

## 2020-08-09 NOTE — Telephone Encounter (Signed)
Pt states she is still having issues with constipation after going to the ER on Sat. They instructed her to take 3 doses a day and she has only passed a small amt of stool. Discussed with pt that she can take 7 capfuls of miralax and mix with 32oz of gatorade and drink 1 cup every 56min until gone. After this purge pt can take a daily dose of miralax and follow a high fiber diet to prevent this from happening again. Pt verbalized understanding.

## 2020-08-12 NOTE — Progress Notes (Signed)
Follow Up Note  RE: Alyssa Clay MRN: [id number] DOB: December 10, 1965 Date of Office Visit: 08/13/2020  Referring provider: Caren Macadam, MD Primary care provider: Caren Macadam, MD  Chief Complaint: Asthma  History of Present Illness: I had the pleasure of seeing Alyssa Clay for a follow up visit at the [facility] on 08/13/2020. She is a 55 y.o. female, who is being followed for asthma, atelectasis, history of frequent upper respiratory infection, chronic rhinitis and GERD. Her previous allergy office visit was on 03/15/2020 with Dr. Maudie Mercury. Today is a regular follow up visit.  Patient has been taking prednisone for her migraines lately.   Severe persistent asthma ACT score 18.  Patient has been going to the gym and got a new puppy - trying to keep active which is helping with her breathing.   Currently on Symbicort 137mcg 2 puffs twice a day and Fasenra injections at home. She did miss one dose of Fasenra due to insurance changes.   Some shortness of breath with exertion and using albuterol prn with good benefit.   Denies any coughing, wheezing, chest tightness, nocturnal awakenings, ER/urgent care visits since the last visit.  Patient had used Asmanex during a recent upper respiratory infection with good benefit.   Atelectasis No more oxygen needed. Patient has not been consistent with the flutter valve. She is also due for her sleep study per pulmonology.   History of frequent upper respiratory infection One sinus infection since the last OV and was treated with 2 rounds of antibiotics.  No more fevers.   Chronic rhinitis Not using any nasal sprays.  Taking Claritin prn with some benefit but it does cause some dryness.  Using saline spray prn with good benefit.   Gastroesophageal reflux disease Taking Protonix daily and not sure if she is needing it.   Assessment and Plan: [name] is a 55 y.o. female with: Severe persistent  asthma Past history - Spiriva caused coughing. Normal alpha-1 level. Seen by pulmonary Interim history - stable. No prednisone for asthma but taking it for migraines.  ACT score 18  Today's spirometry showed some restriction.   Daily controller medication(s):continue Symbicort 165mcg 2 puffs twice a day with spacer and rinse mouth afterwards. ? Continue Fasenra injections at home every 8 weeks.  ? Do the flutter valve and incentive spirometer. ? Continue Singulair (montelukast) 10mg  daily at night. ? If doing well will discuss stopping it at the next visit.  Prior to physical activity:May use albuterol rescue inhaler 2 puffs 5 to 15 minutes prior to strenuous physical activities.  Rescue medications:May use albuterol rescue inhaler 2 puffs or nebulizer every 4 to 6 hours as needed for shortness of breath, chest tightness, coughing, and wheezing. Monitor frequency of use.   During upper respiratory infections/asthma flares: Start Asmanex 247mcg 1 puff twice a day with spacer and rinse mouth afterwards for 1 week.  Repeat spirometry at next visit.   Atelectasis Past history - 9/9/202 CT chest showed: 1. Prominent elevation of the right hemidiaphragm with associated right lung base atelectasis including complete right middle lobe atelectasis. Right hemidiaphragm paralysis not excluded. Saw pulm. Interim history - no more oxygen. Doing well.    Monitor symptoms and pulse ox.  Use oxygen as needed.  Encouraged using flutter valve and incentive spirometer.   History of frequent upper respiratory infection Past history - frequent bronchitis in her lifetime. Normal basic immune evaluation. Saw rheumatology and ID. Interim history - no  more fevers. 1 sinus infection requiring 2 rounds of antibiotics.   Keep track of infections/bronchitis.  Nonallergic rhinitis Past history - All seasonal and perennial aeroallergen skin tests are negative despite a positive histamine control.  Tried  Flonase, Xhance, Astelin, Xyzal, Periactin, and Claritin without adequate symptom relief.  Interm history - increased symptoms with the pollen. Not using nasal sprays due to epistaxis.   May use Flonase (fluticasone) nasal spray 1 spray per nostril twice a day as needed for nasal congestion.   May use azelastine nasal spray 1-2 sprays per nostril twice a day as needed for runny nose/drainage.  Nasal saline spray (i.e., Simply Saline) or nasal saline lavage (i.e., NeilMed) is recommended as needed and prior to medicated nasal sprays.  Gastroesophageal reflux disease  Continue reflux lifestyle modifications and diet.   Try to use Protonix only if needed.   If you notice worsening symptoms then restart daily use.  Return in about 4 months (around 12/13/2020).  No orders of the defined types were placed in this encounter.  Lab Orders  No laboratory test(s) ordered today    Diagnostics: Spirometry:  Tracings reviewed. Her effort: Good reproducible efforts. FVC: 1.90L FEV1: 1.45L, 51% predicted FEV1/FVC ratio: 76% Interpretation: Spirometry consistent with possible restrictive disease.  Please see scanned spirometry results for details.  Medication List:  Current Outpatient Medications  Medication Sig Dispense Refill  . albuterol (PROAIR HFA) 108 (90 Base) MCG/ACT inhaler Inhale 2 puffs into the lungs every 4 (four) hours as needed for wheezing or shortness of breath. 1 each 1  . albuterol (VENTOLIN HFA) 108 (90 Base) MCG/ACT inhaler Inhale two puffs every 4-6 hours if needed for cough or wheeze 1 each 1  . budesonide-formoterol (SYMBICORT) 160-4.5 MCG/ACT inhaler TAKE 2 PUFFS BY MOUTH TWICE A DAY 10.2 each 2  . buPROPion (WELLBUTRIN XL) 150 MG 24 hr tablet Take 1 tablet (150 mg total) by mouth daily. 90 tablet 1  . calcium carbonate (OSCAL) 1500 (600 Ca) MG TABS tablet Take 600 mg of elemental calcium by mouth 2 (two) times daily with a meal.    . cyclobenzaprine (FLEXERIL) 10 MG  tablet Take 10 mg by mouth 3 (three) times daily as needed (migraines).    . DULoxetine (CYMBALTA) 60 MG capsule TAKE 1 CAPSULE BY MOUTH EVERY DAY 90 capsule 1  . Erenumab-aooe (AIMOVIG) 140 MG/ML SOAJ INJECT 140 MG INTO THE SKIN EVERY 30 (THIRTY) DAYS.    Marland Kitchen FASENRA 30 MG/ML SOSY SECOND SHIP: INJECT ONE SYRINGE UNDER THE SKIN AT WEEK 4 AND 8, THEN EVERY 8 WEEKS THEREAFTER. (Patient taking differently: Inject 30 mg into the skin See admin instructions. Every 8 weeks) 1 Syringe 8  . fluticasone (FLONASE) 50 MCG/ACT nasal spray Place 1 spray into both nostrils daily. 16 g 0  . fluticasone (FLOVENT HFA) 110 MCG/ACT inhaler Inhale 2 puffs into the lungs 2 (two) times daily. During respiratory infections/asthma flares. 1 Inhaler 5  . gabapentin (NEURONTIN) 300 MG capsule Take 300-900 mg by mouth See admin instructions. Take 1 tablet every AM, three tablets at bedtime    . hydrOXYzine (ATARAX/VISTARIL) 25 MG tablet Take 25 mg by mouth 3 (three) times daily as needed.    . meloxicam (MOBIC) 15 MG tablet TAKE 1 TABLET (15 MG TOTAL) BY MOUTH DAILY. 30 tablet 1  . montelukast (SINGULAIR) 10 MG tablet Take 1 tablet (10 mg total) by mouth daily. 30 tablet 5  . Multiple Vitamin (MULTIVITAMIN WITH MINERALS) TABS tablet Take 1 tablet  by mouth daily.    Marland Kitchen nystatin cream (MYCOSTATIN) Apply 1 application topically 2 (two) times daily. 30 g 0  . pantoprazole (PROTONIX) 40 MG tablet TAKE 1 TABLET BY MOUTH 2 TIMES DAILY BEFORE A MEAL. 180 tablet 1  . predniSONE (DELTASONE) 10 MG tablet 5 tablets day 1, 4 tablets day 2, 3 tablets day 3, 2 tablets day 4, 1 tablet day 5, then stop    . Respiratory Therapy Supplies (FLUTTER) DEVI Use 3 times daily as directed 1 each 0  . Rimegepant Sulfate (NURTEC) 75 MG TBDP Take by mouth.    Marland Kitchen tiZANidine (ZANAFLEX) 4 MG tablet Take 4 mg by mouth every 6 (six) hours as needed for muscle spasms.    . traZODone (DESYREL) 100 MG tablet Take 1-2 tablets (100-200 mg total) by mouth at bedtime as  needed for sleep. 180 tablet 3  . valACYclovir (VALTREX) 1000 MG tablet Take 1 tablet (1,000 mg total) by mouth 2 (two) times daily. 180 tablet 1  . albuterol (PROVENTIL) (2.5 MG/3ML) 0.083% nebulizer solution Take 2.5 mg by nebulization every 6 (six) hours as needed for wheezing or shortness of breath. (Patient not taking: Reported on 08/13/2020)     No current facility-administered medications for this visit.   Allergies: No Known Allergies I reviewed her past medical history, social history, family history, and environmental history and no significant changes have been reported from her previous visit.  Review of Systems  Constitutional: Negative for appetite change, chills, fever and unexpected weight change.  HENT: Negative for congestion and rhinorrhea.   Eyes: Negative for itching.  Respiratory: Negative for cough, chest tightness, shortness of breath and wheezing.   Gastrointestinal: Positive for constipation. Negative for abdominal pain.  Skin: Negative for rash.  Allergic/Immunologic: Positive for environmental allergies.  Neurological: Positive for headaches.   Objective: BP (!) 150/80 (BP Location: Left Arm, Patient Position: Sitting, Cuff Size: Large)   Pulse (!) 102   Temp 98 F (36.7 C) (Temporal)   Resp 14   Ht 5\' 5"  (1.651 m)   Wt 256 lb 12.8 oz (116.5 kg)   SpO2 94%   BMI 42.73 kg/m  Body mass index is 42.73 kg/m. Physical Exam Vitals and nursing note reviewed.  Constitutional:      Appearance: Normal appearance. She is well-developed.  HENT:     Head: Normocephalic and atraumatic.     Right Ear: Tympanic membrane and external ear normal.     Left Ear: Tympanic membrane and external ear normal.     Nose: Nose normal.     Mouth/Throat:     Mouth: Mucous membranes are moist.     Pharynx: Oropharynx is clear.  Eyes:     Conjunctiva/sclera: Conjunctivae normal.  Cardiovascular:     Rate and Rhythm: Normal rate and regular rhythm.     Heart sounds: Normal  heart sounds. No murmur heard.   Pulmonary:     Effort: Pulmonary effort is normal.     Breath sounds: Normal breath sounds. No wheezing, rhonchi or rales.  Musculoskeletal:     Cervical back: Neck supple.  Skin:    General: Skin is warm.     Findings: No rash.  Neurological:     Mental Status: She is alert and oriented to person, place, and time.  Psychiatric:        Behavior: Behavior normal.    Previous notes and tests were reviewed. The plan was reviewed with the patient/family, and all questions/concerned were addressed.  It was my pleasure to see Kynlee today and participate in her care. Please feel free to contact me with any questions or concerns.  Sincerely,  Rexene Alberts, DO Allergy & Immunology  [facility] office: [place] office: [phone]

## 2020-08-13 ENCOUNTER — Ambulatory Visit (INDEPENDENT_AMBULATORY_CARE_PROVIDER_SITE_OTHER): Payer: 59 | Admitting: Allergy

## 2020-08-13 ENCOUNTER — Other Ambulatory Visit: Payer: Self-pay

## 2020-08-13 ENCOUNTER — Encounter: Payer: Self-pay | Admitting: Allergy

## 2020-08-13 VITALS — BP 150/80 | HR 102 | Temp 98.0°F | Resp 14 | Ht 65.0 in | Wt 256.8 lb

## 2020-08-13 DIAGNOSIS — J9811 Atelectasis: Secondary | ICD-10-CM | POA: Diagnosis not present

## 2020-08-13 DIAGNOSIS — J310 Chronic rhinitis: Secondary | ICD-10-CM | POA: Diagnosis not present

## 2020-08-13 DIAGNOSIS — J4550 Severe persistent asthma, uncomplicated: Secondary | ICD-10-CM | POA: Diagnosis not present

## 2020-08-13 DIAGNOSIS — Z8709 Personal history of other diseases of the respiratory system: Secondary | ICD-10-CM

## 2020-08-13 DIAGNOSIS — K219 Gastro-esophageal reflux disease without esophagitis: Secondary | ICD-10-CM

## 2020-08-13 NOTE — Assessment & Plan Note (Signed)
   Continue reflux lifestyle modifications and diet.   Try to use Protonix only if needed.   If you notice worsening symptoms then restart daily use.

## 2020-08-13 NOTE — Assessment & Plan Note (Signed)
Past history - All seasonal and perennial aeroallergen skin tests are negative despite a positive histamine control.  Tried Flonase, Xhance, Astelin, Xyzal, Periactin, and Claritin without adequate symptom relief.  Interm history - increased symptoms with the pollen. Not using nasal sprays due to epistaxis.   May use Flonase (fluticasone) nasal spray 1 spray per nostril twice a day as needed for nasal congestion.   May use azelastine nasal spray 1-2 sprays per nostril twice a day as needed for runny nose/drainage.  Nasal saline spray (i.e., Simply Saline) or nasal saline lavage (i.e., NeilMed) is recommended as needed and prior to medicated nasal sprays.

## 2020-08-13 NOTE — Assessment & Plan Note (Signed)
Past history - 9/9/202 CT chest showed: 1. Prominent elevation of the right hemidiaphragm with associated right lung base atelectasis including complete right middle lobe atelectasis. Right hemidiaphragm paralysis not excluded. Saw pulm. Interim history - no more oxygen. Doing well.    Monitor symptoms and pulse ox.  Use oxygen as needed.  Encouraged using flutter valve and incentive spirometer.

## 2020-08-13 NOTE — Patient Instructions (Addendum)
Asthma/atelectasis  Daily controller medication(s):continue Symbicort 145mcg 2 puffs twice a day with spacer and rinse mouth afterwards. ? Continue fasenra injections at home every 8 weeks.  ? Do the flutter valve and incentive spirometer. ? Continue Singulair (montelukast) 10mg  daily at night. ? If doing well will discuss stopping it at the next visit.  Prior to physical activity:May use albuterol rescue inhaler 2 puffs 5 to 15 minutes prior to strenuous physical activities.  Rescue medications:May use albuterol rescue inhaler 2 puffs or nebulizer every 4 to 6 hours as needed for shortness of breath, chest tightness, coughing, and wheezing. Monitor frequency of use.   During upper respiratory infections/asthma flares: Start asmanex 226mcg 1 puff twice a day with spacer and rinse mouth afterwards for 1 week. Asthma control goals:  Full participation in all desired activities (may need albuterol before activity) Albuterol use two times or less a week on average (not counting use with activity) Cough interfering with sleep two times or less a month Oral steroids no more than once a year No hospitalizations  Keep track of infections/bronchitis.  Chronic rhinitis  May use Flonase (fluticasone) nasal spray 1 spray per nostril twice a day as needed for nasal congestion.   May use azelastine nasal spray 1-2 sprays per nostril twice a day as needed for runny nose/drainage.  Nasal saline spray (i.e., Simply Saline) or nasal saline lavage (i.e., NeilMed) is recommended as needed and prior to medicated nasal sprays.  Gastroesophageal reflux disease  Continue reflux lifestyle modifications and diet.   Try to use Protonix only if needed.   If you notice worsening symptoms then please restart.  Follow up in 4 months or sooner if needed.

## 2020-08-13 NOTE — Assessment & Plan Note (Signed)
Past history - Spiriva caused coughing. Normal alpha-1 level. Seen by pulmonary Interim history - stable. No prednisone for asthma but taking it for migraines.  ACT score 18  Today's spirometry showed some restriction.   Daily controller medication(s):continue Symbicort 182mcg 2 puffs twice a day with spacer and rinse mouth afterwards. ? Continue Fasenra injections at home every 8 weeks.  ? Do the flutter valve and incentive spirometer. ? Continue Singulair (montelukast) 10mg  daily at night. ? If doing well will discuss stopping it at the next visit.  Prior to physical activity:May use albuterol rescue inhaler 2 puffs 5 to 15 minutes prior to strenuous physical activities.  Rescue medications:May use albuterol rescue inhaler 2 puffs or nebulizer every 4 to 6 hours as needed for shortness of breath, chest tightness, coughing, and wheezing. Monitor frequency of use.   During upper respiratory infections/asthma flares: Start Asmanex 246mcg 1 puff twice a day with spacer and rinse mouth afterwards for 1 week.  Repeat spirometry at next visit.

## 2020-08-13 NOTE — Assessment & Plan Note (Addendum)
Past history - frequent bronchitis in her lifetime. Normal basic immune evaluation. Saw rheumatology and ID. Interim history - no more fevers. 1 sinus infection requiring 2 rounds of antibiotics.   Keep track of infections/bronchitis.

## 2020-08-23 ENCOUNTER — Other Ambulatory Visit: Payer: Self-pay | Admitting: Allergy

## 2020-08-25 ENCOUNTER — Ambulatory Visit: Payer: 59 | Admitting: Family Medicine

## 2020-08-26 ENCOUNTER — Other Ambulatory Visit: Payer: Self-pay | Admitting: Obstetrics and Gynecology

## 2020-08-26 DIAGNOSIS — N6310 Unspecified lump in the right breast, unspecified quadrant: Secondary | ICD-10-CM

## 2020-08-27 ENCOUNTER — Other Ambulatory Visit: Payer: Self-pay

## 2020-08-30 ENCOUNTER — Other Ambulatory Visit: Payer: Self-pay

## 2020-08-30 ENCOUNTER — Encounter: Payer: Self-pay | Admitting: Family Medicine

## 2020-08-30 ENCOUNTER — Ambulatory Visit (INDEPENDENT_AMBULATORY_CARE_PROVIDER_SITE_OTHER): Payer: 59 | Admitting: Family Medicine

## 2020-08-30 VITALS — BP 130/82 | HR 97 | Temp 98.0°F | Ht 65.0 in | Wt 255.6 lb

## 2020-08-30 DIAGNOSIS — R7301 Impaired fasting glucose: Secondary | ICD-10-CM

## 2020-08-30 DIAGNOSIS — G4733 Obstructive sleep apnea (adult) (pediatric): Secondary | ICD-10-CM | POA: Diagnosis not present

## 2020-08-30 DIAGNOSIS — J4550 Severe persistent asthma, uncomplicated: Secondary | ICD-10-CM | POA: Diagnosis not present

## 2020-08-30 DIAGNOSIS — K219 Gastro-esophageal reflux disease without esophagitis: Secondary | ICD-10-CM

## 2020-08-30 DIAGNOSIS — I10 Essential (primary) hypertension: Secondary | ICD-10-CM | POA: Diagnosis not present

## 2020-08-30 DIAGNOSIS — M533 Sacrococcygeal disorders, not elsewhere classified: Secondary | ICD-10-CM

## 2020-08-30 DIAGNOSIS — R7989 Other specified abnormal findings of blood chemistry: Secondary | ICD-10-CM | POA: Diagnosis not present

## 2020-08-30 DIAGNOSIS — E785 Hyperlipidemia, unspecified: Secondary | ICD-10-CM

## 2020-08-30 LAB — LDL CHOLESTEROL, DIRECT: Direct LDL: 175 mg/dL

## 2020-08-30 LAB — LIPID PANEL
Cholesterol: 253 mg/dL — ABNORMAL HIGH (ref 0–200)
HDL: 54.9 mg/dL (ref >39.00)
NonHDL: 197.91
Total CHOL/HDL Ratio: 5
Triglycerides: 206 mg/dL — ABNORMAL HIGH (ref 0.0–149.0)
VLDL: 41.2 mg/dL — ABNORMAL HIGH (ref 0.0–40.0)

## 2020-08-30 LAB — BASIC METABOLIC PANEL
BUN: 12 mg/dL (ref 6–23)
CO2: 32 mEq/L (ref 19–32)
Calcium: 9.4 mg/dL (ref 8.4–10.5)
Chloride: 104 mEq/L (ref 96–112)
Creatinine, Ser: 1.04 mg/dL (ref 0.40–1.20)
GFR: 61.76 mL/min (ref >60.00)
Glucose, Bld: 110 mg/dL — ABNORMAL HIGH (ref 70–99)
Potassium: 4.9 mEq/L (ref 3.5–5.1)
Sodium: 141 mEq/L (ref 135–145)

## 2020-08-30 LAB — HEMOGLOBIN A1C: Hgb A1c MFr Bld: 5.8 % (ref 4.6–6.5)

## 2020-08-30 MED ORDER — ESTRADIOL 10 MCG VA TABS: 10.0000 ug | ORAL_TABLET | Freq: Every day | VAGINAL | 0 refills | Status: AC

## 2020-08-30 NOTE — Addendum Note (Signed)
Addended by: Coretta Leisey L on: 08/30/2020 10:24 AM   Modules accepted: Orders  

## 2020-08-30 NOTE — Patient Instructions (Signed)
*  contact Ashok Pall - let her know that your prior Josem Kaufmann has not gone through.  -important for insurance to know that your migraines were controlled on this medication and that you were already on it.

## 2020-08-30 NOTE — Progress Notes (Signed)
Alyssa Clay DOB: Oct 07, 1965 Encounter date: 08/30/2020  This is a 55 y.o. female who presents with Chief Complaint  Patient presents with  . Follow-up    History of present illness: Last visit with me was 02/2020.   Got a new puppy - a Corgie - reached over outdoor pen and did something - getting snapping/popping in right upper gluteus. Still has movement, but there are some things that bother her - and occasionally gets popping still. Did this 4 weeks ago. Not getting better. No numbness/tingling. Feels ok in between the "pops' - just certain movements that bother it. Initially was very sore; so is better but really gets uncomfortable with specific movement.   Headaches: has had bad migraines and she is in insurance transition and has not been able to get nurtec. Nurtec is not going through with current insurance. She has called migraine doctor.   Has two lumps under right breast - can't get in until June for diagnostic mammogram.   Sleep apnea: she hasn't done the sleep evaluation yet; went there and laid down and then got headache.  Asthma/restrictive lung disease: Follows with pulmonology regularly. States that breathing overall is better.  Acid reflux: Protonix 40 mg daily. She is using as needed per asthma doc. She is doing well with this.  Following regularly with dermatology, gynecology.  Last Pap smear 04/2017. Colon cancer screening.  Patient was going to complete this after her last visit, but I did not get report. She is getting this done in 2 weeks.   No Known Allergies Current Meds  Medication Sig  . albuterol (PROAIR HFA) 108 (90 Base) MCG/ACT inhaler Inhale 2 puffs into the lungs every 4 (four) hours as needed for wheezing or shortness of breath.  Marland Kitchen albuterol (PROVENTIL) (2.5 MG/3ML) 0.083% nebulizer solution Take 2.5 mg by nebulization every 6 (six) hours as needed for wheezing or shortness of breath. (Patient not taking: Reported on 09/01/2020)  .  budesonide-formoterol (SYMBICORT) 160-4.5 MCG/ACT inhaler USE 2 INHALATIONS BY MOUTH  TWICE DAILY  . buPROPion (WELLBUTRIN XL) 150 MG 24 hr tablet Take 1 tablet (150 mg total) by mouth daily.  . calcium carbonate (OSCAL) 1500 (600 Ca) MG TABS tablet Take 600 mg of elemental calcium by mouth 2 (two) times daily with a meal.  . cyclobenzaprine (FLEXERIL) 10 MG tablet Take 10 mg by mouth 3 (three) times daily as needed (migraines).  . DULoxetine (CYMBALTA) 60 MG capsule TAKE 1 CAPSULE BY MOUTH EVERY DAY  . Erenumab-aooe (AIMOVIG) 140 MG/ML SOAJ INJECT 140 MG INTO THE SKIN EVERY 30 (THIRTY) DAYS.  . Estradiol (YUVAFEM) 10 MCG TABS vaginal tablet Place 1 tablet (10 mcg total) vaginally daily.  Marland Kitchen FASENRA 30 MG/ML SOSY SECOND SHIP: INJECT ONE SYRINGE UNDER THE SKIN AT WEEK 4 AND 8, THEN EVERY 8 WEEKS THEREAFTER. (Patient taking differently: Inject 30 mg into the skin See admin instructions. Every 8 weeks)  . fluticasone (FLONASE) 50 MCG/ACT nasal spray Place 1 spray into both nostrils daily. (Patient not taking: Reported on 09/01/2020)  . fluticasone (FLOVENT HFA) 110 MCG/ACT inhaler Inhale 2 puffs into the lungs 2 (two) times daily. During respiratory infections/asthma flares.  . gabapentin (NEURONTIN) 300 MG capsule Take 300-900 mg by mouth See admin instructions. Take 1 tablet every AM, three tablets at bedtime  . hydrOXYzine (ATARAX/VISTARIL) 25 MG tablet Take 25 mg by mouth 3 (three) times daily as needed.  . meloxicam (MOBIC) 15 MG tablet TAKE 1 TABLET (15 MG TOTAL) BY MOUTH  DAILY.  . montelukast (SINGULAIR) 10 MG tablet Take 1 tablet (10 mg total) by mouth daily.  . Multiple Vitamin (MULTIVITAMIN WITH MINERALS) TABS tablet Take 1 tablet by mouth daily.  Marland Kitchen nystatin cream (MYCOSTATIN) Apply 1 application topically 2 (two) times daily. (Patient not taking: Reported on 09/01/2020)  . pantoprazole (PROTONIX) 40 MG tablet TAKE 1 TABLET BY MOUTH 2 TIMES DAILY BEFORE A MEAL.  Marland Kitchen Respiratory Therapy Supplies  (FLUTTER) DEVI Use 3 times daily as directed  . Rimegepant Sulfate (NURTEC) 75 MG TBDP Take by mouth. (Patient not taking: Reported on 09/01/2020)  . tiZANidine (ZANAFLEX) 4 MG tablet Take 4 mg by mouth every 6 (six) hours as needed for muscle spasms.  . traZODone (DESYREL) 100 MG tablet Take 1-2 tablets (100-200 mg total) by mouth at bedtime as needed for sleep.  . valACYclovir (VALTREX) 1000 MG tablet Take 1 tablet (1,000 mg total) by mouth 2 (two) times daily.    Review of Systems  Constitutional: Negative for chills, fatigue and fever.  Respiratory: Negative for cough, chest tightness, shortness of breath and wheezing.   Cardiovascular: Negative for chest pain, palpitations and leg swelling.  Musculoskeletal: Positive for back pain.    Objective:  BP 130/82 (BP Location: Left Arm, Patient Position: Sitting, Cuff Size: Large)   Pulse 97   Temp 98 F (36.7 C) (Oral)   Ht 5\' 5"  (1.651 m)   Wt 255 lb 9.6 oz (115.9 kg)   SpO2 95%   BMI 42.53 kg/m   Weight: 255 lb 9.6 oz (115.9 kg)   BP Readings from Last 3 Encounters:  08/30/20 130/82  08/13/20 (!) 150/80  08/09/20 130/72   Wt Readings from Last 3 Encounters:  09/01/20 255 lb (115.7 kg)  08/30/20 255 lb 9.6 oz (115.9 kg)  08/13/20 256 lb 12.8 oz (116.5 kg)    Physical Exam Constitutional:      General: She is not in acute distress.    Appearance: She is well-developed.  Cardiovascular:     Rate and Rhythm: Normal rate and regular rhythm.     Heart sounds: Normal heart sounds. No murmur heard. No friction rub.  Pulmonary:     Effort: Pulmonary effort is normal. No respiratory distress.     Breath sounds: Normal breath sounds. No wheezing or rales.  Musculoskeletal:     Right lower leg: No edema.     Left lower leg: No edema.     Comments: Right sacroiliac spasm with tenderness to palpation.  Range of motion is quite good.  She does have some discomfort with full external rotation of right hip and flexion of back.   Overall back mobility is quite good.  Neurological:     Mental Status: She is alert and oriented to person, place, and time.  Psychiatric:        Behavior: Behavior normal.     Assessment/Plan  1. Primary hypertension Blood pressure is stable today. Encouraged her to check at home.  She is currently not taking any medications.  2. Severe persistent asthma, unspecified whether complicated Breathing has been well controlled.  Albuterol as needed, Symbicort 162 puffs twice daily, allergy control with Flonase.  3. OSA (obstructive sleep apnea) She needs to complete evaluation.  This may be also contributing to her headaches.  4. Gastroesophageal reflux disease, unspecified whether esophagitis present This is been well controlled.  Continue with Protonix  5. Pain of right sacroiliac joint I have encouraged her to follow-up with sports medicine.  I  feel that she would do well from osteopathic manipulation.  Pain has improved, but is not resolved. - Ambulatory referral to Sports Medicine    Return in about 6 months (around 03/02/2021) for physical exam.    Micheline Rough, MD

## 2020-08-30 NOTE — Addendum Note (Signed)
Addended by: Janann Colonel on: 08/30/2020 10:24 AM   Modules accepted: Orders

## 2020-09-01 ENCOUNTER — Ambulatory Visit (AMBULATORY_SURGERY_CENTER): Payer: 59 | Admitting: *Deleted

## 2020-09-01 ENCOUNTER — Other Ambulatory Visit: Payer: Self-pay

## 2020-09-01 VITALS — Ht 65.0 in | Wt 255.0 lb

## 2020-09-01 DIAGNOSIS — Z1211 Encounter for screening for malignant neoplasm of colon: Secondary | ICD-10-CM

## 2020-09-01 NOTE — Progress Notes (Signed)
Pt verified name, DOB, address and insurance during PV today. Pt mailed instruction packet to included paper to complete and mail back to [facility] with addressed and stamped envelope, Emmi video, copy of consent form to read and not return, and instructions. PV completed over the phone. Pt encouraged to call with questions or issues.  My Chart instructions to pt as well   Pt has a suprep at home  No egg or soy allergy known to patient   issues with past sedation with any surgeries or procedures- hard time waking up post op  Patient denies ever being told they had issues or difficulty with intubation !! No FH of Malignant Hyperthermia No diet pills per patient No home 02 use per patient  No blood thinners per patient  Pt states  issues with constipation - hard stools- 2 day prep   No A fib or A flutter  EMMI video to pt or via [place] 19 guidelines implemented in PV today with Pt and RN  Pt is fully vaccinated  for Covid    Due to the COVID-19 pandemic we are asking patients to follow certain guidelines.  Pt aware of COVID protocols and [facility] guidelines

## 2020-09-09 ENCOUNTER — Telehealth: Payer: Self-pay | Admitting: Family Medicine

## 2020-09-09 NOTE — Telephone Encounter (Signed)
The patient received this letter and was wanting to know if she needs to make an appointment with Dr. Ethlyn Gallery or have the medical assistant to call her with the results of the incidental findings.  She is supposed to be having a procedure next week.   The final report of your imaging, CT ABDOMEN PELVIS W CONTRAST, completed on 08/07/2020 included the identification of a clinically significant incidental finding. An incidental finding is something extra found by the test and is not related to the reason your doctor ordered the test.  [place] encourages you to call your primary care physician to find out if there are any concerns specific to the finding and/or if any follow up is needed.   Can we work this patient in tomorrow or before 05/25?

## 2020-09-10 ENCOUNTER — Encounter: Payer: Self-pay | Admitting: Gastroenterology

## 2020-09-10 ENCOUNTER — Encounter: Payer: Self-pay | Admitting: Family Medicine

## 2020-09-10 NOTE — Telephone Encounter (Signed)
Patient informed of the message below.

## 2020-09-10 NOTE — Telephone Encounter (Signed)
Only incidental finding I see is mildly enlarged ovary - these are not well seen on CT. Suggestion was pelvic US. I would just suggest making follow up appointment with her gynecologist and I do not feel this is urgent before next week. Could call their office today to set up follow up. They may order [place] prior to seeing her.

## 2020-09-14 ENCOUNTER — Ambulatory Visit: Payer: 59 | Admitting: Family Medicine

## 2020-09-15 ENCOUNTER — Encounter: Payer: Self-pay | Admitting: Gastroenterology

## 2020-09-15 ENCOUNTER — Ambulatory Visit (AMBULATORY_SURGERY_CENTER): Payer: 59 | Admitting: Gastroenterology

## 2020-09-15 ENCOUNTER — Other Ambulatory Visit: Payer: Self-pay

## 2020-09-15 VITALS — BP 134/72 | HR 78 | Temp 98.1°F | Resp 16 | Ht 65.0 in | Wt 255.0 lb

## 2020-09-15 DIAGNOSIS — K6289 Other specified diseases of anus and rectum: Secondary | ICD-10-CM | POA: Diagnosis not present

## 2020-09-15 DIAGNOSIS — K621 Rectal polyp: Secondary | ICD-10-CM

## 2020-09-15 DIAGNOSIS — K625 Hemorrhage of anus and rectum: Secondary | ICD-10-CM

## 2020-09-15 DIAGNOSIS — N838 Other noninflammatory disorders of ovary, fallopian tube and broad ligament: Secondary | ICD-10-CM

## 2020-09-15 DIAGNOSIS — D128 Benign neoplasm of rectum: Secondary | ICD-10-CM

## 2020-09-15 DIAGNOSIS — Z1211 Encounter for screening for malignant neoplasm of colon: Secondary | ICD-10-CM

## 2020-09-15 HISTORY — DX: Other noninflammatory disorders of ovary, fallopian tube and broad ligament: N83.8

## 2020-09-15 MED ORDER — SODIUM CHLORIDE 0.9 % IV SOLN: 500.0000 mL | Freq: Once | INTRAVENOUS | Status: DC

## 2020-09-15 NOTE — Progress Notes (Signed)
PT taken to PACU. Monitors in place. VSS. Report given to RN. 

## 2020-09-15 NOTE — Progress Notes (Signed)
Called to room to assist during endoscopic procedure.  Patient ID and intended procedure confirmed with present staff. Received instructions for my participation in the procedure from the performing physician.  

## 2020-09-15 NOTE — Progress Notes (Signed)
[facility] [place] Phone: [phone] Subjective:   [name], am serving as a scribe for Alyssa Clay. This visit occurred during the SARS-CoV-2 public health emergency.  Safety protocols were in place, including screening questions prior to the visit, additional usage of staff PPE, and extensive cleaning of exam room while observing appropriate contact time as indicated for disinfecting solutions.   I'm seeing this patient by the request  of:  Alyssa Macadam, MD  CC: low back pain   [id number]  Alyssa Clay is a 55 y.o. female coming in with complaint of SI joint pain for one month after reaching over pen to grab her puppy. Patient does have pain with some unidentified movements in the gym. Pain is over right glute. Denies any radiating symptoms. Stretching for pain relief. Uses meloxicam for shoulder pain prescribed by Alyssa Clay.   Patient did have a mildly enlarged ovary noted on most recent CT scan.  In addition to the patient's gynecologist and they are monitoring she states.  Patient denies any fevers, chills, any abnormal weight loss.     Past Medical History:  Diagnosis Date  . Allergy   . Anemia   . Asthma   . Constipation    issues x the past month -? due to Migraines   . Depression   . 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 Enlarged ovary 09/15/2020   noted on ct scan, per pt  . FUO (fever of unknown origin) 10/21/2019  . GERD (gastroesophageal reflux disease)   . Hypertension    PAST hx   . Migraine    Past Surgical History:  Procedure Laterality Date  . APPENDECTOMY    . BREAST REDUCTION SURGERY Bilateral 06/12/2019   Procedure: BREAST REDUCTION WITH LIPOSUCTION;  Surgeon: Alyssa Going, DO;  Location: [facility];  Service: Plastics;  Laterality: Bilateral;  4 hours, please  . CESAREAN SECTION  1994  . DILATION  AND CURETTAGE OF UTERUS     multiple due to miscarriages  . KNEE ARTHROSCOPY Left 2013  . LAPAROSCOPIC APPENDECTOMY N/A 12/08/2018   Procedure: APPENDECTOMY LAPAROSCOPIC;  Surgeon: Donnie Mesa, MD;  Location: [facility];  Service: General;  Laterality: N/A;  . TENNIS ELBOW RELEASE/NIRSCHEL PROCEDURE  2009  . WISDOM TOOTH EXTRACTION     Social History   Socioeconomic History  . Marital status: Married    Spouse name: Not on file  . Number of children: Not on file  . Years of education: Not on file  . Highest education level: Not on file  Occupational History  . 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 Smoking status: Never Smoker  . Smokeless tobacco: Never Used  Vaping Use  . Vaping Use: Never used  Substance and Sexual Activity  . Alcohol use: Yes    Comment: occ 2-3 beers a month   . Drug use: Never  . Sexual activity: Yes    Partners: Male  Other Topics Concern  . Not on file  Social History Narrative  . Not on file   Social Determinants of Health   Financial Resource Strain: Not on file  Food Insecurity: Not on file  Transportation Needs: Not on file  Physical Activity: Not on file  Stress: Not on file  Social Connections: Not on file   No Known Allergies Family History  Problem Relation Age of  Onset  . Depression Mother   . Lymphoma Mother 60  . Hypertension Father   . Heart attack Father 11  . Hypertension Brother   . High blood pressure Brother   . High Cholesterol Brother   . Healthy Daughter   . Colon cancer Maternal Aunt 35  . Esophageal cancer Neg Hx   . Pancreatic cancer Neg Hx   . Stomach cancer Neg Hx   . Colon polyps Neg Hx   . Rectal cancer Neg Hx         Current Outpatient Medications (Respiratory):  .  albuterol (PROAIR HFA) 108 (90 Base) MCG/ACT inhaler, Inhale 2 puffs into the lungs every 4 (four) hours as needed for wheezing or shortness of breath. Marland Kitchen  albuterol (PROVENTIL) (2.5 MG/3ML) 0.083% nebulizer solution, Take 2.5 mg by nebulization  every 6 (six) hours as needed for wheezing or shortness of breath. .  budesonide-formoterol (SYMBICORT) 160-4.5 MCG/ACT inhaler, USE 2 INHALATIONS BY MOUTH  TWICE DAILY .  FASENRA 30 MG/ML SOSY, SECOND SHIP: INJECT ONE SYRINGE UNDER THE SKIN AT WEEK 4 AND 8, THEN EVERY 8 WEEKS THEREAFTER. (Patient taking differently: Inject 30 mg into the skin See admin instructions. Every 8 weeks) .  fluticasone (FLONASE) 50 MCG/ACT nasal spray, Place 1 spray into both nostrils daily. .  fluticasone (FLOVENT HFA) 110 MCG/ACT inhaler, Inhale 2 puffs into the lungs 2 (two) times daily. During respiratory infections/asthma flares. .  montelukast (SINGULAIR) 10 MG tablet, Take 1 tablet (10 mg total) by mouth daily.   Current Outpatient Medications (Analgesics):  Marland Kitchen  Erenumab-aooe (AIMOVIG) 140 MG/ML SOAJ, INJECT 140 MG INTO THE SKIN EVERY 30 (THIRTY) DAYS. Marland Kitchen  meloxicam (MOBIC) 15 MG tablet, TAKE 1 TABLET (15 MG TOTAL) BY MOUTH DAILY. Marland Kitchen  Rimegepant Sulfate (NURTEC) 75 MG TBDP, Take by mouth. .  rizatriptan (MAXALT) 10 MG tablet, TAKE 1 TABLET BY MOUTH ONCE AS NEEDED FOR MIGRAINE FOR UP TO 1 DOSE. MAY REPEAT IN 2 HOURS IF NEEDED     Current Outpatient Medications (Other):  Marland Kitchen  buPROPion (WELLBUTRIN XL) 150 MG 24 hr tablet, Take 1 tablet (150 mg total) by mouth daily. .  calcium carbonate (OSCAL) 1500 (600 Ca) MG TABS tablet, Take 600 mg of elemental calcium by mouth 2 (two) times daily with a meal. .  cyclobenzaprine (FLEXERIL) 10 MG tablet, Take 10 mg by mouth 3 (three) times daily as needed (migraines). .  DULoxetine (CYMBALTA) 60 MG capsule, TAKE 1 CAPSULE BY MOUTH EVERY DAY .  Estradiol (YUVAFEM) 10 MCG TABS vaginal tablet, Place 1 tablet (10 mcg total) vaginally daily. Marland Kitchen  gabapentin (NEURONTIN) 300 MG capsule, Take 300-900 mg by mouth See admin instructions. Take 1 tablet every AM, three tablets at bedtime .  hydrOXYzine (ATARAX/VISTARIL) 25 MG tablet, Take 25 mg by mouth 3 (three) times daily as needed. .   Multiple Vitamin (MULTIVITAMIN WITH MINERALS) TABS tablet, Take 1 tablet by mouth daily. Marland Kitchen  nystatin cream (MYCOSTATIN), Apply 1 application topically 2 (two) times daily. .  pantoprazole (PROTONIX) 40 MG tablet, TAKE 1 TABLET BY MOUTH 2 TIMES DAILY BEFORE A MEAL. Marland Kitchen  Respiratory Therapy Supplies (FLUTTER) DEVI, Use 3 times daily as directed .  tiZANidine (ZANAFLEX) 4 MG tablet, Take 4 mg by mouth every 6 (six) hours as needed for muscle spasms. .  traZODone (DESYREL) 100 MG tablet, Take 1-2 tablets (100-200 mg total) by mouth at bedtime as needed for sleep. .  valACYclovir (VALTREX) 1000 MG tablet, Take 1 tablet (1,000  mg total) by mouth 2 (two) times daily.  Current Facility-Administered Medications (Other):  .  0.9 %  sodium chloride infusion   Reviewed prior external information including notes and imaging from  primary care provider As well as notes that were available from care everywhere and other healthcare systems.  Past medical history, social, surgical and family history all reviewed in electronic medical record.  No pertanent information unless stated regarding to the chief complaint.   Review of Systems:  No headache, visual changes, nausea, vomiting, diarrhea, constipation, dizziness, abdominal pain, skin rash, fevers, chills, night sweats, weight loss, swollen lymph nodes, body aches, joint swelling, chest pain, shortness of breath, mood changes. POSITIVE muscle aches  Objective  Blood pressure 112/82, pulse (!) 103, height 5\' 5"  (1.651 m), weight 253 lb (114.8 kg), last menstrual period 12/20/2018, SpO2 96 %.   General: No apparent distress alert and oriented x3 mood and affect normal, dressed appropriately.  HEENT: Pupils equal, extraocular movements intact  Respiratory: Patient's speak in full sentences and does not appear short of breath  Cardiovascular: No lower extremity edema, non tender, no erythema  Gait normal with good balance and coordination.  MSK:  Non tender  with full range of motion and good stability and symmetric strength and tone of shoulders, elbows, wrist, hip, knee and ankles bilaterally.  Patient's low back does have some mild positive Trendelenburg on the right side.  Tender to palpation over the sacroiliac joint.  Positive FABER test noted.  Negative straight leg test.  Patient is severely tender over the sacroiliac joint but otherwise fairly unremarkable.  5 out of 5 strength in lower extremity.  Osteopathic findings T7 extended rotated and side bent right L4 flexed rotated and side bent left Sacrum right on right    [id number]; 15 additional minutes spent for Therapeutic exercises as stated in above notes.  This included exercises focusing on stretching, strengthening, with significant focus on eccentric aspects.   Long term goals include an improvement in range of motion, strength, endurance as well as avoiding reinjury. Patient's frequency would include in 1-2 times a day, 3-5 times a week for a duration of 6-12 weeks. Low back exercises that included:  Pelvic tilt/bracing instruction to focus on control of the pelvic girdle and lower abdominal muscles  Glute strengthening exercises, focusing on proper firing of the glutes without engaging the low back muscles Proper stretching techniques for maximum relief for the hamstrings, hip flexors, low back and some rotation where tolerated    Proper technique shown and discussed handout in great detail with ATC.  All questions were discussed and answered.   Impression and Recommendations:     The above documentation has been reviewed and is accurate and complete Lyndal Pulley, DO

## 2020-09-15 NOTE — Progress Notes (Signed)
Vs by CW.  previsit done via phone.  No changes since previsit

## 2020-09-15 NOTE — Op Note (Signed)
[facility] Patient Name: Alyssa Clay Procedure Date: 09/15/2020 9:05 AM MRN: [id number] Endoscopist: Thornton Park MD, MD Age: 55 Referring MD:  Date of Birth: Jun 06, 1965 Gender: Female Account #: [id number] Procedure:                Colonoscopy Indications:              Rectal bleeding, chronic constipation, no prior                            colon cancer screening Medicines:                Monitored Anesthesia Care Procedure:                Pre-Anesthesia Assessment:                           - Prior to the procedure, a History and Physical                            was performed, and patient medications and                            allergies were reviewed. The patient's tolerance of                            previous anesthesia was also reviewed. The risks                            and benefits of the procedure and the sedation                            options and risks were discussed with the patient.                            All questions were answered, and informed consent                            was obtained. Prior Anticoagulants: The patient has                            taken no previous anticoagulant or antiplatelet                            agents. ASA Grade Assessment: II - A patient with                            mild systemic disease. After reviewing the risks                            and benefits, the patient was deemed in                            satisfactory condition to undergo the procedure.  After obtaining informed consent, the colonoscope                            was passed under direct vision. Throughout the                            procedure, the patient's blood pressure, pulse, and                            oxygen saturations were monitored continuously. The                            Olympus CF-HQ190L (Serial# [id number]) Colonoscope was                            introduced through the anus and  advanced to the 3                            cm into the ileum. The colonoscopy was performed                            without difficulty. The patient tolerated the                            procedure well. The quality of the bowel                            preparation was good. The terminal ileum, ileocecal                            valve, appendiceal orifice, and rectum were                            photographed. Scope In: 9:12:39 AM Scope Out: 9:31:39 AM Scope Withdrawal Time: 0 hours 16 minutes 19 seconds  Total Procedure Duration: 0 hours 19 minutes 0 seconds  Findings:                 The perianal and digital rectal examinations were                            normal.                           Two ulcers were present in the rectum. One was                            round, the other was linear. No bleeding was                            present. Stigmata of recent bleeding were present.                            Biopsies were taken with a cold forceps for  histology. Estimated blood loss was minimal.                           Two sessile polyps were found in the rectum. The                            polyps were 1 to 2 mm in size. These polyps were                            removed with a cold snare. Resection and retrieval                            were complete. Estimated blood loss was minimal.                           The exam was otherwise without abnormality on                            direct and retroflexion views. Complications:            No immediate complications. Estimated blood loss:                            Minimal. Estimated Blood Loss:     Estimated blood loss was minimal. Impression:               - A few ulcers in the rectum. Biopsied.                           - Two 1 to 2 mm polyps in the rectum, removed with                            a cold snare. Resected and retrieved.                           - The examination was  otherwise normal on direct                            and retroflexion views. Recommendation:           - Patient has a contact number available for                            emergencies. The signs and symptoms of potential                            delayed complications were discussed with the                            patient. Return to normal activities tomorrow.                            Written discharge instructions were provided to the  patient.                           - Follow a high fiber diet. Drink at least 64                            ounces of water daily. Add a daily stool bulking                            agent such as psyllium (an exampled would be                            Metamucil).                           - Await pathology results.                           - Plan Carafate enemas (2g BID x 6 weeks) if                            pathology results confirm solitary rectal ulcer                            syndrome.                           - Repeat colonoscopy date to be determined after                            pending pathology results are reviewed for                            surveillance.                           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                           - Emerging evidence supports eating a diet of                            fruits, vegetables, grains, calcium, and yogurt                            while reducing red meat and alcohol may reduce the                            risk of colon cancer.                           - Thank you for allowing me to be involved in your  colon cancer prevention. Thornton Park MD, MD 09/15/2020 9:41:28 AM This report has been signed electronically.

## 2020-09-15 NOTE — Patient Instructions (Signed)
YOU HAD AN ENDOSCOPIC PROCEDURE TODAY AT [facility]:   Refer to the procedure report that was given to you for any specific questions about what was found during the examination.  If the procedure report does not answer your questions, please call your gastroenterologist to clarify.  If you requested that your care partner not be given the details of your procedure findings, then the procedure report has been included in a sealed envelope for you to review at your convenience later.  YOU SHOULD EXPECT: Some feelings of bloating in the abdomen. Passage of more gas than usual.  Walking can help get rid of the air that was put into your GI tract during the procedure and reduce the bloating. If you had a lower endoscopy (such as a colonoscopy or flexible sigmoidoscopy) you may notice spotting of blood in your stool or on the toilet paper. If you underwent a bowel prep for your procedure, you may not have a normal bowel movement for a few days.  Please Note:  You might notice some irritation and congestion in your nose or some drainage.  This is from the oxygen used during your procedure.  There is no need for concern and it should clear up in a day or so.  SYMPTOMS TO REPORT IMMEDIATELY:   Following lower endoscopy (colonoscopy or flexible sigmoidoscopy):  Excessive amounts of blood in the stool  Significant tenderness or worsening of abdominal pains  Swelling of the abdomen that is new, acute  Fever of 100F or higher    For urgent or emergent issues, a gastroenterologist can be reached at any hour by calling [phone]. Do not use MyChart messaging for urgent concerns.    DIET:  We do recommend a small meal at first, but then you may proceed to your regular diet.  Drink plenty of fluids but you should avoid alcoholic beverages for 24 hours. Follow a High Fiber Diet and drink at least 64 ounces of water daily. Add a stool bulking agent such as psyllium (an example would be  Metamucil).  MEDICATIONS: Continue present medications.   FOLLOW UP: Plan Carafate enemas (2g twice daily for 6 weeks) if pathology results confirm solitary rectal ulcer syndrome. Dr. 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  Thank you for allowing [place] to provide for your healthcare needs today.  Please see handouts given to you by your recovery nurse.  ACTIVITY:  You should plan to take it easy for the rest of today and you should NOT DRIVE or use heavy machinery until tomorrow (because of the sedation medicines used during the test).    FOLLOW UP: Our staff will call the number listed on your records 48-72 hours following your procedure to check on you and address any questions or concerns that you may have regarding the information given to you following your procedure. If we do not reach you, we will leave a message.  We will attempt to reach you two times.  During this call, we will ask if you have developed any symptoms of COVID 19. If you develop any symptoms (ie: fever, flu-like symptoms, shortness of breath, cough etc.) before then, please call [phone].  If you test positive for Covid 19 in the 2 weeks post procedure, please call and report this information to [place].    If any biopsies were taken you will be contacted by phone or by letter within the next 1-3 weeks.  Please call us at [phone] if you have not heard about the biopsies in 3 weeks.    SIGNATURES/CONFIDENTIALITY: You and/or your care partner have signed paperwork which will be entered into your electronic medical record.  These signatures attest to the fact that that the information above on your After Visit Summary has been reviewed and is understood.  Full responsibility of the confidentiality of this discharge information lies with you and/or your care-partner.

## 2020-09-16 ENCOUNTER — Ambulatory Visit (INDEPENDENT_AMBULATORY_CARE_PROVIDER_SITE_OTHER): Payer: 59 | Admitting: Family Medicine

## 2020-09-16 ENCOUNTER — Ambulatory Visit (INDEPENDENT_AMBULATORY_CARE_PROVIDER_SITE_OTHER): Payer: 59

## 2020-09-16 ENCOUNTER — Other Ambulatory Visit: Payer: Self-pay

## 2020-09-16 ENCOUNTER — Encounter: Payer: Self-pay | Admitting: Family Medicine

## 2020-09-16 VITALS — BP 112/82 | HR 103 | Ht 65.0 in | Wt 253.0 lb

## 2020-09-16 DIAGNOSIS — M9903 Segmental and somatic dysfunction of lumbar region: Secondary | ICD-10-CM

## 2020-09-16 DIAGNOSIS — M9904 Segmental and somatic dysfunction of sacral region: Secondary | ICD-10-CM | POA: Diagnosis not present

## 2020-09-16 DIAGNOSIS — M9902 Segmental and somatic dysfunction of thoracic region: Secondary | ICD-10-CM

## 2020-09-16 DIAGNOSIS — M533 Sacrococcygeal disorders, not elsewhere classified: Secondary | ICD-10-CM | POA: Diagnosis not present

## 2020-09-16 DIAGNOSIS — M545 Low back pain, unspecified: Secondary | ICD-10-CM | POA: Diagnosis not present

## 2020-09-16 IMAGING — DX DG LUMBAR SPINE 2-3V
3 series · 3 of 3 positions shown · non-contrast
Comparison: [DATE]

CLINICAL DATA: Right sacroiliac pain for 1 month

EXAM:
LUMBAR SPINE - 2-3 VIEW

[l-spine ap]
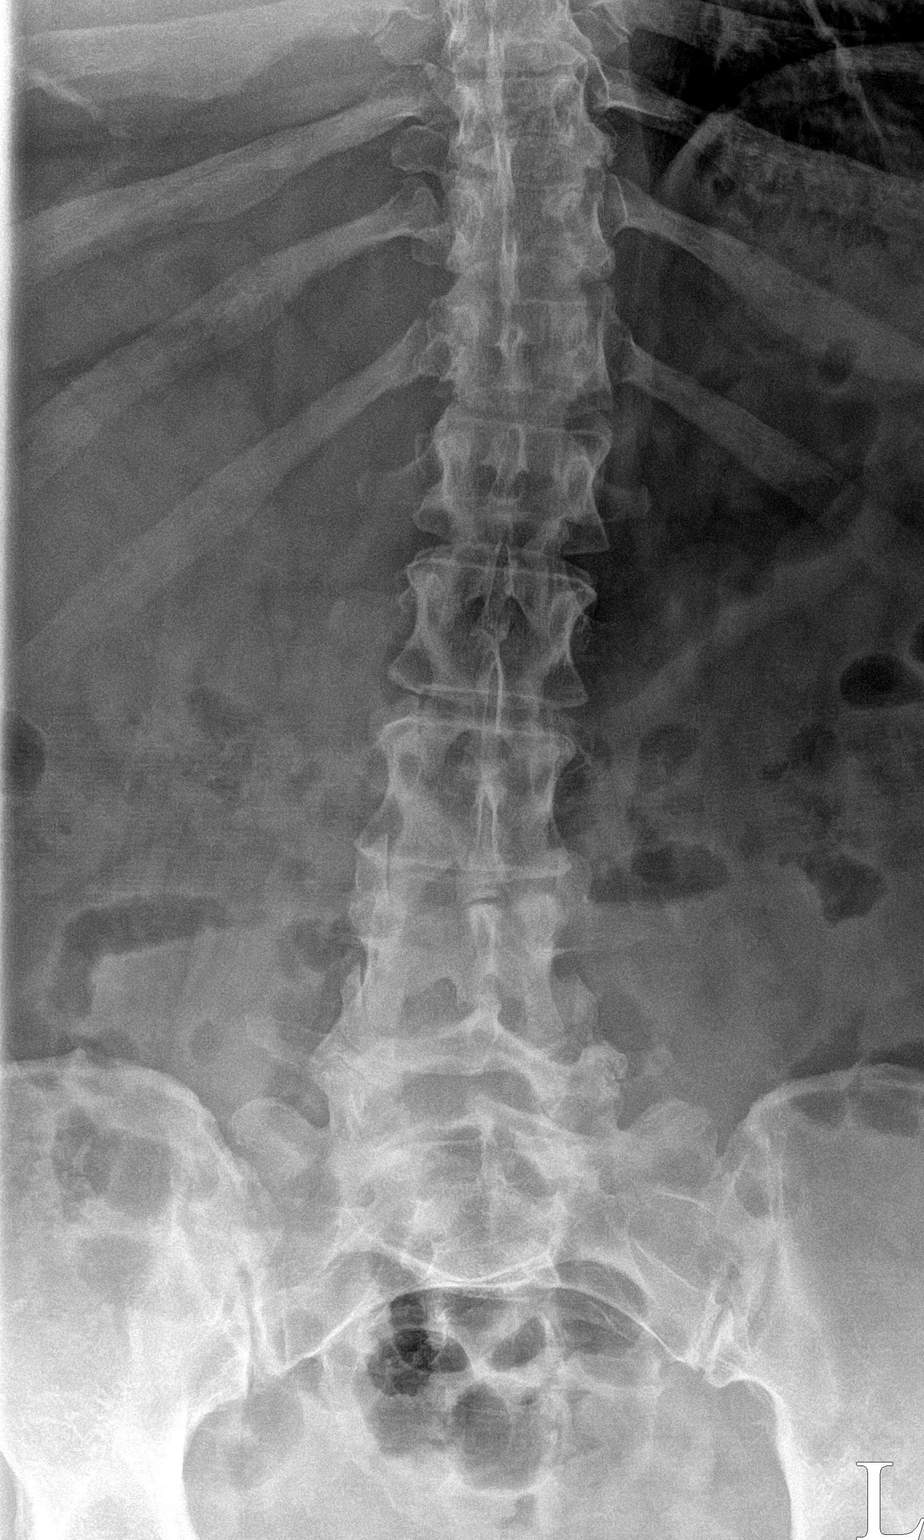

[l-spine lateral (1 of 2)]
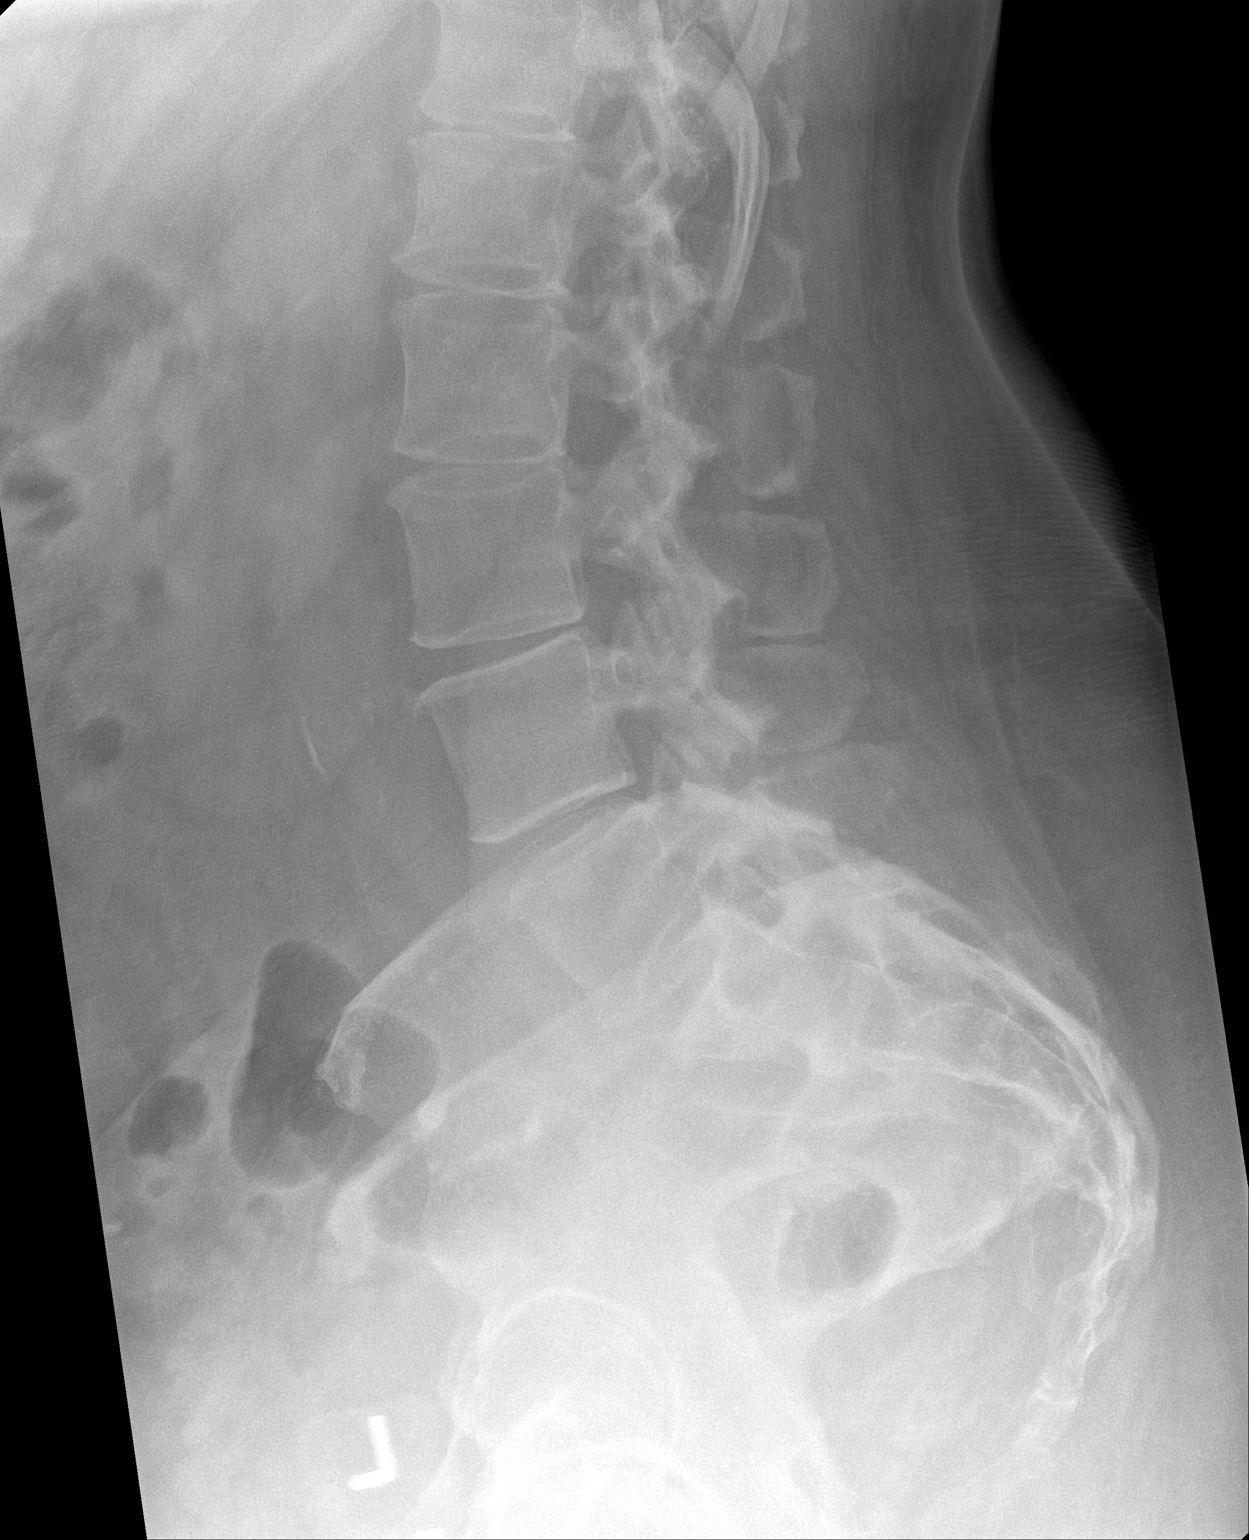

[l-spine lateral (2 of 2)]
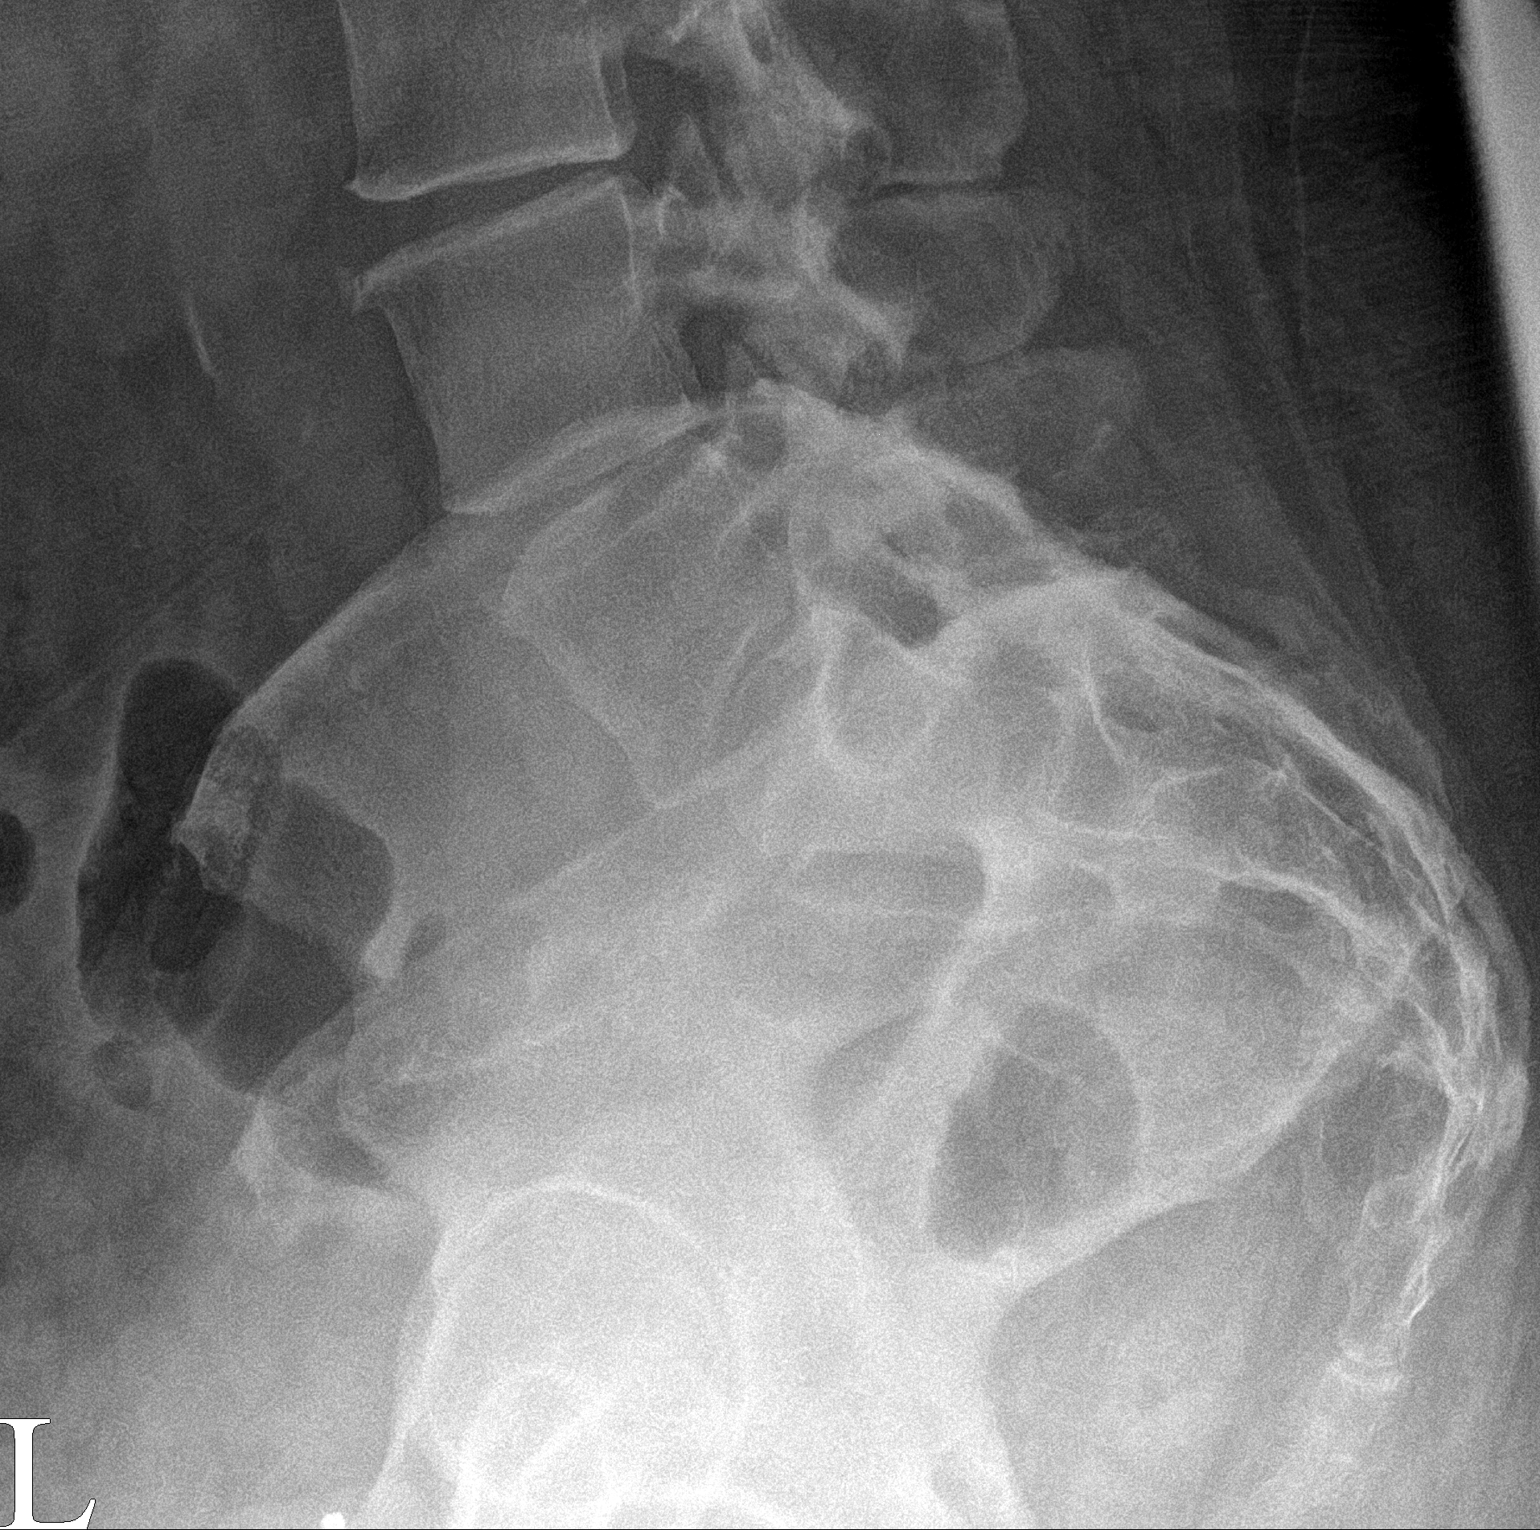

[3 of 3 positions shown; findings below may reference images not displayed]

FINDINGS: Frontal and lateral views of the lumbar spine are obtained. There
are 5 non-rib-bearing lumbar type vertebral bodies, with mild left
convex scoliosis centered at the thoracolumbar junction. No acute
displaced fractures. Mild diffuse spondylosis with disc space
narrowing and anterior osteophyte formation. There is mild facet
hypertrophy at L4-5 and L5-S1. The sacroiliac joints appear
unremarkable.
IMPRESSION: 1. Left convex scoliosis, with mild multilevel lumbar spondylosis
and facet hypertrophy.
2. No acute bony abnormalities.

## 2020-09-16 NOTE — Assessment & Plan Note (Signed)

## 2020-09-16 NOTE — Assessment & Plan Note (Signed)
Likely secondary to tightness of the hip flexors and the weakness of the hip abductors.  Patient is going to work on core strength.  Work with Product/process development scientist to learn home exercises in greater detail.  Patient will avoid oral anti-inflammatories because she was feeling like she had an ulcer.  We will try topical anti-inflammatories.  Has muscle relaxer for breakthrough pain.  Responded well to manipulation.  Follow-up with me again in 6 weeks

## 2020-09-16 NOTE — Patient Instructions (Signed)
Xray on way out Exercises 3x a week Vit D 2000IU daily  Pennsaid 2x a day fingertip sized amount Drop weight with workouts Ice after  See me in 6 weeks

## 2020-09-17 ENCOUNTER — Telehealth: Payer: Self-pay

## 2020-09-17 NOTE — Telephone Encounter (Signed)
  Follow up Call-  Call back number 09/15/2020  Post procedure Call Back phone  # [phone]  Permission to leave phone message Yes  Some recent data might be hidden      Patient questions:  Do you have a fever, pain , or abdominal swelling? No. Pain Score  0 *  Have you tolerated food without any problems? Yes.    Have you been able to return to your normal activities? Yes.    Do you have any questions about your discharge instructions: Diet   No. Medications  No. Follow up visit  No.  Do you have questions or concerns about your Care? No.  Actions: * If pain score is 4 or above: No action needed, pain <4.  1. Have you developed a fever since your procedure? no  2.   Have you had an respiratory symptoms (SOB or cough) since your procedure? no  3.   Have you tested positive for COVID 19 since your procedure no  4.   Have you had any family members/close contacts diagnosed with the COVID 19 since your procedure?  no   If yes to any of these questions please route to Joylene John, RN and Joella Prince, RN

## 2020-09-21 ENCOUNTER — Telehealth: Payer: Self-pay | Admitting: Gastroenterology

## 2020-09-21 ENCOUNTER — Other Ambulatory Visit: Payer: Self-pay

## 2020-09-21 DIAGNOSIS — K5900 Constipation, unspecified: Secondary | ICD-10-CM

## 2020-09-21 NOTE — Telephone Encounter (Signed)
Called patient back and answered her questions about Fibercon

## 2020-09-21 NOTE — Telephone Encounter (Signed)
Pt stated that she knows how to take Miralax but she has questions about Fibercon. How often is she to take it and should she take it on a empty stomach or with food.

## 2020-09-22 NOTE — Telephone Encounter (Signed)
Inbound call from patient, has additional questions.  Please advise.

## 2020-09-24 NOTE — Telephone Encounter (Signed)
Patient notified if she is still having constipation increase her Miralax to BID.  She will call back for any additional questions or concerns.

## 2020-09-26 ENCOUNTER — Other Ambulatory Visit: Payer: Self-pay | Admitting: Family Medicine

## 2020-09-28 ENCOUNTER — Telehealth: Payer: Self-pay | Admitting: Genetic Counselor

## 2020-09-28 NOTE — Telephone Encounter (Signed)
Scheduled appt per 6/7 referral. Pt aware.

## 2020-09-29 ENCOUNTER — Encounter: Payer: Self-pay | Admitting: Family Medicine

## 2020-09-29 DIAGNOSIS — G43809 Other migraine, not intractable, without status migrainosus: Secondary | ICD-10-CM

## 2020-09-30 ENCOUNTER — Ambulatory Visit
Admission: RE | Admit: 2020-09-30 | Discharge: 2020-09-30 | Disposition: A | Discharge status: Home / Self Care | Payer: 59 | Source: Ambulatory Visit | Attending: Obstetrics and Gynecology | Admitting: Obstetrics and Gynecology

## 2020-09-30 ENCOUNTER — Other Ambulatory Visit: Payer: Self-pay

## 2020-09-30 ENCOUNTER — Other Ambulatory Visit: Payer: Self-pay | Admitting: Obstetrics and Gynecology

## 2020-09-30 DIAGNOSIS — R928 Other abnormal and inconclusive findings on diagnostic imaging of breast: Secondary | ICD-10-CM

## 2020-09-30 DIAGNOSIS — N6310 Unspecified lump in the right breast, unspecified quadrant: Secondary | ICD-10-CM

## 2020-09-30 DIAGNOSIS — N6489 Other specified disorders of breast: Secondary | ICD-10-CM

## 2020-09-30 IMAGING — MG DIGITAL DIAGNOSTIC BILAT W/ TOMO W/ CAD
8 of 16 series · 8 of 40 positions shown · non-contrast
Comparison: Previous exam(s).

CLINICAL DATA: Nodular areas in the inferior right breast felt by
the patient in the region a post reduction scar. She had bilateral
breast reduction in early [SD].

EXAM:
DIGITAL DIAGNOSTIC BILATERAL MAMMOGRAM WITH TOMOSYNTHESIS AND CAD;
ULTRASOUND LEFT BREAST LIMITED; ULTRASOUND RIGHT BREAST LIMITED
TECHNIQUE: Bilateral digital diagnostic mammography and breast tomosynthesis
was performed. The images were evaluated with computer-aided
detection.; Targeted ultrasound examination of the left breast was
performed; Targeted ultrasound examination of the right breast was
performed

[L CC synth-2D (1 of 2)]
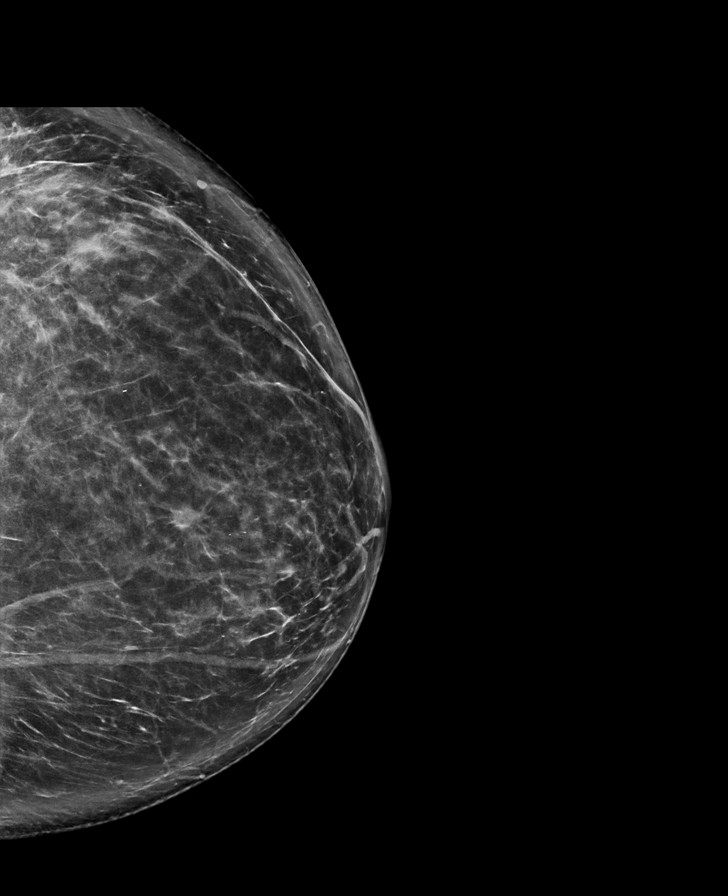

[L MLO synth-2D]
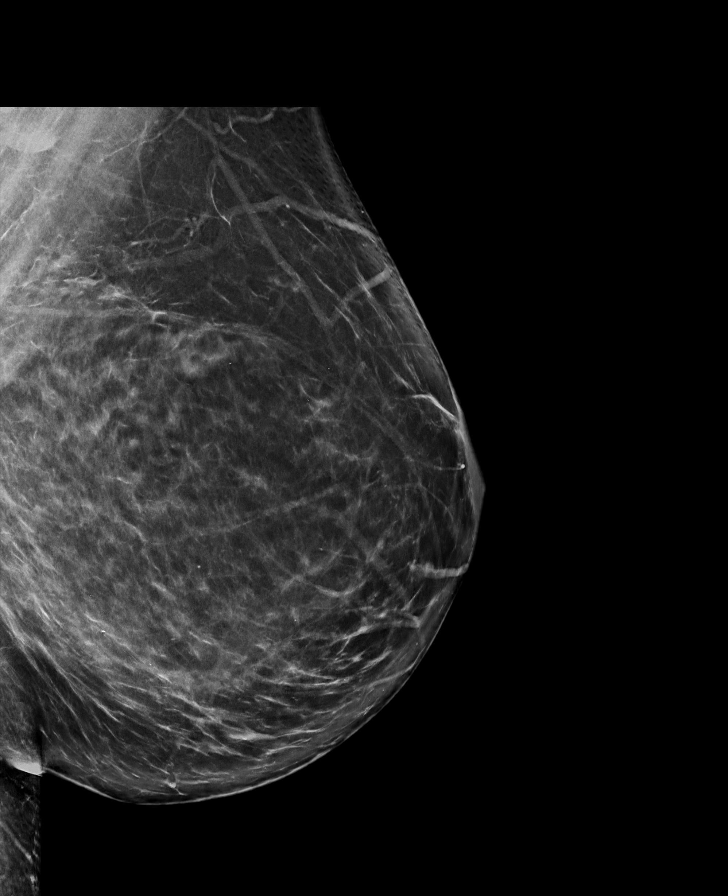

[R MLO synth-2D (1 of 2)]
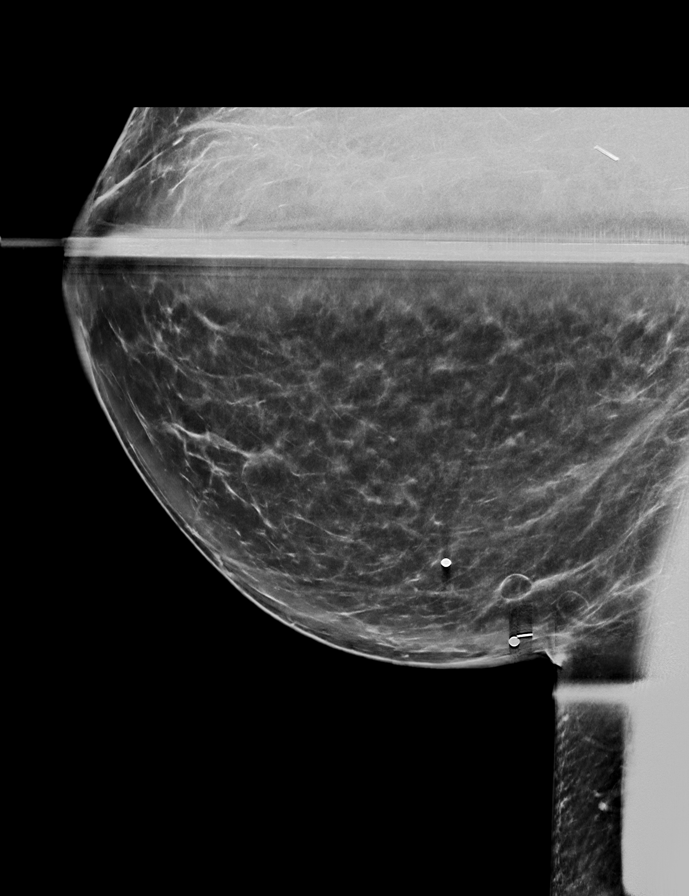

[L CC synth-2D (2 of 2)]
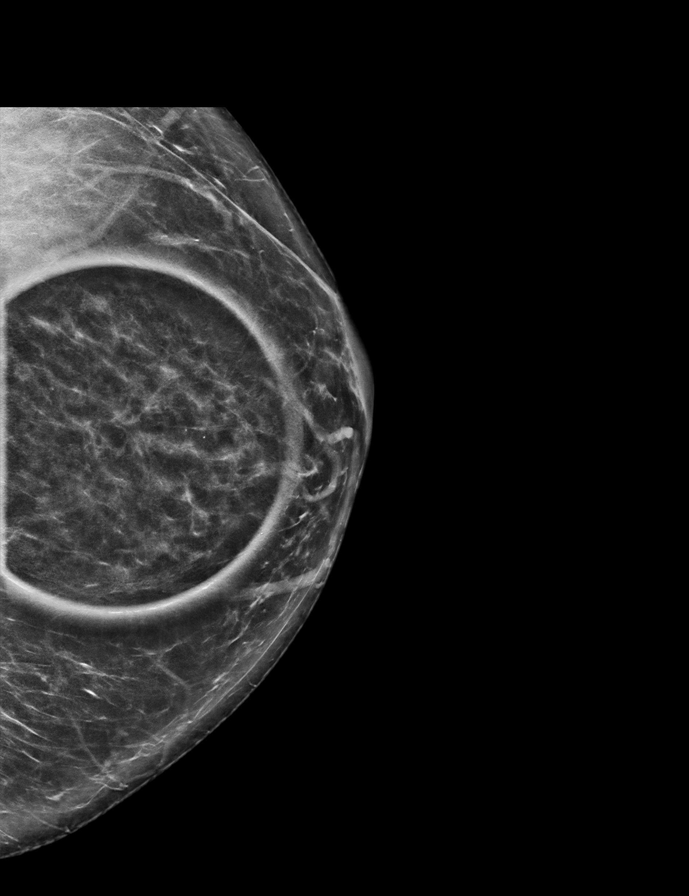

[R MLO synth-2D (2 of 2)]
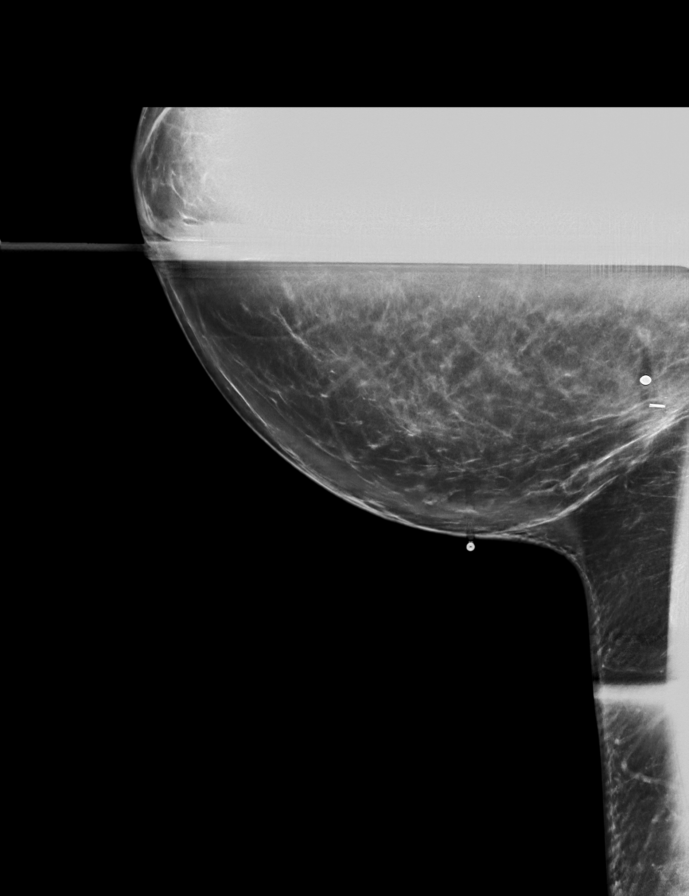

[L ML synth-2D]
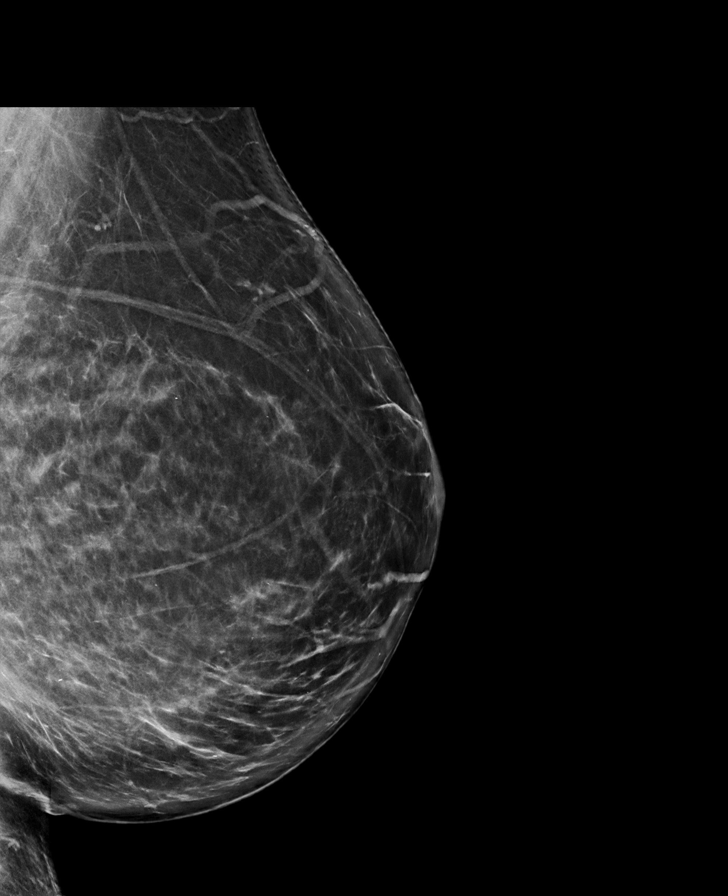

[R CC synth-2D]
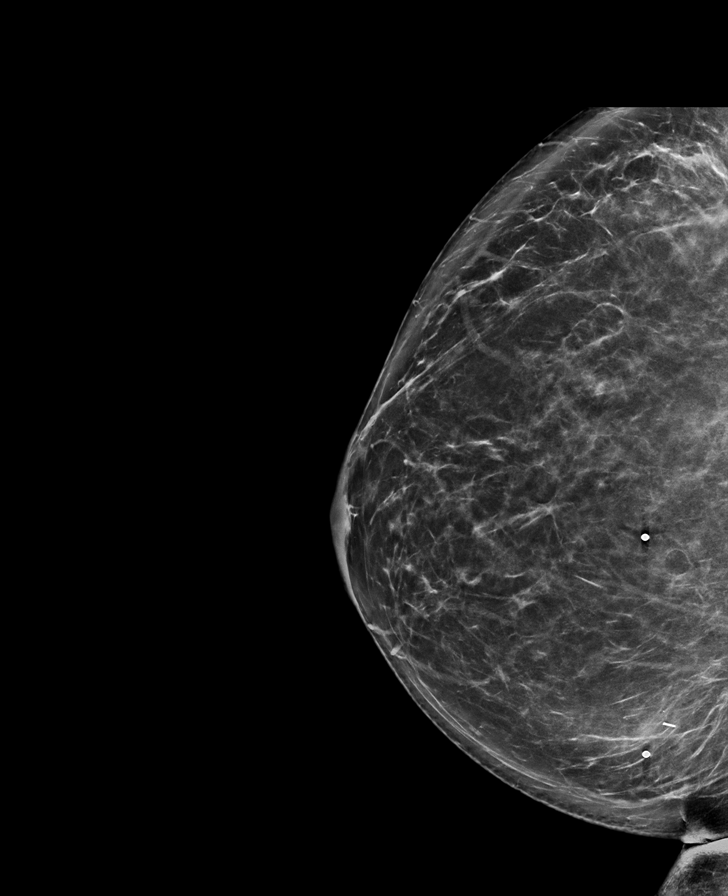

[L CC tomo · tomo slice 37/73.0]
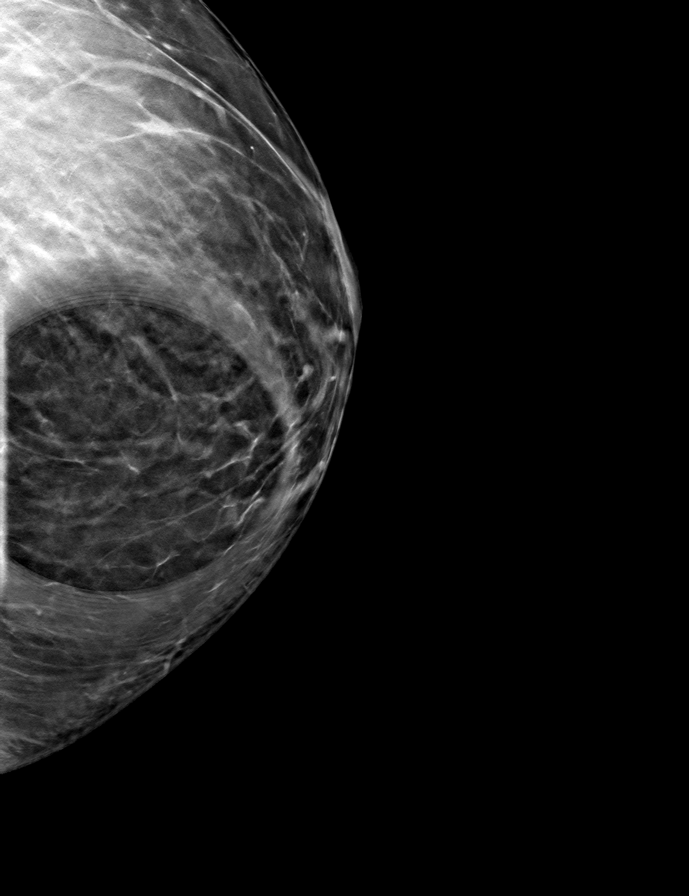

[8 of 40 positions shown; findings below may reference images not displayed]

ACR Breast Density Category b: There are scattered areas of
fibroglandular density.
FINDINGS: Interval bilateral post reduction changes. Is interval multiple
small oil cysts and areas of ill-defined increased density in the
inferior right breast in the regions of patient palpable concern.
Interval focal architectural distortion in the 12 o'clock position
of the left breast.

On physical exam, the patient has multiple small, rounded,
circumscribed palpable masses in the lower inner right breast in the
region of her reduction scar. No mass is palpable in the 12 o'clock
left breast. There is a short post reduction scar extending
vertically above the left nipple in the 12 o'clock position.

Targeted ultrasound is performed, showing multiple oil cysts and
small echogenic areas of fat necrosis in the inferior right breast
in the areas of palpable concern in the region of the post reduction
scar.

There is also postsurgical scar in the 12 o'clock periareolar left
breast at the location of the recently demonstrated focal
distortion.
IMPRESSION: 1. Focal distortion in the 12 o'clock position of the left breast
likely representing post reduction scarring seen at ultrasound.
2. Multiple areas of fat necrosis and oil cyst formation in the
inferior right breast in the region of the post reduction scar.

RECOMMENDATION:
Left diagnostic mammogram and possible left breast ultrasound in 6
months.

I have discussed the findings and recommendations with the patient.
If applicable, a reminder letter will be sent to the patient
regarding the next appointment.

BI-RADS CATEGORY  3: Probably benign.

## 2020-09-30 IMAGING — US US BREAST*R* LIMITED INC AXILLA
1 series · 13 of 15 positions shown · non-contrast
Comparison: Previous exam(s).

CLINICAL DATA: Nodular areas in the inferior right breast felt by
the patient in the region a post reduction scar. She had bilateral
breast reduction in early [SD].

EXAM:
DIGITAL DIAGNOSTIC BILATERAL MAMMOGRAM WITH TOMOSYNTHESIS AND CAD;
ULTRASOUND LEFT BREAST LIMITED; ULTRASOUND RIGHT BREAST LIMITED
TECHNIQUE: Bilateral digital diagnostic mammography and breast tomosynthesis
was performed. The images were evaluated with computer-aided
detection.; Targeted ultrasound examination of the left breast was
performed; Targeted ultrasound examination of the right breast was
performed

[Series 1: us breast*right* limited inc axilla · 0.06mm/px · 13 of 15 slices shown]
[im 1/15]
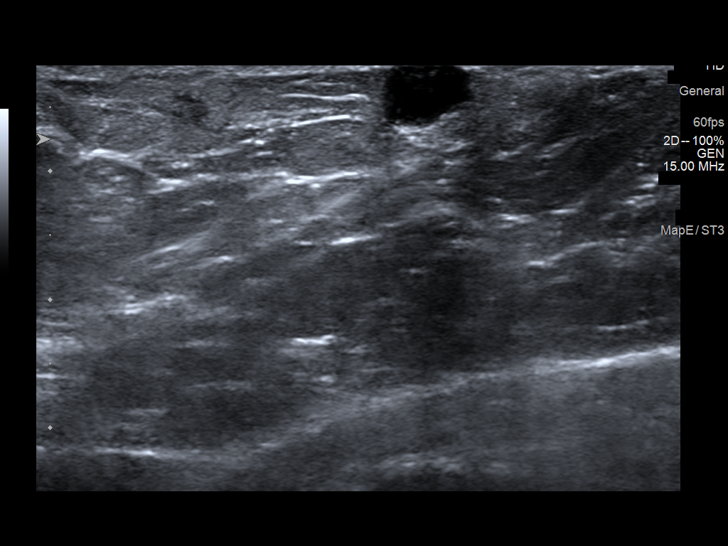
[im 2/15]
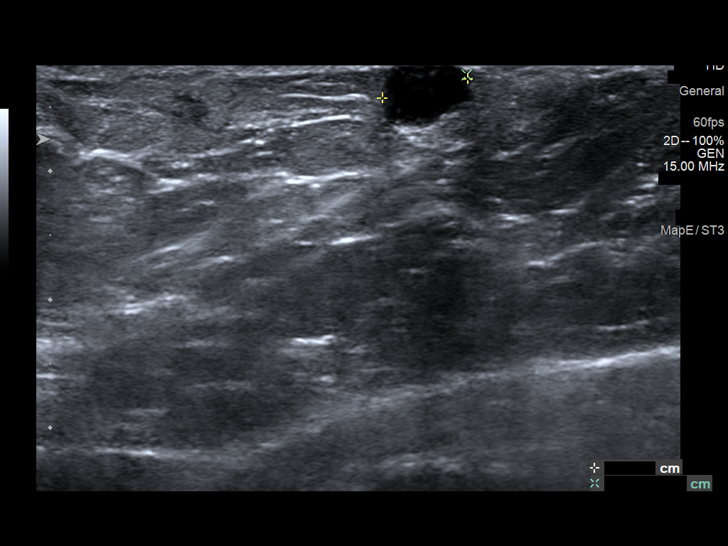
[im 3/15]
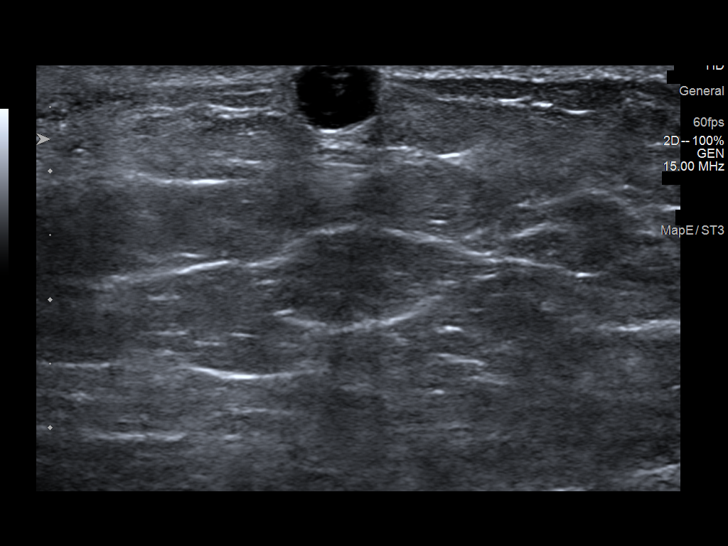
[im 5/15]
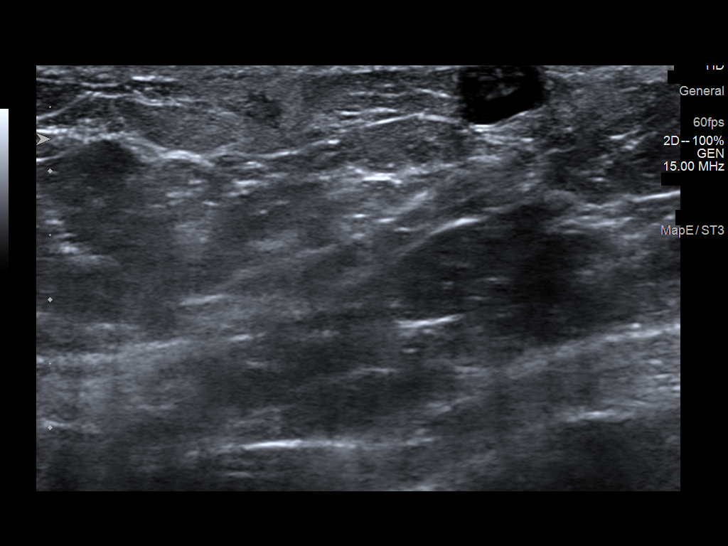
[im 6/15]
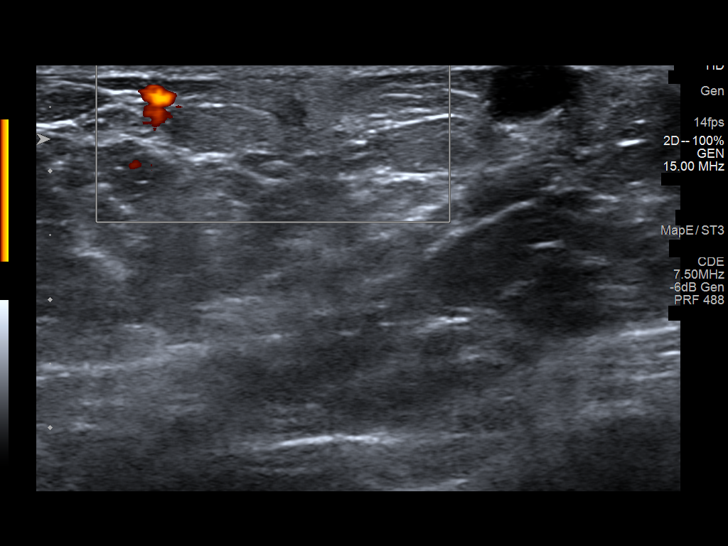
[im 7/15]
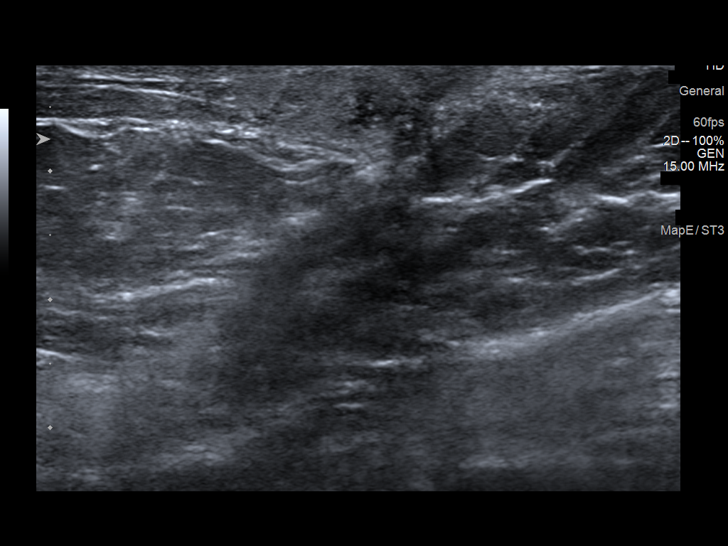
[im 8/15]
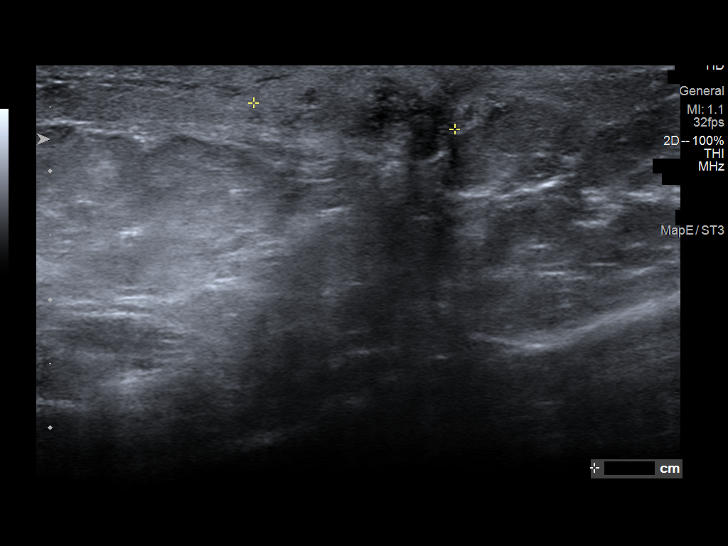
[im 9/15]
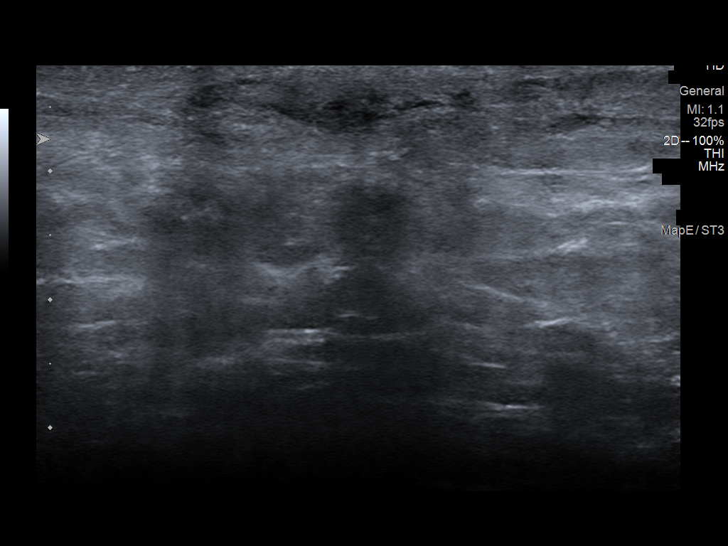
[im 10/15]
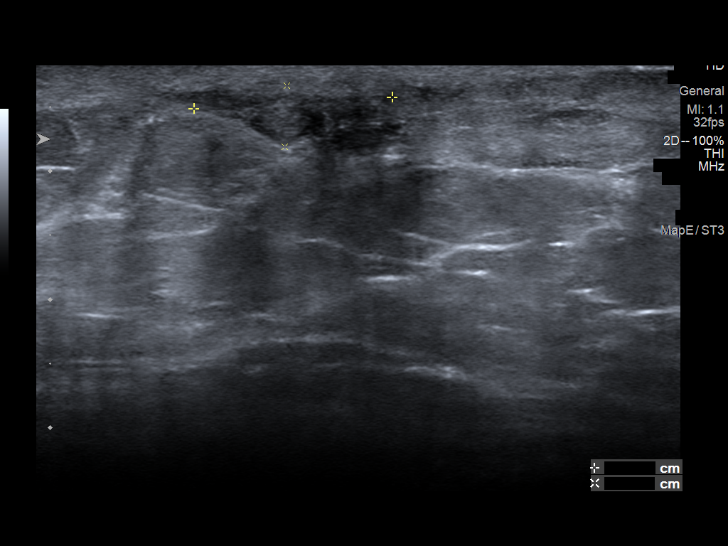
[im 11/15]
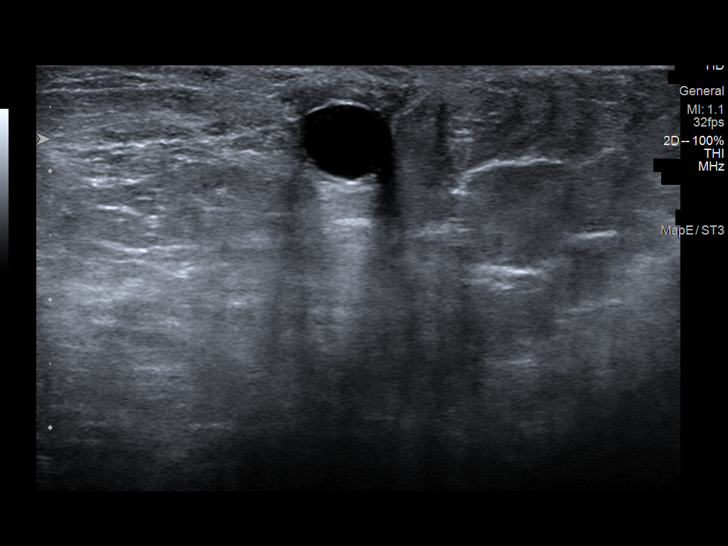
[im 13/15]
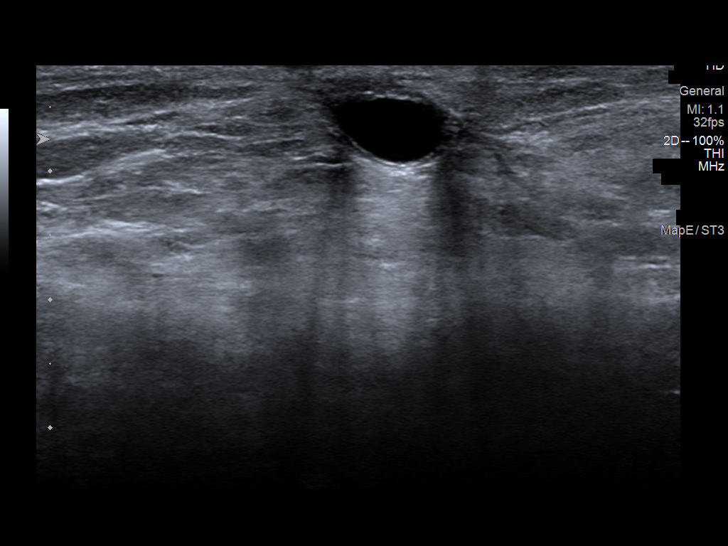
[im 14/15]
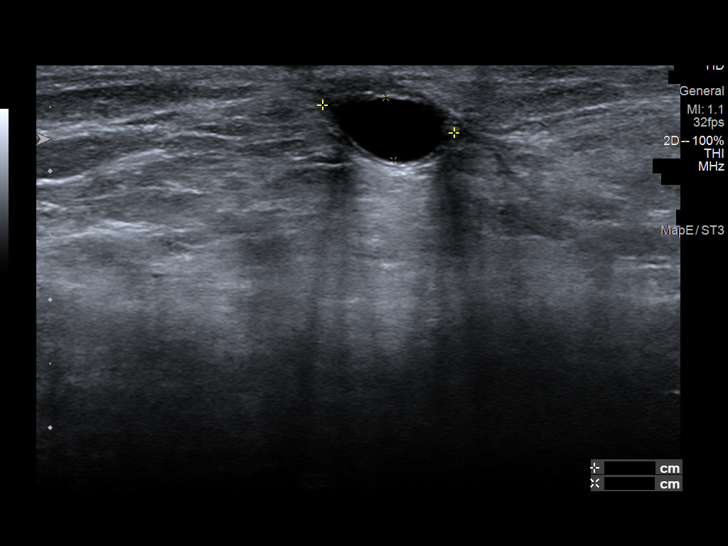
[im 15/15]
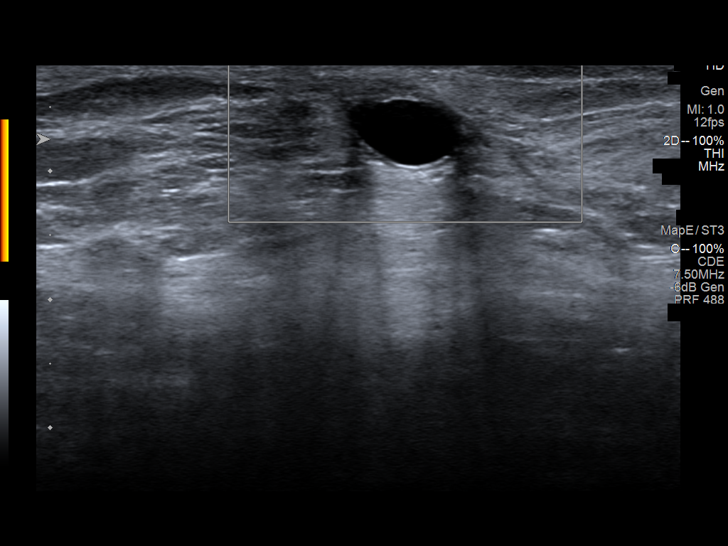

[13 of 15 positions shown; findings below may reference images not displayed]

ACR Breast Density Category b: There are scattered areas of
fibroglandular density.
FINDINGS: Interval bilateral post reduction changes. Is interval multiple
small oil cysts and areas of ill-defined increased density in the
inferior right breast in the regions of patient palpable concern.
Interval focal architectural distortion in the 12 o'clock position
of the left breast.

On physical exam, the patient has multiple small, rounded,
circumscribed palpable masses in the lower inner right breast in the
region of her reduction scar. No mass is palpable in the 12 o'clock
left breast. There is a short post reduction scar extending
vertically above the left nipple in the 12 o'clock position.

Targeted ultrasound is performed, showing multiple oil cysts and
small echogenic areas of fat necrosis in the inferior right breast
in the areas of palpable concern in the region of the post reduction
scar.

There is also postsurgical scar in the 12 o'clock periareolar left
breast at the location of the recently demonstrated focal
distortion.
IMPRESSION: 1. Focal distortion in the 12 o'clock position of the left breast
likely representing post reduction scarring seen at ultrasound.
2. Multiple areas of fat necrosis and oil cyst formation in the
inferior right breast in the region of the post reduction scar.

RECOMMENDATION:
Left diagnostic mammogram and possible left breast ultrasound in 6
months.

I have discussed the findings and recommendations with the patient.
If applicable, a reminder letter will be sent to the patient
regarding the next appointment.

BI-RADS CATEGORY  3: Probably benign.

## 2020-09-30 IMAGING — US US BREAST*L* LIMITED INC AXILLA
1 series · 7 of 7 positions shown · non-contrast
Comparison: Previous exam(s).

CLINICAL DATA: Nodular areas in the inferior right breast felt by
the patient in the region a post reduction scar. She had bilateral
breast reduction in early [SD].

EXAM:
DIGITAL DIAGNOSTIC BILATERAL MAMMOGRAM WITH TOMOSYNTHESIS AND CAD;
ULTRASOUND LEFT BREAST LIMITED; ULTRASOUND RIGHT BREAST LIMITED
TECHNIQUE: Bilateral digital diagnostic mammography and breast tomosynthesis
was performed. The images were evaluated with computer-aided
detection.; Targeted ultrasound examination of the left breast was
performed; Targeted ultrasound examination of the right breast was
performed

[Series 1: us breast*left* limited inc axilla · 0.07mm/px · 7 of 7 slices shown]
[im 1/7]
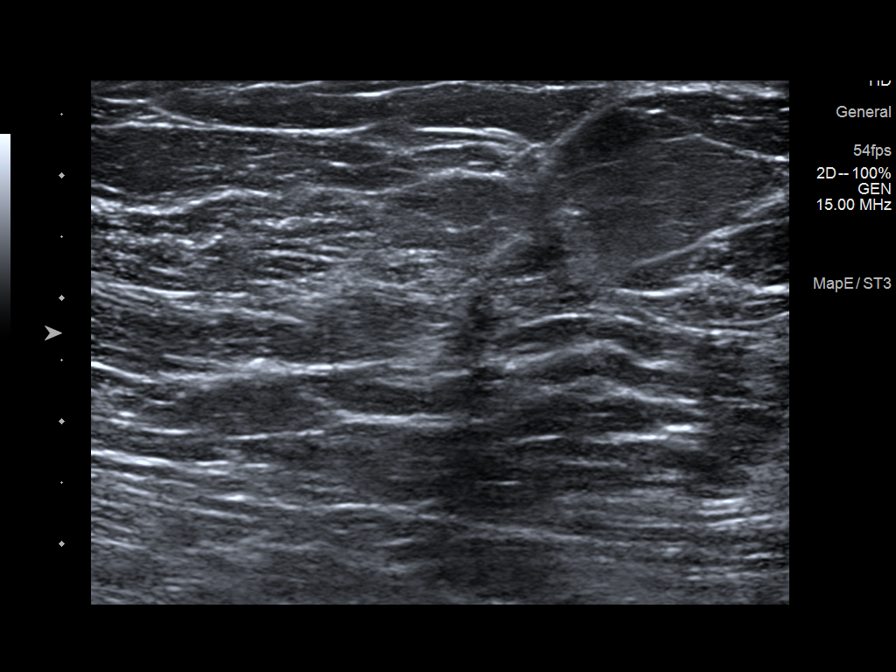
[im 2/7]
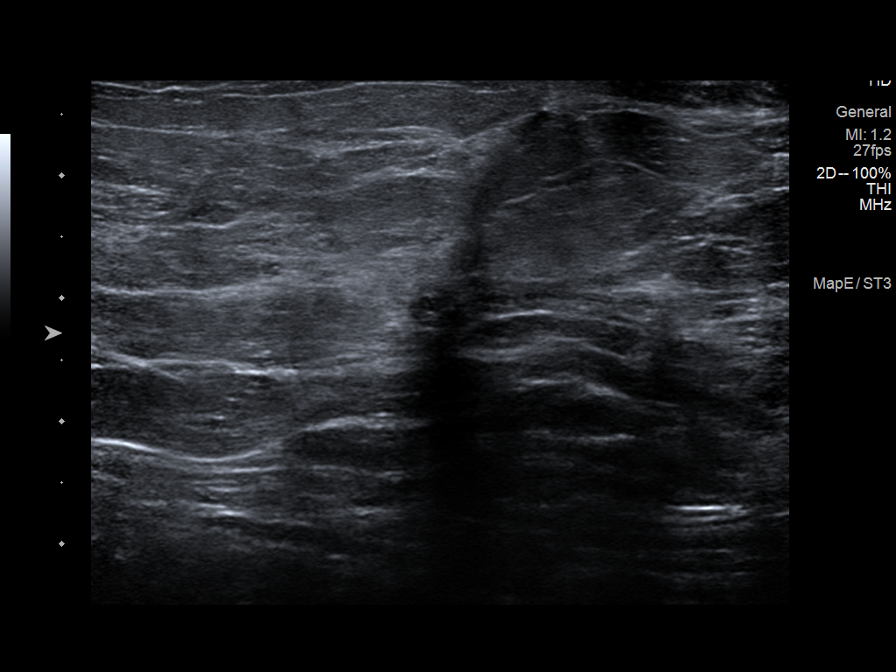
[im 3/7]
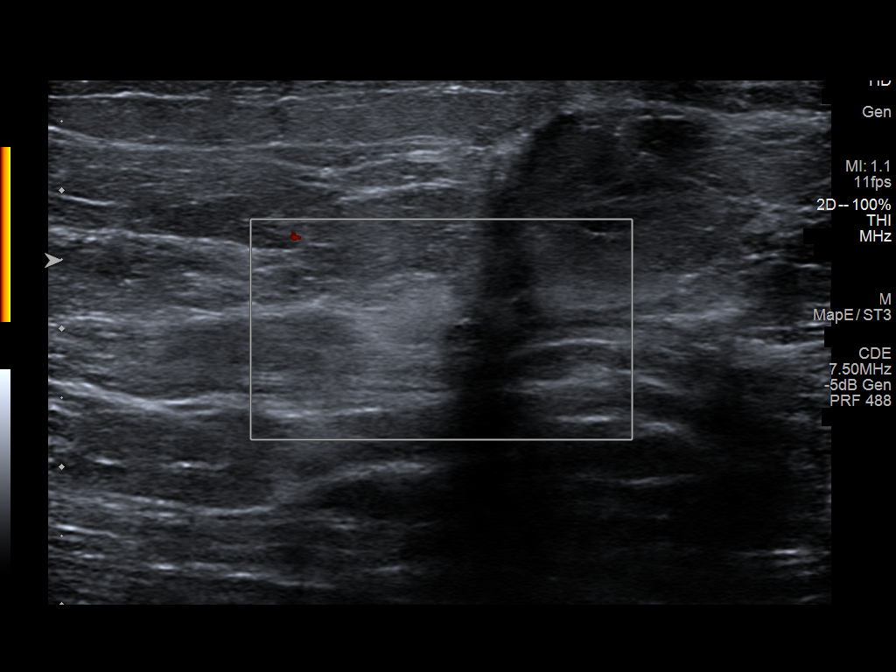
[im 4/7]
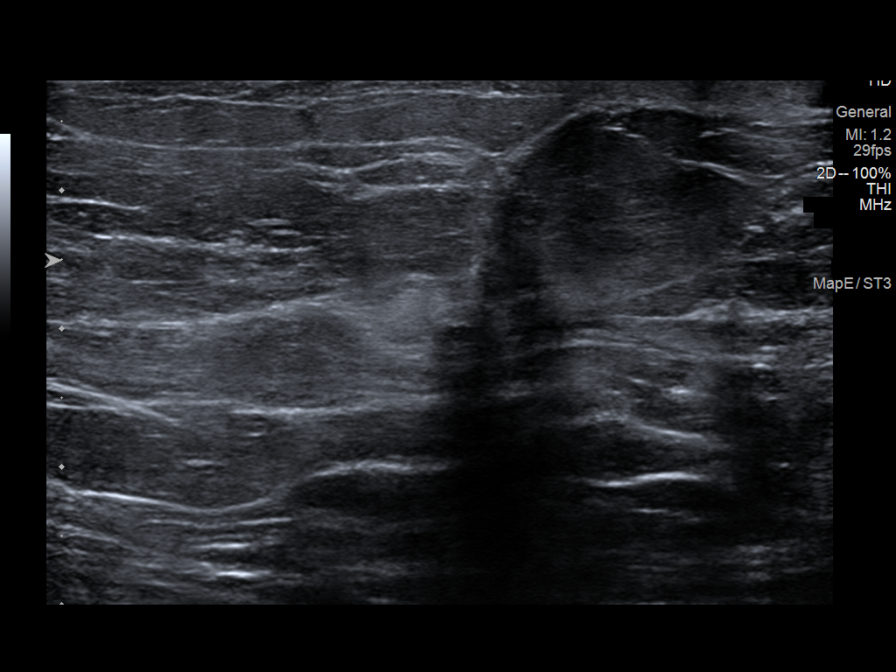
[im 5/7]
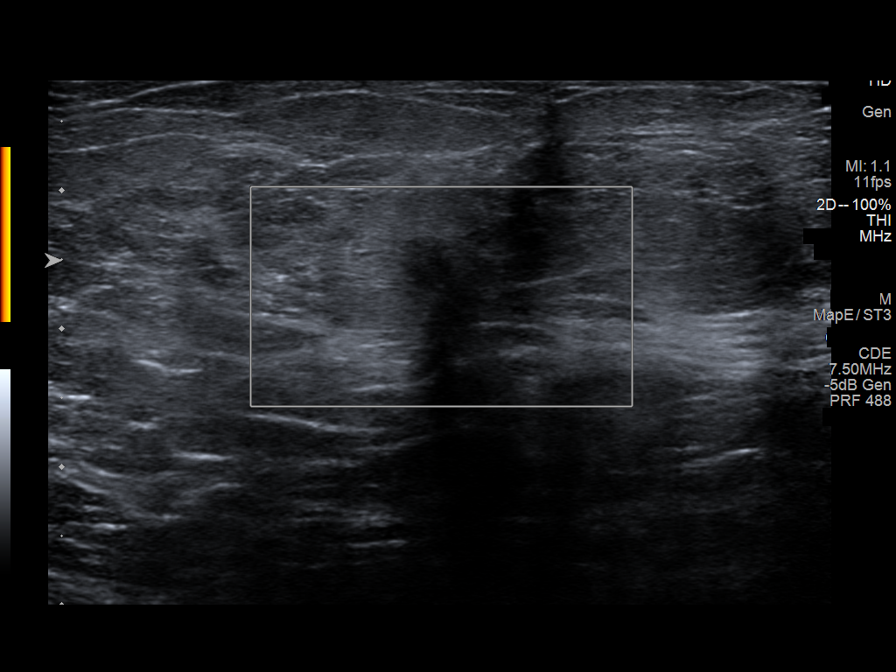
[im 6/7]
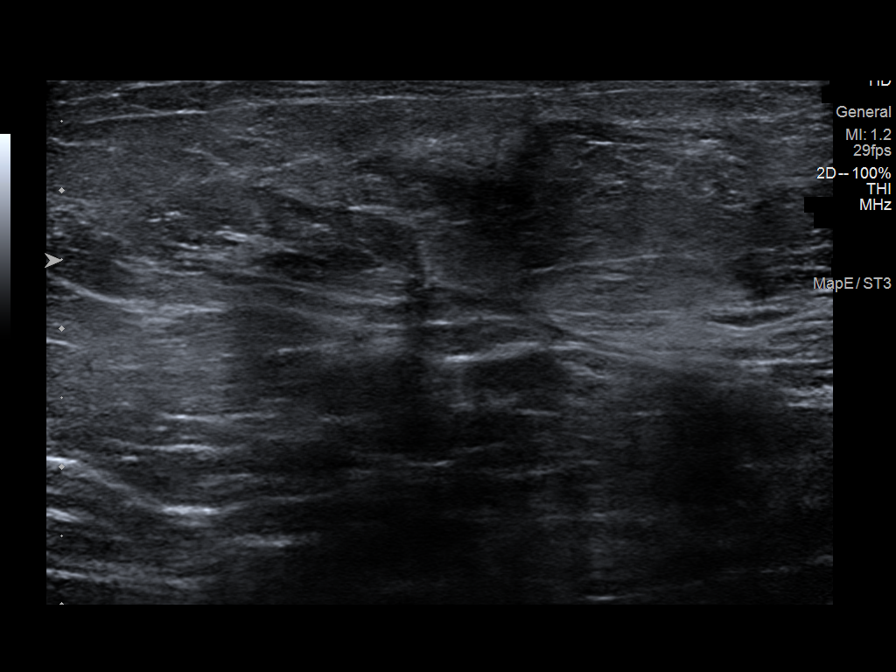
[im 7/7]
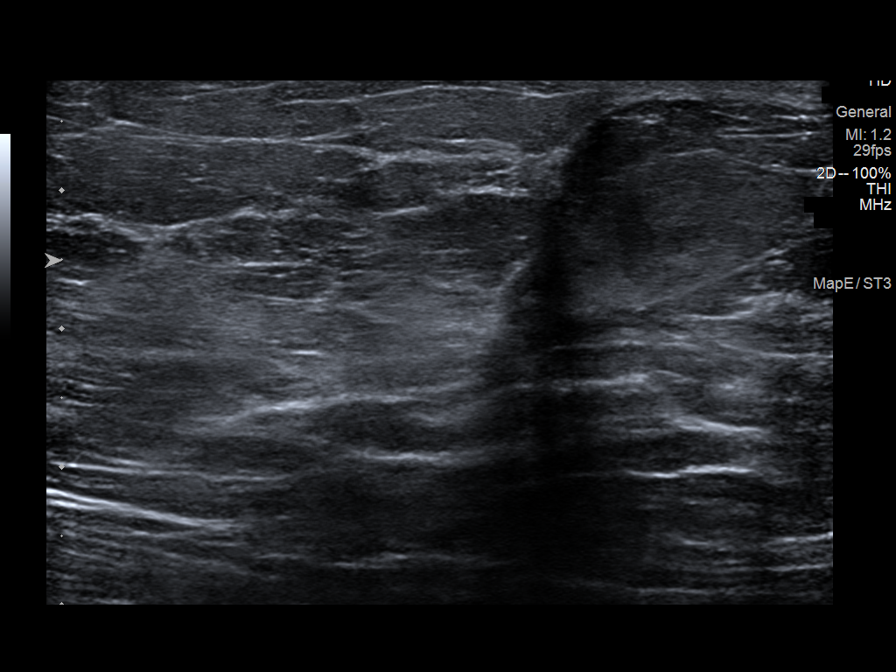

[7 of 7 positions shown; findings below may reference images not displayed]

ACR Breast Density Category b: There are scattered areas of
fibroglandular density.
FINDINGS: Interval bilateral post reduction changes. Is interval multiple
small oil cysts and areas of ill-defined increased density in the
inferior right breast in the regions of patient palpable concern.
Interval focal architectural distortion in the 12 o'clock position
of the left breast.

On physical exam, the patient has multiple small, rounded,
circumscribed palpable masses in the lower inner right breast in the
region of her reduction scar. No mass is palpable in the 12 o'clock
left breast. There is a short post reduction scar extending
vertically above the left nipple in the 12 o'clock position.

Targeted ultrasound is performed, showing multiple oil cysts and
small echogenic areas of fat necrosis in the inferior right breast
in the areas of palpable concern in the region of the post reduction
scar.

There is also postsurgical scar in the 12 o'clock periareolar left
breast at the location of the recently demonstrated focal
distortion.
IMPRESSION: 1. Focal distortion in the 12 o'clock position of the left breast
likely representing post reduction scarring seen at ultrasound.
2. Multiple areas of fat necrosis and oil cyst formation in the
inferior right breast in the region of the post reduction scar.

RECOMMENDATION:
Left diagnostic mammogram and possible left breast ultrasound in 6
months.

I have discussed the findings and recommendations with the patient.
If applicable, a reminder letter will be sent to the patient
regarding the next appointment.

BI-RADS CATEGORY  3: Probably benign.

## 2020-10-07 ENCOUNTER — Inpatient Hospital Stay: Payer: 59

## 2020-10-07 ENCOUNTER — Inpatient Hospital Stay: Payer: 59 | Admitting: Genetic Counselor

## 2020-10-07 NOTE — Progress Notes (Signed)
[facility] [place] Phone: [phone] Subjective:   Alyssa Clay, am serving as a scribe for Dr. Hulan Saas.  I'm seeing this patient by the request  of:  Koberlein, Steele Berg, MD  CC: Low back pain follow-up  [id number]  Alyssa Clay is a 55 y.o. female coming in with complaint of back and neck pain. OMT 09/16/2020.  Patient was to do home exercises and icing regimen.  Patient was to be continuing using meloxicam.  Patient states that her back is the same as last visit. Patient went to the gym and everything she tried she was in pain. Patient has not been taking the meloxicam because she has been constipated and wasn't sure what was causing. Patient has been doing the exercises and trying to walk to keep moving.   Medications patient has been prescribed: None  Taking: none     Previous imaging included a CT scan of the abdomen and pelvis that showed a very slightly enlarged ovary that is being followed by gynecologist.  Patient did have lumbar x-rays at last exam that were independently visualized by me today.  Patient does have mild scoliosis noted of the lumbar spine but does have mild spondylosis at multiple levels of the lumbar spine withFacet hypertrophy.    Reviewed prior external information including notes and imaging from previsou exam, outside providers and external EMR if available. As stated above  As well as notes that were available from care everywhere and other healthcare systems.  Past medical history, social, surgical and family history all reviewed in electronic medical record.  No pertanent information unless stated regarding to the chief complaint.   Past Medical History:  Diagnosis Date   Allergy    Anemia    Asthma    Constipation    issues x the past month -? 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 POSITIVE muscle aches, Body aches  Objective  Blood pressure 140/80, pulse 95, height 5\' 5"  (1.651 m), weight 253 lb (114.8 kg), last menstrual period 12/20/2018, SpO2 96 %.   General: No apparent distress alert and oriented x3 mood and affect normal, dressed appropriately.  HEENT: Pupils equal, extraocular movements intact  Respiratory: Patient's speak in full sentences and does not appear short of breath  Cardiovascular: No lower extremity edema, non tender, no erythema  Gait normal with good balance and coordination.  MSK:  Non tender with full range of motion and good stability and symmetric strength and tone of shoulders, elbows, wrist, hip, knee and ankles bilaterally.  Back -low back exam does have some mild loss of lordosis.  Tender to palpation over the sacroiliac joint bilaterally.  Positive FABER test noted and patient does have positive straight leg test noted today on the right side and 20 degrees of forward flexion in the L4 and L5 correspondence.  No significant weakness over the lower extremity noted       Assessment and Plan:  Lumbar radiculopathy, right Patient states continues to have more of a lumbar radiculopathy.  Patient is having worsening pain and feels that meloxicam did not help.  Patient is more radicular symptoms as well.  Patient is doing a lot of conservative therapy for over a year for this back and has not made significant progress.  Attempted osteopathic manipulation as well as well as medications such  as Flexeril and duloxetine.  Do feel that advanced imaging is At this time.  Patient has been very motivated and has lost weight but is unfortunately unable to lose any more weight secondary to the pain.  Unable to even pick up her dog and sometimes has difficulty with stairs.  Depending on imaging patient could be a candidate for possible epidurals.  Patient will follow up with me after imaging to discuss further.        The above documentation has been reviewed and is accurate and complete Alyssa Pulley, DO        Note: This dictation was prepared with Dragon dictation along with smaller phrase technology. Any transcriptional errors that result from this process are unintentional.

## 2020-10-08 ENCOUNTER — Ambulatory Visit (INDEPENDENT_AMBULATORY_CARE_PROVIDER_SITE_OTHER): Payer: 59 | Admitting: Family Medicine

## 2020-10-08 ENCOUNTER — Other Ambulatory Visit: Payer: Self-pay

## 2020-10-08 ENCOUNTER — Encounter: Payer: Self-pay | Admitting: Family Medicine

## 2020-10-08 DIAGNOSIS — M5416 Radiculopathy, lumbar region: Secondary | ICD-10-CM | POA: Diagnosis not present

## 2020-10-08 NOTE — Patient Instructions (Signed)
[facility] [phone] Will write to you in MyChart with results

## 2020-10-08 NOTE — Assessment & Plan Note (Signed)
Patient states continues to have more of a lumbar radiculopathy.  Patient is having worsening pain and feels that meloxicam did not help.  Patient is more radicular symptoms as well.  Patient is doing a lot of conservative therapy for over a year for this back and has not made significant progress.  Attempted osteopathic manipulation as well as well as medications such as Flexeril and duloxetine.  Do feel that advanced imaging is At this time.  Patient has been very motivated and has lost weight but is unfortunately unable to lose any more weight secondary to the pain.  Unable to even pick up her dog and sometimes has difficulty with stairs.  Depending on imaging patient could be a candidate for possible epidurals.  Patient will follow up with me after imaging to discuss further.

## 2020-10-10 ENCOUNTER — Ambulatory Visit (INDEPENDENT_AMBULATORY_CARE_PROVIDER_SITE_OTHER): Payer: 59

## 2020-10-10 ENCOUNTER — Other Ambulatory Visit: Payer: Self-pay

## 2020-10-10 DIAGNOSIS — M5416 Radiculopathy, lumbar region: Secondary | ICD-10-CM | POA: Diagnosis not present

## 2020-10-10 DIAGNOSIS — M25552 Pain in left hip: Secondary | ICD-10-CM | POA: Diagnosis not present

## 2020-10-10 DIAGNOSIS — M545 Low back pain, unspecified: Secondary | ICD-10-CM | POA: Diagnosis not present

## 2020-10-10 IMAGING — MR MR LUMBAR SPINE W/O CM
4 of 5 series · 25 of 48 positions shown · non-contrast
Comparison: Lumbar spine radiographs [DATE]

CLINICAL DATA: Low back and left hip pain following lifting injury
6 weeks ago. No previous relevant surgery.

EXAM:
MRI LUMBAR SPINE WITHOUT CONTRAST
TECHNIQUE: Multiplanar, multisequence MR imaging of the lumbar spine was
performed. No intravenous contrast was administered.

[Series 2: T2 · sagittal · 4.0mm · 0.81mm/px · 6 of 15 slices shown (1 of 2)]
[im 1/15]
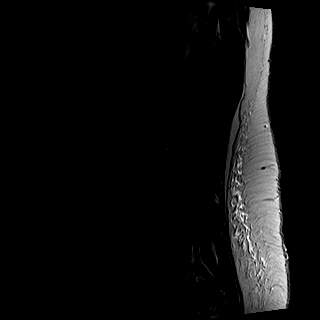
[im 3/15]
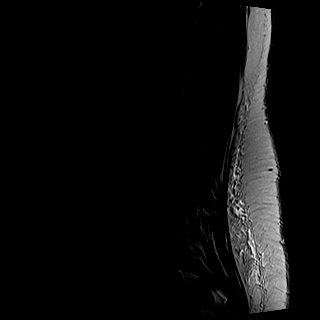
[im 6/15]
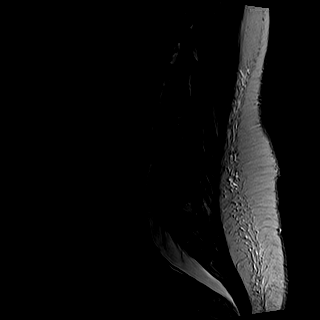
[im 9/15]
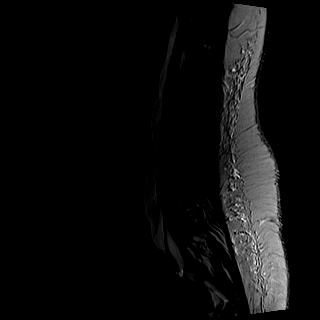
[im 12/15]
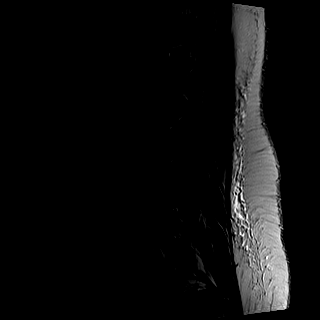
[im 15/15]
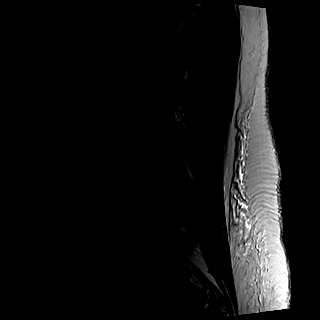

[Series 3: T1 · sagittal · 4.0mm · 0.41mm/px · 6 of 15 slices shown (1 of 2)]
[im 1/15]
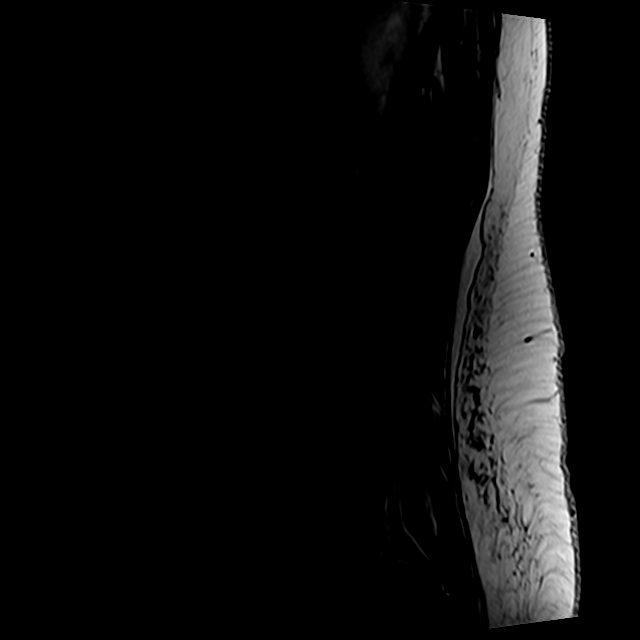
[im 3/15]
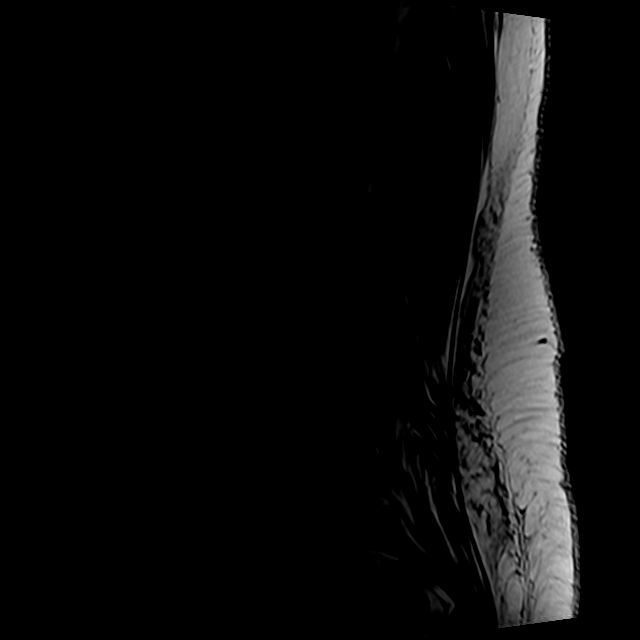
[im 6/15]
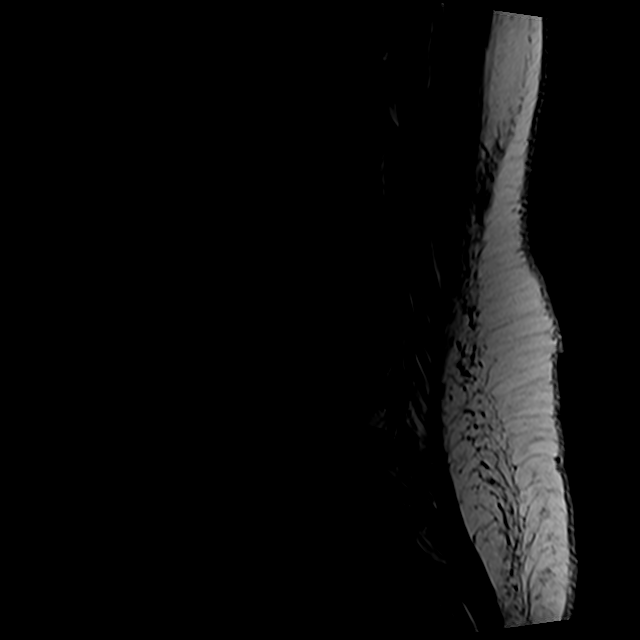
[im 9/15]
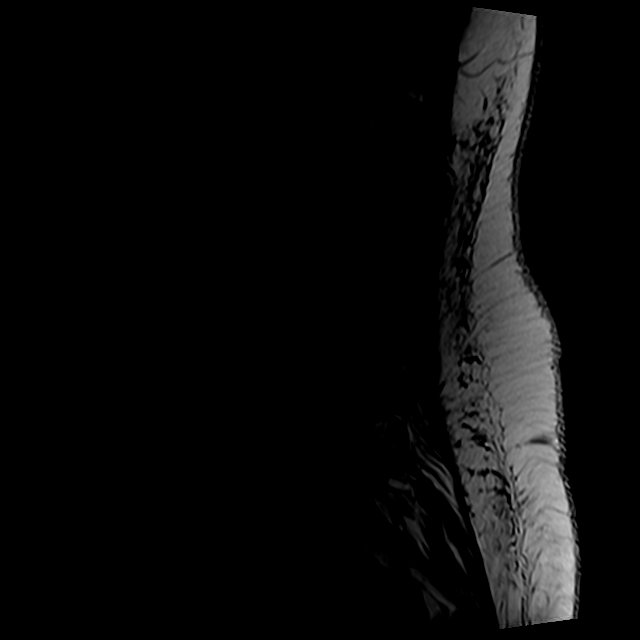
[im 12/15]
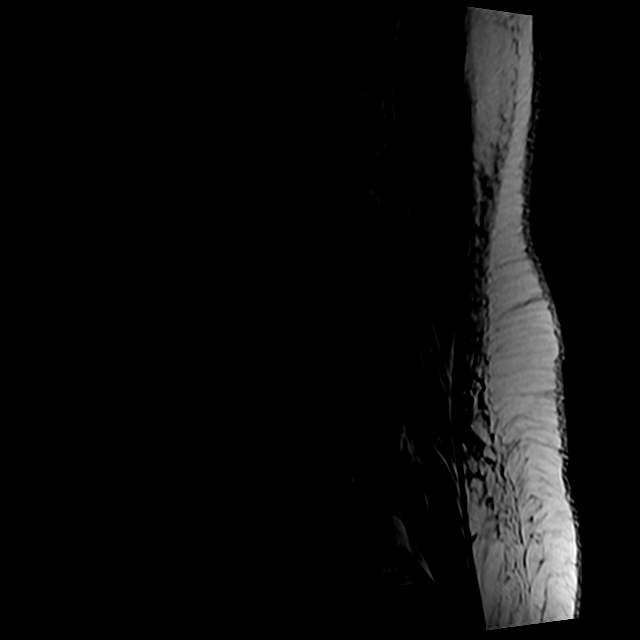
[im 15/15]
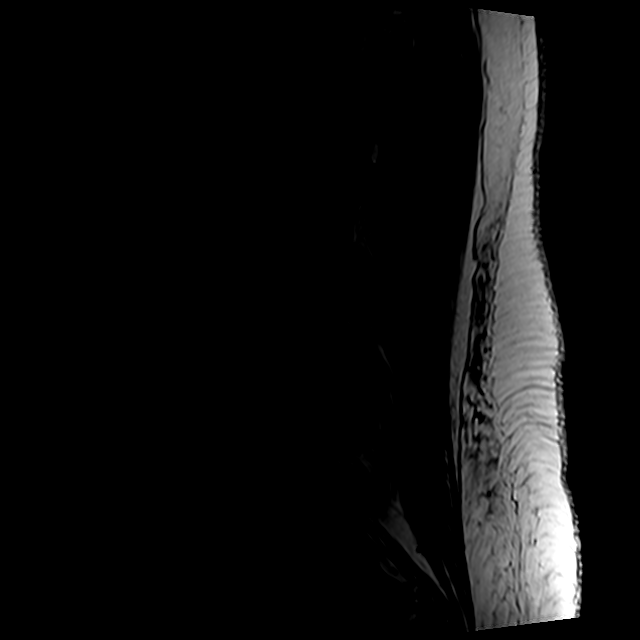

[Series 5: T2 · axial · 4.0mm · 0.78mm/px · z∈[-118,+114]mm · 9 of 41 slices shown (2 of 2)]
[im 1/41]
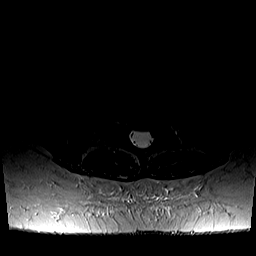
[im 6/41]
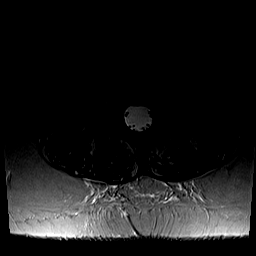
[im 12/41]
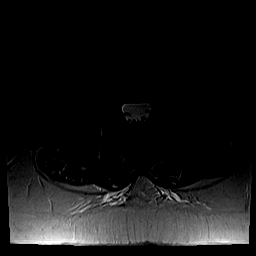
[im 18/41]
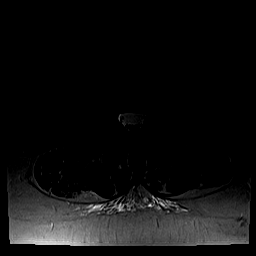
[im 21/41]
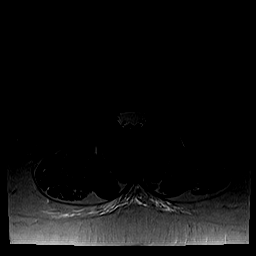
[im 23/41]
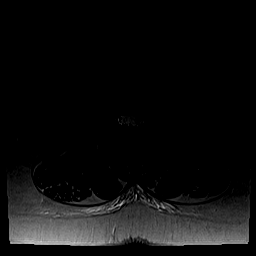
[im 29/41]
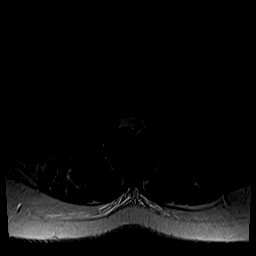
[im 35/41]
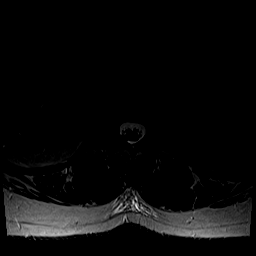
[im 41/41]
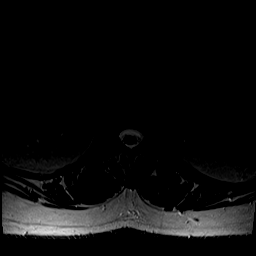

[Series 6: T1 · axial · 4.0mm · 0.39mm/px · z∈[-118,+84]mm · 4 of 41 slices shown (2 of 2)]
[im 1/41]
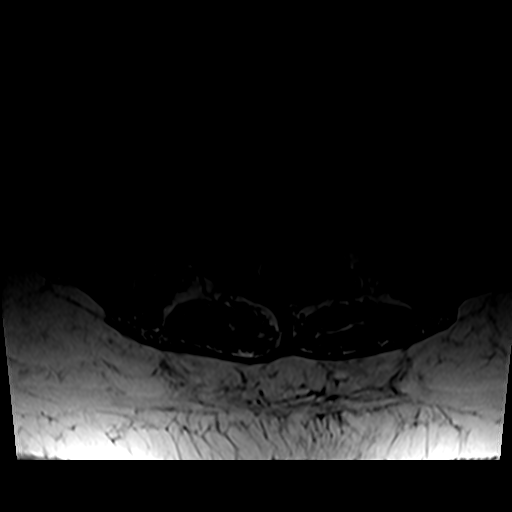
[im 6/41]
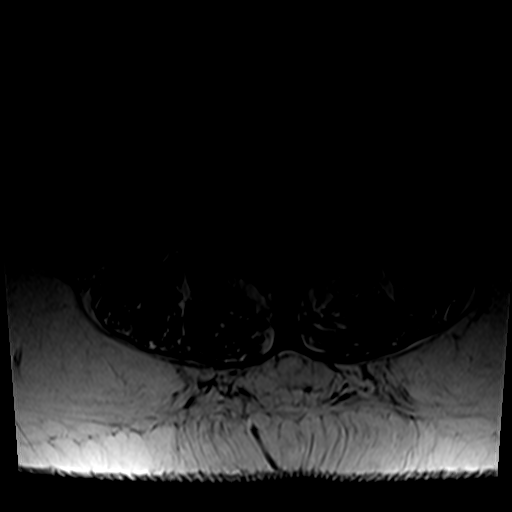
[im 21/41]
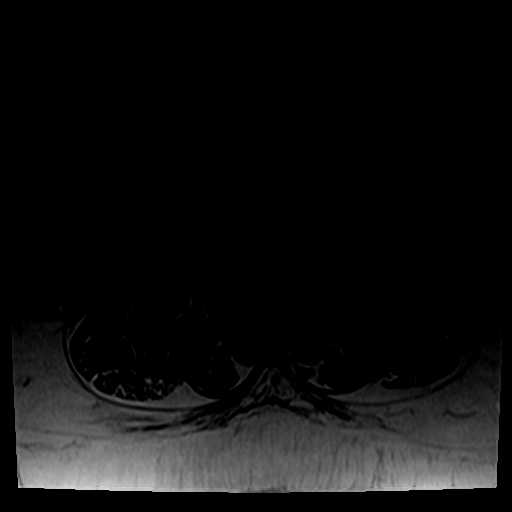
[im 35/41]
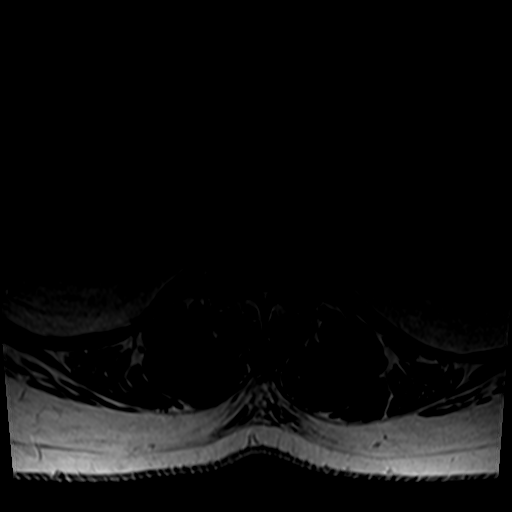

[25 of 48 positions shown; findings below may reference images not displayed]

FINDINGS: Segmentation: Conventional anatomy assumed, with the last open disc
space designated L5-S1.

Alignment:  Physiologic.

Vertebrae: No worrisome osseous lesion, acute fracture or pars
defect. There is facet mediated subchondral edema around the L5-S1
facet joints bilaterally. The visualized sacroiliac joints appear
unremarkable.

Conus medullaris: Extends to the L1-2 level and appears normal.

Paraspinal and other soft tissues: No significant paraspinal
findings.

Disc levels:

T12-L1: Normal interspace.

L1-2: Mild disc bulging and facet hypertrophy. No spinal stenosis or
nerve root encroachment.

L2-3: Mild disc bulging and loss of disc height. Mild facet
hypertrophy. No spinal stenosis or nerve root encroachment.

L3-4: Loss of disc height with mild disc bulging, facet and
ligamentous hypertrophy. No spinal stenosis or nerve root
encroachment.

L4-5: Loss of disc height with mild disc bulging and shallow central
disc protrusion. Mild facet and ligamentous hypertrophy. No spinal
stenosis or nerve root encroachment.

L5-S1: Mild loss of disc height and disc bulging. Moderate facet
hypertrophy with subchondral edema around the facet joints, worse on
the right. Reactive edema extends into the L5 pedicles bilaterally.
No spinal stenosis or nerve root encroachment.
IMPRESSION: 1. There is mild disc bulging with loss of disc height throughout
the lumbar spine. No resulting significant disc herniation, spinal
stenosis or nerve root encroachment.
2. Facet hypertrophy at L5-S1 with subchondral edema which may
contribute to low back pain. No acute osseous findings.

## 2020-10-11 ENCOUNTER — Encounter: Payer: Self-pay | Admitting: Family Medicine

## 2020-10-12 ENCOUNTER — Ambulatory Visit: Payer: 59 | Admitting: Family Medicine

## 2020-10-12 ENCOUNTER — Other Ambulatory Visit: Payer: Self-pay

## 2020-10-12 DIAGNOSIS — M5416 Radiculopathy, lumbar region: Secondary | ICD-10-CM

## 2020-10-13 ENCOUNTER — Ambulatory Visit
Admission: RE | Admit: 2020-10-13 | Discharge: 2020-10-13 | Disposition: A | Discharge status: Home / Self Care | Payer: 59 | Source: Ambulatory Visit | Attending: Family Medicine | Admitting: Family Medicine

## 2020-10-13 ENCOUNTER — Other Ambulatory Visit: Payer: Self-pay

## 2020-10-13 DIAGNOSIS — M5416 Radiculopathy, lumbar region: Secondary | ICD-10-CM

## 2020-10-13 IMAGING — XA DG FACET JT INJ L OR S SPINE SINGLE LEVEL UNI
2 series · 2 of 2 positions shown · non-contrast
Comparison: none

CLINICAL DATA: Lumbosacral spondylosis without myelopathy. Lumbar
facet arthropathy. Right greater than left low back pain.

[Series 1: ortho adipose · 1 of 1 slices shown (1 of 2)]
[im 1/1]
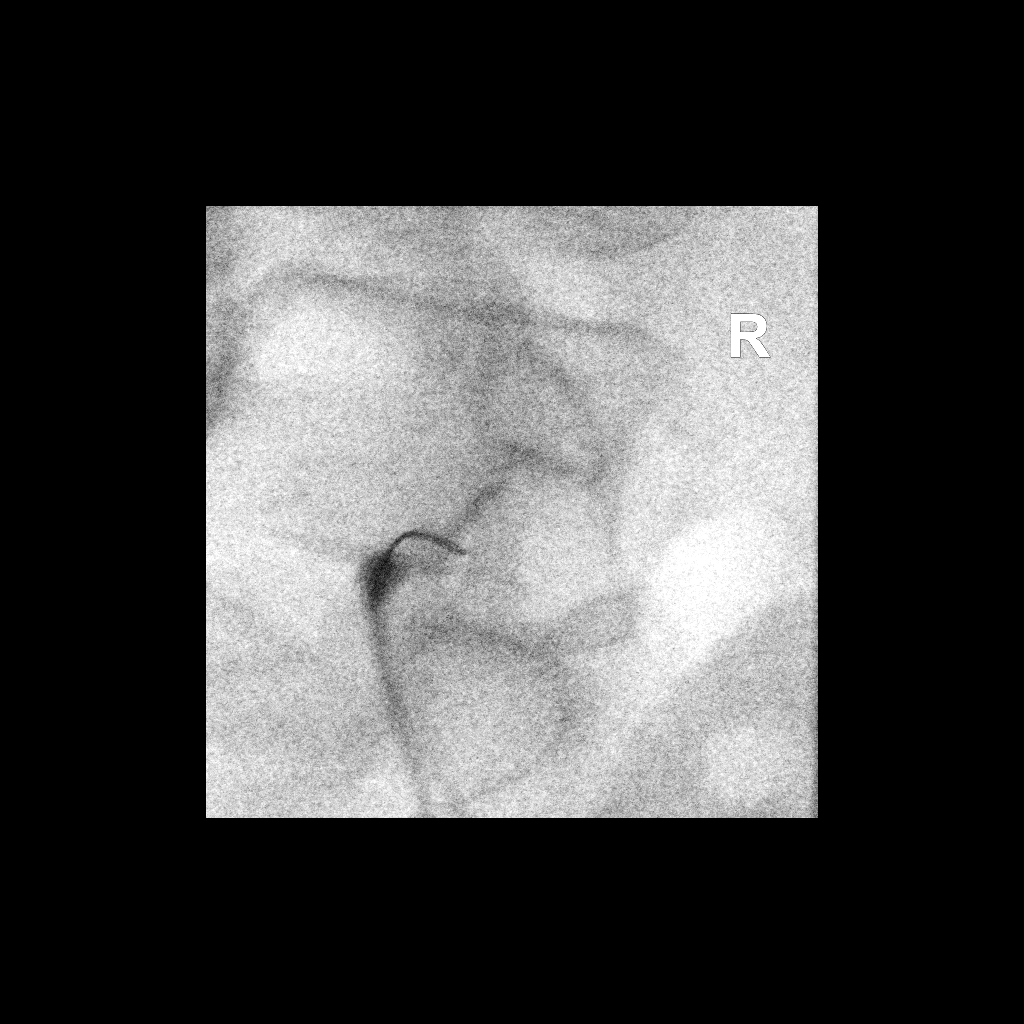

[Series 2: ortho adipose · 1 of 1 slices shown (2 of 2)]
[im 1/1]
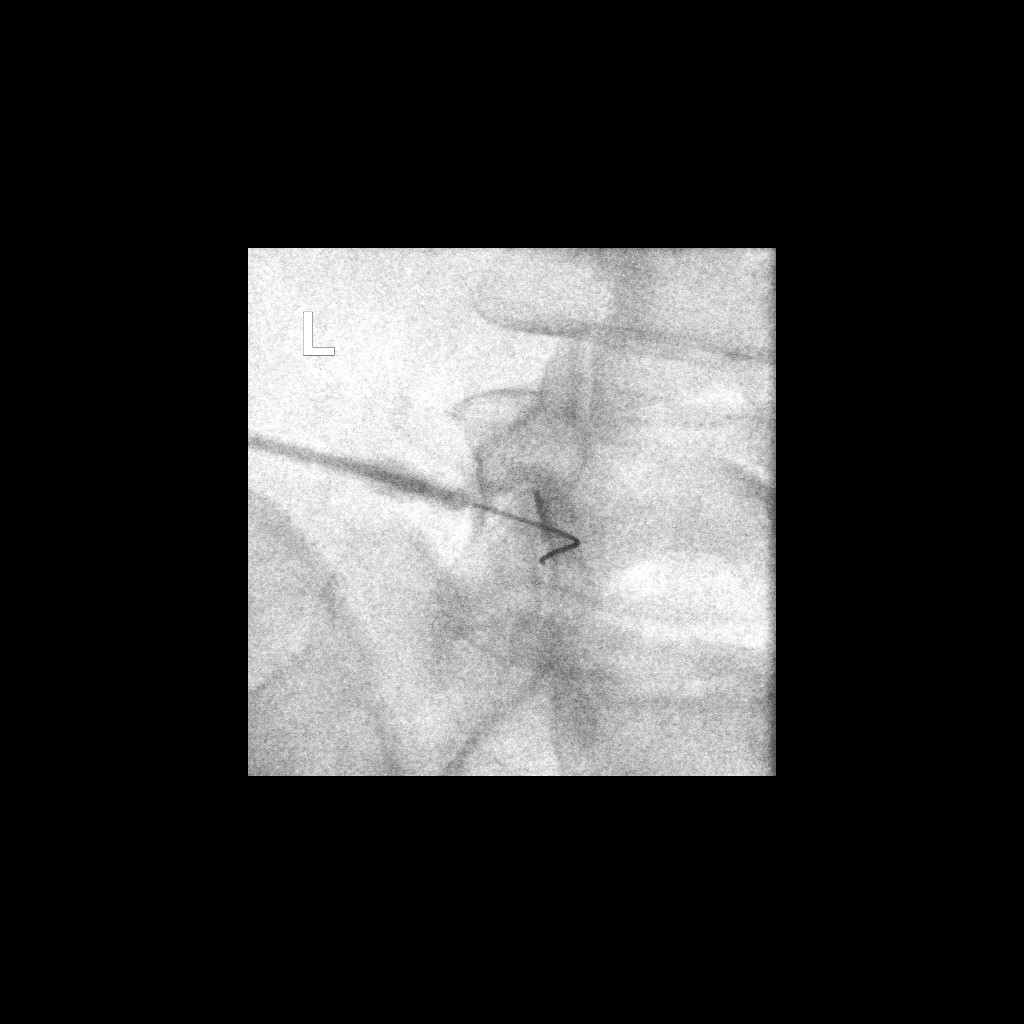

[2 of 2 positions shown; findings below may reference images not displayed]

FLUOROSCOPY TIME:  Fluoroscopy Time: 41 seconds

Radiation Exposure Index: 79.97 microGray*m^2

PROCEDURE:
The procedure, risks, benefits, and alternatives were explained to
the patient. Questions regarding the procedure were encouraged and
answered. The patient understands and consents to the procedure.

BILATERAL L5-S1 FACET INJECTIONS: A posterior oblique approach was
taken to the facet on the right and the left at L5-S1 using curved 5
inch 22 gauge spinal needles. Intra-articular positioning was
confirmed by injecting a small amount of Isovue-M 200. No vascular
opacification is seen. 40 mg of Depo-Medrol mixed with 1 mL of 0.5%
bupivacaine were instilled into each joint. The injections resulted
in concordant pain. The procedure was well-tolerated.
IMPRESSION: Technically successful bilateral L5-S1 facet injections.

## 2020-10-13 IMAGING — XA DG FACET JT INJ L OR S SPINE SINGLE LEVEL UNI
2 series · 2 of 2 positions shown · non-contrast
Comparison: none

CLINICAL DATA: Lumbosacral spondylosis without myelopathy. Lumbar
facet arthropathy. Right greater than left low back pain.

[Series 1: ortho adipose · 1 of 1 slices shown (1 of 2)]
[im 1/1]
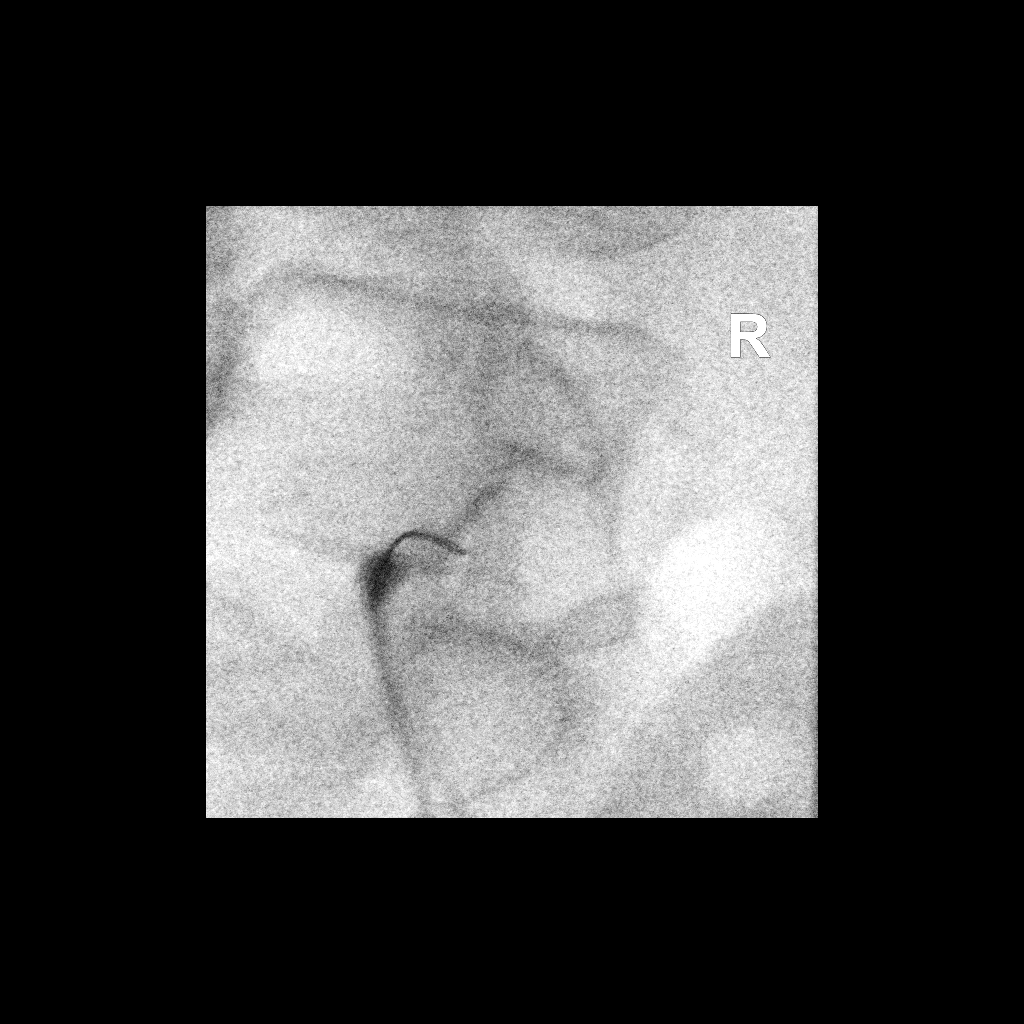

[Series 2: ortho adipose · 1 of 1 slices shown (2 of 2)]
[im 1/1]
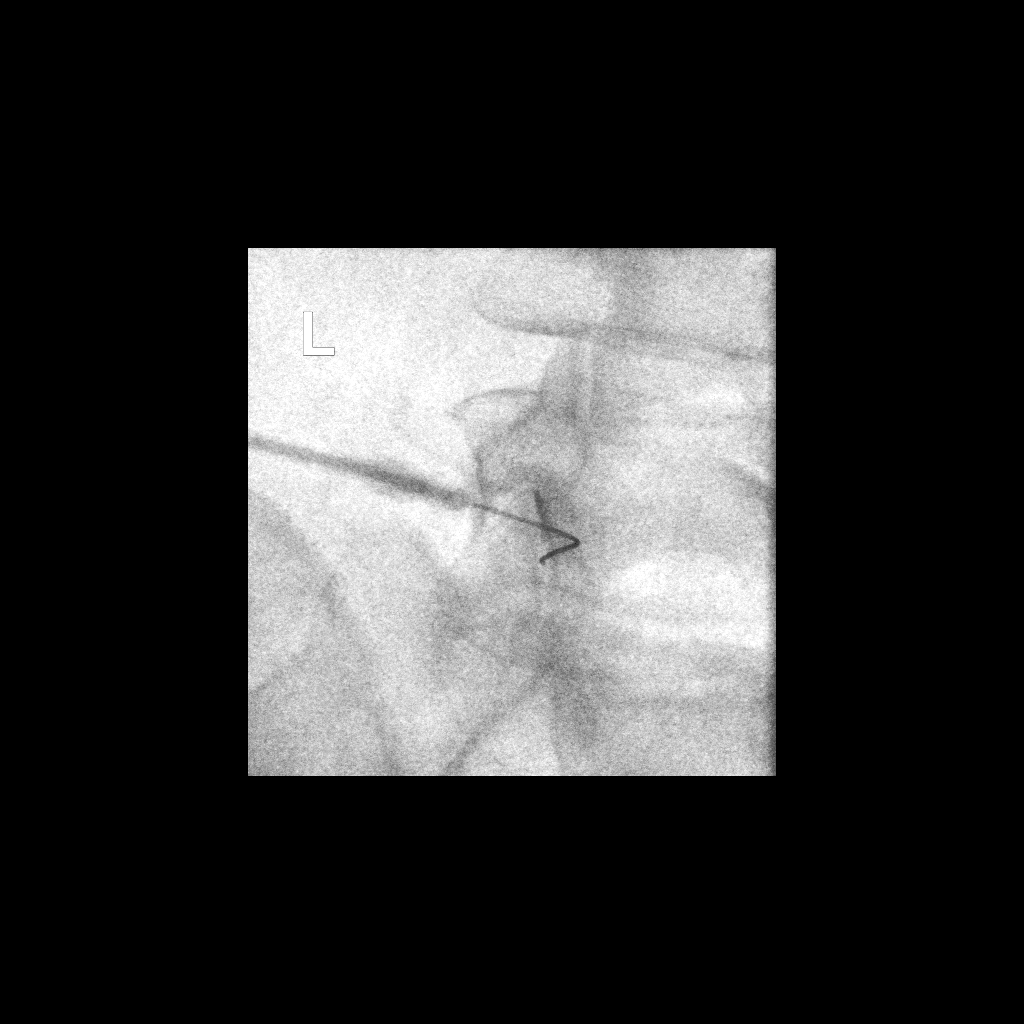

[2 of 2 positions shown; findings below may reference images not displayed]

FLUOROSCOPY TIME:  Fluoroscopy Time: 41 seconds

Radiation Exposure Index: 79.97 microGray*m^2

PROCEDURE:
The procedure, risks, benefits, and alternatives were explained to
the patient. Questions regarding the procedure were encouraged and
answered. The patient understands and consents to the procedure.

BILATERAL L5-S1 FACET INJECTIONS: A posterior oblique approach was
taken to the facet on the right and the left at L5-S1 using curved 5
inch 22 gauge spinal needles. Intra-articular positioning was
confirmed by injecting a small amount of Isovue-M 200. No vascular
opacification is seen. 40 mg of Depo-Medrol mixed with 1 mL of 0.5%
bupivacaine were instilled into each joint. The injections resulted
in concordant pain. The procedure was well-tolerated.
IMPRESSION: Technically successful bilateral L5-S1 facet injections.

## 2020-10-13 MED ORDER — IOPAMIDOL (ISOVUE-M 200) INJECTION 41%
1.0000 mL | Freq: Once | INTRAMUSCULAR | Status: AC
  Administered 2020-10-13: 1 mL via INTRA_ARTICULAR

## 2020-10-13 MED ORDER — METHYLPREDNISOLONE ACETATE 40 MG/ML INJ SUSP (RADIOLOG
80.0000 mg | Freq: Once | INTRAMUSCULAR | Status: AC
  Administered 2020-10-13: 80 mg via INTRA_ARTICULAR

## 2020-10-13 NOTE — Discharge Instructions (Signed)

## 2020-10-28 NOTE — Progress Notes (Deleted)
[facility] [place] Phone: [phone] Subjective:    I'm seeing this patient by the request  of:  Caren Macadam, MD  CC:   [id number]  10/08/2020 atient states continues to have more of a lumbar radiculopathy.  Patient is having worsening pain and feels that meloxicam did not help.  Patient is more radicular symptoms as well.  Patient is doing a lot of conservative therapy for over a year for this back and has not made significant progress.  Attempted osteopathic manipulation as well as well as medications such as Flexeril and duloxetine.  Do feel that advanced imaging is At this time.  Patient has been very motivated and has lost weight but is unfortunately unable to lose any more weight secondary to the pain.  Unable to even pick up her dog and sometimes has difficulty with stairs.  Depending on imaging patient could be a candidate for possible epidurals.  Patient will follow up with me after imaging to discuss further.  Update 11/04/2020 Alyssa Clay is a 55 y.o. female coming in with complaint of LBP. Patient states   Onset-  Location Duration-  Character- Aggravating factors- Reliving factors-  Therapies tried-  Severity-     Past Medical History:  Diagnosis Date   Allergy    Anemia    Asthma    Constipation    issues x the past month -? 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   Enlarged ovary 09/15/2020   noted on ct scan, per pt   FUO (fever of unknown origin) 10/21/2019   GERD (gastroesophageal reflux disease)    Hypertension    PAST hx    Migraine    Past Surgical History:  Procedure Laterality Date   APPENDECTOMY     BREAST REDUCTION SURGERY Bilateral 06/12/2019   Procedure: BREAST REDUCTION WITH LIPOSUCTION;  Surgeon: Wallace Going, DO;  Location: [facility];  Service: Plastics;   Laterality: Bilateral;  4 hours, please   [place] AND CURETTAGE OF UTERUS     multiple due to miscarriages   KNEE ARTHROSCOPY Left 2013   LAPAROSCOPIC APPENDECTOMY N/A 12/08/2018   Procedure: APPENDECTOMY LAPAROSCOPIC;  Surgeon: Donnie Mesa, MD;  Location: [facility];  Service: General;  Laterality: N/A;   TENNIS ELBOW RELEASE/NIRSCHEL PROCEDURE  2009   WISDOM TOOTH EXTRACTION     Social History   Socioeconomic History   Marital status: Married    Spouse name: Not on file   Number of children: Not on file   Years of education: Not on file   Highest education level: Not on file  Occupational History   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  Tobacco Use   Smoking status: Never   Smokeless tobacco: Never  Vaping Use   Vaping Use: Never used  Substance and Sexual Activity   Alcohol use: Yes    Comment: occ 2-3 beers a month    Drug use: Never   Sexual activity: Yes    Partners: Male  Other Topics Concern   Not on file  Social History Narrative   Not on file   Social Determinants of Health   Financial Resource Strain: Not on file  Food Insecurity: Not on file  Transportation Needs: Not on file  Physical Activity: Not on file  Stress: Not on file  Social Connections: Not on file   No Known Allergies Family History  Problem Relation Age of Onset   Depression Mother    Lymphoma Mother 22   Hypertension Father    Heart attack Father 30   Hypertension Brother    High blood pressure Brother    High Cholesterol Brother    Healthy Daughter    Colon cancer Maternal Aunt 65   Esophageal cancer Neg Hx    Pancreatic cancer Neg Hx    Stomach cancer Neg Hx    Colon polyps Neg Hx    Rectal cancer Neg Hx         Current Outpatient Medications (Respiratory):    albuterol (PROAIR HFA) 108 (90 Base) MCG/ACT inhaler, Inhale 2 puffs into the lungs every 4 (four) hours as needed for wheezing or shortness of breath.   albuterol (PROVENTIL) (2.5 MG/3ML) 0.083% nebulizer  solution, Take 2.5 mg by nebulization every 6 (six) hours as needed for wheezing or shortness of breath.   budesonide-formoterol (SYMBICORT) 160-4.5 MCG/ACT inhaler, USE 2 INHALATIONS BY MOUTH  TWICE DAILY   FASENRA 30 MG/ML SOSY, SECOND SHIP: INJECT ONE SYRINGE UNDER THE SKIN AT WEEK 4 AND 8, THEN EVERY 8 WEEKS THEREAFTER. (Patient taking differently: Inject 30 mg into the skin See admin instructions. Every 8 weeks)   fluticasone (FLONASE) 50 MCG/ACT nasal spray, Place 1 spray into both nostrils daily.   fluticasone (FLOVENT HFA) 110 MCG/ACT inhaler, Inhale 2 puffs into the lungs 2 (two) times daily. During respiratory infections/asthma flares.   montelukast (SINGULAIR) 10 MG tablet, Take 1 tablet (10 mg total) by mouth daily.   Current Outpatient Medications (Analgesics):    Erenumab-aooe (AIMOVIG) 140 MG/ML SOAJ, INJECT 140 MG INTO THE SKIN EVERY 30 (THIRTY) DAYS.   meloxicam (MOBIC) 15 MG tablet, TAKE 1 TABLET (15 MG TOTAL) BY MOUTH DAILY.   Rimegepant Sulfate (NURTEC) 75 MG TBDP, Take by mouth.   rizatriptan (MAXALT) 10 MG tablet, TAKE 1 TABLET BY MOUTH ONCE AS NEEDED FOR MIGRAINE FOR UP TO 1 DOSE. MAY REPEAT IN 2 HOURS IF NEEDED     Current Outpatient Medications (Other):    buPROPion (WELLBUTRIN XL) 150 MG 24 hr tablet, Take 1 tablet (150 mg total) by mouth daily.   calcium carbonate (OSCAL) 1500 (600 Ca) MG TABS tablet, Take 600 mg of elemental calcium by mouth 2 (two) times daily with a meal.   cyclobenzaprine (FLEXERIL) 10 MG tablet, Take 10 mg by mouth 3 (three) times daily as needed (migraines).   DULoxetine (CYMBALTA) 60 MG capsule, TAKE 1 CAPSULE BY MOUTH EVERY DAY   Estradiol (YUVAFEM) 10 MCG TABS vaginal tablet, Place 1 tablet (10 mcg total) vaginally daily.   gabapentin (NEURONTIN) 300 MG capsule, Take 300-900 mg by mouth See admin instructions. Take 1 tablet every AM, three tablets at bedtime   hydrOXYzine (ATARAX/VISTARIL) 25 MG tablet, Take 25 mg by mouth 3 (three) times  daily as needed.   Multiple Vitamin (MULTIVITAMIN WITH MINERALS) TABS tablet, Take 1 tablet by mouth daily.   nystatin cream (MYCOSTATIN), Apply 1 application topically 2 (two) times daily.   pantoprazole (PROTONIX) 40 MG tablet, TAKE 1 TABLET BY MOUTH 2 TIMES DAILY BEFORE A MEAL.   Respiratory Therapy Supplies (FLUTTER) DEVI, Use 3 times daily as directed   tiZANidine (ZANAFLEX) 4 MG tablet, Take 4 mg by mouth every 6 (six) hours as needed for muscle spasms.   traZODone (DESYREL) 100 MG tablet, Take 1-2 tablets (100-200 mg total) by mouth  at bedtime as needed for sleep.   valACYclovir (VALTREX) 1000 MG tablet, Take 1 tablet (1,000 mg total) by mouth 2 (two) times daily.  Current Facility-Administered Medications (Other):    0.9 %  sodium chloride infusion   Reviewed prior external information including notes and imaging from  primary care provider As well as notes that were available from care everywhere and other healthcare systems.  Past medical history, social, surgical and family history all reviewed in electronic medical record.  No pertanent information unless stated regarding to the chief complaint.   Review of Systems:  No headache, visual changes, nausea, vomiting, diarrhea, constipation, dizziness, abdominal pain, skin rash, fevers, chills, night sweats, weight loss, swollen lymph nodes, body aches, joint swelling, chest pain, shortness of breath, mood changes. POSITIVE muscle aches  Objective  Last menstrual period 12/20/2018.   General: No apparent distress alert and oriented x3 mood and affect normal, dressed appropriately.  HEENT: Pupils equal, extraocular movements intact  Respiratory: Patient's speak in full sentences and does not appear short of breath  Cardiovascular: No lower extremity edema, non tender, no erythema  Gait normal with good balance and coordination.  MSK:  Non tender with full range of motion and good stability and symmetric strength and tone of  shoulders, elbows, wrist, hip, knee and ankles bilaterally.     Impression and Recommendations:     The above documentation has been reviewed and is accurate and complete Jacqualin Combes

## 2020-10-29 ENCOUNTER — Other Ambulatory Visit: Payer: Self-pay | Admitting: Family Medicine

## 2020-10-29 ENCOUNTER — Encounter: Payer: Self-pay | Admitting: Family Medicine

## 2020-10-29 DIAGNOSIS — E785 Hyperlipidemia, unspecified: Secondary | ICD-10-CM

## 2020-10-29 MED ORDER — ROSUVASTATIN CALCIUM 5 MG PO TABS: 5.0000 mg | ORAL_TABLET | Freq: Every day | ORAL | 3 refills | Status: DC

## 2020-10-31 ENCOUNTER — Encounter: Payer: Self-pay | Admitting: Family Medicine

## 2020-11-01 ENCOUNTER — Encounter: Payer: Self-pay | Admitting: Family Medicine

## 2020-11-01 ENCOUNTER — Telehealth (INDEPENDENT_AMBULATORY_CARE_PROVIDER_SITE_OTHER): Payer: 59 | Admitting: Family Medicine

## 2020-11-01 DIAGNOSIS — J4550 Severe persistent asthma, uncomplicated: Secondary | ICD-10-CM | POA: Diagnosis not present

## 2020-11-01 DIAGNOSIS — J986 Disorders of diaphragm: Secondary | ICD-10-CM

## 2020-11-01 DIAGNOSIS — U071 COVID-19: Secondary | ICD-10-CM | POA: Diagnosis not present

## 2020-11-01 MED ORDER — NIRMATRELVIR/RITONAVIR (PAXLOVID)TABLET: 3.0000 | ORAL_TABLET | Freq: Two times a day (BID) | ORAL | 0 refills | Status: AC

## 2020-11-01 NOTE — Progress Notes (Signed)
Virtual Visit via Video Note  I connected with Alyssa Clay n 11/01/20 at  9:00 AM EDT by a video enabled telemedicine application and verified that I am speaking with the correct person using two identifiers.  Location patient: home Location provider: [name], [place] [address] Persons participating in the virtual visit: patient, provider  I discussed the limitations of evaluation and management by telemedicine and the availability of in person appointments. The patient expressed understanding and agreed to proceed.   Alyssa Clay DOB: 1965-05-14 Encounter date: 11/01/2020  This is a 55 y.o. female who presents with Chief Complaint  Patient presents with   Covid Exposure    States husband tested positive for Covid on Friday due to being sick on Thursday   Cough    Patient complains of a productive cough with thick-clear sputum x3 days, tried Mucinex DM and Motrin    History of present illness:  She started with symptoms on Saturday morning. Husband didn't test positive until Saturday. She has been trying to use inhalers, but nose stuffed, trying mucinex D, tried nyquil. She is achy. She did have diarrhea; has had some sinus pain, pressure, headache. Had fever overnight. Took tylenol. Took motrin this morning.   She is using albuterol q 4 hours; is getting relief with this. Continues with symbicort 168mcg 2 puffs BID. Not feeling short of breath. Has pulse ox at home but hasn't used this. Cough is regular, productive.    No Known Allergies Current Meds  Medication Sig   albuterol (PROAIR HFA) 108 (90 Base) MCG/ACT inhaler Inhale 2 puffs into the lungs every 4 (four) hours as needed for wheezing or shortness of breath.   albuterol (PROVENTIL) (2.5 MG/3ML) 0.083% nebulizer solution Take 2.5 mg by nebulization every 6 (six) hours as needed for wheezing or shortness of breath.   budesonide-formoterol (SYMBICORT) 160-4.5 MCG/ACT inhaler  USE 2 INHALATIONS BY MOUTH  TWICE DAILY   buPROPion (WELLBUTRIN XL) 150 MG 24 hr tablet Take 1 tablet (150 mg total) by mouth daily.   calcium carbonate (OSCAL) 1500 (600 Ca) MG TABS tablet Take 600 mg of elemental calcium by mouth 2 (two) times daily with a meal.   cyclobenzaprine (FLEXERIL) 10 MG tablet Take 10 mg by mouth 3 (three) times daily as needed (migraines).   DULoxetine (CYMBALTA) 60 MG capsule TAKE 1 CAPSULE BY MOUTH  DAILY   Erenumab-aooe (AIMOVIG) 140 MG/ML SOAJ INJECT 140 MG INTO THE SKIN EVERY 30 (THIRTY) DAYS.   Estradiol (YUVAFEM) 10 MCG TABS vaginal tablet Place 1 tablet (10 mcg total) vaginally daily.   FASENRA 30 MG/ML SOSY SECOND SHIP: INJECT ONE SYRINGE UNDER THE SKIN AT WEEK 4 AND 8, THEN EVERY 8 WEEKS THEREAFTER. (Patient taking differently: Inject 30 mg into the skin See admin instructions. Every 8 weeks)   fluticasone (FLONASE) 50 MCG/ACT nasal spray Place 1 spray into both nostrils daily.   fluticasone (FLOVENT HFA) 110 MCG/ACT inhaler Inhale 2 puffs into the lungs 2 (two) times daily. During respiratory infections/asthma flares.   gabapentin (NEURONTIN) 300 MG capsule Take 300-900 mg by mouth See admin instructions. Take 1 tablet every AM, three tablets at bedtime   hydrOXYzine (ATARAX/VISTARIL) 25 MG tablet Take 25 mg by mouth 3 (three) times daily as needed.   meloxicam (MOBIC) 15 MG tablet TAKE 1 TABLET (15 MG TOTAL) BY MOUTH DAILY.   montelukast (SINGULAIR) 10 MG tablet Take 1 tablet (10 mg total) by mouth daily.  Multiple Vitamin (MULTIVITAMIN WITH MINERALS) TABS tablet Take 1 tablet by mouth daily.   nystatin cream (MYCOSTATIN) Apply 1 application topically 2 (two) times daily.   pantoprazole (PROTONIX) 40 MG tablet TAKE 1 TABLET BY MOUTH 2 TIMES DAILY BEFORE A MEAL.   Respiratory Therapy Supplies (FLUTTER) DEVI Use 3 times daily as directed   Rimegepant Sulfate (NURTEC) 75 MG TBDP Take by mouth.   rizatriptan (MAXALT) 10 MG tablet TAKE 1 TABLET BY MOUTH ONCE  AS NEEDED FOR MIGRAINE FOR UP TO 1 DOSE. MAY REPEAT IN 2 HOURS IF NEEDED   rosuvastatin (CRESTOR) 5 MG tablet Take 1 tablet (5 mg total) by mouth at bedtime.   tiZANidine (ZANAFLEX) 4 MG tablet Take 4 mg by mouth every 6 (six) hours as needed for muscle spasms.   traZODone (DESYREL) 100 MG tablet Take 1-2 tablets (100-200 mg total) by mouth at bedtime as needed for sleep.   valACYclovir (VALTREX) 1000 MG tablet Take 1 tablet (1,000 mg total) by mouth 2 (two) times daily.   Current Facility-Administered Medications for the 11/01/20 encounter (Video Visit) with Caren Macadam, MD  Medication   0.9 %  sodium chloride infusion    Review of Systems  Constitutional:  Positive for fatigue and fever. Negative for chills.  HENT:  Positive for congestion, postnasal drip, sinus pressure and sinus pain. Negative for ear pain and sore throat.   Respiratory:  Positive for cough. Negative for shortness of breath and wheezing (controlled with inhalers).   Cardiovascular:  Negative for chest pain.   Objective:  LMP 12/20/2018       BP Readings from Last 3 Encounters:  10/13/20 (!) 159/85  10/08/20 140/80  09/16/20 112/82   Wt Readings from Last 3 Encounters:  10/08/20 253 lb (114.8 kg)  09/16/20 253 lb (114.8 kg)  09/15/20 255 lb (115.7 kg)    EXAM:  GENERAL: alert, oriented, appears sick but in no acute distress  HEENT: atraumatic, conjunctiva clear, no obvious abnormalities on inspection of external nose and ears  NECK: normal movements of the head and neck  LUNGS: on inspection no signs of respiratory distress, breathing rate appears normal, she is coughing regularly (productive)  CV: no obvious cyanosis  MS: moves all visible extremities without noticeable abnormality  PSYCH/NEURO: pleasant and cooperative, no obvious depression or anxiety, speech and thought processing grossly intact   Assessment/Plan   1. COVID-19 Home test yesterday negative, but that was less than 24  hours after sx onset; husband COVID positive. Son also sick. Due to asthma hx, I am going to send in paxlovid for her because I do worry about resp complications. She has been fully vaccinated. We discussed monitoring pulse ox. Let us know if any worsening of breathing or if not feeling somewhat better overall in 48-72 hours.   2. Severe persistent asthma, unspecified whether complicated Continue with symbicort, albuterol as she has been taking these.   3. Diaphragm dysfunction We discussed trying to be up and moving around when feeling up to it, to help with full aeration of lungs. Continue with inhalers.    follow up: prn  I discussed the assessment and treatment plan with the patient. The patient was provided an opportunity to ask questions and all were answered. The patient agreed with the plan and demonstrated an understanding of the instructions.   The patient was advised to call back or seek an in-person evaluation if the symptoms worsen or if the condition fails to improve as anticipated.  I provided 20 minutes of non-face-to-face time during this encounter.   Micheline Rough, MD

## 2020-11-04 ENCOUNTER — Ambulatory Visit: Payer: 59 | Admitting: Family Medicine

## 2020-11-04 ENCOUNTER — Emergency Department (HOSPITAL_BASED_OUTPATIENT_CLINIC_OR_DEPARTMENT_OTHER): Payer: 59

## 2020-11-04 ENCOUNTER — Telehealth: Payer: Self-pay | Admitting: Internal Medicine

## 2020-11-04 ENCOUNTER — Other Ambulatory Visit: Payer: Self-pay

## 2020-11-04 ENCOUNTER — Emergency Department (HOSPITAL_BASED_OUTPATIENT_CLINIC_OR_DEPARTMENT_OTHER)
Admission: EM | Admit: 2020-11-04 | Discharge: 2020-11-04 | Disposition: A | Discharge status: Home / Self Care | Payer: 59 | Attending: Emergency Medicine | Admitting: Emergency Medicine

## 2020-11-04 ENCOUNTER — Encounter (HOSPITAL_BASED_OUTPATIENT_CLINIC_OR_DEPARTMENT_OTHER): Payer: Self-pay

## 2020-11-04 DIAGNOSIS — J45909 Unspecified asthma, uncomplicated: Secondary | ICD-10-CM | POA: Diagnosis not present

## 2020-11-04 DIAGNOSIS — I10 Essential (primary) hypertension: Secondary | ICD-10-CM | POA: Insufficient documentation

## 2020-11-04 DIAGNOSIS — R059 Cough, unspecified: Secondary | ICD-10-CM

## 2020-11-04 DIAGNOSIS — R0602 Shortness of breath: Secondary | ICD-10-CM

## 2020-11-04 DIAGNOSIS — U071 COVID-19: Secondary | ICD-10-CM | POA: Diagnosis not present

## 2020-11-04 DIAGNOSIS — Z7951 Long term (current) use of inhaled steroids: Secondary | ICD-10-CM | POA: Diagnosis not present

## 2020-11-04 DIAGNOSIS — R5383 Other fatigue: Secondary | ICD-10-CM

## 2020-11-04 LAB — COMPREHENSIVE METABOLIC PANEL
ALT: 19 U/L (ref 0–44)
AST: 15 U/L (ref 15–41)
Albumin: 4.2 g/dL (ref 3.5–5.0)
Alkaline Phosphatase: 64 U/L (ref 38–126)
Anion gap: 8 (ref 5–15)
BUN: 9 mg/dL (ref 6–20)
CO2: 28 mmol/L (ref 22–32)
Calcium: 9 mg/dL (ref 8.9–10.3)
Chloride: 104 mmol/L (ref 98–111)
Creatinine, Ser: 0.82 mg/dL (ref 0.44–1.00)
GFR, Estimated: >60 mL/min (ref >60)
Glucose, Bld: 93 mg/dL (ref 70–99)
Potassium: 3.4 mmol/L — ABNORMAL LOW (ref 3.5–5.1)
Sodium: 140 mmol/L (ref 135–145)
Total Bilirubin: 0.3 mg/dL (ref 0.3–1.2)
Total Protein: 7.2 g/dL (ref 6.5–8.1)

## 2020-11-04 LAB — CBC WITH DIFFERENTIAL/PLATELET
Abs Immature Granulocytes: 0.01 10*3/uL (ref 0.00–0.07)
Basophils Absolute: 0 10*3/uL (ref 0.0–0.1)
Basophils Relative: 1 %
Eosinophils Absolute: 0 10*3/uL (ref 0.0–0.5)
Eosinophils Relative: 0 %
HCT: 42.3 % (ref 36.0–46.0)
Hemoglobin: 13.9 g/dL (ref 12.0–15.0)
Immature Granulocytes: 0 %
Lymphocytes Relative: 24 %
Lymphs Abs: 1.4 10*3/uL (ref 0.7–4.0)
MCH: 29.8 pg (ref 26.0–34.0)
MCHC: 32.9 g/dL (ref 30.0–36.0)
MCV: 90.6 fL (ref 80.0–100.0)
Monocytes Absolute: 0.5 10*3/uL (ref 0.1–1.0)
Monocytes Relative: 8 %
Neutro Abs: 4.1 10*3/uL (ref 1.7–7.7)
Neutrophils Relative %: 67 %
Platelets: 317 10*3/uL (ref 150–400)
RBC: 4.67 MIL/uL (ref 3.87–5.11)
RDW: 13 % (ref 11.5–15.5)
WBC: 6 10*3/uL (ref 4.0–10.5)
nRBC: 0 % (ref 0.0–0.2)

## 2020-11-04 LAB — LACTIC ACID, PLASMA: Lactic Acid, Venous: 0.8 mmol/L (ref 0.5–1.9)

## 2020-11-04 IMAGING — DX DG CHEST 1V PORT
1 series · 1 of 1 positions shown · non-contrast
Comparison: Chest radiographs [DATE] and earlier.

CLINICAL DATA: 54-year-old female positive [E5]. Cough and
shortness of breath.

EXAM:
PORTABLE CHEST 1 VIEW

[chest]
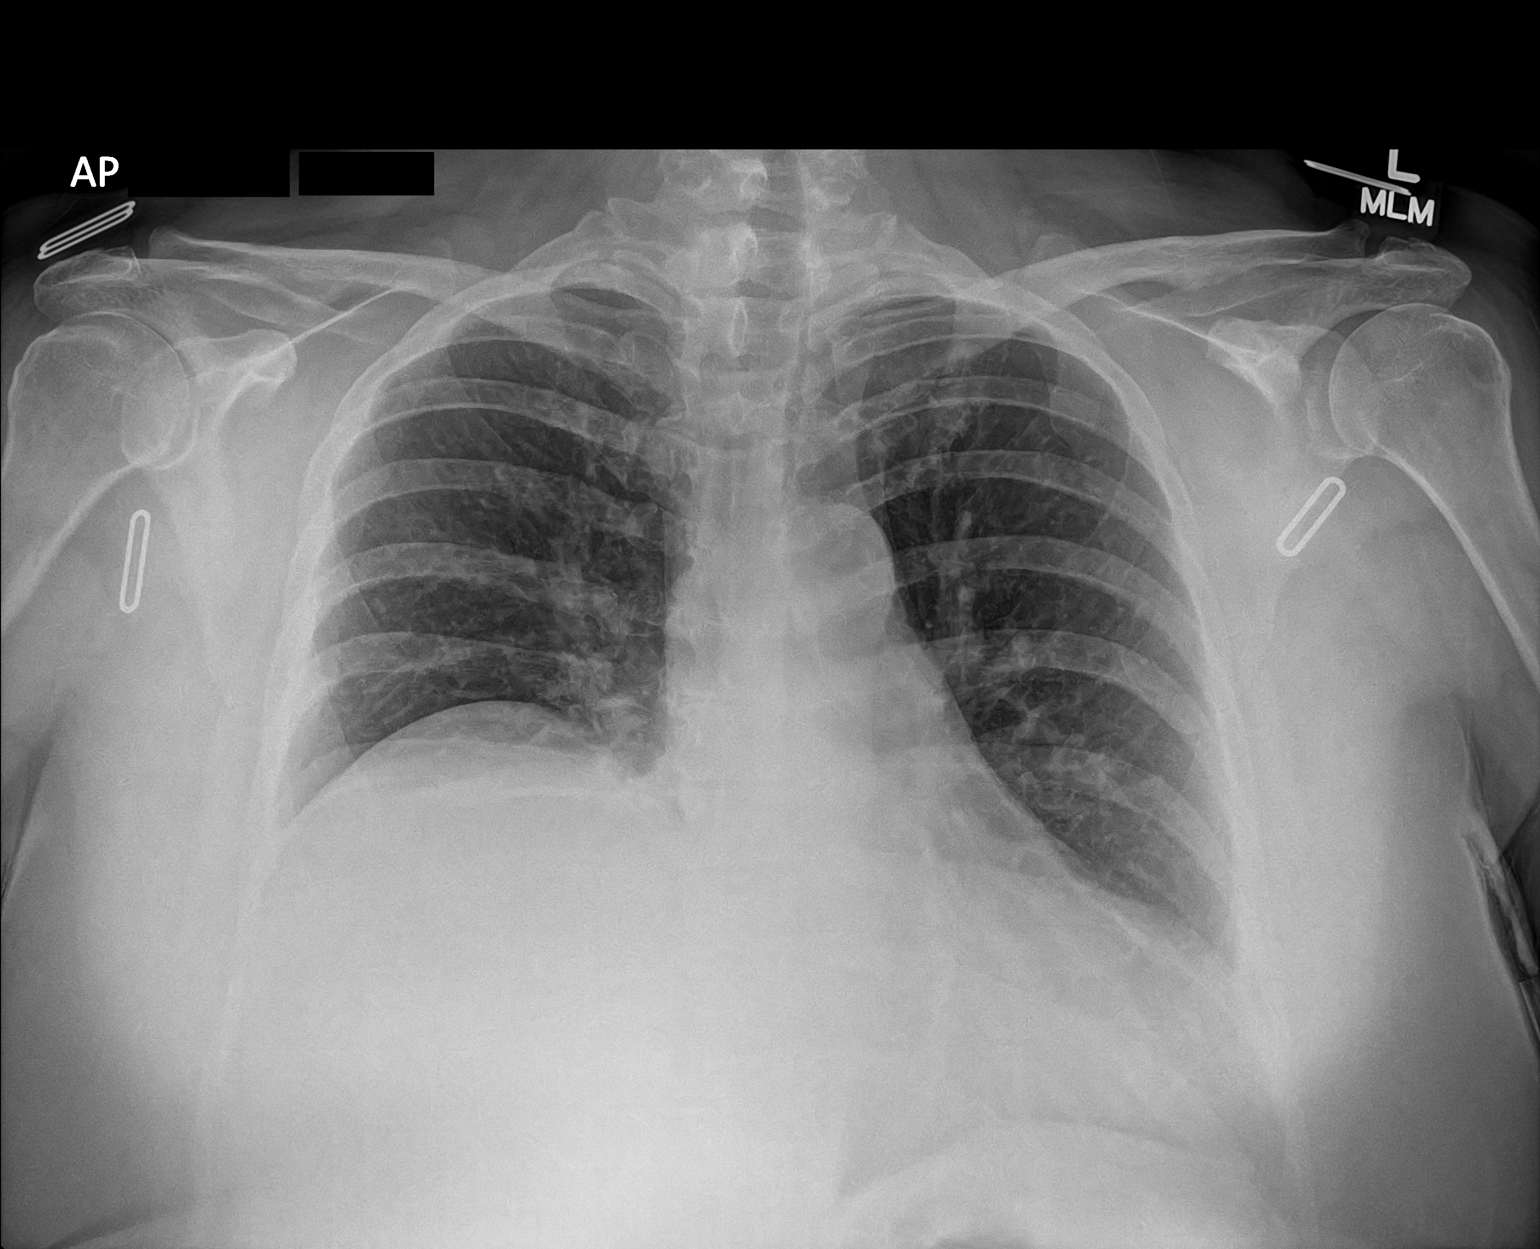

[1 of 1 positions shown; findings below may reference images not displayed]

FINDINGS: Portable AP upright view at [E5] hours. Chronic elevation of the
right hemidiaphragm compatible with phrenic nerve palsy. But
improved lung volumes compared to last year. Mediastinal contours
are stable and within normal limits. Allowing for portable technique
the lungs are clear. Visualized tracheal air column is within normal
limits. No pneumothorax. No acute osseous abnormality identified.
IMPRESSION: Chronic right phrenic nerve palsy. No acute cardiopulmonary
abnormality.

## 2020-11-04 MED ORDER — PREDNISONE 50 MG PO TABS: 50.0000 mg | ORAL_TABLET | Freq: Every day | ORAL | 0 refills | Status: AC

## 2020-11-04 NOTE — ED Provider Notes (Signed)
[facility] Provider Note   CSN: [id number] Arrival date & time: 11/04/20  4944     History Chief Complaint  Patient presents with   Asthma    Alyssa Clay is a 55 y.o. female.  The history is provided by the patient and medical records. No language interpreter was used.  Asthma This is a chronic problem. The problem occurs constantly. The problem has not changed since onset.Associated symptoms include shortness of breath. Pertinent negatives include no chest pain, no abdominal pain and no headaches. Nothing aggravates the symptoms. Nothing relieves the symptoms. She has tried nothing for the symptoms. The treatment provided no relief.      Past Medical History:  Diagnosis Date   Allergy    Anemia    Asthma    Constipation    issues x the past month -? 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   Enlarged ovary 09/15/2020   noted on ct scan, per pt   FUO (fever of unknown origin) 10/21/2019   GERD (gastroesophageal reflux disease)    Hypertension    PAST hx    Migraine     Patient Active Problem List   Diagnosis Date Noted   Lumbar radiculopathy, right 10/08/2020   SI (sacroiliac) joint dysfunction 09/16/2020   Somatic dysfunction of spine, sacral 09/16/2020   OSA (obstructive sleep apnea) 12/18/2019   FUO (fever of unknown origin) 10/21/2019   S/P bilateral breast reduction 06/19/2019   Back pain 04/15/2019   Neck pain 04/15/2019   History of frequent upper respiratory infection 01/16/2019   Atelectasis 01/16/2019   Severe persistent asthma 01/16/2019   Shortness of breath 01/01/2019   Primary osteoarthritis of both knees 07/10/2018   Hypertension 03/27/2018   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/2018   Nonallergic rhinitis 01/07/2018   Gastroesophageal reflux disease 01/07/2018    Past Surgical History:  Procedure Laterality  Date   APPENDECTOMY     BREAST REDUCTION SURGERY Bilateral 06/12/2019   Procedure: BREAST REDUCTION WITH LIPOSUCTION;  Surgeon: Wallace Going, DO;  Location: [facility];  Service: Plastics;  Laterality: Bilateral;  4 hours, please   CESAREAN SECTION  1994   DILATION AND CURETTAGE OF UTERUS     multiple due to miscarriages   KNEE ARTHROSCOPY Left 2013   LAPAROSCOPIC APPENDECTOMY N/A 12/08/2018   Procedure: APPENDECTOMY LAPAROSCOPIC;  Surgeon: Donnie Mesa, MD;  Location: [facility];  Service: General;  Laterality: N/A;   TENNIS ELBOW RELEASE/NIRSCHEL PROCEDURE  2009   WISDOM TOOTH EXTRACTION       OB History     Gravida  5   Para  1   Term  1   Preterm      AB  4   Living  2      SAB  4   IAB      Ectopic      Multiple      Live Births  1           Family History  Problem Relation Age of Onset   Depression Mother    Lymphoma Mother 41   Hypertension Father    Heart attack Father 45   Hypertension Brother    High blood pressure Brother    High Cholesterol Brother    Healthy Daughter    Colon cancer Maternal Aunt 37  Esophageal cancer Neg Hx    Pancreatic cancer Neg Hx    Stomach cancer Neg Hx    Colon polyps Neg Hx    Rectal cancer Neg Hx     Social History   Tobacco Use   Smoking status: Never   Smokeless tobacco: Never  Vaping Use   Vaping Use: Never used  Substance Use Topics   Alcohol use: Yes    Comment: occ 2-3 beers a month    Drug use: Never    Home Medications Prior to Admission medications   Medication Sig Start Date End Date Taking? Authorizing Provider  albuterol (PROAIR HFA) 108 (90 Base) MCG/ACT inhaler Inhale 2 puffs into the lungs every 4 (four) hours as needed for wheezing or shortness of breath. 07/16/20  Yes Garnet Sierras, DO  albuterol (PROVENTIL) (2.5 MG/3ML) 0.083% nebulizer solution Take 2.5 mg by nebulization every 6 (six) hours as needed for wheezing or shortness of breath.   Yes [provider]  budesonide-formoterol (SYMBICORT) 160-4.5 MCG/ACT inhaler USE 2 INHALATIONS BY MOUTH  TWICE DAILY 08/23/20  Yes Garnet Sierras, DO  buPROPion (WELLBUTRIN XL) 150 MG 24 hr tablet Take 1 tablet (150 mg total) by mouth daily. 06/30/20  Yes Koberlein, Steele Berg, MD  calcium carbonate (OSCAL) 1500 (600 Ca) MG TABS tablet Take 600 mg of elemental calcium by mouth 2 (two) times daily with a meal.   Yes [provider]  DULoxetine (CYMBALTA) 60 MG capsule TAKE 1 CAPSULE BY MOUTH  DAILY 10/29/20  Yes Koberlein, Junell C, MD  Erenumab-aooe (AIMOVIG) 140 MG/ML SOAJ INJECT 140 MG INTO THE SKIN EVERY 30 (THIRTY) DAYS. 03/01/20  Yes [provider]  gabapentin (NEURONTIN) 300 MG capsule Take 300-900 mg by mouth See admin instructions. Take 1 tablet every AM, three tablets at bedtime 01/03/18  Yes [provider]  montelukast (SINGULAIR) 10 MG tablet Take 1 tablet (10 mg total) by mouth daily. 06/29/20  Yes Garnet Sierras, DO  Multiple Vitamin (MULTIVITAMIN WITH MINERALS) TABS tablet Take 1 tablet by mouth daily.   Yes [provider]  nirmatrelvir/ritonavir EUA (PAXLOVID) TABS Take 3 tablets by mouth 2 (two) times daily for 5 days. (Take nirmatrelvir 150 mg two tablets twice daily for 5 days and ritonavir 100 mg one tablet twice daily for 5 days) Patient GFR is 61 11/01/20 11/06/20 Yes Koberlein, Junell C, MD  pantoprazole (PROTONIX) 40 MG tablet TAKE 1 TABLET BY MOUTH 2 TIMES DAILY BEFORE A MEAL. 07/02/20  Yes Koberlein, Junell C, MD  Rimegepant Sulfate (NURTEC) 75 MG TBDP Take by mouth.   Yes [provider]  rizatriptan (MAXALT) 10 MG tablet TAKE 1 TABLET BY MOUTH ONCE AS NEEDED FOR MIGRAINE FOR UP TO 1 DOSE. MAY REPEAT IN 2 HOURS IF NEEDED 08/13/20  Yes [provider]  rosuvastatin (CRESTOR) 5 MG tablet Take 1 tablet (5 mg total) by mouth at bedtime. 10/29/20  Yes Koberlein, Steele Berg, MD  traZODone (DESYREL) 100 MG tablet Take 1-2 tablets (100-200 mg total) by  mouth at bedtime as needed for sleep. 07/02/20  Yes Koberlein, Steele Berg, MD  valACYclovir (VALTREX) 1000 MG tablet Take 1 tablet (1,000 mg total) by mouth 2 (two) times daily. 07/15/20  Yes Koberlein, Steele Berg, MD  cyclobenzaprine (FLEXERIL) 10 MG tablet Take 10 mg by mouth 3 (three) times daily as needed (migraines).    [provider]  Estradiol (YUVAFEM) 10 MCG TABS vaginal tablet Place 1 tablet (10 mcg total) vaginally  daily. 08/30/20   Koberlein, Steele Berg, MD  FASENRA 30 MG/ML SOSY SECOND SHIP: INJECT ONE SYRINGE UNDER THE SKIN AT WEEK 4 AND 8, THEN EVERY 8 WEEKS THEREAFTER. Patient taking differently: Inject 30 mg into the skin See admin instructions. Every 8 weeks 09/24/18   Garnet Sierras, DO  fluticasone [facility]) 50 MCG/ACT nasal spray Place 1 spray into both nostrils daily. 07/02/20   Billie Ruddy, MD  fluticasone (FLOVENT HFA) 110 MCG/ACT inhaler Inhale 2 puffs into the lungs 2 (two) times daily. During respiratory infections/asthma flares. 11/12/19   Garnet Sierras, DO  hydrOXYzine (ATARAX/VISTARIL) 25 MG tablet Take 25 mg by mouth 3 (three) times daily as needed.    [provider]  meloxicam (MOBIC) 15 MG tablet TAKE 1 TABLET (15 MG TOTAL) BY MOUTH DAILY. 09/27/20   Laurey Morale, MD  nystatin cream (MYCOSTATIN) Apply 1 application topically 2 (two) times daily. 06/14/20   Billie Ruddy, MD  Respiratory Therapy Supplies (FLUTTER) DEVI Use 3 times daily as directed 01/22/19   Mannam, Praveen, MD  tiZANidine (ZANAFLEX) 4 MG tablet Take 4 mg by mouth every 6 (six) hours as needed for muscle spasms.    [provider]    Allergies    Patient has no known allergies.  Review of Systems   Review of Systems  Constitutional:  Positive for chills and fatigue. Negative for fever.  HENT:  Negative for congestion.   Eyes:  Negative for visual disturbance.  Respiratory:  Positive for cough, shortness of breath and wheezing. Negative for chest tightness.   Cardiovascular:   Negative for chest pain and palpitations.  Gastrointestinal:  Negative for abdominal pain, constipation, diarrhea and nausea.  Genitourinary:  Negative for dysuria.  Musculoskeletal:  Negative for back pain and neck pain.  Skin:  Negative for wound.  Neurological:  Positive for light-headedness. Negative for dizziness and headaches.  Psychiatric/Behavioral:  Negative for agitation.   All other systems reviewed and are negative.  Physical Exam Updated Vital Signs BP (!) 149/92 (BP Location: Left Arm)   Pulse (!) 106   Temp 98.4 F (36.9 C) (Oral)   Resp 18   Ht 5\' 5"  (1.651 m)   Wt 113.4 kg   LMP 12/20/2018   SpO2 97%   BMI 41.60 kg/m   Physical Exam Vitals and nursing note reviewed.  Constitutional:      General: She is not in acute distress.    Appearance: She is well-developed. She is not ill-appearing, toxic-appearing or diaphoretic.  HENT:     Head: Normocephalic and atraumatic.     Nose: No congestion or rhinorrhea.     Mouth/Throat:     Mouth: Mucous membranes are moist.     Pharynx: No oropharyngeal exudate or posterior oropharyngeal erythema.  Eyes:     Extraocular Movements: Extraocular movements intact.     Conjunctiva/sclera: Conjunctivae normal.     Pupils: Pupils are equal, round, and reactive to light.  Cardiovascular:     Rate and Rhythm: Normal rate and regular rhythm.     Heart sounds: No murmur heard. Pulmonary:     Effort: Pulmonary effort is normal. No respiratory distress.     Breath sounds: No wheezing, rhonchi or rales.  Chest:     Chest wall: No tenderness.  Abdominal:     Palpations: Abdomen is soft.     Tenderness: There is no abdominal tenderness.  Musculoskeletal:     Cervical back: Neck supple. No tenderness.  Skin:  General: Skin is warm and dry.     Capillary Refill: Capillary refill takes less than 2 seconds.     Findings: No erythema.  Neurological:     General: No focal deficit present.     Mental Status: She is alert.  Mental status is at baseline.  Psychiatric:        Mood and Affect: Mood normal.    ED Results / Procedures / Treatments   Labs (all labs ordered are listed, but only abnormal results are displayed) Labs Reviewed  COMPREHENSIVE METABOLIC PANEL - Abnormal; Notable for the following components:      Result Value   Potassium 3.4 (*)    All other components within normal limits  CBC WITH DIFFERENTIAL/PLATELET  LACTIC ACID, PLASMA    EKG EKG Interpretation  Date/Time:  Thursday November 04 2020 10:34:23 EDT Ventricular Rate:  86 PR Interval:  165 QRS Duration: 108 QT Interval:  382 QTC Calculation: 457 R Axis:   -17 Text Interpretation: Sinus rhythm Borderline left axis deviation Low voltage, precordial leads Baseline wander in lead(s) V5 When compared to prior, more wandering baseline. No STEMI Confirmed by Antony Blackbird [phone]) on 11/04/2020 12:18:02 PM  Radiology DG Chest Portable 1 View  Result Date: 11/04/2020 CLINICAL DATA:  55 year old female positive COVID-19. Cough and shortness of breath. EXAM: PORTABLE CHEST 1 VIEW COMPARISON:  Chest radiographs 12/20/2019 and earlier. FINDINGS: Portable AP upright view at 1025 hours. Chronic elevation of the right hemidiaphragm compatible with phrenic nerve palsy. But improved lung volumes compared to last year. Mediastinal contours are stable and within normal limits. Allowing for portable technique the lungs are clear. Visualized tracheal air column is within normal limits. No pneumothorax. No acute osseous abnormality identified. IMPRESSION: Chronic right phrenic nerve palsy. No acute cardiopulmonary abnormality. Electronically Signed   By: Genevie Ann M.D.   On: 11/04/2020 10:48    Procedures Procedures   Medications Ordered in ED Medications - No data to display  ED Course  I have reviewed the triage vital signs and the nursing notes.  Pertinent labs & imaging results that were available during my care of the patient were reviewed by me  and considered in my medical decision making (see chart for details).    MDM Rules/Calculators/A&P                          Alyssa Clay is a 55 y.o. female with a past medical history significant for asthma, right-sided diaphragm paralysis, GERD, chronic back pain, and recent diagnosis of COVID-19 who presents with continued cough, shortness of breath, and some lightheadedness.  Patient reports that she has had waxing waning symptoms since being diagnosed with COVID 4 days ago.  She reports she is still having a loud cough and was feeling lightheaded.  She says that she has been taking her medications to help with her asthma and she feels her wheezing is improved but she is still having the cough and shortness of breath.  She is concerned that it could be the COVID worsening.  She reports no syncopal episodes but reports feeling lightheaded.  She reports no significant nausea, vomiting, or current diarrhea.  She denies any chest pain or palpitations.  Denies any abdominal pain.  On exam, lungs are remarkably clear without significant rhonchi or rales or wheezing.  Chest was nontender, abdomen nontender, back nontender on my exam.  Patient is slightly tachycardic on my exam but is otherwise not hypoxic.  Oxygen saturations are in the 90s.  Patient does report that at home several times she had oxygen saturations that dipped into the 80s but it was brief.  She denies any new leg pain or leg swelling he denies any chest pain, she has low suspicion for pulmonary embolism and DVT and I agree based on her exam and symptoms.  Patient will get some screening labs to make sure there is not significant electrolyte abnormalities given the lightheadedness in the setting of COVID however we will get a chest x-ray to look for development of pneumonia or other acute abnormality.  Suspect this is her COVID causing her symptoms as opposed to her asthma given her reassuring lungs.  If work-up is reassuring,  anticipate PCP follow-up.  Work-up overall reassuring.  Mild hypokalemia we discussed.  No evidence of pneumonia.  Patient did not have hypoxia during her stay here for over 6 hours.  Had a discussion and although she had some transient hypoxia at home, we do feel she is safe for discharge home and she agrees.  Patient requested something to help with her asthma exacerbation and will give a burst of steroids for the next 5 days.  She will follow-up with her PCP and continue outpatient management.  She had other questions or concerns and was discharged in good condition with understanding return precautions.   Final Clinical Impression(s) / ED Diagnoses Final diagnoses:  COVID  Cough  Shortness of breath  Fatigue, unspecified type  Asthma, unspecified asthma severity, unspecified whether complicated, unspecified whether persistent    Rx / DC Orders ED Discharge Orders          Ordered    predniSONE (DELTASONE) 50 MG tablet  Daily        11/04/20 1552           Clinical Impression: 1. COVID   2. Cough   3. Shortness of breath   4. Fatigue, unspecified type   5. Asthma, unspecified asthma severity, unspecified whether complicated, unspecified whether persistent     Disposition: Discharge  Condition: Good  I have discussed the results, Dx and Tx plan with the pt(& family if present). He/she/they expressed understanding and agree(s) with the plan. Discharge instructions discussed at great length. Strict return precautions discussed and pt &/or family have verbalized understanding of the instructions. No further questions at time of discharge.    New Prescriptions   PREDNISONE (DELTASONE) 50 MG TABLET    Take 1 tablet (50 mg total) by mouth daily for 5 days.    Follow Up: Caren Macadam, MD [place] [address] [phone]     [facility] [place]  [id number] [phone]       Mikeria Valin, Gwenyth Allegra, MD 11/04/20 1556

## 2020-11-04 NOTE — ED Notes (Signed)
Speaking in complete sentences, harsh bronchial cough, last HFA 45 min PTA.  Has nebulizer equipment at home without SABA solution.

## 2020-11-04 NOTE — ED Notes (Signed)
Bedside commode placed in pt's room 

## 2020-11-04 NOTE — ED Notes (Signed)
Ambulated in room, SpO2 92-98%, HR max 115 after 1 minute, denies DOE, noted some increased dizziness.

## 2020-11-04 NOTE — Discharge Instructions (Addendum)
Your history, exam, today are consistent with COVID-19 causing worsened cough, shortness of breath, and fatigue.  Your vital signs remained reassuring and your work-up was also reassuring however given your history of asthma and the wheezing earlier, it is reasonable to give you a burst of steroids for the next few days.  Please continue to take deep breaths and follow-up with your primary doctor.  Please rest.  If any symptoms change or worsen, please turn to the nearest emergency department.

## 2020-11-04 NOTE — ED Triage Notes (Signed)
Pt reports she tested positive for Covid on Monday, 7/11.  Started Paxlovid.  Reports having several "asthma attacks" yesterday and last evening. She uses Albuterol and Symbicort for asthma.  Last treatment was approximately 45 minutes ago. Started feeling nauseous and dizzy yesterday so she stopped Paxlovid.

## 2020-11-04 NOTE — Telephone Encounter (Signed)
Received call from [facility] access nurse 11/03/20.  Was diagnosed with covid.  Has a history of asthma.  On paxlovid.  Has been having increased cough, congestion, dizziness and nausea.  Pulse ox 93% prior to inhaler 96% after.  Given worsening symptoms, discussed need for in person evaluation.  Recommended evaluation 7/13 pm.  Called to check on her this morning.  She has been having to sleep in recliner.  Not able to breathe if lying flat.  Head stopped up.  Increased congestion head and chest.  Not feeling well.  Discussed need for in person evaluation to better determine if other treatment warranted.  Made need cxr, etc.  She plans to be seen today.  Informed her I would forward information to you.

## 2020-11-05 NOTE — Telephone Encounter (Signed)
Patient went to ER yesterday; note reviewed.

## 2020-11-09 ENCOUNTER — Encounter: Payer: Self-pay | Admitting: Internal Medicine

## 2020-11-09 ENCOUNTER — Telehealth (INDEPENDENT_AMBULATORY_CARE_PROVIDER_SITE_OTHER): Payer: 59 | Admitting: Internal Medicine

## 2020-11-09 VITALS — Ht 65.0 in | Wt 250.0 lb

## 2020-11-09 DIAGNOSIS — J4550 Severe persistent asthma, uncomplicated: Secondary | ICD-10-CM

## 2020-11-09 DIAGNOSIS — J019 Acute sinusitis, unspecified: Secondary | ICD-10-CM | POA: Diagnosis not present

## 2020-11-09 DIAGNOSIS — U071 COVID-19: Secondary | ICD-10-CM | POA: Diagnosis not present

## 2020-11-09 MED ORDER — AMOXICILLIN-POT CLAVULANATE 875-125 MG PO TABS: 1.0000 | ORAL_TABLET | Freq: Two times a day (BID) | ORAL | 0 refills | Status: DC

## 2020-11-09 MED ORDER — FLUCONAZOLE 150 MG PO TABS: 150.0000 mg | ORAL_TABLET | Freq: Once | ORAL | 0 refills | Status: AC

## 2020-11-09 NOTE — Progress Notes (Signed)
Virtual Visit via Video Note  I connected withNAME@ on 11/09/20 at  1:30 PM EDT by a video enabled telemedicine application and verified that I am speaking with the correct person using two identifiers. Location patient: home Location provider:work office Persons participating in the virtual visit: patient, provider  [place]th national recommendations  regarding COVID 19 pandemic   video visit is advised over in office visit for this patient.  Patient aware  of the limitations of evaluation and management by telemedicine and  availability of in person appointments. and agreed to proceed.   HPI: Alyssa Clay presents for video visit PCP appointment not available. Patient has underlying history of asthma on controller medication and developed a positive COVID-19 test husband first. Developed cough aggravation of her asthma had to seek ED visit and was also given pack Slo-Bid on the second day.  She was only able to take 3 days because of side effects made her dizzy. She was treated with 5 days of prednisone in addition to her ongoing twice daily Symbicort and every 4 hours albuterol to keep her chest loose. 2 days ago she felt significantly better was able to do housework no fever asthma stable. Then next day began to have significant head sinus nasal congestion and facial pressure unrelieved and now copious thick yellow discharge like a sinus infection. Pulse ox remained 95 and above and she continues her asthma regimen. No fever.  No hemoptysis.  ROS: See pertinent positives and negatives per HPI.  Past Medical History:  Diagnosis Date   Allergy    Anemia    Asthma    Constipation    issues x the past month -? 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   Enlarged ovary 09/15/2020   noted on ct scan, per pt   FUO (fever of unknown origin) 10/21/2019   GERD (gastroesophageal reflux disease)    Hypertension     PAST hx    Migraine     Past Surgical History:  Procedure Laterality Date   APPENDECTOMY     BREAST REDUCTION SURGERY Bilateral 06/12/2019   Procedure: BREAST REDUCTION WITH LIPOSUCTION;  Surgeon: Wallace Going, DO;  Location: [place];  Service: Plastics;  Laterality: Bilateral;  4 hours, please   [place] AND CURETTAGE OF UTERUS     multiple due to miscarriages   KNEE ARTHROSCOPY Left 2013   LAPAROSCOPIC APPENDECTOMY N/A 12/08/2018   Procedure: APPENDECTOMY LAPAROSCOPIC;  Surgeon: Donnie Mesa, MD;  Location: [place];  Service: General;  Laterality: N/A;   TENNIS ELBOW RELEASE/NIRSCHEL PROCEDURE  2009   WISDOM TOOTH EXTRACTION      Family History  Problem Relation Age of Onset   Depression Mother    Lymphoma Mother 61   Hypertension Father    Heart attack Father 45   Hypertension Brother    High blood pressure Brother    High Cholesterol Brother    Healthy Daughter    Colon cancer Maternal Aunt 68   Esophageal cancer Neg Hx    Pancreatic cancer Neg Hx    Stomach cancer Neg Hx    Colon polyps Neg Hx    Rectal cancer Neg Hx     Social History   Tobacco Use   Smoking status: Never   Smokeless tobacco: Never  Vaping Use   Vaping Use: Never used  Substance Use Topics   Alcohol use: Yes    Comment: occ 2-3 beers a month    Drug use: Never      Current Outpatient Medications:    albuterol (PROAIR HFA) 108 (90 Base) MCG/ACT inhaler, Inhale 2 puffs into the lungs every 4 (four) hours as needed for wheezing or shortness of breath., Disp: 1 each, Rfl: 1   albuterol (PROVENTIL) (2.5 MG/3ML) 0.083% nebulizer solution, Take 2.5 mg by nebulization every 6 (six) hours as needed for wheezing or shortness of breath., Disp: , Rfl:    amoxicillin-clavulanate (AUGMENTIN) 875-125 MG tablet, Take 1 tablet by mouth every 12 (twelve) hours. For sinusitis, Disp: 14 tablet, Rfl: 0   budesonide-formoterol (SYMBICORT) 160-4.5 MCG/ACT  inhaler, USE 2 INHALATIONS BY MOUTH  TWICE DAILY, Disp: 30.6 g, Rfl: 3   buPROPion (WELLBUTRIN XL) 150 MG 24 hr tablet, Take 1 tablet (150 mg total) by mouth daily., Disp: 90 tablet, Rfl: 1   calcium carbonate (OSCAL) 1500 (600 Ca) MG TABS tablet, Take 600 mg of elemental calcium by mouth 2 (two) times daily with a meal., Disp: , Rfl:    cyclobenzaprine (FLEXERIL) 10 MG tablet, Take 10 mg by mouth 3 (three) times daily as needed (migraines)., Disp: , Rfl:    DULoxetine (CYMBALTA) 60 MG capsule, TAKE 1 CAPSULE BY MOUTH  DAILY, Disp: 90 capsule, Rfl: 3   Erenumab-aooe (AIMOVIG) 140 MG/ML SOAJ, INJECT 140 MG INTO THE SKIN EVERY 30 (THIRTY) DAYS., Disp: , Rfl:    Estradiol (YUVAFEM) 10 MCG TABS vaginal tablet, Place 1 tablet (10 mcg total) vaginally daily., Disp: 8 tablet, Rfl: 0   FASENRA 30 MG/ML SOSY, SECOND SHIP: INJECT ONE SYRINGE UNDER THE SKIN AT WEEK 4 AND 8, THEN EVERY 8 WEEKS THEREAFTER. (Patient taking differently: Inject 30 mg into the skin See admin instructions. Every 8 weeks), Disp: 1 Syringe, Rfl: 8   fluconazole (DIFLUCAN) 150 MG tablet, Take 1 tablet (150 mg total) by mouth once for 1 dose., Disp: 1 tablet, Rfl: 0   fluticasone (FLONASE) 50 MCG/ACT nasal spray, Place 1 spray into both nostrils daily., Disp: 16 g, Rfl: 0   fluticasone (FLOVENT HFA) 110 MCG/ACT inhaler, Inhale 2 puffs into the lungs 2 (two) times daily. During respiratory infections/asthma flares., Disp: 1 Inhaler, Rfl: 5   gabapentin (NEURONTIN) 300 MG capsule, Take 300-900 mg by mouth See admin instructions. Take 1 tablet every AM, three tablets at bedtime, Disp: , Rfl:    hydrOXYzine (ATARAX/VISTARIL) 25 MG tablet, Take 25 mg by mouth 3 (three) times daily as needed., Disp: , Rfl:    meloxicam (MOBIC) 15 MG tablet, TAKE 1 TABLET (15 MG TOTAL) BY MOUTH DAILY., Disp: 30 tablet, Rfl: 1   montelukast (SINGULAIR) 10 MG tablet, Take 1 tablet (10 mg total) by mouth daily., Disp: 30 tablet, Rfl: 5   Multiple Vitamin  (MULTIVITAMIN WITH MINERALS) TABS tablet, Take 1 tablet by mouth daily., Disp: , Rfl:    nystatin cream (MYCOSTATIN), Apply 1 application topically 2 (two) times daily., Disp: 30 g, Rfl: 0   pantoprazole (PROTONIX) 40 MG tablet, TAKE 1 TABLET BY MOUTH 2 TIMES DAILY BEFORE A MEAL., Disp: 180 tablet, Rfl: 1   predniSONE (DELTASONE) 50 MG tablet, Take 1 tablet (50 mg total) by mouth daily for 5 days., Disp: 5 tablet, Rfl: 0   Respiratory Therapy Supplies (FLUTTER) DEVI, Use 3 times daily as directed, Disp: 1 each, Rfl: 0   Rimegepant Sulfate (NURTEC) 75 MG TBDP, Take by mouth., Disp: ,  Rfl:    rizatriptan (MAXALT) 10 MG tablet, TAKE 1 TABLET BY MOUTH ONCE AS NEEDED FOR MIGRAINE FOR UP TO 1 DOSE. MAY REPEAT IN 2 HOURS IF NEEDED, Disp: , Rfl:    rosuvastatin (CRESTOR) 5 MG tablet, Take 1 tablet (5 mg total) by mouth at bedtime., Disp: 90 tablet, Rfl: 3   tiZANidine (ZANAFLEX) 4 MG tablet, Take 4 mg by mouth every 6 (six) hours as needed for muscle spasms., Disp: , Rfl:    traZODone (DESYREL) 100 MG tablet, Take 1-2 tablets (100-200 mg total) by mouth at bedtime as needed for sleep., Disp: 180 tablet, Rfl: 3   valACYclovir (VALTREX) 1000 MG tablet, Take 1 tablet (1,000 mg total) by mouth 2 (two) times daily., Disp: 180 tablet, Rfl: 1  Current Facility-Administered Medications:    0.9 %  sodium chloride infusion, 500 mL, Intravenous, Once, [place], MD  EXAM: BP Readings from Last 3 Encounters:  11/04/20 122/70  10/13/20 (!) 159/85  10/08/20 140/80    VITALS per patient if applicable:  GENERAL: alert, oriented, appears well and in no acute distress no edema of face intermittent loose phlegmy sounding cough.  No respiratory distress and nontoxic appears to have upper respiratory congestion  HEENT: atraumatic, conjunttiva clear, no obvious abnormalities on inspection of external nose and ears Oddly tender maxillary to touch. NECK: normal movements of the head and neck  LUNGS: on  inspection no signs of respiratory distress, breathing rate appears normal, no obvious gross SOB, gasping or wheezing  CV: no obvious cyanosis  MS: moves all visible extremities without noticeable abnormality  PSYCH/NEURO: pleasant and cooperative, no obvious depression or anxiety, speech and thought processing grossly intact Record review ED visit previous visits.  ASSESSMENT AND PLAN:  Discussed the following assessment and plan:    ICD-10-CM   1. Acute non-recurrent sinusitis, unspecified location  J01.90     2. COVID-19  U07.1     3. Severe persistent asthma, unspecified whether complicated  I01.65      Pulmonary status seems stable using asthma controllers and bronchodilators.  Has finished her prednisone. Appears to have relapsing symptoms of upper respiratory sinusitis 10 days into the illness. Empiric treatment with antibiotics continue sinus hygiene.  She requested Diflucan because of risk of vaginal yeast yeast infections when she goes on antibiotics. Augmentin twice daily for 5 to 7 days. Expectant management and plan for follow-up after the weekend if not continuing to get better. Questions addressed answered as best possible. Counseled.    Advised to call back or seek an in-person evaluation if worsening  or having  further concerns . Return if symptoms worsen or fail to improve as expected as expectedas expected.   Shanon Ace, MD

## 2020-11-13 ENCOUNTER — Encounter: Payer: Self-pay | Admitting: Family Medicine

## 2020-11-29 ENCOUNTER — Telehealth: Payer: Self-pay | Admitting: Family Medicine

## 2020-11-29 ENCOUNTER — Encounter: Payer: Self-pay | Admitting: Family Medicine

## 2020-11-29 ENCOUNTER — Ambulatory Visit (INDEPENDENT_AMBULATORY_CARE_PROVIDER_SITE_OTHER): Payer: 59 | Admitting: Family Medicine

## 2020-11-29 ENCOUNTER — Other Ambulatory Visit: Payer: Self-pay

## 2020-11-29 VITALS — BP 120/80 | HR 94 | Ht 65.0 in | Wt 252.0 lb

## 2020-11-29 DIAGNOSIS — M545 Low back pain, unspecified: Secondary | ICD-10-CM | POA: Diagnosis not present

## 2020-11-29 DIAGNOSIS — M5416 Radiculopathy, lumbar region: Secondary | ICD-10-CM

## 2020-11-29 NOTE — Progress Notes (Signed)
[place] [facility] [place] [place] Phone: [phone] Subjective:   Alyssa Clay, am serving as a scribe for Dr. Hulan Saas. This visit occurred during the SARS-CoV-2 public health emergency.  Safety protocols were in place, including screening questions prior to the visit, additional usage of staff PPE, and extensive cleaning of exam room while observing appropriate contact time as indicated for disinfecting solutions.   I'm seeing this patient by the request  of:  Caren Macadam, MD  CC: Low back pain follow-up  [id number]  10/08/2020 Patient states continues to have more of a lumbar radiculopathy.  Patient is having worsening pain and feels that meloxicam did not help.  Patient is more radicular symptoms as well.  Patient is doing a lot of conservative therapy for over a year for this back and has not made significant progress.  Attempted osteopathic manipulation as well as well as medications such as Flexeril and duloxetine.  Do feel that advanced imaging is At this time.  Patient has been very motivated and has lost weight but is unfortunately unable to lose any more weight secondary to the pain.  Unable to even pick up her dog and sometimes has difficulty with stairs.  Depending on imaging patient could be a candidate for possible epidurals.  Patient will follow up with me after imaging to discuss further.  Update 11/29/2020 Alyssa Clay is a 55 y.o. female coming in with complaint of LBP. Facet injection on 10/13/2020. Patient states that pain on R side is worse with lumbar flexion and with hip abduction. Denies any radiating symptoms. Patient has had dry needling which helps R glute pain.  Patient states initially did feel 70 to 80% better after the injections.  Ambulated patient better.     Past Medical History:  Diagnosis Date   Allergy    Anemia    Asthma    Constipation    issues x the past month -? 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 - [place] HOME 02 USE   Enlarged ovary 09/15/2020   noted on ct scan, per pt   FUO (fever of unknown origin) 10/21/2019   GERD (gastroesophageal reflux disease)    Hypertension    PAST hx    Migraine    Past Surgical History:  Procedure Laterality Date   APPENDECTOMY     BREAST REDUCTION SURGERY Bilateral 06/12/2019   Procedure: BREAST REDUCTION WITH LIPOSUCTION;  Surgeon: Wallace Going, DO;  Location: [place];  Service: Plastics;  Laterality: Bilateral;  4 hours, please   CESAREAN SECTION  1994   DILATION AND CURETTAGE OF UTERUS     multiple due to miscarriages   KNEE ARTHROSCOPY Left 2013   LAPAROSCOPIC APPENDECTOMY N/A 12/08/2018   Procedure: APPENDECTOMY LAPAROSCOPIC;  Surgeon: Donnie Mesa, MD;  Location: [place];  Service: General;  Laterality: N/A;   TENNIS ELBOW RELEASE/NIRSCHEL PROCEDURE  2009   WISDOM TOOTH EXTRACTION     Social History   Socioeconomic History   Marital status: Married    Spouse name: Not on file   Number of children: Not on file   Years of education: Not on file   Highest education level: Not on file  Occupational History   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  Tobacco Use   Smoking status: Never   Smokeless tobacco: Never  Vaping Use   Vaping Use: Never used  Substance and Sexual Activity   Alcohol use: Yes    Comment: occ 2-3 beers a month    Drug use: Never   Sexual activity: Yes    Partners: Male  Other Topics Concern   Not on file  Social History Narrative   Not on file   Social Determinants of Health   Financial Resource Strain: Not on file  Food Insecurity: Not on file  Transportation Needs: Not on file  Physical Activity: Not on file  Stress: Not on file  Social Connections: Not on file   Clay Known Allergies Family History  Problem Relation Age of Onset   Depression Mother    Lymphoma Mother 57   Hypertension Father    Heart  attack Father 71   Hypertension Brother    High blood pressure Brother    High Cholesterol Brother    Healthy Daughter    Colon cancer Maternal Aunt 65   Esophageal cancer Neg Hx    Pancreatic cancer Neg Hx    Stomach cancer Neg Hx    Colon polyps Neg Hx    Rectal cancer Neg Hx       Current Outpatient Medications (Cardiovascular):    rosuvastatin (CRESTOR) 5 MG tablet, Take 1 tablet (5 mg total) by mouth at bedtime.   Current Outpatient Medications (Respiratory):    albuterol (PROAIR HFA) 108 (90 Base) MCG/ACT inhaler, Inhale 2 puffs into the lungs every 4 (four) hours as needed for wheezing or shortness of breath.   albuterol (PROVENTIL) (2.5 MG/3ML) 0.083% nebulizer solution, Take 2.5 mg by nebulization every 6 (six) hours as needed for wheezing or shortness of breath.   budesonide-formoterol (SYMBICORT) 160-4.5 MCG/ACT inhaler, USE 2 INHALATIONS BY MOUTH  TWICE DAILY   FASENRA 30 MG/ML SOSY, SECOND SHIP: INJECT ONE SYRINGE UNDER THE SKIN AT WEEK 4 AND 8, THEN EVERY 8 WEEKS THEREAFTER. (Patient taking differently: Inject 30 mg into the skin See admin instructions. Every 8 weeks)   fluticasone (FLONASE) 50 MCG/ACT nasal spray, Place 1 spray into both nostrils daily.   fluticasone (FLOVENT HFA) 110 MCG/ACT inhaler, Inhale 2 puffs into the lungs 2 (two) times daily. During respiratory infections/asthma flares.   montelukast (SINGULAIR) 10 MG tablet, Take 1 tablet (10 mg total) by mouth daily.   Current Outpatient Medications (Analgesics):    Erenumab-aooe (AIMOVIG) 140 MG/ML SOAJ, INJECT 140 MG INTO THE SKIN EVERY 30 (THIRTY) DAYS.   meloxicam (MOBIC) 15 MG tablet, TAKE 1 TABLET (15 MG TOTAL) BY MOUTH DAILY.   Rimegepant Sulfate (NURTEC) 75 MG TBDP, Take by mouth.   rizatriptan (MAXALT) 10 MG tablet, TAKE 1 TABLET BY MOUTH ONCE AS NEEDED FOR MIGRAINE FOR UP TO 1 DOSE. MAY REPEAT IN 2 HOURS IF NEEDED     Current Outpatient Medications (Other):    amoxicillin-clavulanate  (AUGMENTIN) 875-125 MG tablet, Take 1 tablet by mouth every 12 (twelve) hours. For sinusitis   buPROPion (WELLBUTRIN XL) 150 MG 24 hr tablet, Take 1 tablet (150 mg total) by mouth daily.   calcium carbonate (OSCAL) 1500 (600 Ca) MG TABS tablet, Take 600 mg of elemental calcium by mouth 2 (two) times daily with a meal.   cyclobenzaprine (FLEXERIL) 10 MG tablet, Take 10 mg by mouth 3 (three) times daily as needed (migraines).   DULoxetine (CYMBALTA) 60 MG capsule, TAKE 1 CAPSULE BY MOUTH  DAILY   Estradiol (YUVAFEM) 10 MCG TABS vaginal tablet, Place 1 tablet (10 mcg total) vaginally daily.   gabapentin (NEURONTIN) 300 MG capsule,  Take 300-900 mg by mouth See admin instructions. Take 1 tablet every AM, three tablets at bedtime   hydrOXYzine (ATARAX/VISTARIL) 25 MG tablet, Take 25 mg by mouth 3 (three) times daily as needed.   Multiple Vitamin (MULTIVITAMIN WITH MINERALS) TABS tablet, Take 1 tablet by mouth daily.   nystatin cream (MYCOSTATIN), Apply 1 application topically 2 (two) times daily.   pantoprazole (PROTONIX) 40 MG tablet, TAKE 1 TABLET BY MOUTH 2 TIMES DAILY BEFORE A MEAL.   Respiratory Therapy Supplies (FLUTTER) DEVI, Use 3 times daily as directed   tiZANidine (ZANAFLEX) 4 MG tablet, Take 4 mg by mouth every 6 (six) hours as needed for muscle spasms.   traZODone (DESYREL) 100 MG tablet, Take 1-2 tablets (100-200 mg total) by mouth at bedtime as needed for sleep.   valACYclovir (VALTREX) 1000 MG tablet, Take 1 tablet (1,000 mg total) by mouth 2 (two) times daily.  Current Facility-Administered Medications (Other):    0.9 %  sodium chloride infusion   Reviewed prior external information including notes and imaging from  primary care provider As well as notes that were available from care everywhere and other healthcare systems.  Past medical history, social, surgical and family history all reviewed in electronic medical record.  Clay pertanent information unless stated regarding to the  chief complaint.   Review of Systems:  Clay headache, visual changes, nausea, vomiting, diarrhea, constipation, dizziness, abdominal pain, skin rash, fevers, chills, night sweats, weight loss, swollen lymph nodes, body aches, joint swelling, chest pain, shortness of breath, mood changes. POSITIVE muscle aches  Objective  Blood pressure 120/80, pulse 94, height '5\' 5"'$  (1.651 m), weight 252 lb (114.3 kg), last menstrual period 12/20/2018, SpO2 96 %.   General: Clay apparent distress alert and oriented x3 mood and affect normal, dressed appropriately.  HEENT: Pupils equal, extraocular movements intact  Respiratory: Patient's speak in full sentences and does not appear short of breath  Cardiovascular: Clay lower extremity edema, non tender, Clay erythema  Gait normal with good balance and coordination.  MSK:  Non tender with full range of motion and good stability and symmetric strength and tone of shoulders, elbows, wrist, hip, knee and ankles bilaterally.  Low back exam still has significant tightness noted in the lumbar spine.  Worsening pain with any extension of the back.  Does have tightness of the straight leg.   Impression and Recommendations:    The above documentation has been reviewed and is accurate and complete Lyndal Pulley, DO

## 2020-11-29 NOTE — Assessment & Plan Note (Signed)
Patient has had difficulty with this previously.  Does have some facet arthritis.  Discussed icing regimen and home exercises.  Discussed topical anti-inflammatories.  Patient did respond well to the facet injections previously and I would encourage her to try this again.  Patient if she does get another 70% or greater improvement from it.  Be a candidate for possible radiofrequency ablation.  Patient will follow up with me again in 6 weeks.  No change in medications including the Cymbalta and the Flexeril.

## 2020-11-29 NOTE — Patient Instructions (Signed)
[facility] [phone] See me 6 weeks after injection If comes back will consider RFA

## 2020-11-29 NOTE — Telephone Encounter (Signed)
Patient called stating that her face is breaking out more than usual and that she is having some rosacea. She is wanting Dr. Ethlyn Gallery to recommend her to a dermatologist ASAP.  Patient's contact number is [phone].  Please advise.

## 2020-12-01 ENCOUNTER — Ambulatory Visit
Admission: RE | Admit: 2020-12-01 | Discharge: 2020-12-01 | Disposition: A | Discharge status: Home / Self Care | Payer: 59 | Source: Ambulatory Visit | Attending: Family Medicine | Admitting: Family Medicine

## 2020-12-01 ENCOUNTER — Ambulatory Visit: Payer: 59 | Admitting: Family Medicine

## 2020-12-01 DIAGNOSIS — M545 Low back pain, unspecified: Secondary | ICD-10-CM

## 2020-12-01 IMAGING — XA DG FACET JT INJ L OR S SPINE SINGLE LEVEL UNI
2 series · 2 of 2 positions shown · non-contrast
Comparison: none

CLINICAL DATA: Lumbar spine pain. Bilateral L5-S1 facet
arthropathy. 80% relief pain with prior injection. Pain has since
recurred.

[Series 1: ortho adipose · 1 of 1 slices shown (1 of 2)]
[im 1/1]
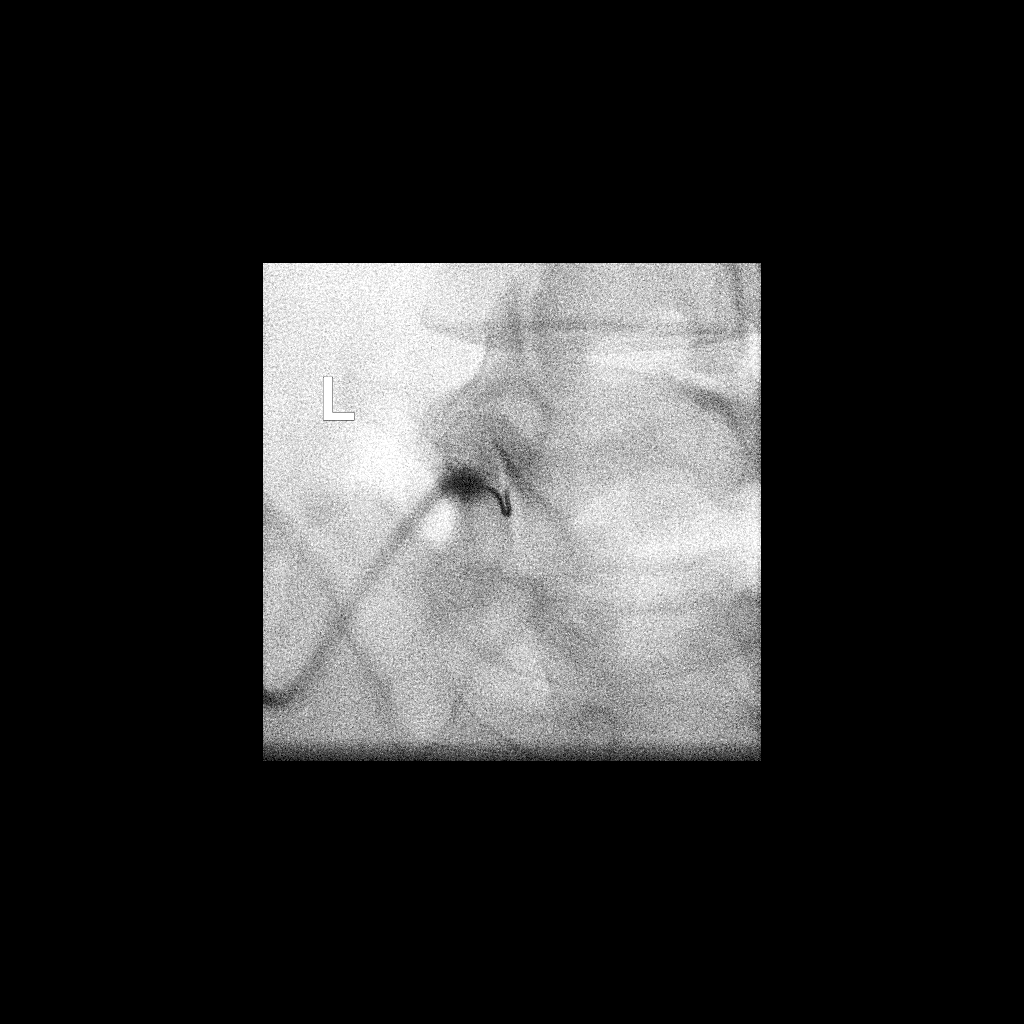

[Series 2: ortho adipose · 1 of 1 slices shown (2 of 2)]
[im 1/1]
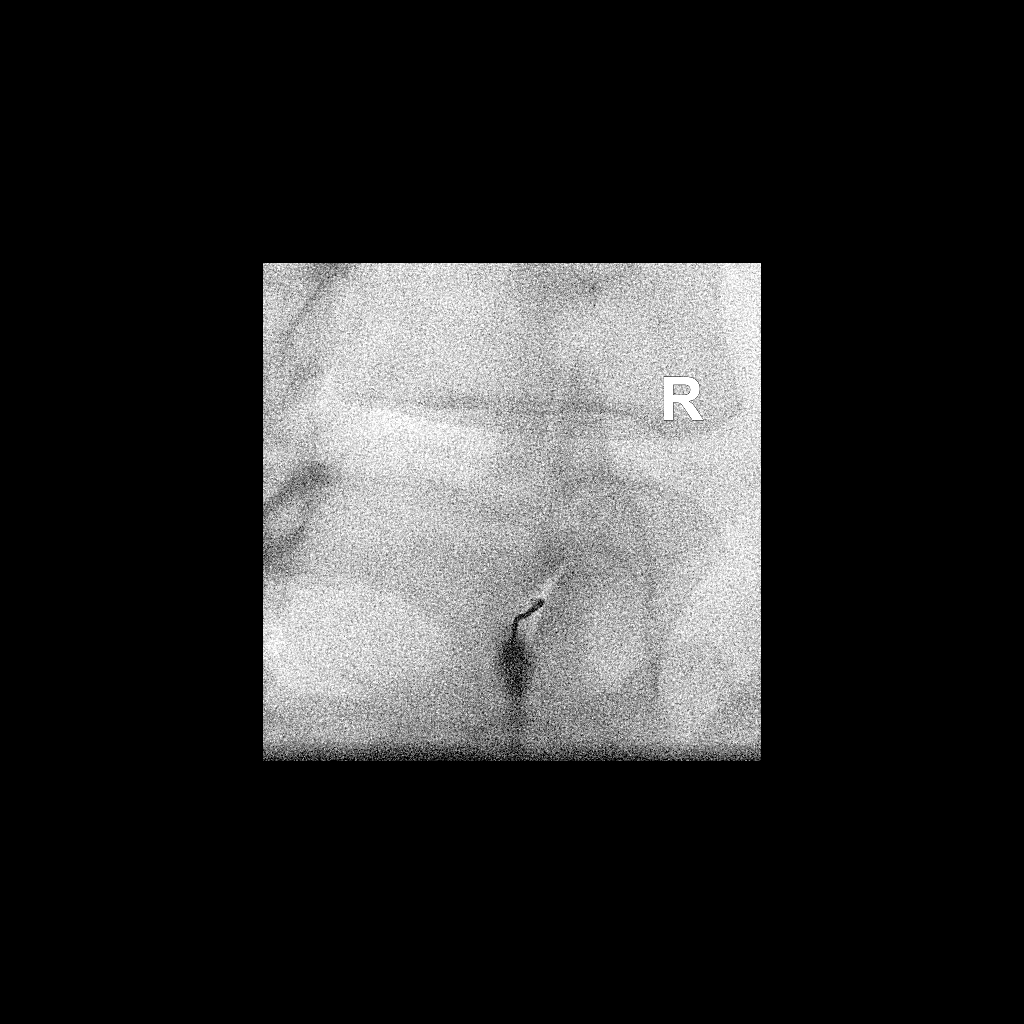

[2 of 2 positions shown; findings below may reference images not displayed]

FLUOROSCOPY TIME:  Radiation Exposure Index (as provided by the
fluoroscopic device): 61.06 scratched at 61.05 uGy*m2

PROCEDURE:
DG FACET JT INJ L OR S SPINE SINGLE LEVEL UNILATERAL

The procedure, risks, benefits, and alternatives were explained to
the patient. Questions regarding the procedure were encouraged and
answered. The patient understands and consents to the procedure.

Left L5-[A2] INJECTION: A posterior oblique approach was taken to
the facet on the left at L5-S1 using a curved 22 gauge spinal
needle. Intra-articular positioning was confirmed by injecting a
small amount of Isovue-M 200. No vascular opacification is seen.
15mg of Depo-Medrol mixed with 1 mL lidocaine were instilled into
the joint. The injection resulted in concordant pain.

Right L5-[A2] INJECTION: A posterior oblique approach was taken
to the facet on the right at L5-S1 using a curved 22 gauge spinal
needle. Intra-articular positioning was confirmed by injecting a
small amount of Isovue-M 200. No vascular opacification is seen.
25mg of Depo-Medrol mixed with 1.5 ML lidocaine were instilled into
the joint. The injection resulted in concordant pain.

The procedure was well-tolerated.
IMPRESSION: Technically successful bilateral L5 through S1 facet injections. [DATE]
of the dose was used on the right and [DATE] on the left.

## 2020-12-01 IMAGING — XA DG FACET JT INJ L OR S SPINE SINGLE LEVEL UNI
2 series · 2 of 2 positions shown · non-contrast
Comparison: none

CLINICAL DATA: Lumbar spine pain. Bilateral L5-S1 facet
arthropathy. 80% relief pain with prior injection. Pain has since
recurred.

[Series 1: ortho adipose · 1 of 1 slices shown (1 of 2)]
[im 1/1]
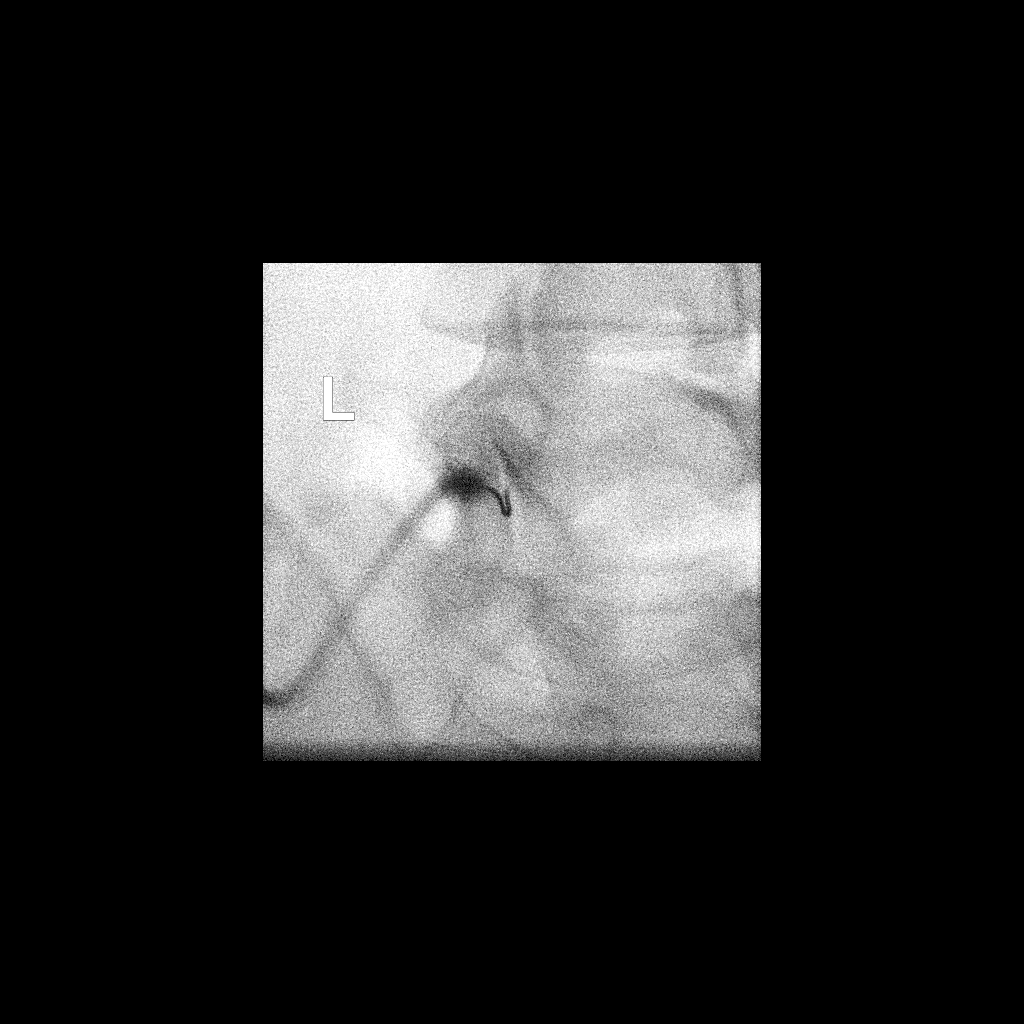

[Series 2: ortho adipose · 1 of 1 slices shown (2 of 2)]
[im 1/1]
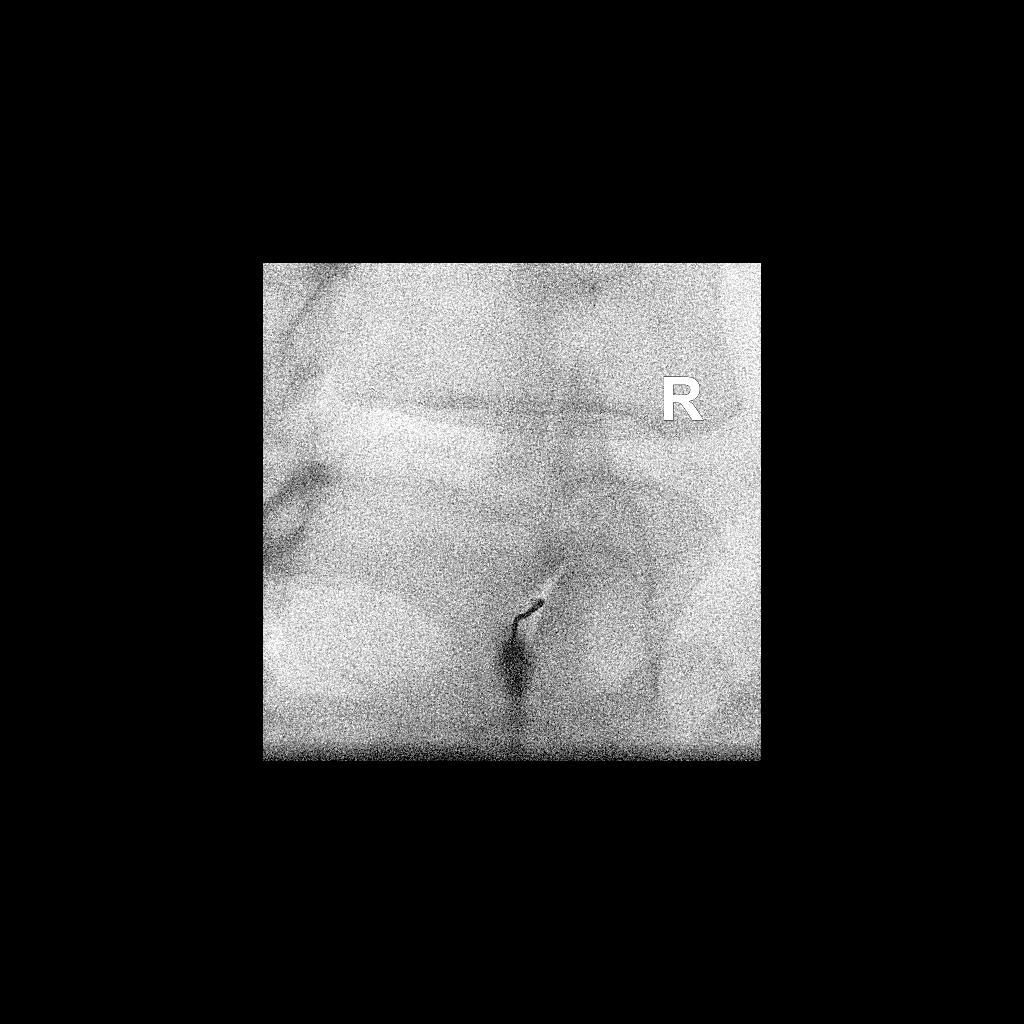

[2 of 2 positions shown; findings below may reference images not displayed]

FLUOROSCOPY TIME:  Radiation Exposure Index (as provided by the
fluoroscopic device): 61.06 scratched at 61.05 uGy*m2

PROCEDURE:
DG FACET JT INJ L OR S SPINE SINGLE LEVEL UNILATERAL

The procedure, risks, benefits, and alternatives were explained to
the patient. Questions regarding the procedure were encouraged and
answered. The patient understands and consents to the procedure.

Left L5-[A2] INJECTION: A posterior oblique approach was taken to
the facet on the left at L5-S1 using a curved 22 gauge spinal
needle. Intra-articular positioning was confirmed by injecting a
small amount of Isovue-M 200. No vascular opacification is seen.
15mg of Depo-Medrol mixed with 1 mL lidocaine were instilled into
the joint. The injection resulted in concordant pain.

Right L5-[A2] INJECTION: A posterior oblique approach was taken
to the facet on the right at L5-S1 using a curved 22 gauge spinal
needle. Intra-articular positioning was confirmed by injecting a
small amount of Isovue-M 200. No vascular opacification is seen.
25mg of Depo-Medrol mixed with 1.5 ML lidocaine were instilled into
the joint. The injection resulted in concordant pain.

The procedure was well-tolerated.
IMPRESSION: Technically successful bilateral L5 through S1 facet injections. [DATE]
of the dose was used on the right and [DATE] on the left.

## 2020-12-01 MED ORDER — METHYLPREDNISOLONE ACETATE 40 MG/ML INJ SUSP (RADIOLOG
80.0000 mg | Freq: Once | INTRAMUSCULAR | Status: AC
  Administered 2020-12-01: 80 mg via INTRA_ARTICULAR

## 2020-12-01 MED ORDER — IOPAMIDOL (ISOVUE-M 200) INJECTION 41%
1.0000 mL | Freq: Once | INTRAMUSCULAR | Status: AC
  Administered 2020-12-01: 1 mL via INTRA_ARTICULAR

## 2020-12-01 NOTE — Telephone Encounter (Signed)
Suggest [facility]. I am also happy to see her/treat her for this if wait time is significant (usually is for derm)

## 2020-12-01 NOTE — Discharge Instructions (Signed)

## 2020-12-01 NOTE — Telephone Encounter (Signed)
Patient informed of the message below.  Patient stated she no longer needs the referral as she called yesterday and has an appt with the dermatologist on Friday.

## 2020-12-02 ENCOUNTER — Encounter: Payer: Self-pay | Admitting: Physical Therapy

## 2020-12-02 ENCOUNTER — Other Ambulatory Visit: Payer: Self-pay

## 2020-12-02 ENCOUNTER — Ambulatory Visit: Payer: 59 | Attending: Gastroenterology | Admitting: Physical Therapy

## 2020-12-02 DIAGNOSIS — R278 Other lack of coordination: Secondary | ICD-10-CM | POA: Diagnosis present

## 2020-12-02 DIAGNOSIS — M6281 Muscle weakness (generalized): Secondary | ICD-10-CM

## 2020-12-02 NOTE — Therapy (Signed)
[facility] [address], [place], [place], [address] Phone: [phone]   Fax:  [phone]  Physical Therapy Treatment  Patient Details  Name: Alyssa Clay MRN: [id number] Date of Birth: 01-18-1966 Referring Provider (PT): Dr. Thornton Park   Encounter Date: 12/02/2020   PT End of Session - 12/02/20 1427     Visit Number 1    Date for PT Re-Evaluation 04/03/21    Authorization Type UHC    Authorization - Visit Number 1    Authorization - Number of Visits 25    PT Start Time 1400    PT Stop Time [id number]    PT Time Calculation (min) 45 min    Activity Tolerance Patient tolerated treatment well    Behavior During Therapy [facility] for tasks assessed/performed             Past Medical History:  Diagnosis Date   Allergy    Anemia    Asthma    Constipation    issues x the past month -? 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   Enlarged ovary 09/15/2020   noted on ct scan, per pt   FUO (fever of unknown origin) 10/21/2019   GERD (gastroesophageal reflux disease)    Hypertension    PAST hx    Migraine     Past Surgical History:  Procedure Laterality Date   APPENDECTOMY     BREAST REDUCTION SURGERY Bilateral 06/12/2019   Procedure: BREAST REDUCTION WITH LIPOSUCTION;  Surgeon: Wallace Going, DO;  Location: [place];  Service: Plastics;  Laterality: Bilateral;  4 hours, please   CESAREAN SECTION  1994   DILATION AND CURETTAGE OF UTERUS     multiple due to miscarriages   KNEE ARTHROSCOPY Left 2013   LAPAROSCOPIC APPENDECTOMY N/A 12/08/2018   Procedure: APPENDECTOMY LAPAROSCOPIC;  Surgeon: Donnie Mesa, MD;  Location: [place];  Service: General;  Laterality: N/A;   TENNIS ELBOW RELEASE/NIRSCHEL PROCEDURE  2009   WISDOM TOOTH EXTRACTION      There were no vitals filed for this visit.   Subjective Assessment  - 12/02/20 1404     Subjective I had an epidural injection yesterday. I was constipated for 1 month in May and now do not have it. I am on Miralax. I am back to the gym that helps me move my bowels. Patient has vaginal pain with intercourse.    Currently in Pain? Yes    Pain Score 8     Pain Location Vagina    Pain Orientation Mid    Pain Descriptors / Indicators Discomfort    Pain Type Chronic pain    Pain Onset More than a month ago    Pain Frequency Intermittent    Aggravating Factors  deep and initial penetration vaginally    Pain Relieving Factors no penile penetration    Multiple Pain Sites No                [facility] PT Assessment - 12/02/20 0001       Assessment   Medical Diagnosis K59.00 Constipation, unspecified type    Referring Provider (PT) Dr. Thornton Park    Onset Date/Surgical Date --   2 months ago   Prior Therapy none      Precautions   Precautions None      Restrictions   Weight Bearing  Restrictions No      Balance Screen   Has the patient fallen in the past 6 months No    Has the patient had a decrease in activity level because of a fear of falling?  No    Is the patient reluctant to leave their home because of a fear of falling?  No      Home Ecologist residence      Prior Function   Level of Independence Independent    Leisure gym 5-6 times      Cognition   Overall Cognitive Status Within Functional Limits for tasks assessed      Posture/Postural Control   Posture/Postural Control No significant limitations      ROM / Strength   AROM / PROM / Strength AROM;PROM;Strength      Strength   Overall Strength Comments bil. hip strength is 5/5; when contract the abdomen will bulge the abdomen                        Pelvic Floor Special Questions - 12/02/20 0001     Prior Pregnancies Yes    Number of C-Sections 1    Currently Sexually Active Yes    Is this Painful Yes   initial and deep  pentration   Urinary Leakage No    Fecal incontinence No   no strianing at this time; had constipation 1 month ago   Skin Integrity Intact;Other    Skin Integrity other dryness    Pelvic Floor Internal Exam Patient confirms identification and approves PT to assess pelvic floor and treatment    Exam Type Vaginal    Palpation tenderness located in the intoritus, superior transeverse perineum, levator ani and obturator internist    Strength fair squeeze, definite lift                       PT Education - 12/02/20 1446     Education Details education of abdominal massage, how to perform perineal massage; education on vaginal moisturizers    Person(s) Educated Patient    Methods Explanation;Handout;Demonstration    Comprehension Verbalized understanding              PT Short Term Goals - 12/02/20 1449       PT SHORT TERM GOAL #1   Title Independent with initial HEP    Time 4    Period Months    Status New    Target Date 12/30/20      PT SHORT TERM GOAL #2   Title educated on vaginal health with moisturizers and lubricants    Time 4    Period Weeks    Status New    Target Date 12/30/20               PT Long Term Goals - 12/02/20 1545       PT LONG TERM GOAL #1   Title independent with advanced HEP for pelvic floor and core    Time 4    Period Months    Status New    Target Date 04/03/21      PT LONG TERM GOAL #2   Title understand how to work out without increasing abdominal pressure to reduce the strain on the pelvic floor    Time 4    Period Months    Status New    Target Date 04/03/21      PT  LONG TERM GOAL #3   Title able to have penile penetration vaginally with pain level 0-1/10 due to improved tissue mobility and reduction of trigger points    Time 4    Period Months    Status New    Target Date 04/03/21      PT LONG TERM GOAL #4   Title understand correct toileting technique with patient understanding how to bulge the pelvic  floor to relax the anus    Time 4    Period Months    Status New    Target Date 04/03/21                   Plan - 12/02/20 1447     Clinical Impression Statement Patient is a 55 year old female with a constipation and pain with penile penetration vaginally. Patient reports her pain level is 8/10 with initial and deep penetration. Pelvic floor strength is 3/5. She has tenderness located in bilateral levator ani, obturator internist, superior transverse perineum, perineal body and along the introitus. She has tightness in the sides of the introitus and perineal body. Patient is not able to bulge her pelvic floor when asked. She has dryness in the vulvar area and was given samples of V-magic to use daily. She has decreased mobility of her C-section scar. When patient contracts her abdomen she will bulge it out instead of bracing her abdominals. Patent would benefit from skilled therapy to improve pelvic floor coordination, improve toileting, improve tissue mobiltiy to reduce her pain with penile penetration vaginally.    Personal Factors and Comorbidities Comorbidity 2;Fitness;Sex    Comorbidities appendectomy; C-section 1994    Examination-Activity Limitations Toileting    Examination-Participation Restrictions Interpersonal Relationship    Stability/Clinical Decision Making Stable/Uncomplicated    Clinical Decision Making Low    Rehab Potential Excellent    PT Frequency 1x / week   every other week   PT Duration Other (comment)   4 months   PT Treatment/Interventions ADLs/Self Care Home Management;Biofeedback;Therapeutic activities;Therapeutic exercise;Neuromuscular re-education;Patient/family education;Manual techniques;Scar mobilization;Dry needling    PT Next Visit Plan educate on lubricants; educated on c-section scar massage; abdominal contraction, toileting, abdominal massage; manual work to the pelvic floor to reduce trigger points    PT Home Exercise Plan Access Code: [email or web address]     Consulted and Agree with Plan of Care Patient             Patient will benefit from skilled therapeutic intervention in order to improve the following deficits and impairments:  Decreased coordination, Increased fascial restricitons, Decreased activity tolerance, Decreased strength, Decreased scar mobility  Visit Diagnosis: Muscle weakness (generalized) - Plan: PT plan of care cert/re-cert  Other lack of coordination - Plan: PT plan of care cert/re-cert     Problem List Patient Active Problem List   Diagnosis Date Noted   Lumbar radiculopathy, right 10/08/2020   SI (sacroiliac) joint dysfunction 09/16/2020   Somatic dysfunction of spine, sacral 09/16/2020   OSA (obstructive sleep apnea) 12/18/2019   FUO (fever of unknown origin) 10/21/2019   S/P bilateral breast reduction 06/19/2019   Back pain 04/15/2019   Neck pain 04/15/2019   History of frequent upper respiratory infection 01/16/2019   Atelectasis 01/16/2019   Severe persistent asthma 01/16/2019   Shortness of breath 01/01/2019   Primary osteoarthritis of both knees 07/10/2018   Hypertension 03/27/2018   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 [place], [place], [address] Phone: [phone]   Fax:  [phone]  Name: Alyssa Clay MRN: [id number] Date of Birth: [birth year]

## 2020-12-02 NOTE — Patient Instructions (Addendum)
About Abdominal Massage  Abdominal massage, also called external colon massage, is a self-treatment circular massage technique that can reduce and eliminate gas and ease constipation. The colon naturally contracts in waves in a clockwise direction starting from inside the right hip, moving up toward the ribs, across the belly, and down inside the left hip.  When you perform circular abdominal massage, you help stimulate your colon's normal wave pattern of movement called peristalsis.  It is most beneficial when done after eating.  Positioning You can practice abdominal massage with oil while lying down, or in the shower with soap.  Some people find that it is just as effective to do the massage through clothing while sitting or standing.  How to Massage Start by placing your finger tips or knuckles on your right side, just inside your hip bone.  Make small circular movements while you move upward toward your rib cage.   Once you reach the bottom right side of your rib cage, take your circular movements across to the left side of the bottom of your rib cage.  Next, move downward until you reach the inside of your left hip bone.  This is the path your feces travel in your colon. Continue to perform your abdominal massage in this pattern for 10 minutes each day.     You can apply as much pressure as is comfortable in your massage.  Start gently and build pressure as you continue to practice.  Notice any areas of pain as you massage; areas of slight pain may be relieved as you massage, but if you have areas of significant or intense pain, consult with your healthcare provider.  Other Considerations General physical activity including bending and stretching can have a beneficial massage-like effect on the colon.  Deep breathing can also stimulate the colon because breathing deeply activates the same nervous system that supplies the colon.   Abdominal massage should always be used in combination with a  bowel-conscious diet that is high in the proper type of fiber for you, fluids (primarily water), and a regular exercise program.  Moisturizers They are used in the vagina to hydrate the mucous membrane that make up the vaginal canal. Designed to keep a more normal acid balance (ph) Once placed in the vagina, it will last between two to three days.  Use 2-3 times per week at bedtime  Ingredients to avoid is glycerin and fragrance, can increase chance of infection Should not be used just before sex due to causing irritation Most are gels administered either in a tampon-shaped applicator or as a vaginal suppository. They are non-hormonal.   Types of Moisturizers(internal use)  Vitamin E vaginal suppositories- Whole foods, Amazon Moist Again Coconut oil- can break down condoms Julva- (Do no use if on Tamoxifen) amazon Yes moisturizer- amazon NeuEve Silk , NeuEve Silver for menopausal or over 65 (if have severe vaginal atrophy or cancer treatments use NeuEve Silk for  1 month than move to [employer])- [employer], [place] Olive and Bee intimate cream- www.oliveandbee.com.au Mae vaginal moisturizer- Amazon Aloe    Creams to use externally on the Vulva area [employer] (good for for cancer patients that had radiation to the area)- [place] or [employer].FlyingBasics.com.br V-magic cream - amazon Julva-amazon Vital "V [place] salve ( help moisturize and help with thinning vulvar area, does have [place] by Irwin Brakeman labial moisturizer (Amazon,  Coconut or olive oil aloe   Things to  avoid in the vaginal area Do not use things to irritate the vulvar area No lotions just specialized creams for the vulva area- Neogyn, V-magic, No soaps; can use Aveeno or Calendula cleanser if needed. Must be gentle No deodorants No douches Good to sleep without underwear to let the vaginal area to air out No scrubbing: spread  the lips to let warm water rinse over labias and pat dry  [facility] [address] [place],  [address] Phone # [phone] Fax [phone]

## 2020-12-08 ENCOUNTER — Other Ambulatory Visit: Payer: Self-pay | Admitting: Allergy

## 2020-12-08 ENCOUNTER — Other Ambulatory Visit: Payer: Self-pay | Admitting: Family Medicine

## 2020-12-13 ENCOUNTER — Ambulatory Visit: Payer: 59 | Admitting: Allergy

## 2020-12-16 ENCOUNTER — Ambulatory Visit: Payer: 59 | Admitting: Physical Therapy

## 2020-12-22 ENCOUNTER — Other Ambulatory Visit: Payer: Self-pay | Admitting: Obstetrics and Gynecology

## 2020-12-22 DIAGNOSIS — R9389 Abnormal findings on diagnostic imaging of other specified body structures: Secondary | ICD-10-CM

## 2020-12-31 ENCOUNTER — Telehealth: Payer: Self-pay | Admitting: Family Medicine

## 2020-12-31 NOTE — Telephone Encounter (Signed)
Sent patient MyChart message.

## 2020-12-31 NOTE — Telephone Encounter (Signed)
Patient called stating that she has been having continued back pain in her lower back that is making it hard to walk. She had an epidural on August 10th and thinks she might be at her limit on the number that she can have? She asked what her next options would be?   Please advise.

## 2020-12-31 NOTE — Telephone Encounter (Signed)
Pt called again, advised to wait for our response.

## 2020-12-31 NOTE — Telephone Encounter (Signed)
We can either do one more round of injections, or we could send her to discuss possible radiofrequency ablation

## 2021-01-03 ENCOUNTER — Other Ambulatory Visit: Payer: Self-pay | Admitting: Family Medicine

## 2021-01-03 ENCOUNTER — Other Ambulatory Visit: Payer: Self-pay

## 2021-01-03 DIAGNOSIS — M5416 Radiculopathy, lumbar region: Secondary | ICD-10-CM

## 2021-01-03 NOTE — Telephone Encounter (Signed)
Referral placed. Patient notified. 

## 2021-01-03 NOTE — Telephone Encounter (Signed)
Spoke to [place] at [facility] regarding the order for the RFA.  She states that the patient must have a Medial Branch Block done prior to the RFA.  Dr Nelia Shi would be the one to do this as well.   Can this order be put in for her?

## 2021-01-03 NOTE — Telephone Encounter (Signed)
Order placed

## 2021-01-06 ENCOUNTER — Encounter: Payer: Self-pay | Admitting: Family Medicine

## 2021-01-12 ENCOUNTER — Other Ambulatory Visit: Payer: Self-pay | Admitting: Family Medicine

## 2021-01-12 ENCOUNTER — Other Ambulatory Visit: Payer: 59

## 2021-01-12 ENCOUNTER — Ambulatory Visit
Admission: RE | Admit: 2021-01-12 | Discharge: 2021-01-12 | Disposition: A | Discharge status: Home / Self Care | Payer: 59 | Source: Ambulatory Visit | Attending: Family Medicine | Admitting: Family Medicine

## 2021-01-12 ENCOUNTER — Other Ambulatory Visit: Payer: Self-pay

## 2021-01-12 DIAGNOSIS — M5416 Radiculopathy, lumbar region: Secondary | ICD-10-CM

## 2021-01-12 IMAGING — XA DG FACET JT INJ L OR S SPINE SINGLE LEVEL UNI
2 series · 2 of 2 positions shown · non-contrast
Comparison: none

Addendum:
CLINICAL DATA: Facet mediated low back pain, worse on the right.
Good response to prior facet injections. Right L5-S1 facet RFA
requested.
TECHNIQUE: The procedure, risks, benefits, and alternatives were explained to
the patient. Questions regarding the procedure were encouraged and
answered. The patient understands and consents to the procedure.

[Series 1: ortho standard · 1 of 1 slices shown (1 of 2)]
[im 1/1]
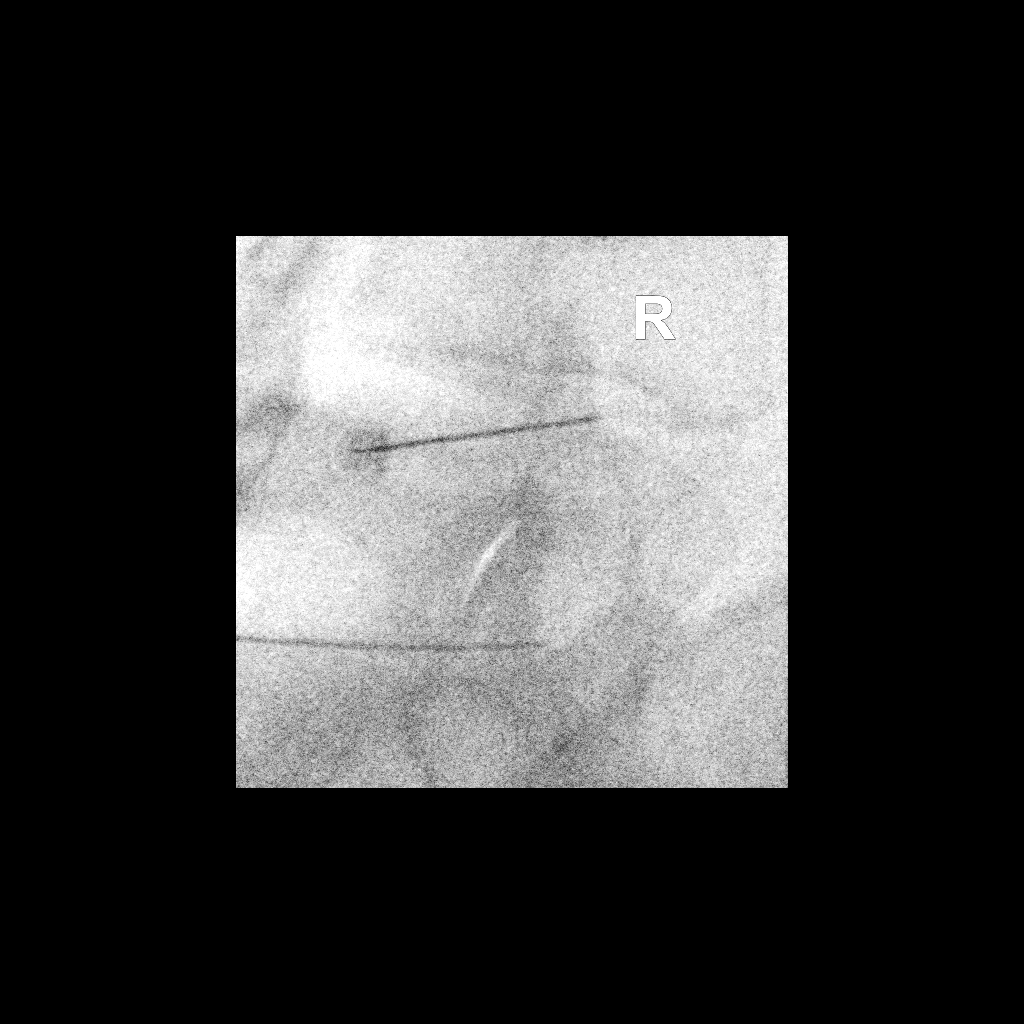

[Series 2: ortho standard · 1 of 1 slices shown (2 of 2)]
[im 1/1]
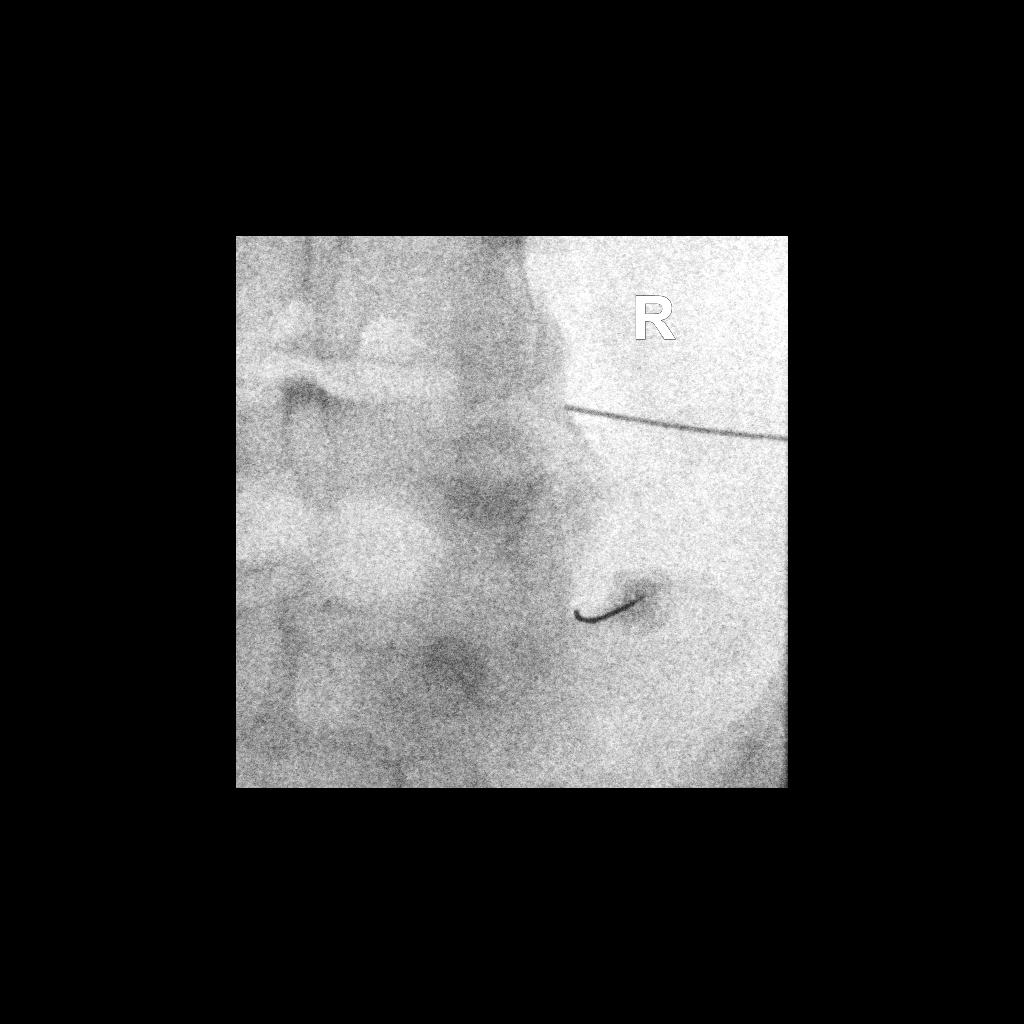

[2 of 2 positions shown; findings below may reference images not displayed]

EXAM:
FLUOROSCOPICALLY GUIDED RIGHT L4 MEDIAL BRANCH BLOCK AND RIGHT L5
DORSAL RAMUS BLOCK, IN PREPARATION FOR RIGHT L5-S1 FACET
RADIOFREQUENCY ABLATION

FLUOROSCOPY TIME:  Radiation Exposure Index (as provided by the
fluoroscopic device): 5.2 mGy

Fluoroscopy Time:  32 seconds

Number of Acquired Images:  0
An appropriate skin entry site was determined fluoroscopically and
marked. Site prepped with betadine, draped in usual sterile fashion,
and infiltrated locally with 1% lidocaine.

RIGHT L4 MEDIAL BRANCH BLOCK: A posterior oblique approach was taken
to the junction of the superior articular process and transverse
process on the right at L5 using a 5 inch 22 gauge spinal needle, to
lie along the course of the right L4 medial branch nerve. 0.5 mL of
0.5% bupivacaine were injected.

RIGHT L5 DORSAL RAMUS BLOCK: A posterior oblique approach was taken
to the junction of the S1 superior articular process and sacral ala
on the right using a 5 inch 22 gauge spinal needle, to lie along the
course of the right L5 dorsal ramus. 0.5 mL of 0.5% bupivacaine were
injected.

The procedure was well-tolerated.
IMPRESSION: 1. Technically successful medial branch/dorsal ramus blocks in
preparation for right L5-S1 facet radiofrequency ablation.

I will call the patient later today to assess for durable and
sustained pain relief. An addendum will be placed at that time.

ADDENDUM:
I called the patient at 4 p.m., approximately 7 hours after the
procedure. She has experienced approximately 80% relief in her facet
mediated pain since the procedure. She is therefore a good candidate
for radiofrequency ablation and will be scheduled at her earliest
convenience.

*** End of Addendum ***
EXAM:
FLUOROSCOPICALLY GUIDED RIGHT L4 MEDIAL BRANCH BLOCK AND RIGHT L5
DORSAL RAMUS BLOCK, IN PREPARATION FOR RIGHT L5-S1 FACET
RADIOFREQUENCY ABLATION

FLUOROSCOPY TIME:  Radiation Exposure Index (as provided by the
fluoroscopic device): 5.2 mGy

Fluoroscopy Time:  32 seconds

Number of Acquired Images:  0
An appropriate skin entry site was determined fluoroscopically and
marked. Site prepped with betadine, draped in usual sterile fashion,
and infiltrated locally with 1% lidocaine.

RIGHT L4 MEDIAL BRANCH BLOCK: A posterior oblique approach was taken
to the junction of the superior articular process and transverse
process on the right at L5 using a 5 inch 22 gauge spinal needle, to
lie along the course of the right L4 medial branch nerve. 0.5 mL of
0.5% bupivacaine were injected.

RIGHT L5 DORSAL RAMUS BLOCK: A posterior oblique approach was taken
to the junction of the S1 superior articular process and sacral ala
on the right using a 5 inch 22 gauge spinal needle, to lie along the
course of the right L5 dorsal ramus. 0.5 mL of 0.5% bupivacaine were
injected.

The procedure was well-tolerated.
IMPRESSION: 1. Technically successful medial branch/dorsal ramus blocks in
preparation for right L5-S1 facet radiofrequency ablation.

I will call the patient later today to assess for durable and
sustained pain relief. An addendum will be placed at that time.

## 2021-01-12 NOTE — Discharge Instructions (Signed)

## 2021-01-14 ENCOUNTER — Other Ambulatory Visit: Payer: Self-pay

## 2021-01-14 ENCOUNTER — Ambulatory Visit: Payer: 59 | Admitting: Family Medicine

## 2021-01-14 ENCOUNTER — Ambulatory Visit
Admission: RE | Admit: 2021-01-14 | Discharge: 2021-01-14 | Disposition: A | Discharge status: Home / Self Care | Payer: 59 | Source: Ambulatory Visit | Attending: Obstetrics and Gynecology | Admitting: Obstetrics and Gynecology

## 2021-01-14 DIAGNOSIS — R9389 Abnormal findings on diagnostic imaging of other specified body structures: Secondary | ICD-10-CM

## 2021-01-14 IMAGING — CT CT ABD-PELV W/ CM
1 of 3 series · 12 of 32 positions shown, 18 images · IV contrast (APPLIED)
Comparison: [DATE] and [DATE]

CLINICAL DATA: Abnormal findings on imaging test.  Enlarged ovary.

EXAM:
CT ABDOMEN AND PELVIS WITH CONTRAST
TECHNIQUE: Multidetector CT imaging of the abdomen and pelvis was performed
using the standard protocol following bolus administration of
intravenous contrast.
CONTRAST:  100mL [5T] IOPAMIDOL ([5T]) INJECTION 61%

[Series 2: abd/pelvis w/cm · axial · 0.97mm/px · z∈[-472,-12]mm · 12 of 108 slices shown, 18 images]
[im 8/108  soft-tissue]
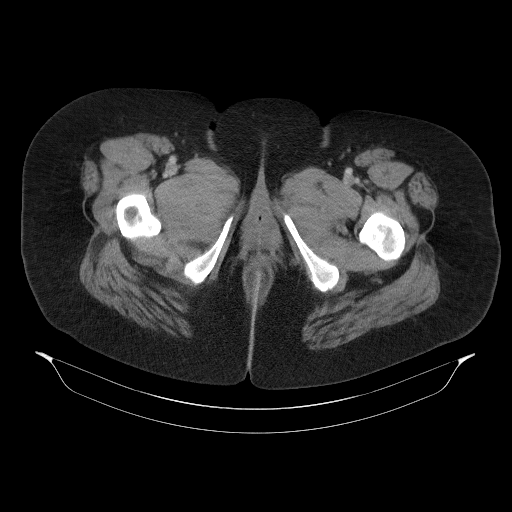
[im 8/108  bone]
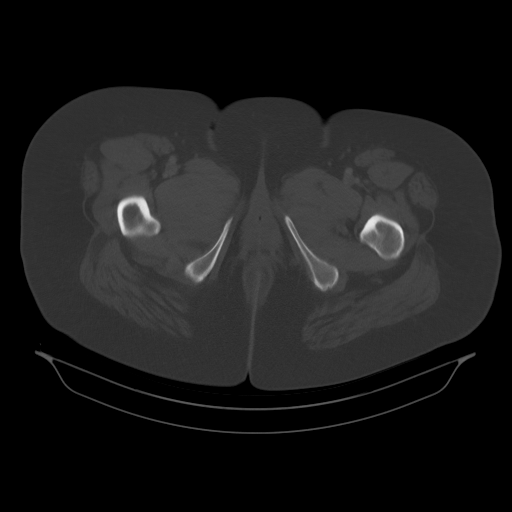
[im 16/108  soft-tissue]
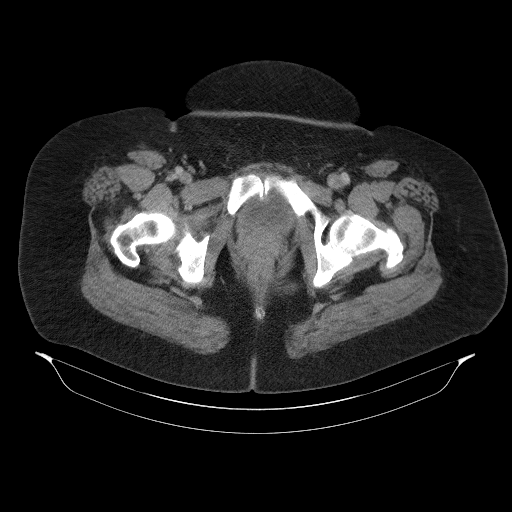
[im 23/108  soft-tissue]
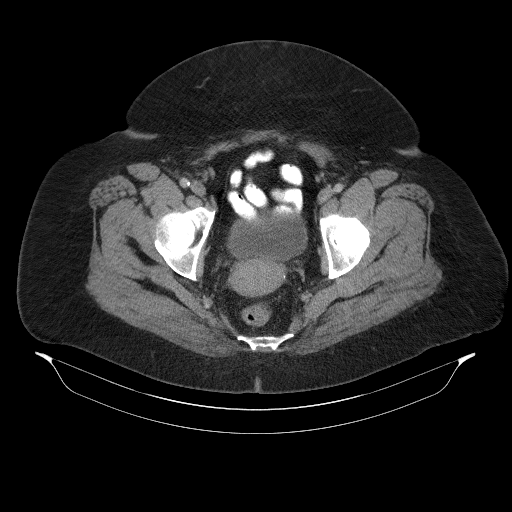
[im 31/108  soft-tissue]
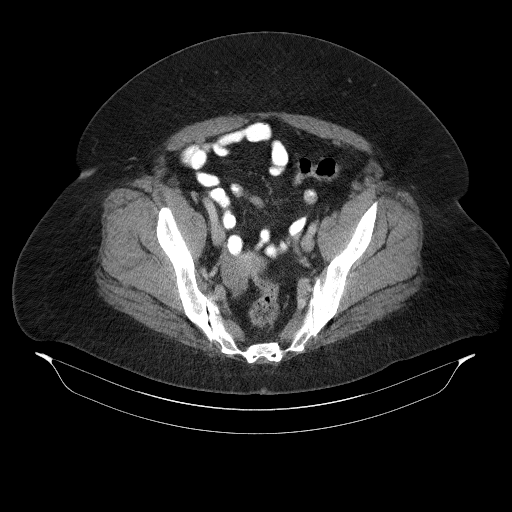
[im 39/108  soft-tissue]
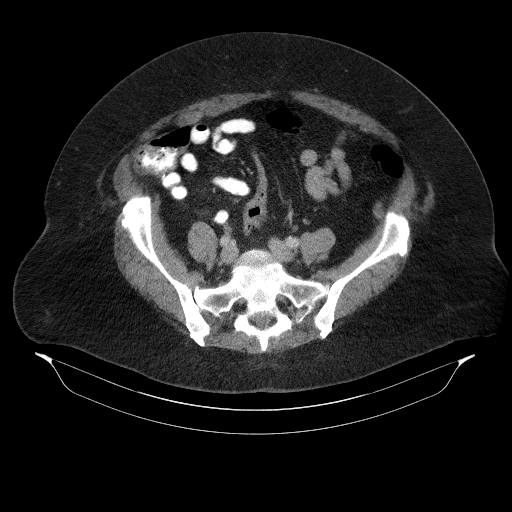
[im 46/108  soft-tissue]
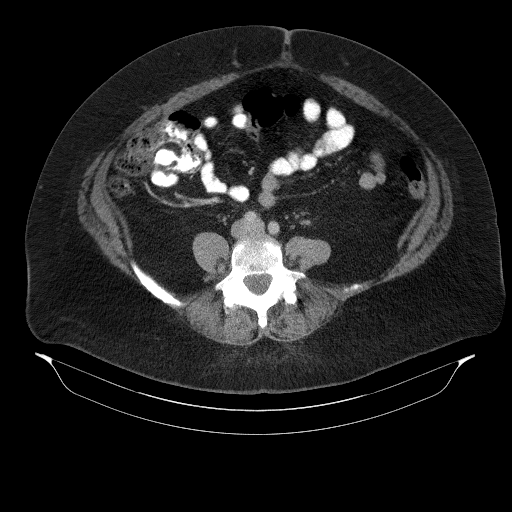
[im 62/108  soft-tissue]
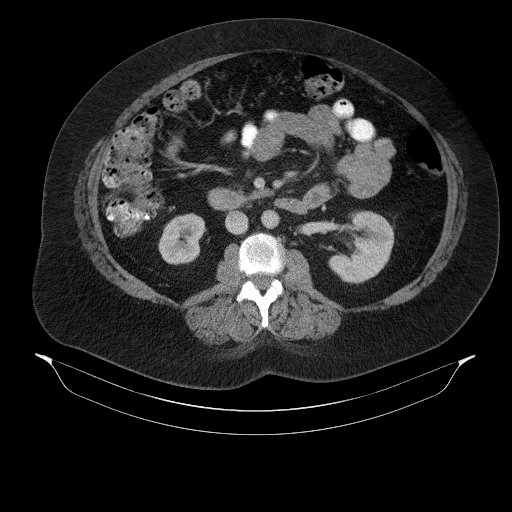
[im 69/108  soft-tissue]
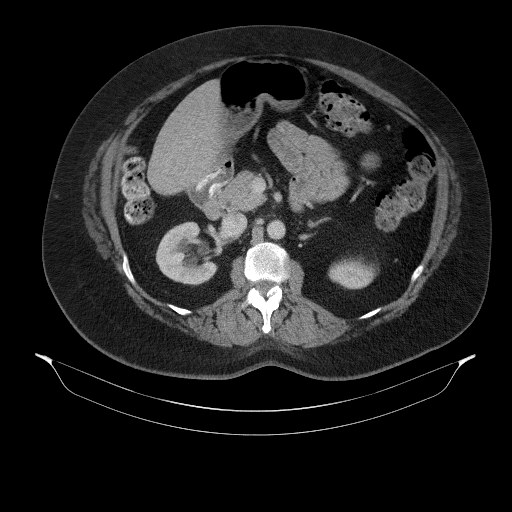
[im 77/108  soft-tissue]
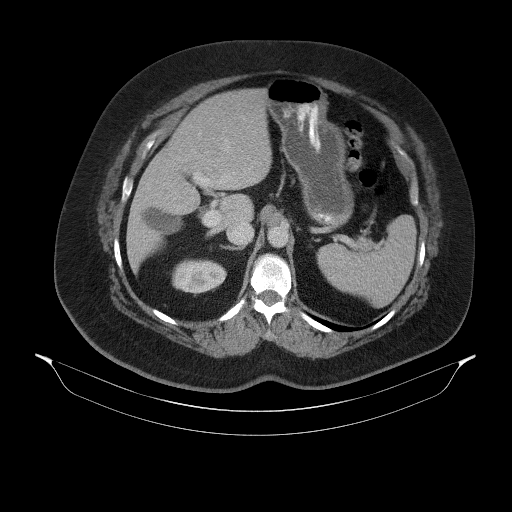
[im 77/108  lung]
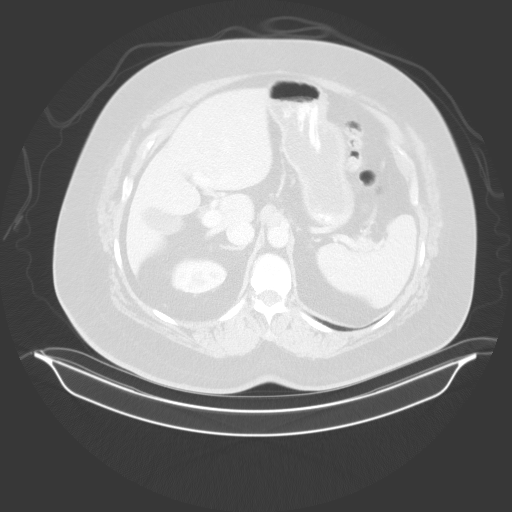
[im 77/108  bone]
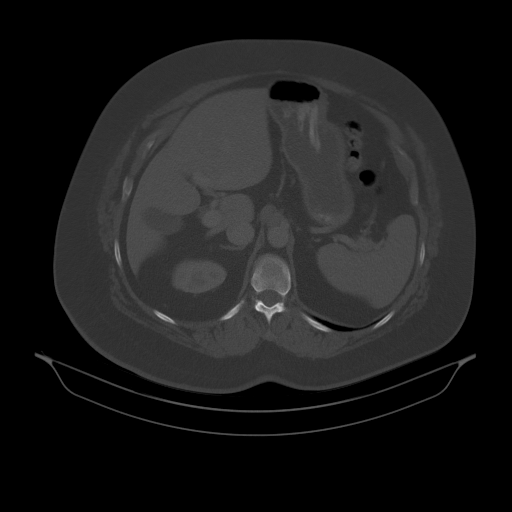
[im 85/108  soft-tissue]
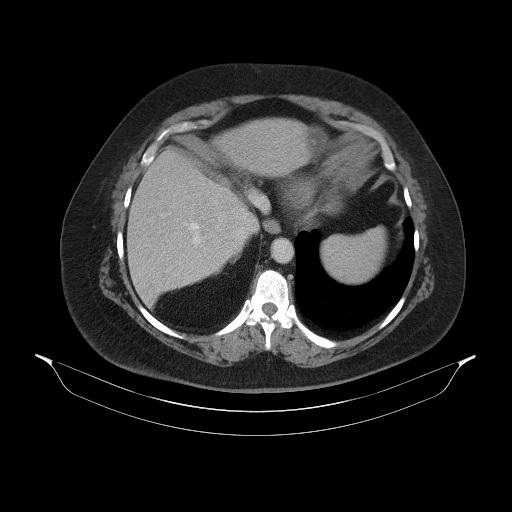
[im 85/108  lung]
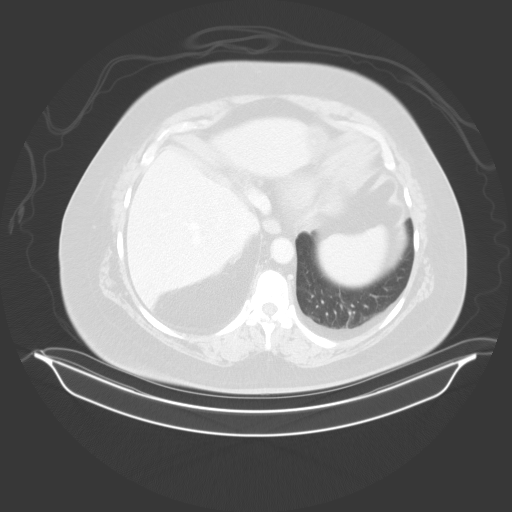
[im 92/108  soft-tissue]
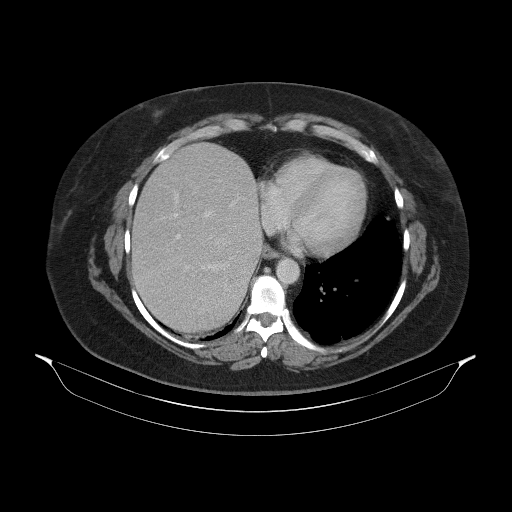
[im 92/108  lung]
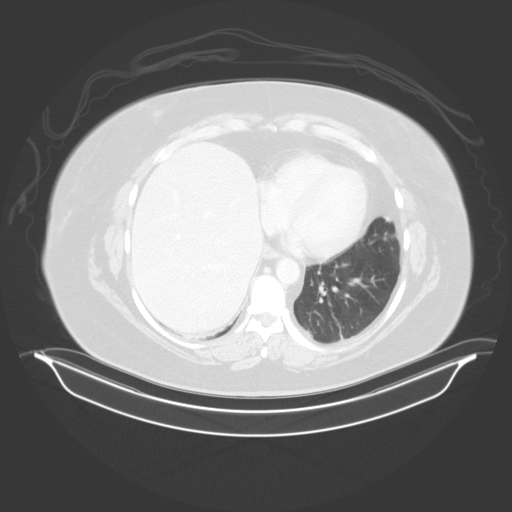
[im 100/108  soft-tissue]
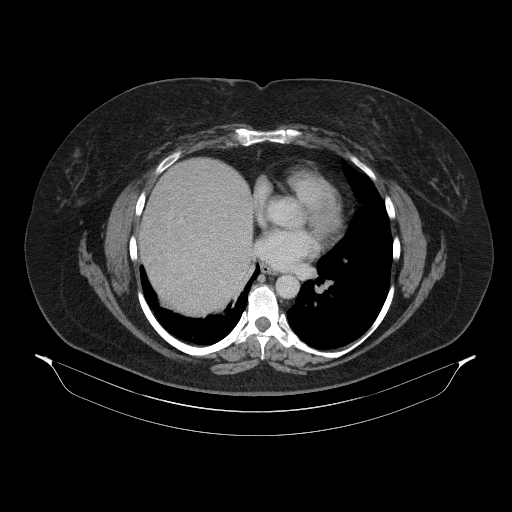
[im 100/108  lung]
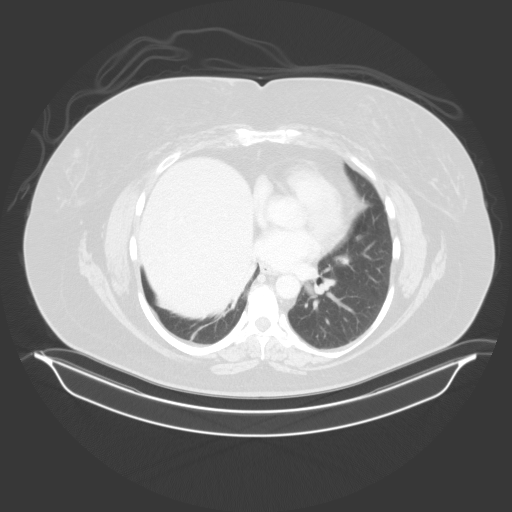

[12 of 32 positions shown; findings below may reference images not displayed]

FINDINGS: Lower chest: Again noted is elevation of the right hemidiaphragm.
Stable posterior left pleural thickening.

Hepatobiliary: No acute abnormality to the liver or gallbladder.
Main portal venous system is patent.

Pancreas: Unremarkable. No pancreatic ductal dilatation or
surrounding inflammatory changes.

Spleen: Normal in size without focal abnormality.

Adrenals/Urinary Tract: Normal adrenal glands. Normal appearance of
both kidneys without hydronephrosis. No suspicious renal lesions.
Normal appearance of the urinary bladder. Minimal fullness of the
left renal pelvis. Small low-density structures in the right kidney
are too small to definitively characterize but favor small cysts.

Stomach/Bowel: Appendectomy. Normal appearance of stomach. No
evidence for bowel obstruction. No focal bowel inflammation.

Vascular/Lymphatic: Mild atherosclerotic disease in the distal
abdominal aorta without aneurysm. Incidentally, there is a
retroaortic left renal vein which is a normal variant. No abdominal
or pelvic lymph node enlargement.

Reproductive: Stable appearance of the uterus and adnexa. Again
noted is noted is low-density material along the posterior aspect of
the uterus which is most likely associated with the right
ovary/adnexa. This area measures 4.3 x 2.6 cm and previously
measured 5.0 x 2.2 cm on [DATE]. Configuration of the right
ovary/right adnexa has not significantly changed since [DATE]
except this area was more hyperdense in [5T].

Other: Negative for ascites. Tiny umbilical hernia containing fat.
Negative for free air.

Musculoskeletal: No acute bone abnormality.
IMPRESSION: 1. No acute abnormality in the abdomen or pelvis.
2. Right adnexa/right ovary has minimally changed since [DATE].
This area has a similar configuration from exam on [DATE] and
suspect there is a small fluid collection in this area. This area
could be better characterized with a pelvic ultrasound.

## 2021-01-14 MED ORDER — IOPAMIDOL (ISOVUE-300) INJECTION 61%
100.0000 mL | Freq: Once | INTRAVENOUS | Status: AC | PRN
  Administered 2021-01-14: 100 mL via INTRAVENOUS

## 2021-01-17 ENCOUNTER — Ambulatory Visit (HOSPITAL_COMMUNITY): Payer: 59

## 2021-01-18 ENCOUNTER — Inpatient Hospital Stay: Admission: RE | Admit: 2021-01-18 | Payer: 59 | Source: Ambulatory Visit

## 2021-01-18 ENCOUNTER — Telehealth: Payer: 59 | Admitting: Family Medicine

## 2021-01-19 ENCOUNTER — Encounter (HOSPITAL_BASED_OUTPATIENT_CLINIC_OR_DEPARTMENT_OTHER): Payer: Self-pay | Admitting: Emergency Medicine

## 2021-01-19 ENCOUNTER — Emergency Department (HOSPITAL_BASED_OUTPATIENT_CLINIC_OR_DEPARTMENT_OTHER)
Admission: EM | Admit: 2021-01-19 | Discharge: 2021-01-19 | Disposition: A | Discharge status: Home / Self Care | Payer: 59 | Attending: Emergency Medicine | Admitting: Emergency Medicine

## 2021-01-19 ENCOUNTER — Other Ambulatory Visit: Payer: Self-pay

## 2021-01-19 ENCOUNTER — Emergency Department (HOSPITAL_BASED_OUTPATIENT_CLINIC_OR_DEPARTMENT_OTHER): Payer: 59

## 2021-01-19 DIAGNOSIS — I10 Essential (primary) hypertension: Secondary | ICD-10-CM | POA: Diagnosis not present

## 2021-01-19 DIAGNOSIS — J45909 Unspecified asthma, uncomplicated: Secondary | ICD-10-CM | POA: Diagnosis not present

## 2021-01-19 DIAGNOSIS — J209 Acute bronchitis, unspecified: Secondary | ICD-10-CM | POA: Diagnosis not present

## 2021-01-19 DIAGNOSIS — R000 Tachycardia, unspecified: Secondary | ICD-10-CM | POA: Insufficient documentation

## 2021-01-19 DIAGNOSIS — Z7951 Long term (current) use of inhaled steroids: Secondary | ICD-10-CM | POA: Diagnosis not present

## 2021-01-19 DIAGNOSIS — Z20822 Contact with and (suspected) exposure to covid-19: Secondary | ICD-10-CM | POA: Insufficient documentation

## 2021-01-19 DIAGNOSIS — J4521 Mild intermittent asthma with (acute) exacerbation: Secondary | ICD-10-CM

## 2021-01-19 DIAGNOSIS — R0602 Shortness of breath: Secondary | ICD-10-CM | POA: Diagnosis present

## 2021-01-19 LAB — CBC WITH DIFFERENTIAL/PLATELET
Abs Immature Granulocytes: 0.01 K/uL (ref 0.00–0.07)
Basophils Absolute: 0.1 K/uL (ref 0.0–0.1)
Basophils Relative: 1 %
Eosinophils Absolute: 0.2 K/uL (ref 0.0–0.5)
Eosinophils Relative: 3 %
HCT: 38 % (ref 36.0–46.0)
Hemoglobin: 12.7 g/dL (ref 12.0–15.0)
Immature Granulocytes: 0 %
Lymphocytes Relative: 19 %
Lymphs Abs: 1.5 K/uL (ref 0.7–4.0)
MCH: 30.5 pg (ref 26.0–34.0)
MCHC: 33.4 g/dL (ref 30.0–36.0)
MCV: 91.3 fL (ref 80.0–100.0)
Monocytes Absolute: 0.5 K/uL (ref 0.1–1.0)
Monocytes Relative: 7 %
Neutro Abs: 5.5 K/uL (ref 1.7–7.7)
Neutrophils Relative %: 70 %
Platelets: 313 K/uL (ref 150–400)
RBC: 4.16 MIL/uL (ref 3.87–5.11)
RDW: 13 % (ref 11.5–15.5)
WBC: 7.8 K/uL (ref 4.0–10.5)
nRBC: 0 % (ref 0.0–0.2)

## 2021-01-19 LAB — BASIC METABOLIC PANEL
Anion gap: 9 (ref 5–15)
BUN: 11 mg/dL (ref 6–20)
CO2: 28 mmol/L (ref 22–32)
Calcium: 9.7 mg/dL (ref 8.9–10.3)
Chloride: 104 mmol/L (ref 98–111)
Creatinine, Ser: 0.88 mg/dL (ref 0.44–1.00)
GFR, Estimated: >60 mL/min (ref >60)
Glucose, Bld: 117 mg/dL — ABNORMAL HIGH (ref 70–99)
Potassium: 3.9 mmol/L (ref 3.5–5.1)
Sodium: 141 mmol/L (ref 135–145)

## 2021-01-19 LAB — RESP PANEL BY RT-PCR (FLU A&B, COVID) ARPGX2
Influenza A by PCR: NEGATIVE
Influenza B by PCR: NEGATIVE
SARS Coronavirus 2 by RT PCR: NEGATIVE

## 2021-01-19 IMAGING — DX DG CHEST 1V PORT
1 series · 1 of 1 positions shown · non-contrast
Comparison: [DATE]

CLINICAL DATA: Shortness of breath

EXAM:
PORTABLE CHEST 1 VIEW

[chest]
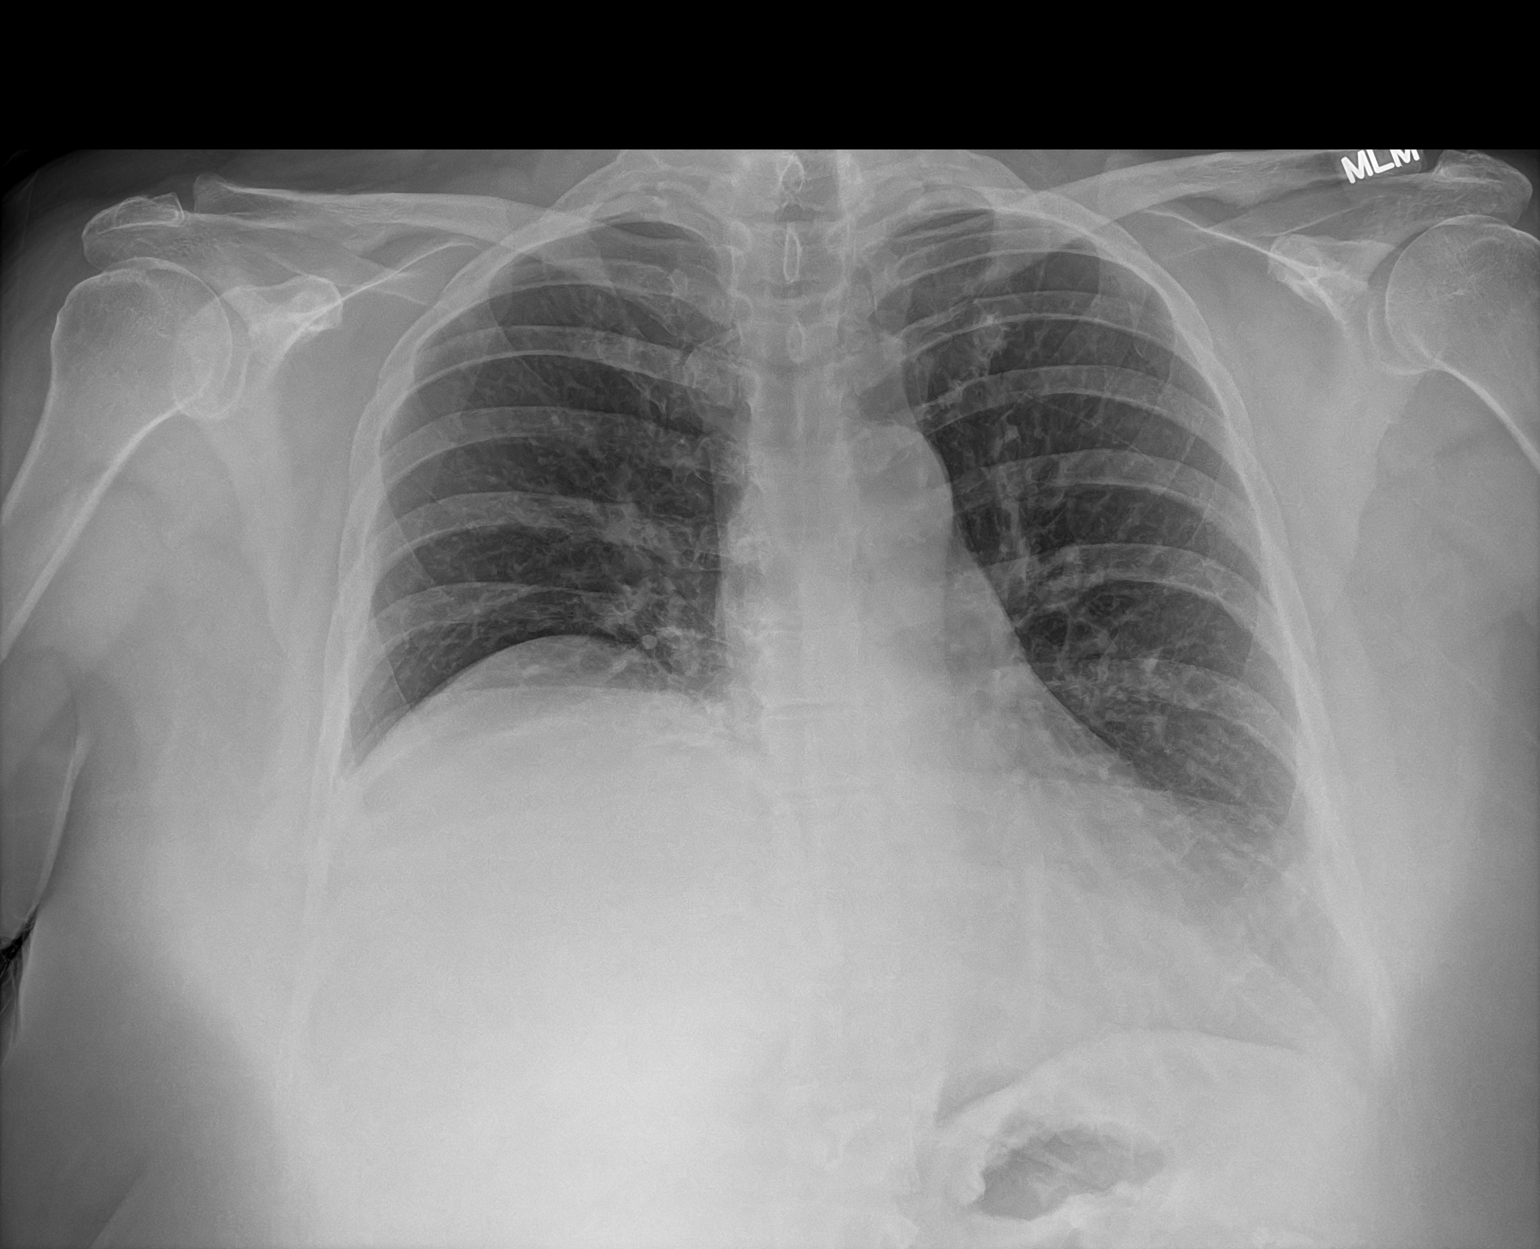

[1 of 1 positions shown; findings below may reference images not displayed]

FINDINGS: Chronic marked elevation of the right diaphragm. Normal heart size
when allowing for mediastinal fat and diaphragmatic elevation. There
is no edema, consolidation, effusion, or pneumothorax.
IMPRESSION: No active disease.

Chronic elevation of the right diaphragm.

## 2021-01-19 MED ORDER — PREDNISONE 10 MG (21) PO TBPK
ORAL_TABLET | Freq: Every day | ORAL | 0 refills | Status: DC
Start: 2021-01-19 — End: 2021-02-02

## 2021-01-19 MED ORDER — HYDROCOD POLST-CPM POLST ER 10-8 MG/5ML PO SUER
5.0000 mL | Freq: Once | ORAL | Status: AC
  Administered 2021-01-19: 5 mL via ORAL
  Filled 2021-01-19: qty 5

## 2021-01-19 MED ORDER — HYDROCOD POLST-CPM POLST ER 10-8 MG/5ML PO SUER: 5.0000 mL | Freq: Two times a day (BID) | ORAL | 0 refills | Status: DC | PRN

## 2021-01-19 MED ORDER — IPRATROPIUM-ALBUTEROL 0.5-2.5 (3) MG/3ML IN SOLN
3.0000 mL | Freq: Once | RESPIRATORY_TRACT | Status: AC
  Administered 2021-01-19: 3 mL via RESPIRATORY_TRACT
  Filled 2021-01-19: qty 3

## 2021-01-19 MED ORDER — ALBUTEROL SULFATE (2.5 MG/3ML) 0.083% IN NEBU
2.5000 mg | INHALATION_SOLUTION | Freq: Once | RESPIRATORY_TRACT | Status: AC
  Administered 2021-01-19: 2.5 mg via RESPIRATORY_TRACT
  Filled 2021-01-19: qty 3

## 2021-01-19 MED ORDER — METHYLPREDNISOLONE SODIUM SUCC 125 MG IJ SOLR
125.0000 mg | Freq: Once | INTRAMUSCULAR | Status: AC
  Administered 2021-01-19: 125 mg via INTRAVENOUS
  Filled 2021-01-19: qty 2

## 2021-01-19 NOTE — ED Triage Notes (Signed)
Reports sore throat since last Thursday.  Seen at [facility].  Started on augmentin.  Negative for flu and covid.  Reports feeling SOB since 4pm yesterday.  Hx of asthma.

## 2021-01-19 NOTE — ED Provider Notes (Signed)
[facility] EMERGENCY DEPT Provider Note   CSN: [id number] Arrival date & time: 01/19/21  1962     History Chief Complaint  Patient presents with   Shortness of Breath    Alyssa Clay is a 55 y.o. female.  Pt presents to the ED today with sob.  The pt has had a sore throat and cough since Thursday, 9/22.  She went to UC on the 26th and was checked for covid/flu and that was negative.  Pt was put on Augmentin.  She has been more sob since yesterday evening.  Pt has a hx of asthma and has been using her inhaler without improvement.      Past Medical History:  Diagnosis Date   Allergy    Anemia    Asthma    Constipation    issues x the past month -? 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   Enlarged ovary 09/15/2020   noted on ct scan, per pt   FUO (fever of unknown origin) 10/21/2019   GERD (gastroesophageal reflux disease)    Hypertension    PAST hx    Migraine     Patient Active Problem List   Diagnosis Date Noted   Lumbar radiculopathy, right 10/08/2020   SI (sacroiliac) joint dysfunction 09/16/2020   Somatic dysfunction of spine, sacral 09/16/2020   OSA (obstructive sleep apnea) 12/18/2019   FUO (fever of unknown origin) 10/21/2019   S/P bilateral breast reduction 06/19/2019   Back pain 04/15/2019   Neck pain 04/15/2019   History of frequent upper respiratory infection 01/16/2019   Atelectasis 01/16/2019   Severe persistent asthma 01/16/2019   Shortness of breath 01/01/2019   Primary osteoarthritis of both knees 07/10/2018   Hypertension 03/27/2018   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/2018   Nonallergic rhinitis 01/07/2018   Gastroesophageal reflux disease 01/07/2018    Past Surgical History:  Procedure Laterality Date   APPENDECTOMY     BREAST REDUCTION SURGERY Bilateral 06/12/2019   Procedure: BREAST REDUCTION WITH LIPOSUCTION;   Surgeon: Wallace Going, DO;  Location: [facility];  Service: Plastics;  Laterality: Bilateral;  4 hours, please   CESAREAN SECTION  1994   DILATION AND CURETTAGE OF UTERUS     multiple due to miscarriages   KNEE ARTHROSCOPY Left 2013   LAPAROSCOPIC APPENDECTOMY N/A 12/08/2018   Procedure: APPENDECTOMY LAPAROSCOPIC;  Surgeon: Donnie Mesa, MD;  Location: [facility];  Service: General;  Laterality: N/A;   TENNIS ELBOW RELEASE/NIRSCHEL PROCEDURE  2009   WISDOM TOOTH EXTRACTION       OB History     Gravida  5   Para  1   Term  1   Preterm      AB  4   Living  2      SAB  4   IAB      Ectopic      Multiple      Live Births  1           Family History  Problem Relation Age of Onset   Depression Mother    Lymphoma Mother 78   Hypertension Father    Heart attack Father 70   Hypertension Brother    High blood pressure Brother    High Cholesterol Brother    Healthy Daughter    Colon cancer Maternal Aunt 53  Esophageal cancer Neg Hx    Pancreatic cancer Neg Hx    Stomach cancer Neg Hx    Colon polyps Neg Hx    Rectal cancer Neg Hx     Social History   Tobacco Use   Smoking status: Never   Smokeless tobacco: Never  Vaping Use   Vaping Use: Never used  Substance Use Topics   Alcohol use: Yes    Comment: occ 2-3 beers a month    Drug use: Never    Home Medications Prior to Admission medications   Medication Sig Start Date End Date Taking? Authorizing Provider  chlorpheniramine-HYDROcodone (TUSSIONEX PENNKINETIC ER) 10-8 MG/5ML SUER Take 5 mLs by mouth every 12 (twelve) hours as needed for cough. 01/19/21  Yes Isla Pence, MD  predniSONE (STERAPRED UNI-PAK 21 TAB) 10 MG (21) TBPK tablet Take by mouth daily. Take 6 tabs by mouth daily  for 2 days, then 5 tabs for 2 days, then 4 tabs for 2 days, then 3 tabs for 2 days, 2 tabs for 2 days, then 1 tab by mouth daily for 2 days 01/19/21  Yes Isla Pence, MD  albuterol [facility] HFA) 108  (90 Base) MCG/ACT inhaler Inhale 2 puffs into the lungs every 4 (four) hours as needed for wheezing or shortness of breath. 07/16/20   Garnet Sierras, DO  albuterol (PROVENTIL) (2.5 MG/3ML) 0.083% nebulizer solution Take 2.5 mg by nebulization every 6 (six) hours as needed for wheezing or shortness of breath.    [provider]  amoxicillin-clavulanate (AUGMENTIN) 875-125 MG tablet Take 1 tablet by mouth every 12 (twelve) hours. For sinusitis 11/09/20   Panosh, Standley Brooking, MD  budesonide-formoterol (SYMBICORT) 160-4.5 MCG/ACT inhaler USE 2 INHALATIONS BY MOUTH  TWICE DAILY 08/23/20   Garnet Sierras, DO  buPROPion (WELLBUTRIN XL) 150 MG 24 hr tablet TAKE 1 TABLET BY MOUTH  DAILY 12/09/20   Koberlein, Steele Berg, MD  calcium carbonate (OSCAL) 1500 (600 Ca) MG TABS tablet Take 600 mg of elemental calcium by mouth 2 (two) times daily with a meal.    [provider]  cyclobenzaprine (FLEXERIL) 10 MG tablet Take 10 mg by mouth 3 (three) times daily as needed (migraines).    [provider]  DULoxetine (CYMBALTA) 60 MG capsule TAKE 1 CAPSULE BY MOUTH  DAILY 10/29/20   Koberlein, Steele Berg, MD  Erenumab-aooe (AIMOVIG) 140 MG/ML SOAJ INJECT 140 MG INTO THE SKIN EVERY 30 (THIRTY) DAYS. 03/01/20   [provider]  Estradiol (YUVAFEM) 10 MCG TABS vaginal tablet Place 1 tablet (10 mcg total) vaginally daily. 08/30/20   Koberlein, Steele Berg, MD  FASENRA 30 MG/ML SOSY SECOND SHIP: INJECT ONE SYRINGE UNDER THE SKIN AT WEEK 4 AND 8, THEN EVERY 8 WEEKS THEREAFTER. Patient taking differently: Inject 30 mg into the skin See admin instructions. Every 8 weeks 09/24/18   Garnet Sierras, DO  fluticasone [facility]) 50 MCG/ACT nasal spray Place 1 spray into both nostrils daily. 07/02/20   Billie Ruddy, MD  fluticasone (FLOVENT HFA) 110 MCG/ACT inhaler Inhale 2 puffs into the lungs 2 (two) times daily. During respiratory infections/asthma flares. 11/12/19   Garnet Sierras, DO  gabapentin (NEURONTIN) 300 MG capsule Take  300-900 mg by mouth See admin instructions. Take 1 tablet every AM, three tablets at bedtime 01/03/18   [provider]  hydrOXYzine (ATARAX/VISTARIL) 25 MG tablet Take 25 mg by mouth 3 (three) times daily as needed.    [provider]  meloxicam (MOBIC) 15  MG tablet TAKE 1 TABLET (15 MG TOTAL) BY MOUTH DAILY. 09/27/20   Laurey Morale, MD  montelukast (SINGULAIR) 10 MG tablet TAKE 1 TABLET BY MOUTH  DAILY 12/09/20   Garnet Sierras, DO  Multiple Vitamin (MULTIVITAMIN WITH MINERALS) TABS tablet Take 1 tablet by mouth daily.    [provider]  nystatin cream (MYCOSTATIN) Apply 1 application topically 2 (two) times daily. 06/14/20   Billie Ruddy, MD  pantoprazole (PROTONIX) 40 MG tablet TAKE 1 TABLET BY MOUTH 2 TIMES DAILY BEFORE A MEAL. 07/02/20   Caren Macadam, MD  Respiratory Therapy Supplies (FLUTTER) DEVI Use 3 times daily as directed 01/22/19   Mannam, Praveen, MD  Rimegepant Sulfate (NURTEC) 75 MG TBDP Take by mouth.    [provider]  rizatriptan (MAXALT) 10 MG tablet TAKE 1 TABLET BY MOUTH ONCE AS NEEDED FOR MIGRAINE FOR UP TO 1 DOSE. MAY REPEAT IN 2 HOURS IF NEEDED 08/13/20   [provider]  rosuvastatin (CRESTOR) 5 MG tablet Take 1 tablet (5 mg total) by mouth at bedtime. 10/29/20   Koberlein, Steele Berg, MD  tiZANidine (ZANAFLEX) 4 MG tablet Take 4 mg by mouth every 6 (six) hours as needed for muscle spasms.    [provider]  traZODone (DESYREL) 100 MG tablet Take 1-2 tablets (100-200 mg total) by mouth at bedtime as needed for sleep. 07/02/20   Caren Macadam, MD  valACYclovir (VALTREX) 1000 MG tablet Take 1 tablet (1,000 mg total) by mouth 2 (two) times daily. 07/15/20   Caren Macadam, MD    Allergies    Patient has no known allergies.  Review of Systems   Review of Systems  Respiratory:  Positive for cough, shortness of breath and wheezing.   All other systems reviewed and are negative.  Physical Exam Updated Vital  Signs BP (!) 170/70   Pulse (!) 105   Temp 98.7 F (37.1 C) (Oral)   Resp 18   Ht 5\' 5"  (1.651 m)   Wt 114.3 kg   LMP 12/20/2018   SpO2 99%   BMI 41.93 kg/m   Physical Exam Vitals and nursing note reviewed.  Constitutional:      Appearance: She is well-developed. She is obese.  HENT:     Head: Normocephalic and atraumatic.     Mouth/Throat:     Mouth: Mucous membranes are moist.     Pharynx: Oropharynx is clear.  Eyes:     Extraocular Movements: Extraocular movements intact.     Pupils: Pupils are equal, round, and reactive to light.  Cardiovascular:     Rate and Rhythm: Regular rhythm. Tachycardia present.  Pulmonary:     Effort: Tachypnea present.     Breath sounds: Wheezing present.  Abdominal:     General: Bowel sounds are normal.     Palpations: Abdomen is soft.  Musculoskeletal:        General: Normal range of motion.     Cervical back: Normal range of motion and neck supple.  Skin:    General: Skin is warm.     Capillary Refill: Capillary refill takes less than 2 seconds.  Neurological:     General: No focal deficit present.     Mental Status: She is alert and oriented to person, place, and time.  Psychiatric:        Mood and Affect: Mood normal.        Behavior: Behavior normal.    ED Results / Procedures / Treatments  Labs (all labs ordered are listed, but only abnormal results are displayed) Labs Reviewed  BASIC METABOLIC PANEL - Abnormal; Notable for the following components:      Result Value   Glucose, Bld 117 (*)    All other components within normal limits  RESP PANEL BY RT-PCR (FLU A&B, COVID) ARPGX2  CBC WITH DIFFERENTIAL/PLATELET    EKG EKG Interpretation  Date/Time:  Wednesday January 19 2021 07:38:57 EDT Ventricular Rate:  99 PR Interval:  161 QRS Duration: 99 QT Interval:  346 QTC Calculation: 444 R Axis:   -12 Text Interpretation: Sinus rhythm Baseline wander in lead(s) II III aVF No significant change since last tracing  Confirmed by Isla Pence [phone]) on 01/19/2021 7:57:58 AM  Radiology DG Chest Portable 1 View  Result Date: 01/19/2021 CLINICAL DATA:  Shortness of breath EXAM: PORTABLE CHEST 1 VIEW COMPARISON:  11/04/2020 FINDINGS: Chronic marked elevation of the right diaphragm. Normal heart size when allowing for mediastinal fat and diaphragmatic elevation. There is no edema, consolidation, effusion, or pneumothorax. IMPRESSION: No active disease. Chronic elevation of the right diaphragm. Electronically Signed   By: Jorje Guild M.D.   On: 01/19/2021 07:52    Procedures Procedures   Medications Ordered in ED Medications  ipratropium-albuterol (DUONEB) 0.5-2.5 (3) MG/3ML nebulizer solution 3 mL (3 mLs Nebulization Given 01/19/21 0756)  albuterol (PROVENTIL) (2.5 MG/3ML) 0.083% nebulizer solution 2.5 mg (2.5 mg Nebulization Given 01/19/21 0756)  methylPREDNISolone sodium succinate (SOLU-MEDROL) 125 mg/2 mL injection 125 mg (125 mg Intravenous Given 01/19/21 0752)  chlorpheniramine-HYDROcodone (TUSSIONEX) 10-8 MG/5ML suspension 5 mL (5 mLs Oral Given 01/19/21 0805)    ED Course  I have reviewed the triage vital signs and the nursing notes.  Pertinent labs & imaging results that were available during my care of the patient were reviewed by me and considered in my medical decision making (see chart for details).    MDM Rules/Calculators/A&P                           Pt is feeling much better.  She is now able to take a deep breath.  Covid is neg.  CXR is clear.  She is already on Augmentin.  She is to use her inhaler with her spacer.  She is d/c on prednisone and tussionex.  She is stable for d/c.  Return if worse.  F/u with pcp. Final Clinical Impression(s) / ED Diagnoses Final diagnoses:  Acute bronchitis, unspecified organism  Mild intermittent asthma with exacerbation    Rx / DC Orders ED Discharge Orders          Ordered    predniSONE (STERAPRED UNI-PAK 21 TAB) 10 MG (21) TBPK tablet   Daily        01/19/21 0852    chlorpheniramine-HYDROcodone (TUSSIONEX PENNKINETIC ER) 10-8 MG/5ML SUER  Every 12 hours PRN        01/19/21 7253             Isla Pence, MD 01/19/21 [phone]

## 2021-01-20 ENCOUNTER — Ambulatory Visit: Payer: 59 | Admitting: Adult Health

## 2021-01-26 ENCOUNTER — Ambulatory Visit: Payer: 59 | Admitting: Family Medicine

## 2021-02-02 ENCOUNTER — Encounter: Payer: Self-pay | Admitting: Family Medicine

## 2021-02-02 ENCOUNTER — Ambulatory Visit (INDEPENDENT_AMBULATORY_CARE_PROVIDER_SITE_OTHER): Payer: 59 | Admitting: Family Medicine

## 2021-02-02 ENCOUNTER — Other Ambulatory Visit: Payer: Self-pay

## 2021-02-02 VITALS — BP 124/64 | HR 71 | Temp 98.1°F | Ht 65.0 in | Wt 253.3 lb

## 2021-02-02 DIAGNOSIS — K219 Gastro-esophageal reflux disease without esophagitis: Secondary | ICD-10-CM | POA: Diagnosis not present

## 2021-02-02 DIAGNOSIS — G4733 Obstructive sleep apnea (adult) (pediatric): Secondary | ICD-10-CM | POA: Diagnosis not present

## 2021-02-02 DIAGNOSIS — R7301 Impaired fasting glucose: Secondary | ICD-10-CM

## 2021-02-02 DIAGNOSIS — Z23 Encounter for immunization: Secondary | ICD-10-CM | POA: Diagnosis not present

## 2021-02-02 DIAGNOSIS — J4550 Severe persistent asthma, uncomplicated: Secondary | ICD-10-CM

## 2021-02-02 DIAGNOSIS — I10 Essential (primary) hypertension: Secondary | ICD-10-CM

## 2021-02-02 DIAGNOSIS — E785 Hyperlipidemia, unspecified: Secondary | ICD-10-CM | POA: Diagnosis not present

## 2021-02-02 LAB — LIPID PANEL
Cholesterol: 189 mg/dL (ref 0–200)
HDL: 59.1 mg/dL (ref >39.00)
LDL Cholesterol: 100 mg/dL — ABNORMAL HIGH (ref 0–99)
NonHDL: 130.24
Total CHOL/HDL Ratio: 3
Triglycerides: 149 mg/dL (ref 0.0–149.0)
VLDL: 29.8 mg/dL (ref 0.0–40.0)

## 2021-02-02 LAB — HEMOGLOBIN A1C: Hgb A1c MFr Bld: 5.9 % (ref 4.6–6.5)

## 2021-02-02 NOTE — Progress Notes (Signed)
Alyssa Clay DOB: 1965-11-03 Encounter date: 02/02/2021  This is a 55 y.o. female who presents with Chief Complaint  Patient presents with   Follow-up    History of present illness: Had bronchitis and in ER 9/28 - but is better. Combo of abx, steroid, tussionex helped.   Getting ablation for back in 2 weeks. Has not gone to gym in 2 months. Trying to eat well, but once or twice a week she has intense sweet craving. Past week has been really bad for back. Little things just throw her into pain.  Headaches have been horrible. Wondering about a new medication. Initially did well with aimovig but just isn't working as well. Half of mont are headache days. Started taking claritin at night due to starting with burning sensation frontal sinus. Just doesn't hold her with sx.   Sleep apnea: she hasn't done the sleep evaluation yet; went there and laid down and then got headache. hasn't rescheduled this. Has delayed due to back pain.   Asthma/restrictive lung disease: Follows with pulmonology regularly. States that breathing overall is better. continues with flovent with the asthma flares, but symbicort regularly, singulair, but not flonase.   Acid reflux: Protonix 40 mg daily. she does take in evening.   Following regularly with dermatology, gynecology.  Last Pap smear 04/2017.  Colonoscopy completed since last visit: repeat colonoscopy due 09/2030  She is following with Dr. Murrell Redden for monitoring of spot in breast and enlarged ovary. She has follow up mammogram set up in December.    No Known Allergies Current Meds  Medication Sig   albuterol (PROAIR HFA) 108 (90 Base) MCG/ACT inhaler Inhale 2 puffs into the lungs every 4 (four) hours as needed for wheezing or shortness of breath.   albuterol (PROVENTIL) (2.5 MG/3ML) 0.083% nebulizer solution Take 2.5 mg by nebulization every 6 (six) hours as needed for wheezing or shortness of breath.   budesonide-formoterol (SYMBICORT) 160-4.5  MCG/ACT inhaler USE 2 INHALATIONS BY MOUTH  TWICE DAILY   buPROPion (WELLBUTRIN XL) 150 MG 24 hr tablet TAKE 1 TABLET BY MOUTH  DAILY   calcium carbonate (OSCAL) 1500 (600 Ca) MG TABS tablet Take 600 mg of elemental calcium by mouth 2 (two) times daily with a meal.   cyclobenzaprine (FLEXERIL) 10 MG tablet Take 10 mg by mouth 3 (three) times daily as needed (migraines).   DULoxetine (CYMBALTA) 60 MG capsule TAKE 1 CAPSULE BY MOUTH  DAILY   Erenumab-aooe (AIMOVIG) 140 MG/ML SOAJ INJECT 140 MG INTO THE SKIN EVERY 30 (THIRTY) DAYS.   Estradiol (YUVAFEM) 10 MCG TABS vaginal tablet Place 1 tablet (10 mcg total) vaginally daily.   FASENRA 30 MG/ML SOSY SECOND SHIP: INJECT ONE SYRINGE UNDER THE SKIN AT WEEK 4 AND 8, THEN EVERY 8 WEEKS THEREAFTER. (Patient taking differently: Inject 30 mg into the skin See admin instructions. Every 8 weeks)   fluticasone (FLONASE) 50 MCG/ACT nasal spray Place 1 spray into both nostrils daily.   fluticasone (FLOVENT HFA) 110 MCG/ACT inhaler Inhale 2 puffs into the lungs 2 (two) times daily. During respiratory infections/asthma flares.   gabapentin (NEURONTIN) 300 MG capsule Take 300-900 mg by mouth See admin instructions. Take 1 tablet every AM, three tablets at bedtime   hydrOXYzine (ATARAX/VISTARIL) 25 MG tablet Take 25 mg by mouth 3 (three) times daily as needed.   meloxicam (MOBIC) 15 MG tablet TAKE 1 TABLET (15 MG TOTAL) BY MOUTH DAILY.   montelukast (SINGULAIR) 10 MG tablet TAKE 1 TABLET BY MOUTH  DAILY  Multiple Vitamin (MULTIVITAMIN WITH MINERALS) TABS tablet Take 1 tablet by mouth daily.   nystatin cream (MYCOSTATIN) Apply 1 application topically 2 (two) times daily.   pantoprazole (PROTONIX) 40 MG tablet TAKE 1 TABLET BY MOUTH 2 TIMES DAILY BEFORE A MEAL.   Respiratory Therapy Supplies (FLUTTER) DEVI Use 3 times daily as directed   Rimegepant Sulfate (NURTEC) 75 MG TBDP Take by mouth.   rizatriptan (MAXALT) 10 MG tablet TAKE 1 TABLET BY MOUTH ONCE AS NEEDED FOR  MIGRAINE FOR UP TO 1 DOSE. MAY REPEAT IN 2 HOURS IF NEEDED   rosuvastatin (CRESTOR) 5 MG tablet Take 1 tablet (5 mg total) by mouth at bedtime.   tiZANidine (ZANAFLEX) 4 MG tablet Take 4 mg by mouth every 6 (six) hours as needed for muscle spasms.   traZODone (DESYREL) 100 MG tablet Take 1-2 tablets (100-200 mg total) by mouth at bedtime as needed for sleep.   valACYclovir (VALTREX) 1000 MG tablet Take 1 tablet (1,000 mg total) by mouth 2 (two) times daily.   Current Facility-Administered Medications for the 02/02/21 encounter (Office Visit) with Caren Macadam, MD  Medication   0.9 %  sodium chloride infusion    Review of Systems  Constitutional:  Negative for chills, fatigue and fever.  Respiratory:  Negative for cough, chest tightness, shortness of breath and wheezing.   Cardiovascular:  Negative for chest pain, palpitations and leg swelling.  Musculoskeletal:  Positive for back pain.   Objective:  BP 124/64 (BP Location: Left Arm, Patient Position: Sitting, Cuff Size: Large)   Pulse 71   Temp 98.1 F (36.7 C) (Oral)   Ht 5\' 5"  (1.651 m)   Wt 253 lb 4.8 oz (114.9 kg)   LMP 12/20/2018   SpO2 97%   BMI 42.15 kg/m   Weight: 253 lb 4.8 oz (114.9 kg)   BP Readings from Last 3 Encounters:  02/02/21 124/64  01/19/21 111/60  01/12/21 (!) 160/88   Wt Readings from Last 3 Encounters:  02/02/21 253 lb 4.8 oz (114.9 kg)  01/19/21 251 lb 15.8 oz (114.3 kg)  11/29/20 252 lb (114.3 kg)    Physical Exam Constitutional:      General: She is not in acute distress.    Appearance: She is well-developed.  Cardiovascular:     Rate and Rhythm: Normal rate and regular rhythm.     Heart sounds: Normal heart sounds. No murmur heard.   No friction rub.  Pulmonary:     Effort: Pulmonary effort is normal. No respiratory distress.     Breath sounds: Normal breath sounds. No wheezing or rales.  Musculoskeletal:     Right lower leg: No edema.     Left lower leg: No edema.   Neurological:     Mental Status: She is alert and oriented to person, place, and time.  Psychiatric:        Behavior: Behavior normal.    Assessment/Plan  1. Primary hypertension Bp is well controlled without medication.   2. Severe persistent asthma, unspecified whether complicated Breathing is doing well now s/p recovery from recent infection. Continue with symbicort, following with pulmonology.   3. OSA (obstructive sleep apnea) She is planning to call to set up sleep study once she gets her back feeling better.   4. Gastroesophageal reflux disease, unspecified whether esophagitis present Continue with protonix.    5. Impaired fasting glucose Has been diet controlled. She is interested in GLP-1 category after some discussion. We will see how sugars are looking  today to help determine possible med options that would help her achieve weight loss.  - Hemoglobin A1c; Future - Hemoglobin A1c  6. Hyperlipidemia, unspecified hyperlipidemia type Recently started Crestor 5 mg daily.  She is tolerating this well.  We will recheck lipids today. - Lipid panel; Future - Lipid panel  7. Need for prophylactic vaccination against Streptococcus pneumoniae (pneumococcus)  8. Need for immunization against influenza - Flu Vaccine QUAD 6+ mos PF IM (Fluarix Quad PF)   Return in about 6 months (around 08/03/2021) for Chronic condition visit.     Micheline Rough, MD

## 2021-02-04 ENCOUNTER — Encounter: Payer: Self-pay | Admitting: Family Medicine

## 2021-02-06 ENCOUNTER — Ambulatory Visit (HOSPITAL_COMMUNITY)
Admission: RE | Admit: 2021-02-06 | Discharge: 2021-02-06 | Disposition: A | Discharge status: Home / Self Care | Payer: 59 | Source: Ambulatory Visit | Attending: Emergency Medicine | Admitting: Emergency Medicine

## 2021-02-06 ENCOUNTER — Encounter (HOSPITAL_COMMUNITY): Payer: Self-pay

## 2021-02-06 ENCOUNTER — Other Ambulatory Visit: Payer: Self-pay

## 2021-02-06 VITALS — BP 133/82 | HR 98 | Temp 98.7°F | Resp 16

## 2021-02-06 DIAGNOSIS — J029 Acute pharyngitis, unspecified: Secondary | ICD-10-CM | POA: Diagnosis present

## 2021-02-06 LAB — POCT RAPID STREP A, ED / UC: Streptococcus, Group A Screen (Direct): NEGATIVE

## 2021-02-06 NOTE — ED Provider Notes (Signed)
[facility]    CSN: [id number] Arrival date & time: 02/06/21  1335      History   Chief Complaint Chief Complaint  Patient presents with   Appointment    Sore throat    HPI Alyssa Clay is a 55 y.o. female.   Patient here for evaluation of sore throat, fever, fatigue, and body aches that have been ongoing since Wednesday.  T-max 100.8 at home, temperature 98.7 in office.  Reports having bronchitis several weeks ago and was treated with Augmentin and steroids.  Reports symptoms improved.  Patient got flu and pneumonia shot on Wednesday.  Reports sore throat worse with swallowing.  Reports symptoms feel similar to strep in the past.  Denies any trauma, injury, or other precipitating event.  Denies any chest pain, shortness of breath, N/V/D, numbness, tingling, weakness, abdominal pain, or headaches.    The history is provided by the patient.   Past Medical History:  Diagnosis Date   Allergy    Anemia    Asthma    Constipation    issues x the past month -? 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   Enlarged ovary 09/15/2020   noted on ct scan, per pt   FUO (fever of unknown origin) 10/21/2019   GERD (gastroesophageal reflux disease)    Hypertension    PAST hx    Migraine     Patient Active Problem List   Diagnosis Date Noted   Lumbar radiculopathy, right 10/08/2020   SI (sacroiliac) joint dysfunction 09/16/2020   Somatic dysfunction of spine, sacral 09/16/2020   OSA (obstructive sleep apnea) 12/18/2019   FUO (fever of unknown origin) 10/21/2019   S/P bilateral breast reduction 06/19/2019   Back pain 04/15/2019   Neck pain 04/15/2019   History of frequent upper respiratory infection 01/16/2019   Atelectasis 01/16/2019   Severe persistent asthma 01/16/2019   Shortness of breath 01/01/2019   Primary osteoarthritis of both knees 07/10/2018   Hypertension 03/27/2018   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/2018   Nonallergic rhinitis 01/07/2018   Gastroesophageal reflux disease 01/07/2018    Past Surgical History:  Procedure Laterality Date   APPENDECTOMY     BREAST REDUCTION SURGERY Bilateral 06/12/2019   Procedure: BREAST REDUCTION WITH LIPOSUCTION;  Surgeon: Wallace Going, DO;  Location: [place];  Service: Plastics;  Laterality: Bilateral;  4 hours, please   CESAREAN SECTION  1994   DILATION AND CURETTAGE OF UTERUS     multiple due to miscarriages   KNEE ARTHROSCOPY Left 2013   LAPAROSCOPIC APPENDECTOMY N/A 12/08/2018   Procedure: APPENDECTOMY LAPAROSCOPIC;  Surgeon: Donnie Mesa, MD;  Location: [place];  Service: General;  Laterality: N/A;   TENNIS ELBOW RELEASE/NIRSCHEL PROCEDURE  2009   WISDOM TOOTH EXTRACTION      OB History     Gravida  5   Para  1   Term  1   Preterm      AB  4   Living  2      SAB  4   IAB      Ectopic      Multiple      Live Births  1            Home Medications    Prior to Admission medications   Medication Sig Start Date End Date Taking?  Authorizing Provider  albuterol (PROAIR HFA) 108 (90 Base) MCG/ACT inhaler Inhale 2 puffs into the lungs every 4 (four) hours as needed for wheezing or shortness of breath. 07/16/20   Garnet Sierras, DO  albuterol (PROVENTIL) (2.5 MG/3ML) 0.083% nebulizer solution Take 2.5 mg by nebulization every 6 (six) hours as needed for wheezing or shortness of breath.    [provider]  budesonide-formoterol (SYMBICORT) 160-4.5 MCG/ACT inhaler USE 2 INHALATIONS BY MOUTH  TWICE DAILY 08/23/20   Garnet Sierras, DO  buPROPion (WELLBUTRIN XL) 150 MG 24 hr tablet TAKE 1 TABLET BY MOUTH  DAILY 12/09/20   Koberlein, Steele Berg, MD  calcium carbonate (OSCAL) 1500 (600 Ca) MG TABS tablet Take 600 mg of elemental calcium by mouth 2 (two) times daily with a meal.    [provider]  cyclobenzaprine (FLEXERIL) 10 MG tablet Take 10 mg by  mouth 3 (three) times daily as needed (migraines).    [provider]  DULoxetine (CYMBALTA) 60 MG capsule TAKE 1 CAPSULE BY MOUTH  DAILY 10/29/20   Koberlein, Steele Berg, MD  Erenumab-aooe (AIMOVIG) 140 MG/ML SOAJ INJECT 140 MG INTO THE SKIN EVERY 30 (THIRTY) DAYS. 03/01/20   [provider]  Estradiol (YUVAFEM) 10 MCG TABS vaginal tablet Place 1 tablet (10 mcg total) vaginally daily. 08/30/20   Koberlein, Steele Berg, MD  FASENRA 30 MG/ML SOSY SECOND SHIP: INJECT ONE SYRINGE UNDER THE SKIN AT WEEK 4 AND 8, THEN EVERY 8 WEEKS THEREAFTER. Patient taking differently: Inject 30 mg into the skin See admin instructions. Every 8 weeks 09/24/18   Garnet Sierras, DO  fluticasone [facility]) 50 MCG/ACT nasal spray Place 1 spray into both nostrils daily. 07/02/20   Billie Ruddy, MD  fluticasone (FLOVENT HFA) 110 MCG/ACT inhaler Inhale 2 puffs into the lungs 2 (two) times daily. During respiratory infections/asthma flares. 11/12/19   Garnet Sierras, DO  gabapentin (NEURONTIN) 300 MG capsule Take 300-900 mg by mouth See admin instructions. Take 1 tablet every AM, three tablets at bedtime 01/03/18   [provider]  hydrOXYzine (ATARAX/VISTARIL) 25 MG tablet Take 25 mg by mouth 3 (three) times daily as needed.    [provider]  meloxicam (MOBIC) 15 MG tablet TAKE 1 TABLET (15 MG TOTAL) BY MOUTH DAILY. 09/27/20   Laurey Morale, MD  montelukast (SINGULAIR) 10 MG tablet TAKE 1 TABLET BY MOUTH  DAILY 12/09/20   Garnet Sierras, DO  Multiple Vitamin (MULTIVITAMIN WITH MINERALS) TABS tablet Take 1 tablet by mouth daily.    [provider]  nystatin cream (MYCOSTATIN) Apply 1 application topically 2 (two) times daily. 06/14/20   Billie Ruddy, MD  pantoprazole (PROTONIX) 40 MG tablet TAKE 1 TABLET BY MOUTH 2 TIMES DAILY BEFORE A MEAL. 07/02/20   Caren Macadam, MD  Respiratory Therapy Supplies (FLUTTER) DEVI Use 3 times daily as directed 01/22/19   Mannam, Praveen, MD  Rimegepant Sulfate  (NURTEC) 75 MG TBDP Take by mouth.    [provider]  rizatriptan (MAXALT) 10 MG tablet TAKE 1 TABLET BY MOUTH ONCE AS NEEDED FOR MIGRAINE FOR UP TO 1 DOSE. MAY REPEAT IN 2 HOURS IF NEEDED 08/13/20   [provider]  rosuvastatin (CRESTOR) 5 MG tablet Take 1 tablet (5 mg total) by mouth at bedtime. 10/29/20   Koberlein, Steele Berg, MD  tiZANidine (ZANAFLEX) 4 MG tablet Take 4 mg by mouth every 6 (six) hours as needed for muscle spasms.    [provider]  traZODone (DESYREL) 100 MG tablet Take 1-2 tablets (100-200 mg total) by mouth at bedtime as needed for sleep. 07/02/20   Caren Macadam, MD  valACYclovir (VALTREX) 1000 MG tablet Take 1 tablet (1,000 mg total) by mouth 2 (two) times daily. 07/15/20   Caren Macadam, MD    Family History Family History  Problem Relation Age of Onset   Depression Mother    Lymphoma Mother 57   Hypertension Father    Heart attack Father 51   Hypertension Brother    High blood pressure Brother    High Cholesterol Brother    Healthy Daughter    Colon cancer Maternal Aunt 65   Esophageal cancer Neg Hx    Pancreatic cancer Neg Hx    Stomach cancer Neg Hx    Colon polyps Neg Hx    Rectal cancer Neg Hx     Social History Social History   Tobacco Use   Smoking status: Never   Smokeless tobacco: Never  Vaping Use   Vaping Use: Never used  Substance Use Topics   Alcohol use: Yes    Comment: occ 2-3 beers a month    Drug use: Never     Allergies   Patient has no known allergies.   Review of Systems Review of Systems  Constitutional:  Positive for fatigue and fever.  HENT:  Positive for sore throat and trouble swallowing.   Musculoskeletal:  Positive for myalgias.  All other systems reviewed and are negative.   Physical Exam Triage Vital Signs ED Triage Vitals  Enc Vitals Group     BP 02/06/21 1356 133/82     Pulse Rate 02/06/21 1356 98     Resp 02/06/21 1356 16     Temp 02/06/21 1356 98.7 F (37.1 C)      Temp Source 02/06/21 1356 Oral     SpO2 02/06/21 1356 93 %     Weight --      Height --      Head Circumference --      Peak Flow --      Pain Score 02/06/21 1354 10     Pain Loc --      Pain Edu? --      Excl. in [place]? --    No data found.  Updated Vital Signs BP 133/82   Pulse 98   Temp 98.7 F (37.1 C) (Oral)   Resp 16   LMP 12/20/2018   SpO2 93%   Visual Acuity Right Eye Distance:   Left Eye Distance:   Bilateral Distance:    Right Eye Near:   Left Eye Near:    Bilateral Near:     Physical Exam Vitals and nursing note reviewed.  Constitutional:      General: She is not in acute distress.    Appearance: Normal appearance. She is not ill-appearing, toxic-appearing or diaphoretic.  HENT:     Head: Normocephalic and atraumatic.     Nose: No congestion.     Mouth/Throat:     Pharynx: Uvula midline. Pharyngeal swelling, oropharyngeal exudate and posterior oropharyngeal erythema present.     Tonsils: Tonsillar exudate present. 2+ on the right. 2+ on the left.  Eyes:     Conjunctiva/sclera: Conjunctivae normal.  Cardiovascular:     Rate and Rhythm: Normal rate.     Pulses: Normal pulses.     Heart sounds: Normal heart sounds.  Pulmonary:     Effort: Pulmonary effort is normal.  Breath sounds: Normal breath sounds.  Abdominal:     General: Abdomen is flat.  Musculoskeletal:        General: Normal range of motion.     Cervical back: Normal range of motion.  Skin:    General: Skin is warm and dry.  Neurological:     General: No focal deficit present.     Mental Status: She is alert and oriented to person, place, and time.  Psychiatric:        Mood and Affect: Mood normal.     UC Treatments / Results  Labs (all labs ordered are listed, but only abnormal results are displayed) Labs Reviewed  CULTURE, GROUP A STREP [facility])  POCT RAPID STREP A, ED / UC    EKG   Radiology No results found.  Procedures Procedures (including critical care  time)  Medications Ordered in UC Medications - No data to display  Initial Impression / Assessment and Plan / UC Course  I have reviewed the triage vital signs and the nursing notes.  Pertinent labs & imaging results that were available during my care of the patient were reviewed by me and considered in my medical decision making (see chart for details).    Assessment negative for red flags or concerns.  Rapid strep negative, throat culture pending.  This is likely viral pharyngitis.  Declines COVID test at this time.  Tylenol and or ibuprofen as needed.  Discussed conservative symptom management as described in discharge instructions.  Encourage fluids and rest.  Follow-up with primary care or return as needed for reevaluation. Final Clinical Impressions(s) / UC Diagnoses   Final diagnoses:  Viral pharyngitis     Discharge Instructions      You can take Tylenol and/or Ibuprofen as needed for fever reduction and pain relief.     For sore throat: try warm salt water gargles, cepacol lozenges, throat spray, warm tea or water with lemon/honey, popsicles or ice, or OTC cold relief medicine for throat discomfort.    It is important to stay hydrated: drink plenty of fluids (water, gatorade/powerade/pedialyte, juices, or teas) to keep your throat moisturized and help further relieve irritation/discomfort.   Return or go to the Emergency Department if symptoms worsen or do not improve in the next few days.      ED Prescriptions   None    PDMP not reviewed this encounter.   Pearson Forster, NP 02/06/21 1426

## 2021-02-06 NOTE — ED Triage Notes (Signed)
Reports bronchitis 2-3 weeks ago, this improved. Wednesday, she got her flu and pneumonia shot, she has been sick since shots.  100.8 temp yesterday, sore throat, fatigue, body aches.   Denies N/ V / D.

## 2021-02-06 NOTE — Discharge Instructions (Signed)
You can take Tylenol and/or Ibuprofen as needed for fever reduction and pain relief.     For sore throat: try warm salt water gargles, cepacol lozenges, throat spray, warm tea or water with lemon/honey, popsicles or ice, or OTC cold relief medicine for throat discomfort.    It is important to stay hydrated: drink plenty of fluids (water, gatorade/powerade/pedialyte, juices, or teas) to keep your throat moisturized and help further relieve irritation/discomfort.   Return or go to the Emergency Department if symptoms worsen or do not improve in the next few days.

## 2021-02-08 ENCOUNTER — Other Ambulatory Visit: Payer: 59

## 2021-02-08 LAB — CULTURE, GROUP A STREP (THRC)

## 2021-02-09 ENCOUNTER — Other Ambulatory Visit: Payer: Self-pay | Admitting: Family Medicine

## 2021-02-09 ENCOUNTER — Telehealth: Payer: Self-pay | Admitting: Family Medicine

## 2021-02-09 ENCOUNTER — Other Ambulatory Visit: Payer: 59

## 2021-02-09 DIAGNOSIS — J0100 Acute maxillary sinusitis, unspecified: Secondary | ICD-10-CM

## 2021-02-09 DIAGNOSIS — H66011 Acute suppurative otitis media with spontaneous rupture of ear drum, right ear: Secondary | ICD-10-CM

## 2021-02-09 MED ORDER — AMOXICILLIN-POT CLAVULANATE 875-125 MG PO TABS
1.0000 | ORAL_TABLET | Freq: Two times a day (BID) | ORAL | 0 refills | Status: AC
Start: 2021-02-09 — End: 2021-02-16

## 2021-02-09 NOTE — Telephone Encounter (Signed)
Rx sent 

## 2021-02-09 NOTE — Telephone Encounter (Signed)
Patient informed of the message below.

## 2021-02-09 NOTE — Telephone Encounter (Signed)
Patient was calling to check the status of a refill for augmentin that Dr. Ethlyn Gallery was to send in for her (see last MyChart messages). Patient says that she called [id number] - [place], [place] - [address] and they stated that they did not receive anything as of yet.  Please advise.

## 2021-02-14 ENCOUNTER — Other Ambulatory Visit: Payer: 59

## 2021-02-15 ENCOUNTER — Other Ambulatory Visit: Payer: Self-pay

## 2021-02-15 ENCOUNTER — Other Ambulatory Visit: Payer: Self-pay | Admitting: Family Medicine

## 2021-02-15 ENCOUNTER — Ambulatory Visit
Admission: RE | Admit: 2021-02-15 | Discharge: 2021-02-15 | Disposition: A | Discharge status: Home / Self Care | Payer: 59 | Source: Ambulatory Visit | Attending: Family Medicine | Admitting: Family Medicine

## 2021-02-15 DIAGNOSIS — M5416 Radiculopathy, lumbar region: Secondary | ICD-10-CM

## 2021-02-15 IMAGING — XA DG FACET JT NEURO DESTRUCT EA ADD L/S W/ IMAG GUIDE
5 series · 5 of 5 positions shown · non-contrast
Comparison: none

CLINICAL DATA: Facet mediated low back pain. Excellent response to
right L5-S1 facet medial branch blocks a month ago. Patient presents
for RFA.
TECHNIQUE: The procedure, risks, benefits, and alternatives were explained to
the patient. Questions regarding the procedure were encouraged and
answered. The patient understands and consents to the procedure.

[Series 1: ortho standard · 1 of 1 slices shown (1 of 5)]
[im 1/1]
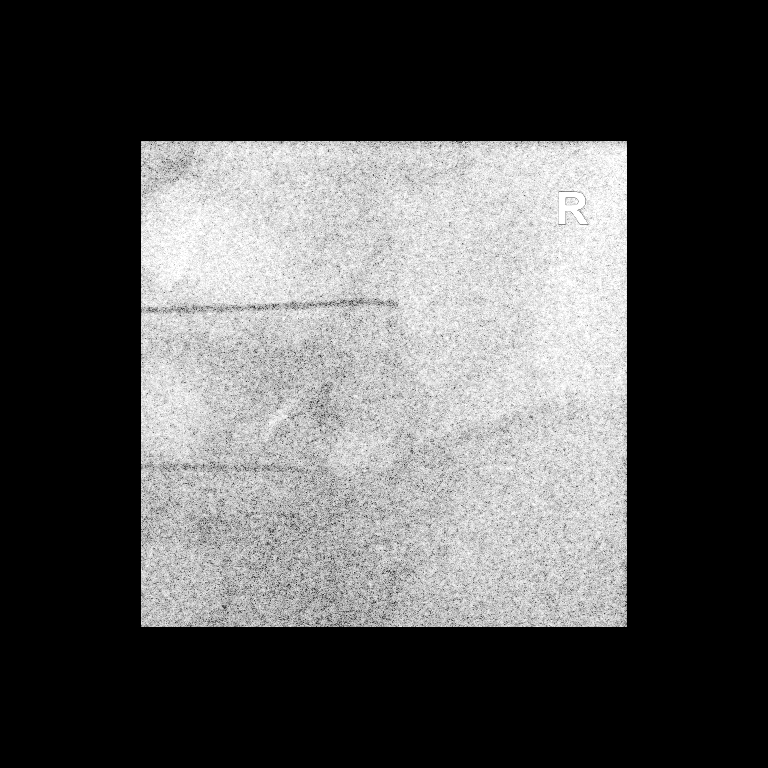

[Series 2: ortho standard · 1 of 1 slices shown (2 of 5)]
[im 1/1]
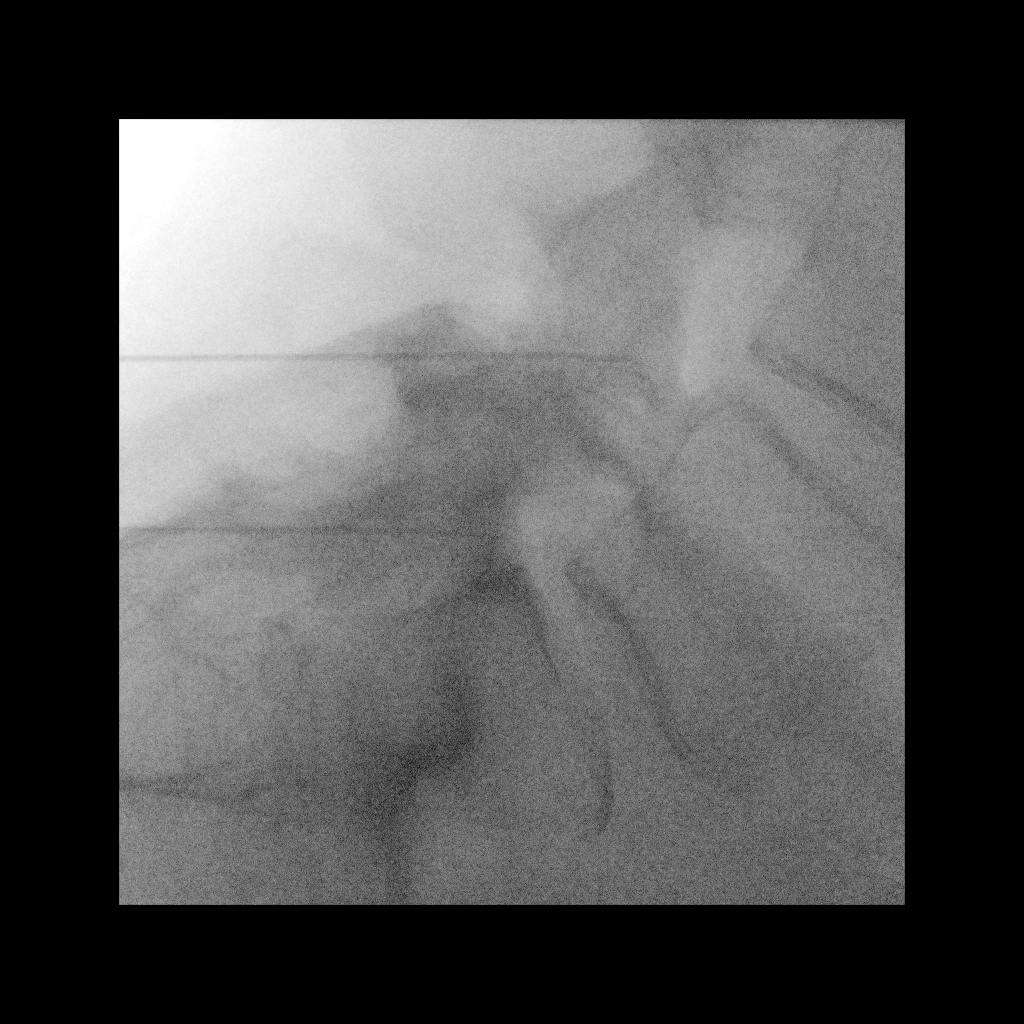

[Series 3: ortho standard · 1 of 1 slices shown (3 of 5)]
[im 1/1]
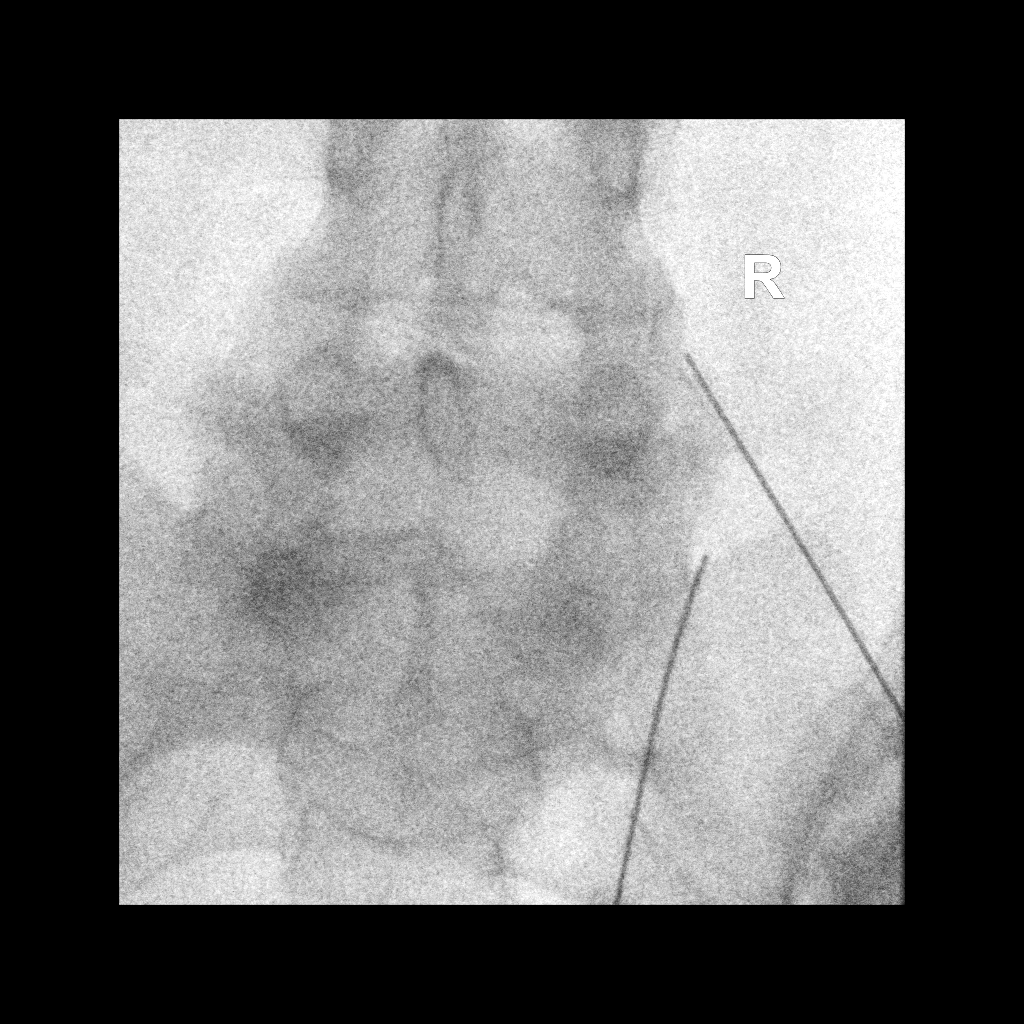

[Series 4: ortho standard · 1 of 1 slices shown (4 of 5)]
[im 1/1]
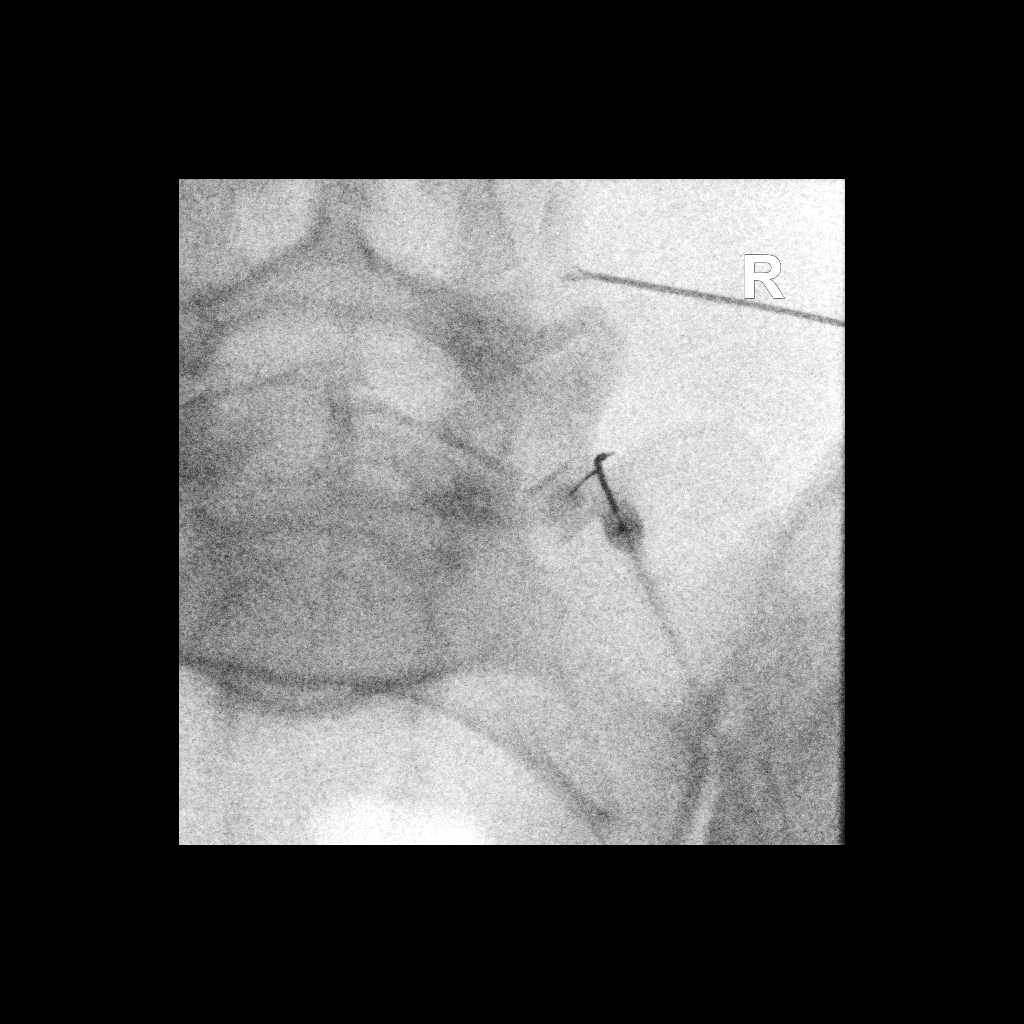

[Series 5: ortho standard · 1 of 1 slices shown (5 of 5)]
[im 1/1]
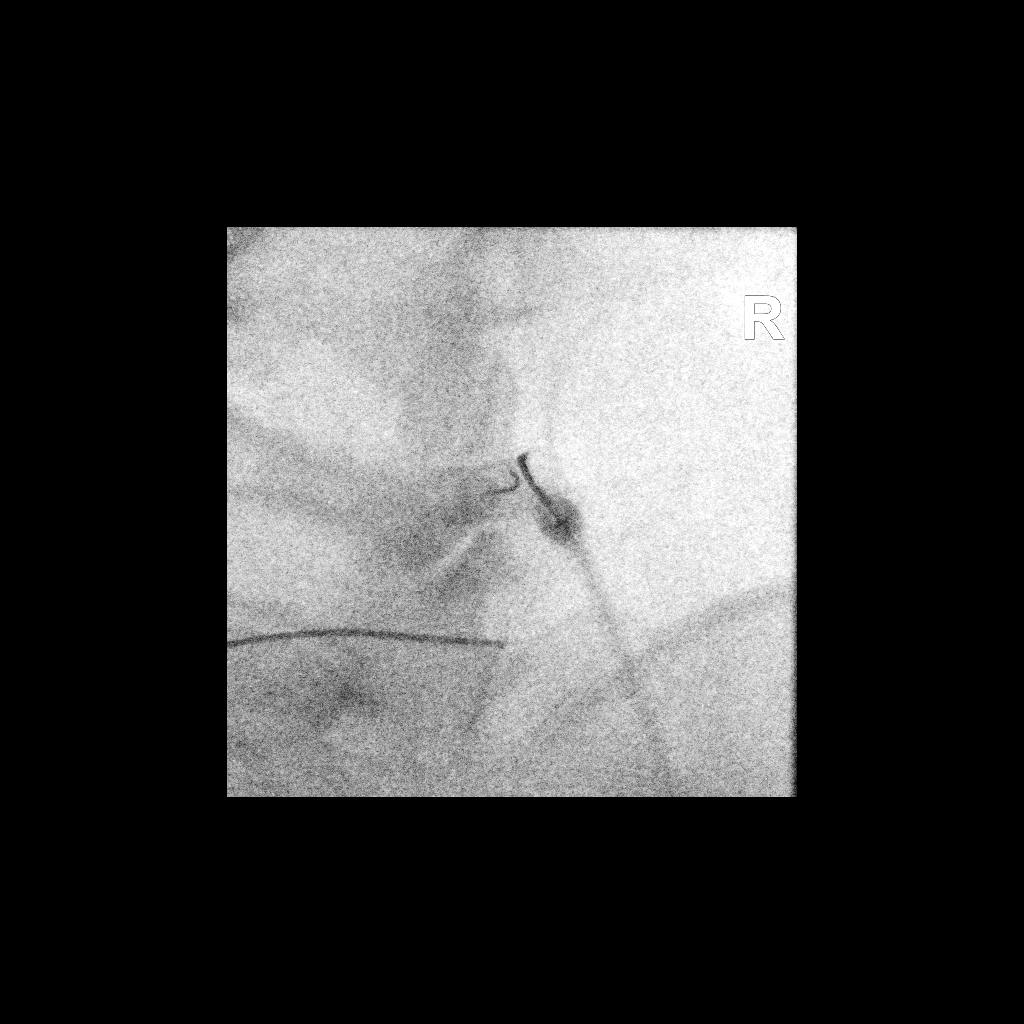

[5 of 5 positions shown; findings below may reference images not displayed]

EXAM:
FLUOROSCOPICALLY GUIDED FACET RADIOFREQUENCY ABLATION OF THE RIGHT
L5-S1 FACET JOINT

FLUOROSCOPY TIME:  Radiation Exposure Index (as provided by the
fluoroscopic device): 8.3 mGy

Fluoroscopy Time:  46 seconds

Number of Acquired Images:  1

SEDATION:
Moderate (conscious) sedation was employed during this procedure. A
total of Versed 4 mg and Fentanyl 50 mcg were administered
intravenously. 30 mg Toradol IV were administered at the conclusion
of the procedure.

Moderate Sedation Time: 27 minutes. The patient's level of
consciousness and vital signs were monitored continuously by
radiology nursing throughout the procedure under my direct
supervision.
A grounding pad was placed on the right leg and confirmed visually.

An appropriate skin entry site was determined fluoroscopically and
marked. Site prepped with betadine, draped in usual sterile fashion,
and infiltrated locally with 1% lidocaine.

RIGHT L4 MEDIAL BRANCH RFA: A posterior oblique approach was taken
to the junction of the superior articular process and transverse
process on the right at L5 using a 20 gauge 15 cm [REDACTED] needle, to
lie along the course of the right L4 medial branch nerve. The 10 mm
active tip probe was placed through the needle, with subsequent
detection of low impedance. Sensory and motor stimulation elicited
concordant symptoms at low voltage, without lower extremity sensory
or motor symptoms. Using a tandem needle, 1 mL of 1% lidocaine was
placed around the probe tip to mitigate the acute symptoms of the
ablation.

RIGHT L5 DORSAL RAMUS RFA: A posterior oblique approach was taken to
the junction of the S1 superior articular process and sacral ala on
the right using a 20 gauge 15 cm [REDACTED] needle, to lie along the
course of the right L5 dorsal ramus. The 10 mm active tip probe was
placed through the needle, with subsequent detection of low
impedance. Sensory and motor stimulation elicited concordant
symptoms at low voltage, without lower extremity sensory or motor
symptoms. Using a tandem needle, 1 mL of 1% lidocaine was placed
around the probe tip to mitigate the acute symptoms of the ablation.

Finally, the lesions were created uneventfully for 90 seconds at 80
degrees Celsius. Afterwards 40 mg Depo-Medrol mixed with 0.5 mL of
1% lidocaine were administered at the ablation sites. The needles
were subsequently removed.

The procedure was well-tolerated. After the procedure, the patient
was able to ambulate without difficulty, leg weakness, or numbness.
IMPRESSION: 1. Technically successful right L5-S1 facet radiofrequency ablation.

## 2021-02-15 IMAGING — XA DG FACET JT NEURO DESTRUCT SING L/S W/ IMAG GUIDE
5 series · 5 of 5 positions shown · non-contrast
Comparison: none

CLINICAL DATA: Facet mediated low back pain. Excellent response to
right L5-S1 facet medial branch blocks a month ago. Patient presents
for RFA.
TECHNIQUE: The procedure, risks, benefits, and alternatives were explained to
the patient. Questions regarding the procedure were encouraged and
answered. The patient understands and consents to the procedure.

[Series 1: ortho standard · 1 of 1 slices shown (1 of 5)]
[im 1/1]
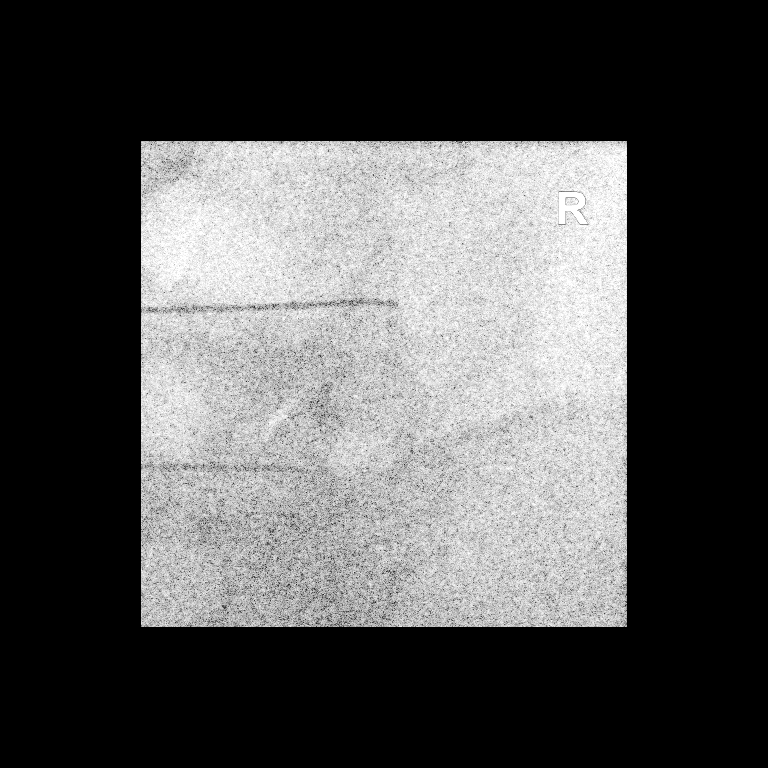

[Series 2: ortho standard · 1 of 1 slices shown (2 of 5)]
[im 1/1]
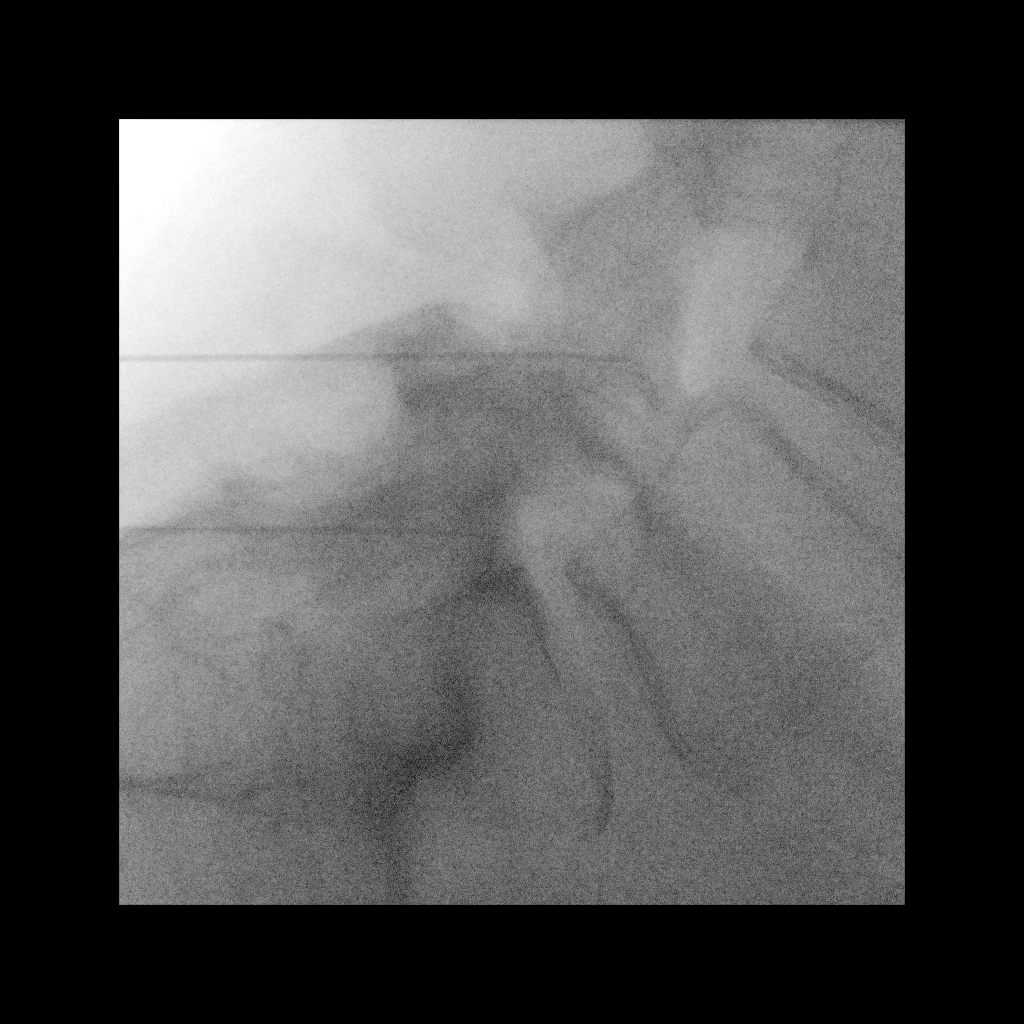

[Series 3: ortho standard · 1 of 1 slices shown (3 of 5)]
[im 1/1]
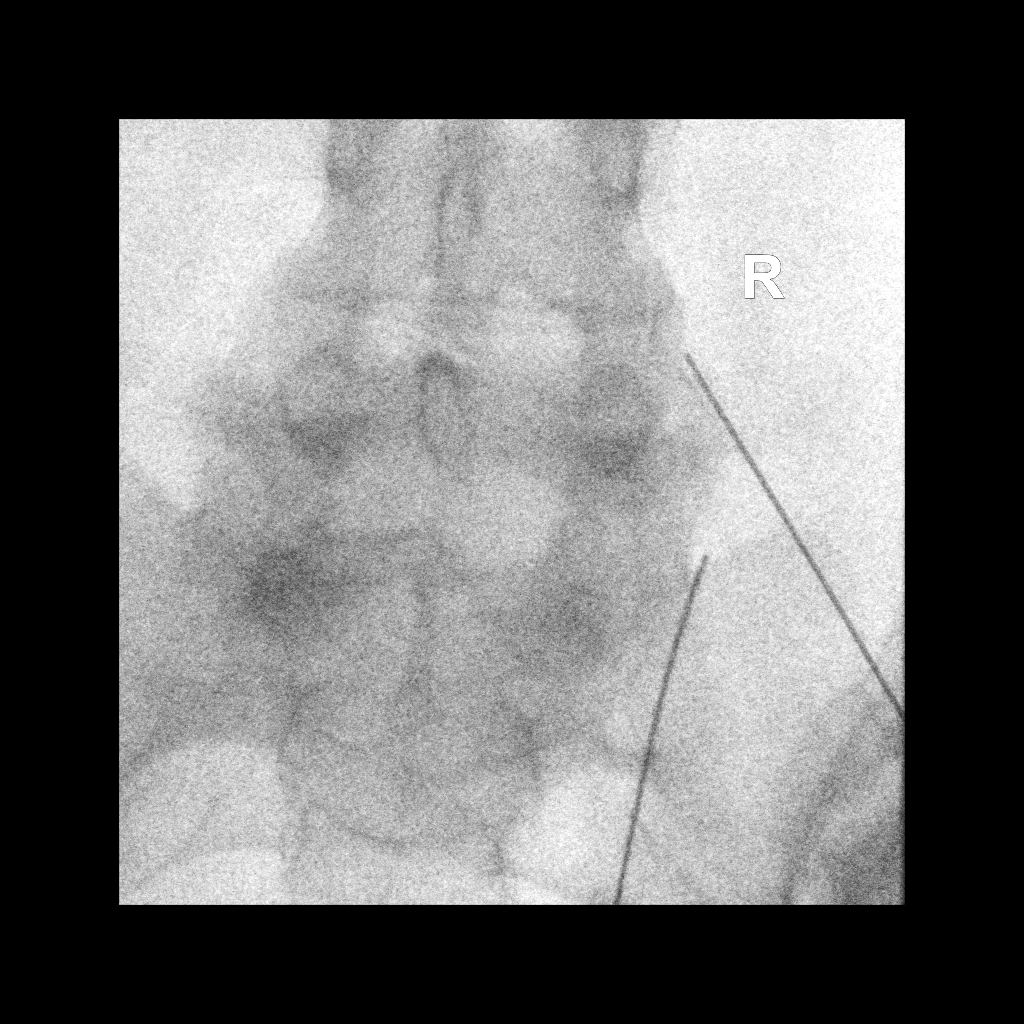

[Series 4: ortho standard · 1 of 1 slices shown (4 of 5)]
[im 1/1]
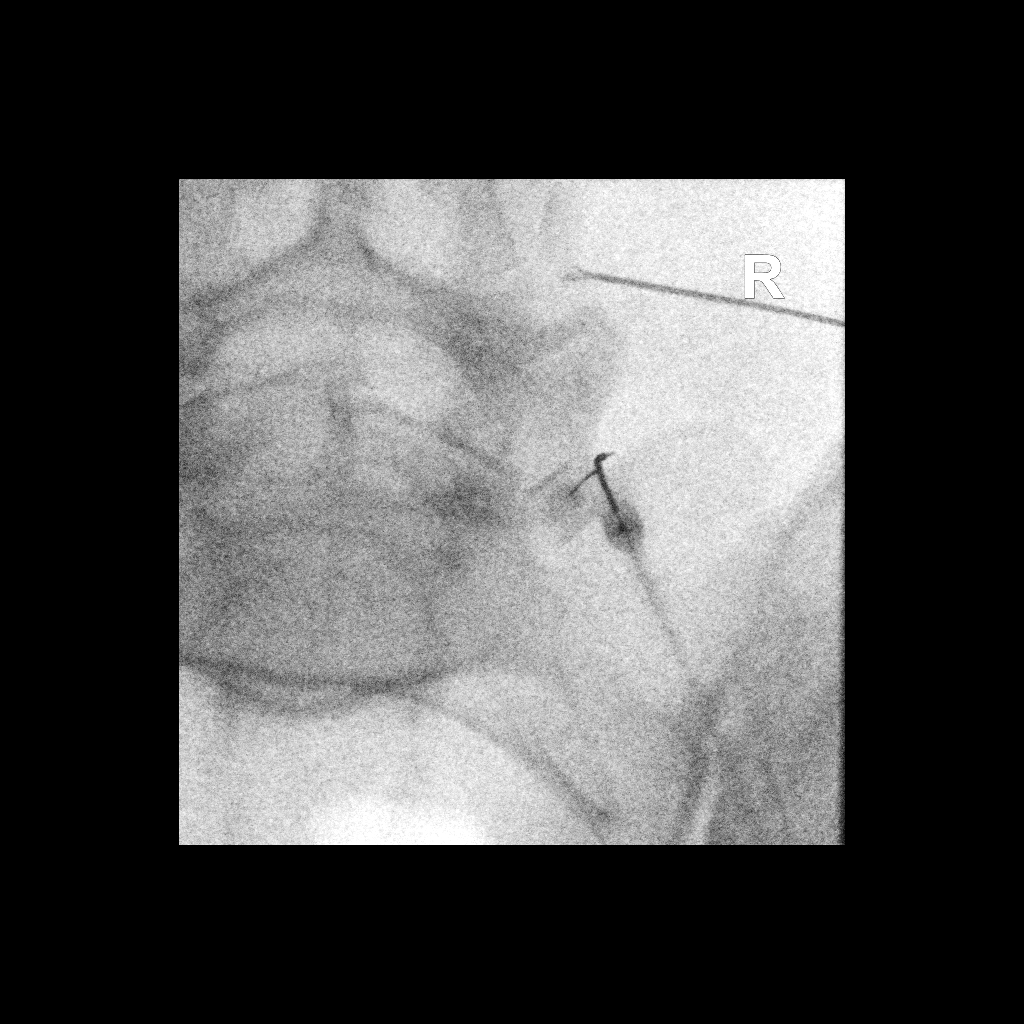

[Series 5: ortho standard · 1 of 1 slices shown (5 of 5)]
[im 1/1]
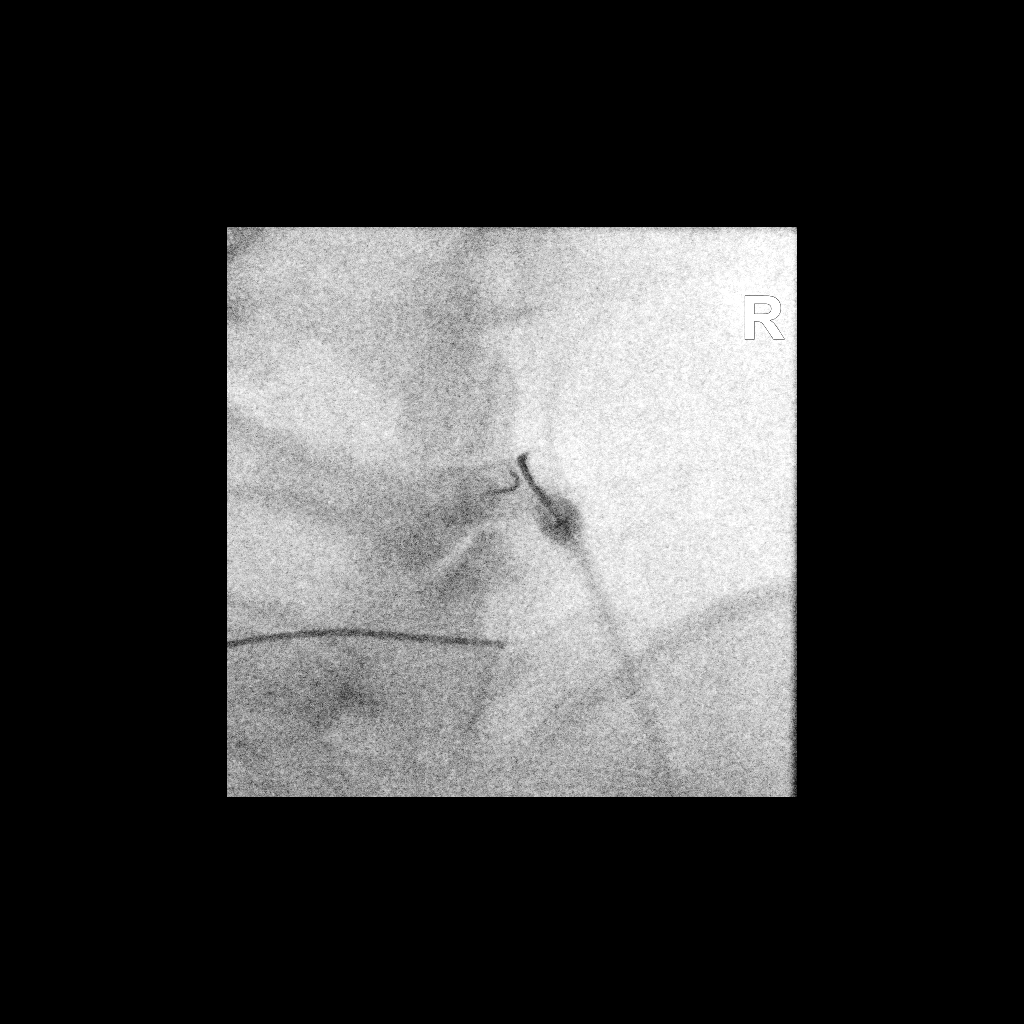

[5 of 5 positions shown; findings below may reference images not displayed]

EXAM:
FLUOROSCOPICALLY GUIDED FACET RADIOFREQUENCY ABLATION OF THE RIGHT
L5-S1 FACET JOINT

FLUOROSCOPY TIME:  Radiation Exposure Index (as provided by the
fluoroscopic device): 8.3 mGy

Fluoroscopy Time:  46 seconds

Number of Acquired Images:  1

SEDATION:
Moderate (conscious) sedation was employed during this procedure. A
total of Versed 4 mg and Fentanyl 50 mcg were administered
intravenously. 30 mg Toradol IV were administered at the conclusion
of the procedure.

Moderate Sedation Time: 27 minutes. The patient's level of
consciousness and vital signs were monitored continuously by
radiology nursing throughout the procedure under my direct
supervision.
A grounding pad was placed on the right leg and confirmed visually.

An appropriate skin entry site was determined fluoroscopically and
marked. Site prepped with betadine, draped in usual sterile fashion,
and infiltrated locally with 1% lidocaine.

RIGHT L4 MEDIAL BRANCH RFA: A posterior oblique approach was taken
to the junction of the superior articular process and transverse
process on the right at L5 using a 20 gauge 15 cm [REDACTED] needle, to
lie along the course of the right L4 medial branch nerve. The 10 mm
active tip probe was placed through the needle, with subsequent
detection of low impedance. Sensory and motor stimulation elicited
concordant symptoms at low voltage, without lower extremity sensory
or motor symptoms. Using a tandem needle, 1 mL of 1% lidocaine was
placed around the probe tip to mitigate the acute symptoms of the
ablation.

RIGHT L5 DORSAL RAMUS RFA: A posterior oblique approach was taken to
the junction of the S1 superior articular process and sacral ala on
the right using a 20 gauge 15 cm [REDACTED] needle, to lie along the
course of the right L5 dorsal ramus. The 10 mm active tip probe was
placed through the needle, with subsequent detection of low
impedance. Sensory and motor stimulation elicited concordant
symptoms at low voltage, without lower extremity sensory or motor
symptoms. Using a tandem needle, 1 mL of 1% lidocaine was placed
around the probe tip to mitigate the acute symptoms of the ablation.

Finally, the lesions were created uneventfully for 90 seconds at 80
degrees Celsius. Afterwards 40 mg Depo-Medrol mixed with 0.5 mL of
1% lidocaine were administered at the ablation sites. The needles
were subsequently removed.

The procedure was well-tolerated. After the procedure, the patient
was able to ambulate without difficulty, leg weakness, or numbness.
IMPRESSION: 1. Technically successful right L5-S1 facet radiofrequency ablation.

## 2021-02-15 MED ORDER — KETOROLAC TROMETHAMINE 30 MG/ML IJ SOLN
30.0000 mg | Freq: Once | INTRAMUSCULAR | Status: AC
  Administered 2021-02-15: 30 mg via INTRAVENOUS

## 2021-02-15 MED ORDER — MIDAZOLAM HCL 2 MG/2ML IJ SOLN
1.0000 mg | INTRAMUSCULAR | Status: DC | PRN
  Administered 2021-02-15 (×4): 1 mg via INTRAVENOUS

## 2021-02-15 MED ORDER — METHYLPREDNISOLONE ACETATE 40 MG/ML INJ SUSP (RADIOLOG: 80.0000 mg | Freq: Once | INTRAMUSCULAR | Status: DC

## 2021-02-15 MED ORDER — FENTANYL CITRATE PF 50 MCG/ML IJ SOSY
25.0000 ug | PREFILLED_SYRINGE | INTRAMUSCULAR | Status: DC | PRN
  Administered 2021-02-15: 50 ug via INTRAVENOUS

## 2021-02-15 MED ORDER — SODIUM CHLORIDE 0.9 % IV SOLN: INTRAVENOUS | Status: DC

## 2021-02-15 NOTE — Progress Notes (Signed)
Pt back in nursing recovery area. Pt still drowsy from procedure but will wake up when spoken to. Pt follows commands, talks in complete sentences and has no complaints at this time. Pt will be monitored until discharged by Radiologist.   

## 2021-02-15 NOTE — Discharge Instructions (Signed)
Radio Frequency Ablation Post Procedure Discharge Instructions ? ?May resume a regular diet and any medications that you routinely take (including pain medications). ?No driving day of procedure. ?Upon discharge go home and rest for at least 4 hours.  May use an ice pack as needed to injection sites on back. ?Remove bandades later, today. ? ? ? ?Please contact our office at [phone] for the following symptoms: ? ?Fever greater than 100 degrees ?Increased swelling, pain, or redness at injection site. ? ? ?Thank you for visiting  Imaging.  ?

## 2021-02-16 ENCOUNTER — Encounter: Payer: 59 | Admitting: Physical Therapy

## 2021-02-19 ENCOUNTER — Other Ambulatory Visit: Payer: Self-pay | Admitting: Allergy

## 2021-02-22 ENCOUNTER — Other Ambulatory Visit: Payer: Self-pay | Admitting: Family Medicine

## 2021-02-22 ENCOUNTER — Other Ambulatory Visit: Payer: Self-pay | Admitting: Allergy

## 2021-02-23 ENCOUNTER — Encounter: Payer: 59 | Admitting: Physical Therapy

## 2021-02-28 ENCOUNTER — Ambulatory Visit (INDEPENDENT_AMBULATORY_CARE_PROVIDER_SITE_OTHER): Payer: 59 | Admitting: Allergy

## 2021-02-28 ENCOUNTER — Other Ambulatory Visit: Payer: Self-pay

## 2021-02-28 ENCOUNTER — Encounter: Payer: Self-pay | Admitting: Allergy

## 2021-02-28 VITALS — BP 120/78 | HR 91 | Temp 98.2°F | Resp 18 | Ht 65.5 in | Wt 252.0 lb

## 2021-02-28 DIAGNOSIS — K219 Gastro-esophageal reflux disease without esophagitis: Secondary | ICD-10-CM

## 2021-02-28 DIAGNOSIS — J310 Chronic rhinitis: Secondary | ICD-10-CM | POA: Diagnosis not present

## 2021-02-28 DIAGNOSIS — Z8709 Personal history of other diseases of the respiratory system: Secondary | ICD-10-CM | POA: Diagnosis not present

## 2021-02-28 DIAGNOSIS — J4550 Severe persistent asthma, uncomplicated: Secondary | ICD-10-CM

## 2021-02-28 DIAGNOSIS — J9811 Atelectasis: Secondary | ICD-10-CM | POA: Diagnosis not present

## 2021-02-28 MED ORDER — BUDESONIDE 0.5 MG/2ML IN SUSP: 0.5000 mg | Freq: Two times a day (BID) | RESPIRATORY_TRACT | 2 refills | Status: DC

## 2021-02-28 MED ORDER — ALBUTEROL SULFATE (2.5 MG/3ML) 0.083% IN NEBU: 2.5000 mg | INHALATION_SOLUTION | RESPIRATORY_TRACT | 2 refills | Status: DC | PRN

## 2021-02-28 NOTE — Patient Instructions (Addendum)
Asthma/atelectasis Will switch from [place] to [place] which is a every 4 week injection in the office. Handout given.  Tammy will be in touch with you.  Daily controller medication(s): Symbicort 145mcg 2 puffs twice a day.  Continue Singulair (montelukast) 10mg  daily at night. Do the flutter valve and incentive spirometer. During upper respiratory infections/asthma flares:  Start Budesonide 0.5mg  nebulizer twice a day for 1-2 weeks until your breathing symptoms return to baseline.  Pretreat with albuterol 2 puffs or albuterol nebulizer.  If you need to use your albuterol nebulizer machine back to back within 15-30 minutes with no relief then please go to the ER/urgent care for further evaluation.  May use albuterol rescue inhaler 2 puffs every 4 to 6 hours as needed for shortness of breath, chest tightness, coughing, and wheezing. May use albuterol rescue inhaler 2 puffs 5 to 15 minutes prior to strenuous physical activities. Monitor frequency of use.  Asthma control goals:  Full participation in all desired activities (may need albuterol before activity) Albuterol use two times or less a week on average (not counting use with activity) Cough interfering with sleep two times or less a month Oral steroids no more than once a year No hospitalizations  Keep track of infections/bronchitis.  Chronic rhinitis May use Flonase (fluticasone) nasal spray 1 spray per nostril twice a day as needed for nasal congestion.  May use azelastine nasal spray 1-2 sprays per nostril twice a day as needed for runny nose/drainage. Nasal saline spray (i.e., Simply Saline) or nasal saline lavage (i.e., NeilMed) is recommended as needed and prior to medicated nasal sprays.   Gastroesophageal reflux disease Continue reflux lifestyle modifications and diet.  May use Protonix only if needed.   Follow up in 4 months or sooner if needed.  Will get bloodwork at next visit if still having infections.

## 2021-02-28 NOTE — Assessment & Plan Note (Signed)
Past history - Spiriva caused coughing. Normal alpha-1 level. Seen by pulmonary Interim history - 2 courses of steroids since the last visit.   Today's spirometry showed some restriction.  . Will switch from [place] to [place] which is a every 4 week injection in the office. o Handout given.  . Daily controller medication(s): Symbicort 132mcg 2 puffs twice a day.  . Continue Singulair (montelukast) 10mg  daily at night. . Do the flutter valve and incentive spirometer. . During upper respiratory infections/asthma flares:  o Start Budesonide 0.5mg  nebulizer twice a day for 1-2 weeks until your breathing symptoms return to baseline.  o Pretreat with albuterol 2 puffs or albuterol nebulizer.  o If you need to use your albuterol nebulizer machine back to back within 15-30 minutes with no relief then please go to the ER/urgent care for further evaluation.  . May use albuterol rescue inhaler 2 puffs every 4 to 6 hours as needed for shortness of breath, chest tightness, coughing, and wheezing. May use albuterol rescue inhaler 2 puffs 5 to 15 minutes prior to strenuous physical activities. Monitor frequency of use.  . Get spirometry at next visit.

## 2021-02-28 NOTE — Assessment & Plan Note (Signed)
Past history - All seasonal and perennial aeroallergen skin tests are negative despite a positive histamine control.  Tried Flonase, Xhance, Astelin, Xyzal, Periactin, and Claritin without adequate symptom relief.  Interm history - asymptomatic with no meds.  May use Flonase (fluticasone) nasal spray 1 spray per nostril twice a day as needed for nasal congestion.   May use azelastine nasal spray 1-2 sprays per nostril twice a day as needed for runny nose/drainage.  Nasal saline spray (i.e., Simply Saline) or nasal saline lavage (i.e., NeilMed) is recommended as needed and prior to medicated nasal sprays.

## 2021-02-28 NOTE — Progress Notes (Signed)
Follow Up Note  RE: Alyssa Clay MRN: [id number] DOB: [birth year] Date of Office Visit: 02/28/2021  Referring provider: Caren Macadam, MD Primary care provider: Caren Macadam, MD  Chief Complaint: Follow-up (Patient in today for follow up states she had to go to ED in October and had bronchitis for the entire month.)  History of Present Illness: I had the pleasure of seeing Alyssa Clay for a follow up visit at the [facility] on 02/28/2021. She is a 55 y.o. female, who is being followed for asthma, atelectasis, history of frequent upper respiratory infection, nonallergic rhinitis and GERD. Her previous allergy office visit was on 08/13/2020 with Dr. Maudie Mercury. Today is a regular follow up visit.  Severe persistent asthma Patient got Covid-19 in April 2022 - this started with throat pain, coughing, shortness of breath, body aches. She was treated with prednisone and paxlovid which gave her side effects.  Patient was doing well up until September when she developed coughing, fevers for which she went to the ER and was given prednisone and cough medications which helped.  Currently on Symbicort 183mcg 2 puffs twice a day, Singulair at night.  Using albuterol  3 times a day during URIs and less now since feeling better.   Patient does have nebulizer machine at home but no medications. She did not use Asmanex during URI as she didn't have any of that at home.   Up to date with flu and Covid-19 vaccinations.  Patient skipped a dose of Fasenra while being sick. She has been on [place] for about 2+ years and not sure if helping.   Still doing flutter valve occasionally.  Patient needs to get sleep study done.    Atelectasis Not needed to use oxygen.   History of frequent upper respiratory infection Patient had 1 course of antibiotics since the last visit.    Nonallergic rhinitis Minimal symptoms and not using nasal sprays.    Gastroesophageal reflux  disease Using Protonix as needed.  Patient has been having a lot of back pain and had a procedure done which did not help the symptoms. This is affecting her ability to go exercise.    11/04/2020 ER visit: "Alyssa Clay is a 56 y.o. female with a past medical history significant for asthma, right-sided diaphragm paralysis, GERD, chronic back pain, and recent diagnosis of COVID-19 who presents with continued cough, shortness of breath, and some lightheadedness.  Patient reports that she has had waxing waning symptoms since being diagnosed with COVID 4 days ago.  She reports she is still having a loud cough and was feeling lightheaded.  She says that she has been taking her medications to help with her asthma and she feels her wheezing is improved but she is still having the cough and shortness of breath.  She is concerned that it could be the COVID worsening.  She reports no syncopal episodes but reports feeling lightheaded.  She reports no significant nausea, vomiting, or current diarrhea.  She denies any chest pain or palpitations.  Denies any abdominal pain.  On exam, lungs are remarkably clear without significant rhonchi or rales or wheezing.  Chest was nontender, abdomen nontender, back nontender on my exam.  Patient is slightly tachycardic on my exam but is otherwise not hypoxic.  Oxygen saturations are in the 90s.  Patient does report that at home several times she had oxygen saturations that dipped into the 80s but it was brief.  She denies any  new leg pain or leg swelling he denies any chest pain, she has low suspicion for pulmonary embolism and DVT and I agree based on her exam and symptoms.  Patient will get some screening labs to make sure there is not significant electrolyte abnormalities given the lightheadedness in the setting of COVID however we will get a chest x-ray to look for development of pneumonia or other acute abnormality.  Suspect this is her COVID causing her symptoms as  opposed to her asthma given her reassuring lungs.  If work-up is reassuring, anticipate PCP follow-up.   Work-up overall reassuring.  Mild hypokalemia we discussed.  No evidence of pneumonia.  Patient did not have hypoxia during her stay here for over 6 hours.  Had a discussion and although she had some transient hypoxia at home, we do feel she is safe for discharge home and she agrees.  Patient requested something to help with her asthma exacerbation and will give a burst of steroids for the next 5 days.  She will follow-up with her PCP and continue outpatient management.  She had other questions or concerns and was discharged in good condition with understanding return precautions."  01/19/2021 ER visit: "Pt presents to the ED today with sob.  The pt has had a sore throat and cough since Thursday, 9/22.  She went to UC on the 26th and was checked for covid/flu and that was negative.  Pt was put on Augmentin.  She has been more sob since yesterday evening.  Pt has a hx of asthma and has been using her inhaler without improvement."  Assessment and Plan: Alyssa Clay is a 55 y.o. female with: Severe persistent asthma Past history - Spiriva caused coughing. Normal alpha-1 level. Seen by pulmonary Interim history - 2 courses of steroids since the last visit.  Today's spirometry showed some restriction.  Will switch from [place] to [place] which is a every 4 week injection in the office. Handout given.  Daily controller medication(s): Symbicort 16mcg 2 puffs twice a day.  Continue Singulair (montelukast) 10mg  daily at night. Do the flutter valve and incentive spirometer. During upper respiratory infections/asthma flares:  Start Budesonide 0.5mg  nebulizer twice a day for 1-2 weeks until your breathing symptoms return to baseline.  Pretreat with albuterol 2 puffs or albuterol nebulizer.  If you need to use your albuterol nebulizer machine back to back within 15-30 minutes with no relief then please go to  the ER/urgent care for further evaluation.  May use albuterol rescue inhaler 2 puffs every 4 to 6 hours as needed for shortness of breath, chest tightness, coughing, and wheezing. May use albuterol rescue inhaler 2 puffs 5 to 15 minutes prior to strenuous physical activities. Monitor frequency of use.  Get spirometry at next visit.  Atelectasis Past history - 9/9/202 CT chest showed: 1. Prominent elevation of the right hemidiaphragm with associated right lung base atelectasis including complete right middle lobe atelectasis. Right hemidiaphragm paralysis not excluded. Saw pulm. Interim history - doing well with no oxygen. Monitor symptoms and pulse ox. Use oxygen as needed. Encouraged using flutter valve and incentive spirometer.   History of frequent upper respiratory infection Past history - frequent bronchitis in her lifetime. Normal basic immune evaluation. Saw rheumatology and ID. Interim history - 1 course of antibiotics. Keep track of infections/bronchitis. Consider repeating immune labwork - last time was drawn in 2020.   Nonallergic rhinitis Past history - All seasonal and perennial aeroallergen skin tests are negative despite a positive histamine control.  Tried Flonase, Xhance, Astelin, Xyzal, Periactin, and Claritin without adequate symptom relief.  Interm history - asymptomatic with no meds. May use Flonase (fluticasone) nasal spray 1 spray per nostril twice a day as needed for nasal congestion.  May use azelastine nasal spray 1-2 sprays per nostril twice a day as needed for runny nose/drainage. Nasal saline spray (i.e., Simply Saline) or nasal saline lavage (i.e., NeilMed) is recommended as needed and prior to medicated nasal sprays.  Gastroesophageal reflux disease Stable.  Continue reflux lifestyle modifications and diet.  May use Protonix as needed.  Return in about 4 months (around 06/28/2021).  Meds ordered this encounter  Medications   budesonide (PULMICORT) 0.5  MG/2ML nebulizer solution    Sig: Take 2 mLs (0.5 mg total) by nebulization in the morning and at bedtime. Take during upper respiratory infections for 1-2 weeks at a time.    Dispense:  60 mL    Refill:  2   albuterol (PROVENTIL) (2.5 MG/3ML) 0.083% nebulizer solution    Sig: Take 3 mLs (2.5 mg total) by nebulization every 4 (four) hours as needed for wheezing or shortness of breath (coughing fits).    Dispense:  75 mL    Refill:  2    Lab Orders  No laboratory test(s) ordered today    Diagnostics: Spirometry:  Tracings reviewed. Her effort: Good reproducible efforts. FVC: 1.90L FEV1: 1.54L, 64% predicted FEV1/FVC ratio: 81% Interpretation: Spirometry consistent with possible restrictive disease.  Please see scanned spirometry results for details.  Medication List:  Current Outpatient Medications  Medication Sig Dispense Refill   albuterol (PROAIR HFA) 108 (90 Base) MCG/ACT inhaler Inhale 2 puffs into the lungs every 4 (four) hours as needed for wheezing or shortness of breath. 1 each 1   albuterol (PROVENTIL) (2.5 MG/3ML) 0.083% nebulizer solution Take 3 mLs (2.5 mg total) by nebulization every 4 (four) hours as needed for wheezing or shortness of breath (coughing fits). 75 mL 2   Atogepant (QULIPTA PO) Take by mouth.     budesonide (PULMICORT) 0.5 MG/2ML nebulizer solution Take 2 mLs (0.5 mg total) by nebulization in the morning and at bedtime. Take during upper respiratory infections for 1-2 weeks at a time. 60 mL 2   budesonide-formoterol (SYMBICORT) 160-4.5 MCG/ACT inhaler USE 2 INHALATIONS BY MOUTH  TWICE DAILY 30.6 g 3   buPROPion (WELLBUTRIN XL) 150 MG 24 hr tablet TAKE 1 TABLET BY MOUTH  DAILY 90 tablet 1   calcium carbonate (OSCAL) 1500 (600 Ca) MG TABS tablet Take 600 mg of elemental calcium by mouth 2 (two) times daily with a meal.     cyclobenzaprine (FLEXERIL) 10 MG tablet Take 10 mg by mouth 3 (three) times daily as needed (migraines).     DULoxetine (CYMBALTA) 60 MG  capsule TAKE 1 CAPSULE BY MOUTH  DAILY 90 capsule 3   Estradiol (YUVAFEM) 10 MCG TABS vaginal tablet Place 1 tablet (10 mcg total) vaginally daily. 8 tablet 0   FASENRA 30 MG/ML SOSY SECOND SHIP: INJECT ONE SYRINGE UNDER THE SKIN AT WEEK 4 AND 8, THEN EVERY 8 WEEKS THEREAFTER. (Patient taking differently: Inject 30 mg into the skin See admin instructions. Every 8 weeks) 1 Syringe 8   gabapentin (NEURONTIN) 300 MG capsule Take 300-900 mg by mouth See admin instructions. Take 1 tablet every AM, three tablets at bedtime     hydrOXYzine (ATARAX/VISTARIL) 25 MG tablet Take 25 mg by mouth 3 (three) times daily as needed.     meloxicam (MOBIC) 15 MG  tablet TAKE 1 TABLET (15 MG TOTAL) BY MOUTH DAILY. 30 tablet 1   montelukast (SINGULAIR) 10 MG tablet TAKE 1 TABLET BY MOUTH  DAILY 30 tablet 0   Multiple Vitamin (MULTIVITAMIN WITH MINERALS) TABS tablet Take 1 tablet by mouth daily.     nystatin cream (MYCOSTATIN) Apply 1 application topically 2 (two) times daily. 30 g 0   pantoprazole (PROTONIX) 40 MG tablet TAKE 1 TABLET BY MOUTH 2 TIMES DAILY BEFORE A MEAL. 180 tablet 1   Respiratory Therapy Supplies (FLUTTER) DEVI Use 3 times daily as directed 1 each 0   rizatriptan (MAXALT) 10 MG tablet TAKE 1 TABLET BY MOUTH ONCE AS NEEDED FOR MIGRAINE FOR UP TO 1 DOSE. MAY REPEAT IN 2 HOURS IF NEEDED     rosuvastatin (CRESTOR) 5 MG tablet Take 1 tablet (5 mg total) by mouth at bedtime. 90 tablet 3   tiZANidine (ZANAFLEX) 4 MG tablet Take 4 mg by mouth every 6 (six) hours as needed for muscle spasms.     traZODone (DESYREL) 100 MG tablet Take 1-2 tablets (100-200 mg total) by mouth at bedtime as needed for sleep. 180 tablet 3   valACYclovir (VALTREX) 1000 MG tablet Take 1 tablet (1,000 mg total) by mouth 2 (two) times daily. 180 tablet 1   Current Facility-Administered Medications  Medication Dose Route Frequency Provider Last Rate Last Admin   0.9 %  sodium chloride infusion  500 mL Intravenous Once [place],  MD       Allergies: No Known Allergies I reviewed her past medical history, social history, family history, and environmental history and no significant changes have been reported from her previous visit.  Review of Systems  Constitutional:  Negative for appetite change, chills, fever and unexpected weight change.  HENT:  Negative for congestion and rhinorrhea.   Eyes:  Negative for itching.  Respiratory:  Negative for cough, chest tightness, shortness of breath and wheezing.   Gastrointestinal:  Negative for abdominal pain.  Skin:  Negative for rash.  Allergic/Immunologic: Positive for environmental allergies.  Neurological:  Positive for headaches.   Objective: BP 120/78   Pulse 91   Temp 98.2 F (36.8 C) (Temporal)   Resp 18   Ht 5' 5.5" (1.664 m)   Wt 252 lb (114.3 kg)   LMP 12/20/2018   SpO2 94%   BMI 41.30 kg/m  Body mass index is 41.3 kg/m. Repeat pulse 91. Physical Exam Vitals and nursing note reviewed.  Constitutional:      Appearance: Normal appearance. She is well-developed.  HENT:     Head: Normocephalic and atraumatic.     Right Ear: Tympanic membrane and external ear normal.     Left Ear: Tympanic membrane and external ear normal.     Nose: Nose normal.     Mouth/Throat:     Mouth: Mucous membranes are moist.     Pharynx: Oropharynx is clear.  Eyes:     Conjunctiva/sclera: Conjunctivae normal.  Cardiovascular:     Rate and Rhythm: Normal rate and regular rhythm.     Heart sounds: Normal heart sounds. No murmur heard. Pulmonary:     Effort: Pulmonary effort is normal.     Breath sounds: Normal breath sounds. No wheezing, rhonchi or rales.  Musculoskeletal:     Cervical back: Neck supple.  Skin:    General: Skin is warm.     Findings: No rash.  Neurological:     Mental Status: She is alert and oriented to person, place, and  time.  Psychiatric:        Behavior: Behavior normal.   Previous notes and tests were reviewed. The plan was reviewed  with the patient/family, and all questions/concerned were addressed.  It was my pleasure to see Alyssa Clay today and participate in her care. Please feel free to contact me with any questions or concerns.  Sincerely,  Rexene Alberts, DO Allergy & Immunology  [facility] office: [place] office: [phone]

## 2021-02-28 NOTE — Assessment & Plan Note (Signed)
Stable.   Continue reflux lifestyle modifications and diet.   May use Protonix as needed.

## 2021-02-28 NOTE — Assessment & Plan Note (Signed)
Past history - 9/9/202 CT chest showed: 1. Prominent elevation of the right hemidiaphragm with associated right lung base atelectasis including complete right middle lobe atelectasis. Right hemidiaphragm paralysis not excluded. Saw pulm. Interim history - doing well with no oxygen.  Monitor symptoms and pulse ox.  Use oxygen as needed.  Encouraged using flutter valve and incentive spirometer.

## 2021-02-28 NOTE — Assessment & Plan Note (Signed)
Past history - frequent bronchitis in her lifetime. Normal basic immune evaluation. Saw rheumatology and ID. Interim history - 1 course of antibiotics.  Keep track of infections/bronchitis.  Consider repeating immune labwork - last time was drawn in 2020.

## 2021-02-28 NOTE — Progress Notes (Signed)
Alyssa Clay [place] [address] [place] Phone: [phone] Subjective:   Alyssa Clay, am serving as a scribe for Dr. Hulan Saas. This visit occurred during the SARS-CoV-2 public health emergency.  Safety protocols were in place, including screening questions prior to the visit, additional usage of staff PPE, and extensive cleaning of exam room while observing appropriate contact time as indicated for disinfecting solutions.   I'm seeing this patient by the request  of:  Caren Macadam, MD  CC: back pain f/u  [id number]  11/29/2020 Patient has had difficulty with this previously.  Does have some facet arthritis.  Discussed icing regimen and home exercises.  Discussed topical anti-inflammatories.  Patient did respond well to the facet injections previously and I would encourage her to try this again.  Patient if she does get another 70% or greater improvement from it.  Be a candidate for possible radiofrequency ablation.  Patient will follow up with me again in 6 weeks.  No change in medications including the Cymbalta and the Flexeril  Update 03/01/2021 Alyssa Clay is a 55 y.o. female coming in with complaint of back pain. 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 Patient states ablation worked for about 5 days then started to wear off. Now is at a point where the pain is the same or even worse. Feels it when she is walking. Patient continues to have difficulty as well.  Patient is frustrated because she felt initially she was about 100% better but now unfortunately the pain is back and potentially even worsening.      Past Medical History:  Diagnosis Date   Allergy    Anemia    Asthma    Constipation    issues x the past month -? 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   Enlarged ovary 09/15/2020   noted on ct scan, per pt   FUO (fever of unknown origin) 10/21/2019    GERD (gastroesophageal reflux disease)    Hypertension    PAST hx    Migraine    Past Surgical History:  Procedure Laterality Date   APPENDECTOMY     BREAST REDUCTION SURGERY Bilateral 06/12/2019   Procedure: BREAST REDUCTION WITH LIPOSUCTION;  Surgeon: Wallace Going, DO;  Location: [facility];  Service: Plastics;  Laterality: Bilateral;  4 hours, please   CESAREAN SECTION  1994   DILATION AND CURETTAGE OF UTERUS     multiple due to miscarriages   KNEE ARTHROSCOPY Left 2013   LAPAROSCOPIC APPENDECTOMY N/A 12/08/2018   Procedure: APPENDECTOMY LAPAROSCOPIC;  Surgeon: Donnie Mesa, MD;  Location: [facility];  Service: General;  Laterality: N/A;   TENNIS ELBOW RELEASE/NIRSCHEL PROCEDURE  2009   WISDOM TOOTH EXTRACTION     Social History   Socioeconomic History   Marital status: Married    Spouse name: Not on file   Number of children: Not on file   Years of education: Not on file   Highest education level: Not on file  Occupational History   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  Tobacco Use   Smoking status: Never   Smokeless tobacco: Never  Vaping Use   Vaping Use: Never used  Substance and Sexual Activity   Alcohol use: Yes    Comment: occ 2-3 beers a month    Drug use: Never   Sexual activity: Yes    Partners: Male  Other Topics Concern   Not on file  Social History Narrative   Not on file   Social Determinants of Health   Financial Resource Strain: Not on file  Food Insecurity: Not on file  Transportation Needs: Not on file  Physical Activity: Not on file  Stress: Not on file  Social Connections: Not on file   No Known Allergies Family History  Problem Relation Age of Onset   Depression Mother    Lymphoma Mother 63   Hypertension Father    Heart attack Father 61   Hypertension Brother    High blood pressure Brother    High Cholesterol Brother    Healthy Daughter    Colon cancer Maternal Aunt 65   Esophageal cancer Neg Hx    Pancreatic cancer Neg Hx     Stomach cancer Neg Hx    Colon polyps Neg Hx    Rectal cancer Neg Hx       Current Outpatient Medications (Cardiovascular):    rosuvastatin (CRESTOR) 5 MG tablet, Take 1 tablet (5 mg total) by mouth at bedtime.   Current Outpatient Medications (Respiratory):    albuterol (PROAIR HFA) 108 (90 Base) MCG/ACT inhaler, Inhale 2 puffs into the lungs every 4 (four) hours as needed for wheezing or shortness of breath.   albuterol (PROVENTIL) (2.5 MG/3ML) 0.083% nebulizer solution, Take 3 mLs (2.5 mg total) by nebulization every 4 (four) hours as needed for wheezing or shortness of breath (coughing fits).   budesonide (PULMICORT) 0.5 MG/2ML nebulizer solution, Take 2 mLs (0.5 mg total) by nebulization in the morning and at bedtime. Take during upper respiratory infections for 1-2 weeks at a time.   budesonide-formoterol (SYMBICORT) 160-4.5 MCG/ACT inhaler, USE 2 INHALATIONS BY MOUTH  TWICE DAILY   FASENRA 30 MG/ML SOSY, SECOND SHIP: INJECT ONE SYRINGE UNDER THE SKIN AT WEEK 4 AND 8, THEN EVERY 8 WEEKS THEREAFTER. (Patient taking differently: Inject 30 mg into the skin See admin instructions. Every 8 weeks)   montelukast (SINGULAIR) 10 MG tablet, TAKE 1 TABLET BY MOUTH  DAILY   Current Outpatient Medications (Analgesics):    meloxicam (MOBIC) 15 MG tablet, Take 1 tablet (15 mg total) by mouth daily.   Atogepant (QULIPTA PO), Take by mouth.   meloxicam (MOBIC) 15 MG tablet, TAKE 1 TABLET (15 MG TOTAL) BY MOUTH DAILY.   rizatriptan (MAXALT) 10 MG tablet, TAKE 1 TABLET BY MOUTH ONCE AS NEEDED FOR MIGRAINE FOR UP TO 1 DOSE. MAY REPEAT IN 2 HOURS IF NEEDED     Current Outpatient Medications (Other):    buPROPion (WELLBUTRIN XL) 150 MG 24 hr tablet, TAKE 1 TABLET BY MOUTH  DAILY   calcium carbonate (OSCAL) 1500 (600 Ca) MG TABS tablet, Take 600 mg of elemental calcium by mouth 2 (two) times daily with a meal.   cyclobenzaprine (FLEXERIL) 10 MG tablet, Take 10 mg by mouth 3 (three) times daily as  needed (migraines).   DULoxetine (CYMBALTA) 60 MG capsule, TAKE 1 CAPSULE BY MOUTH  DAILY   Estradiol (YUVAFEM) 10 MCG TABS vaginal tablet, Place 1 tablet (10 mcg total) vaginally daily.   gabapentin (NEURONTIN) 300 MG capsule, Take 300-900 mg by mouth See admin instructions. Take 1 tablet every AM, three tablets at bedtime   hydrOXYzine (ATARAX/VISTARIL) 25 MG tablet, Take 25 mg by mouth 3 (three) times daily as needed.   Multiple Vitamin (MULTIVITAMIN WITH MINERALS) TABS tablet, Take 1 tablet by mouth daily.   nystatin cream (MYCOSTATIN), Apply 1 application topically 2 (  two) times daily.   pantoprazole (PROTONIX) 40 MG tablet, TAKE 1 TABLET BY MOUTH 2 TIMES DAILY BEFORE A MEAL.   Respiratory Therapy Supplies (FLUTTER) DEVI, Use 3 times daily as directed   tiZANidine (ZANAFLEX) 4 MG tablet, Take 4 mg by mouth every 6 (six) hours as needed for muscle spasms.   traZODone (DESYREL) 100 MG tablet, Take 1-2 tablets (100-200 mg total) by mouth at bedtime as needed for sleep.   valACYclovir (VALTREX) 1000 MG tablet, Take 1 tablet (1,000 mg total) by mouth 2 (two) times daily.  Current Facility-Administered Medications (Other):    0.9 %  sodium chloride infusion   Reviewed prior external information including notes and imaging from  primary care provider As well as notes that were available from care everywhere and other healthcare systems.  Past medical history, social, surgical and family history all reviewed in electronic medical record.  No pertanent information unless stated regarding to the chief complaint.   Review of Systems:  No headache, visual changes, nausea, vomiting, diarrhea, constipation, dizziness, abdominal pain, skin rash, fevers, chills, night sweats, weight loss, swollen lymph nodes, body aches, joint swelling, chest pain, shortness of breath, mood changes. POSITIVE muscle aches  Objective  Blood pressure 136/82, pulse 100, height 5\' 5"  (1.651 m), weight 255 lb (115.7 kg),  last menstrual period 12/20/2018, SpO2 94 %.   General: No apparent distress alert and oriented x3 mood and affect normal, dressed appropriately.  HEENT: Pupils equal, extraocular movements intact  Respiratory: Patient's speak in full sentences and does not appear short of breath  Cardiovascular: No lower extremity edema, non tender, no erythema  Gait normal with good balance and coordination.  MSK: Low back exam does have some loss of lordosis.  Severe tenderness to palpation in the paraspinal musculature of the lumbar spine right greater than left.  Seems to be more severe over the right greater trochanteric area as well as over the right sacroiliac joint.  Positive FABER test.  Worsening pain with any extension of the back.  After verbal consent patient was prepped with alcohol swabs and with a 21-gauge 2 inch needle patient was injected with 1 cc 0.5% Marcaine and 1 cc of Kenalog 40 mg/mL.  Into the right sacroiliac joint from posterior approach.  Mild blood loss.  Band-Aid placed.  Postinjection instructions given.    Impression and Recommendations:     The above documentation has been reviewed and is accurate and complete Alyssa Pulley, DO

## 2021-03-01 ENCOUNTER — Encounter: Payer: Self-pay | Admitting: Family Medicine

## 2021-03-01 ENCOUNTER — Ambulatory Visit (INDEPENDENT_AMBULATORY_CARE_PROVIDER_SITE_OTHER): Payer: 59 | Admitting: Family Medicine

## 2021-03-01 ENCOUNTER — Telehealth: Payer: Self-pay | Admitting: *Deleted

## 2021-03-01 DIAGNOSIS — M533 Sacrococcygeal disorders, not elsewhere classified: Secondary | ICD-10-CM

## 2021-03-01 DIAGNOSIS — M5416 Radiculopathy, lumbar region: Secondary | ICD-10-CM | POA: Diagnosis not present

## 2021-03-01 MED ORDER — MELOXICAM 15 MG PO TABS: 15.0000 mg | ORAL_TABLET | Freq: Every day | ORAL | 2 refills | Status: DC

## 2021-03-01 NOTE — Assessment & Plan Note (Signed)
Patient has more of the facet arthropathy.  It did respond where patient did have 100% relief initially after the radiofrequency ablation but unfortunately the pain is already coming back after a month.  We discussed with patient it could continue to get better but we will see.  Attempted a sacroiliac injection to see if this will be beneficial.  Will refer patient to see if she is a candidate for a spinal stimulator.  With patient's MRI findings I do not think she is a surgical candidate at the moment.  Refill of the meloxicam for this chronic problem with exacerbation and follow-up with me again in 6 to 8 weeks

## 2021-03-01 NOTE — Telephone Encounter (Signed)
-----   Message from Garnet Sierras, DO sent at 02/28/2021  1:43 PM EST ----- Lynelle Smoke, please start PA for Tezspire for asthma. Patient is on Fasenra but still having asthma exacerbations requiring oral prednisone. Thank you.

## 2021-03-01 NOTE — Patient Instructions (Addendum)
Injection today Take Meloxicam in 7 to 10 day burst Ref Dr. Corky [facility] See you again in 6-8 weeks, but send a message in a couple of weeks

## 2021-03-01 NOTE — Telephone Encounter (Signed)
Called patient and advised approval and submit for Tezspire. Also confirmed email address so patient can receive copay card info. I advised her I would reach out once delivery set up to make appt to start therapy

## 2021-03-01 NOTE — Telephone Encounter (Signed)
Noted  

## 2021-03-01 NOTE — Assessment & Plan Note (Signed)
Patient likely is not having as much radicular symptoms but continues to have pain especially with extension of the back secondary to probably more of the facet arthropathy.  Patient did not respond as well to the radiofrequency ablation but has been less than a month that it could potentially make some improvement still.  At the same time I would like to refer the patient to see if patient is a candidate for potential spinal stimulator but otherwise we will continue same medications.  Did do a sacroiliac joint injection today and we will see how patient responds.  Follow-up with me again in 6 to 8 weeks

## 2021-03-02 ENCOUNTER — Ambulatory Visit: Payer: 59 | Attending: Gastroenterology | Admitting: Physical Therapy

## 2021-03-02 ENCOUNTER — Other Ambulatory Visit: Payer: Self-pay

## 2021-03-02 ENCOUNTER — Encounter: Payer: Self-pay | Admitting: Physical Therapy

## 2021-03-02 DIAGNOSIS — M6281 Muscle weakness (generalized): Secondary | ICD-10-CM | POA: Diagnosis present

## 2021-03-02 DIAGNOSIS — R293 Abnormal posture: Secondary | ICD-10-CM | POA: Diagnosis present

## 2021-03-02 DIAGNOSIS — R278 Other lack of coordination: Secondary | ICD-10-CM | POA: Insufficient documentation

## 2021-03-02 NOTE — Therapy (Signed)
[facility] @ [place], [place], [address] Phone: [phone]   Fax:  [phone]  Physical Therapy Treatment  Patient Details  Name: Alyssa Clay MRN: [id number] Date of Birth: 1966/02/11 Referring Provider (PT): Dr. Thornton Park   Encounter Date: 03/02/2021   PT End of Session - 03/02/21 1529     Visit Number 2    Date for PT Re-Evaluation 04/03/21    Authorization Type UHC    Authorization - Visit Number 2    Authorization - Number of Visits 25    PT Start Time 1443    PT Stop Time 1525    PT Time Calculation (min) 40 min    Activity Tolerance Patient tolerated treatment well    Behavior During Therapy [facility] for tasks assessed/performed             Past Medical History:  Diagnosis Date   Allergy    Anemia    Asthma    Constipation    issues x the past month -? 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   Enlarged ovary 09/15/2020   noted on ct scan, per pt   FUO (fever of unknown origin) 10/21/2019   GERD (gastroesophageal reflux disease)    Hypertension    PAST hx    Migraine     Past Surgical History:  Procedure Laterality Date   APPENDECTOMY     BREAST REDUCTION SURGERY Bilateral 06/12/2019   Procedure: BREAST REDUCTION WITH LIPOSUCTION;  Surgeon: Wallace Going, DO;  Location: [facility];  Service: Plastics;  Laterality: Bilateral;  4 hours, please   CESAREAN SECTION  1994   DILATION AND CURETTAGE OF UTERUS     multiple due to miscarriages   KNEE ARTHROSCOPY Left 2013   LAPAROSCOPIC APPENDECTOMY N/A 12/08/2018   Procedure: APPENDECTOMY LAPAROSCOPIC;  Surgeon: Donnie Mesa, MD;  Location: [facility];  Service: General;  Laterality: N/A;   TENNIS ELBOW RELEASE/NIRSCHEL PROCEDURE  2009   WISDOM TOOTH EXTRACTION      There were no vitals filed for this visit.   Subjective Assessment -  03/02/21 1454     Subjective I have missed therapy due to bronchitis, having an ablation, migraine and back pain. I have not had a chance to do any of the exercise. I am using the vaginal inserts for estrogen. I tried the sample but was not as helpful.    Patient Stated Goals reduce constipation, work on vaginal dryness    Currently in Pain? No/denies    Multiple Pain Sites No                               [facility] Adult PT Treatment/Exercise - 03/02/21 0001       Self-Care   Self-Care Other Self-Care Comments    Other Self-Care Comments  discussed drinking water to reduce vaginal dryness;educated patient on vaginal health, ways to moisturize the vulvar area; education on vaginal lubricants and which ones are appropriate and why; education on abdominal massage and gave her the link to the You tube channel      Manual Therapy   Manual Therapy Myofascial release;Soft tissue mobilization    Soft tissue mobilization circular abdominal massage to promote peristalic motion of the intestines to reduce constipation; manual work around the c-secion  scar to improve mobility    Myofascial Release fascial release around the umbilicus, lower abdomen to improve tissue mobility                     PT Education - 03/02/21 1527     Education Details gave patient link for abdominal massage on youtube; education on vaginal moisturizers and lubricants; education on c-section scar    Person(s) Educated Patient    Methods Explanation;Demonstration;Verbal cues;Handout    Comprehension Returned demonstration;Verbalized understanding              PT Short Term Goals - 12/02/20 1449       PT SHORT TERM GOAL #1   Title Independent with initial HEP    Time 4    Period Months    Status New    Target Date 12/30/20      PT SHORT TERM GOAL #2   Title educated on vaginal health with moisturizers and lubricants    Time 4    Period Weeks    Status New    Target Date  12/30/20               PT Long Term Goals - 12/02/20 1545       PT LONG TERM GOAL #1   Title independent with advanced HEP for pelvic floor and core    Time 4    Period Months    Status New    Target Date 04/03/21      PT LONG TERM GOAL #2   Title understand how to work out without increasing abdominal pressure to reduce the strain on the pelvic floor    Time 4    Period Months    Status New    Target Date 04/03/21      PT LONG TERM GOAL #3   Title able to have penile penetration vaginally with pain level 0-1/10 due to improved tissue mobility and reduction of trigger points    Time 4    Period Months    Status New    Target Date 04/03/21      PT LONG TERM GOAL #4   Title understand correct toileting technique with patient understanding how to bulge the pelvic floor to relax the anus    Time 4    Period Months    Status New    Target Date 04/03/21                   Plan - 03/02/21 1530     Clinical Impression Statement Patient has not been in therapy for 3 months due to her having bronchitis, back ablation, and migraine. Patient has not been able to do her HEP program due to all the issues she had the past 3 months. Patient was educated on abdominal massage, vaginal lubricants and moisturizers to help with consitpation with vaginal dryness. Patient has fascial tightness in the umbilicus, and lower abdomen.    Personal Factors and Comorbidities Comorbidity 2;Fitness;Sex    Comorbidities appendectomy; C-section 1994    Examination-Activity Limitations Toileting    Examination-Participation Restrictions Interpersonal Relationship    Stability/Clinical Decision Making Stable/Uncomplicated    Rehab Potential Excellent    PT Frequency 1x / week   every other week   PT Duration Other (comment)   4 months   PT Treatment/Interventions ADLs/Self Care Home Management;Biofeedback;Therapeutic activities;Therapeutic exercise;Neuromuscular re-education;Patient/family  education;Manual techniques;Scar mobilization;Dry needling    PT Next Visit Plan abdominal contraction, toileting, abdominal massage; manual  work to the pelvic floor to reduce trigger points    PT Home Exercise Plan Access Code: [email or web address]    Recommended Other Services MD signed initial note    Consulted and Agree with Plan of Care Patient             Patient will benefit from skilled therapeutic intervention in order to improve the following deficits and impairments:  Decreased coordination, Increased fascial restricitons, Decreased activity tolerance, Decreased strength, Decreased scar mobility  Visit Diagnosis: Muscle weakness (generalized)  Other lack of coordination  Abnormal posture     Problem List Patient Active Problem List   Diagnosis Date Noted   Lumbar radiculopathy, right 10/08/2020   SI (sacroiliac) joint dysfunction 09/16/2020   Somatic dysfunction of spine, sacral 09/16/2020   OSA (obstructive sleep apnea) 12/18/2019   FUO (fever of unknown origin) 10/21/2019   S/P bilateral breast reduction 06/19/2019   Back pain 04/15/2019   Neck pain 04/15/2019   History of frequent upper respiratory infection 01/16/2019   Atelectasis 01/16/2019   Severe persistent asthma 01/16/2019   Shortness of breath 01/01/2019   Primary osteoarthritis of both knees 07/10/2018   Hypertension 03/27/2018   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

## 2021-03-02 NOTE — Patient Instructions (Addendum)
Moisturizers They are used in the vagina to hydrate the mucous membrane that make up the vaginal canal. Designed to keep a more normal acid balance (ph) Once placed in the vagina, it will last between two to three days.  Use 2-3 times per week at bedtime  Ingredients to avoid is glycerin and fragrance, can increase chance of infection Should not be used just before sex due to causing irritation Most are gels administered either in a tampon-shaped applicator or as a vaginal suppository. They are non-hormonal.    Creams to use externally on the Vulva area Julva-amazon Vital "V [place] salve ( help moisturize and help with thinning vulvar area, does have [place] by Irwin Brakeman labial moisturizer (Amazon,  Coconut or olive oil Aloe Yes   Things to avoid in the vaginal area Do not use things to irritate the vulvar area No lotions just specialized creams for the vulva area- Neogyn, V-magic, No soaps; can use Aveeno or Calendula cleanser if needed. Must be gentle No deodorants No douches Good to sleep without underwear to let the vaginal area to air out on the days you are not using estrogen.  No scrubbing: spread the lips to let warm water rinse over labias and pat dry  Try Evening Primrose Oil capsules.    Lubrication Used for intercourse to reduce friction Avoid ones that have glycerin, warming gels, tingling gels, icing or cooling gel, scented Avoid parabens due to a preservative similar to female sex hormone May need to be reapplied once or several times during sexual activity Can be applied to both partners genitals prior to vaginal penetration to minimize friction or irritation Prevent irritation and mucosal tears that cause post coital pain and increased the risk of vaginal and urinary tract infections Oil-based lubricants cannot be used with condoms due to breaking them down.  Least likely to irritate  vaginal tissue.  Plant based-lubes are safe Silicone-based lubrication are thicker and last long and used for post-menopausal women  Vaginal Lubricators Here is a list of some suggested lubricators you can use for intercourse. Use the most hypoallergenic product.  You can place on you or your partner.  Slippery Stuff ( water based) Sylk or Sliquid Natural H2O ( good  if frequent UTI's)- walmart, amazon Sliquid organics silk-(aloe and silicone based ) [employer] (www.blossom-organics.com)- (aloe based ) Coconut oil, olive oil -not good with condoms  PJur Woman Nude- (water based) amazon Uberlube- ( silicon) [place] has an organic one Yes lubricant- (water based and has plant oil based similar to silicone) [employer] Platinum-Silicone, Target, [facility] Olive and Bee intimate cream-  www.oliveandbee.com.au Pink - [employer] stuff Erosense Sync- walmart, amazon Coconu- FailLinks.co.uk  Things to avoid in lubricants are glycerin, warming gels, tingling gels, icing or cooling  gels, and scented gels.  Also avoid Vaseline. KY jelly, Replens, and Astroglide contain chlorhexidine which kills good bacteria(lactobacilli)    [facility] [address] [place], [place] [address] Phone # [phone] Fax [phone]

## 2021-03-07 ENCOUNTER — Other Ambulatory Visit: Payer: Self-pay | Admitting: Allergy

## 2021-03-09 ENCOUNTER — Encounter: Payer: Self-pay | Admitting: Physical Therapy

## 2021-03-09 ENCOUNTER — Other Ambulatory Visit: Payer: Self-pay

## 2021-03-09 ENCOUNTER — Ambulatory Visit: Payer: 59 | Admitting: Physical Therapy

## 2021-03-09 DIAGNOSIS — R278 Other lack of coordination: Secondary | ICD-10-CM

## 2021-03-09 DIAGNOSIS — M6281 Muscle weakness (generalized): Secondary | ICD-10-CM | POA: Diagnosis not present

## 2021-03-09 NOTE — Therapy (Signed)
[facility] @ [place], [place], [address] Phone: [phone]   Fax:  [phone]  Physical Therapy Treatment  Patient Details  Name: Alyssa Clay MRN: [id number] Date of Birth: Apr 11, 1966 Referring Provider (PT): Dr. Thornton Park   Encounter Date: 03/09/2021   PT End of Session - 03/09/21 1609     Visit Number 3    Date for PT Re-Evaluation 04/03/21    Authorization Type UHC    Authorization - Visit Number 3    Authorization - Number of Visits 25    PT Start Time 2992    PT Stop Time 1610    PT Time Calculation (min) 40 min    Activity Tolerance Patient tolerated treatment well    Behavior During Therapy [facility] for tasks assessed/performed             Past Medical History:  Diagnosis Date   Allergy    Anemia    Asthma    Constipation    issues x the past month -? 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   Enlarged ovary 09/15/2020   noted on ct scan, per pt   FUO (fever of unknown origin) 10/21/2019   GERD (gastroesophageal reflux disease)    Hypertension    PAST hx    Migraine     Past Surgical History:  Procedure Laterality Date   APPENDECTOMY     BREAST REDUCTION SURGERY Bilateral 06/12/2019   Procedure: BREAST REDUCTION WITH LIPOSUCTION;  Surgeon: Wallace Going, DO;  Location: [facility];  Service: Plastics;  Laterality: Bilateral;  4 hours, please   CESAREAN SECTION  1994   DILATION AND CURETTAGE OF UTERUS     multiple due to miscarriages   KNEE ARTHROSCOPY Left 2013   LAPAROSCOPIC APPENDECTOMY N/A 12/08/2018   Procedure: APPENDECTOMY LAPAROSCOPIC;  Surgeon: Donnie Mesa, MD;  Location: [facility];  Service: General;  Laterality: N/A;   TENNIS ELBOW RELEASE/NIRSCHEL PROCEDURE  2009   WISDOM TOOTH EXTRACTION      There were no vitals filed for this visit.   Subjective Assessment -  03/09/21 1534     Subjective I have been using the samples. Inside of the vagina hurts even with fingers with intimacy and when she places her finger in the vaginal canal.    Patient Stated Goals reduce constipation, work on vaginal dryness    Currently in Pain? Yes                            Pelvic Floor Special Questions - 03/09/21 0001     Pelvic Floor Internal Exam Patient confirms identification and approves PT to assess pelvic floor and treatment    Exam Type Vaginal               [facility] Adult PT Treatment/Exercise - 03/09/21 0001       Self-Care   Self-Care Other Self-Care Comments    Other Self-Care Comments  educated patient about the pelvis and how the estradial helps her tissue and the aloe      Manual Therapy   Manual Therapy Soft tissue mobilization    Manual therapy comments educated patient on how to perfrom manual work to the perineum, along the pubic rami and hip adductors to lengthen the tissue    Soft  tissue mobilization manual work to bilateral hip addutors, quadricep, and hamstring; manual work along the pubic rami and urogenital diaphragm and monitored for pain and went slowly                     PT Education - 03/09/21 1608     Education Details educated patient on how to perfrom manual work to the hip adductors, along the pubic rami, and along the urogenital diaphragm    Person(s) Educated Patient    Methods Explanation;Demonstration    Comprehension Verbalized understanding;Returned demonstration              PT Short Term Goals - 03/09/21 1613       PT SHORT TERM GOAL #2   Title educated on vaginal health with moisturizers and lubricants    Time 4    Period Weeks    Status Achieved               PT Long Term Goals - 12/02/20 1545       PT LONG TERM GOAL #1   Title independent with advanced HEP for pelvic floor and core    Time 4    Period Months    Status New    Target Date 04/03/21      PT  LONG TERM GOAL #2   Title understand how to work out without increasing abdominal pressure to reduce the strain on the pelvic floor    Time 4    Period Months    Status New    Target Date 04/03/21      PT LONG TERM GOAL #3   Title able to have penile penetration vaginally with pain level 0-1/10 due to improved tissue mobility and reduction of trigger points    Time 4    Period Months    Status New    Target Date 04/03/21      PT LONG TERM GOAL #4   Title understand correct toileting technique with patient understanding how to bulge the pelvic floor to relax the anus    Time 4    Period Months    Status New    Target Date 04/03/21                   Plan - 03/09/21 1609     Clinical Impression Statement Patient is concerned with the pelvic pain. The therapist explained the anatomy of the pelvic floor, the muscles that have trigger points, anaotmy of the vaginal canal and how the tissue changes with menopause; Patient understands how to perform manual work to the hip adductors, along the pubic rami and urogenital diaphragm which has reduced some of her pain. Patient is using her vaginal moisturizers and insert of estradiol to assit with the tissue health. Patient will benefit from skilled therapy to work on tissue lengtening, reducing trigger points to reduce pain, and improve pelvic floor coordination.    Personal Factors and Comorbidities Comorbidity 2;Fitness;Sex    Comorbidities appendectomy; C-section 1994    Examination-Activity Limitations Toileting    Examination-Participation Restrictions Interpersonal Relationship    Stability/Clinical Decision Making Stable/Uncomplicated    Rehab Potential Excellent    PT Frequency 1x / week   every other week   PT Duration Other (comment)   4 months   PT Treatment/Interventions ADLs/Self Care Home Management;Biofeedback;Therapeutic activities;Therapeutic exercise;Neuromuscular re-education;Patient/family education;Manual  techniques;Scar mobilization;Dry needling    PT Next Visit Plan abdominal contraction, toileting, ; manual work to the pelvic floor  to reduce trigger points; diaphragmatic breathing to bulge the pelvic floor as working on trigger points    PT Home Exercise Plan Access Code: [email or web address]    Consulted and Agree with Plan of Care Patient             Patient will benefit from skilled therapeutic intervention in order to improve the following deficits and impairments:  Decreased coordination, Increased fascial restricitons, Decreased activity tolerance, Decreased strength, Decreased scar mobility  Visit Diagnosis: Muscle weakness (generalized)  Other lack of coordination     Problem List Patient Active Problem List   Diagnosis Date Noted   Lumbar radiculopathy, right 10/08/2020   SI (sacroiliac) joint dysfunction 09/16/2020   Somatic dysfunction of spine, sacral 09/16/2020   OSA (obstructive sleep apnea) 12/18/2019   FUO (fever of unknown origin) 10/21/2019   S/P bilateral breast reduction 06/19/2019   Back pain 04/15/2019   Neck pain 04/15/2019   History of frequent upper respiratory infection 01/16/2019   Atelectasis 01/16/2019   Severe persistent asthma 01/16/2019   Shortness of breath 01/01/2019   Primary osteoarthritis of both knees 07/10/2018   Hypertension 03/27/2018   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

## 2021-03-11 ENCOUNTER — Other Ambulatory Visit: Payer: Self-pay | Admitting: Allergy

## 2021-03-16 ENCOUNTER — Other Ambulatory Visit: Payer: Self-pay | Admitting: Allergy

## 2021-03-16 ENCOUNTER — Encounter: Payer: Self-pay | Admitting: Physical Therapy

## 2021-03-16 ENCOUNTER — Other Ambulatory Visit: Payer: Self-pay

## 2021-03-16 ENCOUNTER — Ambulatory Visit: Payer: 59 | Admitting: Physical Therapy

## 2021-03-16 DIAGNOSIS — R278 Other lack of coordination: Secondary | ICD-10-CM

## 2021-03-16 DIAGNOSIS — M6281 Muscle weakness (generalized): Secondary | ICD-10-CM

## 2021-03-16 NOTE — Therapy (Signed)
[facility] @ [address] [place], [place], [address] Phone: [phone]   Fax:  [phone]  Physical Therapy Treatment  Patient Details  Name: Alyssa Clay MRN: [id number] Date of Birth: 1965-08-29 Referring Provider (PT): Dr. Thornton Park   Encounter Date: 03/16/2021   PT End of Session - 03/16/21 0901     Visit Number 4    Date for PT Re-Evaluation 04/03/21    Authorization Type [employer]    Authorization - Visit Number 4    Authorization - Number of Visits 25    PT Start Time 0845    PT Stop Time 0923    PT Time Calculation (min) 38 min    Activity Tolerance Patient tolerated treatment well;No increased pain    Behavior During Therapy WFL for tasks assessed/performed             Past Medical History:  Diagnosis Date   Allergy    Anemia    Asthma    Constipation    issues x the past month -? 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   Enlarged ovary 09/15/2020   noted on ct scan, per pt   FUO (fever of unknown origin) 10/21/2019   GERD (gastroesophageal reflux disease)    Hypertension    PAST hx    Migraine     Past Surgical History:  Procedure Laterality Date   APPENDECTOMY     BREAST REDUCTION SURGERY Bilateral 06/12/2019   Procedure: BREAST REDUCTION WITH LIPOSUCTION;  Surgeon: Wallace Going, DO;  Location: [facility];  Service: Plastics;  Laterality: Bilateral;  4 hours, please   CESAREAN SECTION  1994   DILATION AND CURETTAGE OF UTERUS     multiple due to miscarriages   KNEE ARTHROSCOPY Left 2013   LAPAROSCOPIC APPENDECTOMY N/A 12/08/2018   Procedure: APPENDECTOMY LAPAROSCOPIC;  Surgeon: Donnie Mesa, MD;  Location: [facility];  Service: General;  Laterality: N/A;   TENNIS ELBOW RELEASE/NIRSCHEL PROCEDURE  2009   WISDOM TOOTH EXTRACTION      There were no vitals filed for this visit.    Subjective Assessment - 03/16/21 0852     Subjective I am using the Aloe. I have been doing some stretches but limited due to my back pain.    Patient Stated Goals reduce constipation, work on vaginal dryness    Currently in Pain? Yes    Pain Score 4    worse is 8/10   Pain Location Back    Pain Orientation Lower    Pain Descriptors / Indicators Discomfort    Pain Type Chronic pain    Pain Onset More than a month ago    Pain Frequency Intermittent    Aggravating Factors  bending forward    Pain Relieving Factors sitting upright    Multiple Pain Sites No                            Pelvic Floor Special Questions - 03/16/21 0001     Pelvic Floor Internal Exam Patient confirms identification and approves PT to assess pelvic floor and treatment    Exam Type Vaginal    Strength good squeeze, good lift, able to hold agaisnt strong resistance               [facility] Adult PT  Treatment/Exercise - 03/16/21 0001       Therapeutic Activites    Therapeutic Activities Other Therapeutic Activities    Other Therapeutic Activities educated patient on how to toilet correctly with breath, knees above hips, and relaxing the face and feeet.      Modalities   Modalities Moist Heat      Moist Heat Therapy   Number Minutes Moist Heat --   during treatment to reduce back pain   Moist Heat Location Lumbar Spine      Manual Therapy   Manual Therapy Internal Pelvic Floor    Internal Pelvic Floor manual work to bilateral levator ani, obturator internits, superior transverse perineum, urognital diaphgram to elongate the tissue                     PT Education - 03/16/21 0926     Education Details toileting technique    Person(s) Educated Patient    Methods Explanation;Demonstration;Handout    Comprehension Returned demonstration;Verbalized understanding              PT Short Term Goals - 03/09/21 1613       PT SHORT TERM GOAL #2   Title educated on  vaginal health with moisturizers and lubricants    Time 4    Period Weeks    Status Achieved               PT Long Term Goals - 03/16/21 0932       PT LONG TERM GOAL #1   Title independent with advanced HEP for pelvic floor and core    Time 4    Period Months    Status On-going      PT LONG TERM GOAL #2   Title understand how to work out without increasing abdominal pressure to reduce the strain on the pelvic floor    Time 4    Period Months    Status On-going      PT LONG TERM GOAL #3   Title able to have penile penetration vaginally with pain level 0-1/10 due to improved tissue mobility and reduction of trigger points    Time 4    Period Months    Status On-going      PT LONG TERM GOAL #4   Title understand correct toileting technique with patient understanding how to bulge the pelvic floor to relax the anus    Time 4    Period Months    Status Achieved                   Plan - 03/16/21 0927     Clinical Impression Statement Patient is able to toilet with greater ease. She has increased in pelvic floor strength to 4/5. Patient had tightness in the pelvic floor instead of trigger points. Patient understands correct toileting techinques and was able to demonstrate without aggravating her back. She is having a flare-up with her back pain. Patient is using aloe to work on her vaginal dryness. the tissue is looking healthier. Patient will benefit from skilled therapy to work on tissue lengthening, reducing pain, and improve pelvic floor coordination.    Personal Factors and Comorbidities Comorbidity 2;Fitness;Sex    Comorbidities appendectomy; C-section 1994    Examination-Activity Limitations Toileting    Examination-Participation Restrictions Interpersonal Relationship    Stability/Clinical Decision Making Stable/Uncomplicated    Rehab Potential Excellent    PT Frequency 1x / week   every other week   PT Duration  Other (comment)   4 months   PT  Treatment/Interventions ADLs/Self Care Home Management;Biofeedback;Therapeutic activities;Therapeutic exercise;Neuromuscular re-education;Patient/family education;Manual techniques;Scar mobilization;Dry needling    PT Next Visit Plan abdominal contraction, toileting, ;diaphragmatic breathing to bulge the pelvic floor as working on trigger points    PT Home Exercise Plan Access Code: [email or web address]    Consulted and Agree with Plan of Care Patient             Patient will benefit from skilled therapeutic intervention in order to improve the following deficits and impairments:  Decreased coordination, Increased fascial restricitons, Decreased activity tolerance, Decreased strength, Decreased scar mobility  Visit Diagnosis: Muscle weakness (generalized)  Other lack of coordination     Problem List Patient Active Problem List   Diagnosis Date Noted   Lumbar radiculopathy, right 10/08/2020   SI (sacroiliac) joint dysfunction 09/16/2020   Somatic dysfunction of spine, sacral 09/16/2020   OSA (obstructive sleep apnea) 12/18/2019   FUO (fever of unknown origin) 10/21/2019   S/P bilateral breast reduction 06/19/2019   Back pain 04/15/2019   Neck pain 04/15/2019   History of frequent upper respiratory infection 01/16/2019   Atelectasis 01/16/2019   Severe persistent asthma 01/16/2019   Shortness of breath 01/01/2019   Primary osteoarthritis of both knees 07/10/2018   Hypertension 03/27/2018   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

## 2021-03-16 NOTE — Patient Instructions (Signed)

## 2021-03-21 ENCOUNTER — Ambulatory Visit: Payer: 59 | Admitting: Physical Therapy

## 2021-03-21 ENCOUNTER — Encounter: Payer: Self-pay | Admitting: Physical Therapy

## 2021-03-21 ENCOUNTER — Other Ambulatory Visit: Payer: Self-pay

## 2021-03-21 DIAGNOSIS — M6281 Muscle weakness (generalized): Secondary | ICD-10-CM

## 2021-03-21 DIAGNOSIS — R278 Other lack of coordination: Secondary | ICD-10-CM

## 2021-03-21 NOTE — Therapy (Signed)
[facility] @ [address] [place], [place], [address] Phone: [phone]   Fax:  [phone]  Physical Therapy Treatment  Patient Details  Name: Alyssa Clay MRN: [id number] Date of Birth: 03-18-1966 Referring Provider (PT): Dr. Thornton Park   Encounter Date: 03/21/2021   PT End of Session - 03/21/21 1524     Visit Number 5    Date for PT Re-Evaluation 04/03/21    Authorization Type U[employer]    Authorization - Visit Number 5    Authorization - Number of Visits 25    PT Start Time 5732    PT Stop Time 1525    PT Time Calculation (min) 40 min    Activity Tolerance Patient tolerated treatment well;No increased pain    Behavior During Therapy WFL for tasks assessed/performed             Past Medical History:  Diagnosis Date   Allergy    Anemia    Asthma    Constipation    issues x the past month -? 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   Enlarged ovary 09/15/2020   noted on ct scan, per pt   FUO (fever of unknown origin) 10/21/2019   GERD (gastroesophageal reflux disease)    Hypertension    PAST hx    Migraine     Past Surgical History:  Procedure Laterality Date   APPENDECTOMY     BREAST REDUCTION SURGERY Bilateral 06/12/2019   Procedure: BREAST REDUCTION WITH LIPOSUCTION;  Surgeon: Wallace Going, DO;  Location: [facility];  Service: Plastics;  Laterality: Bilateral;  4 hours, please   CESAREAN SECTION  1994   DILATION AND CURETTAGE OF UTERUS     multiple due to miscarriages   KNEE ARTHROSCOPY Left 2013   LAPAROSCOPIC APPENDECTOMY N/A 12/08/2018   Procedure: APPENDECTOMY LAPAROSCOPIC;  Surgeon: Donnie Mesa, MD;  Location: [facility];  Service: General;  Laterality: N/A;   TENNIS ELBOW RELEASE/NIRSCHEL PROCEDURE  2009   WISDOM TOOTH EXTRACTION      There were no vitals filed for this visit.    Subjective Assessment - 03/21/21 1452     Subjective I continue to have back pain. My pelvic floor feels loser. The constipation is better. She goes to the bathroom 1-2 times per day and feels like a normal stool. Patient has been eating lots of salads.    Patient Stated Goals reduce constipation, work on vaginal dryness    Currently in Pain? Yes    Pain Score 8     Pain Location Back    Pain Orientation Lower    Pain Descriptors / Indicators Discomfort    Pain Type Chronic pain    Pain Onset More than a month ago    Pain Frequency Intermittent    Aggravating Factors  bending forward    Pain Relieving Factors sitting upright    Multiple Pain Sites No                               [facility] Adult PT Treatment/Exercise - 03/21/21 0001       Manual Therapy   Manual Therapy Soft tissue mobilization    Manual therapy comments duringmanual work the patient had moist heat on her back due to her back pain  Soft tissue mobilization manual work to bilateral hip adductors, quadricep and hamstring    Internal Pelvic Floor manual work to the posterior vaginal canal, along the perineal body, manual work on bilateral levator ani and Transport planner with hip movement                       PT Short Term Goals - 03/21/21 1529       PT SHORT TERM GOAL #1   Title Independent with initial HEP    Time 4    Period Months    Status Achieved      PT SHORT TERM GOAL #2   Title educated on vaginal health with moisturizers and lubricants    Time 4    Status Achieved               PT Long Term Goals - 03/16/21 0932       PT LONG TERM GOAL #1   Title independent with advanced HEP for pelvic floor and core    Time 4    Period Months    Status On-going      PT LONG TERM GOAL #2   Title understand how to work out without increasing abdominal pressure to reduce the strain on the pelvic floor    Time 4    Period Months    Status On-going      PT LONG  TERM GOAL #3   Title able to have penile penetration vaginally with pain level 0-1/10 due to improved tissue mobility and reduction of trigger points    Time 4    Period Months    Status On-going      PT LONG TERM GOAL #4   Title understand correct toileting technique with patient understanding how to bulge the pelvic floor to relax the anus    Time 4    Period Months    Status Achieved                   Plan - 03/21/21 1524     Clinical Impression Statement Patient is able to have 2 bowel movements per day with good form. Patient completely emptys her stool and bladder. Patient has not had penile penetration yet to see if there is less pain. Patient is using the creams to reduce her vaginal dryness. Patient continues to have back pain and after manual work the pain reduced to 3/10. Patient has more tightness on the right side of the pelvic floor compared to the left. Patient will benefit from skilled therapy to work on tissue lengthening, reduce her pain and improve pelvic floor coordination.    Personal Factors and Comorbidities Comorbidity 2;Fitness;Sex    Comorbidities appendectomy; C-section 1994    Examination-Activity Limitations Toileting    Examination-Participation Restrictions Interpersonal Relationship    Stability/Clinical Decision Making Stable/Uncomplicated    Rehab Potential Excellent    PT Frequency 1x / week   every other week   PT Duration Other (comment)   4 months   PT Treatment/Interventions ADLs/Self Care Home Management;Biofeedback;Therapeutic activities;Therapeutic exercise;Neuromuscular re-education;Patient/family education;Manual techniques;Scar mobilization;Dry needling    PT Next Visit Plan abdominal contraction, manual work to right pelvic floor with hip movements; see if she wants dry needling to hip adductors    PT Home Exercise Plan Access Code: [email or web address]    Consulted and Agree with Plan of Care Patient             Patient will benefit from  skilled therapeutic intervention in order to improve the following deficits and impairments:  Decreased coordination, Increased fascial restricitons, Decreased activity tolerance, Decreased strength, Decreased scar mobility  Visit Diagnosis: Muscle weakness (generalized)  Other lack of coordination     Problem List Patient Active Problem List   Diagnosis Date Noted   Lumbar radiculopathy, right 10/08/2020   SI (sacroiliac) joint dysfunction 09/16/2020   Somatic dysfunction of spine, sacral 09/16/2020   OSA (obstructive sleep apnea) 12/18/2019   FUO (fever of unknown origin) 10/21/2019   S/P bilateral breast reduction 06/19/2019   Back pain 04/15/2019   Neck pain 04/15/2019   History of frequent upper respiratory infection 01/16/2019   Atelectasis 01/16/2019   Severe persistent asthma 01/16/2019   Shortness of breath 01/01/2019   Primary osteoarthritis of both knees 07/10/2018   Hypertension 03/27/2018   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

## 2021-03-22 ENCOUNTER — Telehealth: Payer: Self-pay | Admitting: Family Medicine

## 2021-03-22 ENCOUNTER — Ambulatory Visit: Payer: 59

## 2021-03-22 NOTE — Telephone Encounter (Signed)
Patient is going to be traveling to [name] for her aunt's funeral. She asked if Dr Tamala Julian would be able to fill out a handicap placard for her to use when she is there due to her back pain and the cold weather..  Please advise.

## 2021-03-22 NOTE — Telephone Encounter (Signed)
Spoke with patient. Will not be issuing handicap placard

## 2021-03-23 ENCOUNTER — Encounter: Payer: 59 | Admitting: Physical Therapy

## 2021-03-29 ENCOUNTER — Ambulatory Visit: Payer: 59 | Admitting: Family Medicine

## 2021-03-30 ENCOUNTER — Ambulatory Visit: Payer: 59

## 2021-03-31 ENCOUNTER — Ambulatory Visit (INDEPENDENT_AMBULATORY_CARE_PROVIDER_SITE_OTHER): Payer: 59

## 2021-03-31 ENCOUNTER — Ambulatory Visit: Payer: 59

## 2021-03-31 ENCOUNTER — Other Ambulatory Visit: Payer: Self-pay | Admitting: Family Medicine

## 2021-03-31 ENCOUNTER — Other Ambulatory Visit: Payer: Self-pay

## 2021-03-31 DIAGNOSIS — J4550 Severe persistent asthma, uncomplicated: Secondary | ICD-10-CM | POA: Diagnosis not present

## 2021-03-31 MED ORDER — TEZEPELUMAB-EKKO 210 MG/1.91ML ~~LOC~~ SOSY
210.0000 mg | PREFILLED_SYRINGE | SUBCUTANEOUS | Status: AC
  Administered 2021-03-31 – 2021-08-19 (×5): 210 mg via SUBCUTANEOUS

## 2021-03-31 NOTE — Progress Notes (Signed)
[facility] [place] Phone: [phone] Subjective:   [name], am serving as a scribe for Dr. Hulan Saas.  This visit occurred during the SARS-CoV-2 public health emergency.  Safety protocols were in place, including screening questions prior to the visit, additional usage of staff PPE, and extensive cleaning of exam room while observing appropriate contact time as indicated for disinfecting solutions.   I'm seeing this patient by the request  of:  Caren Macadam, MD  CC: right back pain   [id number]  03/01/2021 Patient likely is not having as much radicular symptoms but continues to have pain especially with extension of the back secondary to probably more of the facet arthropathy.  Patient did not respond as well to the radiofrequency ablation but has been less than a month that it could potentially make some improvement still.  At the same time I would like to refer the patient to see if patient is a candidate for potential spinal stimulator but otherwise we will continue same medications.  Did do a sacroiliac joint injection today and we will see how patient responds.  Follow-up with me again in 6 to 8 weeks  Patient has more of the facet arthropathy.  It did respond where patient did have 100% relief initially after the radiofrequency ablation but unfortunately the pain is already coming back after a month.  We discussed with patient it could continue to get better but we will see.  Attempted a sacroiliac injection to see if this will be beneficial.  Will refer patient to see if she is a candidate for a spinal stimulator.  With patient's MRI findings I do not think she is a surgical candidate at the moment.  Refill of the meloxicam for this chronic problem with exacerbation and follow-up with me again in 6 to 8 weeks  Update 04/05/2021 Alyssa Clay is a 55 y.o. female coming in with complaint of SI joint  pain. Injected R side last visit.  Patient was having more back pain and did have radiofrequency ablation that initially did not count 100% of the pain but started having increasing again.  Attempted a right sacroiliac injection last exam.  Patient recently had to do some traveling secondary to the passing of her aunt.  Was also doing physical therapy before she had to travel.  Patient states that she traveled to [name] and since then she has had R sided glute pain. When she wakes up in morning her hip feels worse. Has been missing the neurosurgeon's call to get an appointment. Is frustrated as she has not been able to move forward with them. Had 2 days of relief from injection. Wants to know if she is okay to get back to the gym.        Past Medical History:  Diagnosis Date   Allergy    Anemia    Asthma    Constipation    issues x the past month -? 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   Enlarged ovary 09/15/2020   noted on ct scan, per pt   FUO (fever of unknown origin) 10/21/2019   GERD (gastroesophageal reflux disease)    Hypertension    PAST hx    Migraine    Past Surgical History:  Procedure Laterality Date   APPENDECTOMY     BREAST  REDUCTION SURGERY Bilateral 06/12/2019   Procedure: BREAST REDUCTION WITH LIPOSUCTION;  Surgeon: Wallace Going, DO;  Location: [facility];  Service: Plastics;  Laterality: Bilateral;  4 hours, please   CESAREAN SECTION  1994   DILATION AND CURETTAGE OF UTERUS     multiple due to miscarriages   KNEE ARTHROSCOPY Left 2013   LAPAROSCOPIC APPENDECTOMY N/A 12/08/2018   Procedure: APPENDECTOMY LAPAROSCOPIC;  Surgeon: Donnie Mesa, MD;  Location: [facility];  Service: General;  Laterality: N/A;   TENNIS ELBOW RELEASE/NIRSCHEL PROCEDURE  2009   WISDOM TOOTH EXTRACTION     Social History   Socioeconomic History   Marital status: Married    Spouse name: Not on  file   Number of children: Not on file   Years of education: Not on file   Highest education level: Not on file  Occupational History   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  Tobacco Use   Smoking status: Never   Smokeless tobacco: Never  Vaping Use   Vaping Use: Never used  Substance and Sexual Activity   Alcohol use: Yes    Comment: occ 2-3 beers a month    Drug use: Never   Sexual activity: Yes    Partners: Male  Other Topics Concern   Not on file  Social History Narrative   Not on file   Social Determinants of Health   Financial Resource Strain: Not on file  Food Insecurity: Not on file  Transportation Needs: Not on file  Physical Activity: Not on file  Stress: Not on file  Social Connections: Not on file   No Known Allergies Family History  Problem Relation Age of Onset   Depression Mother    Lymphoma Mother 67   Hypertension Father    Heart attack Father 68   Hypertension Brother    High blood pressure Brother    High Cholesterol Brother    Healthy Daughter    Colon cancer Maternal Aunt 21   Esophageal cancer Neg Hx    Pancreatic cancer Neg Hx    Stomach cancer Neg Hx    Colon polyps Neg Hx    Rectal cancer Neg Hx       Current Outpatient Medications (Cardiovascular):    rosuvastatin (CRESTOR) 5 MG tablet, Take 1 tablet (5 mg total) by mouth at bedtime.   Current Outpatient Medications (Respiratory):    albuterol (PROAIR HFA) 108 (90 Base) MCG/ACT inhaler, Inhale 2 puffs into the lungs every 4 (four) hours as needed for wheezing or shortness of breath.   albuterol (PROVENTIL) (2.5 MG/3ML) 0.083% nebulizer solution, Take 3 mLs (2.5 mg total) by nebulization every 4 (four) hours as needed for wheezing or shortness of breath (coughing fits).   budesonide (PULMICORT) 0.5 MG/2ML nebulizer solution, TAKE 2 MLS (0.5 MG TOTAL) BY NEBULIZATION IN THE MORNING AND AT BEDTIME. TAKE DURING UPPER RESPIRATORY INFECTIONS FOR 1-2 WEEKS AT A TIME.   budesonide-formoterol (SYMBICORT) 160-4.5  MCG/ACT inhaler, USE 2 INHALATIONS BY MOUTH  TWICE DAILY   FASENRA 30 MG/ML SOSY, SECOND SHIP: INJECT ONE SYRINGE UNDER THE SKIN AT WEEK 4 AND 8, THEN EVERY 8 WEEKS THEREAFTER. (Patient taking differently: Inject 30 mg into the skin See admin instructions. Every 8 weeks)   montelukast (SINGULAIR) 10 MG tablet, TAKE 1 TABLET BY MOUTH  DAILY  Current Facility-Administered Medications (Respiratory):    tezepelumab-ekko (TEZSPIRE) 210 MG/1.91ML syringe 210 mg  Current Outpatient Medications (Analgesics):    Atogepant (QULIPTA PO), Take by mouth.   meloxicam (MOBIC) 15  MG tablet, TAKE 1 TABLET (15 MG TOTAL) BY MOUTH DAILY.   meloxicam (MOBIC) 15 MG tablet, Take 1 tablet (15 mg total) by mouth daily.   rizatriptan (MAXALT) 10 MG tablet, TAKE 1 TABLET BY MOUTH ONCE AS NEEDED FOR MIGRAINE FOR UP TO 1 DOSE. MAY REPEAT IN 2 HOURS IF NEEDED     Current Outpatient Medications (Other):    hydrOXYzine (ATARAX) 10 MG tablet, Take 1 tablet (10 mg total) by mouth 3 (three) times daily as needed.   buPROPion (WELLBUTRIN XL) 150 MG 24 hr tablet, TAKE 1 TABLET BY MOUTH  DAILY   calcium carbonate (OSCAL) 1500 (600 Ca) MG TABS tablet, Take 600 mg of elemental calcium by mouth 2 (two) times daily with a meal.   cyclobenzaprine (FLEXERIL) 10 MG tablet, Take 10 mg by mouth 3 (three) times daily as needed (migraines).   DULoxetine (CYMBALTA) 60 MG capsule, TAKE 1 CAPSULE BY MOUTH  DAILY   Estradiol (YUVAFEM) 10 MCG TABS vaginal tablet, Place 1 tablet (10 mcg total) vaginally daily.   gabapentin (NEURONTIN) 300 MG capsule, Take 300-900 mg by mouth See admin instructions. Take 1 tablet every AM, three tablets at bedtime   Multiple Vitamin (MULTIVITAMIN WITH MINERALS) TABS tablet, Take 1 tablet by mouth daily.   nystatin cream (MYCOSTATIN), Apply 1 application topically 2 (two) times daily.   pantoprazole (PROTONIX) 40 MG tablet, TAKE 1 TABLET BY MOUTH 2 TIMES DAILY BEFORE A MEAL.   Respiratory Therapy Supplies  (FLUTTER) DEVI, Use 3 times daily as directed   tiZANidine (ZANAFLEX) 4 MG tablet, Take 4 mg by mouth every 6 (six) hours as needed for muscle spasms.   traZODone (DESYREL) 100 MG tablet, Take 1-2 tablets (100-200 mg total) by mouth at bedtime as needed for sleep.   valACYclovir (VALTREX) 1000 MG tablet, Take 1 tablet (1,000 mg total) by mouth 2 (two) times daily.  Current Facility-Administered Medications (Other):    0.9 %  sodium chloride infusion   Reviewed prior external information including notes and imaging from  primary care provider As well as notes that were available from care everywhere and other healthcare systems.  Past medical history, social, surgical and family history all reviewed in electronic medical record.  No pertanent information unless stated regarding to the chief complaint.   Review of Systems:  No headache, visual changes, nausea, vomiting, diarrhea, constipation, dizziness, abdominal pain, skin rash, fevers, chills, night sweats, weight loss, swollen lymph nodes, joint swelling, chest pain, shortness of breath, mood changes. POSITIVE muscle aches, body aches  Objective  Blood pressure 134/82, pulse (!) 103, height 5\' 5"  (1.651 m), weight 256 lb (116.1 kg), last menstrual period 12/20/2018, SpO2 95 %.   General: No apparent distress alert and oriented x3 mood and affect normal, dressed appropriately.  HEENT: Pupils equal, extraocular movements intact  Respiratory: Patient's speak in full sentences and does not appear short of breath  Cardiovascular: No lower extremity edema, non tender, no erythema  Gait normal with good balance and coordination.  MSK: Back exam shows the patient does have some tenderness to palpation diffusely.  Tightness noted with any type of worsening extension of the back.  Patient does have tightness with FABER test right greater than left.    Impression and Recommendations:     The above documentation has been reviewed and is  accurate and complete Lyndal Pulley, DO

## 2021-03-31 NOTE — Progress Notes (Signed)
Immunotherapy   Patient Details  Name: Alyssa Clay MRN: [id number] Date of Birth: [birth year]  03/31/2021  Beatrix Shipper here to start Tezspire for asthma. Patient received injection in right arm and waited 30 minutes with no problems. Frequency: every 4 weeks Consent signed and patient instructions given.   Herbie Drape 03/31/2021, 10:36 AM

## 2021-04-01 ENCOUNTER — Ambulatory Visit: Payer: 59 | Admitting: Physical Therapy

## 2021-04-04 ENCOUNTER — Other Ambulatory Visit: Payer: 59

## 2021-04-05 ENCOUNTER — Ambulatory Visit (INDEPENDENT_AMBULATORY_CARE_PROVIDER_SITE_OTHER): Payer: 59 | Admitting: Family Medicine

## 2021-04-05 ENCOUNTER — Other Ambulatory Visit: Payer: Self-pay

## 2021-04-05 ENCOUNTER — Encounter: Payer: Self-pay | Admitting: Family Medicine

## 2021-04-05 VITALS — BP 134/82 | HR 103 | Ht 65.0 in | Wt 256.0 lb

## 2021-04-05 DIAGNOSIS — M5416 Radiculopathy, lumbar region: Secondary | ICD-10-CM | POA: Diagnosis not present

## 2021-04-05 MED ORDER — HYDROXYZINE HCL 10 MG PO TABS: 10.0000 mg | ORAL_TABLET | Freq: Three times a day (TID) | ORAL | 0 refills | Status: DC | PRN

## 2021-04-05 MED ORDER — METHYLPREDNISOLONE ACETATE 80 MG/ML IJ SUSP
80.0000 mg | Freq: Once | INTRAMUSCULAR | Status: AC
Start: 2021-04-05 — End: 2021-04-05
  Administered 2021-04-05: 80 mg via INTRAMUSCULAR

## 2021-04-05 MED ORDER — KETOROLAC TROMETHAMINE 60 MG/2ML IM SOLN
60.0000 mg | Freq: Once | INTRAMUSCULAR | Status: AC
  Administered 2021-04-05: 60 mg via INTRAMUSCULAR

## 2021-04-05 NOTE — Assessment & Plan Note (Signed)
Continues in pain lower back.  Patient does have the MRI is showing more than facet arthropathy pain still seems to be out of proportion to the findings at the moment.  Did have the radiofrequency ablation and states that he was.  Sacroiliac injection helped for 2 days and then pain came back.  Patient does continue to have worsening pain with extension of the back and do feel that the facet arthropathy could be the main thing.  Discussed with patient seeing if she is a candidate for the spinal stimulator.  Okay for patient to become more active but avoiding the extension otherwise.  Continuing the Cymbalta, meloxicam as well as Zanaflex when needed.  Follow-up with me again in 6 to 8 weeks.

## 2021-04-05 NOTE — Patient Instructions (Addendum)
Put in another referral to [facility]: [phone] Injections in backside Hydroxiziine 10mg  3x a day as needed See me back again 6 weeks

## 2021-04-08 ENCOUNTER — Ambulatory Visit: Payer: 59 | Attending: Gastroenterology | Admitting: Physical Therapy

## 2021-04-08 ENCOUNTER — Encounter: Payer: Self-pay | Admitting: Physical Therapy

## 2021-04-08 ENCOUNTER — Other Ambulatory Visit: Payer: Self-pay

## 2021-04-08 DIAGNOSIS — R278 Other lack of coordination: Secondary | ICD-10-CM | POA: Insufficient documentation

## 2021-04-08 DIAGNOSIS — M6281 Muscle weakness (generalized): Secondary | ICD-10-CM | POA: Insufficient documentation

## 2021-04-08 NOTE — Therapy (Signed)
[facility] @ [address] [place], [place], [address] Phone: [phone]   Fax:  [phone]  Physical Therapy Treatment  Patient Details  Name: Alyssa Clay MRN: [id number] Date of Birth: 1965/05/23 Referring Provider (PT): Dr. Thornton Park   Encounter Date: 04/08/2021   PT End of Session - 04/08/21 1010     Visit Number 6    Date for PT Re-Evaluation 04/03/21    Authorization Type [employer]    Authorization - Visit Number 6    Authorization - Number of Visits 25    PT Start Time 0930    PT Stop Time 5573    PT Time Calculation (min) 38 min    Activity Tolerance Patient tolerated treatment well;No increased pain    Behavior During Therapy WFL for tasks assessed/performed             Past Medical History:  Diagnosis Date   Allergy    Anemia    Asthma    Constipation    issues x the past month -? 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   Enlarged ovary 09/15/2020   noted on ct scan, per pt   FUO (fever of unknown origin) 10/21/2019   GERD (gastroesophageal reflux disease)    Hypertension    PAST hx    Migraine     Past Surgical History:  Procedure Laterality Date   APPENDECTOMY     BREAST REDUCTION SURGERY Bilateral 06/12/2019   Procedure: BREAST REDUCTION WITH LIPOSUCTION;  Surgeon: Wallace Going, DO;  Location: [facility];  Service: Plastics;  Laterality: Bilateral;  4 hours, please   CESAREAN SECTION  1994   DILATION AND CURETTAGE OF UTERUS     multiple due to miscarriages   KNEE ARTHROSCOPY Left 2013   LAPAROSCOPIC APPENDECTOMY N/A 12/08/2018   Procedure: APPENDECTOMY LAPAROSCOPIC;  Surgeon: Donnie Mesa, MD;  Location: [facility];  Service: General;  Laterality: N/A;   TENNIS ELBOW RELEASE/NIRSCHEL PROCEDURE  2009   WISDOM TOOTH EXTRACTION      There were no vitals filed for this visit.    Subjective Assessment - 04/08/21 0931     Subjective I am doing all the home program. My back has been very bad do it is hard to do the exercises. I have an appoinment with neuro surgeon on tuesday. I am not having issue with constpation.    Patient Stated Goals reduce constipation, work on vaginal dryness    Currently in Pain? Yes    Pain Score 2     Pain Location Back    Pain Orientation Lower    Pain Descriptors / Indicators Discomfort    Pain Type Chronic pain    Pain Onset More than a month ago    Pain Frequency Intermittent    Aggravating Factors  bending forward    Pain Relieving Factors sitting upright    Multiple Pain Sites No                [facility] PT Assessment - 04/08/21 0001       Assessment   Medical Diagnosis K59.00 Constipation, unspecified type    Referring Provider (PT) Dr. Thornton Park    Onset Date/Surgical Date --   2 months ago   Prior Therapy none      Precautions   Precautions None  Restrictions   Weight Bearing Restrictions No      Home Ecologist residence      Prior Function   Level of Independence Independent    Leisure gym 5-6 times      Cognition   Overall Cognitive Status Within Functional Limits for tasks assessed      Strength   Overall Strength Comments bil. hip strength is 5/5; when contract the abdomen will bulge the abdomen                        Pelvic Floor Special Questions - 04/08/21 0001     Number of C-Sections 1    Currently Sexually Active Yes    Is this Painful Yes   initial and deep pentration   Urinary Leakage No    Fecal incontinence No    Pelvic Floor Internal Exam Patient confirms identification and approves PT to assess pelvic floor and treatment    Exam Type Vaginal    Strength good squeeze, good lift, able to hold agaisnt strong resistance               [facility] Adult PT Treatment/Exercise - 04/08/21 0001       Self-Care   Self-Care Other  Self-Care Comments    Other Self-Care Comments  gave patient information on places to give family members to deal with loved ones who have cancer; educated patient on massage gun so she is able to massage her muscles but not aggravate her back. educated patient on vaginal moisturizers to use daily on the days she is not using the estrogen cream to work with her dryness, Educated patient on vaginal massage wand to use on her muscles to reduce the tightness and pain during intercourse      Manual Therapy   Manual Therapy Soft tissue mobilization    Soft tissue mobilization using the addaday to the gluteal, lumbar, hip adductors and quadriceps to reduce muscle tension                     PT Education - 04/08/21 1009     Education Details educated patient on vaginal massage wand, massage gun for large muscles, and moisturizers    Person(s) Educated Patient    Methods Explanation    Comprehension Verbalized understanding              PT Short Term Goals - 04/08/21 0935       PT SHORT TERM GOAL #1   Title Independent with initial HEP    Time 4    Period Months    Status Achieved    Target Date 12/30/20      PT SHORT TERM GOAL #2   Title educated on vaginal health with moisturizers and lubricants    Time 4    Period Weeks    Status Achieved    Target Date 12/30/20               PT Long Term Goals - 04/08/21 0936       PT LONG TERM GOAL #1   Title independent with advanced HEP for pelvic floor and core    Time 4    Period Months    Status Achieved    Target Date 04/03/21      PT LONG TERM GOAL #2   Title understand how to work out without increasing abdominal pressure to reduce the strain on the pelvic floor  Time 4    Period Months    Status Achieved      PT LONG TERM GOAL #3   Title able to have penile penetration vaginally with pain level 0-1/10 due to improved tissue mobility and reduction of trigger points    Time 4    Period Months     Status Not Met      PT LONG TERM GOAL #4   Title understand correct toileting technique with patient understanding how to bulge the pelvic floor to relax the anus    Time 4    Period Months    Status Achieved    Target Date 04/03/21                   Plan - 04/08/21 1011     Clinical Impression Statement Patient is not having issues with constipation anymore. Patient is working on her vaginal dryness but has difficulty with performing the manual work due to her back. She is seeing a neurosurgeon for a consult. Patient has been very limited with back pain. Patient is dealing with grief due to family members passing away and her mothers new diagnosis of lung cancer. Therapist gave patient information for support. At this time patient is going to work on her vaginal dryness and pain with intercourse at home. She will be focusing on her back pain and mom.    Personal Factors and Comorbidities Comorbidity 2;Fitness;Sex    Comorbidities appendectomy; C-section 1994    Examination-Activity Limitations Toileting    Examination-Participation Restrictions Interpersonal Relationship    Stability/Clinical Decision Making Stable/Uncomplicated    Rehab Potential Excellent    PT Treatment/Interventions ADLs/Self Care Home Management;Biofeedback;Therapeutic activities;Therapeutic exercise;Neuromuscular re-education;Patient/family education;Manual techniques;Scar mobilization;Dry needling    PT Next Visit Plan Discharge to HEP    PT Home Exercise Plan Access Code: [email or web address]    Consulted and Agree with Plan of Care Patient             Patient will benefit from skilled therapeutic intervention in order to improve the following deficits and impairments:  Decreased coordination, Increased fascial restricitons, Decreased activity tolerance, Decreased strength, Decreased scar mobility  Visit Diagnosis: Muscle weakness (generalized)  Other lack of coordination     Problem List Patient  Active Problem List   Diagnosis Date Noted   Lumbar radiculopathy, right 10/08/2020   SI (sacroiliac) joint dysfunction 09/16/2020   Somatic dysfunction of spine, sacral 09/16/2020   OSA (obstructive sleep apnea) 12/18/2019   FUO (fever of unknown origin) 10/21/2019   S/P bilateral breast reduction 06/19/2019   Back pain 04/15/2019   Neck pain 04/15/2019   History of frequent upper respiratory infection 01/16/2019   Atelectasis 01/16/2019   Severe persistent asthma 01/16/2019   Shortness of breath 01/01/2019   Primary osteoarthritis of both knees 07/10/2018   Hypertension 03/27/2018   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    Remaining deficits: See above.    Education / Equipment: HEP   Patient agrees to discharge. Patient goals were partially met.  Patient is being discharged due to being pleased with the current functional level. Thank you for the referral. Earlie Counts, PT 04/08/21 10:16 AM

## 2021-04-11 ENCOUNTER — Ambulatory Visit: Payer: 59 | Admitting: Allergy

## 2021-04-13 ENCOUNTER — Encounter: Payer: Self-pay | Admitting: Family Medicine

## 2021-04-13 ENCOUNTER — Encounter: Payer: 59 | Admitting: Physical Therapy

## 2021-04-13 DIAGNOSIS — Z6841 Body Mass Index (BMI) 40.0 and over, adult: Secondary | ICD-10-CM

## 2021-04-13 DIAGNOSIS — R7301 Impaired fasting glucose: Secondary | ICD-10-CM

## 2021-04-13 DIAGNOSIS — G4733 Obstructive sleep apnea (adult) (pediatric): Secondary | ICD-10-CM

## 2021-04-15 MED ORDER — OZEMPIC (0.25 OR 0.5 MG/DOSE) 2 MG/1.5ML ~~LOC~~ SOPN: PEN_INJECTOR | SUBCUTANEOUS | 2 refills | Status: DC

## 2021-04-19 ENCOUNTER — Ambulatory Visit: Payer: 59 | Admitting: Family Medicine

## 2021-04-22 ENCOUNTER — Encounter: Payer: 59 | Admitting: Physical Therapy

## 2021-04-22 ENCOUNTER — Telehealth: Payer: 59 | Admitting: Physician Assistant

## 2021-04-22 DIAGNOSIS — B9689 Other specified bacterial agents as the cause of diseases classified elsewhere: Secondary | ICD-10-CM | POA: Diagnosis not present

## 2021-04-22 DIAGNOSIS — J019 Acute sinusitis, unspecified: Secondary | ICD-10-CM

## 2021-04-22 MED ORDER — DOXYCYCLINE HYCLATE 100 MG PO CAPS: 100.0000 mg | ORAL_CAPSULE | Freq: Two times a day (BID) | ORAL | 0 refills | Status: DC

## 2021-04-22 NOTE — Progress Notes (Signed)
E-Visit for Sinus Problems  We are sorry that you are not feeling well.  Here is how we plan to help!  I would recommend taking a home COVID test giving you noted exposure to someone with COVID, just to be cautious. Let us know if this is positive.   Otherwise:  Based on what you have shared with me it looks like you have sinusitis.  Sinusitis is inflammation and infection in the sinus cavities of the head.  Based on your presentation I believe you most likely have Acute Bacterial Sinusitis.  This is an infection caused by bacteria and is treated with antibiotics. I have prescribed Doxycycline 100mg  by mouth twice a day for 10 days. You may use an oral decongestant such as Mucinex D or if you have glaucoma or high blood pressure use plain Mucinex. Saline nasal spray help and can safely be used as often as needed for congestion.  If you develop worsening sinus pain, fever or notice severe headache and vision changes, or if symptoms are not better after completion of antibiotic, please schedule an appointment with a health care provider.    Sinus infections are not as easily transmitted as other respiratory infection, however we still recommend that you avoid close contact with loved ones, especially the very young and elderly.  Remember to wash your hands thoroughly throughout the day as this is the number one way to prevent the spread of infection!  Home Care: Only take medications as instructed by your medical team. Complete the entire course of an antibiotic. Do not take these medications with alcohol. A steam or ultrasonic humidifier can help congestion.  You can place a towel over your head and breathe in the steam from hot water coming from a faucet. Avoid close contacts especially the very young and the elderly. Cover your mouth when you cough or sneeze. Always remember to wash your hands.  Get Help Right Away If: You develop worsening fever or sinus pain. You develop a severe head ache  or visual changes. Your symptoms persist after you have completed your treatment plan.  Make sure you Understand these instructions. Will watch your condition. Will get help right away if you are not doing well or get worse.  Thank you for choosing an e-visit.  Your e-visit answers were reviewed by a board certified advanced clinical practitioner to complete your personal care plan. Depending upon the condition, your plan could have included both over the counter or prescription medications.  Please review your pharmacy choice. Make sure the pharmacy is open so you can pick up prescription now. If there is a problem, you may contact your provider through [employer] and have the prescription routed to another pharmacy.  Your safety is important to [place]. If you have drug allergies check your prescription carefully.   For the next 24 hours you can use MyChart to ask questions about today's visit, request a non-urgent call back, or ask for a work or school excuse. You will get an email in the next two days asking about your experience. I hope that your e-visit has been valuable and will speed your recovery.

## 2021-04-22 NOTE — Progress Notes (Signed)
I have spent 5 minutes in review of e-visit questionnaire, review and updating patient chart, medical decision making and response to patient.   Aviannah Castoro Cody Willis Kuipers, PA-C    

## 2021-04-26 ENCOUNTER — Encounter: Payer: Self-pay | Admitting: Family Medicine

## 2021-04-28 ENCOUNTER — Ambulatory Visit (INDEPENDENT_AMBULATORY_CARE_PROVIDER_SITE_OTHER): Payer: 59

## 2021-04-28 ENCOUNTER — Other Ambulatory Visit: Payer: Self-pay

## 2021-04-28 DIAGNOSIS — J4550 Severe persistent asthma, uncomplicated: Secondary | ICD-10-CM | POA: Diagnosis not present

## 2021-05-04 ENCOUNTER — Ambulatory Visit (INDEPENDENT_AMBULATORY_CARE_PROVIDER_SITE_OTHER): Payer: 59 | Admitting: Family Medicine

## 2021-05-04 ENCOUNTER — Telehealth: Payer: Self-pay | Admitting: Family Medicine

## 2021-05-04 VITALS — BP 154/80 | HR 105 | Temp 97.7°F | Wt 259.2 lb

## 2021-05-04 DIAGNOSIS — J320 Chronic maxillary sinusitis: Secondary | ICD-10-CM

## 2021-05-04 MED ORDER — AMOXICILLIN-POT CLAVULANATE 875-125 MG PO TABS: 1.0000 | ORAL_TABLET | Freq: Two times a day (BID) | ORAL | 0 refills | Status: DC

## 2021-05-04 NOTE — Progress Notes (Signed)
Established Patient Office Visit  Subjective:  Patient ID: Alyssa Clay, female    DOB: 1966-01-17  Age: 56 y.o. MRN: [id number]  CC:  Chief Complaint  Patient presents with   Headache    X 10 days, headache, light sensitive, sinus pressure, facial pain    HPI Alyssa Clay presents for persistent headache and facial pain and pressure especially right maxillary sinus.  She did virtual visit on 12-30 and was prescribed doxycycline without much improvement.  She then followed up with her neurology specialist and was given prednisone taper.  She was not sure at first whether her headache may be more related to migraine versus sinusitis.  She has had some daily headaches.  Facial pressure and again mostly right maxillary.  Occasional cough.  Increased nasal congestion.  Tried Mucinex D without improvement.  Past Medical History:  Diagnosis Date   Allergy    Anemia    Asthma    Constipation    issues x the past month -? 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   Enlarged ovary 09/15/2020   noted on ct scan, per pt   FUO (fever of unknown origin) 10/21/2019   GERD (gastroesophageal reflux disease)    Hypertension    PAST hx    Migraine     Past Surgical History:  Procedure Laterality Date   APPENDECTOMY     BREAST REDUCTION SURGERY Bilateral 06/12/2019   Procedure: BREAST REDUCTION WITH LIPOSUCTION;  Surgeon: Wallace Going, DO;  Location: [facility];  Service: Plastics;  Laterality: Bilateral;  4 hours, please   [place] AND CURETTAGE OF UTERUS     multiple due to miscarriages   KNEE ARTHROSCOPY Left 2013   LAPAROSCOPIC APPENDECTOMY N/A 12/08/2018   Procedure: APPENDECTOMY LAPAROSCOPIC;  Surgeon: Donnie Mesa, MD;  Location: [facility];  Service: General;  Laterality: N/A;   TENNIS ELBOW RELEASE/NIRSCHEL PROCEDURE  2009   WISDOM TOOTH EXTRACTION       Family History  Problem Relation Age of Onset   Depression Mother    Lymphoma Mother 82   Hypertension Father    Heart attack Father 70   Hypertension Brother    High blood pressure Brother    High Cholesterol Brother    Healthy Daughter    Colon cancer Maternal Aunt 7   Esophageal cancer Neg Hx    Pancreatic cancer Neg Hx    Stomach cancer Neg Hx    Colon polyps Neg Hx    Rectal cancer Neg Hx     Social History   Socioeconomic History   Marital status: Married    Spouse name: Not on file   Number of children: Not on file   Years of education: Not on file   Highest education level: Not on file  Occupational History   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  Tobacco Use   Smoking status: Never   Smokeless tobacco: Never  Vaping Use   Vaping Use: Never used  Substance and Sexual Activity   Alcohol use: Yes    Comment: occ 2-3 beers a month    Drug use: Never   Sexual activity: Yes    Partners: Male  Other Topics Concern   Not on file  Social History Narrative   Not on file   Social Determinants of Health   Financial Resource Strain: Not on  file  Food Insecurity: Not on file  Transportation Needs: Not on file  Physical Activity: Not on file  Stress: Not on file  Social Connections: Not on file  Intimate Partner Violence: Not on file    Outpatient Medications Prior to Visit  Medication Sig Dispense Refill   albuterol (PROAIR HFA) 108 (90 Base) MCG/ACT inhaler Inhale 2 puffs into the lungs every 4 (four) hours as needed for wheezing or shortness of breath. 1 each 1   albuterol (PROVENTIL) (2.5 MG/3ML) 0.083% nebulizer solution Take 3 mLs (2.5 mg total) by nebulization every 4 (four) hours as needed for wheezing or shortness of breath (coughing fits). 75 mL 2   Atogepant (QULIPTA PO) Take by mouth.     budesonide (PULMICORT) 0.5 MG/2ML nebulizer solution TAKE 2 MLS (0.5 MG TOTAL) BY NEBULIZATION IN THE MORNING AND AT BEDTIME. TAKE DURING UPPER RESPIRATORY INFECTIONS FOR 1-2 WEEKS  AT A TIME. 360 mL 0   budesonide-formoterol (SYMBICORT) 160-4.5 MCG/ACT inhaler USE 2 INHALATIONS BY MOUTH  TWICE DAILY 30.6 g 3   buPROPion (WELLBUTRIN XL) 150 MG 24 hr tablet TAKE 1 TABLET BY MOUTH  DAILY 90 tablet 1   calcium carbonate (OSCAL) 1500 (600 Ca) MG TABS tablet Take 600 mg of elemental calcium by mouth 2 (two) times daily with a meal.     cyclobenzaprine (FLEXERIL) 10 MG tablet Take 10 mg by mouth 3 (three) times daily as needed (migraines).     DULoxetine (CYMBALTA) 60 MG capsule TAKE 1 CAPSULE BY MOUTH  DAILY 90 capsule 3   Estradiol (YUVAFEM) 10 MCG TABS vaginal tablet Place 1 tablet (10 mcg total) vaginally daily. 8 tablet 0   FASENRA 30 MG/ML SOSY SECOND SHIP: INJECT ONE SYRINGE UNDER THE SKIN AT WEEK 4 AND 8, THEN EVERY 8 WEEKS THEREAFTER. (Patient taking differently: Inject 30 mg into the skin See admin instructions. Every 8 weeks) 1 Syringe 8   gabapentin (NEURONTIN) 300 MG capsule Take 300-900 mg by mouth See admin instructions. Take 1 tablet every AM, three tablets at bedtime     hydrOXYzine (ATARAX) 10 MG tablet Take 1 tablet (10 mg total) by mouth 3 (three) times daily as needed. 30 tablet 0   meloxicam (MOBIC) 15 MG tablet TAKE 1 TABLET (15 MG TOTAL) BY MOUTH DAILY. 30 tablet 1   meloxicam (MOBIC) 15 MG tablet Take 1 tablet (15 mg total) by mouth daily. 30 tablet 2   montelukast (SINGULAIR) 10 MG tablet TAKE 1 TABLET BY MOUTH  DAILY 90 tablet 1   Multiple Vitamin (MULTIVITAMIN WITH MINERALS) TABS tablet Take 1 tablet by mouth daily.     nystatin cream (MYCOSTATIN) Apply 1 application topically 2 (two) times daily. 30 g 0   pantoprazole (PROTONIX) 40 MG tablet TAKE 1 TABLET BY MOUTH 2 TIMES DAILY BEFORE A MEAL. 180 tablet 1   Respiratory Therapy Supplies (FLUTTER) DEVI Use 3 times daily as directed 1 each 0   rizatriptan (MAXALT) 10 MG tablet TAKE 1 TABLET BY MOUTH ONCE AS NEEDED FOR MIGRAINE FOR UP TO 1 DOSE. MAY REPEAT IN 2 HOURS IF NEEDED     rosuvastatin (CRESTOR) 5 MG  tablet Take 1 tablet (5 mg total) by mouth at bedtime. 90 tablet 3   Semaglutide,0.25 or 0.5MG /DOS, (OZEMPIC, 0.25 OR 0.5 MG/DOSE,) 2 MG/1.5ML SOPN Inject 0.25 mg into the skin once a week for 30 days, THEN 0.5 mg once a week. 2.25 mL 2   tiZANidine (ZANAFLEX) 4 MG tablet Take 4 mg by  mouth every 6 (six) hours as needed for muscle spasms.     traZODone (DESYREL) 100 MG tablet Take 1-2 tablets (100-200 mg total) by mouth at bedtime as needed for sleep. 180 tablet 3   valACYclovir (VALTREX) 1000 MG tablet Take 1 tablet (1,000 mg total) by mouth 2 (two) times daily. 180 tablet 1   doxycycline (VIBRAMYCIN) 100 MG capsule Take 1 capsule (100 mg total) by mouth 2 (two) times daily. 20 capsule 0   Facility-Administered Medications Prior to Visit  Medication Dose Route Frequency Provider Last Rate Last Admin   0.9 %  sodium chloride infusion  500 mL Intravenous Once [place] Park, MD       tezepelumab-ekko (TEZSPIRE) 210 MG/1.91ML syringe 210 mg  210 mg Subcutaneous Q28 days Garnet Sierras, DO   210 mg at 04/28/21 1318    No Known Allergies  ROS Review of Systems  Constitutional:  Negative for chills and fever.  HENT:  Positive for sinus pressure and sinus pain.   Respiratory:  Positive for cough.   Neurological:  Positive for headaches.     Objective:    Physical Exam Vitals reviewed.  Constitutional:      Appearance: She is well-developed.  HENT:     Head:     Comments: She has some crusted drainage right naris    Mouth/Throat:     Mouth: Mucous membranes are moist.     Pharynx: Oropharynx is clear.  Cardiovascular:     Rate and Rhythm: Normal rate and regular rhythm.  Pulmonary:     Effort: Pulmonary effort is normal.     Breath sounds: Normal breath sounds.  Musculoskeletal:     Cervical back: Neck supple.  Lymphadenopathy:     Cervical: No cervical adenopathy.  Neurological:     Mental Status: She is alert.    BP (!) 154/80 (BP Location: Left Arm, Patient Position:  Sitting, Cuff Size: Normal)    Pulse (!) 105    Temp 97.7 F (36.5 C) (Oral)    Wt 259 lb 3.2 oz (117.6 kg)    LMP 12/20/2018    SpO2 95%    BMI 43.13 kg/m  Wt Readings from Last 3 Encounters:  05/04/21 259 lb 3.2 oz (117.6 kg)  04/05/21 256 lb (116.1 kg)  03/01/21 255 lb (115.7 kg)     Health Maintenance Due  Topic Date Due   Zoster Vaccines- Shingrix (2 of 2) 04/21/2020    There are no preventive care reminders to display for this patient.  Lab Results  Component Value Date   TSH 1.98 02/25/2020   Lab Results  Component Value Date   WBC 7.8 01/19/2021   HGB 12.7 01/19/2021   HCT 38.0 01/19/2021   MCV 91.3 01/19/2021   PLT 313 01/19/2021   Lab Results  Component Value Date   NA 141 01/19/2021   K 3.9 01/19/2021   CO2 28 01/19/2021   GLUCOSE 117 (H) 01/19/2021   BUN 11 01/19/2021   CREATININE 0.88 01/19/2021   BILITOT 0.3 11/04/2020   ALKPHOS 64 11/04/2020   AST 15 11/04/2020   ALT 19 11/04/2020   PROT 7.2 11/04/2020   ALBUMIN 4.2 11/04/2020   CALCIUM 9.7 01/19/2021   ANIONGAP 9 01/19/2021   GFR 61.76 08/30/2020   Lab Results  Component Value Date   CHOL 189 02/02/2021   Lab Results  Component Value Date   HDL 59.10 02/02/2021   Lab Results  Component Value Date   LDLCALC 100 (  H) 02/02/2021   Lab Results  Component Value Date   TRIG 149.0 02/02/2021   Lab Results  Component Value Date   CHOLHDL 3 02/02/2021   Lab Results  Component Value Date   HGBA1C 5.9 02/02/2021      Assessment & Plan:   Probable acute right maxillary sinusitis.  Not improving with doxycycline.  We discussed broadening coverage to Augmentin to cover for anaerobes 875 mg twice daily for 10 days.  Continue plain Mucinex twice daily  -Touch base for any persistent or worsening symptoms -Consider limited maxillofacial CT if not improving with the above  Meds ordered this encounter  Medications   amoxicillin-clavulanate (AUGMENTIN) 875-125 MG tablet    Sig: Take 1  tablet by mouth 2 (two) times daily.    Dispense:  20 tablet    Refill:  0    Follow-up: No follow-ups on file.    Carolann Littler, MD

## 2021-05-04 NOTE — Telephone Encounter (Signed)
Patient calling in with respiratory symptoms: Shortness of breath, chest pain, palpitations or other red words send to Triage  Does the patient have a fever over 100, cough, congestion, sore throat, runny nose, lost of taste/smell (please list symptoms that patient has)? Ha and facial pain  What date did symptoms start?04-24-2021 (If over 5 days ago, pt may be scheduled for in person visit)  Have you tested for Covid in the last 5 days? No   If yes, was it positive []  OR negative [] ? If positive in the last 5 days, please schedule virtual visit now. If negative, schedule for an in person OV with the next available provider if PCP has no openings. Please also let patient know they will be tested again (follow the script below)  "you will have to arrive 63mins prior to your appt time to be Covid tested. Please park in back of office at the [facility] & call [phone] to let the staff know you have arrived. A staff member will meet you at your car to do a rapid covid test. Once the test has resulted you will be notified by phone of your results to determine if appt will remain an in person visit or be converted to a virtual/phone visit. If you arrive less than 85mins before your appt time, your visit will be automatically converted to virtual & any recommended testing will happen AFTER the visit." Pt has an appt with dr Elease Hashimoto today  [place]  If no availability for virtual visit in office,  please schedule another [place] office  If no availability at another [place] office, please instruct patient that they can schedule an evisit or virtual visit through their mychart account. Visits up to 8pm  patients can be seen in office 5 days after positive COVID test

## 2021-05-09 ENCOUNTER — Other Ambulatory Visit: Payer: Self-pay | Admitting: Family Medicine

## 2021-05-17 ENCOUNTER — Ambulatory Visit: Payer: 59 | Admitting: Family Medicine

## 2021-05-26 ENCOUNTER — Ambulatory Visit: Payer: 59

## 2021-05-26 DIAGNOSIS — G43709 Chronic migraine without aura, not intractable, without status migrainosus: Secondary | ICD-10-CM | POA: Insufficient documentation

## 2021-05-30 ENCOUNTER — Ambulatory Visit: Payer: 59

## 2021-06-01 ENCOUNTER — Telehealth (INDEPENDENT_AMBULATORY_CARE_PROVIDER_SITE_OTHER): Payer: 59 | Admitting: Family Medicine

## 2021-06-01 ENCOUNTER — Encounter: Payer: Self-pay | Admitting: Family Medicine

## 2021-06-01 VITALS — BP 130/75 | HR 99

## 2021-06-01 DIAGNOSIS — M26623 Arthralgia of bilateral temporomandibular joint: Secondary | ICD-10-CM | POA: Diagnosis not present

## 2021-06-01 DIAGNOSIS — G43801 Other migraine, not intractable, with status migrainosus: Secondary | ICD-10-CM

## 2021-06-01 DIAGNOSIS — F419 Anxiety disorder, unspecified: Secondary | ICD-10-CM

## 2021-06-01 DIAGNOSIS — M542 Cervicalgia: Secondary | ICD-10-CM

## 2021-06-01 MED ORDER — CLONAZEPAM 0.5 MG PO TABS: 0.5000 mg | ORAL_TABLET | Freq: Every day | ORAL | 2 refills | Status: DC | PRN

## 2021-06-01 NOTE — Progress Notes (Signed)
Virtual Visit via Video Note  I connected with Alyssa Clay on 06/01/21 at  2:00 PM EST by a video enabled telemedicine application and verified that I am speaking with the correct person using two identifiers.  Location patient: home Location provider: [place], [place] [address] Persons participating in the virtual visit: patient, provider  I discussed the limitations of evaluation and management by telemedicine and the availability of in person appointments. The patient expressed understanding and agreed to proceed.   Alyssa Clay DOB: Dec 27, 1965 Encounter date: 06/01/2021  This is a 56 y.o. female who presents with Chief Complaint  Patient presents with   Anxiety    Patient complains of increased anxiety x1 month, especially noted since she found out her mother has lung cancer    History of present illness: Found out 2 mo ago that mother has lung cancer. Has been to see her twice (in [place]) since finding out. Not great relationship with mom. Brother is up there (recently remarried). Brother goes there once a week; some cousins in area. She has been falling a lot. Was in hospital and almost passed away from effects of chemo. RBC dropped. Had transfusions.   Patient wears night guard at night, but thinks during day she is clenching teeth, getting headaches. Tried some new meds. Roselyn Meier takes edge off, but not taking whole headache away. Very debilitating. Hasn't spent time with family. Lighting bothering her. Just forcing self to do things.  For headaches, she has been following up with [facility].  She has tried multiple treatments for her headaches including gabapentin (did not work), Education officer, environmental (very trying), Conservation officer, nature (worked well for a month but then headaches started back), Flexeril (stopped seeming to help), Maxalt (decreased headache but did not resolve it), Ubrelvy with taper (headache came back), Amerge (improved,  but headache not gone), hydroxyzine (bloody noses, dry), Aimovig (worked for a while but then stopped working), Botox.  Trying to get out of house and do things on rainy days. Hard time with sunny days. Doesn't want to take more meds for migraines, but wondering what else can be done.   Will be getting shot in SI joint on 2/14; hoping this helps with her pain. She hasn't been exercising because of this.    No Known Allergies Current Meds  Medication Sig   albuterol (PROAIR HFA) 108 (90 Base) MCG/ACT inhaler Inhale 2 puffs into the lungs every 4 (four) hours as needed for wheezing or shortness of breath.   albuterol (PROVENTIL) (2.5 MG/3ML) 0.083% nebulizer solution Take 3 mLs (2.5 mg total) by nebulization every 4 (four) hours as needed for wheezing or shortness of breath (coughing fits).   Atogepant (QULIPTA PO) Take by mouth.   budesonide (PULMICORT) 0.5 MG/2ML nebulizer solution TAKE 2 MLS (0.5 MG TOTAL) BY NEBULIZATION IN THE MORNING AND AT BEDTIME. TAKE DURING UPPER RESPIRATORY INFECTIONS FOR 1-2 WEEKS AT A TIME.   budesonide-formoterol (SYMBICORT) 160-4.5 MCG/ACT inhaler USE 2 INHALATIONS BY MOUTH  TWICE DAILY   buPROPion (WELLBUTRIN XL) 150 MG 24 hr tablet TAKE 1 TABLET BY MOUTH  DAILY   calcium carbonate (OSCAL) 1500 (600 Ca) MG TABS tablet Take 600 mg of elemental calcium by mouth 2 (two) times daily with a meal.   cyclobenzaprine (FLEXERIL) 10 MG tablet Take 10 mg by mouth 3 (three) times daily as needed (migraines).   DULoxetine (CYMBALTA) 60 MG capsule TAKE 1 CAPSULE BY MOUTH  DAILY   Estradiol (  YUVAFEM) 10 MCG TABS vaginal tablet Place 1 tablet (10 mcg total) vaginally daily.   gabapentin (NEURONTIN) 300 MG capsule Take 300-900 mg by mouth See admin instructions. Take 1 tablet every AM, three tablets at bedtime   meloxicam (MOBIC) 15 MG tablet Take 1 tablet (15 mg total) by mouth daily.   montelukast (SINGULAIR) 10 MG tablet TAKE 1 TABLET BY MOUTH  DAILY   Multiple Vitamin  (MULTIVITAMIN WITH MINERALS) TABS tablet Take 1 tablet by mouth daily.   pantoprazole (PROTONIX) 40 MG tablet TAKE 1 TABLET BY MOUTH 2 TIMES DAILY BEFORE A MEAL.   Prochlorperazine Maleate (COMPAZINE PO) Take by mouth as needed.   Respiratory Therapy Supplies (FLUTTER) DEVI Use 3 times daily as directed   rizatriptan (MAXALT) 10 MG tablet TAKE 1 TABLET BY MOUTH ONCE AS NEEDED FOR MIGRAINE FOR UP TO 1 DOSE. MAY REPEAT IN 2 HOURS IF NEEDED   rosuvastatin (CRESTOR) 5 MG tablet Take 1 tablet (5 mg total) by mouth at bedtime.   Semaglutide,0.25 or 0.5MG /DOS, (OZEMPIC, 0.25 OR 0.5 MG/DOSE,) 2 MG/1.5ML SOPN Inject 0.25 mg into the skin once a week for 30 days, THEN 0.5 mg once a week.   traZODone (DESYREL) 100 MG tablet TAKE 1 TO 2 TABLETS BY  MOUTH AT BEDTIME AS NEEDED  FOR SLEEP   Ubrogepant (UBRELVY PO) Take by mouth as needed.   valACYclovir (VALTREX) 1000 MG tablet Take 1 tablet (1,000 mg total) by mouth 2 (two) times daily.   Current Facility-Administered Medications for the 06/01/21 encounter (Video Visit) with Caren Macadam, MD  Medication   0.9 %  sodium chloride infusion   tezepelumab-ekko (TEZSPIRE) 210 MG/1.91ML syringe 210 mg    Review of Systems  Constitutional:  Positive for fatigue. Negative for chills and fever.  Respiratory:  Negative for cough, chest tightness, shortness of breath and wheezing.   Cardiovascular:  Negative for chest pain, palpitations and leg swelling.  Neurological:  Positive for headaches.  Psychiatric/Behavioral:  Positive for decreased concentration. The patient is nervous/anxious.    Objective:  BP 130/75    Pulse 99    LMP 12/20/2018       BP Readings from Last 3 Encounters:  06/01/21 130/75  05/04/21 (!) 154/80  04/05/21 134/82   Wt Readings from Last 3 Encounters:  05/04/21 259 lb 3.2 oz (117.6 kg)  04/05/21 256 lb (116.1 kg)  03/01/21 255 lb (115.7 kg)    EXAM:  GENERAL: alert, oriented, appears well and in no acute distress  HEENT:  atraumatic, conjunctiva clear, no obvious abnormalities on inspection of external nose and ears  NECK: normal movements of the head and neck  LUNGS: on inspection no signs of respiratory distress, breathing rate appears normal, no obvious gross SOB, gasping or wheezing  CV: no obvious cyanosis  MS: moves all visible extremities without noticeable abnormality  PSYCH/NEURO: pleasant and cooperative, no obvious depression or anxiety, speech and thought processing grossly intact   Assessment/Plan   1. Other migraine with status migrainosus, not intractable Encouraged her to follow back up with a headache specialist.  She has tried a multitude of medications and not been able to stop headache.  We talked about some other factors that she can control including jaw clenching.  TMJ release was demonstrated since she does feel that she is clenching/grinding.  We also discussed meditation app to help with calming and anxiety, which is likely contributing to her migraines.  Instructions from last visit with her headache specialist were to  look into insurance coverage for some of the injections that may be helpful.  She lost this information, but I sent it to her via MyChart today so that she can further investigate.  2. Anxiety In an effort to not overmedicate her, we are just going to give some Klonopin for as needed use.  This is more of an emergency medication for her to use if needed with stressful encounters with mom, difficulty with falling back asleep at night.  She is going to try some of the other modalities to help with anxiety including getting back on track with regular exercise, meditation.  Encouraged her to follow-up with prior counselor (she is out of state but does virtual visits).  If anxiety is still worsening with above measures, we can consider medication change.  3. TMJ tenderness, bilateral See above.     I discussed the assessment and treatment plan with the patient. The  patient was provided an opportunity to ask questions and all were answered. The patient agreed with the plan and demonstrated an understanding of the instructions.   The patient was advised to call back or seek an in-person evaluation if the symptoms worsen or if the condition fails to improve as anticipated.  I provided 35 minutes of face-to-face time during this encounter.   Micheline Rough, MD

## 2021-06-14 ENCOUNTER — Encounter: Payer: Self-pay | Admitting: Family Medicine

## 2021-06-14 DIAGNOSIS — K219 Gastro-esophageal reflux disease without esophagitis: Secondary | ICD-10-CM

## 2021-06-14 DIAGNOSIS — G4733 Obstructive sleep apnea (adult) (pediatric): Secondary | ICD-10-CM

## 2021-06-14 DIAGNOSIS — J4550 Severe persistent asthma, uncomplicated: Secondary | ICD-10-CM

## 2021-06-14 DIAGNOSIS — I10 Essential (primary) hypertension: Secondary | ICD-10-CM

## 2021-06-14 DIAGNOSIS — R7301 Impaired fasting glucose: Secondary | ICD-10-CM

## 2021-06-15 ENCOUNTER — Ambulatory Visit: Payer: 59

## 2021-06-16 ENCOUNTER — Encounter: Payer: Self-pay | Admitting: Family Medicine

## 2021-06-17 ENCOUNTER — Telehealth: Payer: Self-pay | Admitting: Family Medicine

## 2021-06-17 NOTE — Telephone Encounter (Signed)
Error

## 2021-06-17 NOTE — Telephone Encounter (Signed)
Patient called to follow up on her PA for ozempic and to see if Dr.Koberlein and team had received her messages. I let her know that the messages had been routed back to her and we were waiting to hear something back. Patient verbalized understanding         Please advise

## 2021-06-20 ENCOUNTER — Encounter: Payer: Self-pay | Admitting: Family Medicine

## 2021-06-20 ENCOUNTER — Ambulatory Visit (INDEPENDENT_AMBULATORY_CARE_PROVIDER_SITE_OTHER): Payer: 59

## 2021-06-20 ENCOUNTER — Other Ambulatory Visit: Payer: Self-pay

## 2021-06-20 DIAGNOSIS — J4550 Severe persistent asthma, uncomplicated: Secondary | ICD-10-CM

## 2021-06-20 NOTE — Telephone Encounter (Signed)
See prior Mychart message.   

## 2021-06-20 NOTE — Telephone Encounter (Signed)
See My chart message

## 2021-06-22 ENCOUNTER — Telehealth: Payer: Self-pay | Admitting: Family Medicine

## 2021-06-22 MED ORDER — WEGOVY 0.25 MG/0.5ML ~~LOC~~ SOAJ: 0.2500 mg | SUBCUTANEOUS | 0 refills | Status: DC

## 2021-06-22 MED ORDER — SEMAGLUTIDE-WEIGHT MANAGEMENT 0.25 MG/0.5ML ~~LOC~~ SOAJ: 0.2500 mg | SUBCUTANEOUS | 2 refills | Status: DC

## 2021-06-22 NOTE — Addendum Note (Signed)
Addended by: Caren Macadam on: 06/22/2021 04:05 PM ? ? Modules accepted: Orders ? ?

## 2021-06-22 NOTE — Telephone Encounter (Signed)
See My chart message

## 2021-06-22 NOTE — Telephone Encounter (Signed)
Pt is calling and would like a sample of wegovy or ozempic . Pt has sent mychart message. Please advise ?

## 2021-06-22 NOTE — Telephone Encounter (Signed)
Noted  

## 2021-06-24 ENCOUNTER — Encounter: Payer: Self-pay | Admitting: Family Medicine

## 2021-06-28 ENCOUNTER — Other Ambulatory Visit: Payer: Self-pay | Admitting: Family Medicine

## 2021-06-28 ENCOUNTER — Encounter: Payer: Self-pay | Admitting: Family Medicine

## 2021-06-28 NOTE — Telephone Encounter (Signed)
Please see fax stating the request was denied. ?

## 2021-06-29 ENCOUNTER — Ambulatory Visit: Payer: 59 | Admitting: Allergy

## 2021-07-01 ENCOUNTER — Encounter: Payer: Self-pay | Admitting: Family Medicine

## 2021-07-01 DIAGNOSIS — Z6841 Body Mass Index (BMI) 40.0 and over, adult: Secondary | ICD-10-CM

## 2021-07-03 NOTE — Progress Notes (Signed)
Follow Up Note  RE: Alyssa Clay MRN: [id number] DOB: 1966/03/07 Date of Office Visit: 07/04/2021  Referring provider: Caren Macadam, MD Primary care provider: Caren Macadam, MD  Chief Complaint: Follow-up  History of Present Illness: I had the pleasure of seeing Alyssa Clay for a follow up visit at the [facility] on 07/04/2021. She is a 56 y.o. female, who is being followed for asthma on Tezspire, atelectasis, history of frequent URIs, nonallergic rhinitis and GERD. Her previous allergy office visit was on 02/28/2021 with Dr. Maudie Clay. Today is a regular follow up visit.  Severe persistent asthma Switched from [place] to [place] on 03/31/21 and has not noticed any difference yet.  Currently on Symbicort 161mg 2 puffs twice a day, Singulair '10mg'$  daily. Trying to remember to do the flutter valve and incentive spirometer.  No nebulizer machine use since the last visit.  Denies any ER/urgent care visits or prednisone use since the last visit.  Stressed as [place] has lung cancer [place] of [place] hospital for treatments.   Going on a cruise in May.   Atelectasis No oxygen use.    History of frequent upper respiratory infection Had a sinus infection last month and took antibiotics (Augmentin) for this with good benefit.   Nonallergic rhinitis Taking Claritin with good benefit. Not needing to use any nasal sprays.    Gastroesophageal reflux disease Stable.  Assessment and Plan: Alyssa Clay a 56y.o. female with: Severe persistent asthma Past history - Spiriva caused coughing. Normal alpha-1 level. Seen by pulmonary Interim history - started Tezspire on 03/31/21 with no issues. No prednisone since the last visit.  Today's spirometry showed restriction - worse than previous one.  Daily controller medication(s): Symbicort 1670m 2 puffs twice a day.  Continue Singulair (montelukast) '10mg'$  daily at night. Do the  flutter valve and incentive spirometer. Continue Tezspire injection every 4 weeks.  Will switch to at home once available.  During upper respiratory infections/asthma flares:  Start Budesonide 0.'5mg'$  nebulizer twice a day for 1-2 weeks until your breathing symptoms return to baseline.  Pretreat with albuterol 2 puffs or albuterol nebulizer.  If you need to use your albuterol nebulizer machine back to back within 15-30 minutes with no relief then please go to the ER/urgent care for further evaluation.  May use albuterol rescue inhaler 2 puffs every 4 to 6 hours as needed for shortness of breath, chest tightness, coughing, and wheezing. May use albuterol rescue inhaler 2 puffs 5 to 15 minutes prior to strenuous physical activities. Monitor frequency of use.  Keep track of infections/bronchitis. Get spirometry at next visit.  Atelectasis Past history - 9/9/202 CT chest showed: 1. Prominent elevation of the right hemidiaphragm with associated right lung base atelectasis including complete right middle lobe atelectasis. Right hemidiaphragm paralysis not excluded. Saw pulm. Interim history - doing well with no issues.  Encouraged using flutter valve and incentive spirometer.   History of frequent upper respiratory infection Past history - frequent bronchitis in her lifetime. Normal basic immune evaluation. Saw rheumatology and ID. Interim history - 1 course of antibiotics (Augmentin). Keep track of infections/bronchitis. Consider repeating immune labwork - last time was drawn in 2020.   Nonallergic rhinitis Past history - All seasonal and perennial aeroallergen skin tests are negative despite a positive histamine control.  Tried Flonase, Xhance, Astelin, Xyzal, Periactin, and Claritin without adequate symptom relief.  Interm history - better with  Claritin. Use over the counter antihistamines such as Zyrtec (cetirizine), Claritin (loratadine), Allegra (fexofenadine), or Xyzal (levocetirizine) daily  as needed.  May use Flonase (fluticasone) nasal spray 1 spray per nostril twice a day as needed for nasal congestion.  May use azelastine nasal spray 1-2 sprays per nostril twice a day as needed for runny nose/drainage. Nasal saline spray (i.e., Simply Saline) or nasal saline lavage (i.e., NeilMed) is recommended as needed and prior to medicated nasal sprays.  Gastroesophageal reflux disease Stable.  Continue reflux lifestyle modifications and diet.  May use Protonix as needed.  Return in about 4 months (around 11/03/2021).  Meds ordered this encounter  Medications   montelukast (SINGULAIR) 10 MG tablet    Sig: Take 1 tablet (10 mg total) by mouth daily.    Dispense:  90 tablet    Refill:  3   albuterol (VENTOLIN HFA) 108 (90 Base) MCG/ACT inhaler    Sig: Inhale 2 puffs into the lungs every 4 (four) hours as needed for wheezing or shortness of breath (coughing fits).    Dispense:  18 g    Refill:  1   budesonide-formoterol (SYMBICORT) 160-4.5 MCG/ACT inhaler    Sig: Inhale 2 puffs into the lungs in the morning and at bedtime. with spacer and rinse mouth afterwards.    Dispense:  3 each    Refill:  3   Lab Orders  No laboratory test(s) ordered today    Diagnostics: Spirometry:  Tracings reviewed. Her effort: Good reproducible efforts. FVC: 1.85L FEV1: 1.47L, 50% predicted FEV1/FVC ratio: 79% Interpretation: Spirometry consistent with restrictive disease.  Please see scanned spirometry results for details.  Medication List:  Current Outpatient Medications  Medication Sig Dispense Refill   albuterol (PROVENTIL) (2.5 MG/3ML) 0.083% nebulizer solution Take 3 mLs (2.5 mg total) by nebulization every 4 (four) hours as needed for wheezing or shortness of breath (coughing fits). 75 mL 2   albuterol (VENTOLIN HFA) 108 (90 Base) MCG/ACT inhaler Inhale 2 puffs into the lungs every 4 (four) hours as needed for wheezing or shortness of breath (coughing fits). 18 g 1   Atogepant (QULIPTA  PO) Take by mouth.     budesonide (PULMICORT) 0.5 MG/2ML nebulizer solution TAKE 2 MLS (0.5 MG TOTAL) BY NEBULIZATION IN THE MORNING AND AT BEDTIME. TAKE DURING UPPER RESPIRATORY INFECTIONS FOR 1-2 WEEKS AT A TIME. 360 mL 0   budesonide-formoterol (SYMBICORT) 160-4.5 MCG/ACT inhaler Inhale 2 puffs into the lungs in the morning and at bedtime. with spacer and rinse mouth afterwards. 3 each 3   buPROPion (WELLBUTRIN XL) 150 MG 24 hr tablet TAKE 1 TABLET BY MOUTH  DAILY 90 tablet 1   clonazePAM (KLONOPIN) 0.5 MG tablet Take 1 tablet (0.5 mg total) by mouth daily as needed for anxiety. 30 tablet 2   cyclobenzaprine (FLEXERIL) 10 MG tablet Take 10 mg by mouth 3 (three) times daily as needed (migraines).     DULoxetine (CYMBALTA) 60 MG capsule TAKE 1 CAPSULE BY MOUTH  DAILY 90 capsule 3   Estradiol (YUVAFEM) 10 MCG TABS vaginal tablet Place 1 tablet (10 mcg total) vaginally daily. 8 tablet 0   gabapentin (NEURONTIN) 300 MG capsule Take 300-900 mg by mouth See admin instructions. Take 1 tablet every AM, three tablets at bedtime     Multiple Vitamin (MULTIVITAMIN WITH MINERALS) TABS tablet Take 1 tablet by mouth daily.     pantoprazole (PROTONIX) 40 MG tablet TAKE 1 TABLET BY MOUTH 2 TIMES DAILY BEFORE A MEAL. 180 tablet 1  Prochlorperazine Maleate (COMPAZINE PO) Take by mouth as needed.     Respiratory Therapy Supplies (FLUTTER) DEVI Use 3 times daily as directed 1 each 0   rizatriptan (MAXALT) 10 MG tablet TAKE 1 TABLET BY MOUTH ONCE AS NEEDED FOR MIGRAINE FOR UP TO 1 DOSE. MAY REPEAT IN 2 HOURS IF NEEDED     rosuvastatin (CRESTOR) 5 MG tablet Take 1 tablet (5 mg total) by mouth at bedtime. 90 tablet 3   Semaglutide-Weight Management (WEGOVY) 0.25 MG/0.5ML SOAJ Inject 0.25 mg into the skin as directed. 2 mL 0   Semaglutide-Weight Management 0.25 MG/0.5ML SOAJ Inject 0.25 mg into the skin once a week for 28 days. 2 mL 2   traZODone (DESYREL) 100 MG tablet TAKE 1 TO 2 TABLETS BY  MOUTH AT BEDTIME AS NEEDED   FOR SLEEP 180 tablet 3   Ubrogepant (UBRELVY PO) Take by mouth as needed.     valACYclovir (VALTREX) 1000 MG tablet Take 1 tablet (1,000 mg total) by mouth 2 (two) times daily. 180 tablet 1   montelukast (SINGULAIR) 10 MG tablet Take 1 tablet (10 mg total) by mouth daily. 90 tablet 3   Current Facility-Administered Medications  Medication Dose Route Frequency Provider Last Rate Last Admin   0.9 %  sodium chloride infusion  500 mL Intravenous Once [place] Park, MD       tezepelumab-ekko (TEZSPIRE) 210 MG/1.91ML syringe 210 mg  210 mg Subcutaneous Q28 days Garnet Sierras, DO   210 mg at 06/20/21 1042   Allergies: No Known Allergies I reviewed her past medical history, social history, family history, and environmental history and no significant changes have been reported from her previous visit.  Review of Systems  Constitutional:  Negative for appetite change, chills, fever and unexpected weight change.  HENT:  Negative for congestion and rhinorrhea.   Eyes:  Negative for itching.  Respiratory:  Negative for cough, chest tightness, shortness of breath and wheezing.   Gastrointestinal:  Negative for abdominal pain.  Skin:  Negative for rash.  Allergic/Immunologic: Positive for environmental allergies.  Neurological:  Positive for headaches.   Objective: BP 122/72 (BP Location: Left Arm, Patient Position: Sitting, Cuff Size: Normal)    Pulse 95    Temp 98 F (36.7 C)    Resp 18    LMP 12/20/2018    SpO2 97%  There is no height or weight on file to calculate BMI. Physical Exam Vitals and nursing note reviewed.  Constitutional:      Appearance: Normal appearance. She is well-developed.  HENT:     Head: Normocephalic and atraumatic.     Right Ear: Tympanic membrane and external ear normal.     Left Ear: Tympanic membrane and external ear normal.     Nose: Nose normal.     Mouth/Throat:     Mouth: Mucous membranes are moist.     Pharynx: Oropharynx is clear.  Eyes:      Conjunctiva/sclera: Conjunctivae normal.  Cardiovascular:     Rate and Rhythm: Normal rate and regular rhythm.     Heart sounds: Normal heart sounds. No murmur heard. Pulmonary:     Effort: Pulmonary effort is normal.     Breath sounds: Normal breath sounds. No wheezing, rhonchi or rales.  Musculoskeletal:     Cervical back: Neck supple.  Skin:    General: Skin is warm.     Findings: No rash.  Neurological:     Mental Status: She is alert and oriented to person, place,  and time.  Psychiatric:        Behavior: Behavior normal.   Previous notes and tests were reviewed. The plan was reviewed with the patient/family, and all questions/concerned were addressed.  It was my pleasure to see Avenell today and participate in her care. Please feel free to contact me with any questions or concerns.  Sincerely,  Rexene Alberts, DO Allergy & Immunology  [facility] office: [place] office: [phone]

## 2021-07-04 ENCOUNTER — Ambulatory Visit (INDEPENDENT_AMBULATORY_CARE_PROVIDER_SITE_OTHER): Payer: 59 | Admitting: Allergy

## 2021-07-04 ENCOUNTER — Encounter: Payer: Self-pay | Admitting: Allergy

## 2021-07-04 ENCOUNTER — Other Ambulatory Visit: Payer: Self-pay

## 2021-07-04 VITALS — BP 122/72 | HR 95 | Temp 98.0°F | Resp 18

## 2021-07-04 DIAGNOSIS — J9811 Atelectasis: Secondary | ICD-10-CM | POA: Diagnosis not present

## 2021-07-04 DIAGNOSIS — J4550 Severe persistent asthma, uncomplicated: Secondary | ICD-10-CM | POA: Diagnosis not present

## 2021-07-04 DIAGNOSIS — J310 Chronic rhinitis: Secondary | ICD-10-CM

## 2021-07-04 DIAGNOSIS — K219 Gastro-esophageal reflux disease without esophagitis: Secondary | ICD-10-CM | POA: Diagnosis not present

## 2021-07-04 DIAGNOSIS — Z8709 Personal history of other diseases of the respiratory system: Secondary | ICD-10-CM

## 2021-07-04 MED ORDER — ALBUTEROL SULFATE HFA 108 (90 BASE) MCG/ACT IN AERS: 2.0000 | INHALATION_SPRAY | RESPIRATORY_TRACT | 1 refills | Status: DC | PRN

## 2021-07-04 MED ORDER — BUDESONIDE-FORMOTEROL FUMARATE 160-4.5 MCG/ACT IN AERO: 2.0000 | INHALATION_SPRAY | Freq: Two times a day (BID) | RESPIRATORY_TRACT | 3 refills | Status: DC

## 2021-07-04 MED ORDER — MONTELUKAST SODIUM 10 MG PO TABS: 10.0000 mg | ORAL_TABLET | Freq: Every day | ORAL | 3 refills | Status: DC

## 2021-07-04 NOTE — Assessment & Plan Note (Signed)
Past history - 9/9/202 CT chest showed: 1. Prominent elevation of the right hemidiaphragm with associated right lung base atelectasis including complete right middle lobe atelectasis. Right hemidiaphragm paralysis not excluded. Saw pulm. ?Interim history - doing well with no issues.  ?? Encouraged using flutter valve and incentive spirometer.  ?

## 2021-07-04 NOTE — Assessment & Plan Note (Signed)
Past history - frequent bronchitis in her lifetime. Normal basic immune evaluation. Saw rheumatology and ID. ?Interim history - 1 course of antibiotics (Augmentin). ?? Keep track of infections/bronchitis. ?? Consider repeating immune labwork - last time was drawn in 2020.  ?

## 2021-07-04 NOTE — Assessment & Plan Note (Signed)
Past history - Spiriva caused coughing. Normal alpha-1 level. Seen by pulmonary ?Interim history - started Tezspire on 03/31/21 with no issues. No prednisone since the last visit.  ?? Today's spirometry showed restriction - worse than previous one.  ?? Daily controller medication(s): Symbicort 128mg 2 puffs twice a day.  ?? Continue Singulair (montelukast) '10mg'$  daily at night. ?? Do the flutter valve and incentive spirometer. ?? Continue Tezspire injection every 4 weeks.  ?o Will switch to at home once available.  ?? During upper respiratory infections/asthma flares:  ?o Start Budesonide 0.'5mg'$  nebulizer twice a day for 1-2 weeks until your breathing symptoms return to baseline.  ?o Pretreat with albuterol 2 puffs or albuterol nebulizer.  ?o If you need to use your albuterol nebulizer machine back to back within 15-30 minutes with no relief then please go to the ER/urgent care for further evaluation.  ?? May use albuterol rescue inhaler 2 puffs every 4 to 6 hours as needed for shortness of breath, chest tightness, coughing, and wheezing. May use albuterol rescue inhaler 2 puffs 5 to 15 minutes prior to strenuous physical activities. Monitor frequency of use.  ?? Keep track of infections/bronchitis. ?? Get spirometry at next visit. ?

## 2021-07-04 NOTE — Assessment & Plan Note (Signed)
Past history - All seasonal and perennial aeroallergen skin tests are negative despite a positive histamine control.  Tried Flonase, Xhance, Astelin, Xyzal, Periactin, and Claritin without adequate symptom relief.  ?Interm history - better with Claritin. ?? Use over the counter antihistamines such as Zyrtec (cetirizine), Claritin (loratadine), Allegra (fexofenadine), or Xyzal (levocetirizine) daily as needed.  ?? May use Flonase (fluticasone) nasal spray 1 spray per nostril twice a day as needed for nasal congestion.  ?? May use azelastine nasal spray 1-2 sprays per nostril twice a day as needed for runny nose/drainage. ?? Nasal saline spray (i.e., Simply Saline) or nasal saline lavage (i.e., NeilMed) is recommended as needed and prior to medicated nasal sprays. ?

## 2021-07-04 NOTE — Patient Instructions (Addendum)
Asthma/atelectasis ?Daily controller medication(s): Symbicort 160mg 2 puffs twice a day.  ?Continue Singulair (montelukast) '10mg'$  daily at night. ?Do the flutter valve and incentive spirometer. ?Continue Tezspire injection every 4 weeks.  ?Will switch to at home once available.  ?During upper respiratory infections/asthma flares:  ?Start Budesonide 0.'5mg'$  nebulizer twice a day for 1-2 weeks until your breathing symptoms return to baseline.  ?Pretreat with albuterol 2 puffs or albuterol nebulizer.  ?If you need to use your albuterol nebulizer machine back to back within 15-30 minutes with no relief then please go to the ER/urgent care for further evaluation.  ?May use albuterol rescue inhaler 2 puffs every 4 to 6 hours as needed for shortness of breath, chest tightness, coughing, and wheezing. May use albuterol rescue inhaler 2 puffs 5 to 15 minutes prior to strenuous physical activities. Monitor frequency of use.  ?Asthma control goals:  ?Full participation in all desired activities (may need albuterol before activity) ?Albuterol use two times or less a week on average (not counting use with activity) ?Cough interfering with sleep two times or less a month ?Oral steroids no more than once a year ?No hospitalizations  ?Keep track of infections/bronchitis. ? ?Chronic rhinitis ?Use over the counter antihistamines such as Zyrtec (cetirizine), Claritin (loratadine), Allegra (fexofenadine), or Xyzal (levocetirizine) daily as needed.  ?May use Flonase (fluticasone) nasal spray 1 spray per nostril twice a day as needed for nasal congestion.  ?May use azelastine nasal spray 1-2 sprays per nostril twice a day as needed for runny nose/drainage. ?Nasal saline spray (i.e., Simply Saline) or nasal saline lavage (i.e., NeilMed) is recommended as needed and prior to medicated nasal sprays. ?  ?Gastroesophageal reflux disease ?Continue reflux lifestyle modifications and diet.  ?Only use Protonix if needed.  ? ?Follow up in 4 months  or sooner if needed.  ?Will get bloodwork at next visit if still having infections.  ?

## 2021-07-04 NOTE — Assessment & Plan Note (Signed)
Stable.  ?? Continue reflux lifestyle modifications and diet.  ?? May use Protonix as needed. ?

## 2021-07-05 ENCOUNTER — Other Ambulatory Visit: Payer: Self-pay | Admitting: *Deleted

## 2021-07-05 MED ORDER — ALBUTEROL SULFATE HFA 108 (90 BASE) MCG/ACT IN AERS: 2.0000 | INHALATION_SPRAY | Freq: Four times a day (QID) | RESPIRATORY_TRACT | 1 refills | Status: DC | PRN

## 2021-07-06 ENCOUNTER — Telehealth: Payer: Self-pay | Admitting: Family Medicine

## 2021-07-06 NOTE — Telephone Encounter (Signed)
Isabel with [facility] is calling the Mancel Parsons has been approval from 06-24-2021 until 02-03-2022 reference number [id number] and she will fax over the letter ?

## 2021-07-08 ENCOUNTER — Other Ambulatory Visit: Payer: Self-pay | Admitting: Family Medicine

## 2021-07-08 MED ORDER — WEGOVY 0.5 MG/0.5ML ~~LOC~~ SOAJ: 0.5000 mg | SUBCUTANEOUS | 2 refills | Status: DC

## 2021-07-08 NOTE — Telephone Encounter (Signed)
Pt states the [facility] 0.'25mg'$  dosage was not very effective for her (used sample). Pt states she used Ozempic, however, was effective, but it's not covered by insurance. Pt is requesting increase dosage of Wegovy.  ?

## 2021-07-08 NOTE — Telephone Encounter (Signed)
Pt notified of PCP response. States her last dose of Wegovy was 07/06/21; she will take the 0.'5mg'$  next Weds. Pt verb understanding. ?

## 2021-07-08 NOTE — Telephone Encounter (Signed)
Wegovy 0.'5mg'$  rx sent in. If she has been off both, she will need to restart at 0.'25mg'$  dose for at least 2 weeks before increasing to 0.'5mg'$ . so glad it got approved! ?

## 2021-07-18 ENCOUNTER — Ambulatory Visit: Payer: 59

## 2021-07-22 ENCOUNTER — Ambulatory Visit (INDEPENDENT_AMBULATORY_CARE_PROVIDER_SITE_OTHER): Payer: 59

## 2021-07-22 DIAGNOSIS — J4550 Severe persistent asthma, uncomplicated: Secondary | ICD-10-CM | POA: Diagnosis not present

## 2021-08-03 ENCOUNTER — Ambulatory Visit: Payer: 59 | Admitting: Family Medicine

## 2021-08-09 ENCOUNTER — Other Ambulatory Visit: Payer: Self-pay | Admitting: *Deleted

## 2021-08-09 ENCOUNTER — Other Ambulatory Visit: Payer: Self-pay | Admitting: Family Medicine

## 2021-08-09 DIAGNOSIS — E785 Hyperlipidemia, unspecified: Secondary | ICD-10-CM

## 2021-08-12 MED ORDER — CLONAZEPAM 0.5 MG PO TABS
0.5000 mg | ORAL_TABLET | Freq: Every day | ORAL | 2 refills | Status: DC | PRN
Start: 2021-08-12 — End: 2021-08-17

## 2021-08-16 ENCOUNTER — Telehealth: Payer: Self-pay | Admitting: *Deleted

## 2021-08-16 NOTE — Telephone Encounter (Signed)
[facility] faxed a refill request for a 90-day supply of Clonazepam 0.'5mg'$ .  Message sent to PCP. ?

## 2021-08-17 ENCOUNTER — Other Ambulatory Visit: Payer: Self-pay | Admitting: Family Medicine

## 2021-08-17 ENCOUNTER — Telehealth: Payer: Self-pay | Admitting: *Deleted

## 2021-08-17 MED ORDER — CLONAZEPAM 0.5 MG PO TABS: 0.5000 mg | ORAL_TABLET | Freq: Every day | ORAL | 1 refills | Status: DC | PRN

## 2021-08-17 MED ORDER — TEZSPIRE 210 MG/1.91ML ~~LOC~~ SOAJ: 210.0000 mg | SUBCUTANEOUS | 11 refills | Status: DC

## 2021-08-17 NOTE — Addendum Note (Signed)
Addended by: Carin Hock on: 08/17/2021 09:04 AM ? ? Modules accepted: Orders ? ?

## 2021-08-17 NOTE — Telephone Encounter (Signed)
Refill sent.

## 2021-08-17 NOTE — Telephone Encounter (Signed)
Called patient and advised approval for Tezspire autoinjector and will send [place] to [facility] for the change and they will reach out to patient to schedule shipment ?

## 2021-08-19 ENCOUNTER — Ambulatory Visit (INDEPENDENT_AMBULATORY_CARE_PROVIDER_SITE_OTHER): Payer: 59

## 2021-08-19 DIAGNOSIS — J4550 Severe persistent asthma, uncomplicated: Secondary | ICD-10-CM | POA: Diagnosis not present

## 2021-08-22 ENCOUNTER — Telehealth: Payer: Self-pay | Admitting: Family Medicine

## 2021-08-22 ENCOUNTER — Encounter: Payer: Self-pay | Admitting: Family Medicine

## 2021-08-22 ENCOUNTER — Ambulatory Visit (INDEPENDENT_AMBULATORY_CARE_PROVIDER_SITE_OTHER): Payer: 59 | Admitting: Family Medicine

## 2021-08-22 VITALS — BP 110/82 | HR 103 | Temp 98.2°F | Ht 65.0 in | Wt 238.4 lb

## 2021-08-22 DIAGNOSIS — Z23 Encounter for immunization: Secondary | ICD-10-CM | POA: Diagnosis not present

## 2021-08-22 DIAGNOSIS — G43801 Other migraine, not intractable, with status migrainosus: Secondary | ICD-10-CM | POA: Diagnosis not present

## 2021-08-22 DIAGNOSIS — K219 Gastro-esophageal reflux disease without esophagitis: Secondary | ICD-10-CM

## 2021-08-22 DIAGNOSIS — J4550 Severe persistent asthma, uncomplicated: Secondary | ICD-10-CM

## 2021-08-22 DIAGNOSIS — R7301 Impaired fasting glucose: Secondary | ICD-10-CM

## 2021-08-22 DIAGNOSIS — I10 Essential (primary) hypertension: Secondary | ICD-10-CM

## 2021-08-22 DIAGNOSIS — E785 Hyperlipidemia, unspecified: Secondary | ICD-10-CM

## 2021-08-22 LAB — CBC WITH DIFFERENTIAL/PLATELET
Basophils Absolute: 0.1 10*3/uL (ref 0.0–0.1)
Basophils Relative: 1.3 % (ref 0.0–3.0)
Eosinophils Absolute: 0.1 10*3/uL (ref 0.0–0.7)
Eosinophils Relative: 1.6 % (ref 0.0–5.0)
HCT: 42.9 % (ref 36.0–46.0)
Hemoglobin: 14.3 g/dL (ref 12.0–15.0)
Lymphocytes Relative: 24.1 % (ref 12.0–46.0)
Lymphs Abs: 1.9 10*3/uL (ref 0.7–4.0)
MCHC: 33.3 g/dL (ref 30.0–36.0)
MCV: 92 fl (ref 78.0–100.0)
Monocytes Absolute: 0.5 10*3/uL (ref 0.1–1.0)
Monocytes Relative: 6.8 % (ref 3.0–12.0)
Neutro Abs: 5.3 10*3/uL (ref 1.4–7.7)
Neutrophils Relative %: 66.2 % (ref 43.0–77.0)
Platelets: 355 10*3/uL (ref 150.0–400.0)
RBC: 4.67 Mil/uL (ref 3.87–5.11)
RDW: 13.1 % (ref 11.5–15.5)
WBC: 8 10*3/uL (ref 4.0–10.5)

## 2021-08-22 LAB — COMPREHENSIVE METABOLIC PANEL
ALT: 17 U/L (ref 0–35)
AST: 18 U/L (ref 0–37)
Albumin: 4.5 g/dL (ref 3.5–5.2)
Alkaline Phosphatase: 67 U/L (ref 39–117)
BUN: 13 mg/dL (ref 6–23)
CO2: 29 mEq/L (ref 19–32)
Calcium: 9.7 mg/dL (ref 8.4–10.5)
Chloride: 104 mEq/L (ref 96–112)
Creatinine, Ser: 1.09 mg/dL (ref 0.40–1.20)
GFR: 57.97 mL/min — ABNORMAL LOW (ref >60.00)
Glucose, Bld: 98 mg/dL (ref 70–99)
Potassium: 4.7 mEq/L (ref 3.5–5.1)
Sodium: 140 mEq/L (ref 135–145)
Total Bilirubin: 0.4 mg/dL (ref 0.2–1.2)
Total Protein: 7.7 g/dL (ref 6.0–8.3)

## 2021-08-22 LAB — LIPID PANEL
Cholesterol: 195 mg/dL (ref 0–200)
HDL: 63.8 mg/dL (ref >39.00)
LDL Cholesterol: 109 mg/dL — ABNORMAL HIGH (ref 0–99)
NonHDL: 131.15
Total CHOL/HDL Ratio: 3
Triglycerides: 113 mg/dL (ref 0.0–149.0)
VLDL: 22.6 mg/dL (ref 0.0–40.0)

## 2021-08-22 LAB — HEMOGLOBIN A1C: Hgb A1c MFr Bld: 5.4 % (ref 4.6–6.5)

## 2021-08-22 NOTE — Telephone Encounter (Signed)
Patient states that Dr. Ethlyn Gallery recommended her and her husband/son to see Dr. Martinique.  Can she and her family become new patients of Dr. Martinique? ?

## 2021-08-22 NOTE — Progress Notes (Signed)
?Alyssa Clay ?DOB: 08-Sep-1965 ?Encounter date: 08/22/2021 ? ?This is a 56 y.o. female who presents with ?Chief Complaint  ?Patient presents with  ? Follow-up  ? ? ?History of present illness: ? ?Last visit with me was 06/01/2021.  We talked about anxiety at that time.  She was under a lot of stress with her mom being recently diagnosed with lung cancer.  She was given Klonopin to have for more emergency use for anxiety.  She was encouraged to follow-up with her counselor. She did visit over Easter and did well with that. Rarely has used the klonopin. She is doing honeymoon to [place] next week which she is excited about.  ? ?*Migraines: (migraine doc has left practice). She is seeing Margo Common. She has been getting trigger shots in head and neck. Would like to continue with this and would like to see someone that will continue to work on improving symptoms/control. She has been on the [place] fora couple of months now. Feels like this and trigger point combined have helped. She is also eating better, losing weight.  ? ?She is back working out regularly after being out for several months with back issue. She is having weight loss now. The wegovy is helping her. She is tryingto eat healthy but also not depriving self.  ? ?Asthma: Breathing has been doing ok. Follows with pulm.  ? ? ?Neurosurgeon no longer practicing.  ? ?Hypertension: diet controlled. Helped with 20lb weight loss. ? ?Reflux: protonix just before bedtime. Controls sx. She tried to go without for a few days, but then sx restarted.  ? ?Mood overall is good. She does not want to decrease meds(wellbutrin, cymbalta) ? ? ?No Known Allergies ?Current Meds  ?Medication Sig  ? albuterol (PROVENTIL) (2.5 MG/3ML) 0.083% nebulizer solution Take 3 mLs (2.5 mg total) by nebulization every 4 (four) hours as needed for wheezing or shortness of breath (coughing fits).  ? albuterol (VENTOLIN HFA) 108 (90 Base) MCG/ACT inhaler Inhale 2 puffs into the lungs every 4  (four) hours as needed for wheezing or shortness of breath (coughing fits).  ? albuterol (VENTOLIN HFA) 108 (90 Base) MCG/ACT inhaler Inhale 2 puffs into the lungs every 6 (six) hours as needed for wheezing or shortness of breath.  ? Atogepant (QULIPTA PO) Take by mouth.  ? budesonide (PULMICORT) 0.5 MG/2ML nebulizer solution TAKE 2 MLS (0.5 MG TOTAL) BY NEBULIZATION IN THE MORNING AND AT BEDTIME. TAKE DURING UPPER RESPIRATORY INFECTIONS FOR 1-2 WEEKS AT A TIME.  ? budesonide-formoterol (SYMBICORT) 160-4.5 MCG/ACT inhaler Inhale 2 puffs into the lungs in the morning and at bedtime. with spacer and rinse mouth afterwards.  ? buPROPion (WELLBUTRIN XL) 150 MG 24 hr tablet TAKE 1 TABLET BY MOUTH DAILY  ? clonazePAM (KLONOPIN) 0.5 MG tablet Take 1 tablet (0.5 mg total) by mouth daily as needed for anxiety.  ? cyclobenzaprine (FLEXERIL) 10 MG tablet Take 10 mg by mouth 3 (three) times daily as needed (migraines).  ? DULoxetine (CYMBALTA) 60 MG capsule TAKE 1 CAPSULE BY MOUTH  DAILY  ? Estradiol (YUVAFEM) 10 MCG TABS vaginal tablet Place 1 tablet (10 mcg total) vaginally daily.  ? gabapentin (NEURONTIN) 300 MG capsule Take 300-900 mg by mouth See admin instructions. Take 1 tablet every AM, three tablets at bedtime  ? montelukast (SINGULAIR) 10 MG tablet Take 1 tablet (10 mg total) by mouth daily.  ? Multiple Vitamin (MULTIVITAMIN WITH MINERALS) TABS tablet Take 1 tablet by mouth daily.  ? pantoprazole (PROTONIX) 40 MG  tablet TAKE 1 TABLET BY MOUTH  TWICE DAILY BEFORE MEALS  ? Prochlorperazine Maleate (COMPAZINE PO) Take by mouth as needed.  ? Respiratory Therapy Supplies (FLUTTER) DEVI Use 3 times daily as directed  ? rizatriptan (MAXALT) 10 MG tablet TAKE 1 TABLET BY MOUTH ONCE AS NEEDED FOR MIGRAINE FOR UP TO 1 DOSE. MAY REPEAT IN 2 HOURS IF NEEDED  ? rosuvastatin (CRESTOR) 5 MG tablet TAKE 1 TABLET BY MOUTH AT  BEDTIME  ? Semaglutide-Weight Management (WEGOVY) 0.5 MG/0.5ML SOAJ Inject 0.5 mg into the skin once a week.   ? Tezepelumab-ekko (TEZSPIRE) 210 MG/1.91ML SOAJ Inject 210 mg into the skin every 28 (twenty-eight) days.  ? traZODone (DESYREL) 100 MG tablet TAKE 1 TO 2 TABLETS BY  MOUTH AT BEDTIME AS NEEDED  FOR SLEEP  ? Ubrogepant (UBRELVY PO) Take by mouth as needed.  ? valACYclovir (VALTREX) 1000 MG tablet Take 1 tablet (1,000 mg total) by mouth 2 (two) times daily. (Patient taking differently: Take 1,000 mg by mouth as needed.)  ? ?Current Facility-Administered Medications for the 08/22/21 encounter (Office Visit) with Caren Macadam, MD  ?Medication  ? 0.9 %  sodium chloride infusion  ? tezepelumab-ekko (TEZSPIRE) 210 MG/1.91ML syringe 210 mg  ? ? ?Review of Systems  ?Constitutional:  Negative for chills, fatigue and fever.  ?Respiratory:  Negative for cough, chest tightness, shortness of breath and wheezing.   ?Cardiovascular:  Negative for chest pain, palpitations and leg swelling.  ? ?Objective: ? ?BP 110/82 (BP Location: Left Arm, Patient Position: Sitting, Cuff Size: Large)   Pulse (!) 103   Temp 98.2 ?F (36.8 ?C) (Oral)   Ht '5\' 5"'$  (1.651 m)   Wt 238 lb 6.4 oz (108.1 kg)   LMP 12/20/2018   SpO2 99%   BMI 39.67 kg/m?   Weight: 238 lb 6.4 oz (108.1 kg)  ? ?BP Readings from Last 3 Encounters:  ?08/22/21 110/82  ?07/04/21 122/72  ?06/01/21 130/75  ? ?Wt Readings from Last 3 Encounters:  ?08/22/21 238 lb 6.4 oz (108.1 kg)  ?05/04/21 259 lb 3.2 oz (117.6 kg)  ?04/05/21 256 lb (116.1 kg)  ? ? ?Physical Exam ?Constitutional:   ?   General: She is not in acute distress. ?   Appearance: She is well-developed.  ?Cardiovascular:  ?   Rate and Rhythm: Normal rate and regular rhythm.  ?   Heart sounds: Normal heart sounds. No murmur heard. ?  No friction rub.  ?Pulmonary:  ?   Effort: Pulmonary effort is normal. No respiratory distress.  ?   Breath sounds: Normal breath sounds. No wheezing or rales.  ?Musculoskeletal:  ?   Right lower leg: No edema.  ?   Left lower leg: No edema.  ?Neurological:  ?   Mental Status: She  is alert and oriented to person, place, and time.  ?Psychiatric:     ?   Mood and Affect: Mood normal.     ?   Behavior: Behavior normal.     ?   Thought Content: Thought content normal.     ?   Judgment: Judgment normal.  ? ? ?Assessment/Plan ? ?1. Primary hypertension ?Blood pressure well controlled without medication.  She is working towards healthy lifestyle behaviors and motivated to make healthy lifestyle changes including weight loss to benefit her health. ?- CBC with Differential/Platelet; Future ?- Comprehensive metabolic panel; Future ?- Comprehensive metabolic panel ?- CBC with Differential/Platelet ? ?2. Severe persistent asthma, unspecified whether complicated ?Breathing has been stable.  She  does follow with pulmonology. ? ?3. Other migraine with status migrainosus, not intractable ?Migraines currently are doing better.  I have referred her today to the headache clinic to have follow-up as her previous provider left. ?- AMB referral to headache clinic ? ?4. Impaired fasting glucose ?Controlled with diet and exercise.  Continue to keep up the good work with regular activity level and healthy eating. ?- Hemoglobin A1c; Future ?- Hemoglobin A1c ? ?5. Gastroesophageal reflux disease, unspecified whether esophagitis present ?She has been able to cut back on the Protonix to 40 mg daily, but was unable to stop taking entirely due to recurrence of reflux symptoms.  I advised trying to break 40 mg tablet in half once daily to see if she tolerated this.  We did discuss that she continues to lose weight and work on healthier eating that reflux symptoms should also improve. ? ?6. Hyperlipidemia, unspecified hyperlipidemia type ?Crestor 5 mg daily. ?- Lipid panel; Future ?- Lipid panel ? ?7. Need for shingles vaccine ? ?- Varicella-zoster vaccine IM ? ? ?Return in about 6 months (around 02/22/2022) for physical exam. ? ? ? ? ? ?Micheline Rough, MD ?

## 2021-08-23 ENCOUNTER — Encounter: Payer: Self-pay | Admitting: Family Medicine

## 2021-08-23 NOTE — Telephone Encounter (Signed)
It is fine with me. Just let pt know I can continue prescribing her clonazepam but 30 days supply at the time with refills,[it seems like she gets 3 months supply at the time]. ?Thanks, ?BJ ?

## 2021-08-23 NOTE — Telephone Encounter (Signed)
Okay to schedule

## 2021-08-25 ENCOUNTER — Other Ambulatory Visit: Payer: Self-pay | Admitting: Family Medicine

## 2021-08-25 MED ORDER — SCOPOLAMINE 1 MG/3DAYS TD PT72: 1.0000 | MEDICATED_PATCH | TRANSDERMAL | 0 refills | Status: DC

## 2021-08-25 NOTE — Telephone Encounter (Signed)
Spoke to patient--made her aware about the 30 day supply of Cloanazepam.  She is ok with that.  She is going to call back and set up an appt for her and her husband/son for a TOC. ?

## 2021-08-25 NOTE — Telephone Encounter (Signed)
I forgot to ask you yesterday about taking something for motion sickness for our cruise next week.  It?s our first time cruising so I?m a little worried about the motion sickness.    ?  ?Also , I saw my test results.  I wonder if it?s because I did not fast or I have possibly been taking too many stool softeners or laxatives since I feel like I?m constipated from wegovy.  I started using miralax every day recently which I think is better.  Let me know what I can do to make this better. I hope I can still stay on wegovy    ?  ?Thank you!!!  ?  ?Alyssa Clay  ?

## 2021-08-26 ENCOUNTER — Encounter: Payer: Self-pay | Admitting: Family Medicine

## 2021-08-29 MED ORDER — WEGOVY 1 MG/0.5ML ~~LOC~~ SOAJ
1.0000 mg | SUBCUTANEOUS | 5 refills | Status: DC
Start: 2021-08-29 — End: 2021-10-19

## 2021-09-12 ENCOUNTER — Ambulatory Visit (INDEPENDENT_AMBULATORY_CARE_PROVIDER_SITE_OTHER): Payer: 59 | Admitting: Family Medicine

## 2021-09-12 ENCOUNTER — Encounter: Payer: Self-pay | Admitting: Family Medicine

## 2021-09-12 VITALS — Ht 65.5 in | Wt 245.0 lb

## 2021-09-12 DIAGNOSIS — K219 Gastro-esophageal reflux disease without esophagitis: Secondary | ICD-10-CM

## 2021-09-12 DIAGNOSIS — Z8616 Personal history of COVID-19: Secondary | ICD-10-CM | POA: Diagnosis not present

## 2021-09-12 DIAGNOSIS — J310 Chronic rhinitis: Secondary | ICD-10-CM | POA: Diagnosis not present

## 2021-09-12 DIAGNOSIS — Z8709 Personal history of other diseases of the respiratory system: Secondary | ICD-10-CM

## 2021-09-12 DIAGNOSIS — J4550 Severe persistent asthma, uncomplicated: Secondary | ICD-10-CM

## 2021-09-12 MED ORDER — PREDNISONE 10 MG PO TABS: ORAL_TABLET | ORAL | 0 refills | Status: DC

## 2021-09-12 NOTE — Progress Notes (Signed)
RE: Alyssa Clay MRN: [id number] DOB: Aug 16, 1965 Date of Telemedicine Visit: 09/12/2021  Referring provider: Caren Macadam, MD Primary care provider: Caren Macadam, MD  Chief Complaint: Cough (Barking cough during covid. Testing positive Last Tuesday on a cruise. )   Telemedicine Follow Up Visit via Telephone: I connected with Alyssa Clay for a follow up on 09/12/21 by telephone and verified that I am speaking with the correct person using two identifiers.   I discussed the limitations, risks, security and privacy concerns of performing an evaluation and management service by telephone and the availability of in person appointments. I also discussed with the patient that there may be a patient responsible charge related to this service. The patient expressed understanding and agreed to proceed.  Patient is at home  Provider is at the office.  Visit start time: 6629 Visit end time: 65 Insurance consent/check in by: [facility] consent and medical assistant/nurse: Cree  History of Present Illness: She is a 56 y.o. female, who is being followed for severe asthma, history of atelectasis, frequent upper respiratory infections, nonallergic rhinitis, and reflux. Her previous allergy office visit was on 07/04/2021 with Dr. Maudie Mercury.  In the interim, she reports that she began to develop symptoms while she was on vacation and subsequently tested positive for COVID.  She was given a burst of prednisone and Levaquin which she began on Tuesday.  At today's visit, she reports that she continues to experience symptoms of asthma including shortness of breath with coughing only, rattling wheeze, and cough producing thick yellow mucus which is improving day by day.  She returned from vacation on Saturday night and began using albuterol and budesonide via nebulizer yesterday as well as albuterol approximately every 4 hours beginning yesterday.  She did stop using Symbicort 160 several days ago  as she was unsure about any drug interactions.  She continues to receive Tezspire injections with her last injection noted to be on 08/13/2021.  She does report some mild relief after taking Levaquin.  Allergic rhinitis is reported as poorly controlled with symptoms including thin clear drainage, slight nasal congestion, and moderate postnasal drainage.  She is not currently using an antihistamine, nasal saline, or steroid nasal sprays.  She has been intermittently taking Mucinex D with some relief of symptoms.  Reflux is reported as well controlled with pantoprazole, however, she reports that she takes this medication right before going to bed.  Her current medications are listed in the chart.  Assessment and Plan: Alyssa Clay is a 56 y.o. female with: Patient Instructions  Asthma Begin using albuterol via nebulizer once every morning. After 15 minutes use budesonide 1 vial via nebulizer. Then use Symbicort 160-2 puffs with a spacer.  Begin a short prednisone burst for relief of asthma flare. Prednisone 10 mg tablets take 2 tablets once a day for the next 4 days, then take 1 tablet on the 5th day, then stop Continue albuterol 2 puffs every 4 hours as needed for cough or wheeze OR Instead use albuterol 0.083% solution via nebulizer one unit vial every 4 hours as needed for cough or wheeze Continue montelukast 10 mg once a day to prevent cough and wheeze Continue to receive Tezspire injections once every 28 days Call the clinic if your symptoms worsen, do not improve, or if you develop a fever and we will move forward with a chest xray  Non-allergic rhinitis Continue a nasal saline rinse at least once a day Continue Flonase 2 sprays in each  nostril once a day as needed for a stuffy nose.  In the right nostril, point the applicator out toward the right ear. In the left nostril, point the applicator out toward the left ear If needed, after 5 days, you may continue an over the counter antihistamine once a day  if needed for a runny nose or itch Begin prednisone as listed above Begin Mucinex (guaifenesin) [phone] mg twice a day and increase fluid intake as tolerated to thin out mucus  Reflux Continue dietary and lifestyle modifications as listed below Continue pantoprazole as previously prescribed. Take this medication about 1 hour before eating   Recurrent infection Keep track of infections and antibiotic use  Call the clinic if this treatment plan is not working well for you  Follow up in the clinic in 2 weeks or sooner if needed.   Return in about 2 weeks (around 09/26/2021), or if symptoms worsen or fail to improve.  Meds ordered this encounter  Medications   predniSONE (DELTASONE) 10 MG tablet    Sig: Prednisone 10 mg tablets. Take 2 tablets once a day for 4 days, then take 1 tablet on the 5th day, then stop.    Dispense:  9 tablet    Refill:  0    Medication List:  Current Outpatient Medications  Medication Sig Dispense Refill   albuterol (PROVENTIL) (2.5 MG/3ML) 0.083% nebulizer solution Take 3 mLs (2.5 mg total) by nebulization every 4 (four) hours as needed for wheezing or shortness of breath (coughing fits). 75 mL 2   albuterol (VENTOLIN HFA) 108 (90 Base) MCG/ACT inhaler Inhale 2 puffs into the lungs every 4 (four) hours as needed for wheezing or shortness of breath (coughing fits). 18 g 1   Atogepant (QULIPTA PO) Take by mouth.     budesonide (PULMICORT) 0.5 MG/2ML nebulizer solution TAKE 2 MLS (0.5 MG TOTAL) BY NEBULIZATION IN THE MORNING AND AT BEDTIME. TAKE DURING UPPER RESPIRATORY INFECTIONS FOR 1-2 WEEKS AT A TIME. 360 mL 0   budesonide-formoterol (SYMBICORT) 160-4.5 MCG/ACT inhaler Inhale 2 puffs into the lungs in the morning and at bedtime. with spacer and rinse mouth afterwards. 3 each 3   buPROPion (WELLBUTRIN XL) 150 MG 24 hr tablet TAKE 1 TABLET BY MOUTH DAILY 90 tablet 3   clonazePAM (KLONOPIN) 0.5 MG tablet Take 1 tablet (0.5 mg total) by mouth daily as needed for  anxiety. 90 tablet 1   cyclobenzaprine (FLEXERIL) 10 MG tablet Take 10 mg by mouth 3 (three) times daily as needed (migraines).     DULoxetine (CYMBALTA) 60 MG capsule TAKE 1 CAPSULE BY MOUTH  DAILY 90 capsule 3   Estradiol (YUVAFEM) 10 MCG TABS vaginal tablet Place 1 tablet (10 mcg total) vaginally daily. 8 tablet 0   gabapentin (NEURONTIN) 300 MG capsule Take 300-900 mg by mouth See admin instructions. Take 1 tablet every AM, three tablets at bedtime     montelukast (SINGULAIR) 10 MG tablet Take 1 tablet (10 mg total) by mouth daily. 90 tablet 3   Multiple Vitamin (MULTIVITAMIN WITH MINERALS) TABS tablet Take 1 tablet by mouth daily.     pantoprazole (PROTONIX) 40 MG tablet TAKE 1 TABLET BY MOUTH  TWICE DAILY BEFORE MEALS 180 tablet 3   predniSONE (DELTASONE) 10 MG tablet Prednisone 10 mg tablets. Take 2 tablets once a day for 4 days, then take 1 tablet on the 5th day, then stop. 9 tablet 0   Prochlorperazine Maleate (COMPAZINE PO) Take by mouth as needed.  Respiratory Therapy Supplies (FLUTTER) DEVI Use 3 times daily as directed 1 each 0   rizatriptan (MAXALT) 10 MG tablet TAKE 1 TABLET BY MOUTH ONCE AS NEEDED FOR MIGRAINE FOR UP TO 1 DOSE. MAY REPEAT IN 2 HOURS IF NEEDED     rosuvastatin (CRESTOR) 5 MG tablet TAKE 1 TABLET BY MOUTH AT  BEDTIME 90 tablet 3   scopolamine (TRANSDERM-SCOP) 1 MG/3DAYS Place 1 patch (1.5 mg total) onto the skin every 3 (three) days. 10 patch 0   Semaglutide-Weight Management (WEGOVY) 1 MG/0.5ML SOAJ Inject 1 mg into the skin once a week. 2 mL 5   Tezepelumab-ekko (TEZSPIRE) 210 MG/1.91ML SOAJ Inject 210 mg into the skin every 28 (twenty-eight) days. 1.91 mL 11   traZODone (DESYREL) 100 MG tablet TAKE 1 TO 2 TABLETS BY  MOUTH AT BEDTIME AS NEEDED  FOR SLEEP 180 tablet 3   tretinoin (RETIN-A) 0.05 % cream Apply topically.     Ubrogepant (UBRELVY PO) Take by mouth as needed.     valACYclovir (VALTREX) 1000 MG tablet Take 1 tablet (1,000 mg total) by mouth 2 (two)  times daily. (Patient taking differently: Take 1,000 mg by mouth as needed.) 180 tablet 1   Current Facility-Administered Medications  Medication Dose Route Frequency Provider Last Rate Last Admin   0.9 %  sodium chloride infusion  500 mL Intravenous Once Thornton Park, MD       tezepelumab-ekko (TEZSPIRE) 210 MG/1.91ML syringe 210 mg  210 mg Subcutaneous Q28 days Garnet Sierras, DO   210 mg at 08/19/21 1138   Allergies: No Known Allergies I reviewed her past medical history, social history, family history, and environmental history and no significant changes have been reported from previous visit on 07/04/2021.  Review of Systems  Constitutional:  Negative for fever.  HENT:  Positive for congestion, postnasal drip and rhinorrhea.   Eyes: Negative.   Respiratory:  Positive for cough and wheezing.   Musculoskeletal: Negative.   Allergic/Immunologic: Negative for environmental allergies.  Neurological: Negative.   Psychiatric/Behavioral: Negative.     Objective: Physical Exam Not obtained as encounter was done via telephone.   Previous notes and tests were reviewed.  I discussed the assessment and treatment plan with the patient. The patient was provided an opportunity to ask questions and all were answered. The patient agreed with the plan and demonstrated an understanding of the instructions.   The patient was advised to call back or seek an in-person evaluation if the symptoms worsen or if the condition fails to improve as anticipated.  I provided 25 minutes of non-face-to-face time during this encounter.  It was my pleasure to participate in [place] care today. Please feel free to contact me with any questions or concerns.   Sincerely,  Gareth Morgan, FNP

## 2021-09-12 NOTE — Patient Instructions (Addendum)
Asthma Begin using albuterol via nebulizer once every morning. After 15 minutes use budesonide 1 vial via nebulizer. Then use Symbicort 160-2 puffs with a spacer.  Begin a short prednisone burst for relief of asthma flare. Prednisone 10 mg tablets take 2 tablets once a day for the next 4 days, then take 1 tablet on the 5th day, then stop Continue albuterol 2 puffs every 4 hours as needed for cough or wheeze OR Instead use albuterol 0.083% solution via nebulizer one unit vial every 4 hours as needed for cough or wheeze Continue montelukast 10 mg once a day to prevent cough and wheeze Continue to receive Tezspire injections once every 28 days Call the clinic if your symptoms worsen, do not improve, or if you develop a fever and we will move forward with a chest xray  Non-allergic rhinitis Continue a nasal saline rinse at least once a day Continue Flonase 2 sprays in each nostril once a day as needed for a stuffy nose.  In the right nostril, point the applicator out toward the right ear. In the left nostril, point the applicator out toward the left ear If needed, after 5 days, you may continue an over the counter antihistamine once a day if needed for a runny nose or itch Begin prednisone as listed above Begin Mucinex (guaifenesin) [phone] mg twice a day and increase fluid intake as tolerated to thin out mucus  Reflux Continue dietary and lifestyle modifications as listed below Continue pantoprazole as previously prescribed. Take this medication about 1 hour before eating  Recurrent infection Keep track of infections and antibiotic use  Call the clinic if this treatment plan is not working well for you  Follow up in the clinic in 2 weeks or sooner if needed.   Lifestyle Changes for Controlling GERD When you have GERD, stomach acid feels as if it's backing up toward your mouth. Whether or not you take medication to control your GERD, your symptoms can often be improved with lifestyle  changes.   Raise Your Head Reflux is more likely to strike when you're lying down flat, because stomach fluid can flow backward more easily. Raising the head of your bed 4-6 inches can help. To do this: Slide blocks or books under the legs at the head of your bed. Or, place a wedge under the mattress. Many foam stores can make a suitable wedge for you. The wedge should run from your waist to the top of your head. Don't just prop your head on several pillows. This increases pressure on your stomach. It can make GERD worse.  Watch Your Eating Habits Certain foods may increase the acid in your stomach or relax the lower esophageal sphincter, making GERD more likely. It's best to avoid the following: Coffee, tea, and carbonated drinks (with and without caffeine) Fatty, fried, or spicy food Mint, chocolate, onions, and tomatoes Any other foods that seem to irritate your stomach or cause you pain  Relieve the Pressure Eat smaller meals, even if you have to eat more often. Don't lie down right after you eat. Wait a few hours for your stomach to empty. Avoid tight belts and tight-fitting clothes. Lose excess weight.  Tobacco and Alcohol Avoid smoking tobacco and drinking alcohol. They can make GERD symptoms worse.

## 2021-09-14 ENCOUNTER — Encounter (HOSPITAL_BASED_OUTPATIENT_CLINIC_OR_DEPARTMENT_OTHER): Payer: Self-pay | Admitting: Emergency Medicine

## 2021-09-14 ENCOUNTER — Emergency Department (HOSPITAL_BASED_OUTPATIENT_CLINIC_OR_DEPARTMENT_OTHER)
Admission: EM | Admit: 2021-09-14 | Discharge: 2021-09-14 | Disposition: A | Discharge status: Home / Self Care | Payer: 59 | Attending: Emergency Medicine | Admitting: Emergency Medicine

## 2021-09-14 ENCOUNTER — Other Ambulatory Visit: Payer: Self-pay

## 2021-09-14 ENCOUNTER — Emergency Department (HOSPITAL_BASED_OUTPATIENT_CLINIC_OR_DEPARTMENT_OTHER): Payer: 59

## 2021-09-14 DIAGNOSIS — I10 Essential (primary) hypertension: Secondary | ICD-10-CM | POA: Diagnosis not present

## 2021-09-14 DIAGNOSIS — J40 Bronchitis, not specified as acute or chronic: Secondary | ICD-10-CM | POA: Diagnosis not present

## 2021-09-14 DIAGNOSIS — R0602 Shortness of breath: Secondary | ICD-10-CM | POA: Diagnosis present

## 2021-09-14 DIAGNOSIS — Z8616 Personal history of COVID-19: Secondary | ICD-10-CM | POA: Diagnosis not present

## 2021-09-14 IMAGING — DX DG CHEST 1V PORT
1 series · 1 of 1 positions shown · non-contrast
Comparison: [DATE]

CLINICAL DATA: Shortness of breath and cough.

EXAM:
PORTABLE CHEST 1 VIEW

[chest ap]
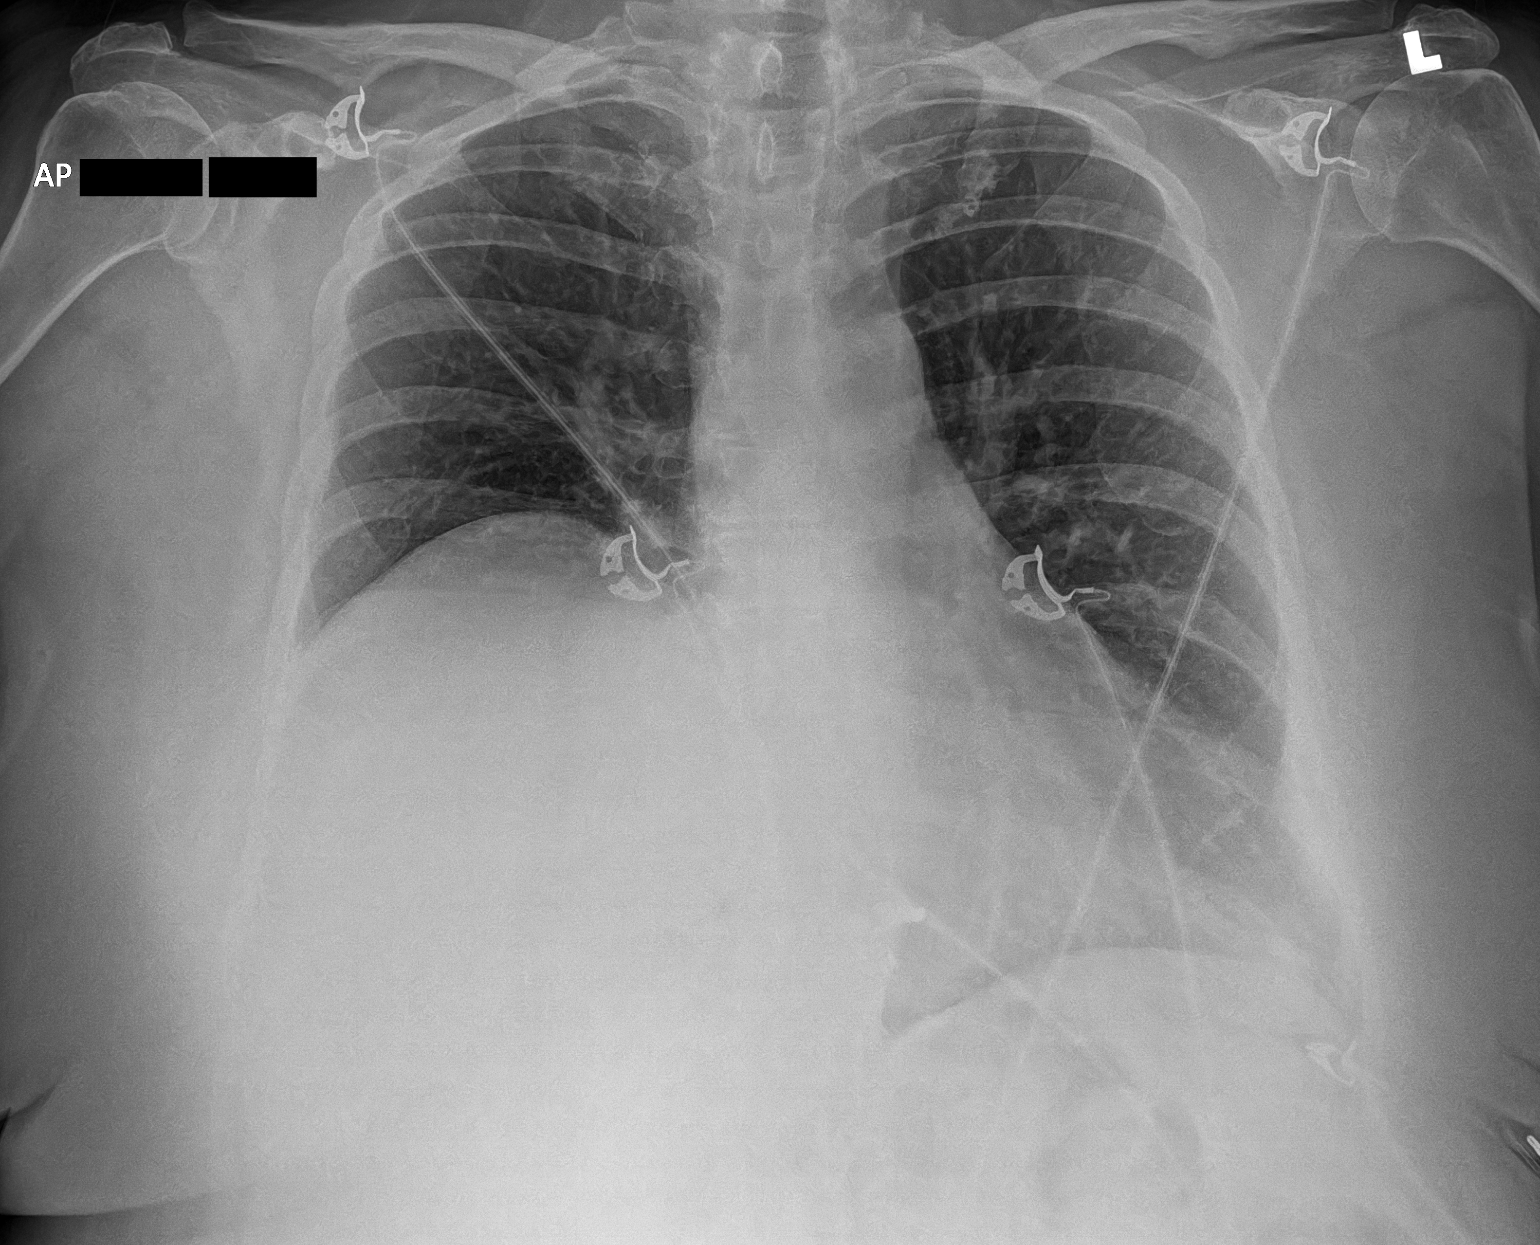

[1 of 1 positions shown; findings below may reference images not displayed]

FINDINGS: [TK] hours. Stable marked asymmetric elevation right hemidiaphragm.
The lungs are clear without focal pneumonia, edema, pneumothorax or
pleural effusion. The cardio pericardial silhouette is enlarged. The
visualized bony structures of the thorax are unremarkable. Telemetry
leads overlie the chest.
IMPRESSION: No active disease.

## 2021-09-14 MED ORDER — GUAIFENESIN-DM 100-10 MG/5ML PO SYRP
5.0000 mL | ORAL_SOLUTION | Freq: Once | ORAL | Status: DC
  Filled 2021-09-14: qty 5

## 2021-09-14 MED ORDER — METHYLPREDNISOLONE SODIUM SUCC 125 MG IJ SOLR
125.0000 mg | Freq: Once | INTRAMUSCULAR | Status: AC
  Administered 2021-09-14: 125 mg via INTRAVENOUS
  Filled 2021-09-14: qty 2

## 2021-09-14 MED ORDER — GUAIFENESIN-CODEINE 100-10 MG/5ML PO SOLN: 5.0000 mL | Freq: Three times a day (TID) | ORAL | 0 refills | Status: DC | PRN

## 2021-09-14 MED ORDER — IPRATROPIUM-ALBUTEROL 0.5-2.5 (3) MG/3ML IN SOLN
3.0000 mL | RESPIRATORY_TRACT | Status: AC
  Administered 2021-09-14 (×3): 3 mL via RESPIRATORY_TRACT
  Filled 2021-09-14: qty 9

## 2021-09-14 MED ORDER — GUAIFENESIN-CODEINE 100-10 MG/5ML PO SOLN
5.0000 mL | Freq: Once | ORAL | Status: AC
  Administered 2021-09-14: 5 mL via ORAL
  Filled 2021-09-14: qty 5

## 2021-09-14 NOTE — ED Triage Notes (Signed)
Pt c/o worsening SHOB and cough since being diagnosed with covid on 5/16.

## 2021-09-14 NOTE — ED Provider Notes (Signed)
[facility] EMERGENCY DEPT Provider Note  CSN: [id number] Arrival date & time: 09/14/21 0431  Chief Complaint(s) Shortness of Breath  HPI Alyssa Clay is a 56 y.o. female with a past medical history listed below including asthma, diaphragm paralysis who was recently diagnosed with COVID-19 on May 16.  Symptoms began 2 days prior.  Patient was prescribed Levaquin and steroids.  Completed course of Levaquin this currently still on steroids.  Reports that she initially felt better but over the past several days her cough has returned and gradually worsening.  Tonight she awoke feeling extremely short of breath from the coughing spell.  She was tried taking home breathing treatments without relief.  She denies any associated chest pain.  No abdominal pain.  No fevers.  No other physical complaints.   Shortness of Breath  Past Medical History Past Medical History:  Diagnosis Date   Allergy    Anemia    Asthma    Constipation    issues x the past month -? 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   Enlarged ovary 09/15/2020   noted on ct scan, per pt   FUO (fever of unknown origin) 10/21/2019   GERD (gastroesophageal reflux disease)    Hypertension    PAST hx    Migraine    Patient Active Problem List   Diagnosis Date Noted   History of COVID-19 09/12/2021   Lumbar radiculopathy, right 10/08/2020   SI (sacroiliac) joint dysfunction 09/16/2020   Somatic dysfunction of spine, sacral 09/16/2020   OSA (obstructive sleep apnea) 12/18/2019   FUO (fever of unknown origin) 10/21/2019   S/P bilateral breast reduction 06/19/2019   Back pain 04/15/2019   Neck pain 04/15/2019   History of frequent upper respiratory infection 01/16/2019   Atelectasis 01/16/2019   Severe persistent asthma without complication 93/81/0175   Shortness of breath 01/01/2019   Primary osteoarthritis of both knees  07/10/2018   Hypertension 03/27/2018   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 Authorizing Provider  guaiFENesin-codeine 100-10 MG/5ML syrup Take 5-10 mLs by mouth 3 (three) times daily as needed for cough. 09/14/21  Yes Lemoyne Nestor, Grayce Sessions, MD  albuterol (PROVENTIL) (2.5 MG/3ML) 0.083% nebulizer solution Take 3 mLs (2.5 mg total) by nebulization every 4 (four) hours as needed for wheezing or shortness of breath (coughing fits). 02/28/21   Garnet Sierras, DO  albuterol (VENTOLIN HFA) 108 (90 Base) MCG/ACT inhaler Inhale 2 puffs into the lungs every 4 (four) hours as needed for wheezing or shortness of breath (coughing fits). 07/04/21   Garnet Sierras, DO  Atogepant (QULIPTA PO) Take by mouth.    [provider]  budesonide (PULMICORT) 0.5 MG/2ML nebulizer solution TAKE 2 MLS (0.5 MG TOTAL) BY NEBULIZATION IN THE MORNING AND AT BEDTIME. TAKE DURING UPPER RESPIRATORY INFECTIONS FOR 1-2 WEEKS AT A TIME. 03/21/21   Garnet Sierras, DO  budesonide-formoterol [facility]) 160-4.5 MCG/ACT inhaler Inhale 2 puffs into the lungs in the morning and at bedtime. with spacer and rinse mouth afterwards. 07/04/21   Garnet Sierras, DO  buPROPion (WELLBUTRIN XL) 150 MG 24 hr tablet TAKE 1 TABLET BY MOUTH DAILY 08/09/21   Caren Macadam, MD  clonazePAM (KLONOPIN) 0.5 MG  tablet Take 1 tablet (0.5 mg total) by mouth daily as needed for anxiety. 08/17/21   Caren Macadam, MD  cyclobenzaprine (FLEXERIL) 10 MG tablet Take 10 mg by mouth 3 (three) times daily as needed (migraines).    [provider]  DULoxetine (CYMBALTA) 60 MG capsule TAKE 1 CAPSULE BY MOUTH  DAILY 08/09/21   Koberlein, Steele Berg, MD  Estradiol (YUVAFEM) 10 MCG TABS vaginal tablet Place 1 tablet (10 mcg total) vaginally daily. 08/30/20   Caren Macadam, MD  gabapentin (NEURONTIN) 300 MG capsule Take 300-900 mg by mouth See admin instructions. Take 1 tablet every AM, three tablets at bedtime 01/03/18   [provider]  montelukast (SINGULAIR) 10 MG tablet Take 1 tablet (10 mg total) by mouth daily. 07/04/21   Garnet Sierras, DO  Multiple Vitamin (MULTIVITAMIN WITH MINERALS) TABS tablet Take 1 tablet by mouth daily.    [provider]  pantoprazole (PROTONIX) 40 MG tablet TAKE 1 TABLET BY MOUTH  TWICE DAILY BEFORE MEALS 08/09/21   Koberlein, Steele Berg, MD  predniSONE (DELTASONE) 10 MG tablet Prednisone 10 mg tablets. Take 2 tablets once a day for 4 days, then take 1 tablet on the 5th day, then stop. 09/12/21   Ambs, Kathrine Cords, FNP  Prochlorperazine Maleate (COMPAZINE PO) Take by mouth as needed.    [provider]  Respiratory Therapy Supplies (FLUTTER) DEVI Use 3 times daily as directed 01/22/19   Mannam, Praveen, MD  rizatriptan (MAXALT) 10 MG tablet TAKE 1 TABLET BY MOUTH ONCE AS NEEDED FOR MIGRAINE FOR UP TO 1 DOSE. MAY REPEAT IN 2 HOURS IF NEEDED 08/13/20   [provider]  rosuvastatin (CRESTOR) 5 MG tablet TAKE 1 TABLET BY MOUTH AT  BEDTIME 08/09/21   Koberlein, Andris Flurry C, MD  scopolamine (TRANSDERM-SCOP) 1 MG/3DAYS Place 1 patch (1.5 mg total) onto the skin every 3 (three) days. 08/25/21   Caren Macadam, MD  Semaglutide-Weight Management (WEGOVY) 1 MG/0.5ML SOAJ Inject 1 mg into the skin once a week. 08/29/21   Koberlein, Steele Berg, MD  Tezepelumab-ekko (TEZSPIRE) 210 MG/1.91ML SOAJ Inject 210 mg into the skin every 28 (twenty-eight) days. 08/17/21   Garnet Sierras, DO  traZODone (DESYREL) 100 MG tablet TAKE 1 TO 2 TABLETS BY  MOUTH AT BEDTIME AS NEEDED  FOR SLEEP 05/10/21   Koberlein, Steele Berg, MD  tretinoin (RETIN-A) 0.05 % cream Apply topically. 03/30/20   [provider]  Ubrogepant (UBRELVY PO) Take by mouth as needed.    [provider]  valACYclovir (VALTREX) 1000 MG tablet Take 1 tablet  (1,000 mg total) by mouth 2 (two) times daily. Patient taking differently: Take 1,000 mg by mouth as needed. 07/15/20   Caren Macadam, MD  Allergies Patient has no known allergies.  Review of Systems Review of Systems  Respiratory:  Positive for shortness of breath.   As noted in HPI  Physical Exam Vital Signs  I have reviewed the triage vital signs BP (!) 146/87   Pulse 100   Temp 98.9 F (37.2 C)   Resp 19   Ht 5' 5.5" (1.664 m)   Wt 111.1 kg   LMP 12/20/2018   SpO2 93%   BMI 40.15 kg/m   Physical Exam Vitals reviewed.  Constitutional:      General: She is not in acute distress.    Appearance: She is well-developed. She is not diaphoretic.  HENT:     Head: Normocephalic and atraumatic.     Nose: Nose normal.  Eyes:     General: No scleral icterus.       Right eye: No discharge.        Left eye: No discharge.     Conjunctiva/sclera: Conjunctivae normal.     Pupils: Pupils are equal, round, and reactive to light.  Cardiovascular:     Rate and Rhythm: Normal rate and regular rhythm.     Heart sounds: No murmur heard.   No friction rub. No gallop.  Pulmonary:     Effort: Pulmonary effort is normal. Tachypnea present. No respiratory distress.     Breath sounds: No stridor. Rhonchi present. No wheezing or rales.  Abdominal:     General: There is no distension.     Palpations: Abdomen is soft.     Tenderness: There is no abdominal tenderness.  Musculoskeletal:        General: No tenderness.     Cervical back: Normal range of motion and neck supple.  Skin:    General: Skin is warm and dry.     Findings: No erythema or rash.  Neurological:     Mental Status: She is alert and oriented to person, place, and time.    ED Results and Treatments Labs (all labs ordered are listed, but only abnormal results are displayed) Labs Reviewed  - No data to display                                                                                                                       EKG  EKG Interpretation  Date/Time:  Wednesday Sep 14 2021 04:41:22 EDT Ventricular Rate:  100 PR Interval:  149 QRS Duration: 101 QT Interval:  337 QTC Calculation: 435 R Axis:   18 Text Interpretation: Sinus tachycardia Confirmed by Addison Lank [phone]) on 09/14/2021 5:45:24 AM       Radiology DG Chest Port 1 View  Result Date: 09/14/2021 CLINICAL DATA:  Shortness of breath and cough. EXAM: PORTABLE CHEST 1 VIEW COMPARISON:  01/11/2021 FINDINGS: 0554 hours. Stable marked asymmetric elevation right hemidiaphragm. The lungs are clear without focal pneumonia, edema, pneumothorax or pleural effusion. The cardio pericardial silhouette is enlarged. The visualized bony structures of the thorax are unremarkable. Telemetry leads overlie the  chest. IMPRESSION: No active disease. Electronically Signed   By: Misty Stanley M.D.   On: 09/14/2021 06:14    Pertinent labs & imaging results that were available during my care of the patient were reviewed by me and considered in my medical decision making (see MDM for details).  Medications Ordered in ED Medications  ipratropium-albuterol (DUONEB) 0.5-2.5 (3) MG/3ML nebulizer solution 3 mL (3 mLs Nebulization Given 09/14/21 0557)  methylPREDNISolone sodium succinate (SOLU-MEDROL) 125 mg/2 mL injection 125 mg (125 mg Intravenous Given 09/14/21 0524)  guaiFENesin-codeine 100-10 MG/5ML solution 5 mL (5 mLs Oral Given 09/14/21 0533)                                                                                                                                     Procedures Procedures  (including critical care time)  Medical Decision Making / ED Course    Complexity of Problem:  Co-morbidities/SDOH that complicate the patient evaluation/care: Noted above in HPI   Patient's presenting problem/concern, DDX, and  MDM listed below: Shortness of breath, Due to bronchospasm from severe coughing spells.  Recent COVID infection. We will treat for possible asthma exacerbation We will assess for pneumonia, pneumothorax. Doubt cardiac process. Patient did just return from a recent trip, but I have a low suspicion for pulmonary embolism.  If patient does not improve with initial treatment may consider expanding the work-up to include this.     Complexity of Data:   Cardiac Monitoring: The patient was maintained on a cardiac monitor.   I personally viewed and interpreted the cardiac monitored which showed an underlying rhythm of normal sinus rhythm with rates in the 90s to low 100s EKG without acute ischemic changes, dysrhythmias, or blocks.  Laboratory Tests ordered listed below with my independent interpretation: Deferring for now   Imaging Studies ordered listed below with my independent interpretation: Chest x-ray without evidence of pneumonia, pneumothorax, pulmonary edema or pleural effusions.     ED Course:    Assessment, Add'l Intervention, and Reassessment: Bronchitis from recent COVID infection Improved after treatment    Final Clinical Impression(s) / ED Diagnoses Final diagnoses:  Bronchitis   The patient appears reasonably screened and/or stabilized for discharge and I doubt any other medical condition or other [facility] requiring further screening, evaluation, or treatment in the ED at this time prior to discharge. Safe for discharge with strict return precautions.  Disposition: Discharge  Condition: Good  I have discussed the results, Dx and Tx plan with the patient/family who expressed understanding and agree(s) with the plan. Discharge instructions discussed at length. The patient/family was given strict return precautions who verbalized understanding of the instructions. No further questions at time of discharge.    ED Discharge Orders          Ordered    guaiFENesin-codeine  100-10 MG/5ML syrup  3 times daily PRN        09/14/21 7035  Follow Up: Caren Macadam, MD [place]  [id number] [phone]  Call  to schedule an appointment for close follow up           This chart was dictated using voice recognition software.  Despite best efforts to proofread,  errors can occur which can change the documentation meaning.    Fatima Blank, MD 09/14/21 [phone]

## 2021-09-15 ENCOUNTER — Telehealth: Payer: Self-pay | Admitting: Family Medicine

## 2021-09-15 ENCOUNTER — Other Ambulatory Visit: Payer: Self-pay | Admitting: Family Medicine

## 2021-09-15 NOTE — Telephone Encounter (Signed)
Can you please call this patient and find out more symptoms? Thank you

## 2021-09-15 NOTE — Telephone Encounter (Signed)
LMOM for patient to call the clinic

## 2021-09-15 NOTE — Telephone Encounter (Signed)
Patient called and said that she has a barking cough and has yellow mucus and her asthma is bad. When to ER yesterday, and she still fills bad. [phone].

## 2021-09-15 NOTE — Progress Notes (Signed)
error 

## 2021-09-16 ENCOUNTER — Telehealth: Payer: Self-pay | Admitting: Family Medicine

## 2021-09-16 MED ORDER — FAMOTIDINE 20 MG PO TABS
20.0000 mg | ORAL_TABLET | Freq: Two times a day (BID) | ORAL | 1 refills | Status: DC
Start: 2021-09-16 — End: 2021-10-10

## 2021-09-16 MED ORDER — AZITHROMYCIN 250 MG PO TABS
ORAL_TABLET | ORAL | 0 refills | Status: DC
Start: 2021-09-16 — End: 2021-09-16

## 2021-09-16 MED ORDER — FAMOTIDINE 20 MG PO TABS: 20.0000 mg | ORAL_TABLET | Freq: Two times a day (BID) | ORAL | 1 refills | Status: DC

## 2021-09-16 MED ORDER — AZITHROMYCIN 250 MG PO TABS: ORAL_TABLET | ORAL | 0 refills | Status: DC

## 2021-09-16 NOTE — Addendum Note (Signed)
Addended by: Herbie Drape on: 09/16/2021 04:08 PM   Modules accepted: Orders

## 2021-09-16 NOTE — Telephone Encounter (Signed)
Last night she had a bad asthma attack for over an hour, she is horse this am, coughing up mucus that's thick, and not feeling good, hurting all over from coughing, the er gave her cough syrup with codeine but it gives her a headache, she does have a stuffy nose, and she is doing mucinex. She feels like she has a fever but hasnt checked her temperature.

## 2021-09-16 NOTE — Telephone Encounter (Signed)
See phone call 

## 2021-09-16 NOTE — Telephone Encounter (Signed)
Patient reports that she continues to experience cough and shortness of breath. She is currently using albuterol followed by budesonide and Symbicort. She continues montelukast and is currently on a prednisone taper from the ED. chest x-ray from 09/14/2021 emergency department visit indicates, " the lungs are clear without focal pneumonia, edema, pneumothorax, or pleural effusion".  Will start azithromycin to decrease inflammation and she will begin famotidine 20 mg twice a day to decrease reflux.  She will call the clinic with worsening symptoms, fever, or if her symptoms do not improve. She will go to the ED for respiratory distress

## 2021-09-18 ENCOUNTER — Other Ambulatory Visit: Payer: Self-pay

## 2021-09-18 ENCOUNTER — Emergency Department (HOSPITAL_BASED_OUTPATIENT_CLINIC_OR_DEPARTMENT_OTHER)
Admission: EM | Admit: 2021-09-18 | Discharge: 2021-09-18 | Disposition: A | Discharge status: Home / Self Care | Payer: 59 | Attending: Emergency Medicine | Admitting: Emergency Medicine

## 2021-09-18 ENCOUNTER — Ambulatory Visit (INDEPENDENT_AMBULATORY_CARE_PROVIDER_SITE_OTHER): Payer: 59

## 2021-09-18 ENCOUNTER — Ambulatory Visit (HOSPITAL_COMMUNITY)
Admission: EM | Admit: 2021-09-18 | Discharge: 2021-09-18 | Disposition: A | Discharge status: Home / Self Care | Payer: 59 | Attending: Emergency Medicine | Admitting: Emergency Medicine

## 2021-09-18 ENCOUNTER — Emergency Department (HOSPITAL_BASED_OUTPATIENT_CLINIC_OR_DEPARTMENT_OTHER): Payer: 59 | Admitting: Radiology

## 2021-09-18 ENCOUNTER — Encounter (HOSPITAL_BASED_OUTPATIENT_CLINIC_OR_DEPARTMENT_OTHER): Payer: Self-pay | Admitting: Emergency Medicine

## 2021-09-18 ENCOUNTER — Encounter (HOSPITAL_COMMUNITY): Payer: Self-pay

## 2021-09-18 DIAGNOSIS — R000 Tachycardia, unspecified: Secondary | ICD-10-CM | POA: Insufficient documentation

## 2021-09-18 DIAGNOSIS — D72829 Elevated white blood cell count, unspecified: Secondary | ICD-10-CM | POA: Diagnosis not present

## 2021-09-18 DIAGNOSIS — R059 Cough, unspecified: Secondary | ICD-10-CM | POA: Diagnosis not present

## 2021-09-18 DIAGNOSIS — R0602 Shortness of breath: Secondary | ICD-10-CM

## 2021-09-18 DIAGNOSIS — J9600 Acute respiratory failure, unspecified whether with hypoxia or hypercapnia: Secondary | ICD-10-CM

## 2021-09-18 DIAGNOSIS — Z794 Long term (current) use of insulin: Secondary | ICD-10-CM | POA: Diagnosis not present

## 2021-09-18 DIAGNOSIS — R042 Hemoptysis: Secondary | ICD-10-CM | POA: Diagnosis present

## 2021-09-18 DIAGNOSIS — Z8616 Personal history of COVID-19: Secondary | ICD-10-CM | POA: Diagnosis not present

## 2021-09-18 DIAGNOSIS — R051 Acute cough: Secondary | ICD-10-CM

## 2021-09-18 DIAGNOSIS — J40 Bronchitis, not specified as acute or chronic: Secondary | ICD-10-CM | POA: Insufficient documentation

## 2021-09-18 LAB — BASIC METABOLIC PANEL
Anion gap: 10 (ref 5–15)
BUN: 12 mg/dL (ref 6–20)
CO2: 30 mmol/L (ref 22–32)
Calcium: 9.3 mg/dL (ref 8.9–10.3)
Chloride: 100 mmol/L (ref 98–111)
Creatinine, Ser: 1.03 mg/dL — ABNORMAL HIGH (ref 0.44–1.00)
GFR, Estimated: >60 mL/min (ref >60)
Glucose, Bld: 144 mg/dL — ABNORMAL HIGH (ref 70–99)
Potassium: 3.4 mmol/L — ABNORMAL LOW (ref 3.5–5.1)
Sodium: 140 mmol/L (ref 135–145)

## 2021-09-18 LAB — CBC
HCT: 41.9 % (ref 36.0–46.0)
Hemoglobin: 13.8 g/dL (ref 12.0–15.0)
MCH: 30.3 pg (ref 26.0–34.0)
MCHC: 32.9 g/dL (ref 30.0–36.0)
MCV: 91.9 fL (ref 80.0–100.0)
Platelets: 298 10*3/uL (ref 150–400)
RBC: 4.56 MIL/uL (ref 3.87–5.11)
RDW: 12.9 % (ref 11.5–15.5)
WBC: 17 10*3/uL — ABNORMAL HIGH (ref 4.0–10.5)
nRBC: 0 % (ref 0.0–0.2)

## 2021-09-18 LAB — LIPASE, BLOOD: Lipase: 22 U/L (ref 11–51)

## 2021-09-18 IMAGING — DX DG CHEST 2V
2 series · 2 of 2 positions shown · non-contrast
Comparison: [DATE]

CLINICAL DATA: Shortness of breath.  Hypoxia.  Cough.

EXAM:
CHEST - 2 VIEW

[chest pa]
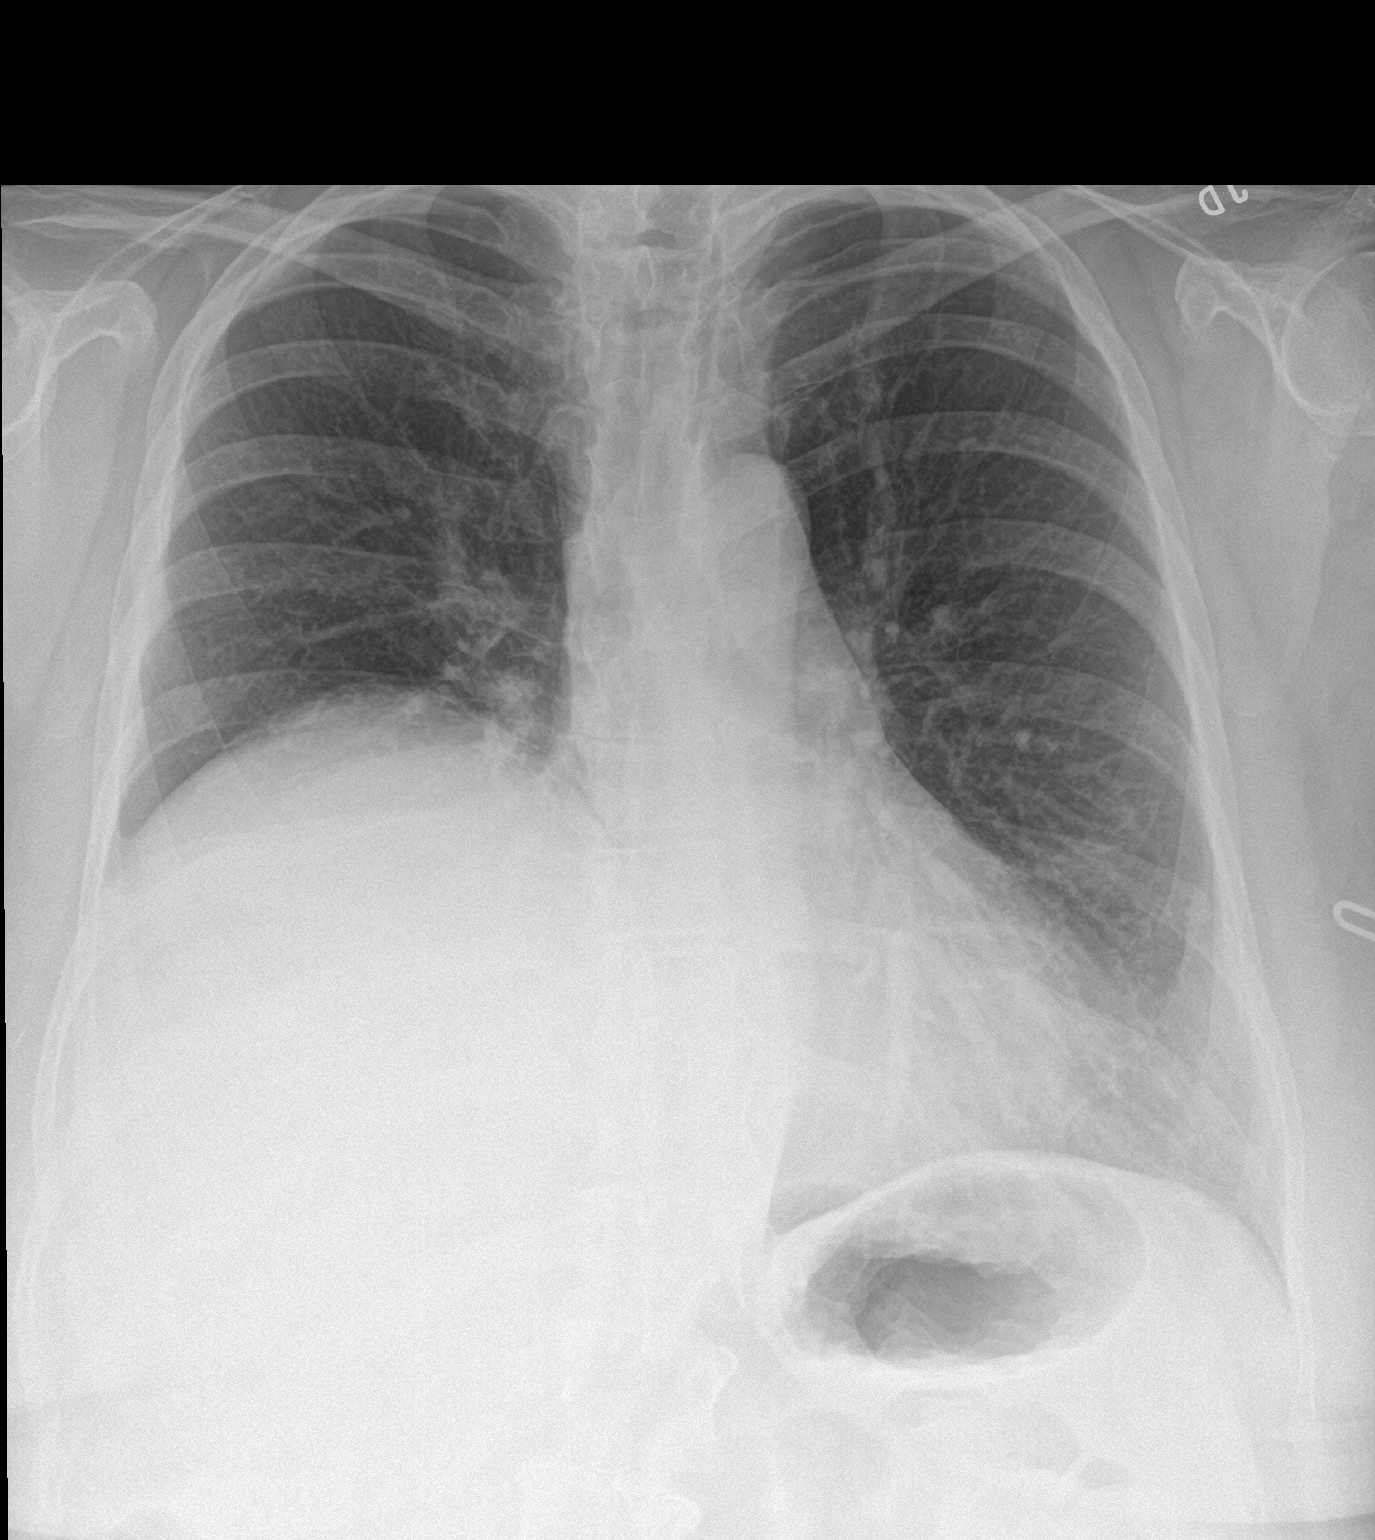

[chest lat]
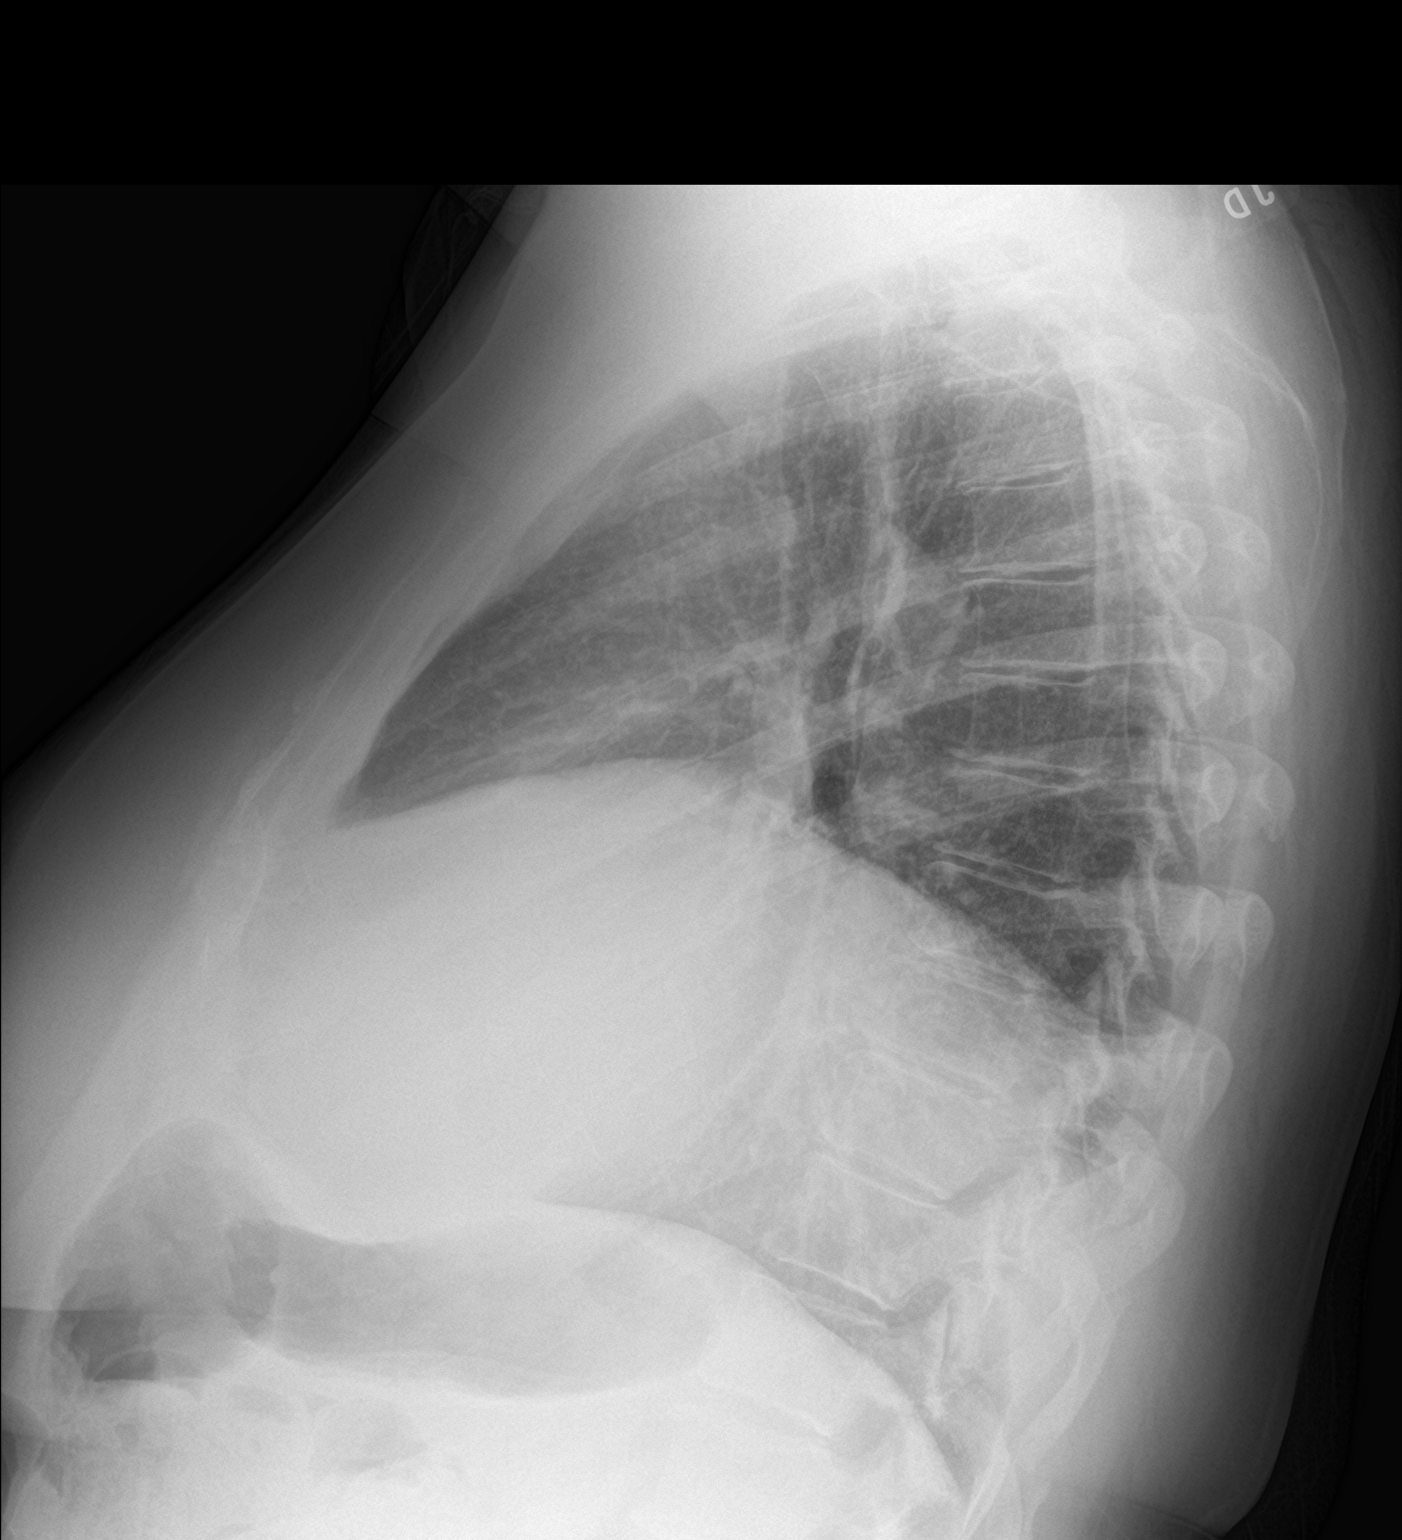

[2 of 2 positions shown; findings below may reference images not displayed]

FINDINGS: Stable normal sized heart and markedly elevated right hemidiaphragm.
Clear lungs. Mild peribronchial thickening. Mild scoliosis.
IMPRESSION: 1. Mild bronchitic changes.
2. Stable markedly elevated right hemidiaphragm.

## 2021-09-18 MED ORDER — HYDROCOD POLI-CHLORPHE POLI ER 10-8 MG/5ML PO SUER: 5.0000 mL | Freq: Two times a day (BID) | ORAL | 0 refills | Status: DC | PRN

## 2021-09-18 MED ORDER — DEXAMETHASONE SODIUM PHOSPHATE 10 MG/ML IJ SOLN
INTRAMUSCULAR | Status: AC
  Filled 2021-09-18: qty 1

## 2021-09-18 MED ORDER — ALBUTEROL SULFATE HFA 108 (90 BASE) MCG/ACT IN AERS
2.0000 | INHALATION_SPRAY | RESPIRATORY_TRACT | Status: DC | PRN
  Filled 2021-09-18: qty 6.7

## 2021-09-18 MED ORDER — HYDROCOD POLI-CHLORPHE POLI ER 10-8 MG/5ML PO SUER
5.0000 mL | Freq: Once | ORAL | Status: AC
  Administered 2021-09-18: 5 mL via ORAL
  Filled 2021-09-18: qty 5

## 2021-09-18 MED ORDER — SODIUM CHLORIDE 0.9 % IV BOLUS
1000.0000 mL | Freq: Once | INTRAVENOUS | Status: AC
  Administered 2021-09-18: 1000 mL via INTRAVENOUS

## 2021-09-18 MED ORDER — IPRATROPIUM-ALBUTEROL 0.5-2.5 (3) MG/3ML IN SOLN
RESPIRATORY_TRACT | Status: AC
  Filled 2021-09-18: qty 3

## 2021-09-18 MED ORDER — DEXAMETHASONE SODIUM PHOSPHATE 10 MG/ML IJ SOLN
10.0000 mg | Freq: Once | INTRAMUSCULAR | Status: AC
  Administered 2021-09-18: 10 mg via INTRAMUSCULAR

## 2021-09-18 MED ORDER — IPRATROPIUM-ALBUTEROL 0.5-2.5 (3) MG/3ML IN SOLN
3.0000 mL | Freq: Once | RESPIRATORY_TRACT | Status: AC
  Administered 2021-09-18: 3 mL via RESPIRATORY_TRACT

## 2021-09-18 NOTE — ED Triage Notes (Signed)
Pt was seen at [facility], suspicious for PE, sent here.

## 2021-09-18 NOTE — ED Provider Notes (Signed)
Alyssa Clay   CSN: [id number] Arrival date & time: 09/18/21  1315     History  Chief Complaint  Patient presents with   Hemoptysis    Alyssa Clay is a 56 y.o. female.  56 yo F with a chief complaint of cough.  This is been ongoing since she was diagnosed with COVID about 2 weeks ago.  She had been seen after that with a protracted cough and was seen and diagnosed with bronchitis.  She tells me that typically she has trouble getting over upper respiratory infections and is not uncommon for her to use nebulizer or inhaler in course of steroids.  Unfortunately her symptoms got a little bit worse over the past couple days.  She has had a little bit of blood in her sputum.  She followed up with urgent care today who sent her here for further evaluation.       Home Medications Prior to Admission medications   Medication Sig Start Date End Date Taking? Authorizing Provider  chlorpheniramine-HYDROcodone (TUSSIONEX PENNKINETIC ER) 10-8 MG/5ML Take 5 mLs by mouth every 12 (twelve) hours as needed for cough. 09/18/21  Yes Deno Etienne, DO  albuterol (PROVENTIL) (2.5 MG/3ML) 0.083% nebulizer solution Take 3 mLs (2.5 mg total) by nebulization every 4 (four) hours as needed for wheezing or shortness of breath (coughing fits). 02/28/21   Garnet Sierras, DO  albuterol (VENTOLIN HFA) 108 (90 Base) MCG/ACT inhaler Inhale 2 puffs into the lungs every 4 (four) hours as needed for wheezing or shortness of breath (coughing fits). 07/04/21   Garnet Sierras, DO  Atogepant (QULIPTA PO) Take by mouth.    [provider]  azithromycin (ZITHROMAX Z-PAK) 250 MG tablet Take 2 tablets on the first day, then take 1 tablet once a day for the next 4 days, then stop 09/16/21   Ambs, Kathrine Cords, FNP  budesonide (PULMICORT) 0.5 MG/2ML nebulizer solution TAKE 2 MLS (0.5 MG TOTAL) BY NEBULIZATION IN THE MORNING AND AT BEDTIME. TAKE DURING UPPER RESPIRATORY INFECTIONS FOR 1-2 WEEKS AT  A TIME. 03/21/21   Garnet Sierras, DO  budesonide-formoterol [facility]) 160-4.5 MCG/ACT inhaler Inhale 2 puffs into the lungs in the morning and at bedtime. with spacer and rinse mouth afterwards. 07/04/21   Garnet Sierras, DO  buPROPion (WELLBUTRIN XL) 150 MG 24 hr tablet TAKE 1 TABLET BY MOUTH DAILY 08/09/21   Koberlein, Steele Berg, MD  clonazePAM (KLONOPIN) 0.5 MG tablet Take 1 tablet (0.5 mg total) by mouth daily as needed for anxiety. 08/17/21   Caren Macadam, MD  cyclobenzaprine (FLEXERIL) 10 MG tablet Take 10 mg by mouth 3 (three) times daily as needed (migraines).    [provider]  DULoxetine (CYMBALTA) 60 MG capsule TAKE 1 CAPSULE BY MOUTH  DAILY 08/09/21   Koberlein, Steele Berg, MD  Estradiol (YUVAFEM) 10 MCG TABS vaginal tablet Place 1 tablet (10 mcg total) vaginally daily. 08/30/20   Caren Macadam, MD  famotidine (PEPCID) 20 MG tablet Take 1 tablet (20 mg total) by mouth 2 (two) times daily. 09/16/21   Dara Hoyer, FNP  gabapentin (NEURONTIN) 300 MG capsule Take 300-900 mg by mouth See admin instructions. Take 1 tablet every AM, three tablets at bedtime 01/03/18   [provider]  guaiFENesin-codeine 100-10 MG/5ML syrup Take 5-10 mLs by mouth 3 (three) times daily as needed for cough. 09/14/21   Fatima Blank, MD  montelukast (SINGULAIR) 10 MG tablet Take 1 tablet (  10 mg total) by mouth daily. 07/04/21   Garnet Sierras, DO  Multiple Vitamin (MULTIVITAMIN WITH MINERALS) TABS tablet Take 1 tablet by mouth daily.    [provider]  pantoprazole (PROTONIX) 40 MG tablet TAKE 1 TABLET BY MOUTH  TWICE DAILY BEFORE MEALS 08/09/21   Koberlein, Steele Berg, MD  predniSONE (DELTASONE) 10 MG tablet Prednisone 10 mg tablets. Take 2 tablets once a day for 4 days, then take 1 tablet on the 5th day, then stop. 09/12/21   Ambs, Kathrine Cords, FNP  Prochlorperazine Maleate (COMPAZINE PO) Take by mouth as needed.    [provider]  Respiratory Therapy Supplies (FLUTTER) DEVI Use 3  times daily as directed 01/22/19   Mannam, Praveen, MD  rizatriptan (MAXALT) 10 MG tablet TAKE 1 TABLET BY MOUTH ONCE AS NEEDED FOR MIGRAINE FOR UP TO 1 DOSE. MAY REPEAT IN 2 HOURS IF NEEDED 08/13/20   [provider]  rosuvastatin (CRESTOR) 5 MG tablet TAKE 1 TABLET BY MOUTH AT  BEDTIME 08/09/21   Koberlein, Andris Flurry C, MD  scopolamine (TRANSDERM-SCOP) 1 MG/3DAYS Place 1 patch (1.5 mg total) onto the skin every 3 (three) days. 08/25/21   Caren Macadam, MD  Semaglutide-Weight Management (WEGOVY) 1 MG/0.5ML SOAJ Inject 1 mg into the skin once a week. 08/29/21   Koberlein, Steele Berg, MD  Tezepelumab-ekko (TEZSPIRE) 210 MG/1.91ML SOAJ Inject 210 mg into the skin every 28 (twenty-eight) days. 08/17/21   Garnet Sierras, DO  traZODone (DESYREL) 100 MG tablet TAKE 1 TO 2 TABLETS BY  MOUTH AT BEDTIME AS NEEDED  FOR SLEEP 05/10/21   Koberlein, Steele Berg, MD  tretinoin (RETIN-A) 0.05 % cream Apply topically. 03/30/20   [provider]  Ubrogepant (UBRELVY PO) Take by mouth as needed.    [provider]  valACYclovir (VALTREX) 1000 MG tablet Take 1 tablet (1,000 mg total) by mouth 2 (two) times daily. Patient taking differently: Take 1,000 mg by mouth as needed. 07/15/20   Caren Macadam, MD      Allergies    Patient has no known allergies.    Review of Systems   Review of Systems  Physical Exam Updated Vital Signs BP (!) 104/59   Pulse (!) 103   Temp 98.3 F (36.8 C) (Oral)   Resp 20   LMP 12/20/2018   SpO2 92%  Physical Exam Vitals and nursing Clay reviewed.  Constitutional:      General: She is not in acute distress.    Appearance: She is well-developed. She is not diaphoretic.  HENT:     Head: Normocephalic and atraumatic.  Eyes:     Pupils: Pupils are equal, round, and reactive to light.  Cardiovascular:     Rate and Rhythm: Regular rhythm. Tachycardia present.     Heart sounds: No murmur heard.   No friction rub. No gallop.  Pulmonary:     Effort: Pulmonary  effort is normal.     Breath sounds: No wheezing or rales.     Comments: Bronchospastic cough Chest:     Chest wall: Tenderness present.  Abdominal:     General: There is no distension.     Palpations: Abdomen is soft.     Tenderness: There is no abdominal tenderness.  Musculoskeletal:        General: No tenderness.     Cervical back: Normal range of motion and neck supple.  Skin:    General: Skin is warm and dry.  Neurological:  Mental Status: She is alert and oriented to person, place, and time.  Psychiatric:        Behavior: Behavior normal.    ED Results / Procedures / Treatments   Labs (all labs ordered are listed, but only abnormal results are displayed) Labs Reviewed  CBC - Abnormal; Notable for the following components:      Result Value   WBC 17.0 (*)    All other components within normal limits  BASIC METABOLIC PANEL - Abnormal; Notable for the following components:   Potassium 3.4 (*)    Glucose, Bld 144 (*)    Creatinine, Ser 1.03 (*)    All other components within normal limits  LIPASE, BLOOD    EKG EKG Interpretation  Date/Time:  Sunday Sep 18 2021 13:30:00 EDT Ventricular Rate:  115 PR Interval:  154 QRS Duration: 93 QT Interval:  320 QTC Calculation: 443 R Axis:   -7 Text Interpretation: Sinus tachycardia Low voltage, precordial leads No significant change since last tracing Confirmed by Deno Etienne [phone]) on 09/18/2021 1:59:09 PM  Radiology DG Chest 2 View  Result Date: 09/18/2021 CLINICAL DATA:  Shortness of breath.  Hypoxia.  Cough. EXAM: CHEST - 2 VIEW COMPARISON:  09/14/2021 FINDINGS: Stable normal sized heart and markedly elevated right hemidiaphragm. Clear lungs. Mild peribronchial thickening. Mild scoliosis. IMPRESSION: 1. Mild bronchitic changes. 2. Stable markedly elevated right hemidiaphragm. Electronically Signed   By: Claudie Revering M.D.   On: 09/18/2021 12:11    Procedures Procedures    Medications Ordered in ED Medications   albuterol (VENTOLIN HFA) 108 (90 Base) MCG/ACT inhaler 2 puff (has no administration in time range)  sodium chloride 0.9 % bolus 1,000 mL (1,000 mLs Intravenous New Bag/Given 09/18/21 1358)  chlorpheniramine-HYDROcodone 10-8 MG/5ML suspension 5 mL (5 mLs Oral Given 09/18/21 1425)    ED Course/ Medical Decision Making/ A&P                           Medical Decision Making Amount and/or Complexity of Data Reviewed Labs: ordered.  Risk Prescription drug management.   56 yo F with a chief complaints of cough this been going on for about 2 weeks now.  Was diagnosed with COVID at the onset and has not significantly improved.  She has been seen for this multiple times and is currently being treated with azithromycin as well steroids.  She went to urgent care and was sent here for evaluation.  Patient most likely with just a protracted cough and has bronchitis on chest x-ray as independently interpreted by me.  This was done at urgent care and she was sent here to rule out pulmonary embolism.  I think she is not high risk for this I think much more likely her diagnosis is bronchitis.  I discussed this with her risk and benefits at bedside electing not for CT imaging at this time.  Will obtain a laboratory evaluation bolus of IV fluids.  Leukocytosis, likely due to steroid use.  No significant electrolyte abnormality.  No anemia.    Symptomatic therapy. PCP follow up.   2:47 PM:  I have discussed the diagnosis/risks/treatment options with the patient.  Evaluation and diagnostic testing in the emergency department does not suggest an emergent condition requiring admission or immediate intervention beyond what has been performed at this time.  They will follow up with  PCP. We also discussed returning to the ED immediately if new or worsening sx occur. We discussed  the sx which are most concerning (e.g., sudden worsening pain, fever, inability to tolerate by mouth) that necessitate immediate return.  Medications administered to the patient during their visit and any new prescriptions provided to the patient are listed below.  Medications given during this visit Medications  albuterol (VENTOLIN HFA) 108 (90 Base) MCG/ACT inhaler 2 puff (has no administration in time range)  sodium chloride 0.9 % bolus 1,000 mL (1,000 mLs Intravenous New Bag/Given 09/18/21 1358)  chlorpheniramine-HYDROcodone 10-8 MG/5ML suspension 5 mL (5 mLs Oral Given 09/18/21 1425)     The patient appears reasonably screen and/or stabilized for discharge and I doubt any other medical condition or other [facility] requiring further screening, evaluation, or treatment in the ED at this time prior to discharge.          Final Clinical Impression(s) / ED Diagnoses Final diagnoses:  Bronchitis    Rx / DC Orders ED Discharge Orders          Ordered    chlorpheniramine-HYDROcodone (TUSSIONEX PENNKINETIC ER) 10-8 MG/5ML  Every 12 hours PRN        09/18/21 [place], [place]li[place], DO 09/18/21 1447

## 2021-09-18 NOTE — Discharge Instructions (Signed)
Return for worsening difficulty breathing.  Follow up with your family.

## 2021-09-18 NOTE — ED Provider Notes (Signed)
[place]   MRN: [id number] DOB: 1965-11-18  Subjective:   Chief Complaint;  Chief Complaint  Patient presents with   Cough    Was diagnosed with bronchitis and chest pains have increased. - Entered by patient   Patient presents to Urgent Care with complaints of cough since 5/16. Patient reports she was sick after a cruise with covid, then bronchitis. Pt has been on steroids and breathing treatments. Cp has been worse and cough has worsened as well.  STARLEEN TRUSSELL is a 56 y.o. female presenting for persistent shortness of breath and coughing without fever.  Patient has history of asthma, diaphragm paralysis and was diagnosed with COVID-19 on May 16.  Patient was seen in the ER on 524 after being on a course of Levaquin and steroid not improving or feeling much better the chest x-ray at that time showed no focal pneumonia she was diagnosed with bronchitis and has now finished her course of antibiotic and is on the last day of her steroid but has not improved.  Current Facility-Administered Medications:    0.9 %  sodium chloride infusion, 500 mL, Intravenous, Once, [place], MD   tezepelumab-ekko (TEZSPIRE) 210 MG/1.91ML syringe 210 mg, 210 mg, Subcutaneous, Q28 days, Garnet Sierras, DO, 210 mg at 08/19/21 1138  Current Outpatient Medications:    albuterol (PROVENTIL) (2.5 MG/3ML) 0.083% nebulizer solution, Take 3 mLs (2.5 mg total) by nebulization every 4 (four) hours as needed for wheezing or shortness of breath (coughing fits)., Disp: 75 mL, Rfl: 2   albuterol (VENTOLIN HFA) 108 (90 Base) MCG/ACT inhaler, Inhale 2 puffs into the lungs every 4 (four) hours as needed for wheezing or shortness of breath (coughing fits)., Disp: 18 g, Rfl: 1   Atogepant (QULIPTA PO), Take by mouth., Disp: , Rfl:    azithromycin (ZITHROMAX Z-PAK) 250 MG tablet, Take 2 tablets on the first day, then take 1 tablet once a day for the next 4 days, then stop, Disp: 6 each, Rfl: 0    budesonide (PULMICORT) 0.5 MG/2ML nebulizer solution, TAKE 2 MLS (0.5 MG TOTAL) BY NEBULIZATION IN THE MORNING AND AT BEDTIME. TAKE DURING UPPER RESPIRATORY INFECTIONS FOR 1-2 WEEKS AT A TIME., Disp: 360 mL, Rfl: 0   budesonide-formoterol (SYMBICORT) 160-4.5 MCG/ACT inhaler, Inhale 2 puffs into the lungs in the morning and at bedtime. with spacer and rinse mouth afterwards., Disp: 3 each, Rfl: 3   buPROPion (WELLBUTRIN XL) 150 MG 24 hr tablet, TAKE 1 TABLET BY MOUTH DAILY, Disp: 90 tablet, Rfl: 3   clonazePAM (KLONOPIN) 0.5 MG tablet, Take 1 tablet (0.5 mg total) by mouth daily as needed for anxiety., Disp: 90 tablet, Rfl: 1   cyclobenzaprine (FLEXERIL) 10 MG tablet, Take 10 mg by mouth 3 (three) times daily as needed (migraines)., Disp: , Rfl:    DULoxetine (CYMBALTA) 60 MG capsule, TAKE 1 CAPSULE BY MOUTH  DAILY, Disp: 90 capsule, Rfl: 3   Estradiol (YUVAFEM) 10 MCG TABS vaginal tablet, Place 1 tablet (10 mcg total) vaginally daily., Disp: 8 tablet, Rfl: 0   famotidine (PEPCID) 20 MG tablet, Take 1 tablet (20 mg total) by mouth 2 (two) times daily., Disp: 60 tablet, Rfl: 1   gabapentin (NEURONTIN) 300 MG capsule, Take 300-900 mg by mouth See admin instructions. Take 1 tablet every AM, three tablets at bedtime, Disp: , Rfl:    guaiFENesin-codeine 100-10 MG/5ML syrup, Take 5-10 mLs by mouth 3 (three) times daily as needed for cough., Disp: 120 mL,  Rfl: 0   montelukast (SINGULAIR) 10 MG tablet, Take 1 tablet (10 mg total) by mouth daily., Disp: 90 tablet, Rfl: 3   Multiple Vitamin (MULTIVITAMIN WITH MINERALS) TABS tablet, Take 1 tablet by mouth daily., Disp: , Rfl:    pantoprazole (PROTONIX) 40 MG tablet, TAKE 1 TABLET BY MOUTH  TWICE DAILY BEFORE MEALS, Disp: 180 tablet, Rfl: 3   predniSONE (DELTASONE) 10 MG tablet, Prednisone 10 mg tablets. Take 2 tablets once a day for 4 days, then take 1 tablet on the 5th day, then stop., Disp: 9 tablet, Rfl: 0   Prochlorperazine Maleate (COMPAZINE PO), Take by  mouth as needed., Disp: , Rfl:    Respiratory Therapy Supplies (FLUTTER) DEVI, Use 3 times daily as directed, Disp: 1 each, Rfl: 0   rizatriptan (MAXALT) 10 MG tablet, TAKE 1 TABLET BY MOUTH ONCE AS NEEDED FOR MIGRAINE FOR UP TO 1 DOSE. MAY REPEAT IN 2 HOURS IF NEEDED, Disp: , Rfl:    rosuvastatin (CRESTOR) 5 MG tablet, TAKE 1 TABLET BY MOUTH AT  BEDTIME, Disp: 90 tablet, Rfl: 3   scopolamine (TRANSDERM-SCOP) 1 MG/3DAYS, Place 1 patch (1.5 mg total) onto the skin every 3 (three) days., Disp: 10 patch, Rfl: 0   Semaglutide-Weight Management (WEGOVY) 1 MG/0.5ML SOAJ, Inject 1 mg into the skin once a week., Disp: 2 mL, Rfl: 5   Tezepelumab-ekko (TEZSPIRE) 210 MG/1.91ML SOAJ, Inject 210 mg into the skin every 28 (twenty-eight) days., Disp: 1.91 mL, Rfl: 11   traZODone (DESYREL) 100 MG tablet, TAKE 1 TO 2 TABLETS BY  MOUTH AT BEDTIME AS NEEDED  FOR SLEEP, Disp: 180 tablet, Rfl: 3   tretinoin (RETIN-A) 0.05 % cream, Apply topically., Disp: , Rfl:    Ubrogepant (UBRELVY PO), Take by mouth as needed., Disp: , Rfl:    valACYclovir (VALTREX) 1000 MG tablet, Take 1 tablet (1,000 mg total) by mouth 2 (two) times daily. (Patient taking differently: Take 1,000 mg by mouth as needed.), Disp: 180 tablet, Rfl: 1   No Known Allergies  Past Medical History:  Diagnosis Date   Allergy    Anemia    Asthma    Constipation    issues x the past month -? 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   Objective:   Vitals: BP (!) 167/101   Pulse (!) 112   Temp 98.4 F (36.9 C) (Oral)   Resp 18   LMP 12/20/2018   SpO2 92%   Physical Exam Vitals and nursing note reviewed.   Constitutional:      General: She is not in acute distress.    Appearance: She is well-developed.     Comments: Appears tired  HENT:     Head: Normocephalic and atraumatic.  Eyes:     Conjunctiva/sclera: Conjunctivae normal.  Cardiovascular:     Rate and Rhythm: Regular rhythm. Tachycardia present.     Heart sounds: No murmur heard. Pulmonary:     Effort: Respiratory distress present.     Breath sounds: Rhonchi present.  Abdominal:     Palpations: Abdomen is soft.  Tenderness: There is no abdominal tenderness.  Musculoskeletal:        General: No swelling.     Cervical back: Neck supple.  Skin:    General: Skin is warm and dry.     Capillary Refill: Capillary refill takes less than 2 seconds.  Neurological:     Mental Status: She is alert.  Psychiatric:        Mood and Affect: Mood normal.    No results found for this or any previous visit (from the past 24 hour(s)).  DG Chest 2 View  Result Date: 09/18/2021 CLINICAL DATA:  Shortness of breath.  Hypoxia.  Cough. EXAM: CHEST - 2 VIEW COMPARISON:  09/14/2021 FINDINGS: Stable normal sized heart and markedly elevated right hemidiaphragm. Clear lungs. Mild peribronchial thickening. Mild scoliosis. IMPRESSION: 1. Mild bronchitic changes. 2. Stable markedly elevated right hemidiaphragm. Electronically Signed   By: Claudie Revering M.D.   On: 09/18/2021 12:11       Assessment and Plan :   No diagnosis found.  Meds ordered this encounter  Medications   ipratropium-albuterol (DUONEB) 0.5-2.5 (3) MG/3ML nebulizer solution 3 mL   dexamethasone (DECADRON) injection 10 mg    MDM:  ELLIOTTE MARSALIS is a 56 y.o. female presenting for persistent shortness of breath with worsening chest pain over the last few days.  Patient has a  history of recent COVID followed by antibiotic and steroid treatment which have not improved her symptoms.  Patient is hypoxic and encouraged to go to the emergency room for further testing including D-dimer  and lab work possible CT scan of the chest with contrast to rule out PE.  I discussed treatment, follow up and return instructions. Questions were answered. Patient/representative stated understanding of instructions and patient is stable for discharge.  Leida Lauth FNP-C MCN    Hezzie Bump, NP 09/18/21 [phone]

## 2021-09-18 NOTE — ED Triage Notes (Signed)
Patient presents to Urgent Care with complaints of cough since 5/16. Patient reports she was sick after a cruise with covid, then bronchitis. Pt has been on steroids and breathing treatments. Cp has been worse and cough has worsened as well.

## 2021-09-18 NOTE — Discharge Instructions (Addendum)
Your chest x-ray showed no evidence of acute pneumonia.  Given your persistent hypoxia and chest pain I advise you go to the ER to rule out pulmonary embolism or other causes.  You were given a shot of Decadron and a breathing treatment today however your pulse oximeter has remained in the upper 80s and low 90s and your heart rate is faster than I would expect.

## 2021-09-18 NOTE — ED Notes (Signed)
Alyssa Clay has an Albuterol inhaler with her.  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  She says that it has plenty of medicine in it and does not want to receive a new inhaler from [place] today.  A spacer was given for her to take with her to use with her Albuterol MDI at home.

## 2021-09-20 ENCOUNTER — Other Ambulatory Visit: Payer: Self-pay

## 2021-09-26 ENCOUNTER — Other Ambulatory Visit: Payer: Self-pay

## 2021-10-08 ENCOUNTER — Other Ambulatory Visit: Payer: Self-pay | Admitting: Family Medicine

## 2021-10-11 ENCOUNTER — Other Ambulatory Visit: Payer: Self-pay | Admitting: Obstetrics and Gynecology

## 2021-10-11 DIAGNOSIS — N6489 Other specified disorders of breast: Secondary | ICD-10-CM

## 2021-10-18 NOTE — Progress Notes (Signed)
HPI: Ms.Lelaina V Madore is a 56 y.o. female, who is here today to establish care.  Former PCP: Dr. Ethlyn Gallery Last preventive routine visit: 02/25/20.  Chronic medical problems: Depression, anxiety, hyperlipidemia, asthma,migraine headaches, aortic atherosclerosis, OSA (per records),GERD, and chronic back pain.  Concerns today: Weight loss medication. Her former PCP prescribed Wegovy, but is back order, so she would like an option. Katherine Roan initially prescribed in 06/2021.  She has not been consistent with following a healthful diet. She has not been exercising regularly for the past 6 weeks. She does not feel like she can follow a specific diet, she has tried son in the past unsuccessfully. She has followed with weight loss clinic.  She takes gabapentin for lumbar radiculopathy. OSA: She stated that she never had a sleep study.  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 Most of the time she feels rested in the morning when she gets up. Sleeps about 7 to 8 hours.  Hyperlipidemia on Crestor 5 mg daily. Lab Results  Component Value Date   CHOL 195 08/22/2021   HDL 63.80 08/22/2021   LDLCALC 109 (H) 08/22/2021   LDLDIRECT 175.0 08/30/2020   TRIG 113.0 08/22/2021   CHOLHDL 3 08/22/2021   Depression, anxiety, and insomnia: Currently she is on trazodone 100 mg 2 tablets daily at bedtime, duloxetine 60 mg daily, and clonazepam 0.5 mg daily as needed. She is also on Wellbutrin XL 150 mg daily.  She is hoping to be on clonazepam temporarily, started in 08-19-21 after her mother died; she has been dealing with worsening anxiety since then. In the past she followed with psychiatrist, about 4 to 5 years ago.Has not been able to find a provider in the area.    10/19/2021    3:37 PM 08/22/2021   12:20 PM 06/01/2021    1:16 PM 02/25/2020    7:56 AM 02/17/2020    2:40 PM  Depression screen PHQ 2/9  Decreased Interest 0 0 2 0 0  Down, Depressed, Hopeless 0  0 0 1  PHQ  - 2 Score 0 0 2 0 1  Altered sleeping 0 0 0  0  Tired, decreased energy 0 0 0  1  Change in appetite 1 0 0  1  Feeling bad or failure about yourself  0 0 0  0  Trouble concentrating 0 0 0  0  Moving slowly or fidgety/restless 0 0 0  0  Suicidal thoughts 0 0 0  0  PHQ-9 Score 1 0 2  3  Difficult doing work/chores Not difficult at all Not difficult at all   Not difficult at all   She follows with neurologist for her migraines, takes Flexeril 10 mg tid prn for cervical pain.  Review of Systems  Constitutional:  Negative for activity change, appetite change and fever.  Eyes:  Negative for redness and visual disturbance.  Respiratory:  Negative for cough and shortness of breath.   Cardiovascular:  Negative for chest pain and palpitations.  Gastrointestinal:  Negative for abdominal pain, nausea and vomiting.       Negative for changes in bowel habits.  Genitourinary:  Negative for decreased urine volume and hematuria.  Musculoskeletal:  Positive for back pain and neck pain.  Allergic/Immunologic: Positive for environmental allergies.  Neurological:  Negative for syncope and weakness.  Psychiatric/Behavioral:  The patient is nervous/anxious.   Rest see pertinent positives and negatives per HPI.  Current Outpatient Medications on File Prior to Visit  Medication Sig Dispense  Refill   albuterol (PROVENTIL) (2.5 MG/3ML) 0.083% nebulizer solution Take 3 mLs (2.5 mg total) by nebulization every 4 (four) hours as needed for wheezing or shortness of breath (coughing fits). 75 mL 2   albuterol (VENTOLIN HFA) 108 (90 Base) MCG/ACT inhaler Inhale 2 puffs into the lungs every 4 (four) hours as needed for wheezing or shortness of breath (coughing fits). 18 g 1   Atogepant (QULIPTA PO) Take by mouth.     budesonide (PULMICORT) 0.5 MG/2ML nebulizer solution TAKE 2 MLS (0.5 MG TOTAL) BY NEBULIZATION IN THE MORNING AND AT BEDTIME. TAKE DURING UPPER RESPIRATORY INFECTIONS FOR 1-2 WEEKS AT A TIME. 360 mL 0    budesonide-formoterol (SYMBICORT) 160-4.5 MCG/ACT inhaler Inhale 2 puffs into the lungs in the morning and at bedtime. with spacer and rinse mouth afterwards. 3 each 3   buPROPion (WELLBUTRIN XL) 150 MG 24 hr tablet TAKE 1 TABLET BY MOUTH DAILY 90 tablet 3   clonazePAM (KLONOPIN) 0.5 MG tablet Take 1 tablet (0.5 mg total) by mouth daily as needed for anxiety. 90 tablet 1   cyclobenzaprine (FLEXERIL) 10 MG tablet Take 10 mg by mouth 3 (three) times daily as needed (migraines).     DULoxetine (CYMBALTA) 60 MG capsule TAKE 1 CAPSULE BY MOUTH  DAILY 90 capsule 3   Estradiol (YUVAFEM) 10 MCG TABS vaginal tablet Place 1 tablet (10 mcg total) vaginally daily. 8 tablet 0   gabapentin (NEURONTIN) 300 MG capsule Take 300-900 mg by mouth See admin instructions. Take 1 tablet every AM, three tablets at bedtime     montelukast (SINGULAIR) 10 MG tablet Take 1 tablet (10 mg total) by mouth daily. 90 tablet 3   Multiple Vitamin (MULTIVITAMIN WITH MINERALS) TABS tablet Take 1 tablet by mouth daily.     pantoprazole (PROTONIX) 40 MG tablet TAKE 1 TABLET BY MOUTH  TWICE DAILY BEFORE MEALS 180 tablet 3   Prochlorperazine Maleate (COMPAZINE PO) Take by mouth as needed.     Respiratory Therapy Supplies (FLUTTER) DEVI Use 3 times daily as directed 1 each 0   rizatriptan (MAXALT) 10 MG tablet TAKE 1 TABLET BY MOUTH ONCE AS NEEDED FOR MIGRAINE FOR UP TO 1 DOSE. MAY REPEAT IN 2 HOURS IF NEEDED     rosuvastatin (CRESTOR) 5 MG tablet TAKE 1 TABLET BY MOUTH AT  BEDTIME 90 tablet 3   Tezepelumab-ekko (TEZSPIRE) 210 MG/1.91ML SOAJ Inject 210 mg into the skin every 28 (twenty-eight) days. 1.91 mL 11   traZODone (DESYREL) 100 MG tablet TAKE 1 TO 2 TABLETS BY  MOUTH AT BEDTIME AS NEEDED  FOR SLEEP 180 tablet 3   tretinoin (RETIN-A) 0.05 % cream Apply topically.     Ubrogepant (UBRELVY PO) Take by mouth as needed.     valACYclovir (VALTREX) 1000 MG tablet Take 1 tablet (1,000 mg total) by mouth 2 (two) times daily. (Patient taking  differently: Take 1,000 mg by mouth as needed.) 180 tablet 1   Current Facility-Administered Medications on File Prior to Visit  Medication Dose Route Frequency Provider Last Rate Last Admin   tezepelumab-ekko (TEZSPIRE) 210 MG/1.91ML syringe 210 mg  210 mg Subcutaneous Q28 days Garnet Sierras, DO   210 mg at 08/19/21 1138   Past Medical History:  Diagnosis Date   Allergy    Anemia    Asthma    Constipation    issues x the past month -? 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 USE   Enlarged ovary 09/15/2020   noted on ct scan, per pt   FUO (fever of unknown origin) 10/21/2019   GERD (gastroesophageal reflux disease)    Hypertension    PAST hx    Migraine    No Known Allergies  Family History  Problem Relation Age of Onset   Depression Mother    Lymphoma Mother 65   Lung cancer Mother    Hypertension Father    Heart attack Father 27   Hypertension Brother    High blood pressure Brother    High Cholesterol Brother    Healthy Daughter    Colon cancer Maternal Aunt 65   Esophageal cancer Neg Hx    Pancreatic cancer Neg Hx    Stomach cancer Neg Hx    Colon polyps Neg Hx    Rectal cancer Neg Hx     Social History   Socioeconomic History   Marital status: Married    Spouse name: Not on file   Number of children: Not on file   Years of education: Not on file   Highest education level: Not on file  Occupational History   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  Tobacco Use   Smoking status: Never   Smokeless tobacco: Never  Vaping Use   Vaping Use: Never used  Substance and Sexual Activity   Alcohol use: Yes    Comment: occ 2-3 beers a month    Drug use: Never   Sexual activity: Yes    Partners: Male  Other Topics Concern   Not on file  Social History Narrative   Not on file   Social Determinants of Health   Financial Resource Strain: Not on file  Food Insecurity: Not on file  Transportation Needs: Not on  file  Physical Activity: Not on file  Stress: Not on file  Social Connections: Not on file   Vitals:   10/19/21 1528  BP: 128/80  Pulse: (!) 103  Resp: 12  SpO2: 97%   Wt Readings from Last 3 Encounters:  10/19/21 234 lb 4 oz (106.3 kg)  09/14/21 245 lb (111.1 kg)  09/12/21 245 lb (111.1 kg)   Body mass index is 38.39 kg/m.  Physical Exam Vitals and nursing note reviewed.  Constitutional:      General: She is not in acute distress.    Appearance: She is well-developed.  HENT:     Head: Normocephalic and atraumatic.     Mouth/Throat:     Mouth: Mucous membranes are moist.     Pharynx: Oropharynx is clear.  Eyes:     Conjunctiva/sclera: Conjunctivae normal.  Cardiovascular:     Rate and Rhythm: Regular rhythm. Tachycardia present.     Pulses:          Dorsalis pedis pulses are 2+ on the right side and 2+ on the left side.     Heart sounds: No murmur heard. Pulmonary:     Effort: Pulmonary effort is normal. No respiratory distress.     Breath sounds: Normal breath sounds.  Abdominal:     Palpations: Abdomen is soft. There is no mass.     Tenderness: There is no abdominal tenderness.  Skin:    General: Skin is warm.     Findings: No erythema or rash.  Neurological:     General: No focal deficit present.     Mental Status: She is alert and oriented to person, place, and time.     Gait:  Gait normal.  Psychiatric:        Mood and Affect: Mood is anxious. Affect is tearful.     Comments: Well groomed, good eye contact.   ASSESSMENT AND PLAN:  Ms.Elner was seen today for establish care.  Diagnoses and all orders for this visit:  Class 2 severe obesity with serious comorbidity and body mass index (BMI) of 38.0 to 38.9 in adult, unspecified obesity type ([place]) She understands the benefits of wt loss as well as adverse effects of obesity. Consistency with healthy diet and physical activity encouraged. Some of her medications can be contributing factor.  Making  positive life style changes, small steps at the time, will provide long lasting results with minimal risk for adverse effects; so I do not recommend pharmacologic treatment. She does not want a referral to weight loss clinic.  She will establish with another provider here in our clinic that can continue pharmacologic treatment.  Insomnia, unspecified type Problem seems to be well controlled. We discussed some side effects of trazodone and the risk of interaction with some of her other medications. She will try to decrease dose of trazodone from 200 mg daily to 150 mg daily. Continue good sleep hygiene.  Hyperlipidemia, unspecified hyperlipidemia type Continue rosuvastatin 5 mg daily. Low-fat diet also recommended. This could be rechecked in 08/2022.  Anxiety disorder, unspecified type Problem is not well controlled. Continue duloxetine 60 mg daily, she will try to decrease trazodone from 200 mg to 150 mg daily, and continue clonazepam 0.5 mg daily as needed. CBT may also help.  Episode of recurrent major depressive disorder, unspecified depression episode severity (HCC) Continue duloxetine 60 mg daily and Wellbutrin XL 150 mg daily. She will try to decrease trazodone from 200 mg 150 mg. We discussed some side effects of medication and the risks of interactions (trazodone, duloxetine, and Flexeril).  No follow-ups on file.  Linzey Ramser G. [place], MD  [facility] [place] office.

## 2021-10-19 ENCOUNTER — Ambulatory Visit (INDEPENDENT_AMBULATORY_CARE_PROVIDER_SITE_OTHER): Payer: 59 | Admitting: Family Medicine

## 2021-10-19 ENCOUNTER — Encounter: Payer: Self-pay | Admitting: Family Medicine

## 2021-10-19 VITALS — BP 128/80 | HR 103 | Resp 12 | Ht 65.5 in | Wt 234.2 lb

## 2021-10-19 DIAGNOSIS — F419 Anxiety disorder, unspecified: Secondary | ICD-10-CM | POA: Diagnosis not present

## 2021-10-19 DIAGNOSIS — E669 Obesity, unspecified: Secondary | ICD-10-CM | POA: Insufficient documentation

## 2021-10-19 DIAGNOSIS — E785 Hyperlipidemia, unspecified: Secondary | ICD-10-CM | POA: Diagnosis not present

## 2021-10-19 DIAGNOSIS — G4700 Insomnia, unspecified: Secondary | ICD-10-CM

## 2021-10-19 DIAGNOSIS — F339 Major depressive disorder, recurrent, unspecified: Secondary | ICD-10-CM

## 2021-10-19 DIAGNOSIS — Z6838 Body mass index (BMI) 38.0-38.9, adult: Secondary | ICD-10-CM | POA: Insufficient documentation

## 2021-10-19 NOTE — Patient Instructions (Signed)
A few things to remember from today's visit:   Hyperlipidemia, unspecified hyperlipidemia type  Anxiety disorder, unspecified type  Episode of recurrent major depressive disorder, unspecified depression episode severity ([place])  If you need refills please call your pharmacy. Do not use My Chart to request refills or for acute issues that need immediate attention.   You can  establish with a different provider in out practice than prescribes  wt loss meds. A new provider in coming 10/2021, I think she does wt loss management. Try to decrease Trazodone, rest no changes.  Please be sure medication list is accurate. If a new problem present, please set up appointment sooner than planned today.

## 2021-10-31 ENCOUNTER — Ambulatory Visit: Payer: 59

## 2021-10-31 ENCOUNTER — Ambulatory Visit
Admission: RE | Admit: 2021-10-31 | Discharge: 2021-10-31 | Disposition: A | Discharge status: Home / Self Care | Payer: 59 | Source: Ambulatory Visit | Attending: Obstetrics and Gynecology | Admitting: Obstetrics and Gynecology

## 2021-10-31 DIAGNOSIS — N6489 Other specified disorders of breast: Secondary | ICD-10-CM

## 2021-11-13 NOTE — Progress Notes (Signed)
Follow Up Note  RE: Alyssa Clay MRN: [id number] DOB: [birth year] Date of Office Visit: 11/14/2021  Referring provider: Caren Macadam, MD Primary care provider: Caren Macadam, MD (Inactive)  Chief Complaint: Follow-up (Definite increase in asthma symptoms since air quality change)  History of Present Illness: I had the pleasure of seeing Alyssa Clay for a follow up visit at the [facility] [place] [facility] [place] on 11/14/2021. She is a 56 y.o. female, who is being followed for asthma, nonallergic rhinitis, reflux and recurrent infections. Her previous allergy office visit was on 09/12/2021 with Alyssa Morgan, FNP via telemedicine. Today is a regular follow up visit.  Asthma Currently on Symbicort 139mg 2 puffs once a day and sometimes forgets to take it at night. Getting Tezspire injections every 4 weeks at home. Still taking montelukast.   She went to the ER due to post covid-19 infection in May. [place] was treated with prednisone and antibiotics.  Doing much better now. Trying to do flutter valve as well.   Patient's migraines are worsening. Her mother passed away recently and she has been emotional.   She started on Zonisamide and today woke up with rash and now has a yeast infection.  Non-allergic rhinitis Still having some symptoms but antihistamines seem to make her nasal passages too drying.    Reflux Stable with Nexium.  09/18/2021 ER visit: "56yo F with a chief complaints of cough this been going on for about 2 weeks now.  Was diagnosed with COVID at the onset and has not significantly improved.  She has been seen for this multiple times and is currently being treated with azithromycin as well steroids.  She went to urgent care and was sent here for evaluation.  Patient most likely with just a protracted cough and has bronchitis on chest x-ray as independently interpreted by me.  This was done at urgent care and she was sent  here to rule out pulmonary embolism.  I think she is not high risk for this I think much more likely her diagnosis is bronchitis.  I discussed this with her risk and benefits at bedside electing not for CT imaging at this time.  Will obtain a laboratory evaluation bolus of IV fluids.   Leukocytosis, likely due to steroid use.  No significant electrolyte abnormality.  No anemia.     Symptomatic therapy. PCP follow up.    2:47 PM:  I have discussed the diagnosis/risks/treatment options with the patient.  Evaluation and diagnostic testing in the emergency department does not suggest an emergent condition requiring admission or immediate intervention beyond what has been performed at this time.  They will follow up with  PCP. We also discussed returning to the ED immediately if new or worsening sx occur. We discussed the sx which are most concerning (e.g., sudden worsening pain, fever, inability to tolerate by mouth) that necessitate immediate return. Medications administered to the patient during their visit and any new prescriptions provided to the patient are listed below."  Assessment and Plan: Alyssa Clay a 56y.o. female with: Severe persistent asthma without complication Past history - Spiriva caused coughing. Normal alpha-1 level. Seen by pulmonary.Started Tezspire on 03/31/21 Interim history - Covid-19 in May requiring prednisone. Sometime forgets to take Symbicort.  Today's spirometry showed restriction - worse than previous one.  Daily controller medication(s): Start Breo 2037m 1 puff one a day and rinse mouth after each use. Let me know if  not covered.  STOP Singulair.  Do the flutter valve and incentive spirometer. Continue Tezspire injection every 4 weeks at home.  During upper respiratory infections/asthma flares:  Start Budesonide 0.'5mg'$  nebulizer twice a day for 1-2 weeks until your breathing symptoms return to baseline.  Pretreat with albuterol 2 puffs or albuterol nebulizer.  If you  need to use your albuterol nebulizer machine back to back within 15-30 minutes with no relief then please go to the ER/urgent care for further evaluation.  May use albuterol rescue inhaler 2 puffs every 4 to 6 hours as needed for shortness of breath, chest tightness, coughing, and wheezing. May use albuterol rescue inhaler 2 puffs 5 to 15 minutes prior to strenuous physical activities. Monitor frequency of use.  Keep track of infections/bronchitis. Get spirometry at next visit.  Atelectasis Past history - 9/9/202 CT chest showed: 1. Prominent elevation of the right hemidiaphragm with associated right lung base atelectasis including complete right middle lobe atelectasis. Right hemidiaphragm paralysis not excluded. Saw pulm. Encouraged using flutter valve and incentive spirometer.   Rash and other nonspecific skin eruption New rash - patient thinks it's from new medication Zonisamide. Take allegra '180mg'$  once a day.  If rash does not improve let us know. Take pictures.  Stop Zonisamide.   Vaginal yeast infection Take diflucan '150mg'$  and if not better in 72 hours then take another dose.  Chronic rhinitis Past history - All seasonal and perennial aeroallergen skin tests are negative despite a positive histamine control.  Tried Flonase, Xhance, Astelin, Xyzal, Periactin, and Claritin without adequate symptom relief.  Interm history - antihistamines all too drying. May use Flonase (fluticasone) nasal spray 1 spray per nostril twice a day as needed for nasal congestion.  Nasal saline spray (i.e., Simply Saline) or nasal saline lavage (i.e., NeilMed) is recommended as needed and prior to medicated nasal sprays.  Gastroesophageal reflux disease Stable.  Continue reflux lifestyle modifications and diet.  Continue Nexium '40mg'$  daily in the morning.   Migraine Follow up with PCP and neurology regarding this.  Return in about 4 months (around 03/17/2022).  Meds ordered this encounter   Medications   fluticasone furoate-vilanterol (BREO ELLIPTA) 200-25 MCG/ACT AEPB    Sig: Inhale 1 puff into the lungs daily. Rinse mouth after each use.    Dispense:  60 each    Refill:  5   fluconazole (DIFLUCAN) 150 MG tablet    Sig: Take 1 tablet (150 mg total) by mouth daily. If no improvement after 72 hours then take second pill.    Dispense:  2 tablet    Refill:  0   Lab Orders  No laboratory test(s) ordered today    Diagnostics: Spirometry:  Tracings reviewed. Her effort: Good reproducible efforts. FVC: 1.90L FEV1: 1.40L, 48% predicted FEV1/FVC ratio: 74% Interpretation: Spirometry consistent with possible restrictive disease.  Please see scanned spirometry results for details.  Medication List:  Current Outpatient Medications  Medication Sig Dispense Refill   albuterol (PROVENTIL) (2.5 MG/3ML) 0.083% nebulizer solution Take 3 mLs (2.5 mg total) by nebulization every 4 (four) hours as needed for wheezing or shortness of breath (coughing fits). 75 mL 2   albuterol (VENTOLIN HFA) 108 (90 Base) MCG/ACT inhaler Inhale 2 puffs into the lungs every 4 (four) hours as needed for wheezing or shortness of breath (coughing fits). 18 g 1   Atogepant (QULIPTA PO) Take by mouth.     budesonide (PULMICORT) 0.5 MG/2ML nebulizer solution TAKE 2 MLS (0.5 MG TOTAL) BY NEBULIZATION IN  THE MORNING AND AT BEDTIME. TAKE DURING UPPER RESPIRATORY INFECTIONS FOR 1-2 WEEKS AT A TIME. 360 mL 0   buPROPion (WELLBUTRIN XL) 150 MG 24 hr tablet TAKE 1 TABLET BY MOUTH DAILY 90 tablet 3   clonazePAM (KLONOPIN) 0.5 MG tablet Take 1 tablet (0.5 mg total) by mouth daily as needed for anxiety. 90 tablet 1   cyclobenzaprine (FLEXERIL) 10 MG tablet Take 10 mg by mouth 3 (three) times daily as needed (migraines).     DULoxetine (CYMBALTA) 60 MG capsule TAKE 1 CAPSULE BY MOUTH  DAILY 90 capsule 3   esomeprazole (NEXIUM) 40 MG capsule Take 40 mg by mouth daily at 12 noon.     Estradiol (YUVAFEM) 10 MCG TABS vaginal  tablet Place 1 tablet (10 mcg total) vaginally daily. 8 tablet 0   fluconazole (DIFLUCAN) 150 MG tablet Take 1 tablet (150 mg total) by mouth daily. If no improvement after 72 hours then take second pill. 2 tablet 0   fluticasone furoate-vilanterol (BREO ELLIPTA) 200-25 MCG/ACT AEPB Inhale 1 puff into the lungs daily. Rinse mouth after each use. 60 each 5   Multiple Vitamin (MULTIVITAMIN WITH MINERALS) TABS tablet Take 1 tablet by mouth daily.     prochlorperazine (COMPAZINE) 10 MG tablet Take by mouth.     Respiratory Therapy Supplies (FLUTTER) DEVI Use 3 times daily as directed 1 each 0   rizatriptan (MAXALT) 10 MG tablet TAKE 1 TABLET BY MOUTH ONCE AS NEEDED FOR MIGRAINE FOR UP TO 1 DOSE. MAY REPEAT IN 2 HOURS IF NEEDED     rosuvastatin (CRESTOR) 5 MG tablet TAKE 1 TABLET BY MOUTH AT  BEDTIME 90 tablet 3   Tezepelumab-ekko (TEZSPIRE) 210 MG/1.91ML SOAJ Inject 210 mg into the skin every 28 (twenty-eight) days. 1.91 mL 11   traZODone (DESYREL) 100 MG tablet TAKE 1 TO 2 TABLETS BY  MOUTH AT BEDTIME AS NEEDED  FOR SLEEP 180 tablet 3   tretinoin (RETIN-A) 0.05 % cream Apply topically.     Ubrogepant (UBRELVY PO) Take by mouth as needed.     valACYclovir (VALTREX) 1000 MG tablet Take 1 tablet (1,000 mg total) by mouth 2 (two) times daily. (Patient taking differently: Take 1,000 mg by mouth as needed.) 180 tablet 1   gabapentin (NEURONTIN) 300 MG capsule Take 300-900 mg by mouth See admin instructions. Take 1 tablet every AM, three tablets at bedtime (Patient not taking: Reported on 11/14/2021)     Current Facility-Administered Medications  Medication Dose Route Frequency Provider Last Rate Last Admin   tezepelumab-ekko (TEZSPIRE) 210 MG/1.91ML syringe 210 mg  210 mg Subcutaneous Q28 days Garnet Sierras, DO   210 mg at 08/19/21 1138   Allergies: Allergies  Allergen Reactions   Zonisamide Hives   I reviewed her past medical history, social history, family history, and environmental history and no  significant changes have been reported from her previous visit.  Review of Systems  Constitutional:  Negative for appetite change, chills, fever and unexpected weight change.  HENT:  Negative for congestion and rhinorrhea.   Eyes:  Negative for itching.  Respiratory:  Negative for cough, chest tightness, shortness of breath and wheezing.   Gastrointestinal:  Negative for abdominal pain.  Skin:  Negative for rash.  Allergic/Immunologic: Positive for environmental allergies.  Neurological:  Positive for headaches.    Objective: BP 132/88   Pulse (!) 101   Temp (!) 97.3 F (36.3 C) (Temporal)   Ht 5' 5.5" (1.664 m)   Wt 232 lb 12.8  oz (105.6 kg)   LMP 12/20/2018   SpO2 96%   BMI 38.15 kg/m  Body mass index is 38.15 kg/m. Physical Exam Vitals and nursing note reviewed.  Constitutional:      Appearance: Normal appearance. She is well-developed.  HENT:     Head: Normocephalic and atraumatic.     Right Ear: Tympanic membrane and external ear normal.     Left Ear: Tympanic membrane and external ear normal.     Nose: Nose normal.     Mouth/Throat:     Mouth: Mucous membranes are moist.     Pharynx: Oropharynx is clear.  Eyes:     Conjunctiva/sclera: Conjunctivae normal.  Cardiovascular:     Rate and Rhythm: Normal rate and regular rhythm.     Heart sounds: Normal heart sounds. No murmur heard. Pulmonary:     Effort: Pulmonary effort is normal.     Breath sounds: Normal breath sounds. No wheezing, rhonchi or rales.  Musculoskeletal:     Cervical back: Neck supple.  Skin:    General: Skin is warm.     Findings: No rash.  Neurological:     Mental Status: She is alert and oriented to person, place, and time.  Psychiatric:        Behavior: Behavior normal.    Previous notes and tests were reviewed. The plan was reviewed with the patient/family, and all questions/concerned were addressed.  It was my pleasure to see Alyssa Clay today and participate in her care. Please feel free  to contact me with any questions or concerns.  Sincerely,  Rexene Alberts, DO Allergy & Immunology  [facility] office: [place] office: [phone]

## 2021-11-14 ENCOUNTER — Encounter: Payer: Self-pay | Admitting: Allergy

## 2021-11-14 ENCOUNTER — Ambulatory Visit (INDEPENDENT_AMBULATORY_CARE_PROVIDER_SITE_OTHER): Payer: 59 | Admitting: Allergy

## 2021-11-14 VITALS — BP 132/88 | HR 101 | Temp 97.3°F | Ht 65.5 in | Wt 232.8 lb

## 2021-11-14 DIAGNOSIS — J310 Chronic rhinitis: Secondary | ICD-10-CM | POA: Diagnosis not present

## 2021-11-14 DIAGNOSIS — K219 Gastro-esophageal reflux disease without esophagitis: Secondary | ICD-10-CM

## 2021-11-14 DIAGNOSIS — J4550 Severe persistent asthma, uncomplicated: Secondary | ICD-10-CM

## 2021-11-14 DIAGNOSIS — B3731 Acute candidiasis of vulva and vagina: Secondary | ICD-10-CM | POA: Insufficient documentation

## 2021-11-14 DIAGNOSIS — J9811 Atelectasis: Secondary | ICD-10-CM

## 2021-11-14 DIAGNOSIS — B999 Unspecified infectious disease: Secondary | ICD-10-CM | POA: Insufficient documentation

## 2021-11-14 DIAGNOSIS — R21 Rash and other nonspecific skin eruption: Secondary | ICD-10-CM

## 2021-11-14 MED ORDER — FLUCONAZOLE 150 MG PO TABS: 150.0000 mg | ORAL_TABLET | Freq: Every day | ORAL | 0 refills | Status: DC

## 2021-11-14 MED ORDER — FLUTICASONE FUROATE-VILANTEROL 200-25 MCG/ACT IN AEPB
1.0000 | INHALATION_SPRAY | Freq: Every day | RESPIRATORY_TRACT | 5 refills | Status: DC
Start: 2021-11-14 — End: 2022-01-19

## 2021-11-14 NOTE — Assessment & Plan Note (Signed)
Past history - 9/9/202 CT chest showed: 1. Prominent elevation of the right hemidiaphragm with associated right lung base atelectasis including complete right middle lobe atelectasis. Right hemidiaphragm paralysis not excluded. Saw pulm.  Encouraged using flutter valve and incentive spirometer.

## 2021-11-14 NOTE — Assessment & Plan Note (Signed)
   Follow up with PCP and neurology regarding this.

## 2021-11-14 NOTE — Assessment & Plan Note (Signed)
Past history - All seasonal and perennial aeroallergen skin tests are negative despite a positive histamine control.  Tried Flonase, Xhance, Astelin, Xyzal, Periactin, and Claritin without adequate symptom relief.  Interm history - antihistamines all too drying.  May use Flonase (fluticasone) nasal spray 1 spray per nostril twice a day as needed for nasal congestion.   Nasal saline spray (i.e., Simply Saline) or nasal saline lavage (i.e., NeilMed) is recommended as needed and prior to medicated nasal sprays.

## 2021-11-14 NOTE — Assessment & Plan Note (Signed)
Past history - Spiriva caused coughing. Normal alpha-1 level. Seen by pulmonary.Started Tezspire on 03/31/21 Interim history - Covid-19 in May requiring prednisone. Sometime forgets to take Symbicort.   Today's spirometry showed restriction - worse than previous one.  . Daily controller medication(s): Start Breo 245mg 1 puff one a day and rinse mouth after each use. o Let me know if not covered.  .Marland KitchenSTOP Singulair.  . Do the flutter valve and incentive spirometer. . Continue Tezspire injection every 4 weeks at home.  . During upper respiratory infections/asthma flares:  o Start Budesonide 0.'5mg'$  nebulizer twice a day for 1-2 weeks until your breathing symptoms return to baseline.  o Pretreat with albuterol 2 puffs or albuterol nebulizer.  o If you need to use your albuterol nebulizer machine back to back within 15-30 minutes with no relief then please go to the ER/urgent care for further evaluation.  . May use albuterol rescue inhaler 2 puffs every 4 to 6 hours as needed for shortness of breath, chest tightness, coughing, and wheezing. May use albuterol rescue inhaler 2 puffs 5 to 15 minutes prior to strenuous physical activities. Monitor frequency of use.   Keep track of infections/bronchitis.  Get spirometry at next visit.

## 2021-11-14 NOTE — Assessment & Plan Note (Signed)
Stable.   Continue reflux lifestyle modifications and diet.   Continue Nexium '40mg'$  daily in the morning.

## 2021-11-14 NOTE — Patient Instructions (Addendum)
Rash Take allegra '180mg'$  once a day.  If rash does not improve let us know. Take pictures.  Avoid zonisamide   Yeast infection Take diflucan '150mg'$  and if not better in 72 hours then take another dose.   Asthma/atelectasis Daily controller medication(s): Start Breo 226mg 1 puff one a day and rinse mouth after each use. Let me know if not covered.  STOP Singulair.  Do the flutter valve and incentive spirometer. Continue Tezspire injection every 4 weeks at home.  During upper respiratory infections/asthma flares:  Start Budesonide 0.'5mg'$  nebulizer twice a day for 1-2 weeks until your breathing symptoms return to baseline.  Pretreat with albuterol 2 puffs or albuterol nebulizer.  If you need to use your albuterol nebulizer machine back to back within 15-30 minutes with no relief then please go to the ER/urgent care for further evaluation.  May use albuterol rescue inhaler 2 puffs every 4 to 6 hours as needed for shortness of breath, chest tightness, coughing, and wheezing. May use albuterol rescue inhaler 2 puffs 5 to 15 minutes prior to strenuous physical activities. Monitor frequency of use.  Asthma control goals:  Full participation in all desired activities (may need albuterol before activity) Albuterol use two times or less a week on average (not counting use with activity) Cough interfering with sleep two times or less a month Oral steroids no more than once a year No hospitalizations  Keep track of infections/bronchitis.  Chronic rhinitis May use Flonase (fluticasone) nasal spray 1 spray per nostril twice a day as needed for nasal congestion.  Nasal saline spray (i.e., Simply Saline) or nasal saline lavage (i.e., NeilMed) is recommended as needed and prior to medicated nasal sprays.   Gastroesophageal reflux disease Continue reflux lifestyle modifications and diet.  Continue Nexium '40mg'$  daily in the morning.   Follow up in 4 months or sooner if needed.

## 2021-11-14 NOTE — Assessment & Plan Note (Signed)
.   Take diflucan '150mg'$  and if not better in 72 hours then take another dose.

## 2021-11-14 NOTE — Assessment & Plan Note (Signed)
New rash - patient thinks it's from new medication Zonisamide. . Take allegra '180mg'$  once a day.  . If rash does not improve let us know. . Take pictures.  . Stop Zonisamide.

## 2021-11-17 ENCOUNTER — Encounter: Payer: Self-pay | Admitting: Family Medicine

## 2021-11-17 ENCOUNTER — Ambulatory Visit (INDEPENDENT_AMBULATORY_CARE_PROVIDER_SITE_OTHER): Payer: 59 | Admitting: Family Medicine

## 2021-11-17 VITALS — BP 118/78 | HR 98 | Temp 98.9°F | Ht 65.5 in | Wt 238.3 lb

## 2021-11-17 DIAGNOSIS — J4550 Severe persistent asthma, uncomplicated: Secondary | ICD-10-CM

## 2021-11-17 DIAGNOSIS — F3341 Major depressive disorder, recurrent, in partial remission: Secondary | ICD-10-CM | POA: Diagnosis not present

## 2021-11-17 DIAGNOSIS — G43709 Chronic migraine without aura, not intractable, without status migrainosus: Secondary | ICD-10-CM | POA: Diagnosis not present

## 2021-11-17 DIAGNOSIS — Z6838 Body mass index (BMI) 38.0-38.9, adult: Secondary | ICD-10-CM

## 2021-11-17 MED ORDER — RIZATRIPTAN BENZOATE 10 MG PO TABS: ORAL_TABLET | ORAL | 11 refills | Status: DC

## 2021-11-17 MED ORDER — WEGOVY 1.7 MG/0.75ML ~~LOC~~ SOAJ: 1.7000 mg | SUBCUTANEOUS | 2 refills | Status: DC

## 2021-11-17 MED ORDER — BUPROPION HCL ER (XL) 300 MG PO TB24: 300.0000 mg | ORAL_TABLET | Freq: Every day | ORAL | 1 refills | Status: DC

## 2021-11-17 NOTE — Patient Instructions (Addendum)
Goal calorie intake for the day :1400 kcal/day  Goal protein intake for the day: 85 grams per day  Try any sort of app to track protein and carb intake.   Set aside regular cheat days.  Try to consume 20 grams of fiber daily

## 2021-11-17 NOTE — Progress Notes (Signed)
Established Patient Office Visit  Subjective   Patient ID: Alyssa Clay, female    DOB: 30-Aug-1965  Age: 56 y.o. MRN: [id number]  Chief Complaint  Patient presents with   Establish Care    Here to establish care.   Chronic migraines - patient reports her previous neurologist retired and she needs a new one. We reviewed her medications and she needs refills on her maxalt 10 mg. Reports that her headaches are relatively controlled with this medication, that she was receiving injections from her previous neurologist which were also working for her. She reports no new migraines recently.   Severe persistent asthma - patient recently saw her allergist and I have reviewed the note with her. She reports she is well maintained on her once daily Breo. Last episode of Bronchitis was when she was dx'd with COVID about 3 months ago. States that since then her lung function had returned to normal. No issues since. She is currently not on any oxygen. Does not smoke.  Depression - she reports that her mother recently passed away about 7 weeks ago. Reports her depression symptoms have worsened since then and she is having more frequent crying spells. We reviewed her depression medications - wellbutrin, cymbalta, trazodone and she is also taking klonopin regularly.   Obesity - current BMI is 39 pt states she was previously taking Wegovy injections weekly and this was helping her quite a bit. Has been off of it for about 2 months and she is having significant difficult controlling her cravings for high carbohydrate foods. We had a long (>30 minute) discussion during which I provided extensive education and counseling.     Patient Active Problem List   Diagnosis Date Noted   Recurrent infections 11/14/2021   Vaginal yeast infection 11/14/2021   Rash and other nonspecific skin eruption 11/14/2021   Class 2 obesity with body mass index (BMI) of 38.0 to 38.9 in adult 10/19/2021   History of COVID-19  09/12/2021   Chronic migraine without aura without status migrainosus, not intractable 05/26/2021   Lumbar radiculopathy, right 10/08/2020   SI (sacroiliac) joint dysfunction 09/16/2020   Somatic dysfunction of spine, sacral 09/16/2020   OSA (obstructive sleep apnea) 12/18/2019   FUO (fever of unknown origin) 10/21/2019   S/P bilateral breast reduction 06/19/2019   Back pain 04/15/2019   Neck pain 04/15/2019   History of frequent upper respiratory infection 01/16/2019   Atelectasis 01/16/2019   Severe persistent asthma without complication 22/05/5425   Shortness of breath 01/01/2019   Primary osteoarthritis of both knees 07/10/2018   Hypertension 03/27/2018   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     Objective:     BP 118/78 (BP Location: Left Arm, Patient Position: Sitting, Cuff Size: Large)   Pulse 98   Temp 98.9 F (37.2 C) (Oral)   Ht 5' 5.5" (1.664 m)   Wt 238 lb 4.8 oz (108.1 kg)   LMP 12/20/2018   SpO2 97%   BMI 39.05 kg/m    Physical Exam Vitals reviewed.  Constitutional:      Appearance: Normal appearance. She is well-groomed. She is obese.  HENT:     Head: Normocephalic  and atraumatic.  Eyes:     Extraocular Movements: Extraocular movements intact.     Pupils: Pupils are equal, round, and reactive to light.  Cardiovascular:     Rate and Rhythm: Normal rate and regular rhythm.     Heart sounds: S1 normal and S2 normal.  Pulmonary:     Effort: Pulmonary effort is normal.     Breath sounds: Normal breath sounds and air entry.  Abdominal:     General: Abdomen is flat. Bowel sounds are normal.     Palpations: Abdomen is soft.   Musculoskeletal:        General: Normal range of motion.     Cervical back: Normal range of motion and neck supple.     Right lower leg: No edema.     Left lower leg: No edema.  Skin:    General: Skin is warm and dry.  Neurological:     Mental Status: She is alert and oriented to person, place, and time. Mental status is at baseline.     Gait: Gait is intact.  Psychiatric:        Mood and Affect: Mood and affect normal.        Speech: Speech normal.        Behavior: Behavior normal.        Judgment: Judgment normal.      No results found for any visits on 11/17/21.  Last metabolic panel Lab Results  Component Value Date   GLUCOSE 144 (H) 09/18/2021   NA 140 09/18/2021   K 3.4 (L) 09/18/2021   CL 100 09/18/2021   CO2 30 09/18/2021   BUN 12 09/18/2021   CREATININE 1.03 (H) 09/18/2021   GFRNONAA >60 09/18/2021   CALCIUM 9.3 09/18/2021   PROT 7.7 08/22/2021   ALBUMIN 4.5 08/22/2021   BILITOT 0.4 08/22/2021   ALKPHOS 67 08/22/2021   AST 18 08/22/2021   ALT 17 08/22/2021   ANIONGAP 10 09/18/2021   Last hemoglobin A1c Lab Results  Component Value Date   HGBA1C 5.4 08/22/2021      The 10-year ASCVD risk score (Arnett DK, et al., 2019) is: 1.2%    Assessment & Plan:   Problem List Items Addressed This Visit       Cardiovascular and Mediastinum   Chronic migraine without aura without status migrainosus, not intractable - Primary    Her neurologist retired and she is requesting a new referral. Will place this referral and I have refilled her maxalt 10 mg daily as needed for acute migraine management. I have reviewed her medication list and updated it today as well.      Relevant Medications   rizatriptan (MAXALT) 10 MG tablet   buPROPion (WELLBUTRIN XL) 300 MG 24 hr tablet   Other Relevant Orders   Ambulatory referral to Neurology     Respiratory   Severe persistent asthma without complication    Patient follows regularly with her allergist and I reviewed  the last progress note from the specialist. She is on albuterol inhaler and Breo 200/25 mcg at this time for her regular maintenance. States these medications are working well for her at this time. Lungs clear on exam today, O2 sat WNL. Continue to monitor lung function at each visit.        Other   Class 2 obesity with body mass index (BMI) of 38.0 to 38.9 in adult    I have had an extensive (>30 minute)  Conversation today with the patient about  healthy eating habits, exercise, calorie and carb goals for sustainable and successful weight loss. I gave the patient caloric and protein daily intake values as well as described the importance of increasing fiber and water intake. Will rx Wegovy 1.7 mg weekly for her since she did so well on the 1 mg dose. I will see her back in 3 months for a weight check and follow up. At that time if she is tolerating the 1.7 mg I will increase her to 2.4 mg weekly.       Relevant Medications   Semaglutide-Weight Management (WEGOVY) 1.7 MG/0.75ML SOAJ   Depression    Pt's mother recently passed away 2 months ago. She is still currently in the grieving process. States that her depressive symptoms have increased since her mother passed. Will increase her wellbutrin to 300 mg daily for better symptom management. Also, will continue her cymbalta 60 mg and trazodone 100 mg daily. Will follow up with her in 3 months to reassess her sx.      Relevant Medications   buPROPion (WELLBUTRIN XL) 300 MG 24 hr tablet    Return in about 3 months (around 02/17/2022) for weight check and depression f/u.    Farrel Conners, MD

## 2021-11-17 NOTE — Assessment & Plan Note (Addendum)
Her neurologist retired and she is requesting a new referral. Will place this referral and I have refilled her maxalt 10 mg daily as needed for acute migraine management. I have reviewed her medication list and updated it today as well.

## 2021-11-17 NOTE — Assessment & Plan Note (Signed)
I have had an extensive (>30 minute)  Conversation today with the patient about healthy eating habits, exercise, calorie and carb goals for sustainable and successful weight loss. I gave the patient caloric and protein daily intake values as well as described the importance of increasing fiber and water intake. Will rx Wegovy 1.7 mg weekly for her since she did so well on the 1 mg dose. I will see her back in 3 months for a weight check and follow up. At that time if she is tolerating the 1.7 mg I will increase her to 2.4 mg weekly.

## 2021-11-17 NOTE — Assessment & Plan Note (Addendum)
Patient follows regularly with her allergist and I reviewed the last progress note from the specialist. She is on albuterol inhaler and Breo 200/25 mcg at this time for her regular maintenance. States these medications are working well for her at this time. Lungs clear on exam today, O2 sat WNL. Continue to monitor lung function at each visit.

## 2021-11-17 NOTE — Assessment & Plan Note (Signed)
Pt's mother recently passed away 2 months ago. She is still currently in the grieving process. States that her depressive symptoms have increased since her mother passed. Will increase her wellbutrin to 300 mg daily for better symptom management. Also, will continue her cymbalta 60 mg and trazodone 100 mg daily. Will follow up with her in 3 months to reassess her sx.

## 2021-11-21 ENCOUNTER — Encounter: Payer: Self-pay | Admitting: Family Medicine

## 2021-11-30 ENCOUNTER — Encounter: Payer: Self-pay | Admitting: Neurology

## 2021-12-06 NOTE — Progress Notes (Unsigned)
NEUROLOGY CONSULTATION NOTE  Alyssa Clay MRN: [id number] DOB: Jan 01, 1966  Referring provider: Loralyn Freshwater, MD Primary care provider: Micheline Rough, MD  Reason for consult:  migraine  Assessment/Plan:   Migraine with aura, without status migrainosus, intractable  Migraine prevention:  Plan to start Emgality Migraine rescue:  Will have her try samples of Zavzpret as either second line (to rizatriptan) or first line.  May use Flexeril if needed. Limit use of pain relievers to no more than 2 days out of week to prevent risk of rebound or medication-overuse headache. Keep headache diary Follow up 4 months    Subjective:  Alyssa Clay is a 56 year old female with asthma, depression who presents for migraines.  History supplemented by referring provider and prior neurologist's notes.  Onset:  22 or 56 years old with onset of menses menstrual until 44 and then more often.  Pregnant at 27 -  Location:  frontal, temporal, top of head bilaterally Quality:  squeezing, stabbing Intensity:  moderate to severe Aura:  sometimes zigzag pattern in visioin Prodrome:  absent Associated symptoms:  Photophobia, phonophobia, osmophobia.  She denies associated nausea, vomiting, autonomic symptoms, unilateral numbness or weakness. Duration:  1 to 3 days Frequency:  5 to 12 a month Triggers:  bright lights, stress Relieving factors:  rest, dark room Activity:  aggravates  MRI of brain without contrast on 10/14/2012 was normal  Past NSAIDS/analgesics:  ibuprofen, Tylenol, naproxen, Fioricet, Codeine, darvocet, Norco, Tylenol #3, tramadol, Midrin, toradol Past abortive triptans:  Amerge, Frova, Relpax, sumatriptan tab/[place], Zomig, Treximet Past abortive ergotamine: none Past muscle relaxants:  tizanidine, skelaxin Past anti-emetic:  promethazine Past antihypertensive medications:  propranolol, atenolol, verapamil, metoprolol Past antidepressant medications:  amitriptyline,  sertraline, Lexapro, Celexa, Prozac Past anticonvulsant medications:  topiramate, Depakote, zonisamide Past anti-CGRP:  Lajean Silvius, Nurtec, Aimovig Past vitamins/Herbal/Supplements:  Migrelief BID Past antihistamines/decongestants:  none Other past therapies:  Botox, sphenopalatine ganglion block, trigger point injections, dry needling  Current NSAIDS/analgesics:  none Current triptans:  Maxalt '10mg'$  (effective 50% of time) Current ergotamine:  none Current anti-emetic:  Compazine '10mg'$  Current muscle relaxants:  Flexeril '10mg'$  (neck and head soreness) Current Antihypertensive medications:  none Current Antidepressant medications:  Cymbalta '60mg'$  daily, Wellbutrin, trazodone Current Anticonvulsant medications:  gabapentin 300 in AM and '900mg'$  in PM Current anti-CGRP:  none Current Vitamins/Herbal/Supplements:  none Current Antihistamines/Decongestants:  none Other therapy:  none birth control:  none  Caffeine:  3 cups of coffee daily Diet:  Hydrates.  No soda Exercise:  goes to gym (2 cardio days, 2 weight lifting days, speed weight day) Depression:  yes; Anxiety:  yes - mother passed away recently Sleep hygiene:  good with trazodone Family history of headache:  mom, siblings, aunts, cousins      PAST MEDICAL HISTORY: Past Medical History:  Diagnosis Date   Allergy    Anemia    Asthma    Constipation    issues x the past month -? 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   Enlarged ovary 09/15/2020   noted on ct scan, per pt   FUO (fever of unknown origin) 10/21/2019   GERD (gastroesophageal reflux disease)    Hypertension    PAST hx    Migraine     PAST SURGICAL HISTORY: Past Surgical History:  Procedure Laterality Date   APPENDECTOMY     BREAST REDUCTION SURGERY Bilateral 06/12/2019  Procedure: BREAST REDUCTION WITH LIPOSUCTION;  Surgeon: Wallace Going, DO;  Location: [place];  Service: Plastics;  Laterality: Bilateral;  4 hours, please   CESAREAN SECTION  1994   DILATION AND CURETTAGE OF UTERUS     multiple due to miscarriages   KNEE ARTHROSCOPY Left 2013   LAPAROSCOPIC APPENDECTOMY N/A 12/08/2018   Procedure: APPENDECTOMY LAPAROSCOPIC;  Surgeon: Donnie Mesa, MD;  Location: [place];  Service: General;  Laterality: N/A;   REDUCTION MAMMAPLASTY Bilateral 2021   TENNIS ELBOW RELEASE/NIRSCHEL PROCEDURE  2009   WISDOM TOOTH EXTRACTION      MEDICATIONS: Current Outpatient Medications on File Prior to Visit  Medication Sig Dispense Refill   albuterol (PROVENTIL) (2.5 MG/3ML) 0.083% nebulizer solution Take 3 mLs (2.5 mg total) by nebulization every 4 (four) hours as needed for wheezing or shortness of breath (coughing fits). 75 mL 2   albuterol (VENTOLIN HFA) 108 (90 Base) MCG/ACT inhaler Inhale 2 puffs into the lungs every 4 (four) hours as needed for wheezing or shortness of breath (coughing fits). 18 g 1   budesonide (PULMICORT) 0.5 MG/2ML nebulizer solution TAKE 2 MLS (0.5 MG TOTAL) BY NEBULIZATION IN THE MORNING AND AT BEDTIME. TAKE DURING UPPER RESPIRATORY INFECTIONS FOR 1-2 WEEKS AT A TIME. 360 mL 0   buPROPion (WELLBUTRIN XL) 300 MG 24 hr tablet Take 1 tablet (300 mg total) by mouth daily. 90 tablet 1   clonazePAM (KLONOPIN) 0.5 MG tablet Take 1 tablet (0.5 mg total) by mouth daily as needed for anxiety. 90 tablet 1   cyclobenzaprine (FLEXERIL) 10 MG tablet Take 10 mg by mouth 3 (three) times daily as needed (migraines).     DULoxetine (CYMBALTA) 60 MG capsule TAKE 1 CAPSULE BY MOUTH  DAILY 90 capsule 3   Estradiol (YUVAFEM) 10 MCG TABS vaginal tablet Place 1 tablet (10 mcg total) vaginally daily. 8 tablet 0   fluconazole (DIFLUCAN) 150 MG tablet Take 1 tablet (150 mg total) by mouth daily. If no improvement after 72 hours then take second pill. 2 tablet 0   fluticasone furoate-vilanterol (BREO ELLIPTA) 200-25 MCG/ACT AEPB Inhale 1 puff into the  lungs daily. Rinse mouth after each use. 60 each 5   gabapentin (NEURONTIN) 300 MG capsule Take 300-900 mg by mouth See admin instructions. Take 1 tablet every AM, three tablets at bedtime     Multiple Vitamin (MULTIVITAMIN WITH MINERALS) TABS tablet Take 1 tablet by mouth daily.     prochlorperazine (COMPAZINE) 10 MG tablet Take by mouth.     Respiratory Therapy Supplies (FLUTTER) DEVI Use 3 times daily as directed 1 each 0   rizatriptan (MAXALT) 10 MG tablet TAKE 1 TABLET BY MOUTH ONCE AS NEEDED FOR MIGRAINE FOR UP TO 1 DOSE. MAY REPEAT IN 2 HOURS IF NEEDED 10 tablet 11   rosuvastatin (CRESTOR) 5 MG tablet TAKE 1 TABLET BY MOUTH AT  BEDTIME 90 tablet 3   Semaglutide-Weight Management (WEGOVY) 1.7 MG/0.75ML SOAJ Inject 1.7 mg into the skin once a week. 3 mL 2   Tezepelumab-ekko (TEZSPIRE) 210 MG/1.91ML SOAJ Inject 210 mg into the skin every 28 (twenty-eight) days. 1.91 mL 11   traZODone (DESYREL) 100 MG tablet TAKE 1 TO 2 TABLETS BY  MOUTH AT BEDTIME AS NEEDED  FOR SLEEP 180 tablet 3   tretinoin (RETIN-A) 0.05 % cream Apply topically.     valACYclovir (VALTREX) 1000 MG tablet Take 1 tablet (1,000 mg total) by mouth 2 (two) times daily. (Patient taking differently: Take 1,000  mg by mouth as needed.) 180 tablet 1   Current Facility-Administered Medications on File Prior to Visit  Medication Dose Route Frequency Provider Last Rate Last Admin   tezepelumab-ekko (TEZSPIRE) 210 MG/1.91ML syringe 210 mg  210 mg Subcutaneous Q28 days Garnet Sierras, DO   210 mg at 08/19/21 1138    ALLERGIES: Allergies  Allergen Reactions   Zonisamide Hives    FAMILY HISTORY: Family History  Problem Relation Age of Onset   Depression Mother    Lymphoma Mother 25   Lung cancer Mother    Hypertension Father    Heart attack Father 41   Hypertension Brother    High blood pressure Brother    High Cholesterol Brother    Healthy Daughter    Colon cancer Maternal Aunt 65   Esophageal cancer Neg Hx    Pancreatic  cancer Neg Hx    Stomach cancer Neg Hx    Colon polyps Neg Hx    Rectal cancer Neg Hx     Objective:  Blood pressure 125/82, pulse 99, height 5' 5.5" (1.664 m), weight 230 lb (104.3 kg), last menstrual period 12/20/2018, SpO2 99 %. General: No acute distress.  Patient appears well-groomed.   Head:  Normocephalic/atraumatic Eyes:  fundi examined but not visualized Neck: supple, no paraspinal tenderness, full range of motion Heart: regular rate and rhythm Neurological Exam: Mental status: alert and oriented to person, place, and time, speech fluent and not dysarthric, language intact. Cranial nerves: CN I: not tested CN II: pupils equal, round and reactive to light, visual fields intact CN III, IV, VI:  full range of motion, no nystagmus, no ptosis CN V: facial sensation intact. CN VII: upper and lower face symmetric CN VIII: hearing intact CN IX, X: gag intact, uvula midline CN XI: sternocleidomastoid and trapezius muscles intact CN XII: tongue midline Bulk & Tone: normal, no fasciculations. Motor:  muscle strength 5/5 throughout Sensation:  Pinprick, temperature and vibratory sensation intact. Deep Tendon Reflexes:  2+ throughout,  toes downgoing.   Finger to nose testing:  Without dysmetria.   Heel to shin:  Without dysmetria.   Gait:  Normal station and stride.  Romberg negative.    Thank you for allowing me to take part in the care of this patient.  Metta Clines, DO  CC:  Loralyn Freshwater, MD  Micheline Rough, MD

## 2021-12-07 ENCOUNTER — Encounter: Payer: Self-pay | Admitting: Neurology

## 2021-12-07 ENCOUNTER — Ambulatory Visit (INDEPENDENT_AMBULATORY_CARE_PROVIDER_SITE_OTHER): Payer: 59 | Admitting: Neurology

## 2021-12-07 VITALS — BP 125/82 | HR 99 | Ht 65.5 in | Wt 230.0 lb

## 2021-12-07 DIAGNOSIS — G43119 Migraine with aura, intractable, without status migrainosus: Secondary | ICD-10-CM | POA: Diagnosis not present

## 2021-12-07 MED ORDER — EMGALITY 120 MG/ML ~~LOC~~ SOAJ: 240.0000 mg | Freq: Once | SUBCUTANEOUS | 0 refills | Status: AC

## 2021-12-07 MED ORDER — EMGALITY 120 MG/ML ~~LOC~~ SOAJ: 120.0000 mg | SUBCUTANEOUS | 5 refills | Status: DC

## 2021-12-07 NOTE — Progress Notes (Signed)
Medication Samples have been provided to the patient.  Drug name: zavzpret       Strength: '10mg'$         Qty: 2  LOT: [id number]  Exp.Date: 2/25  Dosing instructions: once daily as needed  The patient has been instructed regarding the correct time, dose, and frequency of taking this medication, including desired effects and most common side effects.

## 2021-12-07 NOTE — Patient Instructions (Signed)
  Start Emgality - first dose is 2 injections, then 1 injection every 28 days thereafter Take Zavzpret spray - 1 spray in one nostril (maximum 1 in 24 hours) - may use as first line or second line (to rizatriptan) Limit use of pain relievers to no more than 2 days out of the week.  These medications include acetaminophen, NSAIDs (ibuprofen/Advil/Motrin, naproxen/Aleve, triptans (Imitrex/sumatriptan), Excedrin, and narcotics.  This will help reduce risk of rebound headaches. Be aware of common food triggers:  - Caffeine:  coffee, black tea, cola, Mt. Dew  - Chocolate  - Dairy:  aged cheeses (brie, blue, cheddar, gouda, [place], provolone, [place], Swiss, etc), chocolate milk, buttermilk, sour cream, limit eggs and yogurt  - Nuts, peanut butter  - Alcohol  - Cereals/grains:  FRESH breads (fresh bagels, sourdough, doughnuts), yeast productions  - Processed/canned/aged/cured meats (pre-packaged deli meats, hotdogs)  - MSG/glutamate:  soy sauce, flavor enhancer, pickled/preserved/marinated foods  - Sweeteners:  aspartame (Equal, Nutrasweet).  Sugar and Splenda are okay  - Vegetables:  legumes (lima beans, lentils, snow peas, fava beans, pinto peans, peas, garbanzo beans), sauerkraut, onions, olives, pickles  - Fruit:  avocados, bananas, citrus fruit (orange, lemon, grapefruit), mango  - Other:  Frozen meals, macaroni and cheese Routine exercise Stay adequately hydrated (aim for 64 oz water daily) Keep headache diary Maintain proper stress management Maintain proper sleep hygiene Do not skip meals Consider supplements:  magnesium citrate '400mg'$  daily, riboflavin '400mg'$  daily, coenzyme Q10 '100mg'$  three times daily.

## 2021-12-08 ENCOUNTER — Telehealth (HOSPITAL_COMMUNITY): Payer: Self-pay | Admitting: Pharmacy Technician

## 2021-12-08 NOTE — Telephone Encounter (Signed)
Patient Advocate Encounter   Received notification that prior authorization for Emgality '120MG'$ /ML auto-injectors (migraine) is required.   PA submitted on 12/08/2021 Key [id number] Status is pending       Lyndel Safe, [place] Patient Advocate Specialist [place] Patient Advocate Team Direct Number: [phone]  Fax: [phone]

## 2021-12-09 NOTE — Telephone Encounter (Signed)
Patient Advocate Encounter  Received notification that the request for prior authorization for Emgality '120MG'$ /ML auto-injectors (migraine) has been denied due to Per your health plan's criteria, this drug is covered if you meet the following: You have tried or cannot use Ajovy. The information provided does not show that you meet the criteria listed above.       Lyndel Safe, [place] Patient Advocate Specialist [place] Patient Advocate Team Direct Number: [phone]  Fax: [phone]

## 2021-12-12 ENCOUNTER — Other Ambulatory Visit: Payer: Self-pay | Admitting: Neurology

## 2021-12-12 MED ORDER — AJOVY 225 MG/1.5ML ~~LOC~~ SOAJ: 225.0000 mg | SUBCUTANEOUS | 5 refills | Status: DC

## 2021-12-12 NOTE — Progress Notes (Unsigned)
Alyssa Clay

## 2021-12-13 NOTE — Telephone Encounter (Signed)
Please contact patient to let her know I sent a prescription for Ajovy, which is another monthly migraine injectable medication due to insurance preference.  Patient advised. Patient to call us back if she like the Zavzpret nasal spray.

## 2021-12-15 ENCOUNTER — Ambulatory Visit: Payer: 59

## 2021-12-15 ENCOUNTER — Telehealth: Payer: Self-pay | Admitting: Family Medicine

## 2021-12-15 MED ORDER — TRAZODONE HCL 100 MG PO TABS: 100.0000 mg | ORAL_TABLET | Freq: Every evening | ORAL | 0 refills | Status: DC | PRN

## 2021-12-15 NOTE — Telephone Encounter (Signed)
Ok to [facility] 20 tablets [facility]

## 2021-12-15 NOTE — Telephone Encounter (Signed)
Rx done. 
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
## 2022-01-03 ENCOUNTER — Encounter: Payer: Self-pay | Admitting: Family Medicine

## 2022-01-05 ENCOUNTER — Telehealth: Payer: Self-pay | Admitting: Allergy

## 2022-01-05 ENCOUNTER — Encounter: Payer: Self-pay | Admitting: Neurology

## 2022-01-05 NOTE — Telephone Encounter (Signed)
Patient would like to see if she needs any other shots. She going to get her flu shot. [phone]

## 2022-01-05 NOTE — Telephone Encounter (Signed)
Called patient and advised. Patient verbalized understanding.  

## 2022-01-05 NOTE — Telephone Encounter (Signed)
Per Mychart message from patient, Advised that the Ajovy needs a  Prior Approval.

## 2022-01-05 NOTE — Telephone Encounter (Signed)
Please call patient back.  Recommend annual flu vaccine.  Get new covid-19 vaccine - not available yet. Supposed to be available later this month.

## 2022-01-05 NOTE — Telephone Encounter (Signed)
Called and spoke with patient and she stated that she plans to get the COVID vaccine when available and the Flu vaccine, she wanted to know if there are any other vaccines that you think she may need to help protect her?

## 2022-01-06 NOTE — Telephone Encounter (Signed)
Submitted a Prior Authorization request to [facility] for  Ajovy  via CoverMyMeds. Will update once we receive a response.   KeyJohny Shock - PA Case ID: [id number]

## 2022-01-09 NOTE — Telephone Encounter (Signed)
Received notification from [facility] regarding a prior authorization for  Ajovy Pen . Authorization has been APPROVED from 01/06/22 to 07/07/22.   Authorization # Key: [id number] - PA Case ID: [id number]

## 2022-01-09 NOTE — Telephone Encounter (Signed)
LMOVm to advised of approval.

## 2022-01-11 ENCOUNTER — Other Ambulatory Visit (HOSPITAL_COMMUNITY): Payer: Self-pay

## 2022-01-11 NOTE — Telephone Encounter (Signed)
This encounter was opened in error, please disregard.

## 2022-01-13 ENCOUNTER — Telehealth (INDEPENDENT_AMBULATORY_CARE_PROVIDER_SITE_OTHER): Payer: 59 | Admitting: Family Medicine

## 2022-01-13 ENCOUNTER — Ambulatory Visit (HOSPITAL_COMMUNITY): Payer: Self-pay

## 2022-01-13 ENCOUNTER — Encounter: Payer: Self-pay | Admitting: Family Medicine

## 2022-01-13 VITALS — Ht 65.5 in | Wt 230.0 lb

## 2022-01-13 DIAGNOSIS — K625 Hemorrhage of anus and rectum: Secondary | ICD-10-CM | POA: Diagnosis not present

## 2022-01-13 DIAGNOSIS — K5900 Constipation, unspecified: Secondary | ICD-10-CM

## 2022-01-13 DIAGNOSIS — K6289 Other specified diseases of anus and rectum: Secondary | ICD-10-CM | POA: Diagnosis not present

## 2022-01-13 NOTE — Progress Notes (Signed)
Patient ID: Alyssa Clay, female   DOB: 1965/11/12, 55 y.o.   MRN: [id number]   Virtual Visit via Video Note  I connected with Alyssa Clay on 01/13/22 at  2:00 PM EDT by a video enabled telemedicine application and verified that I am speaking with the correct person using two identifiers.  Location patient: home Location provider:work or home office Persons participating in the virtual visit: patient, provider  I discussed the limitations of evaluation and management by telemedicine and the availability of in person appointments. The patient expressed understanding and agreed to proceed.   HPI: Alyssa Clay called with some recent perianal pain and bright red blood per rectum after straining with some constipation.  She had colonoscopy back in May 2022.  She had a couple benign hyperplastic polyps and small rectal ulcer with biopsy showing colonic mucosa with exudate and granulation tissue.  No malignancy.  She has had some recent constipation issues.  She had pain with bowel movement and little bit of pain afterwards.  Her husband looked and did not see any evidence for external hemorrhoids.  She is not aware of any prior history of anal fissure.  No known history of perianal abscess.   ROS: See pertinent positives and negatives per HPI.  Past Medical History:  Diagnosis Date   Allergy    Anemia    Asthma    Constipation    issues x the past month -? 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   Enlarged ovary 09/15/2020   noted on ct scan, per pt   FUO (fever of unknown origin) 10/21/2019   GERD (gastroesophageal reflux disease)    Hypertension    PAST hx    Migraine     Past Surgical History:  Procedure Laterality Date   APPENDECTOMY     BREAST REDUCTION SURGERY Bilateral 06/12/2019   Procedure: BREAST REDUCTION WITH LIPOSUCTION;  Surgeon: Wallace Going, DO;  Location: [place];   Service: Plastics;  Laterality: Bilateral;  4 hours, please   [place] AND CURETTAGE OF UTERUS     multiple due to miscarriages   KNEE ARTHROSCOPY Left 2013   LAPAROSCOPIC APPENDECTOMY N/A 12/08/2018   Procedure: APPENDECTOMY LAPAROSCOPIC;  Surgeon: Donnie Mesa, MD;  Location: [place];  Service: General;  Laterality: N/A;   REDUCTION MAMMAPLASTY Bilateral 2021   TENNIS ELBOW RELEASE/NIRSCHEL PROCEDURE  2009   WISDOM TOOTH EXTRACTION      Family History  Problem Relation Age of Onset   Depression Mother    Lymphoma Mother 77   Lung cancer Mother    Hypertension Father    Heart attack Father 24   Hypertension Brother    High blood pressure Brother    High Cholesterol Brother    Healthy Daughter    Colon cancer Maternal Aunt 2   Esophageal cancer Neg Hx    Pancreatic cancer Neg Hx    Stomach cancer Neg Hx    Colon polyps Neg Hx    Rectal cancer Neg Hx     SOCIAL HX: Non-smoker   Current Outpatient Medications:    albuterol (PROVENTIL) (2.5 MG/3ML) 0.083% nebulizer solution, Take 3 mLs (2.5 mg total) by nebulization every 4 (four) hours as needed for wheezing or shortness of breath (coughing fits)., Disp: 75 mL, Rfl: 2   albuterol (VENTOLIN HFA) 108 (90 Base) MCG/ACT inhaler,  Inhale 2 puffs into the lungs every 4 (four) hours as needed for wheezing or shortness of breath (coughing fits)., Disp: 18 g, Rfl: 1   budesonide (PULMICORT) 0.5 MG/2ML nebulizer solution, TAKE 2 MLS (0.5 MG TOTAL) BY NEBULIZATION IN THE MORNING AND AT BEDTIME. TAKE DURING UPPER RESPIRATORY INFECTIONS FOR 1-2 WEEKS AT A TIME., Disp: 360 mL, Rfl: 0   buPROPion (WELLBUTRIN XL) 150 MG 24 hr tablet, Take 150 mg by mouth every morning., Disp: , Rfl:    clonazePAM (KLONOPIN) 0.5 MG tablet, Take 1 tablet (0.5 mg total) by mouth daily as needed for anxiety., Disp: 90 tablet, Rfl: 1   cyclobenzaprine (FLEXERIL) 10 MG tablet, Take 10 mg by mouth 3 (three) times daily as needed (migraines).,  Disp: , Rfl:    DULoxetine (CYMBALTA) 60 MG capsule, TAKE 1 CAPSULE BY MOUTH  DAILY, Disp: 90 capsule, Rfl: 3   Estradiol (YUVAFEM) 10 MCG TABS vaginal tablet, Place 1 tablet (10 mcg total) vaginally daily., Disp: 8 tablet, Rfl: 0   fluconazole (DIFLUCAN) 150 MG tablet, Take 1 tablet (150 mg total) by mouth daily. If no improvement after 72 hours then take second pill. (Patient not taking: Reported on 12/07/2021), Disp: 2 tablet, Rfl: 0   fluticasone furoate-vilanterol (BREO ELLIPTA) 200-25 MCG/ACT AEPB, Inhale 1 puff into the lungs daily. Rinse mouth after each use., Disp: 60 each, Rfl: 5   Fremanezumab-vfrm (AJOVY) 225 MG/1.5ML SOAJ, Inject 225 mg into the skin every 28 (twenty-eight) days., Disp: 1.68 mL, Rfl: 5   gabapentin (NEURONTIN) 300 MG capsule, Take 300-900 mg by mouth See admin instructions. Take 1 tablet every AM, three tablets at bedtime, Disp: , Rfl:    Multiple Vitamin (MULTIVITAMIN WITH MINERALS) TABS tablet, Take 1 tablet by mouth daily., Disp: , Rfl:    prochlorperazine (COMPAZINE) 10 MG tablet, Take by mouth., Disp: , Rfl:    Respiratory Therapy Supplies (FLUTTER) DEVI, Use 3 times daily as directed, Disp: 1 each, Rfl: 0   rizatriptan (MAXALT) 10 MG tablet, TAKE 1 TABLET BY MOUTH ONCE AS NEEDED FOR MIGRAINE FOR UP TO 1 DOSE. MAY REPEAT IN 2 HOURS IF NEEDED, Disp: 10 tablet, Rfl: 11   rosuvastatin (CRESTOR) 5 MG tablet, TAKE 1 TABLET BY MOUTH AT  BEDTIME, Disp: 90 tablet, Rfl: 3   Semaglutide-Weight Management (WEGOVY) 1.7 MG/0.75ML SOAJ, Inject 1.7 mg into the skin once a week., Disp: 3 mL, Rfl: 2   Tezepelumab-ekko (TEZSPIRE) 210 MG/1.91ML SOAJ, Inject 210 mg into the skin every 28 (twenty-eight) days., Disp: 1.91 mL, Rfl: 11   traZODone (DESYREL) 100 MG tablet, Take 1-2 tablets (100-200 mg total) by mouth at bedtime as needed. for sleep, Disp: 20 tablet, Rfl: 0   tretinoin (RETIN-A) 0.05 % cream, Apply topically., Disp: , Rfl:    valACYclovir (VALTREX) 1000 MG tablet, Take 1  tablet (1,000 mg total) by mouth 2 (two) times daily. (Patient taking differently: Take 1,000 mg by mouth as needed.), Disp: 180 tablet, Rfl: 1  Current Facility-Administered Medications:    tezepelumab-ekko (TEZSPIRE) 210 MG/1.91ML syringe 210 mg, 210 mg, Subcutaneous, Q28 days, Garnet Sierras, DO, 210 mg at 08/19/21 1138  EXAM:  VITALS per patient if applicable:  GENERAL: alert, oriented, appears well and in no acute distress  HEENT: atraumatic, conjunttiva clear, no obvious abnormalities on inspection of external nose and ears  NECK: normal movements of the head and neck  LUNGS: on inspection no signs of respiratory distress, breathing rate appears normal, no obvious gross SOB, gasping or  wheezing  CV: no obvious cyanosis  MS: moves all visible extremities without noticeable abnormality  PSYCH/NEURO: pleasant and cooperative, no obvious depression or anxiety, speech and thought processing grossly intact  ASSESSMENT AND PLAN:  Discussed the following assessment and plan:  Perianal pain with recent constipation and pain with bowel movements.  By history, sounds like probably anal fissure.  She has not had any fever or visible swelling to suggest likely perianal abscess.  Her husband cannot see any visible external hemorrhoids.  We explained that internal hemorrhoid should not be painful.  -Discussed measures to reduce constipation with plenty of fluids, fiber, stool softener, MiraLAX if needed -Recommend sitz bath's once or twice daily for the next week -Needs office follow-up to further assess if symptoms persist -We did discuss possible topicals for pain such as nitroglycerin or diltiazem compounded with Vaseline but would like to confirm diagnosis before using     I discussed the assessment and treatment plan with the patient. The patient was provided an opportunity to ask questions and all were answered. The patient agreed with the plan and demonstrated an understanding of the  instructions.   The patient was advised to call back or seek an in-person evaluation if the symptoms worsen or if the condition fails to improve as anticipated.     Carolann Littler, MD

## 2022-01-16 ENCOUNTER — Encounter: Payer: Self-pay | Admitting: Family Medicine

## 2022-01-16 ENCOUNTER — Telehealth: Payer: Self-pay | Admitting: Neurology

## 2022-01-16 MED ORDER — LIRAGLUTIDE -WEIGHT MANAGEMENT 18 MG/3ML ~~LOC~~ SOPN: PEN_INJECTOR | SUBCUTANEOUS | 5 refills | Status: DC

## 2022-01-16 NOTE — Telephone Encounter (Signed)
Authorization has been APPROVED from 01/06/22 to 07/07/22.   Per PA team.  Patient advised and Pharmacy.

## 2022-01-16 NOTE — Telephone Encounter (Signed)
Patient called requesting an update on her Ajovy PA.

## 2022-01-17 ENCOUNTER — Telehealth: Payer: Self-pay | Admitting: *Deleted

## 2022-01-17 NOTE — Telephone Encounter (Signed)
Prior auth for Saxenda '18mg'$  sent to [email or web address]-Key: [facility] pending review by insurance.

## 2022-01-19 ENCOUNTER — Telehealth: Payer: Self-pay | Admitting: Allergy

## 2022-01-19 MED ORDER — FLUTICASONE FUROATE-VILANTEROL 200-25 MCG/ACT IN AEPB: 1.0000 | INHALATION_SPRAY | Freq: Every day | RESPIRATORY_TRACT | 5 refills | Status: DC

## 2022-01-19 NOTE — Telephone Encounter (Signed)
Patient is requesting Prescription be transferred to [facility] fluticasone furoate-vilanterol (BREO ELLIPTA) 200-25 MCG/ACT AEPB [[id number]] please advise

## 2022-01-19 NOTE — Telephone Encounter (Signed)
[facility] for breo [facility]

## 2022-02-17 ENCOUNTER — Ambulatory Visit: Payer: 59 | Admitting: Family Medicine

## 2022-02-21 ENCOUNTER — Encounter: Payer: Self-pay | Admitting: Family Medicine

## 2022-02-21 ENCOUNTER — Ambulatory Visit (INDEPENDENT_AMBULATORY_CARE_PROVIDER_SITE_OTHER): Payer: 59 | Admitting: Family Medicine

## 2022-02-21 VITALS — BP 100/64 | HR 100 | Temp 98.4°F | Ht 65.5 in | Wt 220.4 lb

## 2022-02-21 DIAGNOSIS — Z6838 Body mass index (BMI) 38.0-38.9, adult: Secondary | ICD-10-CM | POA: Diagnosis not present

## 2022-02-21 DIAGNOSIS — F3341 Major depressive disorder, recurrent, in partial remission: Secondary | ICD-10-CM | POA: Diagnosis not present

## 2022-02-21 DIAGNOSIS — H6692 Otitis media, unspecified, left ear: Secondary | ICD-10-CM

## 2022-02-21 MED ORDER — CEFDINIR 300 MG PO CAPS: 300.0000 mg | ORAL_CAPSULE | Freq: Two times a day (BID) | ORAL | 0 refills | Status: AC

## 2022-02-21 MED ORDER — WEGOVY 2.4 MG/0.75ML ~~LOC~~ SOAJ: 2.4000 mg | SUBCUTANEOUS | 11 refills | Status: DC

## 2022-02-21 NOTE — Assessment & Plan Note (Signed)
New on exam, will treat with 7 days of cefdinir 300 mg BID.

## 2022-02-21 NOTE — Assessment & Plan Note (Signed)
300 mg daily of wellbutrin and 60 mg daily of cymbalta. States that this is working well for her, she was able to get through her aunt's funeral fairly well. She is no longer on the clonazepam daily, I will remove this from her medication list.

## 2022-02-21 NOTE — Assessment & Plan Note (Signed)
Doing very well on the wegovy 1.7 mg weekly. Will increase to 2.4 mg weekly and see her back in 3 months. Pt instructed to call if she develops any side effects to the higher dose of medication.

## 2022-02-21 NOTE — Progress Notes (Signed)
Established Patient Office Visit  Subjective   Patient ID: Alyssa Clay, female    DOB: 1966/04/02  Age: 56 y.o. MRN: [id number]  Chief Complaint  Patient presents with   Follow-up    Patient is here for follow up on obesity and depression.  Obesity- patient has lost 18 pounds in the last 3 months. States that the [facility] is back in stock and she is on the 1.7 mg weekly. States she is doing well, denies any side effects, states she is using miralax in her coffee to help prevent constipation. We discussed increasing to 2.4 mg weekly and she is agreeable.   Depression-- pt is on 300 mg daily of the wellbutrin, 60 mg cymbalta daily. States that she had another death in the family since the last visit. States she is doing "ok" that she just "needs to get through it." States that she is no longer taking the clonazepam that she was given in April, states she is doing well without it.   Left ear pain-- pt reports that she has had 2-3 day history of left ear fullness and pain states she was congested in her sinuses from the crying from her aunt's funeral. Denies any fever or chills, no sore throat other other associated symptoms.    Current Outpatient Medications  Medication Instructions   Ajovy 225 mg, Subcutaneous, Every 28 days   albuterol (PROVENTIL) 2.5 mg, Nebulization, Every 4 hours PRN   albuterol (VENTOLIN HFA) 108 (90 Base) MCG/ACT inhaler 2 puffs, Inhalation, Every 4 hours PRN   budesonide (PULMICORT) 0.5 mg, Nebulization, 2 times daily, Take during upper respiratory infections for 1-2 weeks at a time.   buPROPion (WELLBUTRIN XL) 300 mg, Oral, Daily   cefdinir (OMNICEF) 300 mg, Oral, 2 times daily   cyclobenzaprine (FLEXERIL) 10 mg, Oral, 3 times daily PRN   DULoxetine (CYMBALTA) 60 MG capsule TAKE 1 CAPSULE BY MOUTH  DAILY   Estradiol (YUVAFEM) 10 mcg, Vaginal, Daily   fluconazole (DIFLUCAN) 150 mg, Oral, Daily, If no improvement after 72 hours then take second pill.    fluticasone furoate-vilanterol (BREO ELLIPTA) 200-25 MCG/ACT AEPB 1 puff, Inhalation, Daily, Rinse mouth after each use.   gabapentin (NEURONTIN) 300-900 mg, Oral, See admin instructions, Take 1 tablet every AM, three tablets at bedtime   Multiple Vitamin (MULTIVITAMIN WITH MINERALS) TABS tablet 1 tablet, Oral, Daily   prochlorperazine (COMPAZINE) 10 MG tablet Oral   Respiratory Therapy Supplies (FLUTTER) DEVI Use 3 times daily as directed   rizatriptan (MAXALT) 10 MG tablet TAKE 1 TABLET BY MOUTH ONCE AS NEEDED FOR MIGRAINE FOR UP TO 1 DOSE. MAY REPEAT IN 2 HOURS IF NEEDED   rosuvastatin (CRESTOR) 5 MG tablet TAKE 1 TABLET BY MOUTH AT  BEDTIME   Tezspire 210 mg, Subcutaneous, Every 28 days   traZODone (DESYREL) 100-200 mg, Oral, At bedtime PRN, for sleep   tretinoin (RETIN-A) 0.05 % cream Topical   valACYclovir (VALTREX) 1,000 mg, Oral, 2 times daily   Wegovy 2.4 mg, Subcutaneous, Weekly    Patient Active Problem List   Diagnosis Date Noted   Acute left otitis media 02/21/2022   Recurrent infections 11/14/2021   Vaginal yeast infection 11/14/2021   Rash and other nonspecific skin eruption 11/14/2021   Class 2 obesity with body mass index (BMI) of 38.0 to 38.9 in adult 10/19/2021   History of COVID-19 09/12/2021   Chronic migraine without aura without status migrainosus, not intractable 05/26/2021   Lumbar radiculopathy, right 10/08/2020  SI (sacroiliac) joint dysfunction 09/16/2020   Somatic dysfunction of spine, sacral 09/16/2020   OSA (obstructive sleep apnea) 12/18/2019   FUO (fever of unknown origin) 10/21/2019   S/P bilateral breast reduction 06/19/2019   Back pain 04/15/2019   Neck pain 04/15/2019   History of frequent upper respiratory infection 01/16/2019   Atelectasis 01/16/2019   Severe persistent asthma without complication 77/82/4235   Shortness of breath 01/01/2019   Primary osteoarthritis of both knees 07/10/2018   Hypertension 03/27/2018   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  HENT:  Positive for congestion and ear pain. Negative for ear discharge and sinus pain.   All other systems reviewed and are negative.     Objective:     BP 100/64 (BP Location: Left Arm, Patient Position: Sitting, Cuff Size: Large)   Pulse 100   Temp 98.4 F (36.9 C) (Oral)   Ht 5' 5.5" (1.664 m)   Wt 220 lb 6.4 oz (100 kg)   LMP 12/20/2018   SpO2 98%   BMI 36.12 kg/m  BP Readings from Last 3 Encounters:  02/21/22 100/64  12/07/21 125/82  11/17/21 118/78   Wt Readings from Last 3 Encounters:  02/21/22 220 lb 6.4 oz (100 kg)  01/13/22 230 lb (104.3 kg)  12/07/21 230 lb (104.3 kg)      Physical Exam Vitals reviewed.  Constitutional:      Appearance: Normal appearance. She is well-groomed. She is obese.  HENT:     Right Ear: Tympanic membrane normal.     Left Ear: A middle ear effusion is present. Tympanic membrane is injected.  Eyes:     Conjunctiva/sclera: Conjunctivae normal.  Cardiovascular:     Rate and Rhythm: Normal rate and regular rhythm.     Pulses: Normal pulses.     Heart sounds: S1 normal and S2 normal.  Pulmonary:     Effort: Pulmonary effort is normal.     Breath sounds: Normal breath sounds and air entry.  Abdominal:     General: Bowel sounds are normal.  Neurological:     Mental Status: She is alert and oriented to person, place, and time. Mental status is at baseline.     Gait: Gait is intact.  Psychiatric:        Mood and Affect: Mood and affect normal.        Speech: Speech normal.        Behavior: Behavior normal.        Judgment: Judgment normal.      No results  found for any visits on 02/21/22.    The 10-year ASCVD risk score (Arnett DK, et al., 2019) is: 0.9%    Assessment & Plan:   Problem List Items Addressed This Visit       Nervous and Auditory   Acute left otitis media    New on exam, will treat with 7 days of cefdinir 300 mg BID.       Relevant Medications   cefdinir (OMNICEF) 300 MG capsule     Other   Class 2 obesity with body mass index (BMI) of 38.0 to 38.9 in adult - Primary    Doing  very well on the wegovy 1.7 mg weekly. Will increase to 2.4 mg weekly and see her back in 3 months. Pt instructed to call if she develops any side effects to the higher dose of medication.       Relevant Medications   Semaglutide-Weight Management (WEGOVY) 2.4 MG/0.75ML SOAJ   Depression    300 mg daily of wellbutrin and 60 mg daily of cymbalta. States that this is working well for her, she was able to get through her aunt's funeral fairly well. She is no longer on the clonazepam daily, I will remove this from her medication list.       Relevant Medications   buPROPion (WELLBUTRIN XL) 300 MG 24 hr tablet    Return in about 3 months (around 05/24/2022) for follow up on weight loss.    Farrel Conners, MD

## 2022-02-27 ENCOUNTER — Encounter: Payer: Self-pay | Admitting: Family Medicine

## 2022-02-27 DIAGNOSIS — R110 Nausea: Secondary | ICD-10-CM

## 2022-02-27 MED ORDER — ONDANSETRON HCL 4 MG PO TABS: 4.0000 mg | ORAL_TABLET | Freq: Three times a day (TID) | ORAL | 2 refills | Status: DC | PRN

## 2022-03-22 ENCOUNTER — Ambulatory Visit: Payer: 59 | Admitting: Allergy

## 2022-04-10 NOTE — Progress Notes (Unsigned)
NEUROLOGY FOLLOW UP OFFICE NOTE  [name] [id number]  Assessment/Plan:   Migraine with aura, without status migrainosus, intractable   Migraine prevention:  Ajovy was approved.  She will pick up today.  Provided her with sample in case there is a problem. Migraine rescue:  Stop rizatriptan.  She will try samples of Trudhesa NS Limit use of pain relievers to no more than 2 days out of week to prevent risk of rebound or medication-overuse headache. Keep headache diary Follow up 4 to 5 months.        Subjective:  Alyssa Clay is a 56 year old female with asthma, depression who follows up for migraines.  UPDATE: Plan was to start Emgality.  Insurance preferred [employer].  However, she hasn't picked it up.  It was approved but her pharmacy said that the approval didn't go through.  Zavzpret ineffective.  Intensity:  moderate to severe Duration:  8 hours with Maxalt (does repeat dose) Frequency:  7-8 days in last 30 days (some due to stress) Frequency of abortive medication: as needed Current NSAIDS/analgesics:  none Current triptans:  Maxalt '10mg'$  (effective 50% of time) Current ergotamine:  none Current anti-emetic:  Compazine '10mg'$  Current muscle relaxants:  Flexeril '10mg'$  (neck and head soreness) Current Antihypertensive medications:  none Current Antidepressant medications:  Cymbalta '60mg'$  daily, Wellbutrin, trazodone Current Anticonvulsant medications:  gabapentin 300 in AM and '900mg'$  in PM Current anti-CGRP:  none Current Vitamins/Herbal/Supplements:  none Current Antihistamines/Decongestants:  none Other therapy:  none birth control:  none   Caffeine:  3 cups of coffee daily Diet:  Hydrates.  No soda Exercise:  goes to gym (2 cardio days, 2 weight lifting days, speed weight day) Depression:  yes; Anxiety:  yes - difficult year.  Several family members passed away this year. Sleep hygiene:  good with trazodone  HISTORY:  Onset:  72 or 56 years old with onset of  menses menstrual until 72 and then more often.  Pregnant at 27 -  Location:  frontal, temporal, top of head bilaterally Quality:  squeezing, stabbing Intensity:  moderate to severe Aura:  sometimes zigzag pattern in visioin Prodrome:  absent Associated symptoms:  Photophobia, phonophobia, osmophobia.  She denies associated nausea, vomiting, autonomic symptoms, unilateral numbness or weakness. Duration:  1 to 3 days Frequency:  5 to 12 a month Triggers:  bright lights, stress Relieving factors:  rest, dark room Activity:  aggravates   MRI of brain without contrast on 10/14/2012 was normal   Past NSAIDS/analgesics:  ibuprofen, Tylenol, naproxen, Fioricet, Codeine, darvocet, Norco, Tylenol #3, tramadol, Midrin, toradol Past abortive triptans:  Amerge, Frova, Relpax, sumatriptan tab/[place]rees, Zomig, Treximet Past abortive ergotamine: none Past muscle relaxants:  tizanidine, skelaxin Past anti-emetic:  promethazine Past antihypertensive medications:  propranolol, atenolol, verapamil, metoprolol Past antidepressant medications:  amitriptyline, sertraline, Lexapro, Celexa, Prozac Past anticonvulsant medications:  topiramate, Depakote, zonisamide Past anti-CGRP:  Lajean Silvius, Nurtec, Aimovig, Zavzpret Past vitamins/Herbal/Supplements:  Migrelief BID Past antihistamines/decongestants:  none Other past therapies:  Botox, sphenopalatine ganglion block, trigger point injections, dry needling    Family history of headache:  mom, siblings, aunts, cousins  PAST MEDICAL HISTORY: Past Medical History:  Diagnosis Date   Allergy    Anemia    Asthma    Constipation    issues x the past month -? 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 75 mL 2   albuterol (VENTOLIN HFA) 108 (90 Base) MCG/ACT inhaler Inhale 2 puffs into the lungs every 4 (four) hours as needed for wheezing or shortness of breath (coughing fits). 18 g 1   budesonide (PULMICORT) 0.5 MG/2ML nebulizer solution TAKE 2 MLS (0.5 MG TOTAL) BY NEBULIZATION IN THE MORNING AND AT BEDTIME. TAKE DURING UPPER RESPIRATORY INFECTIONS FOR 1-2 WEEKS AT A TIME. 360 mL 0   buPROPion (WELLBUTRIN XL) 300 MG 24 hr tablet Take 300 mg by mouth daily.     cyclobenzaprine (FLEXERIL) 10 MG tablet Take 10 mg by mouth 3 (three) times daily as needed (migraines).     DULoxetine (CYMBALTA) 60 MG capsule TAKE 1 CAPSULE BY MOUTH  DAILY 90 capsule 3   Estradiol (YUVAFEM) 10 MCG TABS vaginal tablet Place 1 tablet (10 mcg total) vaginally daily. 8 tablet 0   fluconazole (DIFLUCAN) 150 MG tablet Take 1 tablet (150 mg total) by mouth daily. If no improvement after 72 hours then take second pill. 2 tablet 0   fluticasone furoate-vilanterol (BREO ELLIPTA) 200-25 MCG/ACT AEPB Inhale 1 puff into the lungs daily. Rinse mouth after each use. 60 each 5   Fremanezumab-vfrm (AJOVY) 225 MG/1.5ML SOAJ Inject 225 mg into the skin every 28 (twenty-eight) days. 1.68 mL 5   gabapentin (NEURONTIN) 300 MG capsule Take 300-900 mg by mouth See admin instructions. Take 1 tablet every AM, three tablets at bedtime     Multiple Vitamin (MULTIVITAMIN WITH MINERALS) TABS tablet Take 1 tablet by mouth daily.     ondansetron (ZOFRAN) 4 MG tablet Take 1 tablet (4 mg total) by mouth every 8 (eight) hours as needed for nausea or vomiting. 20 tablet 2   prochlorperazine (COMPAZINE) 10 MG tablet Take by mouth.     Respiratory Therapy Supplies  (FLUTTER) DEVI Use 3 times daily as directed 1 each 0   rizatriptan (MAXALT) 10 MG tablet TAKE 1 TABLET BY MOUTH ONCE AS NEEDED FOR MIGRAINE FOR UP TO 1 DOSE. MAY REPEAT IN 2 HOURS IF NEEDED 10 tablet 11   rosuvastatin (CRESTOR) 5 MG tablet TAKE 1 TABLET BY MOUTH AT  BEDTIME 90 tablet 3   Semaglutide-Weight Management (WEGOVY) 2.4 MG/0.75ML SOAJ Inject 2.4 mg into the skin once a week. 3 mL 11   Tezepelumab-ekko (TEZSPIRE) 210 MG/1.91ML SOAJ Inject 210 mg into the skin every 28 (twenty-eight) days. 1.91 mL 11   traZODone (DESYREL) 100 MG tablet Take 1-2 tablets (100-200 mg total) by mouth at bedtime as needed. for sleep 20 tablet 0   tretinoin (RETIN-A) 0.05 % cream Apply topically.     valACYclovir (VALTREX) 1000 MG tablet Take 1 tablet (1,000 mg total) by mouth 2 (two) times daily. (Patient taking differently: Take 1,000 mg by mouth as needed.) 180 tablet 1   Current Facility-Administered Medications on File Prior to Visit  Medication Dose Route Frequency Provider Last Rate Last Admin   tezepelumab-ekko (TEZSPIRE) 210 MG/1.91ML syringe 210 mg  210 mg Subcutaneous Q28 days Garnet Sierras, DO  210 mg at 08/19/21 1138    ALLERGIES: Allergies  Allergen Reactions   Penicillins Other (See Comments)    Yeast infections Yeast infections   Pt wants listed as an allergy    Zonisamide Hives    FAMILY HISTORY: Family History  Problem Relation Age of Onset   Depression Mother    Lymphoma Mother 74   Lung cancer Mother    Hypertension Father    Heart attack Father 43   Hypertension Brother    High blood pressure Brother    High Cholesterol Brother    Healthy Daughter    Colon cancer Maternal Aunt 65   Esophageal cancer Neg Hx    Pancreatic cancer Neg Hx    Stomach cancer Neg Hx    Colon polyps Neg Hx    Rectal cancer Neg Hx       Objective:  Blood pressure 125/68, pulse (!) 101, height '5\' 5"'$  (1.651 m), weight 214 lb 9.6 oz (97.3 kg), last menstrual period 12/20/2018, SpO2 96  %. General: No acute distress.  Patient appears well-groomed.      Metta Clines, DO  CC: Loralyn Freshwater, MD

## 2022-04-11 ENCOUNTER — Ambulatory Visit (INDEPENDENT_AMBULATORY_CARE_PROVIDER_SITE_OTHER): Payer: 59 | Admitting: Neurology

## 2022-04-11 ENCOUNTER — Encounter: Payer: Self-pay | Admitting: Neurology

## 2022-04-11 VITALS — BP 125/68 | HR 101 | Ht 65.0 in | Wt 214.6 lb

## 2022-04-11 DIAGNOSIS — G43119 Migraine with aura, intractable, without status migrainosus: Secondary | ICD-10-CM

## 2022-04-11 NOTE — Patient Instructions (Signed)
Ajovy every 28 days Stop Maxalt.  At earliest onset of migraine, take Trudhesa spray - prime 4 times then 1 spray in each nostril.  May repeat with a different apparatus in one hour.  Let me know if it works Follow up 4 to 5 months.

## 2022-04-14 ENCOUNTER — Other Ambulatory Visit: Payer: Self-pay | Admitting: *Deleted

## 2022-04-18 ENCOUNTER — Encounter: Payer: Self-pay | Admitting: Family Medicine

## 2022-04-18 MED ORDER — TRAZODONE HCL 100 MG PO TABS: 100.0000 mg | ORAL_TABLET | Freq: Every evening | ORAL | 0 refills | Status: DC | PRN

## 2022-04-28 ENCOUNTER — Encounter (HOSPITAL_COMMUNITY): Payer: Self-pay

## 2022-04-28 ENCOUNTER — Ambulatory Visit (HOSPITAL_COMMUNITY)
Admission: RE | Admit: 2022-04-28 | Discharge: 2022-04-28 | Disposition: A | Discharge status: Home / Self Care | Payer: 59 | Source: Ambulatory Visit | Attending: Emergency Medicine | Admitting: Emergency Medicine

## 2022-04-28 VITALS — BP 115/76 | HR 115 | Temp 98.3°F | Resp 18

## 2022-04-28 DIAGNOSIS — J069 Acute upper respiratory infection, unspecified: Secondary | ICD-10-CM | POA: Diagnosis not present

## 2022-04-28 DIAGNOSIS — Z20822 Contact with and (suspected) exposure to covid-19: Secondary | ICD-10-CM | POA: Diagnosis not present

## 2022-04-28 DIAGNOSIS — J0140 Acute pansinusitis, unspecified: Secondary | ICD-10-CM | POA: Diagnosis not present

## 2022-04-28 LAB — POC INFLUENZA A AND B ANTIGEN (URGENT CARE ONLY)
INFLUENZA A ANTIGEN, POC: NEGATIVE
INFLUENZA B ANTIGEN, POC: NEGATIVE

## 2022-04-28 MED ORDER — PREDNISONE 20 MG PO TABS: 40.0000 mg | ORAL_TABLET | Freq: Every day | ORAL | 0 refills | Status: AC

## 2022-04-28 MED ORDER — IBUPROFEN 600 MG PO TABS: 600.0000 mg | ORAL_TABLET | Freq: Four times a day (QID) | ORAL | 0 refills | Status: DC | PRN

## 2022-04-28 MED ORDER — DOXYCYCLINE HYCLATE 100 MG PO CAPS: 100.0000 mg | ORAL_CAPSULE | Freq: Two times a day (BID) | ORAL | 0 refills | Status: AC

## 2022-04-28 MED ORDER — AEROCHAMBER MV MISC: 1 refills | Status: AC

## 2022-04-28 NOTE — ED Triage Notes (Signed)
3-day h/o headache, sinus pressure and sore throat.

## 2022-04-28 NOTE — ED Provider Notes (Signed)
HPI  SUBJECTIVE:  Alyssa Clay is a 57 y.o. female who presents with 2 days of fevers Tmax 100.6, nasal congestion, headache, maxillary sinus pain and pressure, body aches, clear/green rhinorrhea, postnasal drip and a cough starting this morning.  She reports painful swelling underneath her jaw/along her neck last night.  She went to the dentist yesterday, who did x-rays, and it was not found to be of dental origin.  No upper or lower dental pain.  No facial swelling, sore throat, wheezing or shortness of breath, nausea, vomiting, diarrhea, abdominal pain.  No known COVID, flu, RSV exposure.  She got 4 doses of the COVID-vaccine and this years flu vaccine.  She was on Levaquin in the past month for 2 lazy M.  She has been taking Sudafed, ibuprofen around-the-clock resting, and using a humidifier.  Her sinus pain and pressure is worse when she presses on her cheeks, better when she releases the pressure on her cheeks.  She took ibuprofen within 6 hours of evaluation.  She has a past medical history of hypertension, hypercholesterolemia, which resolved with exercise.  She also has a history of asthma, paralyzed diaphragm, migraines, COVID x 2, last bout in June 2023.  PCP: Clover Mealy    Past Medical History:  Diagnosis Date   Allergy    Anemia    Asthma    Constipation    issues x the past month -? 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   Enlarged ovary 09/15/2020   noted on ct scan, per pt   FUO (fever of unknown origin) 10/21/2019   GERD (gastroesophageal reflux disease)    Hypertension    PAST hx    Migraine     Past Surgical History:  Procedure Laterality Date   APPENDECTOMY     BREAST REDUCTION SURGERY Bilateral 06/12/2019   Procedure: BREAST REDUCTION WITH LIPOSUCTION;  Surgeon: Wallace Going, DO;  Location: [facility];  Service: Plastics;  Laterality: Bilateral;  4 hours,  please   [place] AND CURETTAGE OF UTERUS     multiple due to miscarriages   KNEE ARTHROSCOPY Left 2013   LAPAROSCOPIC APPENDECTOMY N/A 12/08/2018   Procedure: APPENDECTOMY LAPAROSCOPIC;  Surgeon: Donnie Mesa, MD;  Location: [facility];  Service: General;  Laterality: N/A;   REDUCTION MAMMAPLASTY Bilateral 2021   TENNIS ELBOW RELEASE/NIRSCHEL PROCEDURE  2009   WISDOM TOOTH EXTRACTION      Family History  Problem Relation Age of Onset   Depression Mother    Lymphoma Mother 32   Lung cancer Mother    Hypertension Father    Heart attack Father 56   Hypertension Brother    High blood pressure Brother    High Cholesterol Brother    Healthy Daughter    Colon cancer Maternal Aunt 56   Esophageal cancer Neg Hx    Pancreatic cancer Neg Hx    Stomach cancer Neg Hx    Colon polyps Neg Hx    Rectal cancer Neg Hx     Social History   Tobacco Use   Smoking status: Never   Smokeless tobacco: Never  Vaping Use   Vaping Use: Never used  Substance Use Topics   Alcohol use: Yes    Comment: occ 2-3 beers a month    Drug use: Never     Current Facility-Administered Medications:  tezepelumab-ekko (TEZSPIRE) 210 MG/1.91ML syringe 210 mg, 210 mg, Subcutaneous, Q28 days, Garnet Sierras, DO, 210 mg at 08/19/21 1138  Current Outpatient Medications:    doxycycline (VIBRAMYCIN) 100 MG capsule, Take 1 capsule (100 mg total) by mouth 2 (two) times daily for 10 days., Disp: 20 capsule, Rfl: 0   ibuprofen (ADVIL) 600 MG tablet, Take 1 tablet (600 mg total) by mouth every 6 (six) hours as needed., Disp: 30 tablet, Rfl: 0   predniSONE (DELTASONE) 20 MG tablet, Take 2 tablets (40 mg total) by mouth daily with breakfast for 5 days., Disp: 10 tablet, Rfl: 0   Spacer/Aero-Holding Chambers (AEROCHAMBER MV) inhaler, Use as instructed, Disp: 1 each, Rfl: 1   albuterol (PROVENTIL) (2.5 MG/3ML) 0.083% nebulizer solution, Take 3 mLs (2.5 mg total) by nebulization every 4 (four) hours as  needed for wheezing or shortness of breath (coughing fits)., Disp: 75 mL, Rfl: 2   albuterol (VENTOLIN HFA) 108 (90 Base) MCG/ACT inhaler, Inhale 2 puffs into the lungs every 4 (four) hours as needed for wheezing or shortness of breath (coughing fits)., Disp: 18 g, Rfl: 1   budesonide (PULMICORT) 0.5 MG/2ML nebulizer solution, TAKE 2 MLS (0.5 MG TOTAL) BY NEBULIZATION IN THE MORNING AND AT BEDTIME. TAKE DURING UPPER RESPIRATORY INFECTIONS FOR 1-2 WEEKS AT A TIME., Disp: 360 mL, Rfl: 0   buPROPion (WELLBUTRIN XL) 300 MG 24 hr tablet, Take 300 mg by mouth daily., Disp: , Rfl:    cyclobenzaprine (FLEXERIL) 10 MG tablet, Take 10 mg by mouth 3 (three) times daily as needed (migraines)., Disp: , Rfl:    DULoxetine (CYMBALTA) 60 MG capsule, TAKE 1 CAPSULE BY MOUTH  DAILY, Disp: 90 capsule, Rfl: 3   Estradiol (YUVAFEM) 10 MCG TABS vaginal tablet, Place 1 tablet (10 mcg total) vaginally daily., Disp: 8 tablet, Rfl: 0   fluticasone furoate-vilanterol (BREO ELLIPTA) 200-25 MCG/ACT AEPB, Inhale 1 puff into the lungs daily. Rinse mouth after each use., Disp: 60 each, Rfl: 5   Fremanezumab-vfrm (AJOVY) 225 MG/1.5ML SOAJ, Inject 225 mg into the skin every 28 (twenty-eight) days., Disp: 1.68 mL, Rfl: 5   gabapentin (NEURONTIN) 300 MG capsule, Take 300-900 mg by mouth See admin instructions. Take 1 tablet every AM, three tablets at bedtime, Disp: , Rfl:    Multiple Vitamin (MULTIVITAMIN WITH MINERALS) TABS tablet, Take 1 tablet by mouth daily., Disp: , Rfl:    ondansetron (ZOFRAN) 4 MG tablet, Take 1 tablet (4 mg total) by mouth every 8 (eight) hours as needed for nausea or vomiting., Disp: 20 tablet, Rfl: 2   prochlorperazine (COMPAZINE) 10 MG tablet, Take by mouth., Disp: , Rfl:    Respiratory Therapy Supplies (FLUTTER) DEVI, Use 3 times daily as directed, Disp: 1 each, Rfl: 0   rosuvastatin (CRESTOR) 5 MG tablet, TAKE 1 TABLET BY MOUTH AT  BEDTIME, Disp: 90 tablet, Rfl: 3   Semaglutide-Weight Management (WEGOVY)  2.4 MG/0.75ML SOAJ, Inject 2.4 mg into the skin once a week., Disp: 3 mL, Rfl: 11   Tezepelumab-ekko (TEZSPIRE) 210 MG/1.91ML SOAJ, Inject 210 mg into the skin every 28 (twenty-eight) days., Disp: 1.91 mL, Rfl: 11   traZODone (DESYREL) 100 MG tablet, Take 1-2 tablets (100-200 mg total) by mouth at bedtime as needed. for sleep, Disp: 90 tablet, Rfl: 0   tretinoin (RETIN-A) 0.05 % cream, Apply topically., Disp: , Rfl:    valACYclovir (VALTREX) 1000 MG tablet, Take 1 tablet (1,000 mg total) by mouth 2 (two) times daily. (Patient taking differently: Take 1,000 mg  by mouth as needed.), Disp: 180 tablet, Rfl: 1  Allergies  Allergen Reactions   Penicillins Other (See Comments)    Yeast infections Yeast infections   Pt wants listed as an allergy    Zonisamide Hives     ROS  As noted in HPI.   Physical Exam  BP 115/76 (BP Location: Left Arm)   Pulse (!) 115   Temp 98.3 F (36.8 C) (Oral)   Resp 18   LMP 12/20/2018   SpO2 100%   Constitutional: Well developed, well nourished, no acute distress Eyes:  EOMI, conjunctiva normal bilaterally HENT: Normocephalic, atraumatic,mucus membranes moist.  Positive maxillary, frontal sinus tenderness.  Purulent nasal congestion.  Erythematous, swollen turbinates.  Dentition normal, nontender.  No swelling under tongue.  No expressible purulent drainage from Wharton's duct. Neck: Bilateral tender cervical lymphadenopathy, worse on the right. Respiratory: Normal inspiratory effort, lungs clear bilaterally Cardiovascular: Regular tachycardia, no murmurs rubs or gallops GI: nondistended skin: No rash, skin intact Musculoskeletal: no deformities Neurologic: Alert & oriented x 3, no focal neuro deficits Psychiatric: Speech and behavior appropriate   ED Course   Medications - No data to display  Orders Placed This Encounter  Procedures   SARS CORONAVIRUS 2 (TAT 6-24 HRS) Anterior Nasal Swab    Standing Status:   Standing    Number of  Occurrences:   1   Droplet precaution    Standing Status:   Standing    Number of Occurrences:   1   POC Influenza A & B Ag (Urgent Care)    Standing Status:   Standing    Number of Occurrences:   1    Results for orders placed or performed during the hospital encounter of 04/28/22 (from the past 24 hour(s))  POC Influenza A & B Ag (Urgent Care)     Status: None   Collection Time: 04/28/22  1:50 PM  Result Value Ref Range   INFLUENZA A ANTIGEN, POC NEGATIVE NEGATIVE   INFLUENZA B ANTIGEN, POC NEGATIVE NEGATIVE   No results found.  ED Clinical Impression  1. Acute non-recurrent pansinusitis   2. Acute upper respiratory infection   3. Encounter for laboratory testing for COVID-19 virus      ED Assessment/Plan     Patient presents with an acute illness with systemic symptoms of tachycardia. Patient reports hives with penicillins.  Swelling under jaw appears to be lymphadenopathy, rather than bilateral blocked/infected submandibular glands.  There is no evidence of Ludwig's angina or other deep space infection, dental infection.  Checking COVID, flu.  Will prescribe molnupiravir if COVID is positive, Tamiflu if influenza is positive.  If both of these are negative, will prescribe doxycycline for sinusitis, prednisone 40 mg for 5 days as she does have a history of asthma although she is not wheezing yet, Tylenol/ibuprofen, Mucinex D, saline nasal irrigation.  Prescribing a spacer for her albuterol inhaler.  Follow-up with PCP as needed.  Influenza A, B-.  Plan as above.  COVID pending at the time of initial signing of this note.  Discussed labs, MDM, treatment plan, and plan for follow-up with patient.  patient agrees with plan.   Meds ordered this encounter  Medications   doxycycline (VIBRAMYCIN) 100 MG capsule    Sig: Take 1 capsule (100 mg total) by mouth 2 (two) times daily for 10 days.    Dispense:  20 capsule    Refill:  0   ibuprofen (ADVIL) 600 MG tablet    Sig:  Take  1 tablet (600 mg total) by mouth every 6 (six) hours as needed.    Dispense:  30 tablet    Refill:  0   predniSONE (DELTASONE) 20 MG tablet    Sig: Take 2 tablets (40 mg total) by mouth daily with breakfast for 5 days.    Dispense:  10 tablet    Refill:  0   Spacer/Aero-Holding Chambers (AEROCHAMBER MV) inhaler    Sig: Use as instructed    Dispense:  1 each    Refill:  1      *This clinic note was created using Dragon dictation software. Therefore, there may be occasional mistakes despite careful proofreading.  ?    Melynda Ripple, MD 04/28/22 1355

## 2022-04-28 NOTE — Discharge Instructions (Addendum)
Your influenza test was negative.  Start the doxycycline if your COVID test comes back negative.  We Will contact you if it comes back positive.  It should be back within 24 hours. 600 mg of ibuprofen, 1000 mg of Tylenol together 3 times a day as needed for pain, saline nasal irrigation with a Milta Deiters Med rinse and distilled water as often as you want, Mucinex D.  Prednisone will help with pain and swelling in your sinuses.  I have also prescribed a spacer for you to use with your inhalers.

## 2022-04-29 LAB — SARS CORONAVIRUS 2 (TAT 6-24 HRS): SARS Coronavirus 2: NEGATIVE

## 2022-05-08 ENCOUNTER — Ambulatory Visit: Payer: 59 | Admitting: Neurology

## 2022-05-09 ENCOUNTER — Encounter (HOSPITAL_BASED_OUTPATIENT_CLINIC_OR_DEPARTMENT_OTHER): Payer: Self-pay | Admitting: Emergency Medicine

## 2022-05-09 ENCOUNTER — Telehealth: Payer: Self-pay

## 2022-05-09 ENCOUNTER — Other Ambulatory Visit: Payer: Self-pay

## 2022-05-09 ENCOUNTER — Emergency Department (HOSPITAL_BASED_OUTPATIENT_CLINIC_OR_DEPARTMENT_OTHER)
Admission: EM | Admit: 2022-05-09 | Discharge: 2022-05-09 | Disposition: A | Discharge status: Home / Self Care | Payer: 59 | Attending: Emergency Medicine | Admitting: Emergency Medicine

## 2022-05-09 DIAGNOSIS — R109 Unspecified abdominal pain: Secondary | ICD-10-CM | POA: Diagnosis present

## 2022-05-09 DIAGNOSIS — R1084 Generalized abdominal pain: Secondary | ICD-10-CM

## 2022-05-09 NOTE — Discharge Instructions (Addendum)
Take 8 scoops of miralax in 32oz of whatever you would like to drink.(Gatorade comes in this size) You can also use a fleets enema which you can buy over the counter at the pharmacy.  Return for worsening abdominal pain, vomiting or fever. ? ?

## 2022-05-09 NOTE — Telephone Encounter (Signed)
Transition Care Management Follow-up Telephone Call Date of discharge and from where: 05/09/22 from [facility] for Generalized abdominal pain How have you been since you were released from the hospital? Pt was able to have a large BM approx 1pm. Has been staying hydrated with Gatorade Any questions or concerns? No  Items Reviewed: Did the pt receive and understand the discharge instructions provided? Yes  Medications obtained and verified? Yes  Do you have support at home? Yes   Follow up appointments reviewed:  PCP Hospital f/u appt confirmed? Yes  Scheduled to see Dr Legrand Como on 05/10/22 @ 1130. [facility] f/u appt confirmed?  N/a   Are transportation arrangements needed? No  If their condition worsens, is the pt aware to call PCP or go to the Emergency Dept.? Yes Was the patient provided with contact information for the PCP's office or ED? Yes Was to pt encouraged to call back with questions or concerns? Yes

## 2022-05-09 NOTE — ED Triage Notes (Signed)
Pt c/o abd pain with constipation, LBM x 1 week ago, no relief with laxatives or mag citrate, have not used enema or suppository

## 2022-05-09 NOTE — ED Provider Notes (Signed)
[facility] EMERGENCY DEPT Provider Note   CSN: [id number] Arrival date & time: 05/09/22  1004     History  Chief Complaint  Patient presents with   Abdominal Pain    Alyssa Clay is a 57 y.o. female.  57 yo F with a chief complaints of constipation.  Tells me that she had a bowel movement about a week ago.  Since then has tried MiraLAX off and on mag citrate and has not had any significant production.  Has had some nausea but no vomiting.  Diffuse crampy abdominal pain worse lower than upper.  No fevers.  No urinary symptoms.   Abdominal Pain      Home Medications Prior to Admission medications   Medication Sig Start Date End Date Taking? Authorizing Provider  albuterol (PROVENTIL) (2.5 MG/3ML) 0.083% nebulizer solution Take 3 mLs (2.5 mg total) by nebulization every 4 (four) hours as needed for wheezing or shortness of breath (coughing fits). 02/28/21   Garnet Sierras, DO  albuterol (VENTOLIN HFA) 108 (90 Base) MCG/ACT inhaler Inhale 2 puffs into the lungs every 4 (four) hours as needed for wheezing or shortness of breath (coughing fits). 07/04/21   Garnet Sierras, DO  budesonide (PULMICORT) 0.5 MG/2ML nebulizer solution TAKE 2 MLS (0.5 MG TOTAL) BY NEBULIZATION IN THE MORNING AND AT BEDTIME. TAKE DURING UPPER RESPIRATORY INFECTIONS FOR 1-2 WEEKS AT A TIME. 03/21/21   Garnet Sierras, DO  buPROPion (WELLBUTRIN XL) 150 MG 24 hr tablet Take 150 mg by mouth every morning. 02/26/22   [provider]  buPROPion (WELLBUTRIN XL) 300 MG 24 hr tablet Take 300 mg by mouth daily. 01/16/22   [provider]  cyclobenzaprine (FLEXERIL) 10 MG tablet Take 10 mg by mouth 3 (three) times daily as needed (migraines).    [provider]  DULoxetine (CYMBALTA) 60 MG capsule TAKE 1 CAPSULE BY MOUTH  DAILY 08/09/21   Koberlein, Steele Berg, MD  Estradiol (YUVAFEM) 10 MCG TABS vaginal tablet Place 1 tablet (10 mcg total) vaginally daily. 08/30/20   Koberlein, Steele Berg, MD   fluticasone furoate-vilanterol (BREO ELLIPTA) 200-25 MCG/ACT AEPB Inhale 1 puff into the lungs daily. Rinse mouth after each use. 01/19/22   Garnet Sierras, DO  Fremanezumab-vfrm (AJOVY) 225 MG/1.5ML SOAJ Inject 225 mg into the skin every 28 (twenty-eight) days. 12/12/21   Pieter Partridge, DO  gabapentin (NEURONTIN) 300 MG capsule Take 300-900 mg by mouth See admin instructions. Take 1 tablet every AM, three tablets at bedtime 01/03/18   [provider]  ibuprofen (ADVIL) 600 MG tablet Take 1 tablet (600 mg total) by mouth every 6 (six) hours as needed. 04/28/22   Melynda Ripple, MD  Multiple Vitamin (MULTIVITAMIN WITH MINERALS) TABS tablet Take 1 tablet by mouth daily.    [provider]  ondansetron (ZOFRAN) 4 MG tablet Take 1 tablet (4 mg total) by mouth every 8 (eight) hours as needed for nausea or vomiting. 02/27/22   Farrel Conners, MD  prochlorperazine (COMPAZINE) 10 MG tablet Take by mouth. 10/27/21   [provider]  Respiratory Therapy Supplies (FLUTTER) DEVI Use 3 times daily as directed 01/22/19   Mannam, Hart Robinsons, MD  rizatriptan (MAXALT) 10 MG tablet Take by mouth. 04/14/22   [provider]  rosuvastatin (CRESTOR) 5 MG tablet TAKE 1 TABLET BY MOUTH AT  BEDTIME 08/09/21   Koberlein, Steele Berg, MD  Semaglutide-Weight Management (WEGOVY) 2.4 MG/0.75ML SOAJ Inject 2.4 mg into the skin once a week. 02/21/22  Farrel Conners, MD  Spacer/Aero-Holding Chambers (AEROCHAMBER MV) inhaler Use as instructed 04/28/22   Melynda Ripple, MD  Tezepelumab-ekko (TEZSPIRE) 210 MG/1.91ML SOAJ Inject 210 mg into the skin every 28 (twenty-eight) days. 08/17/21   Garnet Sierras, DO  traZODone (DESYREL) 100 MG tablet Take 1-2 tablets (100-200 mg total) by mouth at bedtime as needed. for sleep 04/18/22   Farrel Conners, MD  tretinoin (RETIN-A) 0.05 % cream Apply topically. 03/30/20   [provider]  valACYclovir (VALTREX) 1000 MG tablet Take 1 tablet (1,000 mg total) by  mouth 2 (two) times daily. Patient taking differently: Take 1,000 mg by mouth as needed. 07/15/20   Caren Macadam, MD      Allergies    Penicillins and Zonisamide    Review of Systems   Review of Systems  Gastrointestinal:  Positive for abdominal pain.    Physical Exam Updated Vital Signs BP (!) 155/89 (BP Location: Right Arm)   Pulse (!) 106   Temp 99.6 F (37.6 C)   Resp 16   Ht '5\' 5"'$  (1.651 m)   Wt 98 kg   LMP 12/20/2018   SpO2 97%   BMI 35.94 kg/m  Physical Exam Vitals and nursing note reviewed.  Constitutional:      General: She is not in acute distress.    Appearance: She is well-developed. She is not diaphoretic.     Comments: BMI 35  HENT:     Head: Normocephalic and atraumatic.  Eyes:     Pupils: Pupils are equal, round, and reactive to light.  Cardiovascular:     Rate and Rhythm: Normal rate and regular rhythm.     Heart sounds: No murmur heard.    No friction rub. No gallop.  Pulmonary:     Effort: Pulmonary effort is normal.     Breath sounds: No wheezing or rales.  Abdominal:     General: There is no distension.     Palpations: Abdomen is soft.     Tenderness: There is abdominal tenderness.     Comments: Mild diffuse discomfort  Musculoskeletal:        General: No tenderness.     Cervical back: Normal range of motion and neck supple.  Skin:    General: Skin is warm and dry.  Neurological:     Mental Status: She is alert and oriented to person, place, and time.  Psychiatric:        Behavior: Behavior normal.     ED Results / Procedures / Treatments   Labs (all labs ordered are listed, but only abnormal results are displayed) Labs Reviewed - No data to display  EKG None  Radiology No results found.  Procedures Procedures    Medications Ordered in ED Medications - No data to display  ED Course/ Medical Decision Making/ A&P                             Medical Decision Making  57 yo F with a chief complaints of diffuse  abdominal discomfort.  Patient states that she has been constipated for the past week.  She has benign abdominal exam here.  No vomiting.  Doubt obstructive process.  Will have her do a trial of MiraLAX at home.  PCP follow-up.  10:44 AM:  I have discussed the diagnosis/risks/treatment options with the patient and family.  Evaluation and diagnostic testing in the emergency department does not suggest an emergent condition  requiring admission or immediate intervention beyond what has been performed at this time.  They will follow up with PCP. We also discussed returning to the ED immediately if new or worsening sx occur. We discussed the sx which are most concerning (e.g., sudden worsening pain, fever, inability to tolerate by mouth) that necessitate immediate return. Medications administered to the patient during their visit and any new prescriptions provided to the patient are listed below.  Medications given during this visit Medications - No data to display   The patient appears reasonably screen and/or stabilized for discharge and I doubt any other medical condition or other [facility] requiring further screening, evaluation, or treatment in the ED at this time prior to discharge.          Final Clinical Impression(s) / ED Diagnoses Final diagnoses:  Generalized abdominal pain    Rx / DC Orders ED Discharge Orders     None         Deno Etienne, DO 05/09/22 1044

## 2022-05-09 NOTE — ED Notes (Signed)
Discharge paperwork given and verbally understood. 

## 2022-05-10 ENCOUNTER — Encounter: Payer: Self-pay | Admitting: Family Medicine

## 2022-05-10 ENCOUNTER — Ambulatory Visit (INDEPENDENT_AMBULATORY_CARE_PROVIDER_SITE_OTHER): Payer: 59 | Admitting: Family Medicine

## 2022-05-10 VITALS — BP 100/68 | HR 85 | Temp 98.9°F | Ht 65.0 in | Wt 212.1 lb

## 2022-05-10 DIAGNOSIS — K5900 Constipation, unspecified: Secondary | ICD-10-CM | POA: Diagnosis not present

## 2022-05-10 NOTE — Patient Instructions (Signed)
Lactobacillus probiotic -- take at least once daily to help with bowel irregularity.

## 2022-05-10 NOTE — Progress Notes (Signed)
Established Patient Office Visit  Subjective   Patient ID: Alyssa Clay, female    DOB: 29-Feb-1966  Age: 57 y.o. MRN: [id number]  Chief Complaint  Patient presents with   Hospitalization Follow-up    Pt states she was in the ER with acute abdominal pain-- was diagnosed with acute constipation. I reviewed the note from the provider on 05/09/2022. States that she has been trying miralax with gatorade, pt reports that she has had multiple bowel movements and today she is having diarrhea today. States that she is hurting a lot still due to the abdominal cramping, some mild blood in the stool from the hard BM's initially. No fever/chills, no blood in stool.    Current Outpatient Medications  Medication Instructions   Ajovy 225 mg, Subcutaneous, Every 28 days   albuterol (PROVENTIL) 2.5 mg, Nebulization, Every 4 hours PRN   albuterol (VENTOLIN HFA) 108 (90 Base) MCG/ACT inhaler 2 puffs, Inhalation, Every 4 hours PRN   budesonide (PULMICORT) 0.5 mg, Nebulization, 2 times daily, Take during upper respiratory infections for 1-2 weeks at a time.   buPROPion (WELLBUTRIN XL) 300 mg, Oral, Daily   cyclobenzaprine (FLEXERIL) 10 mg, Oral, 3 times daily PRN   DULoxetine (CYMBALTA) 60 MG capsule TAKE 1 CAPSULE BY MOUTH  DAILY   Estradiol (YUVAFEM) 10 mcg, Vaginal, Daily   fluticasone furoate-vilanterol (BREO ELLIPTA) 200-25 MCG/ACT AEPB 1 puff, Inhalation, Daily, Rinse mouth after each use.   gabapentin (NEURONTIN) 300-900 mg, Oral, See admin instructions, Take 1 tablet every AM, three tablets at bedtime   Multiple Vitamin (MULTIVITAMIN WITH MINERALS) TABS tablet 1 tablet, Oral, Daily   ondansetron (ZOFRAN) 4 mg, Oral, Every 8 hours PRN   prochlorperazine (COMPAZINE) 10 MG tablet Oral   Respiratory Therapy Supplies (FLUTTER) DEVI Use 3 times daily as directed   rizatriptan (MAXALT) 10 MG tablet Oral   rosuvastatin (CRESTOR) 5 MG tablet TAKE 1 TABLET BY MOUTH AT  BEDTIME   Spacer/Aero-Holding  Chambers (AEROCHAMBER MV) inhaler Use as instructed   Tezspire 210 mg, Subcutaneous, Every 28 days   traZODone (DESYREL) 100-200 mg, Oral, At bedtime PRN, for sleep   tretinoin (RETIN-A) 0.05 % cream Topical   valACYclovir (VALTREX) 1,000 mg, Oral, 2 times daily   Wegovy 2.4 mg, Subcutaneous, Weekly     Patient Active Problem List   Diagnosis Date Noted   Acute left otitis media 02/21/2022   Recurrent infections 11/14/2021   Vaginal yeast infection 11/14/2021   Rash and other nonspecific skin eruption 11/14/2021   Class 2 obesity with body mass index (BMI) of 38.0 to 38.9 in adult 10/19/2021   History of COVID-19 09/12/2021   Chronic migraine without aura without status migrainosus, not intractable 05/26/2021   Lumbar radiculopathy, right 10/08/2020   SI (sacroiliac) joint dysfunction 09/16/2020   Somatic dysfunction of spine, sacral 09/16/2020   OSA (obstructive sleep apnea) 12/18/2019   FUO (fever of unknown origin) 10/21/2019   S/P bilateral breast reduction 06/19/2019   Back pain 04/15/2019   Neck pain 04/15/2019   History of frequent upper respiratory infection 01/16/2019   Atelectasis 01/16/2019   Severe persistent asthma without complication 84/13/2440   Shortness of breath 01/01/2019   Primary osteoarthritis of both knees 07/10/2018   Hypertension 03/27/2018   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     Objective:     BP 100/68 (BP Location: Left Arm, Patient Position: Sitting, Cuff Size: Large)   Pulse 85   Temp 98.9 F (37.2 C) (Oral)   Ht '5\' 5"'$  (1.651 m)   Wt 212 lb 1.6 oz (96.2 kg)   LMP 12/20/2018   SpO2 97%   BMI 35.30 kg/m     Physical Exam Vitals reviewed.  Constitutional:      Appearance: Normal appearance. She is well-groomed. She is obese.  Eyes:     Conjunctiva/sclera: Conjunctivae normal.  Cardiovascular:     Rate and Rhythm: Normal rate and regular rhythm.     Heart sounds: S1 normal and S2 normal.  Pulmonary:     Effort: Pulmonary effort is normal.  Abdominal:     General: Bowel sounds are normal. There is no distension.     Palpations: There is no mass.     Tenderness: There is no abdominal tenderness.  Neurological:     Mental Status: She is alert and oriented to person, place, and time. Mental status is at baseline.     Gait: Gait is intact.  Psychiatric:        Mood and Affect: Mood and affect normal.        Speech: Speech normal.        Behavior: Behavior normal.        Judgment: Judgment normal.      No results found for any visits on 05/10/22.    The 10-year ASCVD risk score (Arnett DK, et al., 2019) is: 0.9%    Assessment & Plan:   Problem List Items Addressed This Visit   None Visit Diagnoses     Acute constipation    -  Primary     Essentially resolved with miralax and other stool softeners, she is now having some diarrhea-- I recommended continuing her bowel regimen and she should add probiotics to her regimen, the constipation is being exacerbated by the wegovy, will see her back in 3 months for her regular follow up.   Return in about 3 months (around 08/09/2022) for ok to cancel the Jan 31st appt, .    Farrel Conners, MD

## 2022-05-25 ENCOUNTER — Other Ambulatory Visit: Payer: Self-pay | Admitting: Family Medicine

## 2022-06-06 ENCOUNTER — Encounter (HOSPITAL_COMMUNITY): Payer: Self-pay

## 2022-06-06 ENCOUNTER — Ambulatory Visit (HOSPITAL_COMMUNITY)
Admission: RE | Admit: 2022-06-06 | Discharge: 2022-06-06 | Disposition: A | Discharge status: Home / Self Care | Payer: 59 | Source: Ambulatory Visit | Attending: Emergency Medicine | Admitting: Emergency Medicine

## 2022-06-06 VITALS — BP 138/79 | HR 104 | Temp 98.6°F | Resp 18

## 2022-06-06 DIAGNOSIS — J029 Acute pharyngitis, unspecified: Secondary | ICD-10-CM | POA: Diagnosis present

## 2022-06-06 LAB — POCT RAPID STREP A, ED / UC: Streptococcus, Group A Screen (Direct): NEGATIVE

## 2022-06-06 NOTE — ED Provider Notes (Signed)
[place]    CSN: [id number] Arrival date & time: 06/06/22  1344      History   Chief Complaint Chief Complaint  Patient presents with   Sore Throat    Entered by patient   Cough   Chills    HPI Alyssa Clay is a 57 y.o. female.   57 year old female, Alyssa Clay, presents to ER with chief complaint of chills, sore throat and swollen glands since yesterday. Took tylenol earlier today. Pt had a birthday get together recently, possible illness exposure at that time.   The history is provided by the patient. No language interpreter was used.    Past Medical History:  Diagnosis Date   Allergy    Anemia    Asthma    Constipation    issues x the past month -? 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   Enlarged ovary 09/15/2020   noted on ct scan, per pt   FUO (fever of unknown origin) 10/21/2019   GERD (gastroesophageal reflux disease)    Hypertension    PAST hx    Migraine     Patient Active Problem List   Diagnosis Date Noted   Acute viral pharyngitis 06/06/2022   Acute left otitis media 02/21/2022   Recurrent infections 11/14/2021   Vaginal yeast infection 11/14/2021   Rash and other nonspecific skin eruption 11/14/2021   Class 2 obesity with body mass index (BMI) of 38.0 to 38.9 in adult 10/19/2021   History of COVID-19 09/12/2021   Chronic migraine without aura without status migrainosus, not intractable 05/26/2021   Lumbar radiculopathy, right 10/08/2020   SI (sacroiliac) joint dysfunction 09/16/2020   Somatic dysfunction of spine, sacral 09/16/2020   OSA (obstructive sleep apnea) 12/18/2019   FUO (fever of unknown origin) 10/21/2019   S/P bilateral breast reduction 06/19/2019   Back pain 04/15/2019   Neck pain 04/15/2019   History of frequent upper respiratory infection 01/16/2019   Atelectasis 01/16/2019   Severe persistent asthma without complication  [id number]   Shortness of breath 01/01/2019   Primary osteoarthritis of both knees 07/10/2018   Hypertension 03/27/2018   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/2018   Chronic rhinitis 01/07/2018   Gastroesophageal reflux disease 01/07/2018   Borderline hyperlipidemia 06/23/2013   Depression 06/23/2013   FH: CAD (coronary artery disease) 06/23/2013   Migraine 06/23/2013   Chest pain 06/23/2013    Past Surgical History:  Procedure Laterality Date   APPENDECTOMY     BREAST REDUCTION SURGERY Bilateral 06/12/2019   Procedure: BREAST REDUCTION WITH LIPOSUCTION;  Surgeon: Wallace Going, DO;  Location: [facility];  Service: Plastics;  Laterality: Bilateral;  4 hours, please   CESAREAN SECTION  1994   DILATION AND CURETTAGE OF UTERUS     multiple due to miscarriages   KNEE ARTHROSCOPY Left 2013   LAPAROSCOPIC APPENDECTOMY N/A 12/08/2018   Procedure: APPENDECTOMY LAPAROSCOPIC;  Surgeon: Donnie Mesa, MD;  Location: [place];  Service: General;  Laterality: N/A;   REDUCTION MAMMAPLASTY Bilateral 2021   TENNIS ELBOW RELEASE/NIRSCHEL PROCEDURE  2009   WISDOM TOOTH EXTRACTION      OB History     Gravida  5   Para  1   Term  1   Preterm      AB  4   Living  2  SAB  4   IAB      Ectopic      Multiple      Live Births  1            Home Medications    Prior to Admission medications   Medication Sig Start Date End Date Taking? Authorizing Provider  buPROPion (WELLBUTRIN XL) 300 MG 24 hr tablet Take 300 mg by mouth daily. 01/16/22  Yes [provider]  cyclobenzaprine (FLEXERIL) 10 MG tablet Take 10 mg by mouth 3 (three) times daily as needed (migraines).   Yes [provider]  DULoxetine (CYMBALTA) 60 MG capsule TAKE 1 CAPSULE BY MOUTH  DAILY 08/09/21  Yes Koberlein, Junell C, MD  Estradiol (YUVAFEM) 10 MCG TABS vaginal tablet Place 1 tablet (10 mcg total) vaginally daily. 08/30/20  Yes Koberlein, Junell  C, MD  fluticasone furoate-vilanterol (BREO ELLIPTA) 200-25 MCG/ACT AEPB Inhale 1 puff into the lungs daily. Rinse mouth after each use. 01/19/22  Yes Garnet Sierras, DO  Fremanezumab-vfrm (AJOVY) 225 MG/1.5ML SOAJ Inject 225 mg into the skin every 28 (twenty-eight) days. 12/12/21  Yes Jaffe, Adam R, DO  gabapentin (NEURONTIN) 300 MG capsule Take 300-900 mg by mouth See admin instructions. Take 1 tablet every AM, three tablets at bedtime 01/03/18  Yes [provider]  Multiple Vitamin (MULTIVITAMIN WITH MINERALS) TABS tablet Take 1 tablet by mouth daily.   Yes [provider]  prochlorperazine (COMPAZINE) 10 MG tablet Take by mouth. 10/27/21  Yes [provider]  Respiratory Therapy Supplies (FLUTTER) DEVI Use 3 times daily as directed 01/22/19  Yes Mannam, Praveen, MD  rizatriptan (MAXALT) 10 MG tablet Take by mouth. 04/14/22  Yes [provider]  rosuvastatin (CRESTOR) 5 MG tablet TAKE 1 TABLET BY MOUTH AT  BEDTIME 08/09/21  Yes Koberlein, Junell C, MD  Semaglutide-Weight Management (WEGOVY) 2.4 MG/0.75ML SOAJ Inject 2.4 mg into the skin once a week. 02/21/22  Yes Farrel Conners, MD  Spacer/Aero-Holding Chambers (AEROCHAMBER MV) inhaler Use as instructed 04/28/22  Yes Melynda Ripple, MD  Tezepelumab-ekko (TEZSPIRE) 210 MG/1.91ML SOAJ Inject 210 mg into the skin every 28 (twenty-eight) days. 08/17/21  Yes Garnet Sierras, DO  traZODone (DESYREL) 100 MG tablet TAKE 1 TO 2 TABLETS BY MOUTH AT  BEDTIME AS NEEDED. FOR SLEEP 05/26/22  Yes Farrel Conners, MD  tretinoin (RETIN-A) 0.05 % cream Apply topically. 03/30/20  Yes [provider]  albuterol (PROVENTIL) (2.5 MG/3ML) 0.083% nebulizer solution Take 3 mLs (2.5 mg total) by nebulization every 4 (four) hours as needed for wheezing or shortness of breath (coughing fits). 02/28/21   Garnet Sierras, DO  albuterol (VENTOLIN HFA) 108 (90 Base) MCG/ACT inhaler Inhale 2 puffs into the lungs every 4 (four) hours as needed for  wheezing or shortness of breath (coughing fits). 07/04/21   Garnet Sierras, DO  budesonide (PULMICORT) 0.5 MG/2ML nebulizer solution TAKE 2 MLS (0.5 MG TOTAL) BY NEBULIZATION IN THE MORNING AND AT BEDTIME. TAKE DURING UPPER RESPIRATORY INFECTIONS FOR 1-2 WEEKS AT A TIME. 03/21/21   Garnet Sierras, DO  ondansetron (ZOFRAN) 4 MG tablet Take 1 tablet (4 mg total) by mouth every 8 (eight) hours as needed for nausea or vomiting. 02/27/22   Farrel Conners, MD  valACYclovir (VALTREX) 1000 MG tablet Take 1 tablet (1,000 mg total) by mouth 2 (two) times daily. Patient taking differently: Take 1,000 mg by mouth as needed. 07/15/20   Caren Macadam, MD    Family  History Family History  Problem Relation Age of Onset   Depression Mother    Lymphoma Mother 46   Lung cancer Mother    Hypertension Father    Heart attack Father 14   Hypertension Brother    High blood pressure Brother    High Cholesterol Brother    Healthy Daughter    Colon cancer Maternal Aunt 11   Esophageal cancer Neg Hx    Pancreatic cancer Neg Hx    Stomach cancer Neg Hx    Colon polyps Neg Hx    Rectal cancer Neg Hx     Social History Social History   Tobacco Use   Smoking status: Never   Smokeless tobacco: Never  Vaping Use   Vaping Use: Never used  Substance Use Topics   Alcohol use: Yes    Comment: occ 2-3 beers a month    Drug use: Never     Allergies   Penicillins and Zonisamide   Review of Systems Review of Systems  Constitutional:  Positive for chills. Negative for fever.  HENT:  Positive for sore throat.   All other systems reviewed and are negative.    Physical Exam Triage Vital Signs ED Triage Vitals  Enc Vitals Group     BP 06/06/22 1410 138/79     Pulse Rate 06/06/22 1410 (!) 104     Resp 06/06/22 1410 18     Temp 06/06/22 1410 98.6 F (37 C)     Temp Source 06/06/22 1410 Oral     SpO2 06/06/22 1410 95 %     Weight --      Height --      Head Circumference --      Peak Flow --       Pain Score 06/06/22 1407 10     Pain Loc --      Pain Edu? --      Excl. in [employer]? --    No data found.  Updated Vital Signs BP 138/79 (BP Location: Left Arm)   Pulse (!) 104   Temp 98.6 F (37 C) (Oral)   Resp 18   LMP 12/20/2018   SpO2 95%   Visual Acuity Right Eye Distance:   Left Eye Distance:   Bilateral Distance:    Right Eye Near:   Left Eye Near:    Bilateral Near:     Physical Exam Vitals and nursing note reviewed.  Constitutional:      General: She is not in acute distress.    Appearance: She is well-developed.  HENT:     Head: Normocephalic and atraumatic.     Mouth/Throat:     Lips: Pink.     Mouth: Mucous membranes are moist.     Pharynx: Oropharynx is clear. Uvula midline. No oropharyngeal exudate.     Tonsils: No tonsillar exudate or tonsillar abscesses.  Eyes:     General: Lids are normal. Vision grossly intact.     Extraocular Movements: Extraocular movements intact.     Conjunctiva/sclera: Conjunctivae normal.     Pupils: Pupils are equal, round, and reactive to light.  Neck:     Trachea: Trachea normal.  Cardiovascular:     Rate and Rhythm: Normal rate and regular rhythm.     Pulses: Normal pulses.     Heart sounds: Normal heart sounds. No murmur heard. Pulmonary:     Effort: Pulmonary effort is normal. No respiratory distress.     Breath sounds: Normal breath sounds and air entry.  Abdominal:     Palpations: Abdomen is soft.     Tenderness: There is no abdominal tenderness.  Musculoskeletal:        General: No swelling.     Cervical back: Normal range of motion and neck supple.  Skin:    General: Skin is warm and dry.     Capillary Refill: Capillary refill takes less than 2 seconds.  Neurological:     General: No focal deficit present.     Mental Status: She is alert and oriented to person, place, and time.     GCS: GCS eye subscore is 4. GCS verbal subscore is 5. GCS motor subscore is 6.  Psychiatric:        Attention and Perception:  Attention normal.        Mood and Affect: Mood normal.        Speech: Speech normal.        Behavior: Behavior normal.      UC Treatments / Results  Labs (all labs ordered are listed, but only abnormal results are displayed) Labs Reviewed  CULTURE, GROUP A STREP [facility])  POCT RAPID STREP A, ED / UC  POCT RAPID STREP A, ED / UC    EKG   Radiology No results found.  Procedures Procedures (including critical care time)  Medications Ordered in UC Medications - No data to display  Initial Impression / Assessment and Plan / UC Course  I have reviewed the triage vital signs and the nursing notes.  Pertinent labs & imaging results that were available during my care of the patient were reviewed by me and considered in my medical decision making (see chart for details).     Ddx: Viral illness, allergies Final Clinical Impressions(s) / UC Diagnoses   Final diagnoses:  Acute viral pharyngitis     Discharge Instructions      Rest,push fluids, take meds as directed. Throat culture is pending. Follow up with PCP. Return as needed.     ED Prescriptions   None    PDMP not reviewed this encounter.   Tori Milks, NP [id number]

## 2022-06-06 NOTE — Discharge Instructions (Addendum)
Rest,push fluids, take meds as directed. Throat culture is pending. Follow up with PCP. Return as needed.

## 2022-06-06 NOTE — ED Triage Notes (Signed)
Pt states yesterday she had chills. Today she woke up with a sore throat, and swollen glands. She did take tylenol today.

## 2022-06-09 ENCOUNTER — Ambulatory Visit (INDEPENDENT_AMBULATORY_CARE_PROVIDER_SITE_OTHER): Payer: 59 | Admitting: Family Medicine

## 2022-06-09 ENCOUNTER — Encounter: Payer: Self-pay | Admitting: Family Medicine

## 2022-06-09 VITALS — BP 92/60 | HR 70 | Temp 98.1°F | Ht 65.5 in | Wt 214.9 lb

## 2022-06-09 DIAGNOSIS — J029 Acute pharyngitis, unspecified: Secondary | ICD-10-CM

## 2022-06-09 DIAGNOSIS — R6889 Other general symptoms and signs: Secondary | ICD-10-CM

## 2022-06-09 LAB — POCT INFLUENZA A/B
Influenza A, POC: NEGATIVE
Influenza B, POC: NEGATIVE

## 2022-06-09 LAB — POCT RAPID STREP A (OFFICE): Rapid Strep A Screen: NEGATIVE

## 2022-06-09 LAB — CULTURE, GROUP A STREP (THRC)

## 2022-06-09 LAB — POC COVID19 BINAXNOW: SARS Coronavirus 2 Ag: NEGATIVE

## 2022-06-09 MED ORDER — METHYLPREDNISOLONE 4 MG PO TBPK: ORAL_TABLET | ORAL | 0 refills | Status: DC

## 2022-06-09 NOTE — Progress Notes (Signed)
Acute Office Visit  Subjective:     Patient ID: Alyssa Clay, female    DOB: 1965/07/09, 57 y.o.   MRN: [id number]  Chief Complaint  Patient presents with   Sore Throat    X5 days, tried tea, honey and ice cream with some relief   Cough    Productive with occasional yellow-green sputum x5 days   Fever    Temperature of 101 degrees x2 days, taking Motrin     HPI Patient is in today for URI symptoms. States she went to urgent care on Monday, her testing for strep was negative at that time and is negative today. Pt reports she has a lot of pain with swallowing and continues to have throat pain, no nasal congestion, no ear pain, no wheezing, states there is a little coughing involved with some thick yellowish mucus.   Review of Systems  All other systems reviewed and are negative.       Objective:    BP 92/60 (BP Location: Left Arm, Patient Position: Sitting, Cuff Size: Large)   Pulse 70   Temp 98.1 F (36.7 C) (Oral)   Ht 5' 5.5" (1.664 m)   Wt 214 lb 14.4 oz (97.5 kg)   LMP 12/20/2018   SpO2 96%   BMI 35.22 kg/m    Physical Exam Constitutional:      Appearance: She is well-developed. She is obese.  HENT:     Right Ear: Tympanic membrane normal.     Left Ear: Tympanic membrane normal.     Nose: No congestion.     Mouth/Throat:     Mouth: Mucous membranes are moist.     Pharynx: Pharyngeal swelling and posterior oropharyngeal erythema present. No oropharyngeal exudate.  Eyes:     Conjunctiva/sclera: Conjunctivae normal.  Cardiovascular:     Rate and Rhythm: Normal rate and regular rhythm.     Heart sounds: Normal heart sounds.  Pulmonary:     Effort: Pulmonary effort is normal.     Breath sounds: Normal breath sounds. No wheezing.  Lymphadenopathy:     Cervical: No cervical adenopathy.  Neurological:     Mental Status: She is alert.     Results for orders placed or performed in visit on 06/09/22  POC COVID-19 BinaxNow  Result Value Ref Range    SARS Coronavirus 2 Ag Negative Negative  POCT Influenza A/B  Result Value Ref Range   Influenza A, POC Negative Negative   Influenza B, POC Negative Negative  POCT rapid strep A  Result Value Ref Range   Rapid Strep A Screen Negative Negative        Assessment & Plan:   Problem List Items Addressed This Visit       Unprioritized   Acute viral pharyngitis   Relevant Medications   methylPREDNISolone (MEDROL DOSEPAK) 4 MG TBPK tablet   Other Visit Diagnoses     Flu-like symptoms    -  Primary   Relevant Orders   POC COVID-19 BinaxNow (Completed)   POCT Influenza A/B (Completed)   POCT rapid strep A (Completed)      Strep test negative x 2, I reassured patient that it is likely a viral illness and I will rx medrol dose pak to help reduce the swelling/inflammation in her throat, she should continue salt water gargles and throat lozenges OTC. Meds ordered this encounter  Medications   methylPREDNISolone (MEDROL DOSEPAK) 4 MG TBPK tablet    Sig: Take package as directed  Dispense:  21 each    Refill:  0    No follow-ups on file.  Farrel Conners, MD

## 2022-06-17 ENCOUNTER — Other Ambulatory Visit: Payer: Self-pay | Admitting: Allergy

## 2022-06-20 ENCOUNTER — Telehealth: Payer: Self-pay | Admitting: *Deleted

## 2022-06-20 DIAGNOSIS — Z6838 Body mass index (BMI) 38.0-38.9, adult: Secondary | ICD-10-CM

## 2022-06-20 NOTE — Telephone Encounter (Signed)
[employer] faxed a prior auth request for [facility] 2.'4mg'$  inj.  PA was sent to [email or web address]- [id number] pending review by insurance.

## 2022-06-21 ENCOUNTER — Encounter: Payer: 59 | Admitting: Family Medicine

## 2022-06-21 ENCOUNTER — Other Ambulatory Visit: Payer: Self-pay | Admitting: *Deleted

## 2022-06-21 DIAGNOSIS — E785 Hyperlipidemia, unspecified: Secondary | ICD-10-CM

## 2022-06-21 MED ORDER — ROSUVASTATIN CALCIUM 5 MG PO TABS: 5.0000 mg | ORAL_TABLET | Freq: Every day | ORAL | 0 refills | Status: DC

## 2022-06-22 ENCOUNTER — Encounter: Payer: Self-pay | Admitting: Family Medicine

## 2022-06-22 DIAGNOSIS — B002 Herpesviral gingivostomatitis and pharyngotonsillitis: Secondary | ICD-10-CM

## 2022-06-22 MED ORDER — VALACYCLOVIR HCL 1 G PO TABS: 1000.0000 mg | ORAL_TABLET | Freq: Two times a day (BID) | ORAL | 5 refills | Status: AC

## 2022-06-23 NOTE — Telephone Encounter (Signed)
Fax received from [facility] stating the request was denied and given to PCP for review.

## 2022-06-26 MED ORDER — WEGOVY 1.7 MG/0.75ML ~~LOC~~ SOAJ: 1.7000 mg | SUBCUTANEOUS | 11 refills | Status: DC

## 2022-06-26 NOTE — Telephone Encounter (Signed)
Ok I sent in refills of the 1.7 mg weekly dose which is was she was on before.

## 2022-06-26 NOTE — Telephone Encounter (Signed)
Left a detailed message with the information below at the patient's cell number. 

## 2022-06-27 ENCOUNTER — Encounter: Payer: Self-pay | Admitting: Family Medicine

## 2022-06-27 ENCOUNTER — Ambulatory Visit (INDEPENDENT_AMBULATORY_CARE_PROVIDER_SITE_OTHER): Payer: 59 | Admitting: Family Medicine

## 2022-06-27 VITALS — BP 118/68 | HR 93 | Temp 98.4°F | Ht 65.75 in | Wt 211.8 lb

## 2022-06-27 DIAGNOSIS — Z0000 Encounter for general adult medical examination without abnormal findings: Secondary | ICD-10-CM

## 2022-06-27 DIAGNOSIS — F3341 Major depressive disorder, recurrent, in partial remission: Secondary | ICD-10-CM

## 2022-06-27 DIAGNOSIS — I10 Essential (primary) hypertension: Secondary | ICD-10-CM

## 2022-06-27 DIAGNOSIS — R7303 Prediabetes: Secondary | ICD-10-CM | POA: Diagnosis not present

## 2022-06-27 DIAGNOSIS — Z1231 Encounter for screening mammogram for malignant neoplasm of breast: Secondary | ICD-10-CM | POA: Diagnosis not present

## 2022-06-27 DIAGNOSIS — E785 Hyperlipidemia, unspecified: Secondary | ICD-10-CM | POA: Diagnosis not present

## 2022-06-27 LAB — COMPREHENSIVE METABOLIC PANEL
ALT: 12 U/L (ref 0–35)
AST: 16 U/L (ref 0–37)
Albumin: 4 g/dL (ref 3.5–5.2)
Alkaline Phosphatase: 57 U/L (ref 39–117)
BUN: 11 mg/dL (ref 6–23)
CO2: 28 mEq/L (ref 19–32)
Calcium: 9.5 mg/dL (ref 8.4–10.5)
Chloride: 102 mEq/L (ref 96–112)
Creatinine, Ser: 1 mg/dL (ref 0.40–1.20)
GFR: 63.91 mL/min (ref >60.00)
Glucose, Bld: 79 mg/dL (ref 70–99)
Potassium: 4.1 mEq/L (ref 3.5–5.1)
Sodium: 137 mEq/L (ref 135–145)
Total Bilirubin: 0.5 mg/dL (ref 0.2–1.2)
Total Protein: 6.7 g/dL (ref 6.0–8.3)

## 2022-06-27 LAB — LIPID PANEL
Cholesterol: 168 mg/dL (ref 0–200)
HDL: 63.7 mg/dL (ref >39.00)
LDL Cholesterol: 82 mg/dL (ref 0–99)
NonHDL: 104.18
Total CHOL/HDL Ratio: 3
Triglycerides: 110 mg/dL (ref 0.0–149.0)
VLDL: 22 mg/dL (ref 0.0–40.0)

## 2022-06-27 LAB — HEMOGLOBIN A1C: Hgb A1c MFr Bld: 5.4 % (ref 4.6–6.5)

## 2022-06-27 MED ORDER — BUPROPION HCL ER (XL) 150 MG PO TB24: 150.0000 mg | ORAL_TABLET | Freq: Every day | ORAL | 3 refills | Status: DC

## 2022-06-27 MED ORDER — DULOXETINE HCL 60 MG PO CPEP: 60.0000 mg | ORAL_CAPSULE | Freq: Every day | ORAL | 3 refills | Status: DC

## 2022-06-27 NOTE — Progress Notes (Signed)
Complete physical exam  Patient: Alyssa Clay   DOB: 1965-06-12   57 y.o. Female  MRN: [id number]  Subjective:    Chief Complaint  Patient presents with   Annual Exam    Alyssa Clay is a 57 y.o. female who presents today for a complete physical exam. She reports consuming a general diet. Gym/ health club routine includes cardio and light weights. She generally feels well. She reports sleeping well. She does not have additional problems to discuss today.    Most recent fall risk assessment:    04/11/2022   10:04 AM  Fall Risk   Falls in the past year? 0  Number falls in past yr: 0  Injury with Fall? 0     Most recent depression screenings:    06/27/2022   10:54 AM 05/10/2022   11:32 AM  PHQ 2/9 Scores  PHQ - 2 Score 0 0  PHQ- 9 Score 0 0    Vision:Within last year and Dental: No current dental problems and Receives regular dental care  Patient Active Problem List   Diagnosis Date Noted   Acute viral pharyngitis 06/06/2022   Acute left otitis media 02/21/2022   Recurrent infections 11/14/2021   Vaginal yeast infection 11/14/2021   Rash and other nonspecific skin eruption 11/14/2021   Class 2 obesity with body mass index (BMI) of 38.0 to 38.9 in adult 10/19/2021   History of COVID-19 09/12/2021   Chronic migraine without aura without status migrainosus, not intractable 05/26/2021   Lumbar radiculopathy, right 10/08/2020   SI (sacroiliac) joint dysfunction 09/16/2020   Somatic dysfunction of spine, sacral 09/16/2020   OSA (obstructive sleep apnea) 12/18/2019   FUO (fever of unknown origin) 10/21/2019   S/P bilateral breast reduction 06/19/2019   Back pain 04/15/2019   Neck pain 04/15/2019   History of frequent upper respiratory infection 01/16/2019   Atelectasis 01/16/2019   Severe persistent asthma without complication [id number]   Shortness of breath 01/01/2019   Primary osteoarthritis of both knees 07/10/2018   Hypertension 03/27/2018   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/2018   Chronic rhinitis 01/07/2018   Gastroesophageal reflux disease 01/07/2018   Borderline hyperlipidemia 06/23/2013   Depression 06/23/2013   FH: CAD (coronary artery disease) 06/23/2013   Migraine 06/23/2013   Chest pain 06/23/2013      Patient Care Team: Farrel Conners, MD as PCP - General (Family Medicine) Sherlyn Hay, DO as Consulting Physician (Obstetrics and Gynecology) Marny Lowenstein, PA-C (Inactive) as Physician Assistant (Emergency Medicine) Bobbitt, Sedalia Muta, MD as Consulting Physician (Allergy and Immunology) Kathrene Alu (Physician Assistant) Garnet Sierras, DO as Consulting Physician (Allergy) Tommy Medal, Lavell Islam, MD as Consulting Physician (Infectious Diseases) Pieter Partridge, DO as Consulting Physician (Neurology)   Outpatient Medications Prior to Visit  Medication Sig   albuterol (PROVENTIL) (2.5 MG/3ML) 0.083% nebulizer solution Take 3 mLs (2.5 mg total) by nebulization every 4 (four) hours as needed for wheezing or shortness of breath (coughing fits).   albuterol (VENTOLIN HFA) 108 (90 Base) MCG/ACT inhaler Inhale 2 puffs into the lungs every 4 (four) hours as needed for wheezing or shortness of breath (coughing fits).   budesonide (PULMICORT) 0.5 MG/2ML nebulizer solution TAKE 2 MLS (0.5 MG TOTAL) BY NEBULIZATION IN THE MORNING AND AT BEDTIME. TAKE DURING UPPER RESPIRATORY INFECTIONS FOR 1-2 WEEKS AT A TIME.   cyclobenzaprine (FLEXERIL) 10 MG tablet Take 10 mg by mouth 3 (three) times daily as  needed (migraines).   Estradiol (YUVAFEM) 10 MCG TABS vaginal tablet Place 1 tablet (10 mcg total) vaginally daily.   fluticasone furoate-vilanterol (BREO ELLIPTA) 200-25 MCG/ACT AEPB Inhale 1 puff into the lungs daily. Rinse mouth after each use.   Fremanezumab-vfrm (AJOVY) 225 MG/1.5ML SOAJ Inject 225 mg into the skin every 28 (twenty-eight) days.   gabapentin (NEURONTIN) 300 MG capsule Take  300-900 mg by mouth See admin instructions. Take 1 tablet every AM, three tablets at bedtime   Multiple Vitamin (MULTIVITAMIN WITH MINERALS) TABS tablet Take 1 tablet by mouth daily.   ondansetron (ZOFRAN) 4 MG tablet Take 1 tablet (4 mg total) by mouth every 8 (eight) hours as needed for nausea or vomiting.   Respiratory Therapy Supplies (FLUTTER) DEVI Use 3 times daily as directed   rizatriptan (MAXALT) 10 MG tablet Take by mouth.   rosuvastatin (CRESTOR) 5 MG tablet Take 1 tablet (5 mg total) by mouth at bedtime.   Semaglutide-Weight Management (WEGOVY) 1.7 MG/0.75ML SOAJ Inject 1.7 mg into the skin once a week.   Spacer/Aero-Holding Chambers (AEROCHAMBER MV) inhaler Use as instructed   Tezepelumab-ekko (TEZSPIRE) 210 MG/1.91ML SOAJ Inject 210 mg into the skin every 28 (twenty-eight) days.   traZODone (DESYREL) 100 MG tablet TAKE 1 TO 2 TABLETS BY MOUTH AT  BEDTIME AS NEEDED. FOR SLEEP   tretinoin (RETIN-A) 0.05 % cream Apply topically.   valACYclovir (VALTREX) 1000 MG tablet Take 1 tablet (1,000 mg total) by mouth 2 (two) times daily.   [DISCONTINUED] buPROPion (WELLBUTRIN XL) 300 MG 24 hr tablet Take 300 mg by mouth daily.   [DISCONTINUED] DULoxetine (CYMBALTA) 60 MG capsule TAKE 1 CAPSULE BY MOUTH  DAILY   [DISCONTINUED] methylPREDNISolone (MEDROL DOSEPAK) 4 MG TBPK tablet Take package as directed   [DISCONTINUED] prochlorperazine (COMPAZINE) 10 MG tablet Take by mouth.   Facility-Administered Medications Prior to Visit  Medication Dose Route Frequency Provider   tezepelumab-ekko (TEZSPIRE) 210 MG/1.91ML syringe 210 mg  210 mg Subcutaneous Q28 days Garnet Sierras, DO    Review of Systems  HENT:  Negative for hearing loss.   Eyes:  Negative for blurred vision.  Respiratory:  Negative for shortness of breath.   Cardiovascular:  Negative for chest pain.  Gastrointestinal: Negative.   Genitourinary: Negative.   Musculoskeletal:  Negative for back pain.  Neurological:  Negative for  headaches.  Psychiatric/Behavioral:  Negative for depression.   All other systems reviewed and are negative.         Objective:     BP 118/68 (BP Location: Left Arm, Patient Position: Sitting, Cuff Size: Large)   Pulse 93   Temp 98.4 F (36.9 C) (Oral)   Ht 5' 5.75" (1.67 m)   Wt 211 lb 12.8 oz (96.1 kg)   LMP 12/20/2018   SpO2 100%   BMI 34.45 kg/m    Physical Exam Vitals reviewed.  Constitutional:      Appearance: Normal appearance. She is well-groomed. She is obese.  HENT:     Right Ear: Tympanic membrane normal.     Left Ear: Tympanic membrane normal.     Mouth/Throat:     Mouth: Mucous membranes are moist.     Pharynx: No posterior oropharyngeal erythema.  Eyes:     Conjunctiva/sclera: Conjunctivae normal.  Neck:     Thyroid: No thyromegaly.  Cardiovascular:     Rate and Rhythm: Normal rate and regular rhythm.     Pulses: Normal pulses.     Heart sounds: S1 normal and S2 normal.  Pulmonary:     Effort: Pulmonary effort is normal.     Breath sounds: Normal breath sounds and air entry.  Abdominal:     General: Bowel sounds are normal.  Musculoskeletal:     Right lower leg: No edema.     Left lower leg: No edema.  Neurological:     Mental Status: She is alert and oriented to person, place, and time. Mental status is at baseline.     Gait: Gait is intact.  Psychiatric:        Mood and Affect: Mood and affect normal.        Speech: Speech normal.        Behavior: Behavior normal.        Judgment: Judgment normal.      No results found for any visits on 06/27/22.     Assessment & Plan:    Routine Health Maintenance and Physical Exam  Immunization History  Administered Date(s) Administered   Influenza,inj,Quad PF,6+ Mos 01/22/2013, 01/20/2019, 01/19/2020, 02/02/2021   Influenza-Unspecified 01/31/2022   Moderna Covid-19 Vaccine Bivalent Booster 58yr & up 01/31/2022   PFIZER(Purple Top)SARS-COV-2 Vaccination 07/12/2019, 08/02/2019, 02/15/2020    PNEUMOCOCCAL CONJUGATE-20 02/02/2021   Pfizer Covid-19 Vaccine Bivalent Booster 122yr& up 03/14/2021   Pneumococcal Conjugate-13 01/24/2018   Pneumococcal Polysaccharide-23 10/21/2019   Tdap 02/25/2020   Zoster Recombinat (Shingrix) 02/25/2020, 08/22/2021    Health Maintenance  Topic Date Due   COVID-19 Vaccine (6 - 2023-24 season) 07/13/2022 (Originally 03/28/2022)   PAP SMEAR-Modifier  08/17/2023   MAMMOGRAM  11/01/2023   DTaP/Tdap/Td (2 - Td or Tdap) 02/24/2030   COLONOSCOPY (Pts 45-4948yrnsurance coverage will need to be confirmed)  09/16/2030   INFLUENZA VACCINE  Completed   Hepatitis C Screening  Completed   HIV Screening  Completed   Zoster Vaccines- Shingrix  Completed   HPV VACCINES  Aged Out    Discussed health benefits of physical activity, and encouraged her to engage in regular exercise appropriate for her age and condition.  Problem List Items Addressed This Visit       Unprioritized   Hypertension   Relevant Orders   CMP   Borderline hyperlipidemia   Relevant Orders   Lipid Panel   Depression   Relevant Medications   buPROPion (WELLBUTRIN XL) 150 MG 24 hr tablet   DULoxetine (CYMBALTA) 60 MG capsule   Other Visit Diagnoses     Routine history and physical examination of adult    -  Primary Normal physical exam findings today, she is due for her mammogram in July, orders placed. She is also due for her annual bloodwork. I have used her previous diagnoses to order the testing only. No other problems were evaluated today.    Prediabetes       Relevant Orders   Hemoglobin A1c   Breast cancer screening by mammogram       Relevant Orders   MM Digital Screening      Return in about 3 months (around 09/27/2022) for ok to cancel April appt.     BarFarrel ConnersD

## 2022-06-30 ENCOUNTER — Other Ambulatory Visit (HOSPITAL_COMMUNITY): Payer: Self-pay

## 2022-07-05 ENCOUNTER — Other Ambulatory Visit (HOSPITAL_COMMUNITY): Payer: Self-pay

## 2022-07-17 ENCOUNTER — Other Ambulatory Visit: Payer: Self-pay | Admitting: *Deleted

## 2022-07-17 DIAGNOSIS — F3341 Major depressive disorder, recurrent, in partial remission: Secondary | ICD-10-CM

## 2022-07-17 MED ORDER — BUPROPION HCL ER (XL) 150 MG PO TB24: 150.0000 mg | ORAL_TABLET | Freq: Every day | ORAL | 0 refills | Status: DC

## 2022-07-17 NOTE — Telephone Encounter (Signed)
Rx done. 

## 2022-07-18 ENCOUNTER — Encounter: Payer: Self-pay | Admitting: Family Medicine

## 2022-08-03 ENCOUNTER — Ambulatory Visit (INDEPENDENT_AMBULATORY_CARE_PROVIDER_SITE_OTHER): Payer: 59 | Admitting: Adult Health

## 2022-08-03 ENCOUNTER — Encounter: Payer: Self-pay | Admitting: Adult Health

## 2022-08-03 VITALS — BP 110/62 | HR 72 | Temp 98.4°F | Ht 65.75 in | Wt 209.0 lb

## 2022-08-03 DIAGNOSIS — B349 Viral infection, unspecified: Secondary | ICD-10-CM

## 2022-08-03 DIAGNOSIS — R42 Dizziness and giddiness: Secondary | ICD-10-CM

## 2022-08-03 LAB — POCT RAPID STREP A (OFFICE): Rapid Strep A Screen: NEGATIVE

## 2022-08-03 LAB — POC COVID19 BINAXNOW: SARS Coronavirus 2 Ag: NEGATIVE

## 2022-08-03 LAB — POCT INFLUENZA A/B
Influenza A, POC: NEGATIVE
Influenza B, POC: NEGATIVE

## 2022-08-03 MED ORDER — MECLIZINE HCL 25 MG PO TABS: 25.0000 mg | ORAL_TABLET | Freq: Three times a day (TID) | ORAL | 0 refills | Status: DC | PRN

## 2022-08-03 NOTE — Progress Notes (Signed)
Subjective:    Patient ID: Alyssa Clay, female    DOB: November 23, 1965, 57 y.o.   MRN: [id number]  HPI 57 year old female who  has a past medical history of Allergy, Anemia, Asthma, Constipation, Depression, Diaphragm paralysis, Enlarged ovary (09/15/2020), FUO (fever of unknown origin) (10/21/2019), GERD (gastroesophageal reflux disease), Hypertension, and Migraine.  She presents to the office today for an acute issue. She reports that her symptoms may have started about 4 days ago. Symptoms include that of sore throat ( feels better) , headache,  subjective low grade fever, and feeling dizzy.Dizziness started about two days ago and  feels like the world is spinning around her, changes in position cause the dizziness.  No n/v/d/loss of appetite.  She was taking Allegra for 4 days and then stopped it. This seemed to help with her sore troat     Review of Systems See HPI   Past Medical History:  Diagnosis Date   Allergy    Anemia    Asthma    Constipation    issues x the past month -? 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  Tobacco Use   Smoking status: Never   Smokeless tobacco: Never  Vaping Use   Vaping Use: Never used  Substance and Sexual Activity   Alcohol use: Yes    Comment: occ 2-3 beers a month    Drug use: Never   Sexual activity: Yes    Partners: Male  Other Topics Concern   Not on file  Social History Narrative   Right handed    Social Determinants of  Health   Financial Resource Strain: Not on file  Food Insecurity: Not on file  Transportation Needs: Not on file  Physical Activity: Not on file  Stress: Not on file  Social Connections: Not on file  Intimate Partner Violence: Not on file    Past Surgical History:  Procedure Laterality Date   APPENDECTOMY     BREAST REDUCTION SURGERY Bilateral 06/12/2019   Procedure: BREAST REDUCTION WITH LIPOSUCTION;  Surgeon: Peggye Form, DO;  Location: [facility];  Service: Plastics;  Laterality: Bilateral;  4 hours, please   CESAREAN SECTION  1994   DILATION AND CURETTAGE OF UTERUS     multiple due to miscarriages   KNEE ARTHROSCOPY Left 2013   LAPAROSCOPIC APPENDECTOMY N/A 12/08/2018   Procedure: APPENDECTOMY LAPAROSCOPIC;  Surgeon: Manus Rudd, MD;  Location: [facility];  Service: General;  Laterality: N/A;   REDUCTION MAMMAPLASTY Bilateral 2021   TENNIS ELBOW RELEASE/NIRSCHEL PROCEDURE  2009   WISDOM TOOTH EXTRACTION      Family History  Problem Relation Age of Onset   Depression Mother    Lymphoma Mother 7   Lung cancer Mother    Hypertension Father    Heart attack Father 33   Hypertension  Brother    High blood pressure Brother    High Cholesterol Brother    Healthy Daughter    Colon cancer Maternal Aunt [id number]   Esophageal cancer Neg Hx    Pancreatic cancer Neg Hx    Stomach cancer Neg Hx    Colon polyps Neg Hx    Rectal cancer Neg Hx     Allergies  Allergen Reactions   Penicillins Hives and Other (See Comments)    Yeast infections Yeast infections   Pt wants listed as an allergy    Zonisamide Hives    Current Outpatient Medications on File Prior to Visit  Medication Sig Dispense Refill   albuterol (PROVENTIL) (2.5 MG/3ML) 0.083% nebulizer solution Take 3 mLs (2.5 mg total) by nebulization every 4 (four) hours as needed for wheezing or shortness of breath (coughing fits). 75 mL 2   albuterol (VENTOLIN HFA) 108 (90 Base) MCG/ACT inhaler Inhale 2  puffs into the lungs every 4 (four) hours as needed for wheezing or shortness of breath (coughing fits). 18 g 1   budesonide (PULMICORT) 0.5 MG/2ML nebulizer solution TAKE 2 MLS (0.5 MG TOTAL) BY NEBULIZATION IN THE MORNING AND AT BEDTIME. TAKE DURING UPPER RESPIRATORY INFECTIONS FOR 1-2 WEEKS AT A TIME. 360 mL 0   buPROPion (WELLBUTRIN XL) 150 MG 24 hr tablet Take 1 tablet (150 mg total) by mouth daily. 90 tablet 0   cyclobenzaprine (FLEXERIL) 10 MG tablet Take 10 mg by mouth 3 (three) times daily as needed (migraines).     DULoxetine (CYMBALTA) 60 MG capsule Take 1 capsule (60 mg total) by mouth daily. 90 capsule 3   Estradiol (YUVAFEM) 10 MCG TABS vaginal tablet Place 1 tablet (10 mcg total) vaginally daily. 8 tablet 0   fluticasone furoate-vilanterol (BREO ELLIPTA) 200-25 MCG/ACT AEPB Inhale 1 puff into the lungs daily. Rinse mouth after each use. 60 each 5   Fremanezumab-vfrm (AJOVY) 225 MG/1.5ML SOAJ Inject 225 mg into the skin every 28 (twenty-eight) days. 1.68 mL 5   gabapentin (NEURONTIN) 300 MG capsule Take 300-900 mg by mouth See admin instructions. Take 1 tablet every AM, three tablets at bedtime     Multiple Vitamin (MULTIVITAMIN WITH MINERALS) TABS tablet Take 1 tablet by mouth daily.     ondansetron (ZOFRAN) 4 MG tablet Take 1 tablet (4 mg total) by mouth every 8 (eight) hours as needed for nausea or vomiting. 20 tablet 2   Respiratory Therapy Supplies (FLUTTER) DEVI Use 3 times daily as directed 1 each 0   rizatriptan (MAXALT) 10 MG tablet Take by mouth.     rosuvastatin (CRESTOR) 5 MG tablet Take 1 tablet (5 mg total) by mouth at bedtime. 90 tablet 0   Semaglutide-Weight Management (WEGOVY) 1.7 MG/0.75ML SOAJ Inject 1.7 mg into the skin once a week. 3 mL 11   Spacer/Aero-Holding Chambers (AEROCHAMBER MV) inhaler Use as instructed 1 each 1   Tezepelumab-ekko (TEZSPIRE) 210 MG/1.91ML SOAJ Inject 210 mg into the skin every 28 (twenty-eight) days. 1.91 mL 11   traZODone (DESYREL) 100 MG  tablet TAKE 1 TO 2 TABLETS BY MOUTH AT  BEDTIME AS NEEDED. FOR SLEEP 90 tablet 7   tretinoin (RETIN-A) 0.05 % cream Apply topically.     valACYclovir (VALTREX) 1000 MG tablet Take 1 tablet (1,000 mg total) by mouth 2 (two) times daily. 14 tablet 5   Current Facility-Administered Medications on File Prior to Visit  Medication Dose Route Frequency Provider Last Rate Last Admin   tezepelumab-ekko (TEZSPIRE) 210 MG/1.91ML syringe  210 mg  210 mg Subcutaneous Q28 days Ellamae Sia, DO   210 mg at 08/19/21 1138    BP 110/62   Pulse 72   Temp 98.4 F (36.9 C) (Oral)   Ht 5' 5.75" (1.67 m)   Wt 209 lb (94.8 kg)   LMP 12/20/2018   SpO2 98%   BMI 33.99 kg/m       Objective:   Physical Exam Vitals and nursing note reviewed.  Constitutional:      Appearance: Normal appearance.  HENT:     Right Ear: Ear canal and external ear normal. A middle ear effusion is present. There is no impacted cerumen. Tympanic membrane is not erythematous.     Left Ear: Ear canal and external ear normal. A middle ear effusion is present. There is no impacted cerumen. Tympanic membrane is not erythematous.     Nose: Nose normal. No congestion or rhinorrhea.  Eyes:     Extraocular Movements:     Right eye: Nystagmus (horizontal) present.     Left eye: Nystagmus (horizontal) present.  Cardiovascular:     Rate and Rhythm: Normal rate and regular rhythm.     Pulses: Normal pulses.     Heart sounds: Normal heart sounds.  Pulmonary:     Effort: Pulmonary effort is normal.     Breath sounds: Normal breath sounds.  Neurological:     Mental Status: She is alert.     Cranial Nerves: Cranial nerves 2-12 are intact.     Sensory: Sensation is intact.     Motor: Motor function is intact.     Coordination: Coordination is intact.     Gait: Gait is intact.     Comments: She did become dizzy in the office with change in positions.   Psychiatric:        Mood and Affect: Mood normal.        Behavior: Behavior normal.         Thought Content: Thought content normal.        Judgment: Judgment normal.       Assessment & Plan:  1. Vertigo  - meclizine (ANTIVERT) 25 MG tablet; Take 1 tablet (25 mg total) by mouth 3 (three) times daily as needed for dizziness.  Dispense: 30 tablet; Refill: 0 - Home epley maneuver instructions given  - Follow up in one year or sooner if needed  2. Viral illness - POC COVID-19 BinaxNow- negative - POCT Influenza A/B- negative - POCT rapid strep A- negative   Shirline Frees, NP

## 2022-08-03 NOTE — Patient Instructions (Signed)
It was great meeting you today   You appear to have vertigo that has been caused by a viral illness. I have sent in some Meclizine for you to take to help with the symptoms. Continue to take Allegra, stay hydrated and rest over the weekend   Let us know if this does not resolve in the next few days

## 2022-08-09 ENCOUNTER — Ambulatory Visit: Payer: 59 | Admitting: Family Medicine

## 2022-08-14 NOTE — Progress Notes (Unsigned)
NEUROLOGY FOLLOW UP OFFICE NOTE  Alyssa Clay [id number]  Assessment/Plan:   Migraine with aura, without status migrainosus, intractable   Toradol  injection today Migraine prevention:  Ajovy and acupuncture.   Migraine rescue:  rizatriptan and zofran - will have her try samples of Elxyb to take with rizatriptan Limit use of pain relievers to no more than 2 days out of week to prevent risk of rebound or medication-overuse headache. Keep headache diary Follow up 6 months.         Subjective:  Alyssa Clay is a 57 year old female with asthma, depression who follows up for migraines.   UPDATE: Doing well on Ajovy.  Started acupuncture which has been helpful as well.   Alyssa Clay was not helpful.  Prefers Maxalt.    Intensity:  moderate to severe Duration:  6-8 hours with Maxalt (does repeat dose) Frequency:  2-3 days a month. Has migraine now for 2 days.  Did not take Maxalt at earliest onset.    Mostly migraines have been right periorbital now.  Frequency of abortive medication: as needed Current NSAIDS/analgesics:  none Current triptans:  none Current ergotamine: none Current anti-emetic:  Compazine  Current muscle relaxants:  Flexeril  (neck and head soreness) Current Antihypertensive medications:  none Current Antidepressant medications:  Cymbalta  daily, Wellbutrin, trazodone Current Anticonvulsant medications:  gabapentin 300 in AM and  in PM Current anti-CGRP:  Ajovy Current Vitamins/Herbal/Supplements:  none Current Antihistamines/Decongestants:  none Other therapy:  acupuncture birth control:  none   Caffeine:  3 cups of coffee daily Diet:  Hydrates.  No soda Exercise:  goes to gym (2 cardio days, 2 weight lifting days, speed weight day) Depression:  yes; Anxiety:  yes - difficult year.  Several family members passed away this year. Sleep hygiene:  good with trazodone   HISTORY:  Onset:  45 or 57 years old with onset of menses  menstrual until 76 and then more often.  Pregnant at 27 -  Location:  frontal, temporal, top of head bilaterally Quality:  squeezing, stabbing Intensity:  moderate to severe Aura:  sometimes zigzag pattern in visioin Prodrome:  absent Associated symptoms:  Photophobia, phonophobia, osmophobia.  She denies associated nausea, vomiting, autonomic symptoms, unilateral numbness or weakness. Duration:  1 to 3 days Frequency:  5 to 12 a month Triggers:  bright lights, stress Relieving factors:  rest, dark room Activity:  aggravates   MRI of brain without contrast on 10/14/2012 was normal   Past NSAIDS/analgesics:  ibuprofen, Tylenol, naproxen, Fioricet, Codeine, darvocet, Norco, Tylenol #3, tramadol, Midrin, toradol Past abortive triptans:  Amerge, Frova, Relpax, sumatriptan tab/[place], Maxalt, Zomig, Treximet Past abortive ergotamine: Trudhesa Past muscle relaxants:  tizanidine, skelaxin Past anti-emetic:  promethazine Past antihypertensive medications:  propranolol, atenolol, verapamil, metoprolol Past antidepressant medications:  amitriptyline, sertraline, Lexapro, Celexa, Prozac Past anticonvulsant medications:  topiramate, Depakote, zonisamide Past anti-CGRP:  Qulipta, Bernita Raisin, Nurtec, Aimovig, Zavzpret Past vitamins/Herbal/Supplements:  Migrelief BID Past antihistamines/decongestants:  none Other past therapies:  Botox, sphenopalatine ganglion block, trigger point injections, dry needling     Family history of headache:  mom, siblings, aunts, cousins  PAST MEDICAL HISTORY: Past Medical History:  Diagnosis Date   Allergy    Anemia    Asthma    Constipation    issues x the past month -? 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 75 mL 2   albuterol (VENTOLIN HFA) 108 (90 Base) MCG/ACT inhaler Inhale 2 puffs into the lungs every 4 (four) hours as needed for wheezing or shortness of breath (coughing fits). 18 g 1   budesonide (PULMICORT) 0.5 MG/2ML nebulizer solution TAKE 2 MLS (0.5 MG TOTAL) BY NEBULIZATION IN THE MORNING AND AT BEDTIME. TAKE DURING UPPER RESPIRATORY INFECTIONS FOR 1-2 WEEKS AT A TIME. 360 mL 0   buPROPion (WELLBUTRIN XL) 150 MG 24 hr tablet Take 1 tablet (150 mg total) by mouth daily. 90 tablet 0   cyclobenzaprine (FLEXERIL) 10 MG tablet Take 10 mg by mouth 3 (three) times daily as needed (migraines).     DULoxetine (CYMBALTA) 60 MG capsule Take 1 capsule (60 mg total) by mouth daily. 90 capsule 3   Estradiol (YUVAFEM) 10 MCG TABS vaginal tablet Place 1 tablet (10 mcg total) vaginally daily. 8 tablet 0   fluticasone furoate-vilanterol (BREO ELLIPTA) 200-25 MCG/ACT AEPB Inhale 1 puff into the lungs daily. Rinse mouth after each use. 60 each 5   Fremanezumab-vfrm (AJOVY) 225 MG/1.5ML SOAJ Inject 225 mg into the skin every 28 (twenty-eight) days. 1.68 mL 5   gabapentin (NEURONTIN) 300 MG capsule Take 300-900 mg by mouth See admin instructions. Take 1 tablet every AM, three tablets at bedtime     meclizine (ANTIVERT) 25 MG tablet Take 1 tablet (25 mg total) by mouth 3 (three) times daily as needed for dizziness. 30 tablet 0   Multiple Vitamin (MULTIVITAMIN WITH MINERALS) TABS tablet Take 1 tablet by mouth daily.     ondansetron (ZOFRAN) 4 MG tablet Take 1 tablet (4 mg total) by mouth every 8 (eight) hours as needed for nausea or vomiting. 20 tablet 2   Respiratory Therapy Supplies (FLUTTER) DEVI Use 3 times daily as  directed 1 each 0   rizatriptan (MAXALT) 10 MG tablet Take by mouth.     rosuvastatin (CRESTOR) 5 MG tablet Take 1 tablet (5 mg total) by mouth at bedtime. 90 tablet 0   Semaglutide-Weight Management (WEGOVY) 1.7 MG/0.75ML SOAJ Inject 1.7 mg into the skin once a week. 3 mL 11   Spacer/Aero-Holding Chambers (AEROCHAMBER MV) inhaler Use as instructed 1 each 1   Tezepelumab-ekko (TEZSPIRE) 210 MG/1. SOAJ Inject 210 mg into the skin every 28 (twenty-eight) days. 1.91 mL 11   traZODone (DESYREL) 100 MG tablet TAKE 1 TO 2 TABLETS BY MOUTH AT  BEDTIME AS NEEDED. FOR SLEEP 90 tablet 7   tretinoin (RETIN-A) 0.05 % cream Apply topically.     valACYclovir (VALTREX) 1000 MG tablet Take 1 tablet (1,000 mg total) by mouth 2 (two) times daily. 14 tablet 5   Current Facility-Administered Medications on File Prior to Visit  Medication Dose Route Frequency Provider Last Rate Last Admin   tezepelumab-ekko (TEZSPIRE) 210 MG/1. syringe 210 mg  210 mg Subcutaneous Q28 days Ellamae Sia, DO   210 mg at 08/19/21 1138    ALLERGIES: Allergies  Allergen Reactions   Penicillins Hives and Other (  See Comments)    Yeast infections Yeast infections   Pt wants listed as an allergy    Zonisamide Hives    FAMILY HISTORY: Family History  Problem Relation Age of Onset   Depression Mother    Lymphoma Mother 77   Lung cancer Mother    Hypertension Father    Heart attack Father 87   Hypertension Brother    High blood pressure Brother    High Cholesterol Brother    Healthy Daughter    Colon cancer Maternal Aunt 98   Esophageal cancer Neg Hx    Pancreatic cancer Neg Hx    Stomach cancer Neg Hx    Colon polyps Neg Hx    Rectal cancer Neg Hx       Objective:  Blood pressure 137/74, pulse 94, height  (1.651 m), weight 211 lb (95.7 kg), last menstrual period 12/20/2018, SpO2 98 %. General: No acute distress.  Patient appears well-groomed.   Head:  Normocephalic/atraumatic Eyes:  Fundi examined but  not visualized Neck: supple, no paraspinal tenderness, full range of motion Heart:  Regular rate and rhythm Lungs:  Clear to auscultation bilaterally Back: No paraspinal tenderness Neurological Exam: alert and oriented to person, place, and time.  Speech fluent and not dysarthric, language intact.  CN II-XII intact. Bulk and tone normal, muscle strength 5/5 throughout.  Sensation to light touch intact.  Deep tendon reflexes 2+ throughout, toes downgoing.  Finger to nose testing intact.  Gait normal, Romberg negative.   Shon Millet, DO  CC: Nira Conn, MD

## 2022-08-16 ENCOUNTER — Ambulatory Visit (INDEPENDENT_AMBULATORY_CARE_PROVIDER_SITE_OTHER): Payer: 59 | Admitting: Neurology

## 2022-08-16 ENCOUNTER — Encounter: Payer: Self-pay | Admitting: Neurology

## 2022-08-16 VITALS — BP 137/74 | HR 94 | Ht 65.0 in | Wt 211.0 lb

## 2022-08-16 DIAGNOSIS — G43009 Migraine without aura, not intractable, without status migrainosus: Secondary | ICD-10-CM

## 2022-08-16 DIAGNOSIS — R110 Nausea: Secondary | ICD-10-CM

## 2022-08-16 MED ORDER — RIZATRIPTAN BENZOATE 10 MG PO TABS: 10.0000 mg | ORAL_TABLET | ORAL | 5 refills | Status: DC | PRN

## 2022-08-16 MED ORDER — KETOROLAC TROMETHAMINE 60 MG/2ML IM SOLN
60.0000 mg | Freq: Once | INTRAMUSCULAR | Status: AC
Start: 2022-08-16 — End: 2022-08-16
  Administered 2022-08-16: 60 mg via INTRAMUSCULAR

## 2022-08-16 MED ORDER — AJOVY 225 MG/1.5ML ~~LOC~~ SOAJ: 225.0000 mg | SUBCUTANEOUS | 11 refills | Status: DC

## 2022-08-16 MED ORDER — ONDANSETRON HCL 4 MG PO TABS
4.0000 mg | ORAL_TABLET | Freq: Three times a day (TID) | ORAL | 5 refills | Status: DC | PRN
Start: 2022-08-16 — End: 2022-12-18

## 2022-08-16 NOTE — Patient Instructions (Addendum)
Continue Ajovy Take Elxyb with rizatriptan and ondansetron.  Only take 1 in 24 hours but may still repeat rizatriptan after 2 hours.  Let me know if effective or not Limit use of pain relievers to no more than 2 days out of week to prevent risk of rebound or medication-overuse headache. Keep headache diary

## 2022-08-16 NOTE — Addendum Note (Signed)
Addended by: Leida Lauth on: 08/16/2022 09:03 AM   Modules accepted: Orders

## 2022-08-31 ENCOUNTER — Other Ambulatory Visit: Payer: Self-pay | Admitting: Family Medicine

## 2022-08-31 DIAGNOSIS — F3341 Major depressive disorder, recurrent, in partial remission: Secondary | ICD-10-CM

## 2022-08-31 DIAGNOSIS — E785 Hyperlipidemia, unspecified: Secondary | ICD-10-CM

## 2022-09-01 NOTE — Telephone Encounter (Signed)
I don't see the crestor, looks like someone discontinued this, also I gave her 73 with 3 refills on the cymbalta already, she will need to contact her pharmacy for the refill

## 2022-09-04 ENCOUNTER — Other Ambulatory Visit: Payer: Self-pay | Admitting: *Deleted

## 2022-09-04 DIAGNOSIS — F3341 Major depressive disorder, recurrent, in partial remission: Secondary | ICD-10-CM

## 2022-09-04 MED ORDER — DULOXETINE HCL 60 MG PO CPEP
60.0000 mg | ORAL_CAPSULE | Freq: Every day | ORAL | 3 refills | Status: DC
Start: 2022-09-04 — End: 2023-07-18

## 2022-09-04 NOTE — Telephone Encounter (Signed)
Rx done. 

## 2022-09-05 ENCOUNTER — Ambulatory Visit (INDEPENDENT_AMBULATORY_CARE_PROVIDER_SITE_OTHER): Payer: 59 | Admitting: Family Medicine

## 2022-09-05 ENCOUNTER — Ambulatory Visit: Payer: 59 | Admitting: Family Medicine

## 2022-09-05 ENCOUNTER — Encounter: Payer: Self-pay | Admitting: Family Medicine

## 2022-09-05 VITALS — BP 110/64 | HR 86 | Temp 98.4°F | Wt 211.0 lb

## 2022-09-05 DIAGNOSIS — R42 Dizziness and giddiness: Secondary | ICD-10-CM | POA: Diagnosis not present

## 2022-09-05 NOTE — Progress Notes (Signed)
   Subjective:    Patient ID: Alyssa Clay, female    DOB: 05/25/1965, 57 y.o.   MRN: [id number]  HPI Here for vertigo. She had never had this before one month ago when she began to experience sensations that the room was spinning when she moved her head quickly from side to side. No hx of head trauma. She has a long hx of migraines, and she sees Dr. Everlena Cooper for these. She denies any other neurologic deficits. She tried Meclizine with no relief. She takes Zofran as needed for the nausea. She cannot tolerate antihistamines because they dry her out too much. She was referred to PT, and she has had 2 sessions of vestibular rehab, but these have not helped so far. She is not driving because of this.    Review of Systems  Constitutional: Negative.   HENT: Negative.    Respiratory: Negative.    Cardiovascular: Negative.   Neurological:  Positive for dizziness and headaches. Negative for tremors, seizures, syncope, facial asymmetry, speech difficulty, weakness, light-headedness and numbness.       Objective:   Physical Exam Constitutional:      Comments: She is sitting in the exam room with the lights off  Eyes:     Extraocular Movements: Extraocular movements intact.     Pupils: Pupils are equal, round, and reactive to light.  Cardiovascular:     Rate and Rhythm: Normal rate and regular rhythm.     Pulses: Normal pulses.     Heart sounds: Normal heart sounds.  Pulmonary:     Effort: Pulmonary effort is normal.     Breath sounds: Normal breath sounds.  Neurological:     Mental Status: She is alert and oriented to person, place, and time.     Cranial Nerves: No cranial nerve deficit.     Coordination: Coordination normal.     Gait: Gait normal.           Assessment & Plan:  Vertigo. I suggested she try Flonase nasal sprays daily. We will refer her to see Dr. Everlena Cooper specifically for this problem in addition to the migraines.  Gershon Crane, MD

## 2022-09-08 ENCOUNTER — Ambulatory Visit (INDEPENDENT_AMBULATORY_CARE_PROVIDER_SITE_OTHER): Payer: 59 | Admitting: Neurology

## 2022-09-08 ENCOUNTER — Encounter: Payer: Self-pay | Admitting: Neurology

## 2022-09-08 VITALS — BP 119/74 | HR 64 | Ht 65.5 in | Wt 211.0 lb

## 2022-09-08 DIAGNOSIS — H8121 Vestibular neuronitis, right ear: Secondary | ICD-10-CM

## 2022-09-08 MED ORDER — DIAZEPAM 5 MG PO TABS: 5.0000 mg | ORAL_TABLET | Freq: Two times a day (BID) | ORAL | 0 refills | Status: DC | PRN

## 2022-09-08 NOTE — Patient Instructions (Addendum)
Continue physical therapy May use diazepam or dramamine if severe but USE SPARINGLY.  No driving if taken. Follow up as scheduled.     Labyrinthitis  Labyrinthitis is an inner ear infection. The inner ear is a system of tubes and canals (labyrinth) that are filled with fluid. The inner ear also contains nerve cells that send hearing and balance signals to the brain. When tiny germs (microorganisms) get inside the labyrinth, they harm the cells that send messages to the brain. This can cause changes in hearing and balance. Labyrinthitis usually develops suddenly and goes away with treatment in a few weeks (acute labyrinthitis). If the infection damages parts of the labyrinth, some symptoms may last for a long time (chronic labyrinthitis). What are the causes? Labyrinthitis can be caused by viruses, such as one that causes: Infectious mononucleosis, also called mono. Measles or mumps. The flu (influenza). Herpes. Labyrinthitis can also be caused by bacteria that spread from an infection in the brain or the middle ear (suppurative labyrinthitis). In some cases, the bacteria may produce a poison (toxin) that gets inside the labyrinth (serous labyrinthitis). What increases the risk? You may be at greater risk for labyrinthitis if you: Recently had a mouth, nose, or throat infection (upper respiratory infection) or an ear infection. Drink a lot of alcohol. Smoke. Use certain drugs. Are feeling tired (fatigued). Are experiencing a lot of stress. Have allergies. What are the signs or symptoms? Symptoms of labyrinthitis usually start suddenly. The symptoms may range from mild to severe, and may include: Dizziness. Hearing loss. A feeling that you or your surroundings are moving when they are not (vertigo). Ringing in your ear (tinnitus). Nausea and vomiting. Trouble focusing your eyes. Symptoms of chronic labyrinthitis may include: Fatigue. Confusion. Hearing loss. Tinnitus. Poor  balance. Vertigo after sudden head movements. How is this diagnosed? This condition may be diagnosed based on: Your symptoms and medical history. Your health care provider may ask about any dizziness or hearing loss you have and any recent upper respiratory infections. A physical exam that involves: Checking your ears for infection. Testing your balance. Checking your eye movement. Hearing tests. Imaging tests, such as a CT scan or an MRI. Tests of your eye movements (electronystagmogram, or ENG). How is this treated? Treatment depends on the cause. If your condition is caused by bacteria, you may need antibiotic medicine. If it is caused by a virus, it may get better on its own. Regardless of the cause, you may be treated with: Medicines to: Stop dizziness. Relieve nausea. Reduce inflammation. Speed your recovery. IV fluids. These may be given at a hospital. You may need IV fluids if you have severe nausea and vomiting. Physical therapy. A therapist can teach you exercises to help you adjust to feeling dizzy (vestibular rehabilitation exercises). You may need this if you have dizziness that does not go away. Follow these instructions at home: Medicines Take over-the-counter and prescription medicines only as told by your health care provider. If you were prescribed an antibiotic medicine, take it as told by your health care provider. Do not stop taking the antibiotic even if you start to feel better. Activity Rest as told by your health care provider. Limit your activity as directed. Ask your health care provider what activities are safe for you. Do not make sudden movements until any dizziness goes away. If physical therapy was prescribed, do exercises as directed. General instructions Avoid loud noises and bright lights. Do not drive until your health care provider says  that this is safe for you. Drink enough fluid to keep your urine pale yellow. Keep all follow-up visits. This  is important. Contact a health care provider if you have: Symptoms that do not get better with medicine. Symptoms that last longer than 2 weeks. A fever. Get help right away if you have: Nausea or vomiting that is severe or does not go away. Severe dizziness. Sudden hearing loss. Summary Labyrinthitis is an infection of the inner ear. It can cause changes in hearing and balance or vertigo. Symptoms usually start suddenly and include dizziness, hearing loss, nausea, and vomiting. You may also have ringing in your ear (tinnitus), trouble focusing your eyes, and vertigo. If the condition lasts more than a few weeks, symptoms may include fatigue, confusion, hearing loss, poor balance, tinnitus, and vertigo. Treatment depends on the cause. If your labyrinthitis is caused by bacteria, you may need antibiotic medicine. If your labyrinthitis is caused by a virus, it may get better on its own. Follow your health care provider's instructions, including how to take medicines, what activities to avoid, and when to get medical help. This information is not intended to replace advice given to you by your health care provider. Make sure you discuss any questions you have with your health care provider. Document Revised: 05/19/2020 Document Reviewed: 05/19/2020 Elsevier Patient Education  2023 [employer].

## 2022-09-08 NOTE — Progress Notes (Signed)
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  Too late for prednisone and valcyclovir.    Continue PT If she has acute flare up, may use dramamine.  Will also prescribe small dose of diazepam as well.  Instructed to take only sparingly.  Of course discussed side effects of drowsiness and no driving if taken Follow up in October as scheduled.  Subjective:  Alyssa Clay is a 57 year old female with asthma whom I see for migraine with aura, presenting today for vertigo.  History supplemented by primary care notes and accompanying husband.    In early April, she developed a viral ilness, including low-grade fever, sore throat, and headache.  A couple of days, she developed dizziness, described as a spinning sensation triggered by change in position.  Labs for COVID, flu and strep A were negative.  She was prescribed meclizine and given instructions for home Epley maneuver which were ineffective.  She saw PT.  Exam revealed right vestibular hypofunction, including positive right Head Impulse Test.  She went to two sessions of vestibular rehab with PT which were not helpful.  Always experiences sensation of dizziness but dull when still, aggravated with movement.  Associated nausea.  Denies aural fullness, otalgia, tinnitus or hearing loss.     PAST MEDICAL HISTORY: Past Medical History:  Diagnosis Date   Allergy    Anemia    Asthma    Constipation    issues x the past month -? 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 75 mL 2   albuterol (VENTOLIN HFA) 108 (90 Base) MCG/ACT inhaler Inhale 2 puffs into the lungs every 4 (four) hours as needed for wheezing or shortness of breath (coughing fits). 18 g 1   budesonide (PULMICORT) 0.5 MG/2ML nebulizer solution TAKE 2 MLS (0.5 MG TOTAL) BY NEBULIZATION IN THE MORNING AND AT BEDTIME. TAKE DURING UPPER RESPIRATORY INFECTIONS FOR 1-2 WEEKS AT A TIME. 360 mL 0   buPROPion (WELLBUTRIN XL) 150 MG 24 hr tablet Take 1 tablet (150 mg total) by mouth daily. 90 tablet 0   cyclobenzaprine (FLEXERIL) 10 MG tablet Take 10 mg by mouth 3 (three) times daily as needed (migraines).     DULoxetine (CYMBALTA) 60 MG capsule Take 1 capsule (60 mg total) by mouth daily. 90 capsule 3   Estradiol (YUVAFEM) 10 MCG TABS vaginal tablet Place 1 tablet (10 mcg total) vaginally daily. 8 tablet 0   fluticasone furoate-vilanterol (BREO ELLIPTA) 200-25 MCG/ACT AEPB Inhale 1 puff into the lungs daily. Rinse mouth after each use. 60 each 5   Fremanezumab-vfrm (AJOVY) 225 MG/1.5ML SOAJ Inject 225 mg into the skin every 28 (twenty-eight) days. 1.68 mL 11   gabapentin (NEURONTIN) 300 MG capsule Take 300-900 mg by mouth See admin instructions. Take 1 tablet  every AM, three tablets at bedtime     meclizine (ANTIVERT) 25 MG tablet Take 1 tablet (25 mg total) by mouth 3 (three) times daily as needed for dizziness. (Patient not taking: Reported on 09/05/2022) 30 tablet 0   Multiple Vitamin (MULTIVITAMIN WITH MINERALS) TABS tablet Take 1 tablet by mouth daily.     ondansetron (ZOFRAN) 4 MG tablet Take 1 tablet (4 mg total) by mouth every 8 (eight) hours as needed for nausea or vomiting. (Patient not taking: Reported on 09/05/2022) 20 tablet 5   Respiratory Therapy Supplies (FLUTTER) DEVI Use 3 times daily as directed 1 each 0   rizatriptan  (MAXALT) 10 MG tablet Take 1 tablet (10 mg total) by mouth as needed for migraine. May repeat in 2 hours if needed.  Maximum 2 tablets in 24 hours. 10 tablet 5   Semaglutide-Weight Management (WEGOVY) 1.7 MG/0.75ML SOAJ Inject 1.7 mg into the skin once a week. 3 mL 11   Spacer/Aero-Holding Chambers (AEROCHAMBER MV) inhaler Use as instructed 1 each 1   Tezepelumab-ekko (TEZSPIRE) 210 MG/1. SOAJ Inject 210 mg into the skin every 28 (twenty-eight) days. 1.91 mL 11   traZODone (DESYREL) 100 MG tablet TAKE 1 TO 2 TABLETS BY MOUTH AT  BEDTIME AS NEEDED. FOR SLEEP 90 tablet 7   tretinoin (RETIN-A) 0.05 % cream Apply topically.     valACYclovir (VALTREX) 1000 MG tablet Take 1 tablet (1,000 mg total) by mouth 2 (two) times daily. 14 tablet 5   Current Facility-Administered Medications on File Prior to Visit  Medication Dose Route Frequency Provider Last Rate Last Admin   tezepelumab-ekko (TEZSPIRE) 210 MG/1. syringe 210 mg  210 mg Subcutaneous Q28 days Ellamae Sia, DO   210 mg at 08/19/21 1138    ALLERGIES: Allergies  Allergen Reactions   Penicillins Hives and Other (See Comments)    Yeast infections Yeast infections   Pt wants listed as an allergy    Zonisamide Hives    FAMILY HISTORY: Family History  Problem Relation Age of Onset   Depression Mother    Lymphoma Mother 58   Lung cancer Mother    Hypertension Father    Heart attack Father 62   Hypertension Brother    High blood pressure Brother    High Cholesterol Brother    Healthy Daughter    Colon cancer Maternal Aunt 27   Esophageal cancer Neg Hx    Pancreatic cancer Neg Hx    Stomach cancer Neg Hx    Colon polyps Neg Hx    Rectal cancer Neg Hx       Objective:  Blood pressure 119/74, pulse 64, height 5' 5.5" (1.664 m), weight 211 lb (95.7 kg), last menstrual period 12/20/2018, SpO2 95 %. General: No acute distress.  Patient appears well-groomed.   Head:  Normocephalic/atraumatic Eyes:  Fundi examined but not  visualized Neck: supple, no paraspinal tenderness, full range of motion Heart:  Regular rate and rhythm Neurological Exam: alert and oriented to person, place, and time.  Speech fluent and not dysarthric, language intact.  CN II-XII intact. Bulk and tone normal, muscle strength 5/5 throughout.  Sensation to light touch intact.  Deep tendon reflexes 2+ throughout, toes downgoing.  Finger to nose testing intact.  Gait normal, Romberg negative.  Currently HIT negative.   Shon Millet, DO  CC:  Nira Conn, MD  Gershon Crane, MD

## 2022-09-22 ENCOUNTER — Other Ambulatory Visit: Payer: Self-pay | Admitting: Family Medicine

## 2022-09-22 DIAGNOSIS — F3341 Major depressive disorder, recurrent, in partial remission: Secondary | ICD-10-CM

## 2022-09-29 ENCOUNTER — Telehealth: Payer: Self-pay | Admitting: Family Medicine

## 2022-09-29 NOTE — Telephone Encounter (Signed)
We already did her blood work in March so she won't need any at the next visit!

## 2022-09-29 NOTE — Telephone Encounter (Signed)
Patient informed of the message below.

## 2022-09-29 NOTE — Telephone Encounter (Signed)
Pt called in and stated she has an appt to see Dr. Casimiro Needle on 10/04/22. She said she is currently taking steroids for ear pain and wants to know if Dr. Casimiro Needle is planning on doing blood work at her appt on 6/12. If so she is worried the blood work may raise her sugar levels and interfere with her steroids. She would like a call back letting her know if she should keep her appt with Dr. Casimiro Needle on 6/12 or r/s until after she is done with her steroids.

## 2022-10-04 ENCOUNTER — Encounter: Payer: Self-pay | Admitting: Family Medicine

## 2022-10-04 ENCOUNTER — Ambulatory Visit (INDEPENDENT_AMBULATORY_CARE_PROVIDER_SITE_OTHER): Payer: 59 | Admitting: Family Medicine

## 2022-10-04 VITALS — BP 120/78 | HR 95 | Temp 98.9°F | Ht 65.5 in | Wt 211.3 lb

## 2022-10-04 DIAGNOSIS — R42 Dizziness and giddiness: Secondary | ICD-10-CM

## 2022-10-04 DIAGNOSIS — Z6838 Body mass index (BMI) 38.0-38.9, adult: Secondary | ICD-10-CM | POA: Diagnosis not present

## 2022-10-04 MED ORDER — WEGOVY 2.4 MG/0.75ML ~~LOC~~ SOAJ
2.4000 mg | SUBCUTANEOUS | 5 refills | Status: DC
Start: 2022-10-04 — End: 2023-04-05

## 2022-10-04 NOTE — Progress Notes (Signed)
Established Patient Office Visit  Subjective   Patient ID: Alyssa Clay, female    DOB: Jan 05, 1966  Age: 57 y.o. MRN: [id number]  Chief Complaint  Patient presents with   Medical Management of Chronic Issues    Pt is here for follow up today. States that she was able to get the 2.4 mg of wegovy, has been on it for about 1 month. States that her weight has remained relatively flat, no loss in the last 3 months, see below.   States that for the past several month she has been struggling with chronic vertigo, states that she has trouble driving due to the vertigo. She is also having ear pain and feels like she is losing hearing in the right ear. Patient has been treated with steroids, meclizine, decongestants, flonase without any relief. States she already has an appointment with an ENT coming up soon.   Patient has been under some emotional stress due to family issues. States that this is the anniversary of her mother's passing.    Current Outpatient Medications  Medication Instructions   Ajovy 225 mg, Subcutaneous, Every 28 days   albuterol (PROVENTIL) 2.5 mg, Nebulization, Every 4 hours PRN   albuterol (VENTOLIN HFA) 108 (90 Base) MCG/ACT inhaler 2 puffs, Inhalation, Every 4 hours PRN   budesonide (PULMICORT) 0.5 mg, Nebulization, 2 times daily, Take during upper respiratory infections for 1-2 weeks at a time.   buPROPion (WELLBUTRIN XL) 150 mg, Oral, Daily   cyclobenzaprine (FLEXERIL) 10 mg, Oral, 3 times daily PRN   diazepam (VALIUM) 5 mg, Oral, 2 times daily PRN   DULoxetine (CYMBALTA) 60 mg, Oral, Daily   Estradiol (YUVAFEM) 10 mcg, Vaginal, Daily   fluticasone furoate-vilanterol (BREO ELLIPTA) 200-25 MCG/ACT AEPB 1 puff, Inhalation, Daily, Rinse mouth after each use.   gabapentin (NEURONTIN) 300-900 mg, Oral, See admin instructions, Take 1 tablet every AM, three tablets at bedtime   meclizine (ANTIVERT) 25 mg, Oral, 3 times daily PRN   Multiple Vitamin (MULTIVITAMIN WITH  MINERALS) TABS tablet 1 tablet, Oral, Daily   ondansetron (ZOFRAN) 4 mg, Oral, Every 8 hours PRN   Respiratory Therapy Supplies (FLUTTER) DEVI Use 3 times daily as directed   rizatriptan (MAXALT) 10 mg, Oral, As needed, May repeat in 2 hours if needed.  Maximum 2 tablets in 24 hours.   Spacer/Aero-Holding Chambers (AEROCHAMBER MV) inhaler Use as instructed   Tezspire 210 mg, Subcutaneous, Every 28 days   traZODone (DESYREL) 100-200 mg, Oral, At bedtime PRN, for sleep   tretinoin (RETIN-A) 0.05 % cream Topical   valACYclovir (VALTREX) 1,000 mg, Oral, 2 times daily   Wegovy 2.4 mg, Subcutaneous, Weekly    Patient Active Problem List   Diagnosis Date Noted   Vertigo 09/05/2022   Acute viral pharyngitis 06/06/2022   Acute left otitis media 02/21/2022   Recurrent infections 11/14/2021   Vaginal yeast infection 11/14/2021   Rash and other nonspecific skin eruption 11/14/2021   Class 2 obesity with body mass index (BMI) of 38.0 to 38.9 in adult 10/19/2021   History of COVID-19 09/12/2021   Chronic migraine without aura without status migrainosus, not intractable 05/26/2021   Lumbar radiculopathy, right 10/08/2020   SI (sacroiliac) joint dysfunction 09/16/2020   Somatic dysfunction of spine, sacral 09/16/2020   OSA (obstructive sleep apnea) 12/18/2019   FUO (fever of unknown origin) 10/21/2019   S/P bilateral breast reduction 06/19/2019   Back pain 04/15/2019   Neck pain 04/15/2019   History of frequent upper respiratory  infection 01/16/2019   Atelectasis 01/16/2019   Severe persistent asthma without complication 01/16/2019   Shortness of breath 01/01/2019   Primary osteoarthritis of both knees 07/10/2018   Hypertension 03/27/2018   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     Objective:     BP 120/78 (BP Location: Left Arm, Patient Position: Sitting, Cuff Size: Large)   Pulse 95   Temp 98.9 F (37.2 C) (Oral)   Ht 5' 5.5" (1.664 m)   Wt 211 lb 4.8 oz (95.8 kg)   LMP 12/20/2018   SpO2 100%   BMI 34.63 kg/m    Physical Exam Vitals reviewed.  Constitutional:      Appearance: Normal appearance. She is well-groomed. She is obese.  HENT:     Right Ear: Tympanic membrane and ear canal normal.     Left Ear: Tympanic membrane and ear canal normal.  Eyes:     Conjunctiva/sclera: Conjunctivae normal.  Cardiovascular:     Rate and Rhythm: Normal rate and regular rhythm.     Pulses: Normal pulses.     Heart sounds: S1 normal and S2 normal.  Pulmonary:     Effort: Pulmonary effort is normal.     Breath sounds: Normal breath sounds and air entry.  Neurological:     Mental Status: She is alert and oriented to person, place, and time. Mental status is at baseline.     Gait: Gait is intact.  Psychiatric:        Mood and Affect: Mood and affect normal.        Speech: Speech normal.        Behavior: Behavior normal.        Judgment: Judgment normal.      No results found for any visits on 10/04/22.    The 10-year ASCVD risk score (Arnett DK, et al., 2019) is: 1.1%    Assessment & Plan:  Class 2 severe obesity with serious comorbidity and body mass index (BMI) of 38.0 to 38.9 in adult, unspecified obesity type [facility]) Assessment & Plan: Was able to get wegovy 2.4 mg, will continue this weekly indefinitely, her weight is stable from previous. Will see back in 6 months for follow up and weight check.  Orders: -     Wegovy; Inject 2.4 mg into the skin once a week.  Dispense: 3 mL; Refill: 5  Vertigo Assessment & Plan: Seeing ENT for this later, she continues to go to physical therapy for treatment, using meclizine sparingly, has  ondansetron at home already in case of nausea.       Return in about 6 months (around 04/05/2023) for weight loss.    Karie Georges, MD

## 2022-10-04 NOTE — Assessment & Plan Note (Signed)
Was able to get wegovy 2.4 mg, will continue this weekly indefinitely, her weight is stable from previous. Will see back in 6 months for follow up and weight check.

## 2022-10-04 NOTE — Assessment & Plan Note (Signed)
Seeing ENT for this later, she continues to go to physical therapy for treatment, using meclizine sparingly, has ondansetron at home already in case of nausea.

## 2022-10-09 ENCOUNTER — Telehealth: Payer: Self-pay | Admitting: Neurology

## 2022-10-09 ENCOUNTER — Ambulatory Visit (INDEPENDENT_AMBULATORY_CARE_PROVIDER_SITE_OTHER): Payer: 59

## 2022-10-09 DIAGNOSIS — G43009 Migraine without aura, not intractable, without status migrainosus: Secondary | ICD-10-CM | POA: Diagnosis not present

## 2022-10-09 MED ORDER — KETOROLAC TROMETHAMINE 60 MG/2ML IM SOLN
60.0000 mg | Freq: Once | INTRAMUSCULAR | Status: AC
Start: 2022-10-09 — End: 2022-10-09
  Administered 2022-10-09: 60 mg via INTRAMUSCULAR

## 2022-10-09 MED ORDER — METOCLOPRAMIDE HCL 5 MG/ML IJ SOLN
10.0000 mg | Freq: Once | INTRAMUSCULAR | Status: AC
Start: 2022-10-09 — End: 2022-10-09
  Administered 2022-10-09: 10 mg via INTRAMUSCULAR

## 2022-10-09 MED ORDER — DIPHENHYDRAMINE HCL 50 MG/ML IJ SOLN
50.0000 mg | Freq: Once | INTRAMUSCULAR | Status: AC
Start: 2022-10-09 — End: 2022-10-09
  Administered 2022-10-09: 25 mg via INTRAMUSCULAR

## 2022-10-09 NOTE — Telephone Encounter (Signed)
I got her sch  

## 2022-10-09 NOTE — Telephone Encounter (Signed)
Patient advised per [facility] Patient may come in for a headache cocktail today if the migraine doesn't subside tomorrow please contact us and we can send in a Prednisone taper.   Per Patient for the last couple of months since seeing Dr.Jaffe last her headache days has increased to more than half the month( 15 days) last from Four days to a week.

## 2022-10-09 NOTE — Telephone Encounter (Addendum)
Per Dr.Jaffe, would like to switch from Ajovy to Vyepti 300mg  every 3 months.  Failed Aimovig, Ajovy, Qulipta, Nurtec, topiramate, zonisamide, Depakote, amitriptyline, duloxetine, propranolol, atenolol, verapamil, metoprolol

## 2022-10-09 NOTE — Telephone Encounter (Signed)
How frequent or the headaches (on average, how many days a week/month are they occurring)?  For the Last week . How long do the headaches last?  One Week Verify what preventative medication and dose you are taking (e.g. topiramate, propranolol, amitriptyline, Emgality, etc)  Ajovy And Acuputure  Verify which rescue medication you are taking (triptan, Advil, Excedrin, Aleve, Ubrelvy, etc)  Rizatriptan How often are you taking pain relievers/analgesics/rescue mediction?  Motrin       Would like to inquire about the Vyepti.

## 2022-10-09 NOTE — Telephone Encounter (Signed)
Alyssa Clay has had a migraines since last Monday 10/02/22. She would like to know what can be done to help her. She is interested in the IV infusion therapy

## 2022-10-17 ENCOUNTER — Telehealth: Payer: Self-pay | Admitting: Neurology

## 2022-10-17 NOTE — Telephone Encounter (Signed)
Pt is calling in wanting to check the status of getting the infusion started and would like to have a call back.

## 2022-10-17 NOTE — Telephone Encounter (Signed)
Patient advised once approval for Vypeti is received the Infusion center will call to schedule.

## 2022-10-24 ENCOUNTER — Telehealth: Payer: Self-pay | Admitting: Pharmacy Technician

## 2022-10-24 NOTE — Telephone Encounter (Signed)
Alyssa Clay,  Patient will be scheduled as soon as possible.  Auth Submission: APPROVED Site of care: Site of care: [facility] Payer: [employer] Medication & CPT/J Code(s) submitted: Vyepti (Eptinezumab) [id number] Route of submission (phone, fax, portal): PORTAL Phone # Fax # Auth type: Buy/Bill Units/visits requested: 100MG  Q3 MONTHS Reference number: [id number] Approval from: 10/12/22 to 10/12/23    VYEPTI CO-PAY CARD: APPROVED ID: [id number] PAYER ID: [id number] GR: [id number]

## 2022-11-01 ENCOUNTER — Ambulatory Visit (INDEPENDENT_AMBULATORY_CARE_PROVIDER_SITE_OTHER): Payer: 59 | Admitting: Family Medicine

## 2022-11-01 ENCOUNTER — Encounter: Payer: Self-pay | Admitting: Family Medicine

## 2022-11-01 VITALS — BP 116/78 | HR 91 | Temp 98.8°F | Wt 211.6 lb

## 2022-11-01 DIAGNOSIS — H9201 Otalgia, right ear: Secondary | ICD-10-CM

## 2022-11-01 DIAGNOSIS — R5381 Other malaise: Secondary | ICD-10-CM

## 2022-11-01 DIAGNOSIS — H6690 Otitis media, unspecified, unspecified ear: Secondary | ICD-10-CM

## 2022-11-01 LAB — POCT RAPID STREP A (OFFICE): Rapid Strep A Screen: NEGATIVE

## 2022-11-01 LAB — POC COVID19 BINAXNOW: SARS Coronavirus 2 Ag: NEGATIVE

## 2022-11-01 MED ORDER — CEFDINIR 300 MG PO CAPS: 300.0000 mg | ORAL_CAPSULE | Freq: Two times a day (BID) | ORAL | 0 refills | Status: AC

## 2022-11-01 NOTE — Progress Notes (Signed)
Established Patient Office Visit   Subjective  Patient ID: CHINELO BENN, female    DOB: 1965/11/28  Age: 57 y.o. MRN: [id number]  Chief Complaint  Patient presents with   Ernst Spell was yellow in top of mouth, today feels like sores. Noticed it yesterday, 100.5 temp yesterday, took a diflucan yesterday.     Patient is a 57 year old female followed by Dr. Casimiro Needle and seen for acute concern.  Patient endorses noticing a tingle and roof of mouth then seeing a yellowish color in mouth.  Patient thought it may be thrush as she took a leftover Diflucan yesterday.  Today mouth slightly sore and pt notes feeling tired, rundown.  Patient mentions she had friends from out of town to visit this weekend for her daughter's baby shower.  Patient also notes having right ear pain and vertigo x 3 months.  Has an appointment with ENT next week.    Past Medical History:  Diagnosis Date   Allergy    Anemia    Asthma    Constipation    issues x the past month -? 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   Enlarged ovary 09/15/2020   noted on ct scan, per pt   FUO (fever of unknown origin) 10/21/2019   GERD (gastroesophageal reflux disease)    Hypertension    PAST hx    Migraine    Past Surgical History:  Procedure Laterality Date   APPENDECTOMY     BREAST REDUCTION SURGERY Bilateral 06/12/2019   Procedure: BREAST REDUCTION WITH LIPOSUCTION;  Surgeon: Peggye Form, DO;  Location: [facility];  Service: Plastics;  Laterality: Bilateral;  4 hours, please   CESAREAN SECTION  1994   DILATION AND CURETTAGE OF UTERUS     multiple due to miscarriages   KNEE ARTHROSCOPY Left 2013   LAPAROSCOPIC APPENDECTOMY N/A 12/08/2018   Procedure: APPENDECTOMY LAPAROSCOPIC;  Surgeon: Manus Rudd, MD;  Location: [facility];  Service: General;  Laterality: N/A;   REDUCTION MAMMAPLASTY Bilateral 2021   TENNIS  ELBOW RELEASE/NIRSCHEL PROCEDURE  2009   WISDOM TOOTH EXTRACTION     Social History   Tobacco Use   Smoking status: Never   Smokeless tobacco: Never  Vaping Use   Vaping Use: Never used  Substance Use Topics   Alcohol use: Yes    Comment: occ 2-3 beers a month    Drug use: Never   Family History  Problem Relation Age of Onset   Depression Mother    Lymphoma Mother 43   Lung cancer Mother    Hypertension Father    Heart attack Father 24   Hypertension Brother    High blood pressure Brother    High Cholesterol Brother    Healthy Daughter    Colon cancer Maternal Aunt 38   Esophageal cancer Neg Hx    Pancreatic cancer Neg Hx    Stomach cancer Neg Hx    Colon polyps Neg Hx    Rectal cancer Neg Hx    Allergies  Allergen Reactions   Penicillins Hives and Other (See Comments)    Yeast infections Yeast infections   Pt wants listed as an allergy    Zonisamide Hives      ROS Negative unless stated above    Objective:     BP 116/78 (BP Location: Left Arm, Patient Position: Sitting, Cuff  Size: Normal)   Pulse 91   Temp 98.8 F (37.1 C) (Oral)   Wt 211 lb 9.6 oz (96 kg)   LMP 12/20/2018   SpO2 96%   BMI 34.68 kg/m    Physical Exam Constitutional:      General: She is not in acute distress.    Appearance: Normal appearance.  HENT:     Head: Normocephalic and atraumatic.     Right Ear: Tympanic membrane is erythematous and bulging.     Nose: Nose normal.     Mouth/Throat:     Mouth: Mucous membranes are moist.     Pharynx: Posterior oropharyngeal erythema present.  Cardiovascular:     Rate and Rhythm: Normal rate and regular rhythm.     Heart sounds: Normal heart sounds. No murmur heard.    No gallop.  Pulmonary:     Effort: Pulmonary effort is normal. No respiratory distress.     Breath sounds: Normal breath sounds. No wheezing, rhonchi or rales.  Skin:    General: Skin is warm and dry.  Neurological:     Mental Status: She is alert and oriented  to person, place, and time.     Results for orders placed or performed in visit on 11/01/22  POC Rapid Strep A  Result Value Ref Range   Rapid Strep A Screen Negative Negative  POC COVID-19  Result Value Ref Range   SARS Coronavirus 2 Ag Negative Negative      Assessment & Plan:  Acute otitis media, unspecified otitis media type -     Cefdinir; Take 1 capsule (300 mg total) by mouth 2 (two) times daily for 7 days.  Dispense: 14 capsule; Refill: 0  Right ear pain  Malaise -     POCT rapid strep A -     POC COVID-19 BinaxNow  Start abx for R AOM.  Supportive care with OTC cough/cold meds prn.  Keep appt with ENT.  Return if symptoms worsen or fail to improve.   Deeann Saint, MD

## 2022-11-01 NOTE — Patient Instructions (Signed)
Strep and COVID testing were negative.

## 2022-11-02 ENCOUNTER — Ambulatory Visit: Payer: 59

## 2022-11-02 ENCOUNTER — Encounter: Payer: Self-pay | Admitting: Family Medicine

## 2022-11-02 ENCOUNTER — Other Ambulatory Visit: Payer: Self-pay | Admitting: Family Medicine

## 2022-11-02 DIAGNOSIS — B379 Candidiasis, unspecified: Secondary | ICD-10-CM

## 2022-11-02 MED ORDER — FLUCONAZOLE 150 MG PO TABS
150.0000 mg | ORAL_TABLET | Freq: Once | ORAL | 0 refills | Status: AC
Start: 2022-11-02 — End: 2022-11-02

## 2022-11-02 NOTE — Telephone Encounter (Signed)
A prescription for Diflucan was sent to pharmacy.  As you already took Diflucan the day prior to your appointment we will not repeat the dose unless you start to develop symptoms.

## 2022-11-08 ENCOUNTER — Other Ambulatory Visit: Payer: Self-pay | Admitting: Unknown Physician Specialty

## 2022-11-08 DIAGNOSIS — R42 Dizziness and giddiness: Secondary | ICD-10-CM

## 2022-11-15 ENCOUNTER — Telehealth: Payer: Self-pay | Admitting: Neurology

## 2022-11-15 ENCOUNTER — Ambulatory Visit (INDEPENDENT_AMBULATORY_CARE_PROVIDER_SITE_OTHER): Payer: 59

## 2022-11-15 DIAGNOSIS — G43009 Migraine without aura, not intractable, without status migrainosus: Secondary | ICD-10-CM | POA: Diagnosis not present

## 2022-11-15 MED ORDER — METOCLOPRAMIDE HCL 5 MG/ML IJ SOLN
10.0000 mg | Freq: Once | INTRAMUSCULAR | Status: AC
Start: 2022-11-15 — End: 2022-11-15
  Administered 2022-11-15: 10 mg via INTRAMUSCULAR

## 2022-11-15 MED ORDER — DIPHENHYDRAMINE HCL 50 MG/ML IJ SOLN
50.0000 mg | Freq: Once | INTRAMUSCULAR | Status: AC
Start: 2022-11-15 — End: 2022-11-15
  Administered 2022-11-15: 25 mg via INTRAMUSCULAR

## 2022-11-15 MED ORDER — KETOROLAC TROMETHAMINE 60 MG/2ML IM SOLN
60.0000 mg | Freq: Once | INTRAMUSCULAR | Status: AC
  Administered 2022-11-15: 60 mg via INTRAMUSCULAR

## 2022-11-15 NOTE — Telephone Encounter (Signed)
How frequent or the headaches (on average, how many days a week/month are they occurring)?   How long do the headaches last?  3-4 days now Verify what preventative medication and dose you are taking (e.g. topiramate, propranolol, amitriptyline, Emgality, etc)  Vyepti start this Friday Verify which rescue medication you are taking (triptan, Advil, Excedrin, Aleve, Ubrelvy, etc)  Rizatriptan  How often are you taking pain relievers/analgesics/rescue mediction?  Motrin every 6-8 hour.    Patient seen by ENT MRI schedule ad Vertigo testing for Ear pain.    Patient wants to do a headache cocktail for the Migraine. Patient also wanted to make sure that will be okay with her Vyepti appt she has on Friday.

## 2022-11-15 NOTE — Telephone Encounter (Signed)
Patient is having a really bad migraine, and is wanting to come in for a shot? KB

## 2022-11-17 ENCOUNTER — Ambulatory Visit: Payer: 59

## 2022-11-17 VITALS — BP 132/77 | HR 84 | Temp 98.6°F | Resp 18 | Ht 65.5 in | Wt 211.6 lb

## 2022-11-17 DIAGNOSIS — G43709 Chronic migraine without aura, not intractable, without status migrainosus: Secondary | ICD-10-CM

## 2022-11-17 MED ORDER — SODIUM CHLORIDE 0.9 % IV SOLN
100.0000 mg | Freq: Once | INTRAVENOUS | Status: AC
  Administered 2022-11-17: 100 mg via INTRAVENOUS
  Filled 2022-11-17: qty 1

## 2022-11-17 NOTE — Progress Notes (Signed)
Diagnosis: Migraines  Provider:  Chilton Greathouse MD  Procedure: IV Infusion  IV Type: Peripheral, IV Location: R Forearm  Vyepti (Eptinezumab-jjmr), Dose: 100 mg  Infusion Start Time: 1518  Infusion Stop Time: 1551  Post Infusion IV Care: Observation period completed. PIV removed  Discharge: Condition: Good, Destination: Home . AVS Provided  Performed by:  Rico Ala, LPN

## 2022-11-24 ENCOUNTER — Ambulatory Visit
Admission: RE | Admit: 2022-11-24 | Discharge: 2022-11-24 | Disposition: A | Discharge status: Home / Self Care | Payer: 59 | Source: Ambulatory Visit | Attending: Unknown Physician Specialty | Admitting: Unknown Physician Specialty

## 2022-11-24 ENCOUNTER — Telehealth: Payer: Self-pay | Admitting: Neurology

## 2022-11-24 DIAGNOSIS — R42 Dizziness and giddiness: Secondary | ICD-10-CM

## 2022-11-24 MED ORDER — GADOPICLENOL 0.5 MMOL/ML IV SOLN
10.0000 mL | Freq: Once | INTRAVENOUS | Status: AC | PRN
  Administered 2022-11-24: 10 mL via INTRAVENOUS

## 2022-11-24 MED ORDER — PREDNISONE 10 MG (21) PO TBPK: ORAL_TABLET | ORAL | 0 refills | Status: DC

## 2022-11-24 NOTE — Telephone Encounter (Signed)
Pt called no answer left a voice mail to call the office back  °

## 2022-11-24 NOTE — Telephone Encounter (Signed)
Patient is requesting an appt before her oct appt. States her ENT said she needs to see jaffe due to her having increased migraines/ear aches. She had an infusion as well with no luck. She would like to know if she keeps having migraines, will she able to come next week for a cocktail

## 2022-11-24 NOTE — Telephone Encounter (Signed)
Pt called she was advised that Dr Everlena Cooper stated we can try a prednisone dose pack, she said yes please , prednisone sent to [facility] on [address]. Pt said she had tried the Elyxb once she would try it again,

## 2022-11-24 NOTE — Telephone Encounter (Signed)
How frequent or the headaches (on average, how many days a week/month are they occurring)?  5-6 day out of the week with ear pain has seen ent. Unsure if its the heat, has MRI scheduled for 11/24/22 @ 3pm How long do the headaches last?  Lasting for day one lasted 3 day took a maxalt then woke up with one  Verify what preventative medication and dose you are taking (e.g. topiramate, propranolol, amitriptyline, Emgality, etc)  gabapentin  coming off of it taken it at night Verify which rescue medication you are taking (triptan, Advil, Excedrin, Aleve, Ubrelvy, etc)  maxalt 10 mg  motrin( not in the past  week)  How often are you taking pain relievers/analgesics/rescue mediction?  Maxalt 2 a day twice a week.   She does not have  driver to come in for a cocktail today

## 2022-11-28 NOTE — Progress Notes (Deleted)
Follow Up Note  RE: MCCOY CHAO MRN: [id number] DOB: Jul 07, 1965 Date of Office Visit: 11/29/2022  Referring provider: Karie Georges, MD Primary care provider: Karie Georges, MD  Chief Complaint: No chief complaint on file.  History of Present Illness: I had the pleasure of seeing [name] for a follow up visit at the [facility] on 11/28/2022. She is a 57 y.o. female, who is being followed for asthma, atelectasis, rash, chronic rhinitis, GERD. Her previous allergy office visit was on 11/14/2021 with Dr. Selena Batten. Today is a regular follow up visit.  Severe persistent asthma without complication Past history - Spiriva caused coughing. Normal alpha-1 level. Seen by pulmonary.Started Tezspire on 03/31/21 Interim history - Covid-19 in May requiring prednisone. Sometime forgets to take Symbicort.  Today's spirometry showed restriction - worse than previous one.  Daily controller medication(s): Start Breo 1 puff one a day and rinse mouth after each use. Let me know if not covered.  STOP Singulair.  Do the flutter valve and incentive spirometer. Continue Tezspire injection every 4 weeks at home.  During upper respiratory infections/asthma flares:  Start Budesonide 0.5mg  nebulizer twice a day for 1-2 weeks until your breathing symptoms return to baseline.  Pretreat with albuterol 2 puffs or albuterol nebulizer.  If you need to use your albuterol nebulizer machine back to back within 15-30 minutes with no relief then please go to the ER/urgent care for further evaluation.  May use albuterol rescue inhaler 2 puffs every 4 to 6 hours as needed for shortness of breath, chest tightness, coughing, and wheezing. May use albuterol rescue inhaler 2 puffs 5 to 15 minutes prior to strenuous physical activities. Monitor frequency of use.  Keep track of infections/bronchitis. Get spirometry at next visit.   Atelectasis Past history - 9/9/202 CT chest showed: 1.  Prominent elevation of the right hemidiaphragm with associated right lung base atelectasis including complete right middle lobe atelectasis. Right hemidiaphragm paralysis not excluded. Saw pulm. Encouraged using flutter valve and incentive spirometer.    Rash and other nonspecific skin eruption New rash - patient thinks it's from new medication Zonisamide. Take allegra 180mg  once a day.  If rash does not improve let us know. Take pictures.  Stop Zonisamide.    Vaginal yeast infection Take diflucan 150mg  and if not better in 72 hours then take another dose.   Chronic rhinitis Past history - All seasonal and perennial aeroallergen skin tests are negative despite a positive histamine control.  Tried Flonase, Xhance, Astelin, Xyzal, Periactin, and Claritin without adequate symptom relief.  Interm history - antihistamines all too drying. May use Flonase (fluticasone) nasal spray 1 spray per nostril twice a day as needed for nasal congestion.  Nasal saline spray (i.e., Simply Saline) or nasal saline lavage (i.e., NeilMed) is recommended as needed and prior to medicated nasal sprays.   Gastroesophageal reflux disease Stable.  Continue reflux lifestyle modifications and diet.  Continue Nexium 40mg  daily in the morning.    Migraine Follow up with PCP and neurology regarding this.   Return in about 4 months (around 03/17/2022).  Assessment and Plan: Alyssa Clay is a 57 y.o. female with: No diagnosis found.  Discharge Instructions   None    No follow-ups on file.  No orders of the defined types were placed in this encounter.  Lab Orders  No laboratory test(s) ordered today    Diagnostics: Spirometry:  Tracings reviewed. Her effort: {Blank single:19197::"Good reproducible efforts.","It was hard to get  consistent efforts and there is a question as to whether this reflects a maximal maneuver.","Poor effort, data can not be interpreted."} FVC: ***L FEV1: ***L, ***% predicted FEV1/FVC  ratio: ***% Interpretation: {Blank single:19197::"Spirometry consistent with mild obstructive disease","Spirometry consistent with moderate obstructive disease","Spirometry consistent with severe obstructive disease","Spirometry consistent with possible restrictive disease","Spirometry consistent with mixed obstructive and restrictive disease","Spirometry uninterpretable due to technique","Spirometry consistent with normal pattern","No overt abnormalities noted given today's efforts"}.  Please see scanned spirometry results for details.  Skin Testing: {Blank single:19197::"Select foods","Environmental allergy panel","Environmental allergy panel and select foods","Food allergy panel","None","Deferred due to recent antihistamines use"}. *** Results discussed with patient/family.   Medication List:  Current Outpatient Medications  Medication Sig Dispense Refill   albuterol (PROVENTIL) (2.5 MG/3ML) 0.083% nebulizer solution Take 3 mLs (2.5 mg total) by nebulization every 4 (four) hours as needed for wheezing or shortness of breath (coughing fits). 75 mL 2   albuterol (VENTOLIN HFA) 108 (90 Base) MCG/ACT inhaler Inhale 2 puffs into the lungs every 4 (four) hours as needed for wheezing or shortness of breath (coughing fits). 18 g 1   budesonide (PULMICORT) 0.5 MG/2ML nebulizer solution TAKE 2 MLS (0.5 MG TOTAL) BY NEBULIZATION IN THE MORNING AND AT BEDTIME. TAKE DURING UPPER RESPIRATORY INFECTIONS FOR 1-2 WEEKS AT A TIME. 360 mL 0   buPROPion (WELLBUTRIN XL) 150 MG 24 hr tablet TAKE 1 TABLET BY MOUTH DAILY 90 tablet 1   cyclobenzaprine (FLEXERIL) 10 MG tablet Take 10 mg by mouth 3 (three) times daily as needed (migraines).     diazepam (VALIUM) 5 MG tablet Take 1 tablet (5 mg total) by mouth 2 (two) times daily as needed for anxiety. 10 tablet 0   DULoxetine (CYMBALTA) 60 MG capsule Take 1 capsule (60 mg total) by mouth daily. 90 capsule 3   Estradiol (YUVAFEM) 10 MCG TABS vaginal tablet Place 1 tablet  (10 mcg total) vaginally daily. 8 tablet 0   fluticasone furoate-vilanterol (BREO ELLIPTA) 200-25 MCG/ACT AEPB Inhale 1 puff into the lungs daily. Rinse mouth after each use. 60 each 5   Fremanezumab-vfrm (AJOVY) 225 MG/1.5ML SOAJ Inject 225 mg into the skin every 28 (twenty-eight) days. 1.68 mL 11   gabapentin (NEURONTIN) 300 MG capsule Take 600 mg by mouth at bedtime. Taking 2 tablets daily     meclizine (ANTIVERT) 25 MG tablet Take 1 tablet (25 mg total) by mouth 3 (three) times daily as needed for dizziness. 30 tablet 0   Multiple Vitamin (MULTIVITAMIN WITH MINERALS) TABS tablet Take 1 tablet by mouth daily.     ondansetron (ZOFRAN) 4 MG tablet Take 1 tablet (4 mg total) by mouth every 8 (eight) hours as needed for nausea or vomiting. 20 tablet 5   predniSONE (STERAPRED UNI-PAK 21 TAB) 10 MG (21) TBPK tablet take 60mg  day 1, then 50mg  day 2, then 40mg  day 3, then 30mg  day 4, then 20mg  day 5, then 10mg  day 6, then STOP 21 tablet 0   Respiratory Therapy Supplies (FLUTTER) DEVI Use 3 times daily as directed 1 each 0   rizatriptan (MAXALT) 10 MG tablet Take 1 tablet (10 mg total) by mouth as needed for migraine. May repeat in 2 hours if needed.  Maximum 2 tablets in 24 hours. 10 tablet 5   Semaglutide-Weight Management (WEGOVY) 2.4 MG/0.75ML SOAJ Inject 2.4 mg into the skin once a week. 3 mL 5   Spacer/Aero-Holding Chambers (AEROCHAMBER MV) inhaler Use as instructed 1 each 1   Tezepelumab-ekko (TEZSPIRE) 210 MG/1. SOAJ Inject 210  mg into the skin every 28 (twenty-eight) days. 1.91 mL 11   traZODone (DESYREL) 100 MG tablet TAKE 1 TO 2 TABLETS BY MOUTH AT  BEDTIME AS NEEDED. FOR SLEEP 90 tablet 7   tretinoin (RETIN-A) 0.05 % cream Apply topically.     valACYclovir (VALTREX) 1000 MG tablet Take 1 tablet (1,000 mg total) by mouth 2 (two) times daily. 14 tablet 5   Current Facility-Administered Medications  Medication Dose Route Frequency Provider Last Rate Last Admin   tezepelumab-ekko (TEZSPIRE)  210 MG/1. syringe 210 mg  210 mg Subcutaneous Q28 days Ellamae Sia, DO   210 mg at 08/19/21 1138   Allergies: Allergies  Allergen Reactions   Penicillins Hives and Other (See Comments)    Yeast infections Yeast infections   Pt wants listed as an allergy    Zonisamide Hives   I reviewed her past medical history, social history, family history, and environmental history and no significant changes have been reported from her previous visit.  Review of Systems  Constitutional:  Negative for appetite change, chills, fever and unexpected weight change.  HENT:  Negative for congestion and rhinorrhea.   Eyes:  Negative for itching.  Respiratory:  Negative for cough, chest tightness, shortness of breath and wheezing.   Gastrointestinal:  Negative for abdominal pain.  Skin:  Negative for rash.  Allergic/Immunologic: Positive for environmental allergies.  Neurological:  Positive for headaches.    Objective: LMP 12/20/2018  There is no height or weight on file to calculate BMI. Physical Exam Vitals and nursing note reviewed.  Constitutional:      Appearance: Normal appearance. She is well-developed.  HENT:     Head: Normocephalic and atraumatic.     Right Ear: Tympanic membrane and external ear normal.     Left Ear: Tympanic membrane and external ear normal.     Nose: Nose normal.     Mouth/Throat:     Mouth: Mucous membranes are moist.     Pharynx: Oropharynx is clear.  Eyes:     Conjunctiva/sclera: Conjunctivae normal.  Cardiovascular:     Rate and Rhythm: Normal rate and regular rhythm.     Heart sounds: Normal heart sounds. No murmur heard. Pulmonary:     Effort: Pulmonary effort is normal.     Breath sounds: Normal breath sounds. No wheezing, rhonchi or rales.  Musculoskeletal:     Cervical back: Neck supple.  Skin:    General: Skin is warm.     Findings: No rash.  Neurological:     Mental Status: She is alert and oriented to person, place, and time.  Psychiatric:         Behavior: Behavior normal.    Previous notes and tests were reviewed. The plan was reviewed with the patient/family, and all questions/concerned were addressed.  It was my pleasure to see Alyssa Clay today and participate in her care. Please feel free to contact me with any questions or concerns.  Sincerely,  Wyline Mood, DO Allergy & Immunology  [facility] office: [phone] [facility] office: [phone]

## 2022-11-29 ENCOUNTER — Ambulatory Visit: Payer: 59 | Admitting: Allergy

## 2022-12-14 ENCOUNTER — Other Ambulatory Visit (HOSPITAL_COMMUNITY): Payer: Self-pay

## 2022-12-17 NOTE — Progress Notes (Unsigned)
Follow Up Note  RE: Alyssa Clay MRN: [id number] DOB: [birth year] Date of Office Visit: 12/18/2022  Referring provider: Karie Georges, MD Primary care provider: Karie Georges, MD  Chief Complaint: No chief complaint on file.  History of Present Illness: I had the pleasure of seeing Alyssa Clay for a follow up visit at the [facility] on 12/17/2022. She is a 57 y.o. female, who is being followed for asthma, atelectasis, rash, chronic rhinitis, GERD. Her previous allergy office visit was on 11/14/2021 with Dr. Selena Batten. Today is a regular follow up visit.  Severe persistent asthma without complication Past history - Spiriva caused coughing. Normal alpha-1 level. Seen by pulmonary.Started Tezspire on 03/31/21 Interim history - Covid-19 in May requiring prednisone. Sometime forgets to take Symbicort.  Today's spirometry showed restriction - worse than previous one.  Daily controller medication(s): Start Breo 1 puff one a day and rinse mouth after each use. Let me know if not covered.  STOP Singulair.  Do the flutter valve and incentive spirometer. Continue Tezspire injection every 4 weeks at home.  During upper respiratory infections/asthma flares:  Start Budesonide 0.5mg  nebulizer twice a day for 1-2 weeks until your breathing symptoms return to baseline.  Pretreat with albuterol 2 puffs or albuterol nebulizer.  If you need to use your albuterol nebulizer machine back to back within 15-30 minutes with no relief then please go to the ER/urgent care for further evaluation.  May use albuterol rescue inhaler 2 puffs every 4 to 6 hours as needed for shortness of breath, chest tightness, coughing, and wheezing. May use albuterol rescue inhaler 2 puffs 5 to 15 minutes prior to strenuous physical activities. Monitor frequency of use.  Keep track of infections/bronchitis. Get spirometry at next visit.   Atelectasis Past history - 9/9/202 CT chest showed: 1.  Prominent elevation of the right hemidiaphragm with associated right lung base atelectasis including complete right middle lobe atelectasis. Right hemidiaphragm paralysis not excluded. Saw pulm. Encouraged using flutter valve and incentive spirometer.    Rash and other nonspecific skin eruption New rash - patient thinks it's from new medication Zonisamide. Take allegra 180mg  once a day.  If rash does not improve let us know. Take pictures.  Stop Zonisamide.    Vaginal yeast infection Take diflucan 150mg  and if not better in 72 hours then take another dose.   Chronic rhinitis Past history - All seasonal and perennial aeroallergen skin tests are negative despite a positive histamine control.  Tried Flonase, Xhance, Astelin, Xyzal, Periactin, and Claritin without adequate symptom relief.  Interm history - antihistamines all too drying. May use Flonase (fluticasone) nasal spray 1 spray per nostril twice a day as needed for nasal congestion.  Nasal saline spray (i.e., Simply Saline) or nasal saline lavage (i.e., NeilMed) is recommended as needed and prior to medicated nasal sprays.   Gastroesophageal reflux disease Stable.  Continue reflux lifestyle modifications and diet.  Continue Nexium 40mg  daily in the morning.    Migraine Follow up with PCP and neurology regarding this.  Assessment and Plan: Alyssa Clay is a 57 y.o. female with: ***  No follow-ups on file.  No orders of the defined types were placed in this encounter.  Lab Orders  No laboratory test(s) ordered today    Diagnostics: Spirometry:  Tracings reviewed. Her effort: {Blank single:19197::"Good reproducible efforts.","It was hard to get consistent efforts and there is a question as to whether this reflects a maximal maneuver.","Poor effort, data can not  be interpreted."} FVC: ***L FEV1: ***L, ***% predicted FEV1/FVC ratio: ***% Interpretation: {Blank single:19197::"Spirometry consistent with mild obstructive  disease","Spirometry consistent with moderate obstructive disease","Spirometry consistent with severe obstructive disease","Spirometry consistent with possible restrictive disease","Spirometry consistent with mixed obstructive and restrictive disease","Spirometry uninterpretable due to technique","Spirometry consistent with normal pattern","No overt abnormalities noted given today's efforts"}.  Please see scanned spirometry results for details.  Skin Testing: {Blank single:19197::"Select foods","Environmental allergy panel","Environmental allergy panel and select foods","Food allergy panel","None","Deferred due to recent antihistamines use"}. *** Results discussed with patient/family.   Medication List:  Current Outpatient Medications  Medication Sig Dispense Refill   albuterol (PROVENTIL) (2.5 MG/3ML) 0.083% nebulizer solution Take 3 mLs (2.5 mg total) by nebulization every 4 (four) hours as needed for wheezing or shortness of breath (coughing fits). 75 mL 2   albuterol (VENTOLIN HFA) 108 (90 Base) MCG/ACT inhaler Inhale 2 puffs into the lungs every 4 (four) hours as needed for wheezing or shortness of breath (coughing fits). 18 g 1   budesonide (PULMICORT) 0.5 MG/2ML nebulizer solution TAKE 2 MLS (0.5 MG TOTAL) BY NEBULIZATION IN THE MORNING AND AT BEDTIME. TAKE DURING UPPER RESPIRATORY INFECTIONS FOR 1-2 WEEKS AT A TIME. 360 mL 0   buPROPion (WELLBUTRIN XL) 150 MG 24 hr tablet TAKE 1 TABLET BY MOUTH DAILY 90 tablet 1   cyclobenzaprine (FLEXERIL) 10 MG tablet Take 10 mg by mouth 3 (three) times daily as needed (migraines).     diazepam (VALIUM) 5 MG tablet Take 1 tablet (5 mg total) by mouth 2 (two) times daily as needed for anxiety. 10 tablet 0   DULoxetine (CYMBALTA) 60 MG capsule Take 1 capsule (60 mg total) by mouth daily. 90 capsule 3   Estradiol (YUVAFEM) 10 MCG TABS vaginal tablet Place 1 tablet (10 mcg total) vaginally daily. 8 tablet 0   fluticasone furoate-vilanterol (BREO ELLIPTA)  200-25 MCG/ACT AEPB Inhale 1 puff into the lungs daily. Rinse mouth after each use. 60 each 5   Fremanezumab-vfrm (AJOVY) 225 MG/1.5ML SOAJ Inject 225 mg into the skin every 28 (twenty-eight) days. 1.68 mL 11   gabapentin (NEURONTIN) 300 MG capsule Take 600 mg by mouth at bedtime. Taking 2 tablets daily     meclizine (ANTIVERT) 25 MG tablet Take 1 tablet (25 mg total) by mouth 3 (three) times daily as needed for dizziness. 30 tablet 0   Multiple Vitamin (MULTIVITAMIN WITH MINERALS) TABS tablet Take 1 tablet by mouth daily.     ondansetron (ZOFRAN) 4 MG tablet Take 1 tablet (4 mg total) by mouth every 8 (eight) hours as needed for nausea or vomiting. 20 tablet 5   predniSONE (STERAPRED UNI-PAK 21 TAB) 10 MG (21) TBPK tablet take 60mg  day 1, then 50mg  day 2, then 40mg  day 3, then 30mg  day 4, then 20mg  day 5, then 10mg  day 6, then STOP 21 tablet 0   Respiratory Therapy Supplies (FLUTTER) DEVI Use 3 times daily as directed 1 each 0   rizatriptan (MAXALT) 10 MG tablet Take 1 tablet (10 mg total) by mouth as needed for migraine. May repeat in 2 hours if needed.  Maximum 2 tablets in 24 hours. 10 tablet 5   Semaglutide-Weight Management (WEGOVY) 2.4 MG/0.75ML SOAJ Inject 2.4 mg into the skin once a week. 3 mL 5   Spacer/Aero-Holding Chambers (AEROCHAMBER MV) inhaler Use as instructed 1 each 1   Tezepelumab-ekko (TEZSPIRE) 210 MG/1. SOAJ Inject 210 mg into the skin every 28 (twenty-eight) days. 1.91 mL 11   traZODone (DESYREL) 100 MG tablet TAKE  1 TO 2 TABLETS BY MOUTH AT  BEDTIME AS NEEDED. FOR SLEEP 90 tablet 7   tretinoin (RETIN-A) 0.05 % cream Apply topically.     valACYclovir (VALTREX) 1000 MG tablet Take 1 tablet (1,000 mg total) by mouth 2 (two) times daily. 14 tablet 5   Current Facility-Administered Medications  Medication Dose Route Frequency Provider Last Rate Last Admin   tezepelumab-ekko (TEZSPIRE) 210 MG/1. syringe 210 mg  210 mg Subcutaneous Q28 days Ellamae Sia, DO   210 mg at  08/19/21 1138   Allergies: Allergies  Allergen Reactions   Penicillins Hives and Other (See Comments)    Yeast infections Yeast infections   Pt wants listed as an allergy    Zonisamide Hives   I reviewed her past medical history, social history, family history, and environmental history and no significant changes have been reported from her previous visit.  Review of Systems  Constitutional:  Negative for appetite change, chills, fever and unexpected weight change.  HENT:  Negative for congestion and rhinorrhea.   Eyes:  Negative for itching.  Respiratory:  Negative for cough, chest tightness, shortness of breath and wheezing.   Gastrointestinal:  Negative for abdominal pain.  Skin:  Negative for rash.  Allergic/Immunologic: Positive for environmental allergies.  Neurological:  Positive for headaches.    Objective: LMP 12/20/2018  There is no height or weight on file to calculate BMI. Physical Exam Vitals and nursing note reviewed.  Constitutional:      Appearance: Normal appearance. She is well-developed.  HENT:     Head: Normocephalic and atraumatic.     Right Ear: Tympanic membrane and external ear normal.     Left Ear: Tympanic membrane and external ear normal.     Nose: Nose normal.     Mouth/Throat:     Mouth: Mucous membranes are moist.     Pharynx: Oropharynx is clear.  Eyes:     Conjunctiva/sclera: Conjunctivae normal.  Cardiovascular:     Rate and Rhythm: Normal rate and regular rhythm.     Heart sounds: Normal heart sounds. No murmur heard. Pulmonary:     Effort: Pulmonary effort is normal.     Breath sounds: Normal breath sounds. No wheezing, rhonchi or rales.  Musculoskeletal:     Cervical back: Neck supple.  Skin:    General: Skin is warm.     Findings: No rash.  Neurological:     Mental Status: She is alert and oriented to person, place, and time.  Psychiatric:        Behavior: Behavior normal.    Previous notes and tests were reviewed. The  plan was reviewed with the patient/family, and all questions/concerned were addressed.  It was my pleasure to see Alyssa Clay today and participate in her care. Please feel free to contact me with any questions or concerns.  Sincerely,  Wyline Mood, DO Allergy & Immunology  [facility] office: [phone] [facility] office: [phone]

## 2022-12-18 ENCOUNTER — Ambulatory Visit (INDEPENDENT_AMBULATORY_CARE_PROVIDER_SITE_OTHER): Payer: 59 | Admitting: Allergy

## 2022-12-18 ENCOUNTER — Encounter: Payer: Self-pay | Admitting: Allergy

## 2022-12-18 VITALS — BP 122/78 | HR 66 | Temp 98.1°F | Resp 18 | Ht 64.5 in | Wt 213.0 lb

## 2022-12-18 DIAGNOSIS — K219 Gastro-esophageal reflux disease without esophagitis: Secondary | ICD-10-CM

## 2022-12-18 DIAGNOSIS — J310 Chronic rhinitis: Secondary | ICD-10-CM

## 2022-12-18 DIAGNOSIS — J9811 Atelectasis: Secondary | ICD-10-CM

## 2022-12-18 DIAGNOSIS — B999 Unspecified infectious disease: Secondary | ICD-10-CM

## 2022-12-18 DIAGNOSIS — J4550 Severe persistent asthma, uncomplicated: Secondary | ICD-10-CM | POA: Diagnosis not present

## 2022-12-18 MED ORDER — ALBUTEROL SULFATE HFA 108 (90 BASE) MCG/ACT IN AERS: 2.0000 | INHALATION_SPRAY | RESPIRATORY_TRACT | 1 refills | Status: DC | PRN

## 2022-12-18 MED ORDER — ALBUTEROL SULFATE (2.5 MG/3ML) 0.083% IN NEBU: 2.5000 mg | INHALATION_SOLUTION | RESPIRATORY_TRACT | 1 refills | Status: DC | PRN

## 2022-12-18 MED ORDER — FLUTICASONE FUROATE-VILANTEROL 200-25 MCG/ACT IN AEPB: 1.0000 | INHALATION_SPRAY | Freq: Every day | RESPIRATORY_TRACT | 5 refills | Status: DC

## 2022-12-18 MED ORDER — BUDESONIDE 0.5 MG/2ML IN SUSP: 0.5000 mg | Freq: Two times a day (BID) | RESPIRATORY_TRACT | 2 refills | Status: DC

## 2022-12-18 NOTE — Patient Instructions (Addendum)
Asthma/atelectasis Daily controller medication(s): restart Breo 1 puff once a day and rinse mouth after each use. Do the flutter valve and incentive spirometer. Restart Tezspire injection every 4 weeks at home.  Tammy will be in touch with you regarding coverage.  During respiratory infections/asthma flares:  Start Budesonide 0.5mg  nebulizer twice a day for 1-2 weeks until your breathing symptoms return to baseline.  Pretreat with albuterol 2 puffs or albuterol nebulizer.  If you need to use your albuterol nebulizer machine back to back within 15-30 minutes with no relief then please go to the ER/urgent care for further evaluation.  May use albuterol rescue inhaler 2 puffs or nebulizer every 4 to 6 hours as needed for shortness of breath, chest tightness, coughing, and wheezing. May use albuterol rescue inhaler 2 puffs 5 to 15 minutes prior to strenuous physical activities. Monitor frequency of use - if you need to use it more than twice per week on a consistent basis let us know.  Asthma control goals:  Full participation in all desired activities (may need albuterol before activity) Albuterol use two times or less a week on average (not counting use with activity) Cough interfering with sleep two times or less a month Oral steroids no more than once a year No hospitalizations  Keep track of infections/bronchitis.  Chronic rhinitis Use Flonase (fluticasone) nasal spray 1-2 sprays per nostril once a day as needed for nasal congestion.  Nasal saline spray (i.e., Simply Saline) or nasal saline lavage (i.e., NeilMed) is recommended as needed and prior to medicated nasal sprays.   Gastroesophageal reflux disease Continue reflux lifestyle modifications and diet.  Let me know what you are taking.   Follow up in 4 months or sooner if needed.  Recommend flu shot in the fall.

## 2022-12-19 ENCOUNTER — Telehealth: Payer: Self-pay

## 2022-12-19 ENCOUNTER — Other Ambulatory Visit (HOSPITAL_COMMUNITY): Payer: Self-pay

## 2022-12-19 ENCOUNTER — Encounter: Payer: Self-pay | Admitting: Neurology

## 2022-12-19 MED ORDER — FLUTICASONE FUROATE-VILANTEROL 200-25 MCG/ACT IN AEPB: 1.0000 | INHALATION_SPRAY | Freq: Every day | RESPIRATORY_TRACT | 5 refills | Status: DC

## 2022-12-19 NOTE — Telephone Encounter (Signed)
Spoke with pt she is ok sending breo to [employer] so sent breo to [employer]

## 2022-12-19 NOTE — Telephone Encounter (Signed)
Pharmacy Patient Advocate Encounter   Received notification from CoverMyMeds that prior authorization for Fluticasone Furoate-Vilanterol 200-25MCG/ACT aerosol powder is required/requested.   Insurance verification completed.   The patient is insured through [facility] .   Per test claim:  Insurance pays max 2 fills at retail level then must use mail order. Patient to call [phone]

## 2022-12-21 NOTE — Progress Notes (Deleted)
NEUROLOGY FOLLOW UP OFFICE NOTE  Alyssa Clay [id number]  Assessment/Plan:   Migraine without aura, without status migrainosus, intractable Right vestibular neuronitis    Toradol 60mg  injection today Migraine prevention:  Ajovy and acupuncture.   Migraine rescue:  rizatriptan and zofran - will have her try samples of Elxyb to take with rizatriptan Limit use of pain relievers to no more than 2 days out of week to prevent risk of rebound or medication-overuse headache. Keep headache diary Follow up 6 months.         Subjective:  Alyssa Clay is a 57 year old female with asthma, depression who follows up for migraines.   UPDATE: She saw ENT (Dr. Jenne Campus) for right ear pain, aural fullness and dizziness.  Audiometric testing was normal.  MRI of brain and IAC with and without contrast on 11/24/2022 personally reviewed was normal.    Intensity:  moderate to severe Duration:  6-8 hours with Maxalt (does repeat dose) Frequency:  2-3 days a month. Has migraine now for 2 days.  Did not take Maxalt at earliest onset.    Mostly migraines have been right periorbital now.  Frequency of abortive medication: as needed Current NSAIDS/analgesics:  none Current triptans:  none Current ergotamine: none Current anti-emetic:  Compazine 10mg  Current muscle relaxants:  Flexeril 10mg  (neck and head soreness) Current Antihypertensive medications:  none Current Antidepressant medications:  Cymbalta 60mg  daily, Wellbutrin, trazodone Current Anticonvulsant medications:  gabapentin 300 in AM and 900mg  in PM Current anti-CGRP:  Ajovy Current Vitamins/Herbal/Supplements:  none Current Antihistamines/Decongestants:  none Other therapy:  acupuncture birth control:  none   Caffeine:  3 cups of coffee daily Diet:  Hydrates.  No soda Exercise:  goes to gym (2 cardio days, 2 weight lifting days, speed weight day) Depression:  yes; Anxiety:  yes - difficult year.  Several family members passed  away this year. Sleep hygiene:  good with trazodone   HISTORY:  Onset:  41 or 57 years old with onset of menses menstrual until 26 and then more often.  Pregnant at 27 -  Location:  frontal, temporal, top of head bilaterally Quality:  squeezing, stabbing Intensity:  moderate to severe Aura:  sometimes zigzag pattern in visioin Prodrome:  absent Associated symptoms:  Photophobia, phonophobia, osmophobia.  She denies associated nausea, vomiting, autonomic symptoms, unilateral numbness or weakness. Duration:  1 to 3 days Frequency:  5 to 12 a month Triggers:  bright lights, stress Relieving factors:  rest, dark room Activity:  aggravates   In early April 2024, she developed a viral ilness, including low-grade fever, sore throat, and headache. A couple of days, she developed dizziness, described as a spinning sensation triggered by change in position. Labs for COVID, flu and strep A were negative. She was prescribed meclizine and given instructions for home Epley maneuver which were ineffective. She saw PT. Exam revealed right vestibular hypofunction, including positive right Head Impulse Test. She went to two sessions of vestibular rehab with PT which were not helpful. Always experiences sensation of dizziness but dull when still, aggravated with movement. Associated nausea. Denies aural fullness, otalgia, tinnitus or hearing loss.    Past NSAIDS/analgesics:  ibuprofen, Tylenol, naproxen, Fioricet, Codeine, darvocet, Norco, Tylenol #3, tramadol, Midrin, toradol Past abortive triptans:  Amerge, Frova, Relpax, sumatriptan tab/[place], Maxalt, Zomig, Treximet Past abortive ergotamine: Trudhesa Past muscle relaxants:  tizanidine, skelaxin Past anti-emetic:  promethazine Past antihypertensive medications:  propranolol, atenolol, verapamil, metoprolol Past antidepressant medications:  amitriptyline, sertraline, Lexapro, Celexa, Prozac  Past anticonvulsant medications:  topiramate, Depakote,  zonisamide Past anti-CGRP:  Nicolasa Ducking, Nurtec, Aimovig, Zavzpret Past vitamins/Herbal/Supplements:  Migrelief BID Past antihistamines/decongestants:  none Other past therapies:  Botox, sphenopalatine ganglion block, trigger point injections, dry needling     Family history of headache:  mom, siblings, aunts, cousins  PAST MEDICAL HISTORY: Past Medical History:  Diagnosis Date   Allergy    Anemia    Asthma    Constipation    issues x the past month -? 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 75 mL 1   albuterol (VENTOLIN HFA) 108 (90 Base) MCG/ACT inhaler Inhale 2 puffs into the lungs every 4 (four) hours as needed for wheezing or shortness of breath (coughing fits). 18 g 1   budesonide (PULMICORT) 0.5 MG/2ML nebulizer solution Take 2 mLs (0.5 mg total) by nebulization in the morning and at bedtime. Take for 1-2 weeks during asthma flare. 120 mL 2   buPROPion (WELLBUTRIN XL) 150 MG 24 hr tablet TAKE 1 TABLET BY MOUTH DAILY 90 tablet 1   cyclobenzaprine (FLEXERIL) 10 MG tablet Take 10 mg by mouth 3 (three) times daily as needed (migraines).     DULoxetine (CYMBALTA) 60 MG capsule Take 1 capsule (60 mg total) by mouth daily. 90 capsule 3   Estradiol (YUVAFEM) 10 MCG TABS vaginal tablet Place 1 tablet (10 mcg total) vaginally daily. 8 tablet 0   fluticasone furoate-vilanterol (BREO ELLIPTA) 200-25 MCG/ACT AEPB Inhale 1 puff into the lungs  daily. Rinse mouth after each use. 60 each 5   gabapentin (NEURONTIN) 300 MG capsule Take 600 mg by mouth at bedtime. Taking 2 tablets daily     gentamicin ointment (GARAMYCIN) 0.1 % Apply 1 Application topically 3 (three) times daily.     Multiple Vitamin (MULTIVITAMIN WITH MINERALS) TABS tablet Take 1 tablet by mouth daily.     Respiratory Therapy Supplies (FLUTTER) DEVI Use 3 times daily as directed 1 each 0   rizatriptan (MAXALT) 10 MG tablet Take 1 tablet (10 mg total) by mouth as needed for migraine. May repeat in 2 hours if needed.  Maximum 2 tablets in 24 hours. 10 tablet 5   Semaglutide-Weight Management (WEGOVY) 2.4 MG/0.75ML SOAJ Inject 2.4 mg into the skin once a week. 3 mL 5   Spacer/Aero-Holding Chambers (AEROCHAMBER MV) inhaler Use as instructed 1 each 1   Tezepelumab-ekko (TEZSPIRE) 210 MG/1. SOAJ Inject 210 mg into the skin every 28 (twenty-eight) days. 1.91 mL 11   traZODone (DESYREL) 100 MG tablet TAKE 1 TO 2 TABLETS BY MOUTH AT  BEDTIME AS NEEDED. FOR SLEEP 90 tablet 7   tretinoin (RETIN-A) 0.05 % cream Apply topically.     valACYclovir (VALTREX) 1000 MG tablet Take 1 tablet (1,000 mg total) by mouth 2 (two) times daily. 14 tablet 5   Current Facility-Administered Medications on File Prior to Visit  Medication  Dose Route Frequency Provider Last Rate Last Admin   tezepelumab-ekko (TEZSPIRE) 210 MG/1. syringe 210 mg  210 mg Subcutaneous Q28 days Ellamae Sia, DO   210 mg at 08/19/21 1138    ALLERGIES: Allergies  Allergen Reactions   Penicillins Hives and Other (See Comments)    Yeast infections Yeast infections   Pt wants listed as an allergy    Zonisamide Hives    FAMILY HISTORY: Family History  Problem Relation Age of Onset   Depression Mother    Lymphoma Mother 80   Lung cancer Mother    Hypertension Father    Heart attack Father 33   Hypertension Brother    High blood pressure Brother    High Cholesterol Brother    Healthy Daughter    Colon  cancer Maternal Aunt 53   Esophageal cancer Neg Hx    Pancreatic cancer Neg Hx    Stomach cancer Neg Hx    Colon polyps Neg Hx    Rectal cancer Neg Hx       Objective:  *** General: No acute distress.  Patient appears well-groomed.   Head:  Normocephalic/atraumatic Eyes:  Fundi examined but not visualized Neck: supple, no paraspinal tenderness, full range of motion Heart:  Regular rate and rhythm Neurological Exam: ***   Shon Millet, DO  CC: Nira Conn, MD

## 2022-12-22 ENCOUNTER — Encounter: Payer: Self-pay | Admitting: Family Medicine

## 2022-12-26 ENCOUNTER — Ambulatory Visit: Payer: 59 | Admitting: Neurology

## 2022-12-26 ENCOUNTER — Telehealth: Payer: Self-pay | Admitting: Neurology

## 2023-01-03 ENCOUNTER — Telehealth: Payer: Self-pay | Admitting: *Deleted

## 2023-01-03 MED ORDER — TEZSPIRE 210 MG/1.91ML ~~LOC~~ SOAJ: 210.0000 mg | SUBCUTANEOUS | 11 refills | Status: DC

## 2023-01-03 NOTE — Telephone Encounter (Signed)
Called patient and advised approval for restart of Tezspire and new Rx for Pen to [facility] with instructions on delivery, storage and dosing of same

## 2023-01-03 NOTE — Telephone Encounter (Signed)
-----   Message from Ellamae Sia sent at 12/18/2022 11:58 AM EDT ----- Patient off Dorothea Ogle for 6 months and wants to restart. At home injections. Thank you.

## 2023-02-09 ENCOUNTER — Other Ambulatory Visit: Payer: Self-pay | Admitting: Family Medicine

## 2023-02-09 DIAGNOSIS — Z1231 Encounter for screening mammogram for malignant neoplasm of breast: Secondary | ICD-10-CM

## 2023-02-09 DIAGNOSIS — I10 Essential (primary) hypertension: Secondary | ICD-10-CM

## 2023-02-09 DIAGNOSIS — Z0000 Encounter for general adult medical examination without abnormal findings: Secondary | ICD-10-CM

## 2023-02-09 DIAGNOSIS — F3341 Major depressive disorder, recurrent, in partial remission: Secondary | ICD-10-CM

## 2023-02-09 DIAGNOSIS — R7303 Prediabetes: Secondary | ICD-10-CM

## 2023-02-09 DIAGNOSIS — N6489 Other specified disorders of breast: Secondary | ICD-10-CM

## 2023-02-09 DIAGNOSIS — E785 Hyperlipidemia, unspecified: Secondary | ICD-10-CM

## 2023-02-13 ENCOUNTER — Ambulatory Visit
Admission: RE | Admit: 2023-02-13 | Discharge: 2023-02-13 | Disposition: A | Discharge status: Home / Self Care | Payer: 59 | Source: Ambulatory Visit | Attending: Family Medicine

## 2023-02-13 DIAGNOSIS — N6489 Other specified disorders of breast: Secondary | ICD-10-CM

## 2023-02-14 NOTE — Progress Notes (Signed)
NEUROLOGY FOLLOW UP OFFICE NOTE  Alyssa Clay [id number]  Assessment/Plan:   Chronic migraine without aura, without status migrainosus, not intractable Right vestibular neuritis, resolved  Migraine prevention:  Increase Vyepti to 300mg .  If no improvement, plan would be to retry Botox Migraine rescue:  rizatriptan and Zofran.  I will give her samples of Reyvow 100mg  to try.  Advised no driving for 8 hours after use.   Limit use of pain relievers to no more than 2 days out of week to prevent risk of rebound or medication-overuse headache. Keep headache diary Follow up 4 months.  Subjective:  Alyssa Clay is a 57 year old female with asthma who follows up for migraine and vertigo.  History supplemented by ENTnotes and accompanying husband.    UPDATE: Migraines: Tried Elyxyb.  Ineffective.    Increased migraines over the summer.  Related to stress and the vertigo.   Intensity:  moderate to severe Duration:  5 hours with Elyxyb, occurs in the evening.   Frequency:  5 out of 7 days a week    Mostly migraines have been right periorbital now.  Vertigo: She saw ENT (Dr. Jenne Campus) in July.  Nasal endoscopy was unremarkable.  VNG performed on 11/21/2022 showed abnormal saccades and smooth pursuit but she did not demonstrate nystagmus or subjective dizziness and optokinetic testing, positional testing and calorics were normal.  "Impression:  A central pathology cannot be ruled out based on today's abnormal test findings:  abnormal smooth pursuit and saccades.Marland KitchenMarland KitchenConsidering her improved symptoms, precipitous event to dizziness and normal calorics, a compensated peripheral pathology is likely.  There was no indication of BPPV."  MRI of brain/IAC with and without contrast on 11/24/2022 personally reviewed was normal.  Symptoms resolved.   Medications: Frequency of abortive medication: as needed Current NSAIDS/analgesics:  none Current triptans:  none Current ergotamine: none Current  anti-emetic:  Compazine 10mg  Current muscle relaxants:  Flexeril 10mg  (neck and head soreness) Current Antihypertensive medications:  none Current Antidepressant medications:  Cymbalta 60mg  daily, Wellbutrin, trazodone Current Anticonvulsant medications:  gabapentin 600mg  daily Current anti-CGRP:  Vypeti Current Vitamins/Herbal/Supplements:  none Current Antihistamines/Decongestants:  none Other therapy:  acupuncture birth control:  none   Caffeine:  3 cups of coffee daily Diet:  Hydrates.  No soda Exercise:  goes to gym (2 cardio days, 2 weight lifting days, speed weight day) Depression:  yes; Anxiety:  yes - difficult year.  Several family members passed away this year. Sleep hygiene:  good with trazodone  HISTORY: Migraines: Onset:  62 or 57 years old with onset of menses menstrual until 12 and then more often.  Pregnant at 27 -  Location:  frontal, temporal, top of head bilaterally, now often right periorbital Quality:  squeezing, stabbing Intensity:  moderate to severe Aura:  sometimes zigzag pattern in visioin Prodrome:  absent Associated symptoms:  Photophobia, phonophobia, osmophobia.  She denies associated nausea, vomiting, autonomic symptoms, unilateral numbness or weakness. Duration:  1 to 3 days Frequency:  5 to 12 a month Triggers:  bright lights, stress Relieving factors:  rest, dark room Activity:  aggravates   MRI of brain without contrast on 10/14/2012 was normal    Vertigo: In early April 2024, she developed a viral ilness, including low-grade fever, sore throat, and headache.  A couple of days, she developed dizziness, described as a spinning sensation triggered by change in position.  Labs for COVID, flu and strep A were negative.  She was prescribed meclizine and given instructions for  home Epley maneuver which were ineffective.  She saw PT.  Exam revealed right vestibular hypofunction, including positive right Head Impulse Test.  She went to two sessions of  vestibular rehab with PT which were not helpful.  Always experiences sensation of dizziness but dull when still, aggravated with movement.  Associated nausea.  Denies aural fullness, otalgia, tinnitus or hearing loss.    Past medications: Past NSAIDS/analgesics:  ibuprofen, Tylenol, naproxen, Fioricet, Codeine, darvocet, Norco, Tylenol #3, tramadol, Midrin, toradol, Elyxyb Past abortive triptans:  Amerge, Frova, Relpax, sumatriptan tab/[place], Maxalt, Zomig, Treximet Past abortive ergotamine: Trudhesa Past muscle relaxants:  tizanidine, skelaxin Past anti-emetic:  promethazine Past antihypertensive medications:  propranolol, atenolol, verapamil, metoprolol Past antidepressant medications:  amitriptyline, sertraline, Lexapro, Celexa, Prozac Past anticonvulsant medications:  topiramate, Depakote, zonisamide Past anti-CGRP:  Qulipta, Ubrelvy, Nurtec, Aimovig, Zavzpret Past vitamins/Herbal/Supplements:  Migrelief BID Past antihistamines/decongestants:  none Other past therapies:  Botox (15 years ago, lost efficacy), sphenopalatine ganglion block, trigger point injections, dry needling   PAST MEDICAL HISTORY: Past Medical History:  Diagnosis Date   Allergy    Anemia    Asthma    Constipation    issues x the past month -? 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 75 mL 2   albuterol (VENTOLIN HFA) 108 (90 Base) MCG/ACT inhaler Inhale 2 puffs into the  lungs every 4 (four) hours as needed for wheezing or shortness of breath (coughing fits). 18 g 1   budesonide (PULMICORT) 0.5 MG/2ML nebulizer solution TAKE 2 MLS (0.5 MG TOTAL) BY NEBULIZATION IN THE MORNING AND AT BEDTIME. TAKE DURING UPPER RESPIRATORY INFECTIONS FOR 1-2 WEEKS AT A TIME. 360 mL 0   buPROPion (WELLBUTRIN XL) 150 MG 24 hr tablet Take 1 tablet (150 mg total) by mouth daily. 90 tablet 0   cyclobenzaprine (FLEXERIL) 10 MG tablet Take 10 mg by mouth 3 (three) times daily as needed (migraines).     DULoxetine (CYMBALTA) 60 MG capsule Take 1 capsule (60 mg total) by mouth daily. 90 capsule 3   Estradiol (YUVAFEM) 10 MCG TABS vaginal tablet Place 1 tablet (10 mcg total) vaginally daily. 8 tablet 0   fluticasone furoate-vilanterol (BREO ELLIPTA) 200-25 MCG/ACT AEPB Inhale 1 puff into the lungs daily. Rinse mouth after each use. 60 each 5   Fremanezumab-vfrm (AJOVY) 225 MG/1.5ML SOAJ Inject 225 mg into the skin every 28 (twenty-eight) days. 1.68 mL 11   gabapentin (NEURONTIN) 300 MG capsule Take 300-900 mg by mouth See admin instructions. Take 1 tablet every AM, three tablets at bedtime     meclizine (ANTIVERT) 25 MG tablet Take 1 tablet (25 mg total) by mouth 3 (three) times daily as needed for dizziness. (Patient not taking: Reported on 09/05/2022) 30 tablet 0   Multiple Vitamin (MULTIVITAMIN WITH MINERALS) TABS tablet Take 1 tablet by mouth daily.     ondansetron (ZOFRAN) 4 MG tablet  Take 1 tablet (4 mg total) by mouth every 8 (eight) hours as needed for nausea or vomiting. (Patient not taking: Reported on 09/05/2022) 20 tablet 5   Respiratory Therapy Supplies (FLUTTER) DEVI Use 3 times daily as directed 1 each 0   rizatriptan (MAXALT) 10 MG tablet Take 1 tablet (10 mg total) by mouth as needed for migraine. May repeat in 2 hours if needed.  Maximum 2 tablets in 24 hours. 10 tablet 5   Semaglutide-Weight Management (WEGOVY) 1.7 MG/0.75ML SOAJ Inject 1.7 mg into the skin once a week. 3 mL 11    Spacer/Aero-Holding Chambers (AEROCHAMBER MV) inhaler Use as instructed 1 each 1   Tezepelumab-ekko (TEZSPIRE) 210 MG/1. SOAJ Inject 210 mg into the skin every 28 (twenty-eight) days. 1.91 mL 11   traZODone (DESYREL) 100 MG tablet TAKE 1 TO 2 TABLETS BY MOUTH AT  BEDTIME AS NEEDED. FOR SLEEP 90 tablet 7   tretinoin (RETIN-A) 0.05 % cream Apply topically.     valACYclovir (VALTREX) 1000 MG tablet Take 1 tablet (1,000 mg total) by mouth 2 (two) times daily. 14 tablet 5   Current Facility-Administered Medications on File Prior to Visit  Medication Dose Route Frequency Provider Last Rate Last Admin   tezepelumab-ekko (TEZSPIRE) 210 MG/1. syringe 210 mg  210 mg Subcutaneous Q28 days Ellamae Sia, DO   210 mg at 08/19/21 1138    ALLERGIES: Allergies  Allergen Reactions   Penicillins Hives and Other (See Comments)    Yeast infections Yeast infections   Pt wants listed as an allergy    Zonisamide Hives    FAMILY HISTORY: Family History  Problem Relation Age of Onset   Depression Mother    Lymphoma Mother 56   Lung cancer Mother    Hypertension Father    Heart attack Father 46   Hypertension Brother    High blood pressure Brother    High Cholesterol Brother    Healthy Daughter    Colon cancer Maternal Aunt 32   Esophageal cancer Neg Hx    Pancreatic cancer Neg Hx    Stomach cancer Neg Hx    Colon polyps Neg Hx    Rectal cancer Neg Hx       Objective:  Blood pressure (!) 143/78, pulse 98, resp. rate 18, height 5\' 5"  (1.651 m), weight 214 lb (97.1 kg), last menstrual period 12/20/2018, SpO2 100%. General: No acute distress.  Patient appears well-groomed.      Shon Millet, DO  CC:  Nira Conn, MD

## 2023-02-16 ENCOUNTER — Ambulatory Visit (INDEPENDENT_AMBULATORY_CARE_PROVIDER_SITE_OTHER): Payer: 59 | Admitting: Neurology

## 2023-02-16 ENCOUNTER — Encounter: Payer: Self-pay | Admitting: Neurology

## 2023-02-16 VITALS — BP 143/78 | HR 98 | Resp 18 | Ht 65.0 in | Wt 214.0 lb

## 2023-02-16 DIAGNOSIS — G43709 Chronic migraine without aura, not intractable, without status migrainosus: Secondary | ICD-10-CM

## 2023-02-16 DIAGNOSIS — H8121 Vestibular neuronitis, right ear: Secondary | ICD-10-CM

## 2023-02-16 MED ORDER — REYVOW 100 MG PO TABS: ORAL_TABLET | ORAL | Status: DC

## 2023-02-16 NOTE — Patient Instructions (Signed)
Plan to increase Vyepti to 300mg   When you get a migraine, take Reyvow 100mg  (one in 24 hours).  Caution no driving for 8 hours after use.  Let me know if it works Follow up 4 months.

## 2023-02-19 ENCOUNTER — Other Ambulatory Visit: Payer: Self-pay

## 2023-02-19 ENCOUNTER — Ambulatory Visit (INDEPENDENT_AMBULATORY_CARE_PROVIDER_SITE_OTHER): Payer: 59

## 2023-02-19 ENCOUNTER — Telehealth: Payer: Self-pay | Admitting: Pharmacy Technician

## 2023-02-19 VITALS — BP 133/83 | HR 72 | Temp 98.1°F | Resp 16 | Ht 65.0 in | Wt 218.2 lb

## 2023-02-19 DIAGNOSIS — G43709 Chronic migraine without aura, not intractable, without status migrainosus: Secondary | ICD-10-CM

## 2023-02-19 MED ORDER — SODIUM CHLORIDE 0.9 % IV SOLN
300.0000 mg | Freq: Once | INTRAVENOUS | Status: AC
Start: 2023-02-19 — End: 2023-02-19
  Administered 2023-02-19: 300 mg via INTRAVENOUS
  Filled 2023-02-19: qty 3

## 2023-02-19 NOTE — Progress Notes (Signed)
Diagnosis: Migraines  Provider:  Chilton Greathouse MD  Procedure: IV Infusion  IV Type: Peripheral, IV Location: L Forearm  Vyepti (Eptinezumab-jjmr), Dose: 300 mg  Infusion Start Time: 1026  Infusion Stop Time: 1100  Post Infusion IV Care: Peripheral IV Discontinued  Discharge: Condition: Good, Destination: Home . AVS Provided  Performed by:  Nat Math, RN

## 2023-02-19 NOTE — Telephone Encounter (Signed)
Dr. Everlena Cooper, [facility] note:  increase in dose.   Auth Submission: APPROVED Site of care: Site of care: [facility] Payer: [employer] Medication & CPT/J Code(s) submitted: Vyepti (Eptinezumab) [id number] Route of submission (phone, fax, portal): PORTAL Phone # Fax # Auth type: Buy/Bill PB Units/visits requested: 300MG  Q3 MONTHS Reference number: [id number] Approval from: 02/19/23 to 02/19/24

## 2023-02-22 ENCOUNTER — Other Ambulatory Visit: Payer: Self-pay | Admitting: Family Medicine

## 2023-02-22 DIAGNOSIS — F3341 Major depressive disorder, recurrent, in partial remission: Secondary | ICD-10-CM

## 2023-02-23 ENCOUNTER — Encounter: Payer: Self-pay | Admitting: Neurology

## 2023-02-23 ENCOUNTER — Other Ambulatory Visit: Payer: Self-pay | Admitting: Neurology

## 2023-02-23 MED ORDER — REYVOW 100 MG PO TABS: 1.0000 | ORAL_TABLET | Freq: Every day | ORAL | 11 refills | Status: DC | PRN

## 2023-03-04 ENCOUNTER — Other Ambulatory Visit: Payer: Self-pay | Admitting: Family Medicine

## 2023-03-23 ENCOUNTER — Encounter (HOSPITAL_COMMUNITY): Payer: Self-pay

## 2023-03-23 ENCOUNTER — Ambulatory Visit (INDEPENDENT_AMBULATORY_CARE_PROVIDER_SITE_OTHER): Payer: 59

## 2023-03-23 ENCOUNTER — Ambulatory Visit (HOSPITAL_COMMUNITY)
Admission: RE | Admit: 2023-03-23 | Discharge: 2023-03-23 | Disposition: A | Discharge status: Home / Self Care | Payer: 59 | Source: Ambulatory Visit | Attending: Internal Medicine | Admitting: Internal Medicine

## 2023-03-23 VITALS — BP 125/82 | HR 93 | Temp 98.2°F | Resp 18

## 2023-03-23 DIAGNOSIS — M25562 Pain in left knee: Secondary | ICD-10-CM | POA: Diagnosis not present

## 2023-03-23 DIAGNOSIS — W19XXXA Unspecified fall, initial encounter: Secondary | ICD-10-CM

## 2023-03-23 MED ORDER — KETOROLAC TROMETHAMINE 30 MG/ML IJ SOLN
30.0000 mg | Freq: Once | INTRAMUSCULAR | Status: AC
  Administered 2023-03-23: 30 mg via INTRAMUSCULAR

## 2023-03-23 MED ORDER — KETOROLAC TROMETHAMINE 30 MG/ML IJ SOLN
INTRAMUSCULAR | Status: AC
  Filled 2023-03-23: qty 1

## 2023-03-23 NOTE — ED Triage Notes (Addendum)
Pt presents with left knee injury x 2 days ago "after falling onto the gravel trying to get out of car. I just slipped and fell." Pt currently rates her left knee pain 10/10 "when moving it." Pt took one dose of Tylenol & applied ice with no improvement. Pt denies head injury. Pt also reporting "soreness in her right shoulder" following fall. Pt is ambulatory to treatment room.

## 2023-03-23 NOTE — Discharge Instructions (Signed)
X-ray is normal.  Knee brace applied.  Apply ice.  Toradol shot administered today for pain.  Do not take any ibuprofen, Advil, Aleve for at least 24 hours following injection.  Follow-up with orthopedist.

## 2023-03-23 NOTE — ED Provider Notes (Addendum)
[facility]    CSN: [id number] Arrival date & time: 03/23/23  1052      History   Chief Complaint Chief Complaint  Patient presents with   Knee Injury    I fell and my knee pain is a 10 - Entered by patient    HPI Alyssa Clay is a 57 y.o. female.   Patient presents with right shoulder pain and left knee pain after fall that occurred 2 days ago.  Patient reports that she was attempting to get out of a car when she lost her balance due to sloped ground.  Denies hitting head or losing consciousness.  She did not land on her knee but reports that she thinks that she twisted it.  Reports that she hit her right shoulder/upper back on the car.  She does not take any blood thinning medications.  She has taken Tylenol for pain.  She has a small abrasion to left knee from fall. Patient reports tetanus vaccine was up-to-date within the past 5 years.     Past Medical History:  Diagnosis Date   Allergy    Anemia    Asthma    Constipation    issues x the past month -? 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   Enlarged ovary 09/15/2020   noted on ct scan, per pt   FUO (fever of unknown origin) 10/21/2019   GERD (gastroesophageal reflux disease)    Hypertension    PAST hx    Migraine     Patient Active Problem List   Diagnosis Date Noted   Vertigo 09/05/2022   Acute viral pharyngitis 06/06/2022   Acute left otitis media 02/21/2022   Recurrent infections 11/14/2021   Vaginal yeast infection 11/14/2021   Rash and other nonspecific skin eruption 11/14/2021   Class 2 obesity with body mass index (BMI) of 38.0 to 38.9 in adult 10/19/2021   History of COVID-19 09/12/2021   Chronic migraine without aura without status migrainosus, not intractable 05/26/2021   Lumbar radiculopathy, right 10/08/2020   SI (sacroiliac) joint dysfunction 09/16/2020   Somatic dysfunction of spine, sacral 09/16/2020    OSA (obstructive sleep apnea) 12/18/2019   FUO (fever of unknown origin) 10/21/2019   S/P bilateral breast reduction 06/19/2019   Back pain 04/15/2019   Neck pain 04/15/2019   History of frequent upper respiratory infection 01/16/2019   Atelectasis 01/16/2019   Severe persistent asthma without complication 01/16/2019   Shortness of breath 01/01/2019   Primary osteoarthritis of both knees 07/10/2018   Hypertension 03/27/2018   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/2018   Chronic rhinitis 01/07/2018   Gastroesophageal reflux disease 01/07/2018   Borderline hyperlipidemia 06/23/2013   Depression 06/23/2013   FH: CAD (coronary artery disease) 06/23/2013   Migraine 06/23/2013   Chest pain 06/23/2013    Past Surgical History:  Procedure Laterality Date   APPENDECTOMY     BREAST REDUCTION SURGERY Bilateral 06/12/2019   Procedure: BREAST REDUCTION WITH LIPOSUCTION;  Surgeon: Peggye Form, DO;  Location: [facility];  Service: Plastics;  Laterality: Bilateral;  4 hours, please   CESAREAN SECTION  1994   DILATION AND CURETTAGE OF UTERUS     multiple due to miscarriages   KNEE ARTHROSCOPY Left 2013   LAPAROSCOPIC APPENDECTOMY N/A 12/08/2018   Procedure: APPENDECTOMY LAPAROSCOPIC;  Surgeon: Manus Rudd, MD;  Location: [facility]  OR;  Service: General;  Laterality: N/A;   REDUCTION MAMMAPLASTY Bilateral 2021   TENNIS ELBOW RELEASE/NIRSCHEL PROCEDURE  2009   WISDOM TOOTH EXTRACTION      OB History     Gravida  5   Para  1   Term  1   Preterm      AB  4   Living  2      SAB  4   IAB      Ectopic      Multiple      Live Births  1            Home Medications    Prior to Admission medications   Medication Sig Start Date End Date Taking? Authorizing Provider  albuterol (PROVENTIL) (2.5 MG/3ML) 0.083% nebulizer solution Take 3 mLs (2.5 mg total) by nebulization every 4 (four) hours as needed for wheezing or shortness of  breath (coughing fits). 12/18/22   Ellamae Sia, DO  albuterol (VENTOLIN HFA) 108 (90 Base) MCG/ACT inhaler Inhale 2 puffs into the lungs every 4 (four) hours as needed for wheezing or shortness of breath (coughing fits). 12/18/22   Ellamae Sia, DO  budesonide (PULMICORT) 0.5 MG/2ML nebulizer solution Take 2 mLs (0.5 mg total) by nebulization in the morning and at bedtime. Take for 1-2 weeks during asthma flare. 12/18/22   Ellamae Sia, DO  buPROPion (WELLBUTRIN XL) 150 MG 24 hr tablet TAKE 1 TABLET BY MOUTH DAILY 02/23/23   Karie Georges, MD  cyclobenzaprine (FLEXERIL) 10 MG tablet Take 10 mg by mouth 3 (three) times daily as needed (migraines).    [provider]  DULoxetine (CYMBALTA) 60 MG capsule Take 1 capsule (60 mg total) by mouth daily. 09/04/22   Karie Georges, MD  Estradiol (YUVAFEM) 10 MCG TABS vaginal tablet Place 1 tablet (10 mcg total) vaginally daily. 08/30/20   Koberlein, Paris Lore, MD  fluticasone furoate-vilanterol (BREO ELLIPTA) 200-25 MCG/ACT AEPB Inhale 1 puff into the lungs daily. Rinse mouth after each use. 12/19/22   Ellamae Sia, DO  gabapentin (NEURONTIN) 300 MG capsule Take 600 mg by mouth at bedtime. Taking 2 tablets daily 01/03/18   [provider]  gentamicin ointment (GARAMYCIN) 0.1 % Apply 1 Application topically 3 (three) times daily. 11/07/22   [provider]  Lasmiditan Succinate (REYVOW) 100 MG TABS Take 1 tablet (100 mg total) by mouth daily as needed. 02/23/23   Drema Dallas, DO  Multiple Vitamin (MULTIVITAMIN WITH MINERALS) TABS tablet Take 1 tablet by mouth daily.    [provider]  Respiratory Therapy Supplies (FLUTTER) DEVI Use 3 times daily as directed 01/22/19   Mannam, Praveen, MD  rizatriptan (MAXALT) 10 MG tablet Take 1 tablet (10 mg total) by mouth as needed for migraine. May repeat in 2 hours if needed.  Maximum 2 tablets in 24 hours. 08/16/22   Drema Dallas, DO  Semaglutide-Weight Management (WEGOVY) 2.4 MG/0.75ML SOAJ  Inject 2.4 mg into the skin once a week. 10/04/22   Karie Georges, MD  Spacer/Aero-Holding Chambers (AEROCHAMBER MV) inhaler Use as instructed 04/28/22   Domenick Gong, MD  Tezepelumab-ekko (TEZSPIRE) 210 MG/1. SOAJ Inject 210 mg into the skin every 28 (twenty-eight) days. 01/03/23   Ellamae Sia, DO  traZODone (DESYREL) 100 MG tablet TAKE 1 TO 2 TABLETS BY MOUTH AT  BEDTIME AS NEEDED FOR SLEEP 03/05/23   Karie Georges, MD  tretinoin (RETIN-A) 0.05 % cream Apply topically. 03/30/20  [provider]  valACYclovir (VALTREX) 1000 MG tablet Take 1 tablet (1,000 mg total) by mouth 2 (two) times daily. 06/22/22   Karie Georges, MD    Family History Family History  Problem Relation Age of Onset   Depression Mother    Lymphoma Mother 85   Lung cancer Mother    Hypertension Father    Heart attack Father 49   Hypertension Brother    High blood pressure Brother    High Cholesterol Brother    Healthy Daughter    Colon cancer Maternal Aunt 40   Esophageal cancer Neg Hx    Pancreatic cancer Neg Hx    Stomach cancer Neg Hx    Colon polyps Neg Hx    Rectal cancer Neg Hx     Social History Social History   Tobacco Use   Smoking status: Never   Smokeless tobacco: Never  Vaping Use   Vaping status: Never Used  Substance Use Topics   Alcohol use: Yes    Comment: occ 2-3 beers a month    Drug use: Never     Allergies   Penicillins and Zonisamide   Review of Systems Review of Systems Per HPI  Physical Exam Triage Vital Signs ED Triage Vitals  Encounter Vitals Group     BP 03/23/23 1116 125/82     Systolic BP Percentile --      Diastolic BP Percentile --      Pulse Rate 03/23/23 1116 93     Resp 03/23/23 1116 18     Temp 03/23/23 1116 98.2 F (36.8 C)     Temp Source 03/23/23 1116 Oral     SpO2 03/23/23 1116 97 %     Weight --      Height --      Head Circumference --      Peak Flow --      Pain Score 03/23/23 1114 10     Pain Loc --      Pain  Education --      Exclude from Growth Chart --    No data found.  Updated Vital Signs BP 125/82 (BP Location: Left Arm)   Pulse 93   Temp 98.2 F (36.8 C) (Oral)   Resp 18   LMP 12/20/2018   SpO2 97%   Visual Acuity Right Eye Distance:   Left Eye Distance:   Bilateral Distance:    Right Eye Near:   Left Eye Near:    Bilateral Near:     Physical Exam Constitutional:      General: She is not in acute distress.    Appearance: Normal appearance. She is not toxic-appearing or diaphoretic.  HENT:     Head: Normocephalic and atraumatic.  Eyes:     Extraocular Movements: Extraocular movements intact.     Conjunctiva/sclera: Conjunctivae normal.  Pulmonary:     Effort: Pulmonary effort is normal.  Musculoskeletal:       Back:     Left knee: Swelling present. Tenderness present. No LCL laxity, MCL laxity, ACL laxity or PCL laxity.Normal pulse.     Comments: Mild tenderness to palpation to right upper back/posterior ribs.  No discoloration, lacerations, abrasions, swelling noted.  No tenderness to right upper extremity.  Patient has tenderness to palpation to lower anterior left knee.  Very mild superficial abrasion noted.  No bleeding noted.  Patient having difficulty with range of motion and bearing weight.  Appears to be neurovascularly intact.  Neurological:  General: No focal deficit present.     Mental Status: She is alert and oriented to person, place, and time. Mental status is at baseline.  Psychiatric:        Mood and Affect: Mood normal.        Behavior: Behavior normal.        Thought Content: Thought content normal.        Judgment: Judgment normal.      UC Treatments / Results  Labs (all labs ordered are listed, but only abnormal results are displayed) Labs Reviewed - No data to display  EKG   Radiology DG Knee Complete 4 Views Left  Result Date: 03/23/2023 CLINICAL DATA:  Larey Seat 2 days ago.  Left knee pain. EXAM: LEFT KNEE - COMPLETE 4+ VIEW  COMPARISON:  None Available. FINDINGS: No fracture or bone lesion. Mediolateral femoral tibial joint space compartments are well preserved. Moderate narrowing of the patellofemoral joint space compartment, predominantly along its lateral margin, with associated subchondral sclerosis, small subchondral cysts and marginal osteophytes. No joint effusion. Surrounding soft tissues are unremarkable IMPRESSION: 1. No fracture or acute finding. 2. Moderate patellofemoral joint space compartment osteoarthritis. Electronically Signed   By: Amie Portland M.D.   On: 03/23/2023 11:58    Procedures Procedures (including critical care time)  Medications Ordered in UC Medications  ketorolac (TORADOL) 30 MG/ML injection 30 mg (30 mg Intramuscular Given 03/23/23 1233)    Initial Impression / Assessment and Plan / UC Course  I have reviewed the triage vital signs and the nursing notes.  Pertinent labs & imaging results that were available during my care of the patient were reviewed by me and considered in my medical decision making (see chart for details).     Recommended x-ray of the right ribs but patient declined this.  X-ray of the left knee was negative for any acute bony abnormality but does show osteoarthritis as incidental finding which is previously noted on past imaging.  Suspect knee sprain.  Patient placed in knee brace.  Low concern for ligament tear but given patient is having difficulty bearing weight recommended that she see orthopedist at provided contact for further evaluation and management in case she needs more advanced imaging than can be provided here in urgent care.  Offered crutches but patient declined.  Advised ice application and supportive care.  IM Toradol administered for pain.  Advised no NSAIDs for at least 24 hours following injection and safe over-the-counter pain relievers.  Abrasion of knee is very minimal.  No signs of infection.  Patient reports that tetanus is up-to-date.   Patient to monitor for signs of infection and follow-up if they occur.  Patient verbalized understanding and was agreeable with plan. Final Clinical Impressions(s) / UC Diagnoses   Final diagnoses:  Acute pain of left knee  Fall, initial encounter     Discharge Instructions      X-ray is normal.  Knee brace applied.  Apply ice.  Toradol shot administered today for pain.  Do not take any ibuprofen, Advil, Aleve for at least 24 hours following injection.  Follow-up with orthopedist.     ED Prescriptions   None    PDMP not reviewed this encounter.   Gustavus Bryant, [place] 03/23/23 1235    [address], [place] 03/23/23 1236    Gustavus Bryant, [place] 03/23/23 [phone]

## 2023-03-28 ENCOUNTER — Telehealth: Payer: Self-pay | Admitting: Family Medicine

## 2023-03-28 NOTE — Telephone Encounter (Signed)
Pt had a fall right before Thanksgiving, and ended up going to the ED.  Pt states she was given the number to [facility] on [address], but does not believe she received a referral.  Pt says she has been calling [facility] for days, and cannot seem to get anyone on the line.  Pt is wondering if MD can create a referral for her, in hopes this will expedite at least a phone call from them? If not, Pt is asking if MD can give her a referral to another location, that accepts her insurance and can see her soon? Please advise.

## 2023-03-28 NOTE — Telephone Encounter (Signed)
Spoke with the patient and informed her a visit is needed for evaluation prior to placing a referral.  ER follow up appt was scheduled on 12/5 to arrive at 12:45pm for check in.

## 2023-03-29 ENCOUNTER — Ambulatory Visit (INDEPENDENT_AMBULATORY_CARE_PROVIDER_SITE_OTHER): Payer: 59 | Admitting: Family Medicine

## 2023-03-29 ENCOUNTER — Encounter: Payer: Self-pay | Admitting: Family Medicine

## 2023-03-29 VITALS — BP 114/68 | HR 88 | Temp 99.1°F | Ht 65.0 in | Wt 213.0 lb

## 2023-03-29 DIAGNOSIS — R52 Pain, unspecified: Secondary | ICD-10-CM | POA: Diagnosis not present

## 2023-03-29 DIAGNOSIS — M25562 Pain in left knee: Secondary | ICD-10-CM

## 2023-03-29 LAB — POCT INFLUENZA A/B
Influenza A, POC: NEGATIVE
Influenza B, POC: NEGATIVE

## 2023-03-29 LAB — POC COVID19 BINAXNOW: SARS Coronavirus 2 Ag: NEGATIVE

## 2023-03-29 NOTE — Patient Instructions (Addendum)
Ibuprofen 800 every 8 hours for knee pain

## 2023-03-29 NOTE — Progress Notes (Signed)
Established Patient Office Visit  Subjective   Patient ID: Alyssa Clay, female    DOB: 05-17-1965  Age: 57 y.o. MRN: [id number]  Chief Complaint  Patient presents with   Follow-up    Patient presents for ER follow due to left knee pain, requests referral to orthopedic    Pt is here to follow up with her left knee pain. She wen to UC and x-rays showed mild arthritis but otherwise no fractures or other issues. She reports that she has been keeping it elevated and she has only had a mild improvement in the pain level.   Pt is also reporting some nasal congestion, woke up with a slight upset stomach. States that she has had some mild coughing, body aches, etc.    Current Outpatient Medications  Medication Instructions   albuterol (PROVENTIL) 2.5 mg, Nebulization, Every 4 hours PRN   albuterol (VENTOLIN HFA) 108 (90 Base) MCG/ACT inhaler 2 puffs, Inhalation, Every 4 hours PRN   budesonide (PULMICORT) 0.5 mg, Nebulization, 2 times daily, Take for 1-2 weeks during asthma flare.   buPROPion (WELLBUTRIN XL) 150 mg, Oral, Daily   cyclobenzaprine (FLEXERIL) 10 mg, Oral, 3 times daily PRN   DULoxetine (CYMBALTA) 60 mg, Oral, Daily   Estradiol (YUVAFEM) 10 mcg, Vaginal, Daily   fluticasone furoate-vilanterol (BREO ELLIPTA) 200-25 MCG/ACT AEPB 1 puff, Inhalation, Daily, Rinse mouth after each use.   gabapentin (NEURONTIN) 600 mg, Oral, Daily at bedtime, Taking 2 tablets daily   gentamicin ointment (GARAMYCIN) 0.1 % 1 Application, Topical, 3 times daily   Multiple Vitamin (MULTIVITAMIN WITH MINERALS) TABS tablet 1 tablet, Oral, Daily   Respiratory Therapy Supplies (FLUTTER) DEVI Use 3 times daily as directed   Reyvow 100 mg, Oral, Daily PRN   rizatriptan (MAXALT) 10 mg, Oral, As needed, May repeat in 2 hours if needed.  Maximum 2 tablets in 24 hours.   Spacer/Aero-Holding Chambers (AEROCHAMBER MV) inhaler Use as instructed   Tezspire 210 mg, Subcutaneous, Every 28 days   traZODone  (DESYREL) 100-200 mg, Oral, At bedtime PRN, for sleep   tretinoin (RETIN-A) 0.05 % cream Topical   valACYclovir (VALTREX) 1,000 mg, Oral, 2 times daily   Wegovy 2.4 mg, Subcutaneous, Weekly    Patient Active Problem List   Diagnosis Date Noted   Vertigo 09/05/2022   Acute viral pharyngitis 06/06/2022   Acute left otitis media 02/21/2022   Recurrent infections 11/14/2021   Vaginal yeast infection 11/14/2021   Rash and other nonspecific skin eruption 11/14/2021   Class 2 obesity with body mass index (BMI) of 38.0 to 38.9 in adult 10/19/2021   History of COVID-19 09/12/2021   Chronic migraine without aura without status migrainosus, not intractable 05/26/2021   Lumbar radiculopathy, right 10/08/2020   SI (sacroiliac) joint dysfunction 09/16/2020   Somatic dysfunction of spine, sacral 09/16/2020   OSA (obstructive sleep apnea) 12/18/2019   FUO (fever of unknown origin) 10/21/2019   S/P bilateral breast reduction 06/19/2019   Back pain 04/15/2019   Neck pain 04/15/2019   History of frequent upper respiratory infection 01/16/2019   Atelectasis 01/16/2019   Severe persistent asthma without complication 01/16/2019   Shortness of breath 01/01/2019   Primary osteoarthritis of both knees 07/10/2018   Hypertension 03/27/2018   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     Objective:     BP 114/68 (BP Location: Left Arm, Patient Position: Sitting, Cuff Size: Large)   Pulse 88   Temp 99.1 F (37.3 C) (Oral)   Ht 5\' 5"  (1.651 m)   Wt 213 lb (96.6 kg)   LMP 12/20/2018   SpO2 96%   BMI 35.45 kg/m    Physical Exam Vitals reviewed.  Constitutional:       Appearance: Normal appearance. She is obese.  HENT:     Right Ear: Tympanic membrane and ear canal normal.     Left Ear: Tympanic membrane and ear canal normal.     Mouth/Throat:     Mouth: Mucous membranes are moist.     Pharynx: No posterior oropharyngeal erythema.  Cardiovascular:     Rate and Rhythm: Normal rate and regular rhythm.     Heart sounds: No murmur heard. Pulmonary:     Effort: Pulmonary effort is normal.     Breath sounds: Normal breath sounds. No wheezing or rales.  Lymphadenopathy:     Cervical: No cervical adenopathy.  Neurological:     Mental Status: She is alert and oriented to person, place, and time.      Results for orders placed or performed in visit on 03/29/23  POC Influenza A/B  Result Value Ref Range   Influenza A, POC Negative Negative   Influenza B, POC Negative Negative  POC COVID-19  Result Value Ref Range   SARS Coronavirus 2 Ag Negative Negative      The 10-year ASCVD risk score (Arnett DK, et al., 2019) is: 1.1%    Assessment & Plan:  Generalized body aches COVID and flu negative, most likley a viral syndrome, recommended OTC medications for symptom control, no signs of bacterial infection on exam.  -     POCT Influenza A/B -     POC COVID-19 BinaxNow  Acute pain of left knee Persistent despite use of tylenol at home, will send to Orthopedic surgery for evaluation  -     Ambulatory referral to Orthopedic Surgery       No follow-ups on file.    Karie Georges, MD

## 2023-04-05 ENCOUNTER — Ambulatory Visit (INDEPENDENT_AMBULATORY_CARE_PROVIDER_SITE_OTHER): Payer: 59 | Admitting: Family Medicine

## 2023-04-05 ENCOUNTER — Encounter: Payer: Self-pay | Admitting: Family Medicine

## 2023-04-05 DIAGNOSIS — Z6838 Body mass index (BMI) 38.0-38.9, adult: Secondary | ICD-10-CM | POA: Diagnosis not present

## 2023-04-05 DIAGNOSIS — E66812 Obesity, class 2: Secondary | ICD-10-CM | POA: Diagnosis not present

## 2023-04-05 MED ORDER — WEGOVY 2.4 MG/0.75ML ~~LOC~~ SOAJ: 2.4000 mg | SUBCUTANEOUS | 3 refills | Status: DC

## 2023-04-05 NOTE — Progress Notes (Signed)
Established Patient Office Visit  Subjective   Patient ID: Alyssa Clay, female    DOB: 1965-10-11  Age: 57 y.o. MRN: [id number]  Chief Complaint  Patient presents with   Medical Management of Chronic Issues    Pt is here for follow up on her weight loss therapy. Patient reports that she has had lot going on this year-- mom passed away and now it is the holidays. Had more consistent migraines in the past but these are improving. States that she is going to be exercising more now that her migraines are better.  We went through her numbers/ goals again today, I wrote them down for her. She is reporting increase in hunger on the days she does cardio.     Current Outpatient Medications  Medication Instructions   albuterol (PROVENTIL) 2.5 mg, Nebulization, Every 4 hours PRN   albuterol (VENTOLIN HFA) 108 (90 Base) MCG/ACT inhaler 2 puffs, Inhalation, Every 4 hours PRN   budesonide (PULMICORT) 0.5 mg, Nebulization, 2 times daily, Take for 1-2 weeks during asthma flare.   buPROPion (WELLBUTRIN XL) 150 mg, Oral, Daily   cyclobenzaprine (FLEXERIL) 10 mg, 3 times daily PRN   DULoxetine (CYMBALTA) 60 mg, Oral, Daily   Estradiol (YUVAFEM) 10 mcg, Vaginal, Daily   fluticasone furoate-vilanterol (BREO ELLIPTA) 200-25 MCG/ACT AEPB 1 puff, Inhalation, Daily, Rinse mouth after each use.   gabapentin (NEURONTIN) 600 mg, Daily at bedtime   Multiple Vitamin (MULTIVITAMIN WITH MINERALS) TABS tablet 1 tablet, Daily   Respiratory Therapy Supplies (FLUTTER) DEVI Use 3 times daily as directed   Reyvow 100 mg, Oral, Daily PRN   rizatriptan (MAXALT) 10 mg, Oral, As needed, May repeat in 2 hours if needed.  Maximum 2 tablets in 24 hours.   Spacer/Aero-Holding Chambers (AEROCHAMBER MV) inhaler Use as instructed   Tezspire 210 mg, Subcutaneous, Every 28 days   traZODone (DESYREL) 100-200 mg, Oral, At bedtime PRN, for sleep   tretinoin (RETIN-A) 0.05 % cream Apply topically.   valACYclovir (VALTREX) 1,000  mg, Oral, 2 times daily   Wegovy 2.4 mg, Subcutaneous, Weekly    Patient Active Problem List   Diagnosis Date Noted   Vertigo 09/05/2022   Acute viral pharyngitis 06/06/2022   Acute left otitis media 02/21/2022   Recurrent infections 11/14/2021   Vaginal yeast infection 11/14/2021   Rash and other nonspecific skin eruption 11/14/2021   Class 2 obesity with body mass index (BMI) of 38.0 to 38.9 in adult 10/19/2021   History of COVID-19 09/12/2021   Chronic migraine without aura without status migrainosus, not intractable 05/26/2021   Lumbar radiculopathy, right 10/08/2020   SI (sacroiliac) joint dysfunction 09/16/2020   Somatic dysfunction of spine, sacral 09/16/2020   OSA (obstructive sleep apnea) 12/18/2019   FUO (fever of unknown origin) 10/21/2019   S/P bilateral breast reduction 06/19/2019   Back pain 04/15/2019   Neck pain 04/15/2019   History of frequent upper respiratory infection 01/16/2019   Atelectasis 01/16/2019   Severe persistent asthma without complication 01/16/2019   Shortness of breath 01/01/2019   Primary osteoarthritis of both knees 07/10/2018   Hypertension 03/27/2018   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  Objective:     BP 132/80   Pulse 95   Temp 98.7 F (37.1 C) (Oral)   Ht 5\' 5"  (1.651 m)   Wt 215 lb 6.4 oz (97.7 kg)   LMP 12/20/2018   SpO2 98%   BMI 35.84 kg/m     Physical Exam Vitals reviewed.  Constitutional:      Appearance: Normal appearance. She is obese.  Eyes:     Conjunctiva/sclera: Conjunctivae normal.  Pulmonary:     Effort: Pulmonary effort is normal.  Neurological:     Mental Status:  She is alert and oriented to person, place, and time. Mental status is at baseline.  Psychiatric:        Mood and Affect: Mood normal.        Behavior: Behavior normal.      No results found for any visits on 04/05/23.     The 10-year ASCVD risk score (Arnett DK, et al., 2019) is: 1.5%    Assessment & Plan:  Class 2 severe obesity with serious comorbidity and body mass index (BMI) of 38.0 to 38.9 in adult, unspecified obesity type [facility]) Assessment & Plan: Refilling her wegovy 2.4 mg weekly, overall her weight has remained stable, not much weight loss with the medication. Pt is sometimes nonadherent to her diet regimen. I gave her new numbers today for her goal on calories and carbs. I advised that if she is unable to lose weight on this medication then we would consider switching her to zepbound in march. Pt is agreeable to this plan. Follow up in 3 months.   Orders: -     Wegovy; Inject 2.4 mg into the skin once a week.  Dispense: 6 mL; Refill: 3     Return in about 3 months (around 07/04/2023) for annual physical exam.    Karie Georges, MD

## 2023-04-05 NOTE — Patient Instructions (Signed)
Basal energy expenditure:  1600 calories per day  Keep carbs under 100 grams per day  Keep protein above 100 grams per day

## 2023-04-06 NOTE — Assessment & Plan Note (Signed)
Refilling her wegovy 2.4 mg weekly, overall her weight has remained stable, not much weight loss with the medication. Pt is sometimes nonadherent to her diet regimen. I gave her new numbers today for her goal on calories and carbs. I advised that if she is unable to lose weight on this medication then we would consider switching her to zepbound in march. Pt is agreeable to this plan. Follow up in 3 months.

## 2023-04-12 ENCOUNTER — Other Ambulatory Visit (HOSPITAL_COMMUNITY): Payer: Self-pay

## 2023-04-12 ENCOUNTER — Telehealth: Payer: Self-pay

## 2023-04-12 NOTE — Telephone Encounter (Signed)
Pharmacy Patient Advocate Encounter   Received notification from CoverMyMeds that prior authorization for [facility] 2.4MG /0.75ML auto-injectors is required/requested.   Insurance verification completed.   The patient is insured through [facility] .   Per test claim: PA required; PA submitted to above mentioned insurance via CoverMyMeds Key/confirmation #/EOC [id number] Status is pending

## 2023-04-13 ENCOUNTER — Other Ambulatory Visit (HOSPITAL_COMMUNITY): Payer: Self-pay

## 2023-04-13 NOTE — Telephone Encounter (Signed)
Spoke with Buddy Duty, pharmacist at [facility] and informed her of the message below.  She stated the patient will be contacted when the Rx is ready to be mailed to her home.

## 2023-04-13 NOTE — Telephone Encounter (Signed)
Pharmacy Patient Advocate Encounter  Received notification from [facility] that Prior Authorization for Wegovy 2.4MG /0.75ML auto-injectors  has been APPROVED from 04/12/23 to 10/11/23. Ran test claim, Copay is $0.00. This test claim was processed through [facility]- copay amounts may vary at other pharmacies due to pharmacy/plan contracts, or as the patient moves through the different stages of their insurance plan.   PA #/Case ID/Reference #: [id number]

## 2023-04-30 ENCOUNTER — Ambulatory Visit: Payer: 59 | Admitting: Allergy

## 2023-05-03 ENCOUNTER — Encounter: Payer: Self-pay | Admitting: Neurology

## 2023-05-09 ENCOUNTER — Encounter: Payer: Self-pay | Admitting: Neurology

## 2023-05-16 ENCOUNTER — Other Ambulatory Visit: Payer: Self-pay | Admitting: Allergy

## 2023-05-17 ENCOUNTER — Encounter: Payer: Self-pay | Admitting: Neurology

## 2023-05-17 ENCOUNTER — Telehealth: Payer: Self-pay

## 2023-05-17 MED ORDER — PREDNISONE 10 MG (21) PO TBPK: ORAL_TABLET | ORAL | 0 refills | Status: DC

## 2023-05-17 NOTE — Telephone Encounter (Signed)
Per [facility] message, I am sorry you are not feeling well.  You may still try another Reyvow today.  Would you like a prednisone taper to try and break this flurry of migraines? AJ   [facility]  Taper sent in.

## 2023-05-17 NOTE — Telephone Encounter (Signed)
Per patient reyvow refills need to last until her May

## 2023-05-18 ENCOUNTER — Other Ambulatory Visit: Payer: Self-pay | Admitting: Neurology

## 2023-05-18 MED ORDER — REYVOW 100 MG PO TABS: 1.0000 | ORAL_TABLET | Freq: Every day | ORAL | 5 refills | Status: DC | PRN

## 2023-05-22 ENCOUNTER — Ambulatory Visit: Payer: 59

## 2023-05-22 ENCOUNTER — Ambulatory Visit (INDEPENDENT_AMBULATORY_CARE_PROVIDER_SITE_OTHER): Payer: 59 | Admitting: Family Medicine

## 2023-05-22 ENCOUNTER — Encounter: Payer: Self-pay | Admitting: Family Medicine

## 2023-05-22 VITALS — BP 102/70 | HR 105 | Temp 98.7°F | Ht 65.0 in | Wt 217.3 lb

## 2023-05-22 DIAGNOSIS — B3731 Acute candidiasis of vulva and vagina: Secondary | ICD-10-CM

## 2023-05-22 DIAGNOSIS — G43E11 Chronic migraine with aura, intractable, with status migrainosus: Secondary | ICD-10-CM

## 2023-05-22 DIAGNOSIS — H66003 Acute suppurative otitis media without spontaneous rupture of ear drum, bilateral: Secondary | ICD-10-CM | POA: Diagnosis not present

## 2023-05-22 MED ORDER — CEFDINIR 300 MG PO CAPS: 300.0000 mg | ORAL_CAPSULE | Freq: Two times a day (BID) | ORAL | 0 refills | Status: AC

## 2023-05-22 MED ORDER — KETOROLAC TROMETHAMINE 60 MG/2ML IM SOLN
60.0000 mg | Freq: Once | INTRAMUSCULAR | Status: AC
  Administered 2023-05-22: 60 mg via INTRAMUSCULAR

## 2023-05-22 MED ORDER — FLUCONAZOLE 150 MG PO TABS: 150.0000 mg | ORAL_TABLET | Freq: Once | ORAL | 0 refills | Status: AC

## 2023-05-22 NOTE — Progress Notes (Unsigned)
   Acute Office Visit  Subjective:     Patient ID: Alyssa Clay, female    DOB: 1966-03-19, 58 y.o.   MRN: [id number]  Chief Complaint  Patient presents with   Ear Pain   Sinus Problem    Patient complains of recurrent facial pain, headaches and congestion    Sinus Problem   Patient is in today for recurrent head pain and also having ear pain on the right, states that she was seen on 04/26/23 and diagnosed with bronchitis, was given doxycyline. Then she also had a persistent migraine for about the last 12 days, states that she messaged Dr. Everlena Cooper and he refilled her Reyvow, however it is not working, still having severe head pain. No fever or chills,   Review of Systems  All other systems reviewed and are negative.       Objective:    BP 102/70   Pulse (!) 105   Temp 98.7 F (37.1 C) (Oral)   Ht 5\' 5"  (1.651 m)   Wt 217 lb 4.8 oz (98.6 kg)   LMP 12/20/2018   SpO2 99%   BMI 36.16 kg/m  {Vitals History (Optional):23777}  Physical Exam Vitals reviewed.     No results found for any visits on 05/22/23.      Assessment & Plan:   Problem List Items Addressed This Visit       Unprioritized   Migraine   Relevant Medications   ketorolac (TORADOL) injection 60 mg (Start on 05/22/2023  5:00 PM)   Vaginal yeast infection   Relevant Medications   cefdinir (OMNICEF) 300 MG capsule   fluconazole (DIFLUCAN) 150 MG tablet   Other Visit Diagnoses       Non-recurrent acute suppurative otitis media of both ears without spontaneous rupture of tympanic membranes    -  Primary   Relevant Medications   cefdinir (OMNICEF) 300 MG capsule   fluconazole (DIFLUCAN) 150 MG tablet       Meds ordered this encounter  Medications   cefdinir (OMNICEF) 300 MG capsule    Sig: Take 1 capsule (300 mg total) by mouth 2 (two) times daily for 10 days.    Dispense:  20 capsule    Refill:  0   fluconazole (DIFLUCAN) 150 MG tablet    Sig: Take 1 tablet (150 mg total) by mouth once for  1 dose.    Dispense:  1 tablet    Refill:  0   ketorolac (TORADOL) injection 60 mg    No follow-ups on file.  Karie Georges, MD

## 2023-05-23 ENCOUNTER — Ambulatory Visit: Payer: 59 | Admitting: Allergy

## 2023-05-24 ENCOUNTER — Encounter: Payer: Self-pay | Admitting: Neurology

## 2023-05-24 ENCOUNTER — Telehealth: Payer: Self-pay

## 2023-05-24 NOTE — Telephone Encounter (Signed)
PA needed for Reyvow

## 2023-05-30 ENCOUNTER — Ambulatory Visit: Payer: 59

## 2023-06-03 NOTE — Progress Notes (Signed)
 Follow Up Note  RE: Alyssa Clay MRN: [id number] DOB: [birth year] Date of Office Visit: 06/04/2023  Referring provider: Aida House, MD Primary care provider: Aida House, MD  Chief Complaint: Asthma (Been sick since January with flu, and had norovirus last week. )  History of Present Illness: I had the pleasure of seeing Alyssa Clay for a follow up visit at the [facility] on 06/04/2023. She is a 58 y.o. female, who is being followed for asthma, atelectasis, chronic rhinitis, GERD and recurrent infections. Her previous allergy office visit was on 12/18/2022 with Dr. Burdette Carolin. Today is a regular follow up visit.  Discussed the use of AI scribe software for clinical note transcription with the patient, who gave verbal consent to proceed.  Since the holidays, she has experienced a series of illnesses, starting with the flu lasting approximately two and a half weeks, which progressed to a severe bilateral ear infection with fluid discharge. She was treated with antibiotics for the ear infection. Recently, she and her son contracted norovirus, resulting in significant weight loss after a prior weight gain due to steroid treatment. She has resumed gym activities as of today. No current fever or chills, but she has a persistent cough.   Regarding her asthma, she uses Breo as needed, particularly during her flu illness, but has not used it consistently in the past few weeks. She is currently on Tezspire  injections every four weeks at home, though she is uncertain about its efficacy. She used a nebulizer for four days during her flu illness, which she found helpful. She did not receive Tamiflu during her flu episode but was treated with steroids, Tylenol , Motrin , and advised to rest and hydrate. She received a flu shot in the fall. Upon EMR review her flu test in December was negative.  She experiences constipation, which she manages with Miralax , prune juice, and  increased physical activity. She also mentions a history of bronchitis during her flu illness, which was treated with steroids.  Her heartburn, exacerbated during her illness, is now under control. She previously used an over-the-counter medication for it for 14 days.     Assessment and Plan: Alyssa Clay is a 58 y.o. female with: Severe persistent asthma without complication Past history - Spiriva  caused coughing. Normal alpha-1 level. Seen by pulmonary.Started Tezspire  on 03/31/21. Interim history - Reports good control prior to recent illnesses. Has not been using Breo regularly due to recent illnesses. Today's spirometry showed some restriction.  Daily controller medications: take Breo 200mcg 1 puff once a day and rinse mouth after each use. Do the flutter valve and incentive spirometer. Continue Tezspire  injection every 4 weeks at home.  During respiratory infections/asthma flares:  Start Budesonide  0.5mg  nebulizer twice a day for 1-2 weeks until your breathing symptoms return to baseline.  Pretreat with albuterol  2 puffs or albuterol  nebulizer.  If you need to use your albuterol  nebulizer machine back to back within 15-30 minutes with no relief then please go to the ER/urgent care for further evaluation.  May use albuterol  rescue inhaler 2 puffs or nebulizer every 4 to 6 hours as needed for shortness of breath, chest tightness, coughing, and wheezing. May use albuterol  rescue inhaler 2 puffs 5 to 15 minutes prior to strenuous physical activities. Monitor frequency of use - if you need to use it more than twice per week on a consistent basis let us  know.    Atelectasis Past history - 9/9/202 CT chest showed: 1. Prominent  elevation of the right hemidiaphragm with associated right lung base atelectasis including complete right middle lobe atelectasis. Right hemidiaphragm paralysis not excluded. Saw pulm.  Encouraged using flutter valve and incentive spirometer.    Chronic rhinitis Past history -  2019 skin testing negative. Interim history - stable. Use Flonase  (fluticasone ) nasal spray 1-2 sprays per nostril once a day as needed for nasal congestion.  Nasal saline spray (i.e., Simply Saline) or nasal saline lavage (i.e., NeilMed) is recommended as needed and prior to medicated nasal sprays.   Gastroesophageal reflux disease, unspecified whether esophagitis present Continue reflux lifestyle modifications and diet.  May use over the counter medication as needed.    Recurrent infections Recently had double ear infection and norovirus.  Keep track of infections and antibiotics use. Get bloodwork in about 2 weeks to look at your immune system.  Return in about 4 months (around 10/02/2023).  No orders of the defined types were placed in this encounter.  Lab Orders         CBC with Differential/Platelet         Complement, total         IgG, IgA, IgM         Strep pneumoniae 23 Serotypes IgG         Diphtheria / Tetanus Antibody Panel      Diagnostics: Spirometry:  Tracings reviewed. Her effort: Good reproducible efforts. FVC: 2.23L FEV1: 1.74L, 64% predicted FEV1/FVC ratio: 78% Interpretation: Spirometry consistent with possible restrictive disease.  Please see scanned spirometry results for details. Results discussed with patient/family.  Medication List:  Current Outpatient Medications  Medication Sig Dispense Refill   albuterol  (PROVENTIL ) (2.5 MG/3ML) 0.083% nebulizer solution Take 3 mLs (2.5 mg total) by nebulization every 4 (four) hours as needed for wheezing or shortness of breath (coughing fits). 75 mL 1   albuterol  (VENTOLIN  HFA) 108 (90 Base) MCG/ACT inhaler Inhale 2 puffs into the lungs every 4 (four) hours as needed for wheezing or shortness of breath (coughing fits). 18 g 1   azelastine  (ASTELIN ) 0.1 % nasal spray one spray by Both Nostrils route 2 (two) times daily. Use in each nostril as directed     BREO ELLIPTA  200-25 MCG/ACT AEPB INHALE 1 INHALATION BY  MOUTH  INTO THE LUNGS DAILY RINSE MOUTH AFTER EACH USE 180 each 3   budesonide  (PULMICORT ) 0.5 MG/2ML nebulizer solution Take 2 mLs (0.5 mg total) by nebulization in the morning and at bedtime. Take for 1-2 weeks during asthma flare. 120 mL 2   buPROPion  (WELLBUTRIN  XL) 150 MG 24 hr tablet TAKE 1 TABLET BY MOUTH DAILY 90 tablet 1   cyclobenzaprine (FLEXERIL) 10 MG tablet Take 10 mg by mouth 3 (three) times daily as needed (migraines).     DULoxetine  (CYMBALTA ) 60 MG capsule Take 1 capsule (60 mg total) by mouth daily. 90 capsule 3   Estradiol  (YUVAFEM ) 10 MCG TABS vaginal tablet Place 1 tablet (10 mcg total) vaginally daily. 8 tablet 0   gabapentin  (NEURONTIN ) 300 MG capsule Take 600 mg by mouth at bedtime. Taking 2 tablets daily     Lasmiditan  Succinate (REYVOW ) 100 MG TABS Take 1 tablet (100 mg total) by mouth daily as needed. 8 tablet 5   Multiple Vitamin (MULTIVITAMIN WITH MINERALS) TABS tablet Take 1 tablet by mouth daily.     Respiratory Therapy Supplies (FLUTTER) DEVI Use 3 times daily as directed 1 each 0   rizatriptan  (MAXALT ) 10 MG tablet Take 1 tablet (10 mg total) by  mouth as needed for migraine. May repeat in 2 hours if needed.  Maximum 2 tablets in 24 hours. 10 tablet 5   Semaglutide -Weight Management (WEGOVY ) 2.4 MG/0.75ML SOAJ Inject 2.4 mg into the skin once a week. 6 mL 3   Spacer/Aero-Holding Chambers (AEROCHAMBER MV) inhaler Use as instructed 1 each 1   Tezepelumab -ekko (TEZSPIRE ) 210 MG/1. SOAJ Inject 210 mg into the skin every 28 (twenty-eight) days. 1.91 mL 11   traZODone  (DESYREL ) 100 MG tablet TAKE 1 TO 2 TABLETS BY MOUTH AT  BEDTIME AS NEEDED FOR SLEEP 90 tablet 7   tretinoin (RETIN-A) 0.05 % cream Apply topically.     valACYclovir  (VALTREX ) 1000 MG tablet Take 1 tablet (1,000 mg total) by mouth 2 (two) times daily. 14 tablet 5   Current Facility-Administered Medications  Medication Dose Route Frequency Provider Last Rate Last Admin   tezepelumab -ekko (TEZSPIRE ) 210  MG/1. syringe 210 mg  210 mg Subcutaneous Q28 days Trudy Fusi, DO   210 mg at 08/19/21 1138   Allergies: Allergies  Allergen Reactions   Penicillins Hives and Other (See Comments)    Yeast infections Yeast infections   Pt wants listed as an allergy    Zonisamide Hives   I reviewed her past medical history, social history, family history, and environmental history and no significant changes have been reported from her previous visit.  Review of Systems  Constitutional:  Negative for appetite change, chills, fever and unexpected weight change.  HENT:  Negative for congestion and rhinorrhea.   Eyes:  Negative for itching.  Respiratory:  Positive for cough. Negative for chest tightness, shortness of breath and wheezing.   Gastrointestinal:  Positive for constipation. Negative for abdominal pain.  Skin:  Negative for rash.  Allergic/Immunologic: Positive for environmental allergies.    Objective: BP 112/78 (BP Location: Right Arm, Patient Position: Sitting, Cuff Size: Normal)   Pulse 97   Temp 98.1 F (36.7 C) (Temporal)   Resp 20   Wt 215 lb 4.8 oz (97.7 kg)   LMP 12/20/2018   SpO2 96%   BMI 35.83 kg/m  Body mass index is 35.83 kg/m. Physical Exam Vitals and nursing note reviewed.  Constitutional:      Appearance: Normal appearance. She is well-developed.  HENT:     Head: Normocephalic and atraumatic.     Right Ear: External ear normal.     Left Ear: External ear normal.     Ears:     Comments: Slight fluid behind both tm b/l but no bulging or erythema. Scab on right ear canal.    Nose: Nose normal.     Mouth/Throat:     Mouth: Mucous membranes are moist.     Pharynx: Oropharynx is clear.  Eyes:     Conjunctiva/sclera: Conjunctivae normal.  Cardiovascular:     Rate and Rhythm: Normal rate and regular rhythm.     Heart sounds: Normal heart sounds. No murmur heard. Pulmonary:     Effort: Pulmonary effort is normal.     Breath sounds: Normal breath sounds. No  wheezing, rhonchi or rales.  Musculoskeletal:     Cervical back: Neck supple.  Skin:    General: Skin is warm.     Findings: No rash.  Neurological:     Mental Status: She is alert and oriented to person, place, and time.  Psychiatric:        Behavior: Behavior normal.    Previous notes and tests were reviewed. The plan was reviewed with the patient/family,  and all questions/concerned were addressed.  It was my pleasure to see Alyssa Clay today and participate in her care. Please feel free to contact me with any questions or concerns.  Sincerely,  Eudelia Hero, DO Allergy & Immunology  [facility]  [facility] office: [phone] [facility] office: [phone]

## 2023-06-04 ENCOUNTER — Encounter: Payer: Self-pay | Admitting: Allergy

## 2023-06-04 ENCOUNTER — Ambulatory Visit (INDEPENDENT_AMBULATORY_CARE_PROVIDER_SITE_OTHER): Payer: 59 | Admitting: Allergy

## 2023-06-04 ENCOUNTER — Other Ambulatory Visit: Payer: Self-pay

## 2023-06-04 VITALS — BP 112/78 | HR 97 | Temp 98.1°F | Resp 20 | Wt 215.3 lb

## 2023-06-04 DIAGNOSIS — B999 Unspecified infectious disease: Secondary | ICD-10-CM

## 2023-06-04 DIAGNOSIS — J9811 Atelectasis: Secondary | ICD-10-CM | POA: Diagnosis not present

## 2023-06-04 DIAGNOSIS — J310 Chronic rhinitis: Secondary | ICD-10-CM

## 2023-06-04 DIAGNOSIS — J4550 Severe persistent asthma, uncomplicated: Secondary | ICD-10-CM | POA: Diagnosis not present

## 2023-06-04 DIAGNOSIS — K219 Gastro-esophageal reflux disease without esophagitis: Secondary | ICD-10-CM

## 2023-06-04 NOTE — Patient Instructions (Addendum)
 Asthma/atelectasis Daily controller medications: take Breo 200mcg 1 puff once a day and rinse mouth after each use. Do the flutter valve and incentive spirometer. Continue Tezspire  injection every 4 weeks at home.  During respiratory infections/asthma flares:  Start Budesonide  0.5mg  nebulizer twice a day for 1-2 weeks until your breathing symptoms return to baseline.  Pretreat with albuterol  2 puffs or albuterol  nebulizer.  If you need to use your albuterol  nebulizer machine back to back within 15-30 minutes with no relief then please go to the ER/urgent care for further evaluation.  May use albuterol  rescue inhaler 2 puffs or nebulizer every 4 to 6 hours as needed for shortness of breath, chest tightness, coughing, and wheezing. May use albuterol  rescue inhaler 2 puffs 5 to 15 minutes prior to strenuous physical activities. Monitor frequency of use - if you need to use it more than twice per week on a consistent basis let us  know.  Asthma control goals:  Full participation in all desired activities (may need albuterol  before activity) Albuterol  use two times or less a week on average (not counting use with activity) Cough interfering with sleep two times or less a month Oral steroids no more than once a year No hospitalizations.  Infections Keep track of infections and antibiotics use. Get bloodwork in about 2 weeks to look at your immune system. We are ordering labs, so please allow 1-2 weeks for the results to come back. With the newly implemented Cures Act, the labs might be visible to you at the same time that they become visible to me. However, I will not address the results until all of the results are back, so please be patient.  In the meantime, continue recommendations in your patient instructions, including avoidance measures (if applicable), until you hear from me.  Chronic rhinitis Use Flonase  (fluticasone ) nasal spray 1-2 sprays per nostril once a day as needed for nasal  congestion.  Nasal saline spray (i.e., Simply Saline) or nasal saline lavage (i.e., NeilMed) is recommended as needed and prior to medicated nasal sprays.   Gastroesophageal reflux disease Continue reflux lifestyle modifications and diet.  May use over the counter medication as needed.   Follow up in 4 months or sooner if needed.

## 2023-06-08 ENCOUNTER — Encounter: Payer: Self-pay | Admitting: Neurology

## 2023-06-12 ENCOUNTER — Ambulatory Visit: Payer: 59

## 2023-06-12 VITALS — BP 148/85 | HR 87 | Temp 97.9°F | Resp 18 | Ht 65.0 in | Wt 217.5 lb

## 2023-06-12 DIAGNOSIS — G43709 Chronic migraine without aura, not intractable, without status migrainosus: Secondary | ICD-10-CM

## 2023-06-12 MED ORDER — SODIUM CHLORIDE 0.9 % IV SOLN
300.0000 mg | Freq: Once | INTRAVENOUS | Status: AC
  Administered 2023-06-12: 300 mg via INTRAVENOUS
  Filled 2023-06-12: qty 3

## 2023-06-12 NOTE — Progress Notes (Signed)
Diagnosis: Migraines  Provider:  Chilton Greathouse MD  Procedure: IV Infusion  IV Type: Peripheral, IV Location: right wrist   Vyepti (Eptinezumab-jjmr), Dose: 300 mg  Infusion Start Time: 1407  Infusion Stop Time: 1440  Post Infusion IV Care: Patient declined observation and Peripheral IV Discontinued  Discharge: Condition: Good, Destination: Home . AVS Declined  Performed by:  Marlow Baars [place]Bur[place]tt, RN

## 2023-06-13 ENCOUNTER — Encounter: Payer: Self-pay | Admitting: Neurology

## 2023-06-13 ENCOUNTER — Ambulatory Visit: Payer: 59

## 2023-06-14 ENCOUNTER — Telehealth: Payer: Self-pay | Admitting: Neurology

## 2023-06-14 NOTE — Telephone Encounter (Signed)
Caller stated she has been contacting office for several days now and also sent MyChart message concerning PA for Reyvon medication. Stated she is out and headaches have been progressing. Patient would like to know if she's able to collect samples from office until Dr Everlena Cooper is back and/or PA is confirmed.

## 2023-06-15 NOTE — Telephone Encounter (Signed)
 See my chart message

## 2023-06-18 ENCOUNTER — Telehealth: Payer: Self-pay

## 2023-06-18 ENCOUNTER — Other Ambulatory Visit (HOSPITAL_COMMUNITY): Payer: Self-pay

## 2023-06-18 ENCOUNTER — Encounter: Payer: Self-pay | Admitting: Neurology

## 2023-06-18 NOTE — Telephone Encounter (Signed)
 PA request has been Submitted. New Encounter created for follow up. For additional info see Pharmacy Prior Auth telephone encounter from 06-18-2023.

## 2023-06-18 NOTE — Telephone Encounter (Signed)
 Pharmacy Patient Advocate Encounter   Received notification from Pt Calls Messages that prior authorization for Alyssa Clay 100MG  tablets is required/requested.   Insurance verification completed.   The patient is insured through [facility] .   Per test claim: PA required; PA submitted to above mentioned insurance via CoverMyMeds Key/confirmation #/EOC [facility] Status is pending

## 2023-06-22 ENCOUNTER — Telehealth: Payer: Self-pay | Admitting: Neurology

## 2023-06-22 NOTE — Telephone Encounter (Signed)
 Pharmacy Patient Advocate Encounter  Received notification from [facility] that Prior Authorization for Reyvow 100MG  tablets has been DENIED.  Full denial letter will be uploaded to the media tab. See denial reason below.  Per you health plan's criteria, this drug is covered if you meet the following:  (1) If you have four or more headache days per month, you are currently being treated with one of the following:  (A) Elavil (amitriptyline) or Effexor (venlafaxine), unless you cannot take these drugs. (B) Depakote/Depakote ER (divalproex sodium) or Topamax (topiramate), unless you cannot take these drugs.  (C) A beta blocker (this is, atenolol, propranolol, nadolol, timolol, or metropolol), unless you cannot take these drugs.  (D) Atacand (candesartan), unless you cannot take drug.  (E) Generic lisinopril, unless you cannot take the drug.   PA #/Case ID/Reference #: [id number]

## 2023-06-22 NOTE — Telephone Encounter (Signed)
 Reyvow 100MG  tablets still awaiting PA explained and Pt asked if samples are avail, please contact back

## 2023-06-22 NOTE — Telephone Encounter (Signed)
 Patient may stop by for Samples.

## 2023-06-26 ENCOUNTER — Telehealth: Payer: Self-pay | Admitting: Pharmacist

## 2023-06-26 NOTE — Telephone Encounter (Signed)
 Telephone call from Rep at insurance, additional question needed to be answer.  Will submit claim.

## 2023-06-26 NOTE — Telephone Encounter (Signed)
 Appeal has been submitted for Reyvow. Will advise when response is received, please be advised that most companies may take 30 days to make a decision. Appeal letter and supporting documentation have been uploaded via the [facility] website and submitted as an E-Appeal.  Thank you, Dellie Burns, PharmD Clinical Pharmacist  [place]  Direct Dial: [phone]

## 2023-06-27 ENCOUNTER — Other Ambulatory Visit (HOSPITAL_COMMUNITY): Payer: Self-pay

## 2023-06-27 NOTE — Telephone Encounter (Signed)
 DR.Sokirk called to do a PEER-PEER  [phone]

## 2023-06-28 ENCOUNTER — Encounter: Payer: 59 | Admitting: Family Medicine

## 2023-06-28 NOTE — Telephone Encounter (Signed)
 PEER-PEER scheduled for 3/7 noon - 1 pm or 3:30-4, same time on Monday 10th

## 2023-06-28 NOTE — Telephone Encounter (Signed)
 Medication Samples have been provided to the patient.  Drug name: Reyvow       Strength: 100 mg        Qty: 2   LOT: [id number]  Exp.Date: 3/26  Dosing instructions: as needed  The patient has been instructed regarding the correct time, dose, and frequency of taking this medication, including desired effects and most common side effects.   Leida Lauth 1:51 PM 06/28/2023

## 2023-06-29 ENCOUNTER — Other Ambulatory Visit (HOSPITAL_COMMUNITY): Payer: Self-pay

## 2023-06-29 ENCOUNTER — Telehealth: Payer: Self-pay | Admitting: Pharmacist

## 2023-06-29 ENCOUNTER — Other Ambulatory Visit: Payer: Self-pay | Admitting: Neurology

## 2023-06-29 MED ORDER — REYVOW 100 MG PO TABS: 1.0000 | ORAL_TABLET | Freq: Every day | ORAL | 5 refills | Status: DC | PRN

## 2023-06-29 NOTE — Addendum Note (Signed)
 Addended by: Leida Lauth on: 06/29/2023 11:16 AM   Modules accepted: Orders

## 2023-06-29 NOTE — Telephone Encounter (Signed)
 Received a partial approval from the insurance:

## 2023-06-29 NOTE — Telephone Encounter (Signed)
 Patient advised Dr.Jaffe note, Script for Alyssa Clay [facility] [address]

## 2023-07-02 ENCOUNTER — Telehealth: Payer: Self-pay | Admitting: Neurology

## 2023-07-02 NOTE — Telephone Encounter (Signed)
 Pt calling for a refill to be put in for [facility] [address] [place] [address], please.

## 2023-07-02 NOTE — Telephone Encounter (Signed)
 Mychart sent to patient.

## 2023-07-02 NOTE — Telephone Encounter (Signed)
 Today, conducted a peer-to-peer explaining need for patient to have quantity 8 pills of Reyvow per month instead of 4.  Informed it was approved.

## 2023-07-02 NOTE — Telephone Encounter (Signed)
 PA request has been Approved. New Encounter has been or will be created for follow up. For additional info see Pharmacy Prior Auth telephone encounter from 2/24, 3/4 and 06/29/23.

## 2023-07-09 ENCOUNTER — Ambulatory Visit: Payer: Self-pay | Admitting: Family Medicine

## 2023-07-09 NOTE — Telephone Encounter (Signed)
 Ok to schedule with me -- she may have to wait to be seen if I do not have an opening, if something opens up then I might be able to see her sooner-- ok to be a video visit if that gets her in faster.

## 2023-07-09 NOTE — Telephone Encounter (Signed)
 Chief Complaint: worsening  sx anxiety / depression Symptoms: anxious, depressed. Unable to do normal activities. Not bathing, no eating at times due to "stomach super messed up". Headaches. Non stop crying. Panic attacks at times with chest pain does have husband to assist with care.  Frequency: few months   Pertinent Negatives: Patient denies chest pain now no difficulty breathing now . Denies thoughts of harming self or other.  Disposition: [] ED /[] Urgent Care (no appt availability in office) / [] Appointment(In office/virtual)/ []  [facility]/ [] Home Care/ [x] Refused Recommended Disposition /[] [facility]/ []  Follow-up with PCP Additional Notes:   Recommended appt today with other provider in practice and patient refused . Only wants to see PCP. Appt already on scheduled for 07/18/23. NT recommended patient to be evaluated sooner than 07/17/23. No available appt with PCP. Patient declined appt available today with other providers. Offered Urgent crisis center # and patient declined. Please advise.  Recommended if sx worsen go to ED. CAL notified of patient refusal to see other provider for sx.     Copied from CRM [id number]. Topic: Clinical - Red Word Triage >> Jul 09, 2023  9:36 AM Pascal Lux wrote: Red Word that prompted transfer to Nurse Triage: Patient called and stated for the past few months she's been feeling anxious/depressed. Reason for Disposition  Patient sounds very upset or troubled to the triager  Answer Assessment - Initial Assessment Questions 1. CONCERN: "Did anything happen that prompted you to call today?"      Worsening sx of anxiety and depression 2. ANXIETY SYMPTOMS: "Can you describe how you (your loved one; patient) have been feeling?" (e.g., tense, restless, panicky, anxious, keyed up, overwhelmed, sense of impending doom).      Not sleeping well , anxiety panic attacks. depressed 3. ONSET: "How long have you been feeling this way?" (e.g., hours,  days, weeks)     Months  4. SEVERITY: "How would you rate the level of anxiety?" (e.g., 0 - 10; or mild, moderate, severe).     7 /10 5. FUNCTIONAL IMPAIRMENT: "How have these feelings affected your ability to do daily activities?" "Have you had more difficulty than usual doing your normal daily activities?" (e.g., getting better, same, worse; self-care, school, work, interactions)     Not doing normal activities , not wanting to take shower or any activities 6. HISTORY: "Have you felt this way before?" "Have you ever been diagnosed with an anxiety problem in the past?" (e.g., generalized anxiety disorder, panic attacks, PTSD). If Yes, ask: "How was this problem treated?" (e.g., medicines, counseling, etc.)     Yes , treated with medication 7. RISK OF HARM - SUICIDAL IDEATION: "Do you ever have thoughts of hurting or killing yourself?" If Yes, ask:  "Do you have these feelings now?" "Do you have a plan on how you would do this?"     Denies  8. TREATMENT:  "What has been done so far to treat this anxiety?" (e.g., medicines, relaxation strategies). "What has helped?"     Medication  9. TREATMENT - THERAPIST: "Do you have a counselor or therapist? Name?"     Yes , counselor  10. POTENTIAL TRIGGERS: "Do you drink caffeinated beverages (e.g., coffee, colas, teas), and how much daily?" "Do you drink alcohol or use any drugs?" "Have you started any new medicines recently?"       na 11. PATIENT SUPPORT: "Who is with you now?" "Who do you live with?" "Do you have family or friends who  you can talk to?"        Husband  12. OTHER SYMPTOMS: "Do you have any other symptoms?" (e.g., feeling depressed, trouble concentrating, trouble sleeping, trouble breathing, palpitations or fast heartbeat, chest pain, sweating, nausea, or diarrhea)        Trouble sleeping no eating due to stomach in "knots".  Non stop crying headaches,panic attacks at times.  13. PREGNANCY: "Is there any chance you are pregnant?" "When was  your last menstrual period?"       na  Protocols used: Anxiety and Panic Attack-A-AH

## 2023-07-09 NOTE — Telephone Encounter (Signed)
 Spoke with the patient and informed her of the message below.  Offered an appt on tomorrow at 11:30 as I noticed there was an opening.  Patient declined as she has to take her son to [facility] tomorrow and was advised to call back for cancellations if preferred.

## 2023-07-11 ENCOUNTER — Telehealth (INDEPENDENT_AMBULATORY_CARE_PROVIDER_SITE_OTHER): Admitting: Family Medicine

## 2023-07-11 ENCOUNTER — Encounter: Payer: Self-pay | Admitting: Family Medicine

## 2023-07-11 ENCOUNTER — Telehealth: Payer: Self-pay

## 2023-07-11 DIAGNOSIS — F3341 Major depressive disorder, recurrent, in partial remission: Secondary | ICD-10-CM | POA: Diagnosis not present

## 2023-07-11 MED ORDER — BUPROPION HCL ER (XL) 300 MG PO TB24: 300.0000 mg | ORAL_TABLET | Freq: Every day | ORAL | 1 refills | Status: DC

## 2023-07-11 NOTE — Telephone Encounter (Signed)
 Teams message sent to manager at the front desk to add time slot.

## 2023-07-11 NOTE — Telephone Encounter (Signed)
 Copied from CRM [id number]. Topic: General - Other >> Jul 11, 2023  8:47 AM Adele Barthel wrote: Reason for CRM:   Patient called in yesterday and today to try scheduling an appointment with her provider. Has history of severe depression and has been struggling last 2 months with symptoms. She expressed she does not want to harm herself or others but has her grandchild and son at home and due to her symptoms is having a hard time functioning and taking care of daily activities. She wants to speak with provider about possible changing her medications. Was unable to schedule her prior to 03/25(only wants to see provider) and would like to know if she could be seen in person or by virtual visit today or tomorrow if possible.  CB#   [phone]

## 2023-07-11 NOTE — Progress Notes (Signed)
   Virtual Medical Office Visit  Patient:  Alyssa Clay      Age: 58 y.o.       Sex:  female  Date:   07/11/2023  PCP:    Karie Georges, MD   Today's Healthcare Provider: Karie Georges, MD    Assessment/Plan:   Summary assessment:  Kallista was seen today for depression.  Recurrent major depressive disorder, in partial remission (HCC) -     buPROPion HCl ER (XL); Take 1 tablet (300 mg total) by mouth daily.  Dispense: 90 tablet; Refill: 1   Pt is already maxxed out on serotonin acting medications, we discussed increasing her wellbutrin and she is agreeable to this plan. She already has an appointment with me next week.  I spent 20 minutes with the patient today counseling her on coping skills/strategies she can try at home. She already has a therapist she sees regularly.   No follow-ups on file.   She was advised to call the office or go to ER if her condition worsens    Subjective:   Alyssa Clay is a 58 y.o. female with PMH significant for: Past Medical History:  Diagnosis Date   Allergy    Anemia    Asthma    Constipation    issues x the past month -? 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 States that she hasn't really been under additional stress, states that she is just feeling more anxiety over the state of politics. States she feels like she is paralyzed and having difficulty with motivation.    She denies having any: SI, thoughts of harming herself or others           Objective/Observations  Physical Exam:  Polite and friendly Gen: NAD, resting comfortably Pulm: Normal work of breathing Neuro: Grossly normal, moves all extremities Psych: Normal affect and thought content Problem specific physical exam findings: N/A   No images are attached to the encounter or orders placed in the encounter.    Results: No results found for any visits on 07/11/23.   No results found for this or any previous visit (from the past 2160 hours).         Virtual Visit via Video   I connected with Aura Camps on 07/11/23 at  1:00 PM EDT by a video enabled telemedicine application and verified that I am speaking with the correct person using two identifiers. The limitations of evaluation and management by telemedicine and the availability of in person appointments were discussed. The patient expressed understanding and agreed to proceed.   Percentage of appointment time on video:  100% Patient location: Home Provider location: [facility] [facility] Persons participating in the virtual visit: Myself and Patient

## 2023-07-11 NOTE — Telephone Encounter (Signed)
 You can pt her on at 1 pm today as a virtual

## 2023-07-11 NOTE — Progress Notes (Signed)
 Patient unable to perform vitals at home for the virtual visit.

## 2023-07-17 ENCOUNTER — Other Ambulatory Visit: Payer: Self-pay | Admitting: Family Medicine

## 2023-07-17 DIAGNOSIS — F3341 Major depressive disorder, recurrent, in partial remission: Secondary | ICD-10-CM

## 2023-07-17 LAB — HM PAP SMEAR: HPV, high-risk: NEGATIVE

## 2023-07-18 ENCOUNTER — Encounter: Payer: Self-pay | Admitting: Family Medicine

## 2023-07-18 ENCOUNTER — Ambulatory Visit (INDEPENDENT_AMBULATORY_CARE_PROVIDER_SITE_OTHER): Admitting: Family Medicine

## 2023-07-18 VITALS — BP 118/80 | HR 91 | Temp 98.9°F | Ht 64.5 in | Wt 207.7 lb

## 2023-07-18 DIAGNOSIS — Z0000 Encounter for general adult medical examination without abnormal findings: Secondary | ICD-10-CM | POA: Diagnosis not present

## 2023-07-18 DIAGNOSIS — Z6838 Body mass index (BMI) 38.0-38.9, adult: Secondary | ICD-10-CM

## 2023-07-18 DIAGNOSIS — E785 Hyperlipidemia, unspecified: Secondary | ICD-10-CM | POA: Diagnosis not present

## 2023-07-18 DIAGNOSIS — I10 Essential (primary) hypertension: Secondary | ICD-10-CM

## 2023-07-18 DIAGNOSIS — E66812 Obesity, class 2: Secondary | ICD-10-CM

## 2023-07-18 LAB — CBC WITH DIFFERENTIAL/PLATELET
Basophils Absolute: 0.1 10*3/uL (ref 0.0–0.1)
Basophils Relative: 1.7 % (ref 0.0–3.0)
Eosinophils Absolute: 0.2 10*3/uL (ref 0.0–0.7)
Eosinophils Relative: 3.9 % (ref 0.0–5.0)
HCT: 39.9 % (ref 36.0–46.0)
Hemoglobin: 13.6 g/dL (ref 12.0–15.0)
Lymphocytes Relative: 29.4 % (ref 12.0–46.0)
Lymphs Abs: 1.5 10*3/uL (ref 0.7–4.0)
MCHC: 34.2 g/dL (ref 30.0–36.0)
MCV: 91.6 fl (ref 78.0–100.0)
Monocytes Absolute: 0.3 10*3/uL (ref 0.1–1.0)
Monocytes Relative: 6.1 % (ref 3.0–12.0)
Neutro Abs: 3.1 10*3/uL (ref 1.4–7.7)
Neutrophils Relative %: 58.9 % (ref 43.0–77.0)
Platelets: 336 10*3/uL (ref 150.0–400.0)
RBC: 4.35 Mil/uL (ref 3.87–5.11)
RDW: 13.7 % (ref 11.5–15.5)
WBC: 5.2 10*3/uL (ref 4.0–10.5)

## 2023-07-18 LAB — COMPREHENSIVE METABOLIC PANEL WITH GFR
ALT: 12 U/L (ref 0–35)
AST: 15 U/L (ref 0–37)
Albumin: 4.3 g/dL (ref 3.5–5.2)
Alkaline Phosphatase: 51 U/L (ref 39–117)
BUN: 10 mg/dL (ref 6–23)
CO2: 27 meq/L (ref 19–32)
Calcium: 9.3 mg/dL (ref 8.4–10.5)
Chloride: 103 meq/L (ref 96–112)
Creatinine, Ser: 1.02 mg/dL (ref 0.40–1.20)
GFR: 61.95 mL/min (ref >60.00)
Glucose, Bld: 80 mg/dL (ref 70–99)
Potassium: 3.6 meq/L (ref 3.5–5.1)
Sodium: 138 meq/L (ref 135–145)
Total Bilirubin: 0.5 mg/dL (ref 0.2–1.2)
Total Protein: 7.1 g/dL (ref 6.0–8.3)

## 2023-07-18 LAB — LIPID PANEL
Cholesterol: 244 mg/dL — ABNORMAL HIGH (ref 0–200)
HDL: 64.3 mg/dL (ref >39.00)
LDL Cholesterol: 153 mg/dL — ABNORMAL HIGH (ref 0–99)
NonHDL: 179.48
Total CHOL/HDL Ratio: 4
Triglycerides: 131 mg/dL (ref 0.0–149.0)
VLDL: 26.2 mg/dL (ref 0.0–40.0)

## 2023-07-18 MED ORDER — ZEPBOUND 15 MG/0.5ML ~~LOC~~ SOAJ: 15.0000 mg | SUBCUTANEOUS | 3 refills | Status: DC

## 2023-07-18 NOTE — Progress Notes (Signed)
 Complete physical exam  Patient: Alyssa Clay   DOB: [birth year]   58 y.o. Female  MRN: [id number]  Subjective:    Chief Complaint  Patient presents with   Annual Exam    Alyssa Clay is a 58 y.o. female who presents today for a complete physical exam. She reports consuming a general and slight sensitivity to milk  diet. Takes Centrum vitamin, calcium and vitamin D, no herbal supplements. Gym/ health club routine includes cardio, light weights, and goes almost every day. She generally feels well. She reports sleeping gets up 2 times per night, is going to see a massage therapist for help with migraines and chronic neck pain. She does not have additional problems to discuss today.    Most recent fall risk assessment:    06/12/2023    1:22 PM  Fall Risk   Falls in the past year? 0  Number falls in past yr: 0  Risk for fall due to : No Fall Risks  Follow up Falls evaluation completed     Most recent depression screenings:    07/11/2023   11:54 AM 06/12/2023    1:22 PM  PHQ 2/9 Scores  PHQ - 2 Score 5 0  PHQ- 9 Score 11     Vision:Within last year and wears glasses, gets ocular migraines occasionally and Dental: No current dental problems and Receives regular dental care  Patient Active Problem List   Diagnosis Date Noted   Vertigo 09/05/2022   Acute viral pharyngitis 06/06/2022   Acute left otitis media 02/21/2022   Recurrent infections 11/14/2021   Vaginal yeast infection 11/14/2021   Rash and other nonspecific skin eruption 11/14/2021   Class 2 obesity with body mass index (BMI) of 38.0 to 38.9 in adult 10/19/2021   History of COVID-19 09/12/2021   Chronic migraine without aura without status migrainosus, not intractable 05/26/2021   Lumbar radiculopathy, right 10/08/2020   SI (sacroiliac) joint dysfunction 09/16/2020   Somatic dysfunction of spine, sacral 09/16/2020   OSA (obstructive sleep apnea) 12/18/2019   FUO (fever of unknown origin) 10/21/2019   S/P  bilateral breast reduction 06/19/2019   Back pain 04/15/2019   Neck pain 04/15/2019   History of frequent upper respiratory infection 01/16/2019   Atelectasis 01/16/2019   Severe persistent asthma without complication 01/16/2019   Shortness of breath 01/01/2019   Primary osteoarthritis of both knees 07/10/2018   Hypertension 03/27/2018   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/2018   Chronic rhinitis 01/07/2018   Gastroesophageal reflux disease 01/07/2018   Borderline hyperlipidemia 06/23/2013   Depression 06/23/2013   FH: CAD (coronary artery disease) 06/23/2013   Migraine 06/23/2013   Chest pain 06/23/2013      Patient Care Team: Karie Georges, MD as PCP - General (Family Medicine) Edwinna Areola, DO as Consulting Physician (Obstetrics and Gynecology) Lowell Bouton, PA-C as Physician Assistant (Emergency Medicine) Bobbitt, Heywood Iles, MD as Consulting Physician (Allergy and Immunology) Sofie Rower (Physician Assistant) Ellamae Sia, DO as Consulting Physician (Allergy) Daiva Eves, Lisette Grinder, MD as Consulting Physician (Infectious Diseases) Drema Dallas, DO as Consulting Physician (Neurology)   Outpatient Medications Prior to Visit  Medication Sig   albuterol (PROVENTIL) (2.5 MG/3ML) 0.083% nebulizer solution Take 3 mLs (2.5 mg total) by nebulization every 4 (four) hours as needed for wheezing or shortness of breath (coughing fits).   albuterol (VENTOLIN HFA) 108 (90 Base) MCG/ACT inhaler Inhale 2 puffs into the lungs every  4 (four) hours as needed for wheezing or shortness of breath (coughing fits).   azelastine (ASTELIN) 0.1 % nasal spray one spray by Both Nostrils route 2 (two) times daily. Use in each nostril as directed   BREO ELLIPTA 200-25 MCG/ACT AEPB INHALE 1 INHALATION BY MOUTH  INTO THE LUNGS DAILY RINSE MOUTH AFTER EACH USE   budesonide (PULMICORT) 0.5 MG/2ML nebulizer solution Take 2 mLs (0.5 mg total) by nebulization in  the morning and at bedtime. Take for 1-2 weeks during asthma flare.   buPROPion (WELLBUTRIN XL) 300 MG 24 hr tablet Take 1 tablet (300 mg total) by mouth daily.   cyclobenzaprine (FLEXERIL) 10 MG tablet Take 10 mg by mouth 3 (three) times daily as needed (migraines).   DULoxetine (CYMBALTA) 60 MG capsule TAKE 1 CAPSULE BY MOUTH DAILY   Estradiol (YUVAFEM) 10 MCG TABS vaginal tablet Place 1 tablet (10 mcg total) vaginally daily.   Lasmiditan Succinate (REYVOW) 100 MG TABS Take 1 tablet (100 mg total) by mouth daily as needed.   Multiple Vitamin (MULTIVITAMIN WITH MINERALS) TABS tablet Take 1 tablet by mouth daily.   Respiratory Therapy Supplies (FLUTTER) DEVI Use 3 times daily as directed   Semaglutide-Weight Management (WEGOVY) 2.4 MG/0.75ML SOAJ Inject 2.4 mg into the skin once a week.   Spacer/Aero-Holding Chambers (AEROCHAMBER MV) inhaler Use as instructed   Tezepelumab-ekko (TEZSPIRE) 210 MG/1. SOAJ Inject 210 mg into the skin every 28 (twenty-eight) days.   traZODone (DESYREL) 100 MG tablet TAKE 1 TO 2 TABLETS BY MOUTH AT  BEDTIME AS NEEDED FOR SLEEP   tretinoin (RETIN-A) 0.05 % cream Apply topically.   valACYclovir (VALTREX) 1000 MG tablet Take 1 tablet (1,000 mg total) by mouth 2 (two) times daily.   Facility-Administered Medications Prior to Visit  Medication Dose Route Frequency Provider   tezepelumab-ekko (TEZSPIRE) 210 MG/1. syringe 210 mg  210 mg Subcutaneous Q28 days Ellamae Sia, DO    Review of Systems  HENT:  Negative for hearing loss.   Eyes:  Negative for blurred vision.  Respiratory:  Negative for shortness of breath.   Cardiovascular:  Negative for chest pain.  Gastrointestinal: Negative.   Genitourinary: Negative.   Musculoskeletal:  Negative for back pain.  Neurological:  Negative for headaches.  Psychiatric/Behavioral:  Negative for depression.        Objective:     BP 118/80   Pulse 91   Temp 98.9 F (37.2 C) (Oral)   Ht 5' 4.5" (1.638 m)   Wt  207 lb 11.2 oz (94.2 kg)   LMP 12/20/2018   SpO2 98%   BMI 35.10 kg/m    Physical Exam Vitals reviewed.  Constitutional:      Appearance: Normal appearance. She is well-groomed. She is obese.  HENT:     Right Ear: Tympanic membrane and ear canal normal.     Left Ear: Tympanic membrane and ear canal normal.     Mouth/Throat:     Mouth: Mucous membranes are moist.     Pharynx: No posterior oropharyngeal erythema.  Eyes:     Conjunctiva/sclera: Conjunctivae normal.  Neck:     Thyroid: No thyromegaly.  Cardiovascular:     Rate and Rhythm: Normal rate and regular rhythm.     Pulses: Normal pulses.     Heart sounds: S1 normal and S2 normal.  Pulmonary:     Effort: Pulmonary effort is normal.     Breath sounds: Normal breath sounds and air entry.  Abdominal:  General: Abdomen is flat. Bowel sounds are normal.     Palpations: Abdomen is soft.  Musculoskeletal:     Right lower leg: No edema.     Left lower leg: No edema.  Lymphadenopathy:     Cervical: No cervical adenopathy.  Neurological:     Mental Status: She is alert and oriented to person, place, and time. Mental status is at baseline.     Gait: Gait is intact.  Psychiatric:        Mood and Affect: Mood and affect normal.        Speech: Speech normal.        Behavior: Behavior normal.        Judgment: Judgment normal.      No results found for any visits on 07/18/23.     Assessment & Plan:    Routine Health Maintenance and Physical Exam  Immunization History  Administered Date(s) Administered   Influenza,inj,Quad PF,6+ Mos 01/22/2013, 01/20/2019, 01/19/2020, 02/02/2021   Influenza-Unspecified 01/22/2013, 01/20/2019, 01/19/2020, 02/02/2021, 01/31/2022, 01/22/2023   Moderna Covid-19 Vaccine Bivalent Booster 55yrs & up 01/31/2022   PFIZER(Purple Top)SARS-COV-2 Vaccination 07/12/2019, 08/02/2019, 02/15/2020   PNEUMOCOCCAL CONJUGATE-20 02/02/2021   Pfizer Covid-19 Vaccine Bivalent Booster 12yrs & up 03/14/2021    Pneumococcal Conjugate-13 01/24/2018   Pneumococcal Polysaccharide-23 10/21/2019   Tdap 02/25/2020   Zoster Recombinant(Shingrix) 02/25/2020, 08/22/2021    Health Maintenance  Topic Date Due   Cervical Cancer Screening (HPV/Pap Cotest)  Never done   COVID-19 Vaccine (6 - 2024-25 season) 12/24/2022   MAMMOGRAM  02/12/2025   DTaP/Tdap/Td (2 - Td or Tdap) 02/24/2030   Colonoscopy  09/16/2030   Pneumococcal Vaccine 54-63 Years old  Completed   INFLUENZA VACCINE  Completed   Hepatitis C Screening  Completed   HIV Screening  Completed   Zoster Vaccines- Shingrix  Completed   HPV VACCINES  Aged Out    Discussed health benefits of physical activity, and encouraged her to engage in regular exercise appropriate for her age and condition.  Class 2 severe obesity with serious comorbidity and body mass index (BMI) of 38.0 to 38.9 in adult, unspecified obesity type [facility]) Assessment & Plan: Refilling her wegovy 2.4 mg weekly, overall her weight has remained stable, she has not realized much weight loss over the last year, despite being on the maximum dose of wegovy. We had a long discussed about this and I recommended we switch her to Zepbound. She is having difficulty with appetite control with the wegovy, there is little weight loss. Will see her back in 3 months for weight check and follow up.  Orders: -     Zepbound; Inject 15 mg into the skin once a week.  Dispense: 6 mL; Refill: 3  Primary hypertension -     Comprehensive metabolic panel; Future -     CBC with Differential/Platelet; Future -     TSH; Future  Hyperlipidemia, unspecified hyperlipidemia type -     Lipid panel; Future  Routine general medical examination at a health care facility  Normal physical exam findings, she reports improvement in her mood since last week's visit. I counseled the patient on stress management techniques, handouts given on healthy eating and exercise. Labs ordered for annual surveillance.  Return  in 3 months (on 10/18/2023).     Karie Georges, MD

## 2023-07-18 NOTE — Patient Instructions (Signed)

## 2023-07-18 NOTE — Assessment & Plan Note (Addendum)
 Refilling her wegovy 2.4 mg weekly, overall her weight has remained stable, she has not realized much weight loss over the last year, despite being on the maximum dose of wegovy. We had a long discussed about this and I recommended we switch her to Zepbound. She is having difficulty with appetite control with the wegovy, there is little weight loss. Will see her back in 3 months for weight check and follow up.

## 2023-07-19 LAB — TSH: TSH: 1.22 u[IU]/mL (ref 0.35–5.50)

## 2023-07-20 ENCOUNTER — Other Ambulatory Visit (HOSPITAL_COMMUNITY): Payer: Self-pay

## 2023-07-20 ENCOUNTER — Encounter: Payer: Self-pay | Admitting: Family Medicine

## 2023-07-24 ENCOUNTER — Ambulatory Visit: Payer: 59 | Admitting: Neurology

## 2023-07-26 ENCOUNTER — Telehealth: Payer: Self-pay

## 2023-07-26 ENCOUNTER — Other Ambulatory Visit (HOSPITAL_COMMUNITY): Payer: Self-pay

## 2023-07-26 NOTE — Telephone Encounter (Signed)
 Pharmacy Patient Advocate Encounter  Received notification from [facility] that Prior Authorization for Zepbound 15MG /0.5ML pen-injectors has been APPROVED from 07/26/23 to 01/25/24. Ran test claim, Copay is $24.99. This test claim was processed through [facility]- copay amounts may vary at other pharmacies due to pharmacy/plan contracts, or as the patient moves through the different stages of their insurance plan.   PA #/Case ID/Reference #: [id number]

## 2023-07-26 NOTE — Telephone Encounter (Signed)
 Pharmacy Patient Advocate Encounter   Received notification from CoverMyMeds that prior authorization for Zepbound 15MG /0.5ML pen-injectors is required/requested.   Insurance verification completed.   The patient is insured through [facility] .   Per test claim: PA required; PA submitted to above mentioned insurance via CoverMyMeds Key/confirmation #/EOC [id number] Status is pending

## 2023-08-23 ENCOUNTER — Encounter: Payer: Self-pay | Admitting: Allergy

## 2023-08-25 LAB — STREP PNEUMONIAE 23 SEROTYPES IGG
Pneumo Ab Type 1*: 0.5 ug/mL — ABNORMAL LOW (ref >1.3)
Pneumo Ab Type 12 (12F)*: <0.1 ug/mL — ABNORMAL LOW (ref >1.3)
Pneumo Ab Type 14*: 28.7 ug/mL (ref >1.3)
Pneumo Ab Type 17 (17F)*: 2.4 ug/mL (ref >1.3)
Pneumo Ab Type 19 (19F)*: 3.7 ug/mL (ref >1.3)
Pneumo Ab Type 2*: 4.2 ug/mL (ref >1.3)
Pneumo Ab Type 20*: 8.3 ug/mL (ref >1.3)
Pneumo Ab Type 22 (22F)*: <0.1 ug/mL — ABNORMAL LOW (ref >1.3)
Pneumo Ab Type 23 (23F)*: 4.9 ug/mL (ref >1.3)
Pneumo Ab Type 26 (6B)*: 5.8 ug/mL (ref >1.3)
Pneumo Ab Type 3*: 0.5 ug/mL — ABNORMAL LOW (ref >1.3)
Pneumo Ab Type 34 (10A)*: >44.1 ug/mL (ref >1.3)
Pneumo Ab Type 4*: 0.8 ug/mL — ABNORMAL LOW (ref >1.3)
Pneumo Ab Type 43 (11A)*: 0.7 ug/mL — ABNORMAL LOW (ref >1.3)
Pneumo Ab Type 5*: 7.2 ug/mL (ref >1.3)
Pneumo Ab Type 51 (7F)*: 5.6 ug/mL (ref >1.3)
Pneumo Ab Type 54 (15B)*: 1.8 ug/mL (ref >1.3)
Pneumo Ab Type 56 (18C)*: 2 ug/mL (ref >1.3)
Pneumo Ab Type 57 (19A)*: 2.9 ug/mL (ref >1.3)
Pneumo Ab Type 68 (9V)*: 1 ug/mL — ABNORMAL LOW (ref >1.3)
Pneumo Ab Type 70 (33F)*: 2.8 ug/mL (ref >1.3)
Pneumo Ab Type 8*: 8 ug/mL (ref >1.3)
Pneumo Ab Type 9 (9N)*: 2.2 ug/mL (ref >1.3)

## 2023-08-25 LAB — CBC WITH DIFFERENTIAL/PLATELET
Basophils Absolute: 0.1 10*3/uL (ref 0.0–0.2)
Basos: 2 %
EOS (ABSOLUTE): 0.1 10*3/uL (ref 0.0–0.4)
Eos: 2 %
Hematocrit: 40.8 % (ref 34.0–46.6)
Hemoglobin: 13.6 g/dL (ref 11.1–15.9)
Immature Grans (Abs): 0 10*3/uL (ref 0.0–0.1)
Immature Granulocytes: 0 %
Lymphocytes Absolute: 1.6 10*3/uL (ref 0.7–3.1)
Lymphs: 30 %
MCH: 31.1 pg (ref 26.6–33.0)
MCHC: 33.3 g/dL (ref 31.5–35.7)
MCV: 93 fL (ref 79–97)
Monocytes Absolute: 0.4 10*3/uL (ref 0.1–0.9)
Monocytes: 7 %
Neutrophils Absolute: 3.1 10*3/uL (ref 1.4–7.0)
Neutrophils: 59 %
Platelets: 322 10*3/uL (ref 150–450)
RBC: 4.37 x10E6/uL (ref 3.77–5.28)
RDW: 12.7 % (ref 11.7–15.4)
WBC: 5.3 10*3/uL (ref 3.4–10.8)

## 2023-08-25 LAB — IGG, IGA, IGM
IgA/Immunoglobulin A, Serum: 191 mg/dL (ref 87–352)
IgG (Immunoglobin G), Serum: 989 mg/dL (ref 586–1602)
IgM (Immunoglobulin M), Srm: 130 mg/dL (ref 26–217)

## 2023-08-25 LAB — DIPHTHERIA / TETANUS ANTIBODY PANEL
Diphtheria Ab: 0.49 [IU]/mL (ref <0.10)
Tetanus Ab, IgG: 2.94 [IU]/mL (ref <0.10)

## 2023-08-25 LAB — COMPLEMENT, TOTAL: Compl, Total (CH50): >60 U/mL (ref >41)

## 2023-08-27 ENCOUNTER — Encounter: Payer: Self-pay | Admitting: Allergy

## 2023-08-27 ENCOUNTER — Encounter: Payer: Self-pay | Admitting: Family Medicine

## 2023-08-30 ENCOUNTER — Ambulatory Visit (INDEPENDENT_AMBULATORY_CARE_PROVIDER_SITE_OTHER): Admitting: Family Medicine

## 2023-08-30 ENCOUNTER — Encounter: Payer: Self-pay | Admitting: Family Medicine

## 2023-08-30 VITALS — BP 120/78 | HR 100 | Temp 98.1°F | Ht 64.5 in | Wt 201.0 lb

## 2023-08-30 DIAGNOSIS — R110 Nausea: Secondary | ICD-10-CM

## 2023-08-30 MED ORDER — ONDANSETRON HCL 4 MG PO TABS: 4.0000 mg | ORAL_TABLET | Freq: Three times a day (TID) | ORAL | 1 refills | Status: AC | PRN

## 2023-08-30 NOTE — Progress Notes (Signed)
 Established Patient Office Visit  Subjective   Patient ID: Alyssa Clay, female    DOB: [birth year]  Age: 58 y.o. MRN: [id number]  Chief Complaint  Patient presents with   Constipation    Patient complains of constipation x1 month, taking Miralax  with relief   Stomach "feels like nerves", lack of appetite   Dizziness    X1 month    Pt is reporting new GI symptoms, states that she is having alternating constipation and diarrhea,  states she is still getting this "nervous feeling" in her stomach, states that it isn't really nausea, lack of appetite.   States that the dizziness is random, started since we increased the wellbutrin  to 300 mg daily. States she never transitioned to the zepbound , is still on the wegovy  only. States that the wellbutrin  is definitely helping with her appetite and her mood and is not sure if she wants to reduce the dose. I counseled the patient on ways we can manage the dizziness and the stomach symptoms. Pt has lost 16 pounds since her visit.  Dizziness    Current Outpatient Medications  Medication Instructions   albuterol  (PROVENTIL ) 2.5 mg, Nebulization, Every 4 hours PRN   albuterol  (VENTOLIN  HFA) 108 (90 Base) MCG/ACT inhaler 2 puffs, Inhalation, Every 4 hours PRN   azelastine  (ASTELIN ) 0.1 % nasal spray one spray by Both Nostrils route 2 (two) times daily. Use in each nostril as directed   BREO ELLIPTA  200-25 MCG/ACT AEPB INHALE 1 INHALATION BY MOUTH  INTO THE LUNGS DAILY RINSE MOUTH AFTER EACH USE   budesonide  (PULMICORT ) 0.5 mg, Nebulization, 2 times daily, Take for 1-2 weeks during asthma flare.   buPROPion  (WELLBUTRIN  XL) 300 mg, Oral, Daily   cyclobenzaprine (FLEXERIL) 10 mg, 3 times daily PRN   DULoxetine  (CYMBALTA ) 60 mg, Oral, Daily   Estradiol  (YUVAFEM ) 10 mcg, Vaginal, Daily   Multiple Vitamin (MULTIVITAMIN WITH MINERALS) TABS tablet 1 tablet, Daily   ondansetron  (ZOFRAN ) 4 mg, Oral, Every 8 hours PRN   Respiratory Therapy Supplies  (FLUTTER) DEVI Use 3 times daily as directed   Reyvow  100 mg, Oral, Daily PRN   Spacer/Aero-Holding Chambers (AEROCHAMBER MV) inhaler Use as instructed   Tezspire  210 mg, Subcutaneous, Every 28 days   traZODone  (DESYREL ) 100-200 mg, Oral, At bedtime PRN, for sleep   tretinoin (RETIN-A) 0.05 % cream Apply topically.   valACYclovir  (VALTREX ) 1,000 mg, Oral, 2 times daily   Wegovy  2.4 mg, Subcutaneous, Weekly    Patient Active Problem List   Diagnosis Date Noted   Vertigo 09/05/2022   Acute viral pharyngitis 06/06/2022   Acute left otitis media 02/21/2022   Recurrent infections 11/14/2021   Vaginal yeast infection 11/14/2021   Rash and other nonspecific skin eruption 11/14/2021   Class 2 obesity with body mass index (BMI) of 38.0 to 38.9 in adult 10/19/2021   History of COVID-19 09/12/2021   Chronic migraine without aura without status migrainosus, not intractable 05/26/2021   Lumbar radiculopathy, right 10/08/2020   SI (sacroiliac) joint dysfunction 09/16/2020   Somatic dysfunction of spine, sacral 09/16/2020   OSA (obstructive sleep apnea) 12/18/2019   FUO (fever of unknown origin) 10/21/2019   S/P bilateral breast reduction 06/19/2019   Back pain 04/15/2019   Neck pain 04/15/2019   History of frequent upper respiratory infection 01/16/2019   Atelectasis 01/16/2019   Severe persistent asthma without complication 01/16/2019   Shortness of breath 01/01/2019   Primary osteoarthritis of both knees 07/10/2018   Hypertension 03/27/2018  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     Objective:     BP 120/78   Pulse 100   Temp 98.1 F (36.7 C) (Oral)    Ht 5' 4.5" (1.638 m)   Wt 201 lb (91.2 kg)   LMP 12/20/2018   SpO2 98%   BMI 33.97 kg/m    Physical Exam Vitals reviewed.  Constitutional:      Appearance: Normal appearance. She is obese.  Pulmonary:     Effort: Pulmonary effort is normal.  Abdominal:     General: Bowel sounds are normal. There is no distension.     Palpations: Abdomen is soft.     Tenderness: There is no abdominal tenderness.  Neurological:     Mental Status: She is alert and oriented to person, place, and time. Mental status is at baseline.  Psychiatric:        Mood and Affect: Mood normal.        Behavior: Behavior normal.      No results found for any visits on 08/30/23.    The 10-year ASCVD risk score (Arnett DK, et al., 2019) is: 1.9%    Assessment & Plan:  Nausea -     Ondansetron  HCl; Take 1 tablet (4 mg total) by mouth every 8 (eight) hours as needed for nausea or vomiting.  Dispense: 30 tablet; Refill: 1   I think these symptoms are side effects from the wellbutrin , I advised we could try managing the side effects with zofran  and increasing salt in the diet to help support blood pressure. I will see her back in 6 weeks to determine if the medication needs to be adjusted. For now will continue present regimen.   No follow-ups on file.    Aida House, MD

## 2023-08-30 NOTE — Patient Instructions (Addendum)
 Pepcid  AC-- 1-2 tablets daily as needed for possible acid reflux  Add PRIME or Liquid IV packet once daily

## 2023-09-09 ENCOUNTER — Ambulatory Visit (HOSPITAL_COMMUNITY)
Admission: EM | Admit: 2023-09-09 | Discharge: 2023-09-09 | Disposition: A | Discharge status: Home / Self Care | Attending: Emergency Medicine | Admitting: Emergency Medicine

## 2023-09-09 ENCOUNTER — Encounter (HOSPITAL_COMMUNITY): Payer: Self-pay | Admitting: Emergency Medicine

## 2023-09-09 DIAGNOSIS — W57XXXA Bitten or stung by nonvenomous insect and other nonvenomous arthropods, initial encounter: Secondary | ICD-10-CM

## 2023-09-09 DIAGNOSIS — S90561A Insect bite (nonvenomous), right ankle, initial encounter: Secondary | ICD-10-CM

## 2023-09-09 DIAGNOSIS — L03115 Cellulitis of right lower limb: Secondary | ICD-10-CM

## 2023-09-09 MED ORDER — SULFAMETHOXAZOLE-TRIMETHOPRIM 800-160 MG PO TABS: 1.0000 | ORAL_TABLET | Freq: Two times a day (BID) | ORAL | 0 refills | Status: AC

## 2023-09-09 NOTE — ED Triage Notes (Signed)
 Pt report insect bite today on her right ankle. Ankle is red ans swollen, pt reports itchiness.

## 2023-09-09 NOTE — Discharge Instructions (Signed)
 Starting Bactrim  twice daily for 7 days for infection prevention. You can continue to use hydrocortisone cream to the area to help with itching and swelling. You can continue to take Claritin to help with itching as well. If you notice increased swelling, spreading of redness, or lots of pus drainage from the area return here for reevaluation. If you develop fever, weakness, confusion, difficulty walking, or feeling like you might pass out please seek immediate medical treatment in the emergency department.

## 2023-09-09 NOTE — ED Provider Notes (Signed)
 [facility]    CSN: [id number] Arrival date & time: 09/09/23  1640      History   Chief Complaint Chief Complaint  Patient presents with   Insect Bite    HPI Alyssa Clay is a 58 y.o. female.   Patient presents with right ankle swelling and redness that began today.  Patient states that around 11 AM this morning she was sitting on the couch and felt a sudden pain to her ankle and then began to have some swelling and redness.  Patient states that throughout the day the swelling has increased some and the area is very itchy.  Patient reports that she put hydrocortisone cream and took Claritin with relief of itching, but was concerned because of the swelling.  Patient states that she had something similar to this occurred to her in the past up to her left thigh.  Patient states that she took Bactrim  with relief at that time.  The history is provided by the patient and medical records.    Past Medical History:  Diagnosis Date   Allergy    Anemia    Asthma    Constipation    issues x the past month -? 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 USE   Enlarged ovary 09/15/2020   noted on ct scan, per pt   FUO (fever of unknown origin) 10/21/2019   GERD (gastroesophageal reflux disease)    Hypertension    PAST hx    Migraine     Patient Active Problem List   Diagnosis Date Noted   Vertigo 09/05/2022   Acute viral pharyngitis 06/06/2022   Acute left otitis media 02/21/2022   Recurrent infections 11/14/2021   Vaginal yeast infection 11/14/2021   Rash and other nonspecific skin eruption 11/14/2021   Class 2 obesity with body mass index (BMI) of 38.0 to 38.9 in adult 10/19/2021   History of COVID-19 09/12/2021   Chronic migraine without aura without status migrainosus, not intractable 05/26/2021   Lumbar radiculopathy, right 10/08/2020   SI (sacroiliac) joint dysfunction 09/16/2020    Somatic dysfunction of spine, sacral 09/16/2020   OSA (obstructive sleep apnea) 12/18/2019   FUO (fever of unknown origin) 10/21/2019   S/P bilateral breast reduction 06/19/2019   Back pain 04/15/2019   Neck pain 04/15/2019   History of frequent upper respiratory infection 01/16/2019   Atelectasis 01/16/2019   Severe persistent asthma without complication 01/16/2019   Shortness of breath 01/01/2019   Primary osteoarthritis of both knees 07/10/2018   Hypertension 03/27/2018   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/2018   Chronic rhinitis 01/07/2018   Gastroesophageal reflux disease 01/07/2018   Borderline hyperlipidemia 06/23/2013   Depression 06/23/2013   FH: CAD (coronary artery disease) 06/23/2013   Migraine 06/23/2013   Chest pain 06/23/2013    Past Surgical History:  Procedure Laterality Date   APPENDECTOMY     BREAST REDUCTION SURGERY Bilateral 06/12/2019   Procedure: BREAST REDUCTION WITH LIPOSUCTION;  Surgeon: Thornell Flirt, DO;  Location: [facility];  Service: Plastics;  Laterality: Bilateral;  4 hours, please   CESAREAN SECTION  1994   DILATION AND CURETTAGE OF UTERUS     multiple due to miscarriages   KNEE ARTHROSCOPY Left 2013   LAPAROSCOPIC APPENDECTOMY N/A 12/08/2018   Procedure: APPENDECTOMY LAPAROSCOPIC;  Surgeon: Dareen Ebbing, MD;  Location: [facility];  Service:  General;  Laterality: N/A;   REDUCTION MAMMAPLASTY Bilateral 2021   TENNIS ELBOW RELEASE/NIRSCHEL PROCEDURE  2009   WISDOM TOOTH EXTRACTION      OB History     Gravida  5   Para  1   Term  1   Preterm      AB  4   Living  2      SAB  4   IAB      Ectopic      Multiple      Live Births  1            Home Medications    Prior to Admission medications   Medication Sig Start Date End Date Taking? Authorizing Provider  sulfamethoxazole -trimethoprim  (BACTRIM  DS) 800-160 MG tablet Take 1 tablet by mouth 2 (two) times daily for 7 days.  09/09/23 09/16/23 Yes Rosevelt Constable, Dulcemaria Bula A, NP  albuterol  (PROVENTIL ) (2.5 MG/3ML) 0.083% nebulizer solution Take 3 mLs (2.5 mg total) by nebulization every 4 (four) hours as needed for wheezing or shortness of breath (coughing fits). 12/18/22   Trudy Fusi, DO  albuterol  (VENTOLIN  HFA) 108 (90 Base) MCG/ACT inhaler Inhale 2 puffs into the lungs every 4 (four) hours as needed for wheezing or shortness of breath (coughing fits). 12/18/22   Trudy Fusi, DO  azelastine  (ASTELIN ) 0.1 % nasal spray one spray by Both Nostrils route 2 (two) times daily. Use in each nostril as directed 04/27/23   [provider]  BREO ELLIPTA  200-25 MCG/ACT AEPB INHALE 1 INHALATION BY MOUTH  INTO THE LUNGS DAILY RINSE MOUTH AFTER EACH USE 05/16/23   Trudy Fusi, DO  budesonide  (PULMICORT ) 0.5 MG/2ML nebulizer solution Take 2 mLs (0.5 mg total) by nebulization in the morning and at bedtime. Take for 1-2 weeks during asthma flare. 12/18/22   Trudy Fusi, DO  buPROPion  (WELLBUTRIN  XL) 300 MG 24 hr tablet Take 1 tablet (300 mg total) by mouth daily. 07/11/23   Aida House, MD  cyclobenzaprine (FLEXERIL) 10 MG tablet Take 10 mg by mouth 3 (three) times daily as needed (migraines).    [provider]  DULoxetine  (CYMBALTA ) 60 MG capsule TAKE 1 CAPSULE BY MOUTH DAILY 07/18/23   Aida House, MD  Estradiol  (YUVAFEM ) 10 MCG TABS vaginal tablet Place 1 tablet (10 mcg total) vaginally daily. 08/30/20   Koberlein, Junell C, MD  Lasmiditan  Succinate (REYVOW ) 100 MG TABS Take 1 tablet (100 mg total) by mouth daily as needed. 06/29/23   Merriam Abbey, DO  Multiple Vitamin (MULTIVITAMIN WITH MINERALS) TABS tablet Take 1 tablet by mouth daily.    [provider]  ondansetron  (ZOFRAN ) 4 MG tablet Take 1 tablet (4 mg total) by mouth every 8 (eight) hours as needed for nausea or vomiting. 08/30/23   Aida House, MD  Respiratory Therapy Supplies (FLUTTER) DEVI Use 3 times daily as directed 01/22/19   Mannam, Praveen, MD   Semaglutide -Weight Management (WEGOVY ) 2.4 MG/0.75ML SOAJ Inject 2.4 mg into the skin once a week. 04/05/23   Aida House, MD  Spacer/Aero-Holding Chambers (AEROCHAMBER MV) inhaler Use as instructed 04/28/22   Ethlyn Herd, MD  Tezepelumab Virginia Grim (TEZSPIRE ) 210 MG/1. SOAJ Inject 210 mg into the skin every 28 (twenty-eight) days. 01/03/23   Trudy Fusi, DO  traZODone  (DESYREL ) 100 MG tablet TAKE 1 TO 2 TABLETS BY MOUTH AT  BEDTIME AS NEEDED FOR SLEEP 03/05/23   Aida House, MD  tretinoin (RETIN-A) 0.05 % cream Apply topically.  03/30/20   [provider]  valACYclovir  (VALTREX ) 1000 MG tablet Take 1 tablet (1,000 mg total) by mouth 2 (two) times daily. 06/22/22   Aida House, MD    Family History Family History  Problem Relation Age of Onset   Depression Mother    Lymphoma Mother 31   Lung cancer Mother    Hypertension Father    Heart attack Father 45   Hypertension Brother    High blood pressure Brother    High Cholesterol Brother    Healthy Daughter    Colon cancer Maternal Aunt 28   Esophageal cancer Neg Hx    Pancreatic cancer Neg Hx    Stomach cancer Neg Hx    Colon polyps Neg Hx    Rectal cancer Neg Hx     Social History Social History   Tobacco Use   Smoking status: Never   Smokeless tobacco: Never  Vaping Use   Vaping status: Never Used  Substance Use Topics   Alcohol use: Yes    Comment: occ 2-3 beers a month    Drug use: Never     Allergies   Penicillins and Zonisamide   Review of Systems Review of Systems  Per HPI  Physical Exam Triage Vital Signs ED Triage Vitals [09/09/23 1717]  Encounter Vitals Group     BP 113/77     Systolic BP Percentile      Diastolic BP Percentile      Pulse Rate 100     Resp 18     Temp 98.2 F (36.8 C)     Temp Source Oral     SpO2 94 %     Weight      Height      Head Circumference      Peak Flow      Pain Score 8     Pain Loc      Pain Education      Exclude from Growth  Chart    No data found.  Updated Vital Signs BP 113/77 (BP Location: Right Arm)   Pulse 100   Temp 98.2 F (36.8 C) (Oral)   Resp 18   LMP 12/20/2018   SpO2 94%   Visual Acuity Right Eye Distance:   Left Eye Distance:   Bilateral Distance:    Right Eye Near:   Left Eye Near:    Bilateral Near:     Physical Exam Vitals and nursing note reviewed.  Constitutional:      General: She is awake. She is not in acute distress.    Appearance: Normal appearance. She is well-developed and well-groomed. She is not ill-appearing.  Skin:    General: Skin is warm and dry.     Findings: Erythema present.     Comments: Swelling and erythema noted to lateral aspect of right ankle as pictured below.  Neurological:     Mental Status: She is alert.  Psychiatric:        Behavior: Behavior is cooperative.      UC Treatments / Results  Labs (all labs ordered are listed, but only abnormal results are displayed) Labs Reviewed - No data to display  EKG   Radiology No results found.  Procedures Procedures (including critical care time)  Medications Ordered in UC Medications - No data to display  Initial Impression / Assessment and Plan / UC Course  I have reviewed the triage vital signs and the nursing notes.  Pertinent labs & imaging results that were  available during my care of the patient were reviewed by me and considered in my medical decision making (see chart for details).     Patient is well-appearing.  Vitals are stable.  Upon assessment there is swelling and erythema noted to the lateral aspect of the right ankle as pictured above.  Prescribed Bactrim  for cellulitis coverage.  Recommended continuing hydrocortisone cream and Claritin to help with itching.  Discussed return and strict ER precautions. Final Clinical Impressions(s) / UC Diagnoses   Final diagnoses:  Insect bite of right ankle, initial encounter  Cellulitis of right lower extremity     Discharge  Instructions      Starting Bactrim  twice daily for 7 days for infection prevention. You can continue to use hydrocortisone cream to the area to help with itching and swelling. You can continue to take Claritin to help with itching as well. If you notice increased swelling, spreading of redness, or lots of pus drainage from the area return here for reevaluation. If you develop fever, weakness, confusion, difficulty walking, or feeling like you might pass out please seek immediate medical treatment in the emergency department.  ED Prescriptions     Medication Sig Dispense Auth. Provider   sulfamethoxazole -trimethoprim  (BACTRIM  DS) 800-160 MG tablet Take 1 tablet by mouth 2 (two) times daily for 7 days. 14 tablet Levora Reas A, NP      PDMP not reviewed this encounter.   Levora Reas A, NP 09/09/23 1758

## 2023-09-10 ENCOUNTER — Ambulatory Visit: Payer: 59

## 2023-09-10 VITALS — BP 133/80 | HR 83 | Temp 98.2°F | Resp 18 | Ht 65.5 in | Wt 201.6 lb

## 2023-09-10 DIAGNOSIS — G43709 Chronic migraine without aura, not intractable, without status migrainosus: Secondary | ICD-10-CM | POA: Diagnosis not present

## 2023-09-10 MED ORDER — SODIUM CHLORIDE 0.9 % IV SOLN
300.0000 mg | Freq: Once | INTRAVENOUS | Status: AC
  Administered 2023-09-10: 300 mg via INTRAVENOUS
  Filled 2023-09-10: qty 3

## 2023-09-10 MED ORDER — SODIUM CHLORIDE 0.9% FLUSH: 10.0000 mL | Freq: Two times a day (BID) | INTRAVENOUS | Status: DC

## 2023-09-10 NOTE — Progress Notes (Signed)
 Diagnosis: Migraine  Provider:  Phyllis Breeze MD  Procedure: IV Infusion  IV Type: Peripheral, IV Location: R Forearm  Vyepti  (Eptinezumab -jjmr), Dose: 300 mg  Infusion Start Time: 1135  Infusion Stop Time: 1210  Post Infusion IV Care: Patient declined observation and Peripheral IV Discontinued  Discharge: Condition: Good, Destination: Home . AVS Declined  Performed by:  Adlai Sinning, RN

## 2023-09-13 ENCOUNTER — Ambulatory Visit (INDEPENDENT_AMBULATORY_CARE_PROVIDER_SITE_OTHER)

## 2023-09-13 ENCOUNTER — Telehealth: Payer: Self-pay | Admitting: Neurology

## 2023-09-13 DIAGNOSIS — G43009 Migraine without aura, not intractable, without status migrainosus: Secondary | ICD-10-CM

## 2023-09-13 MED ORDER — DIPHENHYDRAMINE HCL 50 MG/ML IJ SOLN
50.0000 mg | Freq: Once | INTRAMUSCULAR | Status: AC
  Administered 2023-09-13: 25 mg via INTRAMUSCULAR

## 2023-09-13 MED ORDER — METOCLOPRAMIDE HCL 5 MG/ML IJ SOLN
10.0000 mg | Freq: Once | INTRAMUSCULAR | Status: AC
  Administered 2023-09-13: 10 mg via INTRAMUSCULAR

## 2023-09-13 MED ORDER — KETOROLAC TROMETHAMINE 60 MG/2ML IM SOLN
60.0000 mg | Freq: Once | INTRAMUSCULAR | Status: AC
Start: 2023-09-13 — End: 2023-09-13
  Administered 2023-09-13: 60 mg via INTRAMUSCULAR

## 2023-09-13 NOTE — Telephone Encounter (Signed)
 Pt called in stating she has a migraine and would like to see if she can get a headache cocktail?

## 2023-09-19 NOTE — Progress Notes (Unsigned)
 NEUROLOGY FOLLOW UP OFFICE NOTE  MAYSUN MEDITZ [id number]  Assessment/Plan:   Migraine without aura, without status migrainosus, not intractable Right vestibular neuritis, resolved   Migraine prevention:  Vyepti  to 300mg .  Continue PT/dry needling, yoga, exercise, massage Advised to preemptively take a Reyvow  prior to Vyepti  infusion. Migraine rescue:  Reyvow .  As she is only allotted 4 a month, will prescribe again rizatriptan  10mg ; Zofran  for nausea Limit use of pain relievers to no more than 9 days out of the month to prevent risk of rebound or medication-overuse headache. Keep headache diary Follow up 6 months.  Subjective:  GLENDORIS NODARSE is a 58 year old female with asthma who follows up for migraine and vertigo.  History supplemented by ENTnotes and accompanying husband.     UPDATE: Migraines: Increased Vyepti  to 300mg . She has had a significant amount of stress and depression from January through April.  She had work-related stress.  She is worried about current events.  Her father-in-law was seriously injured.  She increased seeing her therapist and her PCP increased her antidepressant which has greatly helped.  She started seeing a specialized migraine physical therapist who has been working on her with neck stretches and dry needling.  She has also been going to massage therapy.  In addition, she alternates days going to the gym and yoga.  Despite all of the stressors, migraines are overall improved. Intensity:  8-10/10 Duration:  4-6 hours with Reyvow  or 1 day with rizatriptan  as she had leftover (previously they lasted 2-3 days) Frequency:  9 to 10 days a month (down from 5 days a week) Since increasing the Vyepti , she notes she has a migraine after the infusion for 2 days.  Vertigo: Stable   Medications: Frequency of abortive medication: as needed Current NSAIDS/analgesics:  none Current triptans:  none Current ergotamine: none Current anti-emetic:  Zofran   4mg  Current muscle relaxants:  none Current Antihypertensive medications:  none Current Antidepressant medications:  Cymbalta  60mg  daily, Wellbutrin , trazodone  Current Anticonvulsant medications:  none Current anti-CGRP:  Vypeti 300mg  Current Vitamins/Herbal/Supplements:  none Current Antihistamines/Decongestants:  none Other therapy:  Reyvow  100mg ; acupuncture birth control/hormone:  estradiol  Other treatment:  physical therapy/neck stretching, dry needling, massage, yoga, gym, mouth guard     HISTORY: Migraines: Onset:  42 or 58 years old with onset of menses menstrual until 60 and then more often.  Pregnant at 27 -  Location:  frontal, temporal, top of head bilaterally, now often right periorbital, right jaw  Quality:  squeezing, stabbing Intensity:  moderate to severe Aura:  sometimes zigzag pattern in visioin Prodrome:  absent Associated symptoms:  Photophobia, phonophobia, osmophobia.  She denies associated nausea, vomiting, autonomic symptoms, unilateral numbness or weakness. Duration:  1 to 3 days Frequency:  5 to 12 a month Triggers:  bright lights, stress Relieving factors:  rest, dark room Activity:  aggravates   MRI of brain without contrast on 10/14/2012 was normal     Vertigo - suspect right vestibular neuritis: In early April 2024, she developed a viral ilness, including low-grade fever, sore throat, and headache.  A couple of days, she developed dizziness, described as a spinning sensation triggered by change in position.  Labs for COVID, flu and strep A were negative.  She was prescribed meclizine  and given instructions for home Epley maneuver which were ineffective.  She saw PT.  Exam revealed right vestibular hypofunction, including positive right Head Impulse Test.  She went to two sessions of vestibular rehab with PT  which were not helpful.  Always experiences sensation of dizziness but dull when still, aggravated with movement.  Associated nausea.  Denies aural  fullness, otalgia, tinnitus or hearing loss.  She saw ENT (Dr. Silvestre Drum) in July 2024.  Nasal endoscopy was unremarkable.  VNG performed on 11/21/2022 showed abnormal saccades and smooth pursuit but she did not demonstrate nystagmus or subjective dizziness and optokinetic testing, positional testing and calorics were normal.  "Impression:  A central pathology cannot be ruled out based on today's abnormal test findings:  abnormal smooth pursuit and saccades.Aaron AasAaron AasConsidering her improved symptoms, precipitous event to dizziness and normal calorics, a compensated peripheral pathology is likely.  There was no indication of BPPV."  MRI of brain/IAC with and without contrast on 11/24/2022 personally reviewed was normal.  Symptoms subsequently resolved.   Past medications: Past NSAIDS/analgesics:  ibuprofen , Tylenol , naproxen , Fioricet, Codeine , darvocet, Norco, Tylenol  #3, tramadol, Midrin, toradol , Elyxyb Past abortive triptans:  Amerge, Frova, Relpax, sumatriptan  tab/[place], Maxalt , Zomig, Treximet Past abortive ergotamine: Trudhesa Past muscle relaxants:  tizanidine, skelaxin, Flexeril Past anti-emetic:  promethazine , Compazine Past antihypertensive medications:  propranolol, atenolol, verapamil, metoprolol Past antidepressant medications:  amitriptyline, sertraline, Lexapro, Celexa, Prozac Past anticonvulsant medications:  topiramate, Depakote, zonisamide, gabapentin  Past anti-CGRP:  Qulipta, Ubrelvy, Nurtec, Aimovig, Zavzpret Past vitamins/Herbal/Supplements:  Migrelief BID Past antihistamines/decongestants:  none Other past therapies:  Botox (15 years ago, lost efficacy), sphenopalatine ganglion block, trigger point injections, acupuncture  PAST MEDICAL HISTORY: Past Medical History:  Diagnosis Date   Allergy    Anemia    Asthma    Constipation    issues x the past month -? 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 75 mL 1   albuterol  (VENTOLIN  HFA) 108 (90 Base) MCG/ACT inhaler Inhale 2 puffs into the lungs every 4 (four) hours as needed for wheezing or shortness of breath (coughing fits). 18 g 1   azelastine  (ASTELIN ) 0.1 % nasal spray one spray by Both Nostrils route 2 (two) times daily. Use in each nostril as directed     BREO ELLIPTA  200-25 MCG/ACT AEPB INHALE 1 INHALATION BY MOUTH  INTO THE LUNGS DAILY RINSE MOUTH AFTER EACH USE 180 each 3   budesonide  (PULMICORT ) 0.5 MG/2ML nebulizer solution Take 2 mLs (0.5 mg total) by nebulization in the morning and at bedtime. Take for 1-2 weeks during asthma flare. 120 mL 2   buPROPion  (WELLBUTRIN  XL) 300 MG 24 hr tablet Take 1 tablet (300 mg total) by mouth daily. 90 tablet 1   cyclobenzaprine (FLEXERIL) 10 MG tablet Take 10 mg by mouth 3 (three) times daily as needed (migraines).     DULoxetine  (CYMBALTA ) 60 MG capsule TAKE 1 CAPSULE BY MOUTH DAILY 90 capsule 3   Estradiol  (YUVAFEM ) 10 MCG TABS vaginal tablet Place 1 tablet (10 mcg total) vaginally daily. 8 tablet 0   Lasmiditan  Succinate (REYVOW ) 100 MG TABS Take  1 tablet (100 mg total) by mouth daily as needed. 4 tablet 5   Multiple Vitamin (MULTIVITAMIN WITH MINERALS) TABS tablet Take 1 tablet by mouth daily.     ondansetron  (ZOFRAN ) 4 MG tablet Take 1 tablet (4 mg total) by mouth every 8 (eight) hours as needed for nausea or vomiting. 30 tablet 1   Respiratory Therapy Supplies (FLUTTER) DEVI Use 3 times daily as directed 1 each 0   Semaglutide -Weight  Management (WEGOVY ) 2.4 MG/0.75ML SOAJ Inject 2.4 mg into the skin once a week. 6 mL 3   Spacer/Aero-Holding Chambers (AEROCHAMBER MV) inhaler Use as instructed 1 each 1   Tezepelumab -ekko (TEZSPIRE ) 210 MG/1. SOAJ Inject 210 mg into the skin every 28 (twenty-eight) days. 1.91 mL 11   traZODone  (DESYREL ) 100 MG tablet TAKE 1 TO 2 TABLETS BY MOUTH AT  BEDTIME AS NEEDED FOR SLEEP 90 tablet 7   tretinoin (RETIN-A) 0.05 % cream Apply topically.     valACYclovir  (VALTREX ) 1000 MG tablet Take 1 tablet (1,000 mg total) by mouth 2 (two) times daily. 14 tablet 5   Current Facility-Administered Medications on File Prior to Visit  Medication Dose Route Frequency Provider Last Rate Last Admin   tezepelumab -ekko (TEZSPIRE ) 210 MG/1. syringe 210 mg  210 mg Subcutaneous Q28 days Trudy Fusi, DO   210 mg at 08/19/21 1138    ALLERGIES: Allergies  Allergen Reactions   Penicillins Hives and Other (See Comments)    Yeast infections Yeast infections   Pt wants listed as an allergy    Zonisamide Hives    FAMILY HISTORY: Family History  Problem Relation Age of Onset   Depression Mother    Lymphoma Mother 45   Lung cancer Mother    Hypertension Father    Heart attack Father 7   Hypertension Brother    High blood pressure Brother    High Cholesterol Brother    Healthy Daughter    Colon cancer Maternal Aunt 3   Esophageal cancer Neg Hx    Pancreatic cancer Neg Hx    Stomach cancer Neg Hx    Colon polyps Neg Hx    Rectal cancer Neg Hx       Objective:  Blood pressure 130/68, pulse (!) 105, height 5\' 5"  (1.651 m), weight 201 lb (91.2 kg), last menstrual period 12/20/2018, SpO2 97%. General: No acute distress.  Patient appears well-groomed.   Head:  Normocephalic/atraumatic Eyes:  Fundi examined but not visualized Neck: supple, no paraspinal tenderness, full range of motion Heart:  Regular rate and rhythm Lungs:  Clear to auscultation bilaterally Back: No paraspinal  tenderness Neurological Exam: alert and oriented.  Speech fluent and not dysarthric, language intact.  CN II-XII intact. Bulk and tone normal, muscle strength 5/5 throughout.  Sensation to light touch intact.  Deep tendon reflexes 2+ throughout, toes downgoing.  Finger to nose testing intact.  Gait normal, Romberg negative.   Janne Members, DO  CC: Osborn Blaze, MD

## 2023-09-20 ENCOUNTER — Encounter: Payer: Self-pay | Admitting: Neurology

## 2023-09-20 ENCOUNTER — Ambulatory Visit (INDEPENDENT_AMBULATORY_CARE_PROVIDER_SITE_OTHER): Payer: 59 | Admitting: Neurology

## 2023-09-20 VITALS — BP 130/68 | HR 105 | Ht 65.0 in | Wt 201.0 lb

## 2023-09-20 DIAGNOSIS — G43009 Migraine without aura, not intractable, without status migrainosus: Secondary | ICD-10-CM | POA: Diagnosis not present

## 2023-09-20 DIAGNOSIS — H8121 Vestibular neuronitis, right ear: Secondary | ICD-10-CM

## 2023-09-20 MED ORDER — RIZATRIPTAN BENZOATE 10 MG PO TABS: 10.0000 mg | ORAL_TABLET | ORAL | 5 refills | Status: AC | PRN

## 2023-09-20 NOTE — Patient Instructions (Addendum)
 Continue Vyepti  Take a Reyvow  prior to infusion May take Reyvow  OR rizatriptan .  Limit use of pain relievers to no more than 9 days out of the month to prevent risk of rebound or medication-overuse headache.

## 2023-09-25 ENCOUNTER — Encounter: Payer: Self-pay | Admitting: Neurology

## 2023-09-26 ENCOUNTER — Other Ambulatory Visit: Payer: Self-pay | Admitting: Neurology

## 2023-09-26 MED ORDER — REYVOW 100 MG PO TABS: 1.0000 | ORAL_TABLET | Freq: Every day | ORAL | 5 refills | Status: DC | PRN

## 2023-10-01 ENCOUNTER — Encounter: Payer: Self-pay | Admitting: Family Medicine

## 2023-10-01 ENCOUNTER — Ambulatory Visit (INDEPENDENT_AMBULATORY_CARE_PROVIDER_SITE_OTHER): Admitting: Family Medicine

## 2023-10-01 VITALS — BP 132/84 | HR 93 | Temp 99.2°F | Ht 65.0 in | Wt 200.4 lb

## 2023-10-01 DIAGNOSIS — M62830 Muscle spasm of back: Secondary | ICD-10-CM

## 2023-10-01 NOTE — Progress Notes (Signed)
 Established Patient Office Visit   Subjective  Patient ID: Alyssa Clay, female    DOB: 06-Oct-1965  Age: 58 y.o. MRN: [id number]  Chief Complaint  Patient presents with   Medical Management of Chronic Issues    Right side Neck and should pain, started a week ago, tingling, rate of pain 4 out of 10     Patient is a 58 year old female followed by Dr. Bambi Lever and seen for acute concern.  Patient with right sided neck and shoulder pain, tingling x 1 week.  Pain rated 4/10. Sensation is noted prickly, tingling, and almost painful sensation in the shoulder and neck area. The sensation is very uncomfortable and skin is sensitive, feeling like 'a bunch of needles'. Discomfort worsens with certain movements, particularly during physical therapy exercises, and is present even when sitting still. Makes it difficult to sleep on R side.  No rash or redness has been observed.    She has a history of migraines and sees a migraine specialist for dry needling, although it has been a while since her last visit. She notes an increase in migraines over the weekend, which prevented her from participating in usual activities like volunteering and working out.  Patient followed by neurology, had visit 09/20/2023.  She has been experiencing increased stress, which she attributes to the general state of the world, despite a recent vacation that was relaxing. She is on depression medication, which was recently adjusted, and has started yoga to help manage stress.    Patient Active Problem List   Diagnosis Date Noted   Vertigo 09/05/2022   Acute viral pharyngitis 06/06/2022   Acute left otitis media 02/21/2022   Recurrent infections 11/14/2021   Vaginal yeast infection 11/14/2021   Rash and other nonspecific skin eruption 11/14/2021   Class 2 obesity with body mass index (BMI) of 38.0 to 38.9 in adult 10/19/2021   History of COVID-19 09/12/2021   Chronic migraine without aura without status migrainosus, not  intractable 05/26/2021   Lumbar radiculopathy, right 10/08/2020   SI (sacroiliac) joint dysfunction 09/16/2020   Somatic dysfunction of spine, sacral 09/16/2020   OSA (obstructive sleep apnea) 12/18/2019   FUO (fever of unknown origin) 10/21/2019   S/P bilateral breast reduction 06/19/2019   Back pain 04/15/2019   Neck pain 04/15/2019   History of frequent upper respiratory infection 01/16/2019   Atelectasis 01/16/2019   Severe persistent asthma without complication 01/16/2019   Shortness of breath 01/01/2019   Primary osteoarthritis of both knees 07/10/2018   Hypertension 03/27/2018   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 USE   Enlarged ovary 09/15/2020   noted on ct scan, per pt   FUO (fever of unknown origin) 10/21/2019   GERD (gastroesophageal reflux disease)    Hypertension    PAST hx    Migraine    Past Surgical History:  Procedure Laterality Date   APPENDECTOMY     BREAST REDUCTION SURGERY Bilateral  06/12/2019   Procedure: BREAST REDUCTION WITH LIPOSUCTION;  Surgeon: Thornell Flirt, DO;  Location: [facility];  Service: Plastics;  Laterality: Bilateral;  4 hours, please   CESAREAN SECTION  1994   DILATION AND CURETTAGE OF UTERUS     multiple due to miscarriages   KNEE ARTHROSCOPY Left 2013   LAPAROSCOPIC APPENDECTOMY N/A 12/08/2018   Procedure: APPENDECTOMY LAPAROSCOPIC;  Surgeon: Dareen Ebbing, MD;  Location: [facility];  Service: General;  Laterality: N/A;    REDUCTION MAMMAPLASTY Bilateral 2021   TENNIS ELBOW RELEASE/NIRSCHEL PROCEDURE  2009   WISDOM TOOTH EXTRACTION     Social History   Tobacco Use   Smoking status: Never   Smokeless tobacco: Never  Vaping Use   Vaping status: Never Used  Substance Use Topics   Alcohol use: Yes    Comment: occ 2-3 beers a month    Drug use: Never   Family History  Problem Relation Age of Onset   Depression Mother    Lymphoma Mother 69   Lung cancer Mother    Hypertension Father    Heart attack Father 39   Hypertension Brother    High blood pressure Brother    High Cholesterol Brother    Healthy Daughter    Colon cancer Maternal Aunt 49   Esophageal cancer Neg Hx    Pancreatic cancer Neg Hx    Stomach cancer Neg Hx    Colon polyps Neg Hx    Rectal cancer Neg Hx    Allergies  Allergen Reactions   Penicillins Hives and Other (See Comments)    Yeast infections Yeast infections   Pt wants listed as an allergy    Zonisamide Hives    ROS Negative unless stated above    Objective:      BP 132/84 (BP Location: Left Arm, Patient Position: Sitting, Cuff Size: Normal)   Pulse 93   Temp 99.2 F (37.3 C) (Oral)   Ht 5\' 5"  (1.651 m)   Wt 200 lb 6.4 oz (90.9 kg)   LMP 12/20/2018   SpO2 95%   BMI 33.35 kg/m  BP Readings from Last 3 Encounters:  10/01/23 132/84  09/20/23 130/68  09/10/23 133/80   Wt Readings from Last 3 Encounters:  10/01/23 200 lb 6.4 oz (90.9 kg)  09/20/23 201 lb (91.2 kg)  09/10/23 201 lb 9.6 oz (91.4 kg)      Physical Exam Constitutional:      General: She is not in acute distress.    Appearance: Normal appearance.  HENT:     Head: Normocephalic and atraumatic.     Nose: Nose normal.     Mouth/Throat:     Mouth: Mucous membranes are moist.  Cardiovascular:     Rate and Rhythm: Normal rate and regular rhythm.     Heart sounds: Normal heart sounds. No murmur heard.    No gallop.  Pulmonary:     Effort: Pulmonary effort is normal. No respiratory  distress.     Breath sounds: Normal breath sounds. No wheezing, rhonchi or rales.  Musculoskeletal:     Cervical back: Normal.     Thoracic back: Normal.     Lumbar back: Normal.       Back:     Comments: No TTP of cervical, thoracic, lumbar spine or paraspinal muscles.  TTP of right medial portion of upper trapezius at right shoulder with noticeable knot/increased muscle tension.  Skin:    General: Skin is warm and dry.  Neurological:  Mental Status: She is alert and oriented to person, place, and time.        07/11/2023   11:54 AM 06/12/2023    1:22 PM 04/05/2023   12:59 PM  Depression screen PHQ 2/9  Decreased Interest 2 0 0  Down, Depressed, Hopeless 3 0 0  PHQ - 2 Score 5 0 0  Altered sleeping 2  0  Tired, decreased energy 2  0  Change in appetite 1  0  Feeling bad or failure about yourself  0  0  Trouble concentrating 0  0  Moving slowly or fidgety/restless 1  0  Suicidal thoughts 0  0  PHQ-9 Score 11  0      04/05/2023   12:59 PM 09/05/2022    4:21 PM  GAD 7 : Generalized Anxiety Score  Nervous, Anxious, on Edge 0 0  Control/stop worrying 0 0  Worry too much - different things 0 0  Trouble relaxing 0 0  Restless 0 0  Easily annoyed or irritable 0 0  Afraid - awful might happen 0 0  Total GAD 7 Score 0 0  Anxiety Difficulty  Not difficult at all     No results found for any visits on 10/01/23.    Assessment & Plan:   Spasm of right trapezius muscle  Pt with increased muscle tension of R trapezius possibly 2/2 increased stress or migraines.  Continue supportive care including Tylenol  or NSAIDs, topical analgesics, heat, ice, massage, stretching, Salonpas.  Continue PT and dry needling.  Monitor for development of rash as shingles could be causing symptoms.  Follow-up with PCP as needed.  Return if symptoms worsen or fail to improve.   Viola Greulich, MD

## 2023-10-07 NOTE — Progress Notes (Unsigned)
 Follow Up Note  RE: Alyssa Clay MRN: [id number] DOB: 09/18/1965 Date of Office Visit: 10/08/2023  Referring provider: Aida House, MD Primary care provider: Aida House, MD  Chief Complaint: No chief complaint on file.  History of Present Illness: I had the pleasure of seeing Alyssa Clay for a follow up visit at the [facility] on 10/08/2023. She is a 58 y.o. female, who is being followed for asthma on tezspire  atelectasis, chronic rhinitis, GERD, recurrent infections. Her previous allergy office visit was on 06/04/2023 with Dr. Burdette Carolin. Today is a regular follow up visit.  Discussed the use of AI scribe software for clinical note transcription with the patient, who gave verbal consent to proceed.  History of Present Illness            2025 labs: Your blood count, immunoglobulin levels were normal which is great. You also have good protection against diptheria and tetanus. Your pneumococcal titers were over 50% protective against certain strains which is adequate.   When is the last time you had a pneumonia shot?   Keep track of infections and antibiotics use.  Assessment and Plan: Alyssa Clay is a 58 y.o. female with: Severe persistent asthma without complication Past history - Spiriva  caused coughing. Normal alpha-1 level. Seen by pulmonary.Started Tezspire  on 03/31/21. Interim history - Reports good control prior to recent illnesses. Has not been using Breo regularly due to recent illnesses. Today's spirometry showed some restriction.  Daily controller medications: take Breo 200mcg 1 puff once a day and rinse mouth after each use. Do the flutter valve and incentive spirometer. Continue Tezspire  injection every 4 weeks at home.  During respiratory infections/asthma flares:  Start Budesonide  0.5mg  nebulizer twice a day for 1-2 weeks until your breathing symptoms return to baseline.  Pretreat with albuterol  2 puffs or albuterol  nebulizer.   If you need to use your albuterol  nebulizer machine back to back within 15-30 minutes with no relief then please go to the ER/urgent care for further evaluation.  May use albuterol  rescue inhaler 2 puffs or nebulizer every 4 to 6 hours as needed for shortness of breath, chest tightness, coughing, and wheezing. May use albuterol  rescue inhaler 2 puffs 5 to 15 minutes prior to strenuous physical activities. Monitor frequency of use - if you need to use it more than twice per week on a consistent basis let us  know.    Atelectasis Past history - 9/9/202 CT chest showed: 1. Prominent elevation of the right hemidiaphragm with associated right lung base atelectasis including complete right middle lobe atelectasis. Right hemidiaphragm paralysis not excluded. Saw pulm.  Encouraged using flutter valve and incentive spirometer.    Chronic rhinitis Past history - 2019 skin testing negative. Interim history - stable. Use Flonase  (fluticasone ) nasal spray 1-2 sprays per nostril once a day as needed for nasal congestion.  Nasal saline spray (i.e., Simply Saline) or nasal saline lavage (i.e., NeilMed) is recommended as needed and prior to medicated nasal sprays.   Gastroesophageal reflux disease, unspecified whether esophagitis present Continue reflux lifestyle modifications and diet.  May use over the counter medication as needed.    Recurrent infections Recently had double ear infection and norovirus.  Keep track of infections and antibiotics use. Get bloodwork in about 2 weeks to look at your immune system. Assessment and Plan              No follow-ups on file.  No orders of the defined types were placed  in this encounter.  Lab Orders  No laboratory test(s) ordered today    Diagnostics: Spirometry:  Tracings reviewed. Her effort: {Blank single:19197::Good reproducible efforts.,It was hard to get consistent efforts and there is a question as to whether this reflects a maximal  maneuver.,Poor effort, data can not be interpreted.} FVC: ***L FEV1: ***L, ***% predicted FEV1/FVC ratio: ***% Interpretation: {Blank single:19197::Spirometry consistent with mild obstructive disease,Spirometry consistent with moderate obstructive disease,Spirometry consistent with severe obstructive disease,Spirometry consistent with possible restrictive disease,Spirometry consistent with mixed obstructive and restrictive disease,Spirometry uninterpretable due to technique,Spirometry consistent with normal pattern,No overt abnormalities noted given today's efforts}.  Please see scanned spirometry results for details.  Skin Testing: {Blank single:19197::Select foods,Environmental allergy panel,Environmental allergy panel and select foods,Food allergy panel,None,Deferred due to recent antihistamines use}. *** Results discussed with patient/family.   Medication List:  Current Outpatient Medications  Medication Sig Dispense Refill   albuterol  (PROVENTIL ) (2.5 MG/3ML) 0.083% nebulizer solution Take 3 mLs (2.5 mg total) by nebulization every 4 (four) hours as needed for wheezing or shortness of breath (coughing fits). 75 mL 1   albuterol  (VENTOLIN  HFA) 108 (90 Base) MCG/ACT inhaler Inhale 2 puffs into the lungs every 4 (four) hours as needed for wheezing or shortness of breath (coughing fits). 18 g 1   azelastine  (ASTELIN ) 0.1 % nasal spray one spray by Both Nostrils route 2 (two) times daily. Use in each nostril as directed     BREO ELLIPTA  200-25 MCG/ACT AEPB INHALE 1 INHALATION BY MOUTH  INTO THE LUNGS DAILY RINSE MOUTH AFTER EACH USE 180 each 3   budesonide  (PULMICORT ) 0.5 MG/2ML nebulizer solution Take 2 mLs (0.5 mg total) by nebulization in the morning and at bedtime. Take for 1-2 weeks during asthma flare. 120 mL 2   buPROPion  (WELLBUTRIN  XL) 300 MG 24 hr tablet Take 1 tablet (300 mg total) by mouth daily. 90 tablet 1   DULoxetine  (CYMBALTA ) 60 MG capsule TAKE 1  CAPSULE BY MOUTH DAILY 90 capsule 3   Estradiol  (YUVAFEM ) 10 MCG TABS vaginal tablet Place 1 tablet (10 mcg total) vaginally daily. 8 tablet 0   Lasmiditan  Succinate (REYVOW ) 100 MG TABS Take 1 tablet (100 mg total) by mouth daily as needed. 4 tablet 5   Multiple Vitamin (MULTIVITAMIN WITH MINERALS) TABS tablet Take 1 tablet by mouth daily.     ondansetron  (ZOFRAN ) 4 MG tablet Take 1 tablet (4 mg total) by mouth every 8 (eight) hours as needed for nausea or vomiting. 30 tablet 1   Respiratory Therapy Supplies (FLUTTER) DEVI Use 3 times daily as directed 1 each 0   rizatriptan  (MAXALT ) 10 MG tablet Take 1 tablet (10 mg total) by mouth as needed for migraine. May repeat in 2 hours if needed.  Maximum 2 tablets in 24 hours. 10 tablet 5   Semaglutide -Weight Management (WEGOVY ) 2.4 MG/0.75ML SOAJ Inject 2.4 mg into the skin once a week. 6 mL 3   Spacer/Aero-Holding Chambers (AEROCHAMBER MV) inhaler Use as instructed 1 each 1   Tezepelumab -ekko (TEZSPIRE ) 210 MG/1. SOAJ Inject 210 mg into the skin every 28 (twenty-eight) days. 1.91 mL 11   traZODone  (DESYREL ) 100 MG tablet TAKE 1 TO 2 TABLETS BY MOUTH AT  BEDTIME AS NEEDED FOR SLEEP 90 tablet 7   tretinoin (RETIN-A) 0.05 % cream Apply topically.     valACYclovir  (VALTREX ) 1000 MG tablet Take 1 tablet (1,000 mg total) by mouth 2 (two) times daily. 14 tablet 5   Current Facility-Administered Medications  Medication Dose Route Frequency Provider  Last Rate Last Admin   tezepelumab -ekko (TEZSPIRE ) 210 MG/1. syringe 210 mg  210 mg Subcutaneous Q28 days Trudy Fusi, DO   210 mg at 08/19/21 1138   Allergies: Allergies  Allergen Reactions   Penicillins Hives and Other (See Comments)    Yeast infections Yeast infections   Pt wants listed as an allergy    Zonisamide Hives   I reviewed her past medical history, social history, family history, and environmental history and no significant changes have been reported from her previous visit.  Review  of Systems  Constitutional:  Negative for appetite change, chills, fever and unexpected weight change.  HENT:  Negative for congestion and rhinorrhea.   Eyes:  Negative for itching.  Respiratory:  Positive for cough. Negative for chest tightness, shortness of breath and wheezing.   Gastrointestinal:  Positive for constipation. Negative for abdominal pain.  Skin:  Negative for rash.  Allergic/Immunologic: Positive for environmental allergies.    Objective: LMP 12/20/2018  There is no height or weight on file to calculate BMI. Physical Exam Vitals and nursing note reviewed.  Constitutional:      Appearance: Normal appearance. She is well-developed.  HENT:     Head: Normocephalic and atraumatic.     Right Ear: External ear normal.     Left Ear: External ear normal.     Ears:     Comments: Slight fluid behind both tm b/l but no bulging or erythema. Scab on right ear canal.    Nose: Nose normal.     Mouth/Throat:     Mouth: Mucous membranes are moist.     Pharynx: Oropharynx is clear.   Eyes:     Conjunctiva/sclera: Conjunctivae normal.    Cardiovascular:     Rate and Rhythm: Normal rate and regular rhythm.     Heart sounds: Normal heart sounds. No murmur heard. Pulmonary:     Effort: Pulmonary effort is normal.     Breath sounds: Normal breath sounds. No wheezing, rhonchi or rales.   Musculoskeletal:     Cervical back: Neck supple.   Skin:    General: Skin is warm.     Findings: No rash.   Neurological:     Mental Status: She is alert and oriented to person, place, and time.   Psychiatric:        Behavior: Behavior normal.    Previous notes and tests were reviewed. The plan was reviewed with the patient/family, and all questions/concerned were addressed.  It was my pleasure to see Alyssa Clay today and participate in her care. Please feel free to contact me with any questions or concerns.  Sincerely,  Eudelia Hero, DO Allergy & Immunology  [facility]  [place] office: [phone] [facility] office: [phone]

## 2023-10-08 ENCOUNTER — Encounter: Payer: Self-pay | Admitting: Allergy

## 2023-10-08 ENCOUNTER — Ambulatory Visit (INDEPENDENT_AMBULATORY_CARE_PROVIDER_SITE_OTHER): Payer: 59 | Admitting: Allergy

## 2023-10-08 ENCOUNTER — Other Ambulatory Visit: Payer: Self-pay

## 2023-10-08 VITALS — BP 130/84 | HR 98 | Temp 98.3°F | Resp 19 | Ht 64.0 in | Wt 199.7 lb

## 2023-10-08 DIAGNOSIS — J4550 Severe persistent asthma, uncomplicated: Secondary | ICD-10-CM

## 2023-10-08 DIAGNOSIS — B999 Unspecified infectious disease: Secondary | ICD-10-CM | POA: Diagnosis not present

## 2023-10-08 DIAGNOSIS — J310 Chronic rhinitis: Secondary | ICD-10-CM | POA: Diagnosis not present

## 2023-10-08 DIAGNOSIS — K219 Gastro-esophageal reflux disease without esophagitis: Secondary | ICD-10-CM

## 2023-10-08 DIAGNOSIS — J9811 Atelectasis: Secondary | ICD-10-CM | POA: Diagnosis not present

## 2023-10-08 NOTE — Patient Instructions (Addendum)
 Asthma/atelectasis Daily controller medications: continue Breo 200mcg 1 puff once a day and rinse mouth after each use. Do the flutter valve and incentive spirometer. Continue Tezspire  injection every 4 weeks at home.  During respiratory infections/asthma flares:  Start Budesonide  0.5mg  nebulizer twice a day for 1-2 weeks until your breathing symptoms return to baseline.  Pretreat with albuterol  2 puffs or albuterol  nebulizer.  If you need to use your albuterol  nebulizer machine back to back within 15-30 minutes with no relief then please go to the ER/urgent care for further evaluation.  May use albuterol  rescue inhaler 2 puffs or nebulizer every 4 to 6 hours as needed for shortness of breath, chest tightness, coughing, and wheezing. May use albuterol  rescue inhaler 2 puffs 5 to 15 minutes prior to strenuous physical activities. Monitor frequency of use - if you need to use it more than twice per week on a consistent basis let us  know.  Asthma control goals:  Full participation in all desired activities (may need albuterol  before activity) Albuterol  use two times or less a week on average (not counting use with activity) Cough interfering with sleep two times or less a month Oral steroids no more than once a year No hospitalizations.  Infections Keep track of infections and antibiotics use.  Chronic rhinitis Nasal saline spray (i.e., Simply Saline) or nasal saline lavage (i.e., NeilMed) is recommended as needed and prior to medicated nasal sprays.   Gastroesophageal reflux disease Continue reflux lifestyle modifications and diet.  May use over the counter medication as needed.   Follow up in 6 months or sooner if needed.  Get flu shot in the fall.

## 2023-10-11 ENCOUNTER — Emergency Department (HOSPITAL_BASED_OUTPATIENT_CLINIC_OR_DEPARTMENT_OTHER)
Admission: EM | Admit: 2023-10-11 | Discharge: 2023-10-11 | Disposition: A | Discharge status: Home / Self Care | Attending: Emergency Medicine | Admitting: Emergency Medicine

## 2023-10-11 ENCOUNTER — Other Ambulatory Visit: Payer: Self-pay

## 2023-10-11 ENCOUNTER — Encounter (HOSPITAL_BASED_OUTPATIENT_CLINIC_OR_DEPARTMENT_OTHER): Payer: Self-pay

## 2023-10-11 DIAGNOSIS — G43809 Other migraine, not intractable, without status migrainosus: Secondary | ICD-10-CM | POA: Diagnosis present

## 2023-10-11 LAB — SEDIMENTATION RATE: Sed Rate: 18 mm/h (ref 0–22)

## 2023-10-11 MED ORDER — DEXAMETHASONE SODIUM PHOSPHATE 10 MG/ML IJ SOLN
10.0000 mg | Freq: Once | INTRAMUSCULAR | Status: DC
  Filled 2023-10-11: qty 1

## 2023-10-11 MED ORDER — METOCLOPRAMIDE HCL 5 MG/ML IJ SOLN
10.0000 mg | Freq: Once | INTRAMUSCULAR | Status: DC
  Filled 2023-10-11: qty 2

## 2023-10-11 MED ORDER — SODIUM CHLORIDE 0.9 % IV SOLN: INTRAVENOUS | Status: DC

## 2023-10-11 MED ORDER — SODIUM CHLORIDE 0.9 % IV BOLUS
1000.0000 mL | Freq: Once | INTRAVENOUS | Status: AC
  Administered 2023-10-11: 1000 mL via INTRAVENOUS

## 2023-10-11 MED ORDER — METOCLOPRAMIDE HCL 5 MG/ML IJ SOLN
10.0000 mg | Freq: Once | INTRAMUSCULAR | Status: AC
  Administered 2023-10-11: 10 mg via INTRAVENOUS

## 2023-10-11 MED ORDER — KETOROLAC TROMETHAMINE 15 MG/ML IJ SOLN
15.0000 mg | Freq: Once | INTRAMUSCULAR | Status: AC
  Administered 2023-10-11: 15 mg via INTRAVENOUS
  Filled 2023-10-11: qty 1

## 2023-10-11 MED ORDER — DEXAMETHASONE SODIUM PHOSPHATE 10 MG/ML IJ SOLN: 10.0000 mg | Freq: Once | INTRAMUSCULAR | Status: DC

## 2023-10-11 MED ORDER — DEXAMETHASONE SODIUM PHOSPHATE 10 MG/ML IJ SOLN
10.0000 mg | Freq: Once | INTRAMUSCULAR | Status: AC
  Administered 2023-10-11: 10 mg via INTRAVENOUS

## 2023-10-11 MED ORDER — KETOROLAC TROMETHAMINE 30 MG/ML IJ SOLN
30.0000 mg | Freq: Once | INTRAMUSCULAR | Status: DC
  Filled 2023-10-11: qty 1

## 2023-10-11 MED ORDER — KETOROLAC TROMETHAMINE 30 MG/ML IJ SOLN: 30.0000 mg | Freq: Once | INTRAMUSCULAR | Status: DC

## 2023-10-11 NOTE — ED Notes (Signed)
Reviewed discharge instructions and home care with pt. Pt verbalized understanding and had no further questions. Pt exited ED without complications.

## 2023-10-11 NOTE — ED Triage Notes (Signed)
 Pt reports hx of migraines. Pt reports shooting pain and R jaw numbness starting yesterday at 1700 hours. Pt reports similar pain x1 week ago as well.

## 2023-10-11 NOTE — ED Provider Notes (Signed)
 [place] EMERGENCY DEPARTMENT AT [facility] Provider Note   CSN: [id number] Arrival date & time: 10/11/23  1728     Patient presents with: Migraine   Alyssa Clay is a 58 y.o. female.   58 year old female with history of migraines presents right-sided headache described as shooting pain down to the right eye with some paresthesias.  States that her symptoms began yesterday around 5:00.  Similar pain about a week ago.  Patient went to physical therapist and was could have dry needling today but due to the pain to palpation in her right temporal scalp they were not able to do this.  Patient denies any visual changes.  No focal weakness.  No ataxia.  No fever or neck discomfort.  Slight photophobia.  Took Maxalt  yesterday without relief       Prior to Admission medications   Medication Sig Start Date End Date Taking? Authorizing Provider  albuterol  (PROVENTIL ) (2.5 MG/3ML) 0.083% nebulizer solution Take 3 mLs (2.5 mg total) by nebulization every 4 (four) hours as needed for wheezing or shortness of breath (coughing fits). 12/18/22   Trudy Fusi, DO  albuterol  (VENTOLIN  HFA) 108 (90 Base) MCG/ACT inhaler Inhale 2 puffs into the lungs every 4 (four) hours as needed for wheezing or shortness of breath (coughing fits). 12/18/22   Trudy Fusi, DO  azelastine  (ASTELIN ) 0.1 % nasal spray one spray by Both Nostrils route 2 (two) times daily. Use in each nostril as directed 04/27/23   [provider]  BREO ELLIPTA  200-25 MCG/ACT AEPB INHALE 1 INHALATION BY MOUTH  INTO THE LUNGS DAILY RINSE MOUTH AFTER EACH USE 05/16/23   Trudy Fusi, DO  budesonide  (PULMICORT ) 0.5 MG/2ML nebulizer solution Take 2 mLs (0.5 mg total) by nebulization in the morning and at bedtime. Take for 1-2 weeks during asthma flare. 12/18/22   Trudy Fusi, DO  buPROPion  (WELLBUTRIN  XL) 300 MG 24 hr tablet Take 1 tablet (300 mg total) by mouth daily. 07/11/23   Aida House, MD  DULoxetine  (CYMBALTA ) 60 MG  capsule TAKE 1 CAPSULE BY MOUTH DAILY 07/18/23   Aida House, MD  Estradiol  (YUVAFEM ) 10 MCG TABS vaginal tablet Place 1 tablet (10 mcg total) vaginally daily. 08/30/20   Koberlein, Junell C, MD  Lasmiditan  Succinate (REYVOW ) 100 MG TABS Take 1 tablet (100 mg total) by mouth daily as needed. 09/26/23   Merriam Abbey, DO  Multiple Vitamin (MULTIVITAMIN WITH MINERALS) TABS tablet Take 1 tablet by mouth daily.    [provider]  ondansetron  (ZOFRAN ) 4 MG tablet Take 1 tablet (4 mg total) by mouth every 8 (eight) hours as needed for nausea or vomiting. 08/30/23   Aida House, MD  Respiratory Therapy Supplies (FLUTTER) DEVI Use 3 times daily as directed 01/22/19   Mannam, Praveen, MD  rizatriptan  (MAXALT ) 10 MG tablet Take 1 tablet (10 mg total) by mouth as needed for migraine. May repeat in 2 hours if needed.  Maximum 2 tablets in 24 hours. 09/20/23   Merriam Abbey, DO  Semaglutide -Weight Management (WEGOVY ) 2.4 MG/0.75ML SOAJ Inject 2.4 mg into the skin once a week. 04/05/23   Aida House, MD  Spacer/Aero-Holding Chambers (AEROCHAMBER MV) inhaler Use as instructed 04/28/22   Ethlyn Herd, MD  Tezepelumab -ekko (TEZSPIRE ) 210 MG/1. SOAJ Inject 210 mg into the skin every 28 (twenty-eight) days. 01/03/23   Trudy Fusi, DO  traZODone  (DESYREL ) 100 MG tablet TAKE 1 TO 2 TABLETS BY MOUTH AT  BEDTIME AS NEEDED FOR SLEEP 03/05/23   Aida House, MD  tretinoin (RETIN-A) 0.05 % cream Apply topically. 03/30/20   [provider]  valACYclovir  (VALTREX ) 1000 MG tablet Take 1 tablet (1,000 mg total) by mouth 2 (two) times daily. 06/22/22   Aida House, MD    Allergies: Penicillins and Zonisamide    Review of Systems  All other systems reviewed and are negative.   Updated Vital Signs BP (!) 169/81 (BP Location: Right Arm)   Pulse 96   Temp 98.6 F (37 C) (Oral)   Resp 16   Ht 1.651 m (5' 5)   Wt 90.7 kg   LMP 12/20/2018   SpO2 95%   BMI 33.28 kg/m    Physical Exam Vitals and nursing note reviewed.  Constitutional:      General: She is not in acute distress.    Appearance: Normal appearance. She is well-developed. She is not toxic-appearing.  HENT:     Head: Normocephalic and atraumatic.    Eyes:     General: Lids are normal.     Conjunctiva/sclera: Conjunctivae normal.     Pupils: Pupils are equal, round, and reactive to light.   Neck:     Thyroid: No thyroid mass.     Trachea: No tracheal deviation.   Cardiovascular:     Rate and Rhythm: Normal rate and regular rhythm.     Heart sounds: Normal heart sounds. No murmur heard.    No gallop.  Pulmonary:     Effort: Pulmonary effort is normal. No respiratory distress.     Breath sounds: Normal breath sounds. No stridor. No decreased breath sounds, wheezing, rhonchi or rales.  Abdominal:     General: There is no distension.     Palpations: Abdomen is soft.     Tenderness: There is no abdominal tenderness. There is no rebound.   Musculoskeletal:        General: No tenderness. Normal range of motion.     Cervical back: Normal range of motion and neck supple.   Skin:    General: Skin is warm and dry.     Findings: No abrasion or rash.   Neurological:     General: No focal deficit present.     Mental Status: She is alert and oriented to person, place, and time. Mental status is at baseline.     GCS: GCS eye subscore is 4. GCS verbal subscore is 5. GCS motor subscore is 6.     Cranial Nerves: No cranial nerve deficit.     Sensory: No sensory deficit.     Motor: Motor function is intact.   Psychiatric:        Attention and Perception: Attention normal.        Speech: Speech normal.        Behavior: Behavior normal.     (all labs ordered are listed, but only abnormal results are displayed) Labs Reviewed  SEDIMENTATION RATE    EKG: None  Radiology: No results found.   Procedures   Medications Ordered in the ED  metoCLOPramide  (REGLAN ) injection 10 mg  (has no administration in time range)  ketorolac  (TORADOL ) 30 MG/ML injection 30 mg (has no administration in time range)  dexamethasone  (DECADRON ) injection 10 mg (has no administration in time range)  Medical Decision Making Amount and/or Complexity of Data Reviewed Labs: ordered.  Risk Prescription drug management.   Patient is a treated with migraine cocktail as well has IV fluids and she feels better.  Patient had right temporal pain and consider possibility of temporal arteritis.  Sed rate was negative.  Reexam performed and she is nontender at this time.  Suspect this is part of migraine.  Will discharge     Final diagnoses:  None    ED Discharge Orders     None          Lind Repine, MD 10/11/23 2145

## 2023-10-12 ENCOUNTER — Telehealth: Payer: Self-pay

## 2023-10-12 NOTE — Telephone Encounter (Signed)
 Telephone call from [place] From PT, Message left today at 10:11 am.  Wanted to touch base in regards to the patient and her Migraines.    Patient seen in ED today as well

## 2023-10-18 ENCOUNTER — Ambulatory Visit (INDEPENDENT_AMBULATORY_CARE_PROVIDER_SITE_OTHER): Admitting: Family Medicine

## 2023-10-18 ENCOUNTER — Encounter: Payer: Self-pay | Admitting: Family Medicine

## 2023-10-18 VITALS — BP 112/68 | HR 90 | Temp 98.7°F | Ht 65.0 in | Wt 200.7 lb

## 2023-10-18 DIAGNOSIS — Z6838 Body mass index (BMI) 38.0-38.9, adult: Secondary | ICD-10-CM

## 2023-10-18 DIAGNOSIS — E66812 Obesity, class 2: Secondary | ICD-10-CM | POA: Diagnosis not present

## 2023-10-18 NOTE — Progress Notes (Signed)
 Established Patient Office Visit  Subjective   Patient ID: Alyssa Clay, female    DOB: 04/03/1966  Age: 58 y.o. MRN: [id number]  Chief Complaint  Patient presents with   Medical Management of Chronic Issues    Pt is here for follow up. States that she is still taking th wegovy , however the pharmach has started sending her the zepbound  instead. Pt states she hasn't started taking it yet. We discussed the possible benefit of switching and she is agreeable to make the switch.   Pt reports her depression symptoms continue to be well controlled, no relapses since march when we changed her medication.   Total weight loss to date: 38 pounds since restarting wegovy     Current Outpatient Medications  Medication Instructions   albuterol  (PROVENTIL ) 2.5 mg, Nebulization, Every 4 hours PRN   albuterol  (VENTOLIN  HFA) 108 (90 Base) MCG/ACT inhaler 2 puffs, Inhalation, Every 4 hours PRN   azelastine  (ASTELIN ) 0.1 % nasal spray one spray by Both Nostrils route 2 (two) times daily. Use in each nostril as directed   BREO ELLIPTA  200-25 MCG/ACT AEPB INHALE 1 INHALATION BY MOUTH  INTO THE LUNGS DAILY RINSE MOUTH AFTER EACH USE   budesonide  (PULMICORT ) 0.5 mg, Nebulization, 2 times daily, Take for 1-2 weeks during asthma flare.   buPROPion  (WELLBUTRIN  XL) 300 mg, Oral, Daily   DULoxetine  (CYMBALTA ) 60 mg, Oral, Daily   Estradiol  (YUVAFEM ) 10 mcg, Vaginal, Daily   Multiple Vitamin (MULTIVITAMIN WITH MINERALS) TABS tablet 1 tablet, Daily   ondansetron  (ZOFRAN ) 4 mg, Oral, Every 8 hours PRN   Respiratory Therapy Supplies (FLUTTER) DEVI Use 3 times daily as directed   Reyvow  100 mg, Oral, Daily PRN   rizatriptan  (MAXALT ) 10 mg, Oral, As needed, May repeat in 2 hours if needed.  Maximum 2 tablets in 24 hours.   Spacer/Aero-Holding Chambers (AEROCHAMBER MV) inhaler Use as instructed   Tezspire  210 mg, Subcutaneous, Every 28 days   traZODone  (DESYREL ) 100-200 mg, Oral, At bedtime PRN, for sleep    tretinoin (RETIN-A) 0.05 % cream Apply topically.   valACYclovir  (VALTREX ) 1,000 mg, Oral, 2 times daily   Zepbound  15 mg, Weekly    Patient Active Problem List   Diagnosis Date Noted   Vertigo 09/05/2022   Acute viral pharyngitis 06/06/2022   Acute left otitis media 02/21/2022   Recurrent infections 11/14/2021   Vaginal yeast infection 11/14/2021   Rash and other nonspecific skin eruption 11/14/2021   Class 2 obesity with body mass index (BMI) of 38.0 to 38.9 in adult 10/19/2021   History of COVID-19 09/12/2021   Chronic migraine without aura without status migrainosus, not intractable 05/26/2021   Lumbar radiculopathy, right 10/08/2020   SI (sacroiliac) joint dysfunction 09/16/2020   Somatic dysfunction of spine, sacral 09/16/2020   OSA (obstructive sleep apnea) 12/18/2019   FUO (fever of unknown origin) 10/21/2019   S/P bilateral breast reduction 06/19/2019   Back pain 04/15/2019   Neck pain 04/15/2019   History of frequent upper respiratory infection 01/16/2019   Atelectasis 01/16/2019   Severe persistent asthma without complication 01/16/2019   Shortness of breath 01/01/2019   Primary osteoarthritis of both knees 07/10/2018   Hypertension 03/27/2018   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     Objective:     BP 112/68   Pulse 90   Temp 98.7 F (37.1 C) (Oral)   Ht 5' 5 (1.651 m)   Wt 200 lb 11.2 oz (91 kg)   LMP 12/20/2018   SpO2 98%   BMI 33.40 kg/m     Physical Exam Vitals reviewed.  Constitutional:      Appearance: Normal appearance. She is well-groomed and normal weight.   Eyes:     Conjunctiva/sclera: Conjunctivae  normal.   Neck:     Thyroid: No thyromegaly.   Cardiovascular:     Rate and Rhythm: Normal rate and regular rhythm.     Pulses: Normal pulses.     Heart sounds: S1 normal and S2 normal.  Pulmonary:     Effort: Pulmonary effort is normal.     Breath sounds: Normal breath sounds and air entry.  Abdominal:     General: Bowel sounds are normal.   Musculoskeletal:     Right lower leg: No edema.     Left lower leg: No edema.   Neurological:     Mental Status: She is alert and oriented to person, place, and time. Mental status is at baseline.     Gait: Gait is intact.   Psychiatric:        Mood and Affect: Mood and affect normal.        Speech: Speech normal.        Behavior: Behavior normal.        Judgment: Judgment normal.      No results found for any visits on 10/18/23.     The 10-year ASCVD risk score (Arnett DK, et al., 2019) is: 1.6%    Assessment & Plan:  Class 2 severe obesity with serious comorbidity and body mass index (BMI) of 38.0 to 38.9 in adult, unspecified obesity type [facility]) Assessment & Plan: Patient will be switching to Zepbound  from the wegovy  now that she has received the medication from the pharmacy. Will see her back in 6 months for follow up.     Spent 20 minutes with patient today providing dietary counseling, following up on depression symptoms which are well controlled.  Return in about 6 months (around 04/18/2024) for weight loss.    Heron CHRISTELLA Sharper, MD

## 2023-10-18 NOTE — Assessment & Plan Note (Signed)
 Patient will be switching to Zepbound  from the wegovy  now that she has received the medication from the pharmacy. Will see her back in 6 months for follow up.

## 2023-10-31 ENCOUNTER — Encounter: Payer: Self-pay | Admitting: Internal Medicine

## 2023-10-31 ENCOUNTER — Ambulatory Visit: Admitting: Internal Medicine

## 2023-10-31 VITALS — BP 108/68 | HR 100 | Temp 98.5°F | Ht 65.0 in | Wt 196.8 lb

## 2023-10-31 DIAGNOSIS — J069 Acute upper respiratory infection, unspecified: Secondary | ICD-10-CM | POA: Diagnosis not present

## 2023-10-31 DIAGNOSIS — H9201 Otalgia, right ear: Secondary | ICD-10-CM

## 2023-10-31 NOTE — Progress Notes (Signed)
 Established Patient Office Visit     CC/Reason for Visit: Right ear pain  HPI: Alyssa Clay is a 58 y.o. female who is coming in today for the above mentioned reasons.  For the past 3 days has been experiencing right ear pressure and pain.  She has also had other URI symptoms such as sinus pressure and some postnasal drip.  2 nights ago she had a low-grade temperature of 100.9.  Has been taking over-the-counter anti-inflammatories with mild relief.   Past Medical/Surgical History: Past Medical History:  Diagnosis Date   Allergy    Anemia    Asthma    Constipation    issues x the past month -? 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 USE   Enlarged ovary 09/15/2020   noted on ct scan, per pt   FUO (fever of unknown origin) 10/21/2019   GERD (gastroesophageal reflux disease)    Hypertension    PAST hx    Migraine     Past Surgical History:  Procedure Laterality Date   APPENDECTOMY     BREAST REDUCTION SURGERY Bilateral 06/12/2019   Procedure: BREAST REDUCTION WITH LIPOSUCTION;  Surgeon: Lowery Estefana RAMAN, DO;  Location: [facility];  Service: Plastics;  Laterality: Bilateral;  4 hours, please   CESAREAN SECTION  1994   DILATION AND CURETTAGE OF UTERUS     multiple due to miscarriages   KNEE ARTHROSCOPY Left 2013   LAPAROSCOPIC APPENDECTOMY N/A 12/08/2018   Procedure: APPENDECTOMY LAPAROSCOPIC;  Surgeon: Belinda Cough, MD;  Location: [facility];  Service: General;  Laterality: N/A;   REDUCTION MAMMAPLASTY Bilateral 2021   TENNIS ELBOW RELEASE/NIRSCHEL PROCEDURE  2009   WISDOM TOOTH EXTRACTION      Social History:  reports that she has never smoked. She has never used smokeless tobacco. She reports current alcohol use. She reports that she does not use drugs.  Allergies: Allergies  Allergen Reactions   Penicillins Hives and Other (See Comments)    Yeast infections Yeast  infections   Pt wants listed as an allergy    Zonisamide Hives    Family History:  Family History  Problem Relation Age of Onset   Depression Mother    Lymphoma Mother 32   Lung cancer Mother    Hypertension Father    Heart attack Father 62   Hypertension Brother    High blood pressure Brother    High Cholesterol Brother    Healthy Daughter    Colon cancer Maternal Aunt 53   Esophageal cancer Neg Hx    Pancreatic cancer Neg Hx    Stomach cancer Neg Hx    Colon polyps Neg Hx    Rectal cancer Neg Hx      Current Outpatient Medications:    albuterol  (PROVENTIL ) (2.5 MG/3ML) 0.083% nebulizer solution, Take 3 mLs (2.5 mg total) by nebulization every 4 (four) hours as needed for wheezing or shortness of breath (coughing fits)., Disp: 75 mL, Rfl: 1   albuterol  (VENTOLIN  HFA) 108 (90 Base) MCG/ACT inhaler, Inhale 2 puffs into the lungs every 4 (four) hours as needed for wheezing or shortness of breath (coughing fits)., Disp: 18 g, Rfl: 1   azelastine  (ASTELIN ) 0.1 % nasal spray, one spray by Both Nostrils route 2 (two) times daily. Use in each nostril as directed, Disp: , Rfl:    BREO ELLIPTA  200-25 MCG/ACT AEPB,  INHALE 1 INHALATION BY MOUTH  INTO THE LUNGS DAILY RINSE MOUTH AFTER EACH USE, Disp: 180 each, Rfl: 3   budesonide  (PULMICORT ) 0.5 MG/2ML nebulizer solution, Take 2 mLs (0.5 mg total) by nebulization in the morning and at bedtime. Take for 1-2 weeks during asthma flare., Disp: 120 mL, Rfl: 2   buPROPion  (WELLBUTRIN  XL) 300 MG 24 hr tablet, Take 1 tablet (300 mg total) by mouth daily., Disp: 90 tablet, Rfl: 1   DULoxetine  (CYMBALTA ) 60 MG capsule, TAKE 1 CAPSULE BY MOUTH DAILY, Disp: 90 capsule, Rfl: 3   Estradiol  (YUVAFEM ) 10 MCG TABS vaginal tablet, Place 1 tablet (10 mcg total) vaginally daily., Disp: 8 tablet, Rfl: 0   Lasmiditan  Succinate (REYVOW ) 100 MG TABS, Take 1 tablet (100 mg total) by mouth daily as needed., Disp: 4 tablet, Rfl: 5   Multiple Vitamin (MULTIVITAMIN  WITH MINERALS) TABS tablet, Take 1 tablet by mouth daily., Disp: , Rfl:    ondansetron  (ZOFRAN ) 4 MG tablet, Take 1 tablet (4 mg total) by mouth every 8 (eight) hours as needed for nausea or vomiting., Disp: 30 tablet, Rfl: 1   Respiratory Therapy Supplies (FLUTTER) DEVI, Use 3 times daily as directed, Disp: 1 each, Rfl: 0   rizatriptan  (MAXALT ) 10 MG tablet, Take 1 tablet (10 mg total) by mouth as needed for migraine. May repeat in 2 hours if needed.  Maximum 2 tablets in 24 hours., Disp: 10 tablet, Rfl: 5   Spacer/Aero-Holding Chambers (AEROCHAMBER MV) inhaler, Use as instructed, Disp: 1 each, Rfl: 1   Tezepelumab -ekko (TEZSPIRE ) 210 MG/1. SOAJ, Inject 210 mg into the skin every 28 (twenty-eight) days., Disp: 1.91 mL, Rfl: 11   traZODone  (DESYREL ) 100 MG tablet, TAKE 1 TO 2 TABLETS BY MOUTH AT  BEDTIME AS NEEDED FOR SLEEP, Disp: 90 tablet, Rfl: 7   tretinoin (RETIN-A) 0.05 % cream, Apply topically., Disp: , Rfl:    valACYclovir  (VALTREX ) 1000 MG tablet, Take 1 tablet (1,000 mg total) by mouth 2 (two) times daily., Disp: 14 tablet, Rfl: 5   ZEPBOUND  15 MG/0.5ML Pen, Inject 15 mg into the skin once a week., Disp: , Rfl:   Current Facility-Administered Medications:    tezepelumab -ekko (TEZSPIRE ) 210 MG/1. syringe 210 mg, 210 mg, Subcutaneous, Q28 days, Luke Orlan HERO, DO, 210 mg at 08/19/21 1138  Review of Systems:  Negative unless indicated in HPI.   Physical Exam: Vitals:   10/31/23 1102  BP: 108/68  Pulse: 100  Temp: 98.5 F (36.9 C)  SpO2: 97%  Weight: 196 lb 12.8 oz (89.3 kg)  Height: 5' 5 (1.651 m)    Body mass index is 32.75 kg/m.   Physical Exam Vitals reviewed.  Constitutional:      Appearance: Normal appearance.  HENT:     Right Ear: Tympanic membrane, ear canal and external ear normal.     Left Ear: Tympanic membrane, ear canal and external ear normal.  Eyes:     Conjunctiva/sclera: Conjunctivae normal.  Neurological:     Mental Status: She is alert.       Impression and Plan:  Upper respiratory tract infection, unspecified type  Right ear pain  -No signs of ear infection.  Suspect this is a URI.  Advised OTC Tylenol  and ibuprofen , decongestants, antihistamines and guaifenesin .  Return in 10 to 14 days if no improvement.   Time spent:21 minutes reviewing chart, interviewing and examining patient and formulating plan of care.     Tully Theophilus Andrews, MD [facility]

## 2023-11-09 ENCOUNTER — Telehealth: Payer: Self-pay | Admitting: Pharmacy Technician

## 2023-11-09 ENCOUNTER — Other Ambulatory Visit (HOSPITAL_COMMUNITY): Payer: Self-pay

## 2023-11-09 NOTE — Telephone Encounter (Signed)
 Pharmacy Patient Advocate Encounter   Received notification from CoverMyMeds that prior authorization for REYVOW  10MG  is required/requested.   Insurance verification completed.   The patient is insured through [facility] .   Per test claim: PA required; PA submitted to above mentioned insurance via CoverMyMeds Key/confirmation #/EOC [id number] Status is pending

## 2023-11-13 ENCOUNTER — Other Ambulatory Visit (HOSPITAL_COMMUNITY): Payer: Self-pay

## 2023-11-13 NOTE — Telephone Encounter (Signed)
 Pharmacy Patient Advocate Encounter  Received notification from [facility] that Prior Authorization for REYVOW  100MG  has been APPROVED from 7.18.25 to 7.18.26. Unable to obtain price due to refill too soon rejection, last fill date 7.19.25 next available fill date7.31.25   PA #/Case ID/Reference #: [id number]

## 2023-11-28 ENCOUNTER — Other Ambulatory Visit: Payer: Self-pay | Admitting: Family Medicine

## 2023-11-28 DIAGNOSIS — F3341 Major depressive disorder, recurrent, in partial remission: Secondary | ICD-10-CM

## 2023-12-12 ENCOUNTER — Ambulatory Visit

## 2023-12-15 ENCOUNTER — Other Ambulatory Visit: Payer: Self-pay | Admitting: Family Medicine

## 2023-12-19 ENCOUNTER — Ambulatory Visit (INDEPENDENT_AMBULATORY_CARE_PROVIDER_SITE_OTHER): Admitting: Family Medicine

## 2023-12-19 ENCOUNTER — Encounter: Payer: Self-pay | Admitting: Family Medicine

## 2023-12-19 VITALS — BP 100/60 | HR 90 | Temp 98.9°F | Ht 65.0 in | Wt 197.4 lb

## 2023-12-19 DIAGNOSIS — M25511 Pain in right shoulder: Secondary | ICD-10-CM

## 2023-12-19 DIAGNOSIS — S46001D Unspecified injury of muscle(s) and tendon(s) of the rotator cuff of right shoulder, subsequent encounter: Secondary | ICD-10-CM

## 2023-12-19 DIAGNOSIS — G8929 Other chronic pain: Secondary | ICD-10-CM

## 2023-12-19 MED ORDER — MELOXICAM 7.5 MG PO TABS: 7.5000 mg | ORAL_TABLET | Freq: Every day | ORAL | 0 refills | Status: DC

## 2023-12-19 NOTE — Progress Notes (Signed)
 Established Patient Office Visit   Subjective  Patient ID: Alyssa Clay, female    DOB: [birth year]  Age: 58 y.o. MRN: [id number]  Chief Complaint  Patient presents with   Shoulder Pain    Patient complains of right shoulder pain x1 week, tried Motrin  and heat with no relief, questioned if related to picking up her grandson, states she is having PT for this as well as PT for migraines,etc    Pt is a 58 yo female followed by Dr. Ozell and seen for acute concern.  Pt with intermittent R shoulder pain.  Recent episode may have been aggravated by picking up her 1 yo grandson.  Pain in posterior R shoulder can be a 9/10 with movement.  In PT and getting dry needling.  Doing exercises at home.  Unable to lift R arm overhead. Tried tylenol , heat.    Patient Active Problem List   Diagnosis Date Noted   Vertigo 09/05/2022   Acute viral pharyngitis 06/06/2022   Acute left otitis media 02/21/2022   Recurrent infections 11/14/2021   Vaginal yeast infection 11/14/2021   Rash and other nonspecific skin eruption 11/14/2021   Class 2 obesity with body mass index (BMI) of 38.0 to 38.9 in adult 10/19/2021   History of COVID-19 09/12/2021   Chronic migraine without aura without status migrainosus, not intractable 05/26/2021   Lumbar radiculopathy, right 10/08/2020   SI (sacroiliac) joint dysfunction 09/16/2020   Somatic dysfunction of spine, sacral 09/16/2020   OSA (obstructive sleep apnea) 12/18/2019   FUO (fever of unknown origin) 10/21/2019   S/P bilateral breast reduction 06/19/2019   Back pain 04/15/2019   Neck pain 04/15/2019   History of frequent upper respiratory infection 01/16/2019   Atelectasis 01/16/2019   Severe persistent asthma without complication 01/16/2019   Shortness of breath 01/01/2019   Primary osteoarthritis of both knees 07/10/2018   Hypertension 03/27/2018   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 USE   Enlarged ovary 09/15/2020   noted on ct scan, per pt   FUO (fever of unknown origin) 10/21/2019   GERD (gastroesophageal reflux disease)    Hypertension    PAST hx    Migraine    Past Surgical History:  Procedure Laterality Date   APPENDECTOMY     BREAST REDUCTION SURGERY Bilateral 06/12/2019   Procedure: BREAST REDUCTION WITH LIPOSUCTION;  Surgeon: Lowery Estefana RAMAN, DO;  Location: [facility];  Service: Plastics;  Laterality: Bilateral;  4 hours, please   CESAREAN SECTION  1994   DILATION AND CURETTAGE OF UTERUS     multiple due to miscarriages   KNEE ARTHROSCOPY Left 2013   LAPAROSCOPIC APPENDECTOMY N/A 12/08/2018   Procedure: APPENDECTOMY LAPAROSCOPIC;  Surgeon: Belinda Cough, MD;  Location: [facility];  Service: General;  Laterality: N/A;   REDUCTION MAMMAPLASTY Bilateral 2021   TENNIS ELBOW RELEASE/NIRSCHEL PROCEDURE  2009   WISDOM TOOTH EXTRACTION     Social History  Tobacco Use   Smoking status: Never   Smokeless tobacco: Never  Vaping Use   Vaping status: Never Used  Substance Use Topics   Alcohol use: Yes    Comment: occ 2-3 beers a month    Drug use: Never   Family History  Problem Relation Age of Onset   Depression Mother    Lymphoma Mother 57   Lung cancer Mother    Hypertension Father    Heart attack Father 75   Hypertension Brother    High blood pressure Brother    High Cholesterol Brother    Healthy Daughter    Colon cancer Maternal Aunt 48   Esophageal cancer Neg Hx    Pancreatic cancer Neg Hx     Stomach cancer Neg Hx    Colon polyps Neg Hx    Rectal cancer Neg Hx    Allergies  Allergen Reactions   Penicillins Hives and Other (See Comments)    Yeast infections Yeast infections   Pt wants listed as an allergy    Zonisamide Hives    ROS Negative unless stated above    Objective:     BP 100/60   Pulse 90   Temp 98.9 F (37.2 C) (Oral)   Ht 5' 5 (1.651 m)   Wt 197 lb 6.4 oz (89.5 kg)   LMP 12/20/2018   SpO2 96%   BMI 32.85 kg/m  BP Readings from Last 3 Encounters:  12/19/23 100/60  10/31/23 108/68  10/18/23 112/68   Wt Readings from Last 3 Encounters:  12/19/23 197 lb 6.4 oz (89.5 kg)  10/31/23 196 lb 12.8 oz (89.3 kg)  10/18/23 200 lb 11.2 oz (91 kg)      Physical Exam Constitutional:      General: She is not in acute distress.    Appearance: Normal appearance.  HENT:     Head: Normocephalic and atraumatic.     Nose: Nose normal.     Mouth/Throat:     Mouth: Mucous membranes are moist.  Cardiovascular:     Rate and Rhythm: Normal rate and regular rhythm.     Heart sounds: Normal heart sounds. No murmur heard.    No gallop.  Pulmonary:     Effort: Pulmonary effort is normal. No respiratory distress.     Breath sounds: Normal breath sounds. No wheezing, rhonchi or rales.  Musculoskeletal:     Right shoulder: Tenderness present. Decreased range of motion.     Left shoulder: Normal.       Arms:     Cervical back: Normal.     Thoracic back: Normal.     Lumbar back: Normal.     Comments: Decreased active and passive ROM on RUE. Abduction of RUE 90 degrees.  Unable to complete NEERs or Hawkins.  Positive empty can, cross arm.  Pain with holding R elbow against R side and turning arm outward.  Skin:    General: Skin is warm and dry.  Neurological:     Mental Status: She is alert and oriented to person, place, and time.     No results found for any visits on 12/19/23.    Assessment & Plan:   Chronic right shoulder pain -     Ambulatory  referral to Orthopedic Surgery -     Meloxicam ; Take 1 tablet (7.5 mg total) by mouth daily.  Dispense: 30 tablet; Refill: 0  Injury of right rotator cuff, subsequent encounter -     Ambulatory referral to Orthopedic Surgery -  Meloxicam ; Take 1 tablet (7.5 mg total) by mouth daily.  Dispense: 30 tablet; Refill: 0  Concern for rotator cuff injury/partial tear.  Referral to Ortho.  Continue supportive care.  Mobic . Limit heavy lifting.  Given precautions.  Return if symptoms worsen or fail to improve.   Alyssa JONELLE Single, MD

## 2023-12-25 ENCOUNTER — Telehealth: Payer: Self-pay | Admitting: *Deleted

## 2023-12-25 NOTE — Telephone Encounter (Signed)
 Copied from CRM [id number]. Topic: Clinical - Medication Question >> Dec 25, 2023 10:27 AM Alyssa Clay wrote: Reason for CRM: Patient would like to know if she should get a xray seeing that her appointment for her shoulder isn't until the 9/9 and she is still in pain please give call back to confirm

## 2023-12-26 ENCOUNTER — Ambulatory Visit

## 2024-01-01 ENCOUNTER — Ambulatory Visit: Admitting: Orthopaedic Surgery

## 2024-01-02 NOTE — Telephone Encounter (Signed)
Keep appt with Ortho

## 2024-01-03 ENCOUNTER — Ambulatory Visit

## 2024-01-03 ENCOUNTER — Telehealth: Payer: Self-pay | Admitting: *Deleted

## 2024-01-03 MED ORDER — COVID-19 MRNA VACC (MODERNA) 50 MCG/0.5ML IM SUSY: 0.5000 mL | PREFILLED_SYRINGE | Freq: Once | INTRAMUSCULAR | 0 refills | Status: AC

## 2024-01-03 NOTE — Telephone Encounter (Signed)
 Rx done.

## 2024-01-03 NOTE — Telephone Encounter (Signed)
 Copied from CRM #[id number]. Topic: Clinical - Medication Question >> Jan 03, 2024  8:28 AM Robinson H wrote: Reason for CRM: Patient wants a prescription for the covid vaccine sent [facility] on file  [place] [phone]

## 2024-01-09 ENCOUNTER — Ambulatory Visit

## 2024-01-09 ENCOUNTER — Telehealth: Payer: Self-pay | Admitting: *Deleted

## 2024-01-09 DIAGNOSIS — S46001D Unspecified injury of muscle(s) and tendon(s) of the rotator cuff of right shoulder, subsequent encounter: Secondary | ICD-10-CM

## 2024-01-09 DIAGNOSIS — G8929 Other chronic pain: Secondary | ICD-10-CM

## 2024-01-09 NOTE — Telephone Encounter (Signed)
 Fax received from [facility] for a refill on Meloxicam .  Sent to PCP as last Rx was given by Dr Mercer.

## 2024-01-10 MED ORDER — MELOXICAM 7.5 MG PO TABS: 7.5000 mg | ORAL_TABLET | Freq: Every day | ORAL | 1 refills | Status: DC

## 2024-01-10 NOTE — Telephone Encounter (Signed)
 Script sent

## 2024-01-25 ENCOUNTER — Telehealth: Payer: Self-pay | Admitting: Pharmacy Technician

## 2024-01-25 NOTE — Telephone Encounter (Signed)
 Auth Submission: APPROVED Site of care: Site of care: [facility] Payer: [employer] Medication & CPT/J Code(s) submitted: Vyepti  (Eptinezumab ) G6967 Diagnosis Code:  Route of submission (phone, fax, portal): PORTAL Phone # Fax # Auth type: Buy/Bill PB Units/visits requested: 300MG  Q3 MONTHS Reference number: [id number] Approval from: 01/25/24 to 01/24/25

## 2024-02-11 ENCOUNTER — Ambulatory Visit (INDEPENDENT_AMBULATORY_CARE_PROVIDER_SITE_OTHER)

## 2024-02-11 VITALS — BP 126/77 | HR 82 | Temp 99.0°F | Resp 16 | Ht 65.0 in | Wt 195.6 lb

## 2024-02-11 DIAGNOSIS — G43709 Chronic migraine without aura, not intractable, without status migrainosus: Secondary | ICD-10-CM | POA: Diagnosis not present

## 2024-02-11 MED ORDER — SODIUM CHLORIDE 0.9 % IV SOLN
300.0000 mg | Freq: Once | INTRAVENOUS | Status: AC
  Administered 2024-02-11: 300 mg via INTRAVENOUS
  Filled 2024-02-11: qty 3

## 2024-02-11 MED ORDER — SODIUM CHLORIDE 0.9% FLUSH: 10.0000 mL | Freq: Two times a day (BID) | INTRAVENOUS | Status: DC

## 2024-02-11 NOTE — Progress Notes (Signed)
 Diagnosis: Chronic migraine without aura without status migrainosus, not intractable   Provider:  Praveen Mannam MD  Procedure: IV Infusion  IV Type: Peripheral, IV Location: L Forearm  Vyepti  (Eptinezumab -jjmr), Dose: 300 mg  Infusion Start Time: 1004  Infusion Stop Time: 1035 am  Post Infusion IV Care: Observation period completed and Peripheral IV Discontinued  Discharge: Condition: Good, Destination: Home . AVS Provided  Performed by:  Tahirah Sara, RN

## 2024-02-14 ENCOUNTER — Telehealth: Payer: Self-pay

## 2024-02-14 NOTE — Telephone Encounter (Signed)
 Refill request received from [facility] from Reyvow 

## 2024-02-18 ENCOUNTER — Encounter: Payer: Self-pay | Admitting: Neurology

## 2024-02-18 ENCOUNTER — Telehealth: Payer: Self-pay

## 2024-02-18 ENCOUNTER — Other Ambulatory Visit (HOSPITAL_COMMUNITY): Payer: Self-pay

## 2024-02-18 NOTE — Telephone Encounter (Signed)
 Pharmacy Patient Advocate Encounter  Received notification from [facility] that Prior Authorization for Zepbound  15 has been APPROVED from 02/18/24 to 02/17/25. Unable to obtain price due to refill too soon rejection, last fill date 12/17/23 next available fill date10/31/25   PA #/Case ID/Reference #: # [id number]

## 2024-02-18 NOTE — Telephone Encounter (Signed)
 Pharmacy Patient Advocate Encounter   Received notification from Onbase that prior authorization for Zepbound  15 is required/requested.   Insurance verification completed.   The patient is insured through [facility].   Per test claim: PA required; PA submitted to above mentioned insurance via Latent Key/confirmation #/EOC [employer] Status is pending

## 2024-02-25 ENCOUNTER — Encounter: Payer: Self-pay | Admitting: Radiology

## 2024-02-25 ENCOUNTER — Other Ambulatory Visit: Payer: Self-pay | Admitting: Neurology

## 2024-02-25 MED ORDER — REYVOW 100 MG PO TABS: 1.0000 | ORAL_TABLET | Freq: Every day | ORAL | 1 refills | Status: AC | PRN

## 2024-02-25 NOTE — Telephone Encounter (Signed)
 Pt called she has been out of Reyvow  for months she called the office to get RX [facility] a note was made but medication didn't get sent to [facility]. Pt [facility] so that she can get it to try and kick the headache she has now. Pt is going to [place] in January at the December appointment she will need a hard RX to take with her ,

## 2024-02-25 NOTE — Telephone Encounter (Signed)
 Who's calling (name and relationship to patient) : Burnard Jaegers; self   Best contact number: [phone]  Provider they see: Dr. Skeet   Reason for call:  Burnard lvm to confirm appt appt, she will be moving to [place] in January.  She also stated that she is not sure what is going on with her Reyvow , regarding if [facility]. She has not has not had Rx in months. Has a killer headache since last night. She is requesting a call back regarding Rx.   FYI: Fernando Torry back to confirm appt date and time.    Call ID:      PRESCRIPTION REFILL ONLY  Name of prescription: Reyvow  100 mg  Pharmacy:

## 2024-03-03 ENCOUNTER — Telehealth: Admitting: Family Medicine

## 2024-03-03 ENCOUNTER — Encounter: Payer: Self-pay | Admitting: Family Medicine

## 2024-03-03 VITALS — Ht 65.0 in | Wt 195.0 lb

## 2024-03-03 DIAGNOSIS — R0981 Nasal congestion: Secondary | ICD-10-CM | POA: Diagnosis not present

## 2024-03-03 DIAGNOSIS — R051 Acute cough: Secondary | ICD-10-CM | POA: Diagnosis not present

## 2024-03-03 DIAGNOSIS — J0140 Acute pansinusitis, unspecified: Secondary | ICD-10-CM

## 2024-03-03 MED ORDER — AZITHROMYCIN 250 MG PO TABS: ORAL_TABLET | ORAL | 0 refills | Status: AC

## 2024-03-03 NOTE — Progress Notes (Signed)
 Virtual Visit via Video Note  I connected with Alyssa Clay on 03/03/24 at  4:30 PM EST by a video enabled telemedicine application and verified that I am speaking with the correct person using two identifiers.  Location patient: home Location provider:work or home office Persons participating in the virtual visit: patient, provider  I discussed the limitations of evaluation and management by telemedicine and the availability of in person appointments. The patient expressed understanding and agreed to proceed. Chief Complaint  Patient presents with   Nasal Congestion    Congestion, cough and sneezing. Around two weeks ago having fevers. States swollen throat in random places. States stuffy nose and cough. Has not done any testing at home. Patient is taking Nyquil, Sudafed during the day and flonase  and motrin . Nothing is really helping too much.      HPI: Pt is a 58 yo female followed by Dr. Ozell and seen for acute concern.  Pt with neck and throat swelling x 2 wks.  Denies dysphagia, drooling.   Intermittent fever, aches.  Took motrin  last wk for subjective fever.  Now with sinus pressure that developed over the wknd.  Feels like is under water due to sensation in ears.  Has had rhinorrhea since yesterday.  Still achy.  Productive cough.  Using flonase , sudafed, motrin , and a humidifier.  No sick contacts, but did go to a [employer] hockey game recently.  Did not test for COVID.  ROS: See pertinent positives and negatives per HPI.  Past Medical History:  Diagnosis Date   Allergy    Anemia    Asthma    Constipation    issues x the past month -? 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 USE   Enlarged ovary 09/15/2020   noted on ct scan, per pt   FUO (fever of unknown origin) 10/21/2019   GERD (gastroesophageal reflux disease)    Hypertension    PAST hx    Migraine     Past Surgical History:  Procedure  Laterality Date   APPENDECTOMY     BREAST REDUCTION SURGERY Bilateral 06/12/2019   Procedure: BREAST REDUCTION WITH LIPOSUCTION;  Surgeon: Lowery Estefana RAMAN, DO;  Location: [facility];  Service: Plastics;  Laterality: Bilateral;  4 hours, please   CESAREAN SECTION  1994   DILATION AND CURETTAGE OF UTERUS     multiple due to miscarriages   KNEE ARTHROSCOPY Left 2013   LAPAROSCOPIC APPENDECTOMY N/A 12/08/2018   Procedure: APPENDECTOMY LAPAROSCOPIC;  Surgeon: Belinda Cough, MD;  Location: [facility];  Service: General;  Laterality: N/A;   REDUCTION MAMMAPLASTY Bilateral 2021   TENNIS ELBOW RELEASE/NIRSCHEL PROCEDURE  2009   WISDOM TOOTH EXTRACTION      Family History  Problem Relation Age of Onset   Depression Mother    Lymphoma Mother 54   Lung cancer Mother    Hypertension Father    Heart attack Father 72   Hypertension Brother    High blood pressure Brother    High Cholesterol Brother    Healthy Daughter    Colon cancer Maternal Aunt 38   Esophageal cancer Neg Hx    Pancreatic cancer Neg Hx    Stomach cancer Neg Hx    Colon polyps Neg Hx    Rectal cancer Neg Hx     Current Outpatient Medications:    albuterol  (PROVENTIL ) (2.5 MG/3ML) 0.083% nebulizer solution, Take  3 mLs (2.5 mg total) by nebulization every 4 (four) hours as needed for wheezing or shortness of breath (coughing fits)., Disp: 75 mL, Rfl: 1   albuterol  (VENTOLIN  HFA) 108 (90 Base) MCG/ACT inhaler, Inhale 2 puffs into the lungs every 4 (four) hours as needed for wheezing or shortness of breath (coughing fits)., Disp: 18 g, Rfl: 1   BREO ELLIPTA  200-25 MCG/ACT AEPB, INHALE 1 INHALATION BY MOUTH  INTO THE LUNGS DAILY RINSE MOUTH AFTER EACH USE, Disp: 180 each, Rfl: 3   budesonide  (PULMICORT ) 0.5 MG/2ML nebulizer solution, Take 2 mLs (0.5 mg total) by nebulization in the morning and at bedtime. Take for 1-2 weeks during asthma flare., Disp: 120 mL, Rfl: 2   buPROPion  (WELLBUTRIN  XL) 300 MG 24 hr tablet,  TAKE 1 TABLET BY MOUTH DAILY, Disp: 90 tablet, Rfl: 1   celecoxib (CELEBREX) 200 MG capsule, TAKE 1 CAPSULE BY MOUTH DAILY AS NEEDED . (REPLACES MELOXICAM ), Disp: , Rfl:    DULoxetine  (CYMBALTA ) 60 MG capsule, TAKE 1 CAPSULE BY MOUTH DAILY, Disp: 90 capsule, Rfl: 3   Estradiol  (YUVAFEM ) 10 MCG TABS vaginal tablet, Place 1 tablet (10 mcg total) vaginally daily., Disp: 8 tablet, Rfl: 0   Lasmiditan  Succinate (REYVOW ) 100 MG TABS, Take 1 tablet (100 mg total) by mouth daily as needed., Disp: 4 tablet, Rfl: 1   Multiple Vitamin (MULTIVITAMIN WITH MINERALS) TABS tablet, Take 1 tablet by mouth daily., Disp: , Rfl:    ondansetron  (ZOFRAN ) 4 MG tablet, Take 1 tablet (4 mg total) by mouth every 8 (eight) hours as needed for nausea or vomiting., Disp: 30 tablet, Rfl: 1   Respiratory Therapy Supplies (FLUTTER) DEVI, Use 3 times daily as directed, Disp: 1 each, Rfl: 0   rizatriptan  (MAXALT ) 10 MG tablet, Take 1 tablet (10 mg total) by mouth as needed for migraine. May repeat in 2 hours if needed.  Maximum 2 tablets in 24 hours., Disp: 10 tablet, Rfl: 5   Spacer/Aero-Holding Chambers (AEROCHAMBER MV) inhaler, Use as instructed, Disp: 1 each, Rfl: 1   Tezepelumab -ekko (TEZSPIRE ) 210 MG/1. SOAJ, Inject 210 mg into the skin every 28 (twenty-eight) days., Disp: 1.91 mL, Rfl: 11   traZODone  (DESYREL ) 100 MG tablet, TAKE 1 TO 2 TABLETS BY MOUTH AT  BEDTIME AS NEEDED FOR SLEEP, Disp: 90 tablet, Rfl: 2   tretinoin (RETIN-A) 0.05 % cream, Apply topically., Disp: , Rfl:    valACYclovir  (VALTREX ) 1000 MG tablet, Take 1 tablet (1,000 mg total) by mouth 2 (two) times daily., Disp: 14 tablet, Rfl: 5   ZEPBOUND  15 MG/0.5ML Pen, Inject 15 mg into the skin once a week., Disp: , Rfl:    azelastine  (ASTELIN ) 0.1 % nasal spray, one spray by Both Nostrils route 2 (two) times daily. Use in each nostril as directed, Disp: , Rfl:    meloxicam  (MOBIC ) 7.5 MG tablet, Take 1 tablet (7.5 mg total) by mouth daily., Disp: 90 tablet, Rfl:  1  Current Facility-Administered Medications:    tezepelumab -ekko (TEZSPIRE ) 210 MG/1. syringe 210 mg, 210 mg, Subcutaneous, Q28 days, Luke Orlan HERO, DO, 210 mg at 08/19/21 1138  EXAM:  VITALS per patient if applicable: RR between 12-20 bpm.  GENERAL: alert, oriented, appears sick, non toxic, and in no acute distress  HEENT: atraumatic, conjunctiva clear, no obvious abnormalities on inspection of external nose and ears  NECK: normal movements of the head and neck  LUNGS: cough.  on inspection no signs of respiratory distress, breathing rate appears normal, no obvious gross SOB,  gasping or wheezing  CV: no obvious cyanosis  MS: moves all visible extremities without noticeable abnormality  PSYCH/NEURO: pleasant and cooperative, no obvious depression or anxiety, speech and thought processing grossly intact  ASSESSMENT AND PLAN:  Discussed the following assessment and plan:  Acute pansinusitis, recurrence not specified - Plan: azithromycin  (ZITHROMAX ) 250 MG tablet  Nasal congestion  Acute cough  Acute viral URI sx, now sinusitis.  Start abx. Allergy to PCN.  Continue supportive care including gargling with warm salt water, otc cough/cold meds, flonase , steam from shower, rest, hydration, etc.  Given precautions.  For continued or worsened sx f/u in clinic.   I discussed the assessment and treatment plan with the patient. The patient was provided an opportunity to ask questions and all were answered. The patient agreed with the plan and demonstrated an understanding of the instructions.   The patient was advised to call back or seek an in-person evaluation if the symptoms worsen or if the condition fails to improve as anticipated.   Alyssa JONELLE Single, MD

## 2024-03-04 NOTE — Progress Notes (Addendum)
 [facility] Visit Note    Alyssa Clay is a 58 y.o. female who presents with nasal congestion and cough x 2 weeks.   History obtained from patient.   Assessment & Plan:    Assessment & Plan Acute non-recurrent maxillary sinusitis Orders:   doxycycline  (VIBRAMYCIN ) 100 MG capsule; Take 1 capsule (100 mg total) by mouth 2 (two) times a day for 10 days   methylPREDNISolone  (MEDROL  DOSEPACK) 4 mg tablet; follow package directions    No follow-ups on file.  Subjective     Respiratory Duration of current symptoms:  2 weeks Cough Characteristics: productive of sputum   Associated symptoms: cough, nasal congestion and postnasal drip   Associated symptoms: no chest pain, no chest tightness, no ear pain, no nausea, no rhinorrhea, no shortness of breath, no sore throat, no diaphoresis, no swollen glands, no vomiting and no wheezing   Treatments tried:  Rest Response to treatment:  No change    Allergies[1]  Review of Systems  Constitutional:  Negative for diaphoresis.  HENT:  Positive for congestion, postnasal drip and sinus pressure. Negative for ear pain, rhinorrhea and sore throat.   Respiratory:  Positive for cough. Negative for chest tightness, shortness of breath and wheezing.   Cardiovascular:  Negative for chest pain.  Gastrointestinal:  Negative for abdominal pain, nausea and vomiting.  Neurological:  Negative for dizziness and light-headedness.  Psychiatric/Behavioral:  The patient is not nervous/anxious.      Objective     Vital Signs: BP 126/81   Pulse 98   Temp 97.7 F (36.5 C)   Resp 18   Ht 5' 5 (1.651 m)   Wt 195 lb (88.5 kg)   SpO2 98%   BMI 32.45 kg/m    Physical Exam HENT:     Head: Normocephalic.     Right Ear: Tympanic membrane, ear canal and external ear normal.     Left Ear: Tympanic membrane, ear canal and external ear normal.     Nose: Congestion present.     Right Sinus: Maxillary sinus tenderness present.     Left Sinus: Maxillary  sinus tenderness present.     Mouth/Throat:     Mouth: Mucous membranes are moist.     Pharynx: Posterior oropharyngeal erythema present.  Cardiovascular:     Rate and Rhythm: Normal rate and regular rhythm.     Heart sounds: Normal heart sounds.  Pulmonary:     Effort: Pulmonary effort is normal.     Breath sounds: Normal breath sounds.  Musculoskeletal:     Cervical back: Normal range of motion.  Skin:    General: Skin is warm.  Neurological:     General: No focal deficit present.     Mental Status: She is alert and oriented to person, place, and time.  Psychiatric:        Mood and Affect: Mood normal.                         [1] Allergies Allergen Reactions   Penicillins Hives, Other (See Comments) and Rash    Yeast infections  Yeast infections    Yeast infections Yeast infections   Pt wants listed as an allergy  Yeast infections  Yeast infections Yeast infections   Pt wants listed as an allergy    Yeast infections Yeast infections   Pt wants listed as an allergy  Yeast infections  Yeast infections      Pt wants listed as an allergy  Zonisamide Hives

## 2024-03-05 ENCOUNTER — Telehealth: Payer: Self-pay | Admitting: Allergy

## 2024-03-05 NOTE — Telephone Encounter (Addendum)
 Patient called and stated she has a sinus infection and overnight she developed a hoarse cough. Patient stated she went and saw a Nurse Practitioner yesterday and they have prescribed her with some medication.   Patient is calling and requesting a call back as soon as possible as she has questions regarding her Nebulizer and is unsure what medications to take with her nebulizer and the measurements as she threw the instructions away.  Best Contact: [phone]

## 2024-03-06 ENCOUNTER — Ambulatory Visit: Admitting: Allergy

## 2024-03-06 ENCOUNTER — Other Ambulatory Visit: Payer: Self-pay

## 2024-03-06 ENCOUNTER — Encounter: Payer: Self-pay | Admitting: Allergy

## 2024-03-06 VITALS — BP 122/78 | HR 92 | Temp 97.9°F | Resp 14 | Ht 65.0 in | Wt 200.5 lb

## 2024-03-06 DIAGNOSIS — B999 Unspecified infectious disease: Secondary | ICD-10-CM | POA: Diagnosis not present

## 2024-03-06 DIAGNOSIS — J9811 Atelectasis: Secondary | ICD-10-CM

## 2024-03-06 DIAGNOSIS — J4551 Severe persistent asthma with (acute) exacerbation: Secondary | ICD-10-CM | POA: Diagnosis not present

## 2024-03-06 DIAGNOSIS — J310 Chronic rhinitis: Secondary | ICD-10-CM

## 2024-03-06 DIAGNOSIS — K219 Gastro-esophageal reflux disease without esophagitis: Secondary | ICD-10-CM

## 2024-03-06 DIAGNOSIS — J988 Other specified respiratory disorders: Secondary | ICD-10-CM

## 2024-03-06 MED ORDER — METHYLPREDNISOLONE ACETATE 80 MG/ML IJ SUSP
80.0000 mg | Freq: Once | INTRAMUSCULAR | Status: AC
Start: 2024-03-06 — End: 2024-03-06
  Administered 2024-03-06: 80 mg via INTRAMUSCULAR

## 2024-03-06 MED ORDER — ALBUTEROL SULFATE HFA 108 (90 BASE) MCG/ACT IN AERS: 2.0000 | INHALATION_SPRAY | RESPIRATORY_TRACT | 1 refills | Status: AC | PRN

## 2024-03-06 MED ORDER — DEXTROMETHORPHAN HBR 15 MG/5ML PO SYRP: 10.0000 mL | ORAL_SOLUTION | Freq: Four times a day (QID) | ORAL | 0 refills | Status: DC | PRN

## 2024-03-06 NOTE — Progress Notes (Signed)
 Follow Up Note  RE: Alyssa Clay MRN: [id number] DOB: [birth year] Date of Office Visit: 03/06/2024  Referring provider: Ozell Heron HERO, MD Primary care provider: Ozell Heron HERO, MD  Chief Complaint: Follow-up (She had swollen glands and fever since 2 weeks. She had to use nebulizer last night because of coughing. )  History of Present Illness: I had the pleasure of seeing Alyssa Clay for a follow up visit at the [facility] on 03/06/2024. She is a 58 y.o. female, who is being followed for asthma on Tezspire , atelectasis, chronic rhinitis, GERD, recurrent infections. Her previous allergy office visit was on 10/08/2023 with Dr. Luke. Today is a new complaint visit of coughing. She is accompanied today by her husband who provided/contributed to the history.   Discussed the use of AI scribe software for clinical note transcription with the patient, who gave verbal consent to proceed.    She has been experiencing a persistent cough and respiratory symptoms for the past two weeks. Initially, she had intermittent fevers and severe throat swelling, which progressed to significant sinus pressure over the weekend. A phone consultation led to a prescription for a Z-Pak and a Medrol  Pak, but after visiting a [facility], she was advised to switch to doxycycline . Tests for flu, COVID, and mono were negative.  She is currently taking doxycycline  (one pill twice a day) and a Medrol  Pak. Despite using Tessalon  Perles, and NyQuil, the cough persists. Nebulizer treatments with budesonide  and albuterol  have provided temporary relief. She uses Breo (one puff daily) but has missed her Tezspire  injections for the past two months due to personal circumstances.  She reports achiness, which was severe last week and required Motrin  for relief.  She has been using a humidifier but found it worsened her symptoms.     Assessment and Plan: Amira is a 58 y.o. female with: Severe  persistent asthma with acute exacerbation (HCC) Respiratory infection Past history - Spiriva  caused coughing. Normal alpha-1 level. Seen by pulmonary.Started Tezspire  on 03/31/21. Interim history - late on Tezspire  injections for 2+ months. Symptoms flared with current respiratory infection. Finish medrol  pak Finish zpak and doxycycline  Take some probiotics.  Steroid injection given today.  May take dextromethorphan  cough medicine 10mL every 4-6 hours as needed.  Daily controller medications: continue Breo 200mcg 1 puff once a day and rinse mouth after each use. Do the flutter valve and incentive spirometer. Continue Tezspire  injection every 4 weeks at home.  During respiratory infections/asthma flares:  Start Budesonide  0.5mg  nebulizer twice a day for 1-2 weeks until your breathing symptoms return to baseline.  Pretreat with albuterol  2 puffs or albuterol  nebulizer.  If you need to use your albuterol  nebulizer machine back to back within 15-30 minutes with no relief then please go to the ER/urgent care for further evaluation.  May use albuterol  rescue inhaler 2 puffs or nebulizer every 4 to 6 hours as needed for shortness of breath, chest tightness, coughing, and wheezing. May use albuterol  rescue inhaler 2 puffs 5 to 15 minutes prior to strenuous physical activities. Monitor frequency of use - if you need to use it more than twice per week on a consistent basis let us  know.   Atelectasis Past history - 9/9/202 CT chest showed: 1. Prominent elevation of the right hemidiaphragm with associated right lung base atelectasis including complete right middle lobe atelectasis. Right hemidiaphragm paralysis not excluded. Saw pulm.  Encouraged using flutter valve and incentive spirometer.   Chronic rhinitis Past  history - 2019 skin testing negative. Nasal saline spray (i.e., Simply Saline) or nasal saline lavage (i.e., NeilMed) is recommended as needed and prior to medicated nasal  sprays.  Recurrent infections Past history - 2025 labs normal immunoglobulin levels, pneumococcal titers over 50% protective. Keep track of infections and antibiotics use.  Gastroesophageal reflux disease, unspecified whether esophagitis present Continue reflux lifestyle modifications and diet.  May use over the counter medication as needed.   Return in about 4 weeks (around 04/03/2024).  Meds ordered this encounter  Medications   albuterol  (VENTOLIN  HFA) 108 (90 Base) MCG/ACT inhaler    Sig: Inhale 2 puffs into the lungs every 4 (four) hours as needed for wheezing or shortness of breath (coughing fits).    Dispense:  18 g    Refill:  1   dextromethorphan  15 MG/5ML syrup    Sig: Take 10 mLs (30 mg total) by mouth 4 (four) times daily as needed for cough.    Dispense:  120 mL    Refill:  0   methylPREDNISolone  acetate (DEPO-MEDROL ) injection 80 mg   Lab Orders  No laboratory test(s) ordered today    Diagnostics: None.    Medication List:  Current Outpatient Medications  Medication Sig Dispense Refill   albuterol  (PROVENTIL ) (2.5 MG/3ML) 0.083% nebulizer solution Take 3 mLs (2.5 mg total) by nebulization every 4 (four) hours as needed for wheezing or shortness of breath (coughing fits). 75 mL 1   albuterol  (VENTOLIN  HFA) 108 (90 Base) MCG/ACT inhaler Inhale 2 puffs into the lungs every 4 (four) hours as needed for wheezing or shortness of breath (coughing fits). 18 g 1   BREO ELLIPTA  200-25 MCG/ACT AEPB INHALE 1 INHALATION BY MOUTH  INTO THE LUNGS DAILY RINSE MOUTH AFTER EACH USE 180 each 3   budesonide  (PULMICORT ) 0.5 MG/2ML nebulizer solution Take 2 mLs (0.5 mg total) by nebulization in the morning and at bedtime. Take for 1-2 weeks during asthma flare. 120 mL 2   buPROPion  (WELLBUTRIN  XL) 300 MG 24 hr tablet TAKE 1 TABLET BY MOUTH DAILY 90 tablet 1   celecoxib (CELEBREX) 200 MG capsule TAKE 1 CAPSULE BY MOUTH DAILY AS NEEDED . (REPLACES MELOXICAM )     dextromethorphan  15  MG/5ML syrup Take 10 mLs (30 mg total) by mouth 4 (four) times daily as needed for cough. 120 mL 0   doxycycline  (VIBRAMYCIN ) 100 MG capsule Take 100 mg by mouth.     Estradiol  (YUVAFEM ) 10 MCG TABS vaginal tablet Place 1 tablet (10 mcg total) vaginally daily. 8 tablet 0   Lasmiditan  Succinate (REYVOW ) 100 MG TABS Take 1 tablet (100 mg total) by mouth daily as needed. 4 tablet 1   Multiple Vitamin (MULTIVITAMIN WITH MINERALS) TABS tablet Take 1 tablet by mouth daily.     ondansetron  (ZOFRAN ) 4 MG tablet Take 1 tablet (4 mg total) by mouth every 8 (eight) hours as needed for nausea or vomiting. 30 tablet 1   Respiratory Therapy Supplies (FLUTTER) DEVI Use 3 times daily as directed 1 each 0   rizatriptan  (MAXALT ) 10 MG tablet Take 1 tablet (10 mg total) by mouth as needed for migraine. May repeat in 2 hours if needed.  Maximum 2 tablets in 24 hours. 10 tablet 5   Tezepelumab -ekko (TEZSPIRE ) 210 MG/1. SOAJ Inject 210 mg into the skin every 28 (twenty-eight) days. 1.91 mL 11   traZODone  (DESYREL ) 100 MG tablet TAKE 1 TO 2 TABLETS BY MOUTH AT  BEDTIME AS NEEDED FOR SLEEP 90 tablet 2  tretinoin (RETIN-A) 0.05 % cream Apply topically.     valACYclovir  (VALTREX ) 1000 MG tablet Take 1 tablet (1,000 mg total) by mouth 2 (two) times daily. 14 tablet 5   ZEPBOUND  15 MG/0.5ML Pen Inject 15 mg into the skin once a week.     azelastine  (ASTELIN ) 0.1 % nasal spray one spray by Both Nostrils route 2 (two) times daily. Use in each nostril as directed (Patient not taking: Reported on 03/06/2024)     azithromycin  (ZITHROMAX ) 250 MG tablet Take 2 tablets on day 1, then 1 tablet daily on days 2 through 5 (Patient not taking: Reported on 03/06/2024) 6 tablet 0   DULoxetine  (CYMBALTA ) 60 MG capsule TAKE 1 CAPSULE BY MOUTH DAILY (Patient not taking: Reported on 03/06/2024) 90 capsule 3   meloxicam  (MOBIC ) 7.5 MG tablet Take 1 tablet (7.5 mg total) by mouth daily. (Patient not taking: Reported on 03/06/2024) 90 tablet 1    Spacer/Aero-Holding Chambers (AEROCHAMBER MV) inhaler Use as instructed (Patient not taking: Reported on 03/06/2024) 1 each 1   Current Facility-Administered Medications  Medication Dose Route Frequency Provider Last Rate Last Admin   tezepelumab -ekko (TEZSPIRE ) 210 MG/1. syringe 210 mg  210 mg Subcutaneous Q28 days Luke Orlan HERO, DO   210 mg at 08/19/21 1138   Allergies: Allergies  Allergen Reactions   Penicillins Hives and Other (See Comments)    Yeast infections Yeast infections   Pt wants listed as an allergy    Zonisamide Hives   I reviewed her past medical history, social history, family history, and environmental history and no significant changes have been reported from her previous visit.  Review of Systems  Constitutional:  Negative for appetite change, chills, fever and unexpected weight change.  HENT:  Positive for sore throat. Negative for congestion and rhinorrhea.   Eyes:  Negative for itching.  Respiratory:  Positive for cough. Negative for chest tightness, shortness of breath and wheezing.   Gastrointestinal:  Negative for abdominal pain.  Skin:  Negative for rash.  Allergic/Immunologic: Positive for environmental allergies.    Objective: BP 122/78 (BP Location: Right Arm, Patient Position: Sitting, Cuff Size: Normal)   Pulse 92   Temp 97.9 F (36.6 C) (Temporal)   Resp 14   Ht 5' 5 (1.651 m)   Wt 200 lb 8 oz (90.9 kg)   LMP 12/20/2018   SpO2 96%   BMI 33.36 kg/m  Body mass index is 33.36 kg/m. Physical Exam Vitals and nursing note reviewed.  Constitutional:      Appearance: Normal appearance. She is well-developed.  HENT:     Head: Normocephalic and atraumatic.     Right Ear: Tympanic membrane and external ear normal.     Left Ear: Tympanic membrane and external ear normal.     Nose: Nose normal.     Mouth/Throat:     Mouth: Mucous membranes are moist.     Pharynx: Oropharynx is clear.  Eyes:     Conjunctiva/sclera: Conjunctivae normal.   Cardiovascular:     Rate and Rhythm: Normal rate and regular rhythm.     Heart sounds: Normal heart sounds. No murmur heard. Pulmonary:     Effort: Pulmonary effort is normal.     Breath sounds: Normal breath sounds. No wheezing, rhonchi or rales.  Musculoskeletal:     Cervical back: Neck supple.  Skin:    General: Skin is warm.     Findings: No rash.  Neurological:     Mental Status: She is alert and  oriented to person, place, and time.  Psychiatric:        Behavior: Behavior normal.    Previous notes and tests were reviewed. The plan was reviewed with the patient/family, and all questions/concerned were addressed.  It was my pleasure to see Vianca today and participate in her care. Please feel free to contact me with any questions or concerns.  Sincerely,  Orlan Cramp, DO Allergy & Immunology  [facility]  [place] office: [phone] [facility] [place] office: [phone]

## 2024-03-06 NOTE — Patient Instructions (Addendum)
 Respiratory infection? Finish medrol  pak Finish zpak and doxycycline  Take some probiotics.  Steroid injection given today.  May take dextromethorphan  cough medicine 10mL every 4-6 hours as needed. Also over the counter.  Asthma/atelectasis Daily controller medications: continue Breo 200mcg 1 puff once a day and rinse mouth after each use. Do the flutter valve and incentive spirometer. Continue Tezspire  injection every 4 weeks at home.  During respiratory infections/asthma flares:  Start Budesonide  0.5mg  nebulizer twice a day for 1-2 weeks until your breathing symptoms return to baseline.  Pretreat with albuterol  2 puffs or albuterol  nebulizer.  If you need to use your albuterol  nebulizer machine back to back within 15-30 minutes with no relief then please go to the ER/urgent care for further evaluation.  May use albuterol  rescue inhaler 2 puffs or nebulizer every 4 to 6 hours as needed for shortness of breath, chest tightness, coughing, and wheezing. May use albuterol  rescue inhaler 2 puffs 5 to 15 minutes prior to strenuous physical activities. Monitor frequency of use - if you need to use it more than twice per week on a consistent basis let us  know.  Asthma control goals:  Full participation in all desired activities (may need albuterol  before activity) Albuterol  use two times or less a week on average (not counting use with activity) Cough interfering with sleep two times or less a month Oral steroids no more than once a year No hospitalizations.  Infections Keep track of infections and antibiotics use.  Chronic rhinitis Nasal saline spray (i.e., Simply Saline) or nasal saline lavage (i.e., NeilMed) is recommended as needed and prior to medicated nasal sprays.   Gastroesophageal reflux disease Continue reflux lifestyle modifications and diet.  May use over the counter medication as needed.   Follow up in 1 months or sooner if needed.   Drink plenty of fluids. Water, juice,  clear broth or warm lemon water are good choices. Avoid caffeine and alcohol, which can dehydrate you. Eat chicken soup. Chicken soup and other warm fluids can be soothing and loosen congestion. Rest. Adjust your room's temperature and humidity. Keep your room warm but not overheated. If the air is dry, a cool-mist humidifier or vaporizer can moisten the air and help ease congestion and coughing. Keep the humidifier clean to prevent the growth of bacteria and molds. Soothe your throat. Perform a saltwater gargle. Dissolve one-quarter to a half teaspoon of salt in a 4- to 8-ounce glass of warm water. This can relieve a sore or scratchy throat temporarily. Use saline nasal drops. To help relieve nasal congestion, try saline nasal drops. You can buy these drops over the counter, and they can help relieve symptoms ? even in children. Take over-the-counter cold and cough medications. For adults and children older than 5, over-the-counter decongestants, antihistamines and pain relievers might offer some symptom relief. However, they won't prevent a cold or shorten its duration.

## 2024-03-06 NOTE — Telephone Encounter (Signed)
 Patient was seen in office 03/06/2024 in the [facility].

## 2024-03-08 ENCOUNTER — Other Ambulatory Visit: Payer: Self-pay | Admitting: Family Medicine

## 2024-04-06 NOTE — Progress Notes (Deleted)
 Follow Up Note  RE: Alyssa Clay MRN: [id number] DOB: 08-27-1965 Date of Office Visit: 04/07/2024  Referring provider: Ozell Heron HERO, MD Primary care provider: Ozell Heron HERO, MD  Chief Complaint: No chief complaint on file.  History of Present Illness: I had the pleasure of seeing Alyssa Clay for a follow up visit at the [facility] on 04/07/2024. She is a 58 y.o. female, who is being followed for asthma on Tezspire , atelectasis, chronic rhinitis, recurrent infections, GERD. Her previous allergy office visit was on 03/06/2024 with Dr. Luke. Today is a regular follow up visit.  Discussed the use of AI scribe software for clinical note transcription with the patient, who gave verbal consent to proceed.  History of Present Illness            ***  Assessment and Plan: Alyssa Clay is a 57 y.o. female with: Severe persistent asthma with acute exacerbation (HCC) Respiratory infection Past history - Spiriva  caused coughing. Normal alpha-1 level. Seen by pulmonary.Started Tezspire  on 03/31/21. Interim history - late on Tezspire  injections for 2+ months. Symptoms flared with current respiratory infection. Finish medrol  pak Finish zpak and doxycycline  Take some probiotics.  Steroid injection given today.  May take dextromethorphan  cough medicine 10mL every 4-6 hours as needed.   Daily controller medications: continue Breo 200mcg 1 puff once a day and rinse mouth after each use. Do the flutter valve and incentive spirometer. Continue Tezspire  injection every 4 weeks at home.  During respiratory infections/asthma flares:  Start Budesonide  0.5mg  nebulizer twice a day for 1-2 weeks until your breathing symptoms return to baseline.  Pretreat with albuterol  2 puffs or albuterol  nebulizer.  If you need to use your albuterol  nebulizer machine back to back within 15-30 minutes with no relief then please go to the ER/urgent care for further evaluation.  May use  albuterol  rescue inhaler 2 puffs or nebulizer every 4 to 6 hours as needed for shortness of breath, chest tightness, coughing, and wheezing. May use albuterol  rescue inhaler 2 puffs 5 to 15 minutes prior to strenuous physical activities. Monitor frequency of use - if you need to use it more than twice per week on a consistent basis let us  know.    Atelectasis Past history - 9/9/202 CT chest showed: 1. Prominent elevation of the right hemidiaphragm with associated right lung base atelectasis including complete right middle lobe atelectasis. Right hemidiaphragm paralysis not excluded. Saw pulm.  Encouraged using flutter valve and incentive spirometer.    Chronic rhinitis Past history - 2019 skin testing negative. Nasal saline spray (i.e., Simply Saline) or nasal saline lavage (i.e., NeilMed) is recommended as needed and prior to medicated nasal sprays.   Recurrent infections Past history - 2025 labs normal immunoglobulin levels, pneumococcal titers over 50% protective. Keep track of infections and antibiotics use.   Gastroesophageal reflux disease, unspecified whether esophagitis present Continue reflux lifestyle modifications and diet.  May use over the counter medication as needed.  Assessment and Plan              No follow-ups on file.  No orders of the defined types were placed in this encounter.  Lab Orders  No laboratory test(s) ordered today    Diagnostics: Spirometry:  Tracings reviewed. Her effort: {Blank single:19197::Good reproducible efforts.,It was hard to get consistent efforts and there is a question as to whether this reflects a maximal maneuver.,Poor effort, data can not be interpreted.} FVC: ***L FEV1: ***L, ***% predicted FEV1/FVC ratio: ***%  Interpretation: {Blank single:19197::Spirometry consistent with mild obstructive disease,Spirometry consistent with moderate obstructive disease,Spirometry consistent with severe obstructive  disease,Spirometry consistent with possible restrictive disease,Spirometry consistent with mixed obstructive and restrictive disease,Spirometry uninterpretable due to technique,Spirometry consistent with normal pattern,No overt abnormalities noted given today's efforts}.  Please see scanned spirometry results for details.  Skin Testing: {Blank single:19197::Select foods,Environmental allergy panel,Environmental allergy panel and select foods,Food allergy panel,None,Deferred due to recent antihistamines use}. *** Results discussed with patient/family.   Medication List:  Current Outpatient Medications  Medication Sig Dispense Refill   albuterol  (PROVENTIL ) (2.5 MG/3ML) 0.083% nebulizer solution Take 3 mLs (2.5 mg total) by nebulization every 4 (four) hours as needed for wheezing or shortness of breath (coughing fits). 75 mL 1   albuterol  (VENTOLIN  HFA) 108 (90 Base) MCG/ACT inhaler Inhale 2 puffs into the lungs every 4 (four) hours as needed for wheezing or shortness of breath (coughing fits). 18 g 1   azelastine  (ASTELIN ) 0.1 % nasal spray one spray by Both Nostrils route 2 (two) times daily. Use in each nostril as directed (Patient not taking: Reported on 03/06/2024)     BREO ELLIPTA  200-25 MCG/ACT AEPB INHALE 1 INHALATION BY MOUTH  INTO THE LUNGS DAILY RINSE MOUTH AFTER EACH USE 180 each 3   budesonide  (PULMICORT ) 0.5 MG/2ML nebulizer solution Take 2 mLs (0.5 mg total) by nebulization in the morning and at bedtime. Take for 1-2 weeks during asthma flare. 120 mL 2   buPROPion  (WELLBUTRIN  XL) 300 MG 24 hr tablet TAKE 1 TABLET BY MOUTH DAILY 90 tablet 1   celecoxib (CELEBREX) 200 MG capsule TAKE 1 CAPSULE BY MOUTH DAILY AS NEEDED . (REPLACES MELOXICAM )     dextromethorphan  15 MG/5ML syrup Take 10 mLs (30 mg total) by mouth 4 (four) times daily as needed for cough. 120 mL 0   DULoxetine  (CYMBALTA ) 60 MG capsule TAKE 1 CAPSULE BY MOUTH DAILY (Patient not taking:  Reported on 03/06/2024) 90 capsule 3   Estradiol  (YUVAFEM ) 10 MCG TABS vaginal tablet Place 1 tablet (10 mcg total) vaginally daily. 8 tablet 0   Lasmiditan  Succinate (REYVOW ) 100 MG TABS Take 1 tablet (100 mg total) by mouth daily as needed. 4 tablet 1   meloxicam  (MOBIC ) 7.5 MG tablet Take 1 tablet (7.5 mg total) by mouth daily. (Patient not taking: Reported on 03/06/2024) 90 tablet 1   Multiple Vitamin (MULTIVITAMIN WITH MINERALS) TABS tablet Take 1 tablet by mouth daily.     ondansetron  (ZOFRAN ) 4 MG tablet Take 1 tablet (4 mg total) by mouth every 8 (eight) hours as needed for nausea or vomiting. 30 tablet 1   Respiratory Therapy Supplies (FLUTTER) DEVI Use 3 times daily as directed 1 each 0   rizatriptan  (MAXALT ) 10 MG tablet Take 1 tablet (10 mg total) by mouth as needed for migraine. May repeat in 2 hours if needed.  Maximum 2 tablets in 24 hours. 10 tablet 5   Spacer/Aero-Holding Chambers (AEROCHAMBER MV) inhaler Use as instructed (Patient not taking: Reported on 03/06/2024) 1 each 1   Tezepelumab -ekko (TEZSPIRE ) 210 MG/1. SOAJ Inject 210 mg into the skin every 28 (twenty-eight) days. 1.91 mL 11   traZODone  (DESYREL ) 100 MG tablet TAKE 1 TO 2 TABLETS BY MOUTH AT  BEDTIME AS NEEDED FOR SLEEP 90 tablet 7   tretinoin (RETIN-A) 0.05 % cream Apply topically.     valACYclovir  (VALTREX ) 1000 MG tablet Take 1 tablet (1,000 mg total) by mouth 2 (two) times daily. 14 tablet 5   ZEPBOUND  15 MG/0.5ML Pen  Inject 15 mg into the skin once a week.     Current Facility-Administered Medications  Medication Dose Route Frequency Provider Last Rate Last Admin   tezepelumab -ekko (TEZSPIRE ) 210 MG/1. syringe 210 mg  210 mg Subcutaneous Q28 days Alyssa Clay Orlan HERO, DO   210 mg at 08/19/21 1138   Allergies: Allergies[1] I reviewed her past medical history, social history, family history, and environmental history and no significant changes have been reported from her previous visit.  Review of  Systems  Constitutional:  Negative for appetite change, chills, fever and unexpected weight change.  HENT:  Positive for sore throat. Negative for congestion and rhinorrhea.   Eyes:  Negative for itching.  Respiratory:  Positive for cough. Negative for chest tightness, shortness of breath and wheezing.   Gastrointestinal:  Negative for abdominal pain.  Skin:  Negative for rash.  Allergic/Immunologic: Positive for environmental allergies.    Objective: LMP 12/20/2018  There is no height or weight on file to calculate BMI. Physical Exam Vitals and nursing note reviewed.  Constitutional:      Appearance: Normal appearance. She is well-developed.  HENT:     Head: Normocephalic and atraumatic.     Right Ear: Tympanic membrane and external ear normal.     Left Ear: Tympanic membrane and external ear normal.     Nose: Nose normal.     Mouth/Throat:     Mouth: Mucous membranes are moist.     Pharynx: Oropharynx is clear.  Eyes:     Conjunctiva/sclera: Conjunctivae normal.  Cardiovascular:     Rate and Rhythm: Normal rate and regular rhythm.     Heart sounds: Normal heart sounds. No murmur heard. Pulmonary:     Effort: Pulmonary effort is normal.     Breath sounds: Normal breath sounds. No wheezing, rhonchi or rales.  Musculoskeletal:     Cervical back: Neck supple.  Skin:    General: Skin is warm.     Findings: No rash.  Neurological:     Mental Status: She is alert and oriented to person, place, and time.  Psychiatric:        Behavior: Behavior normal.   Previous notes and tests were reviewed. The plan was reviewed with the patient/family, and all questions/concerned were addressed.  It was my pleasure to see Kajah today and participate in her care. Please feel free to contact me with any questions or concerns.  Sincerely,  Orlan Luke, DO Allergy & Immunology  [facility]  [facility] office: [phone] [facility] office: [phone]      [1] Allergies Allergen Reactions   Penicillins Hives and Other (See Comments)    Yeast infections Yeast infections   Pt wants listed as an allergy    Zonisamide Hives

## 2024-04-07 ENCOUNTER — Ambulatory Visit: Admitting: Allergy

## 2024-04-09 ENCOUNTER — Ambulatory Visit: Payer: Self-pay | Admitting: Allergy

## 2024-04-09 ENCOUNTER — Ambulatory Visit
Admission: RE | Admit: 2024-04-09 | Discharge: 2024-04-09 | Disposition: A | Discharge status: Home / Self Care | Source: Ambulatory Visit | Attending: Allergy | Admitting: Allergy

## 2024-04-09 ENCOUNTER — Other Ambulatory Visit: Payer: Self-pay

## 2024-04-09 ENCOUNTER — Ambulatory Visit (INDEPENDENT_AMBULATORY_CARE_PROVIDER_SITE_OTHER): Admitting: Allergy

## 2024-04-09 ENCOUNTER — Encounter: Payer: Self-pay | Admitting: Allergy

## 2024-04-09 VITALS — BP 148/88 | HR 109 | Temp 98.4°F | Resp 16 | Ht 65.0 in | Wt 197.2 lb

## 2024-04-09 DIAGNOSIS — J310 Chronic rhinitis: Secondary | ICD-10-CM | POA: Diagnosis not present

## 2024-04-09 DIAGNOSIS — J9811 Atelectasis: Secondary | ICD-10-CM | POA: Diagnosis not present

## 2024-04-09 DIAGNOSIS — K219 Gastro-esophageal reflux disease without esophagitis: Secondary | ICD-10-CM

## 2024-04-09 DIAGNOSIS — B999 Unspecified infectious disease: Secondary | ICD-10-CM | POA: Diagnosis not present

## 2024-04-09 DIAGNOSIS — R051 Acute cough: Secondary | ICD-10-CM | POA: Diagnosis not present

## 2024-04-09 DIAGNOSIS — J988 Other specified respiratory disorders: Secondary | ICD-10-CM | POA: Diagnosis not present

## 2024-04-09 MED ORDER — METHYLPREDNISOLONE 4 MG PO TBPK: ORAL_TABLET | ORAL | 0 refills | Status: DC

## 2024-04-09 MED ORDER — METHYLPREDNISOLONE ACETATE 80 MG/ML IJ SUSP
80.0000 mg | Freq: Once | INTRAMUSCULAR | Status: AC
  Administered 2024-04-09: 12:00:00 80 mg via INTRAMUSCULAR

## 2024-04-09 MED ORDER — IPRATROPIUM BROMIDE 0.03 % NA SOLN: 1.0000 | Freq: Two times a day (BID) | NASAL | 5 refills | Status: AC | PRN

## 2024-04-09 NOTE — Progress Notes (Signed)
 Follow Up Note  RE: Alyssa Clay MRN: [id number] DOB: February 26, 1966 Date of Office Visit: 04/09/2024  Referring provider: Ozell Heron HERO, MD Primary care provider: Ozell Heron HERO, MD  Chief Complaint: Follow-up (She got sick x 2 days ago - coughing. She felt warm last night. )  History of Present Illness: I had the pleasure of seeing [name] for a follow up visit at the [facility] on 04/09/2024. She is a 58 y.o. female, who is being followed for  asthma on Tezspire , atelectasis, chronic rhinitis, GERD, recurrent infections . Her previous allergy office visit was on 03/06/2024 with Dr. Luke. Today is a new complaint visit of not feeling well.  Discussed the use of AI scribe software for clinical note transcription with the patient, who gave verbal consent to proceed.    Her symptoms began suddenly with a cough on Monday night, persisting for three days. The cough was initially dry and accompanied by nasal congestion and drainage. She has been using a humidifier, drinking tea, and using a nebulizer with budesonide  twice a day and albuterol . She is also on Breo and has resumed her Tezspire  injections on schedule every four weeks after her delay.   Her grandson and daughter were ill with a bad cold last week. She felt warm yesterday but did not measure her temperature and has not tested for COVID or flu. Her phlegm is described as greenish-yellow, more yellow, and she has not had a chest x-ray recently.  She experiences wheezing, shortness of breath, chest tightness, headaches, and coughing. She has been using Robitussin and continues her reflux medication and nasal spray, though she reports drainage despite its use. She has not slept well for the past two days due to coughing and wakes up with phlegm. She last used her inhaler at 9 AM today and uses the nebulizer as needed.  She has not had any other infections since November, when she was treated with a Z-Pak,  doxycycline , a Medrol  pack, and a steroid shot, which improved her symptoms for about a week.     Assessment and Plan: [name] is a 58 y.o. female with: Severe persistent asthma with acute exacerbation (HCC) Respiratory infection Past history - Spiriva  caused coughing. Normal alpha-1 level. Seen by pulmonary.Started Tezspire  on 03/31/21. Interim history - was doing okay until earlier this week and thinks she caught another respiratory infection. Been taking her regular medications more consistently and back on Tezspire .  Get tested for flu and covid. If positive, I will send in medications.  Get CXR. Start medrol  pak. Depo 80mg  given today. May take dextromethorphan  cough medicine 10mL every 4-6 hours as needed. Also over the counter. Start Trelegy 200mcg 1 puff once a day for 2 weeks. Samples given. Stop Breo while you are taking Trelegy. Daily controller medications: Breo 200mcg 1 puff once a day and rinse mouth after each use. Do the flutter valve and incentive spirometer. Continue Tezspire  injection every 4 weeks at home.  During respiratory infections/asthma flares:  Start Budesonide  0.5mg  nebulizer three times a day for 1-2 weeks until your breathing symptoms return to baseline.  Pretreat with albuterol  2 puffs or albuterol  nebulizer.  If you need to use your albuterol  nebulizer machine back to back within 15-30 minutes with no relief then please go to the ER/urgent care for further evaluation.  May use albuterol  rescue inhaler 2 puffs or nebulizer every 4 to 6 hours as needed for shortness of breath, chest tightness, coughing,  and wheezing. May use albuterol  rescue inhaler 2 puffs 5 to 15 minutes prior to strenuous physical activities. Monitor frequency of use - if you need to use it more than twice per week on a consistent basis let us  know.   Atelectasis Past history - 9/9/202 CT chest showed: 1. Prominent elevation of the right hemidiaphragm with associated right lung base  atelectasis including complete right middle lobe atelectasis. Right hemidiaphragm paralysis not excluded. Saw pulm.  Encouraged using flutter valve and incentive spirometer.    Chronic rhinitis Past history - 2019 skin testing negative. Interim history - some PND. Use Flonase  (fluticasone ) nasal spray 1-2 sprays per nostril once a day as needed for nasal congestion.  Use Atrovent  (ipratropium) 0.03% 1-2 sprays per nostril twice a day as needed for runny nose/drainage. Nasal saline spray (i.e., Simply Saline) or nasal saline lavage (i.e., NeilMed) is recommended as needed and prior to medicated nasal sprays.   Recurrent infections Past history - 2025 labs normal immunoglobulin levels, pneumococcal titers over 50% protective. Keep track of infections and antibiotics use.   Gastroesophageal reflux disease, unspecified whether esophagitis present Continue reflux lifestyle modifications and diet.  May use over the counter medication as needed.   Return in about 4 weeks (around 05/07/2024).  Meds ordered this encounter  Medications   ipratropium (ATROVENT ) 0.03 % nasal spray    Sig: Place 1-2 sprays into both nostrils 2 (two) times daily as needed (nasal drainage).    Dispense:  30 mL    Refill:  5   methylPREDNISolone  (MEDROL  DOSEPAK) 4 MG TBPK tablet    Sig: Take 6 tablets on day 1, 5 tablets on day 2, 4 tabs on day 3, 3 tabs on day 4, 2 tabs on day 5, 1 tab on day 6.    Dispense:  21 tablet    Refill:  0   methylPREDNISolone  acetate (DEPO-MEDROL ) injection 80 mg   Lab Orders  No laboratory test(s) ordered today    Diagnostics: None.   Medication List:  Current Outpatient Medications  Medication Sig Dispense Refill   albuterol  (PROVENTIL ) (2.5 MG/3ML) 0.083% nebulizer solution Take 3 mLs (2.5 mg total) by nebulization every 4 (four) hours as needed for wheezing or shortness of breath (coughing fits). 75 mL 1   albuterol  (VENTOLIN  HFA) 108 (90 Base) MCG/ACT inhaler Inhale 2 puffs  into the lungs every 4 (four) hours as needed for wheezing or shortness of breath (coughing fits). 18 g 1   BREO ELLIPTA  200-25 MCG/ACT AEPB INHALE 1 INHALATION BY MOUTH  INTO THE LUNGS DAILY RINSE MOUTH AFTER EACH USE 180 each 3   budesonide  (PULMICORT ) 0.5 MG/2ML nebulizer solution Take 2 mLs (0.5 mg total) by nebulization in the morning and at bedtime. Take for 1-2 weeks during asthma flare. 120 mL 2   buPROPion  (WELLBUTRIN  XL) 300 MG 24 hr tablet TAKE 1 TABLET BY MOUTH DAILY 90 tablet 1   celecoxib (CELEBREX) 200 MG capsule TAKE 1 CAPSULE BY MOUTH DAILY AS NEEDED . (REPLACES MELOXICAM )     dextromethorphan  15 MG/5ML syrup Take 10 mLs (30 mg total) by mouth 4 (four) times daily as needed for cough. 120 mL 0   Estradiol  (YUVAFEM ) 10 MCG TABS vaginal tablet Place 1 tablet (10 mcg total) vaginally daily. 8 tablet 0   ipratropium (ATROVENT ) 0.03 % nasal spray Place 1-2 sprays into both nostrils 2 (two) times daily as needed (nasal drainage). 30 mL 5   Lasmiditan  Succinate (REYVOW ) 100 MG TABS Take 1 tablet (  100 mg total) by mouth daily as needed. 4 tablet 1   methylPREDNISolone  (MEDROL  DOSEPAK) 4 MG TBPK tablet Take 6 tablets on day 1, 5 tablets on day 2, 4 tabs on day 3, 3 tabs on day 4, 2 tabs on day 5, 1 tab on day 6. 21 tablet 0   Multiple Vitamin (MULTIVITAMIN WITH MINERALS) TABS tablet Take 1 tablet by mouth daily.     ondansetron  (ZOFRAN ) 4 MG tablet Take 1 tablet (4 mg total) by mouth every 8 (eight) hours as needed for nausea or vomiting. 30 tablet 1   Respiratory Therapy Supplies (FLUTTER) DEVI Use 3 times daily as directed 1 each 0   rizatriptan  (MAXALT ) 10 MG tablet Take 1 tablet (10 mg total) by mouth as needed for migraine. May repeat in 2 hours if needed.  Maximum 2 tablets in 24 hours. 10 tablet 5   Tezepelumab -ekko (TEZSPIRE ) 210 MG/1. SOAJ Inject 210 mg into the skin every 28 (twenty-eight) days. 1.91 mL 11   traZODone  (DESYREL ) 100 MG tablet TAKE 1 TO 2 TABLETS BY MOUTH AT   BEDTIME AS NEEDED FOR SLEEP 90 tablet 7   tretinoin (RETIN-A) 0.05 % cream Apply topically.     valACYclovir  (VALTREX ) 1000 MG tablet Take 1 tablet (1,000 mg total) by mouth 2 (two) times daily. 14 tablet 5   ZEPBOUND  15 MG/0.5ML Pen Inject 15 mg into the skin once a week.     DULoxetine  (CYMBALTA ) 60 MG capsule TAKE 1 CAPSULE BY MOUTH DAILY (Patient not taking: Reported on 04/09/2024) 90 capsule 3   meloxicam  (MOBIC ) 7.5 MG tablet Take 1 tablet (7.5 mg total) by mouth daily. (Patient not taking: Reported on 04/09/2024) 90 tablet 1   Spacer/Aero-Holding Chambers (AEROCHAMBER MV) inhaler Use as instructed (Patient not taking: Reported on 04/09/2024) 1 each 1   Current Facility-Administered Medications  Medication Dose Route Frequency Provider Last Rate Last Admin   tezepelumab -ekko (TEZSPIRE ) 210 MG/1. syringe 210 mg  210 mg Subcutaneous Q28 days Luke Orlan HERO, DO   210 mg at 08/19/21 1138   Allergies: Allergies[1] I reviewed her past medical history, social history, family history, and environmental history and no significant changes have been reported from her previous visit.  Review of Systems  Constitutional:  Negative for appetite change, chills, fever and unexpected weight change.  HENT:  Positive for congestion. Negative for rhinorrhea.   Eyes:  Negative for itching.  Respiratory:  Positive for cough, chest tightness, shortness of breath and wheezing.   Gastrointestinal:  Negative for abdominal pain.  Skin:  Negative for rash.  Allergic/Immunologic: Positive for environmental allergies.  Neurological:  Positive for headaches.    Objective: BP (!) 148/88 (BP Location: Left Arm, Patient Position: Sitting, Cuff Size: Normal)   Pulse (!) 109   Temp 98.4 F (36.9 C) (Temporal)   Resp 16   Ht 5' 5 (1.651 m)   Wt 197 lb 3.2 oz (89.4 kg)   LMP 12/20/2018   SpO2 97%   BMI 32.82 kg/m  Body mass index is 32.82 kg/m. Physical Exam Vitals and nursing note reviewed.   Constitutional:      Appearance: Normal appearance. She is well-developed.  HENT:     Head: Normocephalic and atraumatic.     Right Ear: Tympanic membrane and external ear normal.     Left Ear: Tympanic membrane and external ear normal.     Nose: Nose normal.     Mouth/Throat:     Mouth: Mucous membranes are moist.  Pharynx: Oropharynx is clear.  Eyes:     Conjunctiva/sclera: Conjunctivae normal.  Cardiovascular:     Rate and Rhythm: Normal rate and regular rhythm.     Heart sounds: Normal heart sounds. No murmur heard. Pulmonary:     Effort: Pulmonary effort is normal.     Breath sounds: Wheezing present. No rhonchi or rales.  Musculoskeletal:     Cervical back: Neck supple.  Skin:    General: Skin is warm.     Findings: No rash.  Neurological:     Mental Status: She is alert and oriented to person, place, and time.  Psychiatric:        Behavior: Behavior normal.    Previous notes and tests were reviewed. The plan was reviewed with the patient/family, and all questions/concerned were addressed.  It was my pleasure to see Raevin today and participate in her care. Please feel free to contact me with any questions or concerns.  Sincerely,  Orlan Cramp, DO Allergy & Immunology  [facility]  [place] office: [phone] [facility] office: [phone]     [1]  Allergies Allergen Reactions   Penicillins Hives and Other (See Comments)    Yeast infections Yeast infections   Pt wants listed as an allergy    Zonisamide Hives

## 2024-04-09 NOTE — Patient Instructions (Addendum)
 Respiratory infection Get tested for flu and covid. If positive, I will send in medications.  Get CXR.  Start medrol  pak Steroid injection given today.  May take dextromethorphan  cough medicine 10mL every 4-6 hours as needed. Also over the counter.  Asthma/atelectasis Start Trelegy 200mcg 1 puff once a day for 2 weeks. Samples given. Stop Breo while you are taking Trelegy.  Daily controller medications: Breo 200mcg 1 puff once a day and rinse mouth after each use. Do the flutter valve and incentive spirometer. Continue Tezspire  injection every 4 weeks at home.  During respiratory infections/asthma flares:  Start Budesonide  0.5mg  nebulizer three times a day for 1-2 weeks until your breathing symptoms return to baseline.  Pretreat with albuterol  2 puffs or albuterol  nebulizer.  If you need to use your albuterol  nebulizer machine back to back within 15-30 minutes with no relief then please go to the ER/urgent care for further evaluation.  May use albuterol  rescue inhaler 2 puffs or nebulizer every 4 to 6 hours as needed for shortness of breath, chest tightness, coughing, and wheezing. May use albuterol  rescue inhaler 2 puffs 5 to 15 minutes prior to strenuous physical activities. Monitor frequency of use - if you need to use it more than twice per week on a consistent basis let us  know.  Asthma control goals:  Full participation in all desired activities (may need albuterol  before activity) Albuterol  use two times or less a week on average (not counting use with activity) Cough interfering with sleep two times or less a month Oral steroids no more than once a year No hospitalizations.  Infections Keep track of infections and antibiotics use.  Chronic rhinitis Use Flonase  (fluticasone ) nasal spray 1-2 sprays per nostril once a day as needed for nasal congestion.  Use Atrovent  (ipratropium) 0.03% 1-2 sprays per nostril twice a day as needed for runny nose/drainage. Nasal saline spray  (i.e., Simply Saline) or nasal saline lavage (i.e., NeilMed) is recommended as needed and prior to medicated nasal sprays.   Gastroesophageal reflux disease Continue reflux lifestyle modifications and diet.  May use over the counter medication as needed.   Follow up in 1 months or sooner if needed.   Drink plenty of fluids. Water, juice, clear broth or warm lemon water are good choices. Avoid caffeine and alcohol, which can dehydrate you. Eat chicken soup. Chicken soup and other warm fluids can be soothing and loosen congestion. Rest. Adjust your room's temperature and humidity. Keep your room warm but not overheated. If the air is dry, a cool-mist humidifier or vaporizer can moisten the air and help ease congestion and coughing. Keep the humidifier clean to prevent the growth of bacteria and molds. Soothe your throat. Perform a saltwater gargle. Dissolve one-quarter to a half teaspoon of salt in a 4- to 8-ounce glass of warm water. This can relieve a sore or scratchy throat temporarily. Use saline nasal drops. To help relieve nasal congestion, try saline nasal drops. You can buy these drops over the counter, and they can help relieve symptoms ? even in children. Take over-the-counter cold and cough medications. For adults and children older than 5, over-the-counter decongestants, antihistamines and pain relievers might offer some symptom relief. However, they won't prevent a cold or shorten its duration.

## 2024-04-11 ENCOUNTER — Telehealth: Payer: Self-pay | Admitting: Allergy

## 2024-04-11 NOTE — Telephone Encounter (Signed)
 PT called to ask about Rx refills from visit the other day, needed for nebulizer to get through the weekend  Need flutter valve (husband out looking for one)  Call back [phone] Phx is [facility] [place]

## 2024-04-11 NOTE — Progress Notes (Deleted)
 "  NEUROLOGY FOLLOW UP OFFICE NOTE  Alyssa Clay [id number]  Assessment/Plan:   Migraine without aura, without status migrainosus, not intractable Right vestibular neuritis, resolved   Migraine prevention:  Vyepti  to 300mg .  Continue PT/dry needling, yoga, exercise, massage Preemptively take a Reyvow  prior to Vyepti  infusion to prevent post infusion migraine. Migraine rescue:  Reyvow .  As she is only allotted 4 a month, will prescribe again rizatriptan  10mg ; Zofran  for nausea Limit use of pain relievers to no more than 9 days out of the month to prevent risk of rebound or medication-overuse headache. Keep headache diary Follow up 6 months.  Subjective:  Alyssa Clay is a 58 year old female with asthma who follows up for migraine and vertigo.  History supplemented by ENTnotes and accompanying husband.     UPDATE: Migraines: Advised to take Reyvow  preemptively before the Vyepti  infusion.  ***  Intensity:  8-10/10 Duration:  4-6 hours with Reyvow  or 1 day with rizatriptan  as she had leftover (previously they lasted 2-3 days) Frequency:  9 to 10 days a month (down from 5 days a week)   Medications: Frequency of abortive medication: as needed Current NSAIDS/analgesics:  none Current triptans:  none Current ergotamine: none Current anti-emetic:  Zofran  4mg  Current muscle relaxants:  none Current Antihypertensive medications:  none Current Antidepressant medications:  Cymbalta  60mg  daily, Wellbutrin , trazodone  Current Anticonvulsant medications:  none Current anti-CGRP:  Vypeti 300mg  Current Vitamins/Herbal/Supplements:  none Current Antihistamines/Decongestants:  none Other therapy:  Reyvow  100mg ; acupuncture birth control/hormone:  estradiol  Other treatment:  physical therapy/neck stretching, dry needling, massage, yoga, gym, mouth guard     HISTORY: Migraines: Onset:  27 or 58 years old with onset of menses menstrual until 55 and then more often.  Pregnant at 27 -   Location:  frontal, temporal, top of head bilaterally, now often right periorbital, right jaw  Quality:  squeezing, stabbing Intensity:  moderate to severe Aura:  sometimes zigzag pattern in visioin Prodrome:  absent Associated symptoms:  Photophobia, phonophobia, osmophobia.  She denies associated nausea, vomiting, autonomic symptoms, unilateral numbness or weakness. Duration:  1 to 3 days Frequency:  5 to 12 a month Triggers:  bright lights, stress Relieving factors:  rest, dark room Activity:  aggravates   MRI of brain without contrast on 10/14/2012 was normal     Vertigo - suspect right vestibular neuritis: In early April 2024, she developed a viral ilness, including low-grade fever, sore throat, and headache.  A couple of days, she developed dizziness, described as a spinning sensation triggered by change in position.  Labs for COVID, flu and strep A were negative.  She was prescribed meclizine  and given instructions for home Epley maneuver which were ineffective.  She saw PT.  Exam revealed right vestibular hypofunction, including positive right Head Impulse Test.  She went to two sessions of vestibular rehab with PT which were not helpful.  Always experiences sensation of dizziness but dull when still, aggravated with movement.  Associated nausea.  Denies aural fullness, otalgia, tinnitus or hearing loss.  She saw ENT (Dr. Herminio) in July 2024.  Nasal endoscopy was unremarkable.  VNG performed on 11/21/2022 showed abnormal saccades and smooth pursuit but she did not demonstrate nystagmus or subjective dizziness and optokinetic testing, positional testing and calorics were normal.  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  There was no indication of BPPV.  MRI of  brain/IAC with and without contrast on 11/24/2022 personally reviewed was normal.  Symptoms subsequently resolved.   Past medications: Past NSAIDS/analgesics:  ibuprofen , Tylenol , naproxen , Fioricet, Codeine , darvocet, Norco, Tylenol  #3, tramadol, Midrin, toradol , Elyxyb Past abortive triptans:  Amerge, Frova, Relpax, sumatriptan  tab/[place], Maxalt , Zomig, Treximet Past abortive ergotamine: Trudhesa Past muscle relaxants:  tizanidine, skelaxin, Flexeril Past anti-emetic:  promethazine , Compazine Past antihypertensive medications:  propranolol, atenolol, verapamil, metoprolol Past antidepressant medications:  amitriptyline, sertraline, Lexapro, Celexa, Prozac Past anticonvulsant medications:  topiramate, Depakote, zonisamide, gabapentin  Past anti-CGRP:  Qulipta, Ubrelvy, Nurtec, Aimovig, Zavzpret Past vitamins/Herbal/Supplements:  Migrelief BID Past antihistamines/decongestants:  none Other past therapies:  Botox (15 years ago, lost efficacy), sphenopalatine ganglion block, trigger point injections, acupuncture  PAST MEDICAL HISTORY: Past Medical History:  Diagnosis Date   Allergy    Anemia    Asthma    Constipation    issues x the past month -? 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 75 mL 1   albuterol  (VENTOLIN  HFA) 108 (90 Base) MCG/ACT inhaler Inhale 2 puffs into the lungs every 4 (four) hours as needed for wheezing or shortness of breath  (coughing fits). 18 g 1   BREO ELLIPTA  200-25 MCG/ACT AEPB INHALE 1 INHALATION BY MOUTH  INTO THE LUNGS DAILY RINSE MOUTH AFTER EACH USE 180 each 3   budesonide  (PULMICORT ) 0.5 MG/2ML nebulizer solution Take 2 mLs (0.5 mg total) by nebulization in the morning and at bedtime. Take for 1-2 weeks during asthma flare. 120 mL 2   buPROPion  (WELLBUTRIN  XL) 300 MG 24 hr tablet TAKE 1 TABLET BY MOUTH DAILY 90 tablet 1   celecoxib (CELEBREX) 200 MG capsule TAKE 1 CAPSULE BY MOUTH DAILY AS NEEDED . (REPLACES MELOXICAM )     dextromethorphan  15 MG/5ML syrup Take 10 mLs (30 mg total) by mouth 4 (four) times daily as needed for cough. 120 mL 0   DULoxetine  (CYMBALTA ) 60 MG capsule TAKE 1 CAPSULE BY MOUTH DAILY (Patient not taking: Reported on 04/09/2024) 90 capsule 3   Estradiol  (YUVAFEM ) 10 MCG TABS vaginal tablet Place 1 tablet (10 mcg total) vaginally daily. 8 tablet 0   ipratropium (ATROVENT ) 0.03 % nasal spray Place 1-2 sprays into both nostrils 2 (two) times daily as needed (nasal drainage). 30 mL 5   Lasmiditan  Succinate (REYVOW ) 100 MG TABS Take 1 tablet (100 mg total) by mouth daily as needed. 4 tablet 1   meloxicam  (MOBIC ) 7.5 MG tablet Take 1 tablet (7.5 mg total) by mouth daily. (Patient not taking: Reported on 04/09/2024) 90 tablet 1   methylPREDNISolone  (MEDROL  DOSEPAK) 4 MG TBPK tablet Take 6 tablets on day 1, 5 tablets on day 2, 4 tabs on day 3, 3 tabs on day 4, 2 tabs on day 5, 1 tab on day 6. 21 tablet 0  Multiple Vitamin (MULTIVITAMIN WITH MINERALS) TABS tablet Take 1 tablet by mouth daily.     ondansetron  (ZOFRAN ) 4 MG tablet Take 1 tablet (4 mg total) by mouth every 8 (eight) hours as needed for nausea or vomiting. 30 tablet 1   Respiratory Therapy Supplies (FLUTTER) DEVI Use 3 times daily as directed 1 each 0   rizatriptan  (MAXALT ) 10 MG tablet Take 1 tablet (10 mg total) by mouth as needed for migraine. May repeat in 2 hours if needed.  Maximum 2 tablets in 24 hours. 10 tablet 5    Spacer/Aero-Holding Chambers (AEROCHAMBER MV) inhaler Use as instructed (Patient not taking: Reported on 04/09/2024) 1 each 1   Tezepelumab -ekko (TEZSPIRE ) 210 MG/1. SOAJ Inject 210 mg into the skin every 28 (twenty-eight) days. 1.91 mL 11   traZODone  (DESYREL ) 100 MG tablet TAKE 1 TO 2 TABLETS BY MOUTH AT  BEDTIME AS NEEDED FOR SLEEP 90 tablet 7   tretinoin (RETIN-A) 0.05 % cream Apply topically.     valACYclovir  (VALTREX ) 1000 MG tablet Take 1 tablet (1,000 mg total) by mouth 2 (two) times daily. 14 tablet 5   ZEPBOUND  15 MG/0.5ML Pen Inject 15 mg into the skin once a week.     Current Facility-Administered Medications on File Prior to Visit  Medication Dose Route Frequency Provider Last Rate Last Admin   tezepelumab -ekko (TEZSPIRE ) 210 MG/1. syringe 210 mg  210 mg Subcutaneous Q28 days Luke Orlan HERO, DO   210 mg at 08/19/21 1138    ALLERGIES: Allergies  Allergen Reactions   Penicillins Hives and Other (See Comments)    Yeast infections Yeast infections   Pt wants listed as an allergy    Zonisamide Hives    FAMILY HISTORY: Family History  Problem Relation Age of Onset   Depression Mother    Lymphoma Mother 71   Lung cancer Mother    Hypertension Father    Heart attack Father 22   Hypertension Brother    High blood pressure Brother    High Cholesterol Brother    Healthy Daughter    Colon cancer Maternal Aunt 29   Esophageal cancer Neg Hx    Pancreatic cancer Neg Hx    Stomach cancer Neg Hx    Colon polyps Neg Hx    Rectal cancer Neg Hx       Objective:  *** General: No acute distress.  Patient appears well-groomed.   Head:  Normocephalic/atraumatic Eyes:  Fundi examined but not visualized Neck: supple, no paraspinal tenderness, full range of motion Heart:  Regular rate and rhythm Neurological Exam: alert and oriented.  Speech fluent and not dysarthric, language intact.  CN II-XII intact. Bulk and tone normal, muscle strength 5/5 throughout.  Sensation to  light touch intact.  Deep tendon reflexes 2+ throughout, toes downgoing.  Finger to nose testing intact.  Gait normal, Romberg negative.   Juliene Dunnings, DO  CC: Heron Sharper, MD       "

## 2024-04-14 ENCOUNTER — Other Ambulatory Visit: Payer: Self-pay

## 2024-04-14 ENCOUNTER — Encounter: Payer: Self-pay | Admitting: Neurology

## 2024-04-14 ENCOUNTER — Emergency Department (HOSPITAL_BASED_OUTPATIENT_CLINIC_OR_DEPARTMENT_OTHER)
Admission: EM | Admit: 2024-04-14 | Discharge: 2024-04-14 | Disposition: A | Discharge status: Home / Self Care | Attending: Emergency Medicine | Admitting: Emergency Medicine

## 2024-04-14 ENCOUNTER — Emergency Department (HOSPITAL_BASED_OUTPATIENT_CLINIC_OR_DEPARTMENT_OTHER): Admitting: Radiology

## 2024-04-14 ENCOUNTER — Ambulatory Visit: Admitting: Neurology

## 2024-04-14 DIAGNOSIS — R059 Cough, unspecified: Secondary | ICD-10-CM | POA: Insufficient documentation

## 2024-04-14 DIAGNOSIS — R0602 Shortness of breath: Secondary | ICD-10-CM | POA: Insufficient documentation

## 2024-04-14 LAB — CBC
HCT: 39.6 % (ref 36.0–46.0)
Hemoglobin: 13.4 g/dL (ref 12.0–15.0)
MCH: 30.7 pg (ref 26.0–34.0)
MCHC: 33.8 g/dL (ref 30.0–36.0)
MCV: 90.6 fL (ref 80.0–100.0)
Platelets: 337 K/uL (ref 150–400)
RBC: 4.37 MIL/uL (ref 3.87–5.11)
RDW: 13.5 % (ref 11.5–15.5)
WBC: 9.7 K/uL (ref 4.0–10.5)
nRBC: 0 % (ref 0.0–0.2)

## 2024-04-14 LAB — BASIC METABOLIC PANEL WITH GFR
Anion gap: 11 (ref 5–15)
BUN: 10 mg/dL (ref 6–20)
CO2: 27 mmol/L (ref 22–32)
Calcium: 9.7 mg/dL (ref 8.9–10.3)
Chloride: 103 mmol/L (ref 98–111)
Creatinine, Ser: 0.86 mg/dL (ref 0.44–1.00)
GFR, Estimated: >60 mL/min
Glucose, Bld: 88 mg/dL (ref 70–99)
Potassium: 3.4 mmol/L — ABNORMAL LOW (ref 3.5–5.1)
Sodium: 141 mmol/L (ref 135–145)

## 2024-04-14 LAB — RESP PANEL BY RT-PCR (RSV, FLU A&B, COVID)  RVPGX2
Influenza A by PCR: NEGATIVE
Influenza B by PCR: NEGATIVE
Resp Syncytial Virus by PCR: NEGATIVE
SARS Coronavirus 2 by RT PCR: NEGATIVE

## 2024-04-14 MED ORDER — DEXAMETHASONE SOD PHOSPHATE PF 10 MG/ML IJ SOLN
10.0000 mg | Freq: Once | INTRAMUSCULAR | Status: AC
  Administered 2024-04-14: 10 mg via INTRAVENOUS

## 2024-04-14 MED ORDER — BENZONATATE 100 MG PO CAPS: 100.0000 mg | ORAL_CAPSULE | Freq: Three times a day (TID) | ORAL | 0 refills | Status: AC

## 2024-04-14 MED ORDER — IPRATROPIUM-ALBUTEROL 0.5-2.5 (3) MG/3ML IN SOLN
3.0000 mL | Freq: Once | RESPIRATORY_TRACT | Status: AC
  Administered 2024-04-14: 3 mL via RESPIRATORY_TRACT
  Filled 2024-04-14: qty 3

## 2024-04-14 MED ORDER — ALBUTEROL SULFATE (2.5 MG/3ML) 0.083% IN NEBU: 2.5000 mg | INHALATION_SOLUTION | RESPIRATORY_TRACT | 1 refills | Status: AC | PRN

## 2024-04-14 MED ORDER — KETOROLAC TROMETHAMINE 15 MG/ML IJ SOLN
15.0000 mg | Freq: Once | INTRAMUSCULAR | Status: AC
  Administered 2024-04-14: 15 mg via INTRAVENOUS
  Filled 2024-04-14: qty 1

## 2024-04-14 NOTE — ED Notes (Signed)
 Attempted blood draw twice unsuccessful.

## 2024-04-14 NOTE — ED Triage Notes (Signed)
 Pt w/ h/o Asthma reports cough and SOB for past week.  Pt seen by pmd 1 week ago and chest xray showed atelectasis.  Pt has been taking OTC and nebulizer tx at home with no relief.  SOB worse with coughing fit or activity.

## 2024-04-14 NOTE — Discharge Instructions (Signed)
 Continue your inhalers as previously directed.  I have provided you with a refill of your albuterol  nebulizer solution, use as directed.  Start Tessalon  Perles and use every 8 hours as needed for cough.  Follow-up with your PCP and allergy/asthma specialist as scheduled.  Return to the emergency department if your symptoms worsen.

## 2024-04-14 NOTE — ED Provider Notes (Signed)
 " [place] EMERGENCY DEPARTMENT AT [facility] Provider Note   CSN: [id number] Arrival date & time: 04/14/24  9042     Patient presents with: Shortness of Breath   Alyssa Clay is a 58 y.o. female.   58 year old female presenting with cough/shortness of breath.  Patient notes that symptoms been ongoing for over 1 week, she saw her allergy/asthma doctor last week and was told that she had some atelectasis on her chest x-ray, she has been using her albuterol  inhaler/nebulizer at home with minimal relief of her symptoms.  She has also tried over-the-counter cough medications and ibuprofen  with some relief.  Patient reports subjective fever at home last week, none since.  Reports chest discomfort only with episodes of coughing.  At her appointment 12/17 patient was given Medrol  Dosepak and started on Trelegy. Patient checked her pulse ox at home and reports a reading of 83% earlier today.    Shortness of Breath      Prior to Admission medications  Medication Sig Start Date End Date Taking? Authorizing Provider  albuterol  (PROVENTIL ) (2.5 MG/3ML) 0.083% nebulizer solution Take 3 mLs (2.5 mg total) by nebulization every 4 (four) hours as needed for wheezing or shortness of breath (coughing fits). 12/18/22   Luke Orlan HERO, DO  albuterol  (VENTOLIN  HFA) 108 (90 Base) MCG/ACT inhaler Inhale 2 puffs into the lungs every 4 (four) hours as needed for wheezing or shortness of breath (coughing fits). 03/06/24   Luke Orlan HERO, DO  BREO ELLIPTA  200-25 MCG/ACT AEPB INHALE 1 INHALATION BY MOUTH  INTO THE LUNGS DAILY RINSE MOUTH AFTER EACH USE 05/16/23   Luke Orlan HERO, DO  budesonide  (PULMICORT ) 0.5 MG/2ML nebulizer solution Take 2 mLs (0.5 mg total) by nebulization in the morning and at bedtime. Take for 1-2 weeks during asthma flare. 12/18/22   Luke Orlan HERO, DO  buPROPion  (WELLBUTRIN  XL) 300 MG 24 hr tablet TAKE 1 TABLET BY MOUTH DAILY 11/29/23   Ozell Heron HERO, MD  celecoxib (CELEBREX) 200 MG  capsule TAKE 1 CAPSULE BY MOUTH DAILY AS NEEDED . (REPLACES MELOXICAM ) 12/27/23   [provider]  dextromethorphan  15 MG/5ML syrup Take 10 mLs (30 mg total) by mouth 4 (four) times daily as needed for cough. 03/06/24   Luke Orlan HERO, DO  DULoxetine  (CYMBALTA ) 60 MG capsule TAKE 1 CAPSULE BY MOUTH DAILY Patient not taking: Reported on 04/09/2024 07/18/23   Ozell Heron HERO, MD  Estradiol  (YUVAFEM ) 10 MCG TABS vaginal tablet Place 1 tablet (10 mcg total) vaginally daily. 08/30/20   Koberlein, Junell C, MD  ipratropium (ATROVENT ) 0.03 % nasal spray Place 1-2 sprays into both nostrils 2 (two) times daily as needed (nasal drainage). 04/09/24   Luke Orlan HERO, DO  Lasmiditan  Succinate (REYVOW ) 100 MG TABS Take 1 tablet (100 mg total) by mouth daily as needed. 02/25/24   Skeet, Adam R, DO  meloxicam  (MOBIC ) 7.5 MG tablet Take 1 tablet (7.5 mg total) by mouth daily. Patient not taking: Reported on 04/09/2024 01/10/24   Ozell Heron HERO, MD  methylPREDNISolone  (MEDROL  DOSEPAK) 4 MG TBPK tablet Take 6 tablets on day 1, 5 tablets on day 2, 4 tabs on day 3, 3 tabs on day 4, 2 tabs on day 5, 1 tab on day 6. 04/09/24   Luke Orlan HERO, DO  Multiple Vitamin (MULTIVITAMIN WITH MINERALS) TABS tablet Take 1 tablet by mouth daily.    [provider]  ondansetron  (ZOFRAN ) 4 MG tablet Take 1 tablet (4 mg total) by  mouth every 8 (eight) hours as needed for nausea or vomiting. 08/30/23   Ozell Heron HERO, MD  Respiratory Therapy Supplies (FLUTTER) DEVI Use 3 times daily as directed 01/22/19   Mannam, Praveen, MD  rizatriptan  (MAXALT ) 10 MG tablet Take 1 tablet (10 mg total) by mouth as needed for migraine. May repeat in 2 hours if needed.  Maximum 2 tablets in 24 hours. 09/20/23   Skeet Juliene SAUNDERS, DO  Spacer/Aero-Holding Chambers (AEROCHAMBER MV) inhaler Use as instructed Patient not taking: Reported on 04/09/2024 04/28/22   Van Knee, MD  Tezepelumab -ekko (TEZSPIRE ) 210 MG/1. SOAJ Inject 210 mg into the skin  every 28 (twenty-eight) days. 01/03/23   Luke Orlan HERO, DO  traZODone  (DESYREL ) 100 MG tablet TAKE 1 TO 2 TABLETS BY MOUTH AT  BEDTIME AS NEEDED FOR SLEEP 03/10/24   Ozell Heron HERO, MD  tretinoin (RETIN-A) 0.05 % cream Apply topically. 03/30/20   [provider]  valACYclovir  (VALTREX ) 1000 MG tablet Take 1 tablet (1,000 mg total) by mouth 2 (two) times daily. 06/22/22   Ozell Heron HERO, MD  ZEPBOUND  15 MG/0.5ML Pen Inject 15 mg into the skin once a week. 10/07/23   [provider]    Allergies: Penicillins and Zonisamide    Review of Systems  Respiratory:  Positive for shortness of breath.     Updated Vital Signs  Vitals:   04/14/24 1016 04/14/24 1230 04/14/24 1300 04/14/24 1341  BP: (!) 152/88 135/79 138/63   Pulse: 96 84 89   Resp: 18  18   Temp: 97.9 F (36.6 C)     SpO2: 97% 97% 96% 97%     Physical Exam Vitals and nursing note reviewed.  HENT:     Head: Normocephalic.  Eyes:     Extraocular Movements: Extraocular movements intact.  Cardiovascular:     Rate and Rhythm: Normal rate and regular rhythm.  Pulmonary:     Effort: Pulmonary effort is normal. No respiratory distress.     Comments: Minimal expiratory wheezing noted Abdominal:     Palpations: Abdomen is soft.     Tenderness: There is no abdominal tenderness. There is no guarding.  Musculoskeletal:     Cervical back: Normal range of motion.     Right lower leg: No edema.     Left lower leg: No edema.     Comments: Moves all extremities spontaneously without difficulty  Skin:    General: Skin is warm and dry.  Neurological:     Mental Status: She is alert and oriented to person, place, and time.     (all labs ordered are listed, but only abnormal results are displayed) Labs Reviewed  BASIC METABOLIC PANEL WITH GFR - Abnormal; Notable for the following components:      Result Value   Potassium 3.4 (*)    All other components within normal limits  RESP PANEL BY RT-PCR (RSV, FLU A&B,  COVID)  [id number]  CBC    EKG: EKG Interpretation Date/Time:  Monday April 14 2024 10:31:52 EST Ventricular Rate:  90 PR Interval:  148 QRS Duration:  84 QT Interval:  336 QTC Calculation: 411 R Axis:   34  Text Interpretation: Normal sinus rhythm poor baseline No significant change since last tracing Confirmed by Jerrol Agent ([id number]) on 04/14/2024 1:25:31 PM  Radiology: DG Chest 2 View Result Date: 04/14/2024 EXAM: 2 VIEW(S) XRAY OF THE CHEST 04/14/2024 11:02:00 AM COMPARISON: 04/09/2024 CLINICAL HISTORY: SOB FINDINGS: LUNGS AND PLEURA: Increased linear subsegmental atelectasis or scarring  at left lung base. Stable elevated right hemidiaphragm. No pleural effusion. No pneumothorax. HEART AND MEDIASTINUM: No acute abnormality of the cardiac and mediastinal silhouettes. BONES AND SOFT TISSUES: No acute osseous abnormality. IMPRESSION: 1. Increased linear subsegmental atelectasis or scarring at left lung base. 2. Stable elevated right hemidiaphragm. Electronically signed by: Evalene Coho MD 04/14/2024 11:32 AM EST RP Workstation: [id number]     Procedures   Medications Ordered in the ED  ipratropium-albuterol  (DUONEB) 0.5-2.5 (3) MG/3ML nebulizer solution 3 mL (3 mLs Nebulization Given 04/14/24 1249)  dexamethasone  (DECADRON ) injection 10 mg (10 mg Intravenous Given 04/14/24 1250)  ketorolac  (TORADOL ) 15 MG/ML injection 15 mg (15 mg Intravenous Given 04/14/24 1250)  ipratropium-albuterol  (DUONEB) 0.5-2.5 (3) MG/3ML nebulizer solution 3 mL (3 mLs Nebulization Given 04/14/24 1341)                                    Medical Decision Making This patient presents to the ED for concern of cough/shortness of breath, this involves an extensive number of treatment options, and is a complaint that carries with it a high risk of complications and morbidity.  The differential diagnosis includes COVID/flu/RSV, asthma exacerbation, PNA   Co morbidities that complicate the patient  evaluation  Asthma    Additional history obtained:  Additional history obtained from record review External records from outside source obtained and reviewed including recent note from allergy/asthma provider   Lab Tests:  I Ordered, and personally interpreted labs.  The pertinent results include: CBC within normal limits, no leukocytosis. BMP with borderline hypokalemia at 3.4, otherwise unremarkable. Respiratory viral panel negative.   Imaging Studies ordered:  I ordered imaging studies including CXR  I independently visualized and interpreted imaging which showed 1. Increased linear subsegmental atelectasis or scarring at left lung base. 2. Stable elevated right hemidiaphragm.  I agree with the radiologist interpretation   Cardiac Monitoring: / EKG:  The patient was maintained on a cardiac monitor.  I personally viewed and interpreted the cardiac monitored which showed an underlying rhythm of: NSR   Problem List / ED Course / Critical interventions / Medication management   I ordered medication including toradol  for bodyaches, DuoNeb x 2/Decadron  for wheezing/SOB/asthma exacerbation  Reevaluation of the patient after these medicines showed that the patient improved I have reviewed the patients home medicines and have made adjustments as needed   Test / Admission - Considered:  Physical exam is largely unremarkable as above, patient does have minimal wheezing on exam but is not demonstrating any signs of respiratory distress, she is able to speak in full/clear sentences.  Patient reports episode of hypoxia at home on her home pulse oximeter, however in the emergency department today she is maintaining her oxygen  saturation at > 97% on room air, there were no desaturations during my examination including during episodes of coughing. Will reassess after administration of steroids and DuoNeb.  Patient's lung exam does sound improved after administration of first DuoNeb, she  reports feeling that her cough/shortness of breath has improved as well.  Will repeat additional DuoNeb and reassess. After second DuoNeb, patient's lung exam continues to sound reassuring. She is not demonstrating any signs of respiratory distress, and her pulse oximetry remains reassuring. At this time I do not feel that any additional intervention is necessary, patient requests a refill of her albuterol  nebulized solution as she is almost completely out at home, will refill this medication as well  as Tessalon  Perles to be used as needed for cough.  Strict return precautions discussed, patient voiced understanding and is in agreement this plan, she is appropriate for discharge at this time.     Amount and/or Complexity of Data Reviewed Labs: ordered. Radiology: ordered.  Risk Prescription drug management.        Final diagnoses:  Cough, unspecified type    ED Discharge Orders          Ordered    albuterol  (PROVENTIL ) (2.5 MG/3ML) 0.083% nebulizer solution  Every 4 hours PRN        04/14/24 1405    benzonatate  (TESSALON ) 100 MG capsule  Every 8 hours        04/14/24 1405               Glendia Rocky SAILOR, [place] 04/14/24 1413    Jerrol Agent, MD 04/14/24 1800  "

## 2024-04-15 ENCOUNTER — Encounter: Payer: Self-pay | Admitting: Allergy

## 2024-04-15 ENCOUNTER — Other Ambulatory Visit: Payer: Self-pay | Admitting: *Deleted

## 2024-04-15 MED ORDER — BUDESONIDE 0.5 MG/2ML IN SUSP: 0.5000 mg | Freq: Three times a day (TID) | RESPIRATORY_TRACT | 2 refills | Status: AC

## 2024-04-15 MED ORDER — ALBUTEROL SULFATE (2.5 MG/3ML) 0.083% IN NEBU: 2.5000 mg | INHALATION_SOLUTION | Freq: Four times a day (QID) | RESPIRATORY_TRACT | 1 refills | Status: AC | PRN

## 2024-04-15 MED ORDER — TEZSPIRE 210 MG/1.91ML ~~LOC~~ SOAJ: 210.0000 mg | SUBCUTANEOUS | 11 refills | Status: AC

## 2024-04-15 NOTE — Telephone Encounter (Signed)
 I called the patient. She said she had to go to the ER yesterday due to her cough. She said she sprained a muscle in her back from coughing bad.She is taking motrin  but it is no help. Informed per last AVS she is to take the budesonide  neb for 1-2 weeks. Informed rx refill of Budesonide  and Albuterol  have been sent in.

## 2024-04-16 ENCOUNTER — Other Ambulatory Visit: Payer: Self-pay

## 2024-04-21 ENCOUNTER — Other Ambulatory Visit: Payer: Self-pay | Admitting: Family Medicine

## 2024-04-21 DIAGNOSIS — F3341 Major depressive disorder, recurrent, in partial remission: Secondary | ICD-10-CM

## 2024-04-22 ENCOUNTER — Encounter: Payer: Self-pay | Admitting: Allergy

## 2024-04-23 MED ORDER — NYSTATIN 100000 UNIT/ML MT SUSP: 5.0000 mL | Freq: Four times a day (QID) | OROMUCOSAL | 0 refills | Status: AC

## 2024-04-23 NOTE — Telephone Encounter (Signed)
 Rx listed as historical med.

## 2024-04-28 ENCOUNTER — Encounter: Payer: Self-pay | Admitting: Family Medicine

## 2024-04-28 ENCOUNTER — Ambulatory Visit (INDEPENDENT_AMBULATORY_CARE_PROVIDER_SITE_OTHER): Admitting: Family Medicine

## 2024-04-28 VITALS — BP 98/60 | HR 100 | Temp 98.0°F | Ht 65.0 in | Wt 192.4 lb

## 2024-04-28 DIAGNOSIS — R7303 Prediabetes: Secondary | ICD-10-CM | POA: Diagnosis not present

## 2024-04-28 DIAGNOSIS — E785 Hyperlipidemia, unspecified: Secondary | ICD-10-CM

## 2024-04-28 DIAGNOSIS — B9561 Methicillin susceptible Staphylococcus aureus infection as the cause of diseases classified elsewhere: Secondary | ICD-10-CM | POA: Diagnosis not present

## 2024-04-28 DIAGNOSIS — L739 Follicular disorder, unspecified: Secondary | ICD-10-CM

## 2024-04-28 DIAGNOSIS — I10 Essential (primary) hypertension: Secondary | ICD-10-CM | POA: Diagnosis not present

## 2024-04-28 DIAGNOSIS — F3341 Major depressive disorder, recurrent, in partial remission: Secondary | ICD-10-CM | POA: Diagnosis not present

## 2024-04-28 MED ORDER — DULOXETINE HCL 60 MG PO CPEP: 60.0000 mg | ORAL_CAPSULE | Freq: Every day | ORAL | 1 refills | Status: AC

## 2024-04-28 MED ORDER — MUPIROCIN 2 % EX OINT: 1.0000 | TOPICAL_OINTMENT | Freq: Two times a day (BID) | CUTANEOUS | 0 refills | Status: AC

## 2024-04-28 MED ORDER — BUPROPION HCL ER (XL) 300 MG PO TB24: 300.0000 mg | ORAL_TABLET | Freq: Every day | ORAL | 1 refills | Status: AC

## 2024-04-28 NOTE — Progress Notes (Signed)
 "  Established Patient Office Visit  Subjective   Patient ID: Alyssa Clay, female    DOB: 1965-05-01  Age: 59 y.o. MRN: [id number]  Chief Complaint  Patient presents with   Medical Management of Chronic Issues    HPI Discussed the use of AI scribe software for clinical note transcription with the patient, who gave verbal consent to proceed.  History of Present Illness   Alyssa Clay is a 59 year old female who presents for an annual physical exam.  She has a migraine today, which is a recurring problem managed by a specialist.  She feels anxious and nervous related to her upcoming move to [place] and the logistics of selling and buying a home. She describes this as situational stress.  She had a two-month illness starting in December with severe cough for about six weeks and marked worsening of asthma. It was neither flu nor COVID-19. Her asthma has improved and she continues Zetbam.  She takes Zepbound  and is satisfied with it. She lost about 10 pounds during the illness, regained some over the holidays, and now weighs 192 pounds, her lowest recorded weight.  She takes Wellbutrin  and Cymbalta  with recent refills.  She is concerned about a possible staph infection in her nose. She has burny, painful symptoms, mainly in the right nostril, which looks more inflamed than the left. She has had similar episodes before and previously used mupirocin  ointment.  She maintains regular bowel movements using fiber and Miralax  and is concerned about constipation from her medications.  She is busy preparing for her move and is in the process of purging her house.       Current Outpatient Medications  Medication Instructions   albuterol  (PROVENTIL ) 2.5 mg, Nebulization, Every 4 hours PRN   albuterol  (PROVENTIL ) 2.5 mg, Nebulization, Every 6 hours PRN   albuterol  (VENTOLIN  HFA) 108 (90 Base) MCG/ACT inhaler 2 puffs, Inhalation, Every 4 hours PRN   benzonatate  (TESSALON ) 100 mg, Oral,  Every 8 hours   BREO ELLIPTA  200-25 MCG/ACT AEPB INHALE 1 INHALATION BY MOUTH  INTO THE LUNGS DAILY RINSE MOUTH AFTER EACH USE   budesonide  (PULMICORT ) 0.5 mg, Nebulization, 3 times daily, Take for 1-2 weeks during asthma flare.   buPROPion  (WELLBUTRIN  XL) 300 mg, Oral, Daily   celecoxib (CELEBREX) 200 MG capsule TAKE 1 CAPSULE BY MOUTH DAILY AS NEEDED . (REPLACES MELOXICAM )   DULoxetine  (CYMBALTA ) 60 mg, Oral, Daily   Estradiol  (YUVAFEM ) 10 mcg, Vaginal, Daily   ipratropium (ATROVENT ) 0.03 % nasal spray 1-2 sprays, Each Nare, 2 times daily PRN   Multiple Vitamin (MULTIVITAMIN WITH MINERALS) TABS tablet 1 tablet, Daily   mupirocin  ointment (BACTROBAN ) 2 % 1 Application, Topical, 2 times daily   nystatin  (MYCOSTATIN ) 500,000 Units, Oral, 4 times daily, Swish and swallow.   ondansetron  (ZOFRAN ) 4 mg, Oral, Every 8 hours PRN   Respiratory Therapy Supplies (FLUTTER) DEVI Use 3 times daily as directed   Reyvow  100 mg, Oral, Daily PRN   rizatriptan  (MAXALT ) 10 mg, Oral, As needed, May repeat in 2 hours if needed.  Maximum 2 tablets in 24 hours.   Spacer/Aero-Holding Chambers (AEROCHAMBER MV) inhaler Use as instructed   Tezspire  210 mg, Subcutaneous, Every 28 days   tirzepatide  (ZEPBOUND ) 15 MG/0.5ML Pen INJECT THE CONTENTS OF ONE PEN  SUBCUTANEOUSLY WEEKLY AS  DIRECTED   traZODone  (DESYREL ) 100-200 mg, Oral, At bedtime PRN, for sleep   tretinoin (RETIN-A) 0.05 % cream Apply topically.   valACYclovir  (VALTREX ) 1,000 mg, Oral,  2 times daily    Patient Active Problem List   Diagnosis Date Noted   Vertigo 09/05/2022   Acute viral pharyngitis 06/06/2022   Acute left otitis media 02/21/2022   Recurrent infections 11/14/2021   Vaginal yeast infection 11/14/2021   Rash and other nonspecific skin eruption 11/14/2021   Class 2 obesity with body mass index (BMI) of 38.0 to 38.9 in adult 10/19/2021   History of COVID-19 09/12/2021   Chronic migraine without aura without status migrainosus, not  intractable 05/26/2021   Lumbar radiculopathy, right 10/08/2020   SI (sacroiliac) joint dysfunction 09/16/2020   Somatic dysfunction of spine, sacral 09/16/2020   OSA (obstructive sleep apnea) 12/18/2019   FUO (fever of unknown origin) 10/21/2019   S/P bilateral breast reduction 06/19/2019   Back pain 04/15/2019   Neck pain 04/15/2019   History of frequent upper respiratory infection 01/16/2019   Atelectasis 01/16/2019   Severe persistent asthma without complication (HCC) 01/16/2019   Shortness of breath 01/01/2019   Primary osteoarthritis of both knees 07/10/2018   Hypertension 03/27/2018   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     Objective:     BP 98/60   Pulse 100   Temp 98 F (36.7 C) (Oral)   Ht 5' 5 (1.651 m)   Wt 192 lb 6.4 oz (87.3 kg)   LMP 12/20/2018   SpO2 98%   BMI 32.02 kg/m    Physical Exam Vitals reviewed.  HENT:     Nose: Rhinorrhea present.  Cardiovascular:     Rate and Rhythm: Normal rate and regular rhythm.     Pulses: Normal pulses.  Abdominal:     General: Bowel sounds are normal.  Musculoskeletal:     Right lower leg: No edema.     Left lower leg: No edema.  Neurological:     Mental Status: She is oriented to person, place, and time.  Psychiatric:        Mood and Affect: Mood normal.        Behavior: Behavior normal.      No results found for any visits on 04/28/24.    The 10-year ASCVD risk score (Arnett DK, et al., 2019) is: 1.4%    Assessment & Plan:  Primary hypertension -     Comprehensive metabolic panel with GFR; Future  Recurrent major depressive disorder, in partial remission -      buPROPion  HCl ER (XL); Take 1 tablet (300 mg total) by mouth daily.  Dispense: 90 tablet; Refill: 1 -     DULoxetine  HCl; Take 1 capsule (60 mg total) by mouth daily.  Dispense: 90 capsule; Refill: 1  Borderline hyperlipidemia -     Lipid panel; Future  Prediabetes -     Hemoglobin A1c; Future  Staphylococcus aureus superficial folliculitis -     Mupirocin ; Apply 1 Application topically 2 (two) times daily.  Dispense: 22 g; Refill: 0   Assessment and Plan    Staphylococcal nasal folliculitis Right nostril more inflamed than left, with significant pain and burning sensation. Likely due to prolonged nasal irritation from frequent blowing. - Prescribed mupirocin  ointment, topical application twice daily - Advised to reduce frequency of application as  symptoms improve  Major depressive disorder, recurrent, in partial remission Currently managed with Wellbutrin  and Cymbalta . Reports situational anxiety related to upcoming move to [place]. - Continue Wellbutrin  and Cymbalta  - Ensured refills are available for at least six months  Primary hypertension Blood pressure is well controlled on current regimen. - Continue current antihypertensive regimen  Borderline hyperlipidemia Cholesterol levels to be monitored. Labs were last done in March, but will be ordered early due to potential move. - Ordered cholesterol test early  General health maintenance Up to date on Pap, colonoscopy, tetanus shot, mammogram, and flu shot. Labs for cholesterol to be ordered early due to potential move. - Ordered cholesterol test early - Ensured all medications have refills for at least six months        No follow-ups on file.    Heron CHRISTELLA Sharper, MD  "

## 2024-04-30 ENCOUNTER — Encounter: Payer: Self-pay | Admitting: Family Medicine

## 2024-04-30 NOTE — Progress Notes (Signed)
 "  NEUROLOGY FOLLOW UP OFFICE NOTE  Alyssa Clay [id number]  Assessment/Plan:   Migraine without aura, without status migrainosus, not intractable Right vestibular neuritis, resolved   Migraine prevention:  Vyepti  to 300mg  (at end of this month).  Preemptively take a Reyvow  prior to Vyepti  infusion to prevent post infusion migraine. Migraine rescue:  Reyvow  or rizatriptan .  Since rizatriptan  is effective only 50% of the time, will have her try samples of Symbravo to see if it is more effective.  Zofran  for nausea Keep headache diary No follow up as she will be moving within the next 2 months. Will refill medications prior to move.   Total time spent in chart and face to face with patient:  32 minutes.  Subjective:  Alyssa Clay is a 59 year old female with asthma who follows up for migraine.    UPDATE: Migraines: Last Vyepti  infusion was about 2 months ago.  Taking Reyvow  preemptively prior to infusion was helpful.  She still had a migraine afterward, but intensity much less severe.  They occur 1 to 2 days a week (or 5 to 8 days a month) which is improved (previously 5 days a week).  Lasts 3 to 5 hours with rizatriptan  when it works (which is about 50% of the time).  Otherwise, lasts all day.  Severity improves with Reyvow  although residual headache lasts until next day.    She has been under a great deal of emotional stress which has likely contributed to recent migraines.  She was sick with the flu last month, with prolonged recovery.  She and her husband will be moving to [place] in the next couple of months for his job.  Although this is exciting, it has been stressful as well.    Medications: Frequency of abortive medication: as needed Current NSAIDS/analgesics:  none Current triptans:  none Current ergotamine: none Current anti-emetic:  Zofran  4mg  Current muscle relaxants:  none Current Antihypertensive medications:  none Current Antidepressant medications:  Cymbalta   60mg  daily, Wellbutrin , trazodone  Current Anticonvulsant medications:  none Current anti-CGRP:  Vypeti 300mg  Current Vitamins/Herbal/Supplements:  none Current Antihistamines/Decongestants:  none Other therapy:  Reyvow  100mg ; acupuncture birth control/hormone:  estradiol  Other treatment:  physical therapy/neck stretching, dry needling, massage, yoga, gym, mouth guard     HISTORY: Migraines: Onset:  11 or 59 years old with onset of menses menstrual until 70 and then more often.  Pregnant at 27 -  Location:  frontal, temporal, top of head bilaterally, now often right periorbital, right jaw  Quality:  squeezing, stabbing Intensity:  moderate to severe Aura:  sometimes zigzag pattern in visioin Prodrome:  absent Associated symptoms:  Photophobia, phonophobia, osmophobia.  She denies associated nausea, vomiting, autonomic symptoms, unilateral numbness or weakness. Duration:  1 to 3 days Frequency:  5 to 12 a month Triggers:  bright lights, stress Relieving factors:  rest, dark room Activity:  aggravates   MRI of brain without contrast on 10/14/2012 was normal     Vertigo - suspect right vestibular neuritis: In early April 2024, she developed a viral ilness, including low-grade fever, sore throat, and headache.  A couple of days, she developed dizziness, described as a spinning sensation triggered by change in position.  Labs for COVID, flu and strep A were negative.  She was prescribed meclizine  and given instructions for home Epley maneuver which were ineffective.  She saw PT.  Exam revealed right vestibular hypofunction, including positive right Head Impulse Test.  She went to two sessions of  vestibular rehab with PT which were not helpful.  Always experiences sensation of dizziness but dull when still, aggravated with movement.  Associated nausea.  Denies aural fullness, otalgia, tinnitus or hearing loss.  She saw ENT (Dr. Herminio) in July 2024.  Nasal endoscopy was unremarkable.  VNG  performed on 11/21/2022 showed abnormal saccades and smooth pursuit but she did not demonstrate nystagmus or subjective dizziness and optokinetic testing, positional testing and calorics were normal.  Impression:  A central pathology cannot be ruled out based on today's abnormal test findings:  abnormal smooth pursuit and saccades.SABRASABRAConsidering her improved symptoms, precipitous event to dizziness and normal calorics, a compensated peripheral pathology is likely.  There was no indication of BPPV.  MRI of brain/IAC with and without contrast on 11/24/2022 personally reviewed was normal.  Symptoms subsequently resolved.   Past medications: Past NSAIDS/analgesics:  ibuprofen , Tylenol , naproxen , Fioricet, Codeine , darvocet, Norco, Tylenol  #3, tramadol, Midrin, toradol , Elyxyb Past abortive triptans:  Amerge, Frova, Relpax, sumatriptan  tab/[place], Maxalt , Zomig, Treximet Past abortive ergotamine: Trudhesa Past muscle relaxants:  tizanidine, skelaxin, Flexeril Past anti-emetic:  promethazine , Compazine Past antihypertensive medications:  propranolol, atenolol, verapamil, metoprolol Past antidepressant medications:  amitriptyline, sertraline, Lexapro, Celexa, Prozac Past anticonvulsant medications:  topiramate, Depakote, zonisamide, gabapentin  Past anti-CGRP:  Qulipta, Ubrelvy, Nurtec, Aimovig, Zavzpret Past vitamins/Herbal/Supplements:  Migrelief BID Past antihistamines/decongestants:  none Other past therapies:  Botox (15 years ago, lost efficacy), sphenopalatine ganglion block, trigger point injections, acupuncture  PAST MEDICAL HISTORY: Past Medical History:  Diagnosis Date   Allergy    Anemia    Asthma    Constipation    issues x the past month -? 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 75 mL 1   albuterol  (PROVENTIL ) (2.5 MG/3ML) 0.083% nebulizer solution Take 3 mLs (2.5 mg total) by nebulization every 6 (six) hours as needed for wheezing or shortness of breath. 120 mL 1   albuterol  (VENTOLIN  HFA) 108 (90 Base) MCG/ACT inhaler Inhale 2 puffs into the lungs every 4 (four) hours as needed for wheezing or shortness of breath (coughing fits). 18 g 1   benzonatate  (TESSALON ) 100 MG capsule Take 1 capsule (100 mg total) by mouth every 8 (eight) hours. 21 capsule 0   BREO ELLIPTA  200-25 MCG/ACT AEPB INHALE 1 INHALATION BY MOUTH  INTO THE LUNGS DAILY RINSE MOUTH AFTER EACH USE 180 each 3   budesonide  (PULMICORT ) 0.5 MG/2ML nebulizer solution Take 2 mLs (0.5 mg total) by nebulization 3 (three) times daily. Take for 1-2 weeks during asthma flare. 180 mL 2   buPROPion  (WELLBUTRIN  XL) 300 MG 24 hr tablet Take 1 tablet (300 mg total) by mouth daily. 90 tablet 1   celecoxib (CELEBREX) 200 MG capsule TAKE 1 CAPSULE BY MOUTH DAILY AS NEEDED . (REPLACES MELOXICAM )     DULoxetine  (CYMBALTA ) 60 MG capsule Take 1 capsule (60 mg total) by mouth daily.  90 capsule 1   Estradiol  (YUVAFEM ) 10 MCG TABS vaginal tablet Place 1 tablet (10 mcg total) vaginally daily. 8 tablet 0   ipratropium (ATROVENT ) 0.03 % nasal spray Place 1-2 sprays into both nostrils 2 (two) times daily as needed (nasal drainage). 30 mL 5   Lasmiditan  Succinate (REYVOW ) 100 MG TABS Take 1 tablet (100 mg total) by mouth daily as needed. 4 tablet 1   Multiple Vitamin (MULTIVITAMIN WITH MINERALS) TABS tablet Take 1 tablet by mouth daily.     mupirocin  ointment (BACTROBAN ) 2 % Apply 1 Application  topically 2 (two) times daily. 22 g 0   nystatin  (MYCOSTATIN ) 100000 UNIT/ML suspension Take 5 mLs (500,000 Units total) by mouth 4 (four) times daily. Swish and swallow. 60 mL 0   ondansetron  (ZOFRAN ) 4 MG tablet Take 1 tablet (4 mg total) by mouth every 8 (eight) hours as needed for nausea or vomiting. 30 tablet 1   Respiratory Therapy Supplies (FLUTTER) DEVI Use 3 times daily as directed 1 each 0   rizatriptan  (MAXALT ) 10 MG tablet Take 1 tablet (10 mg total) by mouth as needed for migraine. May repeat in 2 hours if needed.  Maximum 2 tablets in 24 hours. 10 tablet 5   Spacer/Aero-Holding Chambers (AEROCHAMBER MV) inhaler Use as instructed 1 each 1   Tezepelumab -ekko (TEZSPIRE ) 210 MG/1. SOAJ Inject 210 mg into the skin every 28 (twenty-eight) days. 1.91 mL 11   tirzepatide  (ZEPBOUND ) 15 MG/0.5ML Pen INJECT THE CONTENTS OF ONE PEN  SUBCUTANEOUSLY WEEKLY AS  DIRECTED 6 mL 1   traZODone  (DESYREL ) 100 MG tablet TAKE 1 TO 2 TABLETS BY MOUTH AT  BEDTIME AS NEEDED FOR SLEEP 90 tablet 7   tretinoin (RETIN-A) 0.05 % cream Apply topically.     valACYclovir  (VALTREX ) 1000 MG tablet Take 1 tablet (1,000 mg total) by mouth 2 (two) times daily. 14 tablet 5   Current Facility-Administered Medications on File Prior to Visit  Medication Dose Route Frequency Provider Last Rate Last Admin   tezepelumab -ekko (TEZSPIRE ) 210 MG/1. syringe 210 mg  210 mg Subcutaneous Q28 days Luke Orlan HERO, DO   210 mg at 08/19/21 1138    ALLERGIES: Allergies  Allergen Reactions   Penicillins Hives and Other (See Comments)    Yeast infections Yeast infections   Pt wants listed as an allergy    Zonisamide Hives    FAMILY HISTORY: Family History  Problem Relation Age of Onset   Depression Mother    Lymphoma Mother 10   Lung cancer Mother    Hypertension Father    Heart attack Father 20   Hypertension Brother    High blood pressure Brother    High Cholesterol Brother    Healthy Daughter    Colon cancer  Maternal Aunt 78   Esophageal cancer Neg Hx    Pancreatic cancer Neg Hx    Stomach cancer Neg Hx    Colon polyps Neg Hx    Rectal cancer Neg Hx       Objective:  Blood pressure 136/63, pulse (!) 101, height 5' 5 (1.651 m), weight 195 lb 12.8 oz (88.8 kg), last menstrual period 12/20/2018, SpO2 94%. General: No acute distress.  Patient appears well-groomed.   Head:  Normocephalic/atraumatic Eyes:  Fundi examined but not visualized Neck: supple, no paraspinal tenderness, full range of motion Heart:  Regular rate and rhythm Neurological Exam: alert and oriented.  Speech fluent and not dysarthric, language intact.  CN II-XII intact. Bulk and tone normal, muscle strength  5/5 throughout.  Sensation to light touch intact.  Deep tendon reflexes 2+ throughout, toes downgoing.  Finger to nose testing intact.  Gait normal, Romberg negative.   Juliene Dunnings, DO  CC: Heron Sharper, MD       "

## 2024-05-01 ENCOUNTER — Other Ambulatory Visit (HOSPITAL_COMMUNITY): Payer: Self-pay

## 2024-05-01 ENCOUNTER — Ambulatory Visit (INDEPENDENT_AMBULATORY_CARE_PROVIDER_SITE_OTHER): Payer: Self-pay | Admitting: Neurology

## 2024-05-01 ENCOUNTER — Telehealth: Payer: Self-pay

## 2024-05-01 ENCOUNTER — Telehealth: Payer: Self-pay | Admitting: Pharmacy Technician

## 2024-05-01 ENCOUNTER — Encounter: Payer: Self-pay | Admitting: Neurology

## 2024-05-01 VITALS — BP 136/63 | HR 101 | Ht 65.0 in | Wt 195.8 lb

## 2024-05-01 DIAGNOSIS — G43009 Migraine without aura, not intractable, without status migrainosus: Secondary | ICD-10-CM

## 2024-05-01 NOTE — Telephone Encounter (Signed)
 PA has been submitted, and telephone encounter has been created. Please see telephone encounter dated 1.8.26.

## 2024-05-01 NOTE — Patient Instructions (Signed)
 Vyepti  300mg  every 3 months Take Reyvow  prior to Vyepti  infusion.   Instead of rizatriptan , try Symbravo (which is rizatriptan  with Mobic ) - one in 24 hours.  Give me update.

## 2024-05-01 NOTE — Telephone Encounter (Signed)
 Pharmacy Patient Advocate Encounter   Received notification from Pt Calls Messages that prior authorization for REYVOW  100MG  is required/requested.   Insurance verification completed.   The patient is insured through [facility].   Per test claim: PA required; PA submitted to above mentioned insurance via Latent Key/confirmation #/EOC [id number] Status is pending

## 2024-05-01 NOTE — Telephone Encounter (Signed)
 Per patient pharmacy states a Pa is needed for Reyvow .

## 2024-05-02 ENCOUNTER — Other Ambulatory Visit

## 2024-05-07 ENCOUNTER — Ambulatory Visit: Admitting: Allergy

## 2024-05-11 ENCOUNTER — Other Ambulatory Visit: Payer: Self-pay | Admitting: Allergy

## 2024-05-19 ENCOUNTER — Ambulatory Visit

## 2024-05-28 ENCOUNTER — Ambulatory Visit (INDEPENDENT_AMBULATORY_CARE_PROVIDER_SITE_OTHER)

## 2024-05-28 VITALS — BP 143/84 | HR 79 | Temp 98.4°F | Resp 18 | Ht 65.0 in | Wt 200.2 lb

## 2024-05-28 DIAGNOSIS — G43709 Chronic migraine without aura, not intractable, without status migrainosus: Secondary | ICD-10-CM | POA: Diagnosis not present

## 2024-05-28 MED ORDER — SODIUM CHLORIDE 0.9 % IV SOLN
300.0000 mg | Freq: Once | INTRAVENOUS | Status: AC
  Administered 2024-05-28: 300 mg via INTRAVENOUS
  Filled 2024-05-28: qty 3

## 2024-05-28 NOTE — Progress Notes (Signed)
 Diagnosis: Chronic Migraine  Provider:  Mannam, Praveen MD  Procedure: IV Infusion  IV Type: Peripheral, IV Location: L Forearm  Vyepti  (Eptinezumab -jjmr), Dose: 300 mg  Infusion Start Time: 0946  Infusion Stop Time: 1022  Post Infusion IV Care: Peripheral IV Discontinued  Discharge: Condition: Good, Destination: Home . AVS Declined  Performed by:  Zyron Deeley, RN

## 2024-08-27 ENCOUNTER — Ambulatory Visit
